# Patient Record
Sex: Female | Born: 1980 | Race: Black or African American | Hispanic: No
Health system: Southern US, Community
[De-identification: ages and names within clinical notes are randomized; demographics above are authoritative.]

## PROBLEM LIST (undated history)

## (undated) ENCOUNTER — Emergency Department (HOSPITAL_COMMUNITY): Admission: EM | Payer: Self-pay | Source: Home / Self Care

## (undated) ENCOUNTER — Ambulatory Visit (HOSPITAL_COMMUNITY): Admission: EM | Payer: Medicare Other

## (undated) ENCOUNTER — Emergency Department (HOSPITAL_COMMUNITY): Admission: EM | Discharge status: Absent without leave | Payer: Self-pay

## (undated) DIAGNOSIS — E119 Type 2 diabetes mellitus without complications: Secondary | ICD-10-CM

## (undated) DIAGNOSIS — F3111 Bipolar disorder, current episode manic without psychotic features, mild: Secondary | ICD-10-CM

## (undated) DIAGNOSIS — F209 Schizophrenia, unspecified: Secondary | ICD-10-CM

## (undated) HISTORY — PX: WISDOM TOOTH EXTRACTION: SHX21

---

## 2014-06-30 ENCOUNTER — Emergency Department (HOSPITAL_COMMUNITY)
Admission: EM | Admit: 2014-06-30 | Discharge: 2014-07-06 | Disposition: A | Discharge status: Other Acute Inpatient Hospital | Payer: Medicare Other | Attending: Emergency Medicine | Admitting: Emergency Medicine

## 2014-06-30 ENCOUNTER — Encounter (HOSPITAL_COMMUNITY): Payer: Self-pay | Admitting: Nurse Practitioner

## 2014-06-30 DIAGNOSIS — F209 Schizophrenia, unspecified: Secondary | ICD-10-CM | POA: Diagnosis not present

## 2014-06-30 DIAGNOSIS — E119 Type 2 diabetes mellitus without complications: Secondary | ICD-10-CM | POA: Diagnosis not present

## 2014-06-30 DIAGNOSIS — F259 Schizoaffective disorder, unspecified: Secondary | ICD-10-CM | POA: Diagnosis present

## 2014-06-30 DIAGNOSIS — Z3202 Encounter for pregnancy test, result negative: Secondary | ICD-10-CM | POA: Diagnosis not present

## 2014-06-30 DIAGNOSIS — R443 Hallucinations, unspecified: Secondary | ICD-10-CM

## 2014-06-30 DIAGNOSIS — Z9119 Patient's noncompliance with other medical treatment and regimen: Secondary | ICD-10-CM | POA: Insufficient documentation

## 2014-06-30 DIAGNOSIS — Z79899 Other long term (current) drug therapy: Secondary | ICD-10-CM | POA: Insufficient documentation

## 2014-06-30 DIAGNOSIS — F29 Unspecified psychosis not due to a substance or known physiological condition: Secondary | ICD-10-CM | POA: Diagnosis present

## 2014-06-30 DIAGNOSIS — F3111 Bipolar disorder, current episode manic without psychotic features, mild: Secondary | ICD-10-CM | POA: Insufficient documentation

## 2014-06-30 DIAGNOSIS — Z9114 Patient's other noncompliance with medication regimen: Secondary | ICD-10-CM

## 2014-06-30 HISTORY — DX: Schizophrenia, unspecified: F20.9

## 2014-06-30 HISTORY — DX: Type 2 diabetes mellitus without complications: E11.9

## 2014-06-30 HISTORY — DX: Bipolar disorder, current episode manic without psychotic features, mild: F31.11

## 2014-06-30 LAB — COMPREHENSIVE METABOLIC PANEL
ALBUMIN: 4.3 g/dL (ref 3.5–5.0)
ALT: 13 U/L — AB (ref 14–54)
ANION GAP: 10 (ref 5–15)
AST: 17 U/L (ref 15–41)
Alkaline Phosphatase: 48 U/L (ref 38–126)
BILIRUBIN TOTAL: 0.5 mg/dL (ref 0.3–1.2)
BUN: 6 mg/dL (ref 6–20)
CHLORIDE: 103 mmol/L (ref 101–111)
CO2: 27 mmol/L (ref 22–32)
Calcium: 9.4 mg/dL (ref 8.9–10.3)
Creatinine, Ser: 0.97 mg/dL (ref 0.44–1.00)
GFR calc Af Amer: >60 mL/min (ref >60)
GFR calc non Af Amer: >60 mL/min (ref >60)
Glucose, Bld: 121 mg/dL — ABNORMAL HIGH (ref 65–99)
Potassium: 3.2 mmol/L — ABNORMAL LOW (ref 3.5–5.1)
SODIUM: 140 mmol/L (ref 135–145)
Total Protein: 8 g/dL (ref 6.5–8.1)

## 2014-06-30 LAB — SALICYLATE LEVEL

## 2014-06-30 LAB — CBC
HEMATOCRIT: 37.3 % (ref 36.0–46.0)
HEMOGLOBIN: 12.6 g/dL (ref 12.0–15.0)
MCH: 31.3 pg (ref 26.0–34.0)
MCHC: 33.8 g/dL (ref 30.0–36.0)
MCV: 92.8 fL (ref 78.0–100.0)
Platelets: 253 10*3/uL (ref 150–400)
RBC: 4.02 MIL/uL (ref 3.87–5.11)
RDW: 13.4 % (ref 11.5–15.5)
WBC: 6.3 10*3/uL (ref 4.0–10.5)

## 2014-06-30 LAB — ETHANOL: Alcohol, Ethyl (B): <5 mg/dL (ref <5)

## 2014-06-30 LAB — RAPID URINE DRUG SCREEN, HOSP PERFORMED
Amphetamines: NOT DETECTED
BENZODIAZEPINES: NOT DETECTED
Barbiturates: NOT DETECTED
Cocaine: NOT DETECTED
Opiates: NOT DETECTED
Tetrahydrocannabinol: NOT DETECTED

## 2014-06-30 LAB — CBG MONITORING, ED: Glucose-Capillary: 105 mg/dL — ABNORMAL HIGH (ref 65–99)

## 2014-06-30 LAB — PREGNANCY, URINE: PREG TEST UR: NEGATIVE

## 2014-06-30 LAB — ACETAMINOPHEN LEVEL

## 2014-06-30 MED ORDER — DIPHENHYDRAMINE HCL 50 MG/ML IJ SOLN
25.0000 mg | Freq: Once | INTRAMUSCULAR | Status: AC
  Administered 2014-06-30: 25 mg via INTRAMUSCULAR
  Filled 2014-06-30: qty 1

## 2014-06-30 MED ORDER — ONDANSETRON HCL 4 MG PO TABS
4.0000 mg | ORAL_TABLET | Freq: Three times a day (TID) | ORAL | Status: DC | PRN
  Filled 2014-06-30: qty 1

## 2014-06-30 MED ORDER — DIPHENHYDRAMINE HCL 50 MG/ML IJ SOLN
INTRAMUSCULAR | Status: AC
  Filled 2014-06-30: qty 1

## 2014-06-30 MED ORDER — LORAZEPAM 1 MG PO TABS: 1.0000 mg | ORAL_TABLET | Freq: Once | ORAL | Status: AC

## 2014-06-30 MED ORDER — LORAZEPAM 2 MG/ML IJ SOLN
INTRAMUSCULAR | Status: AC
  Filled 2014-06-30: qty 1

## 2014-06-30 MED ORDER — HALOPERIDOL LACTATE 5 MG/ML IJ SOLN
INTRAMUSCULAR | Status: AC
  Filled 2014-06-30: qty 1

## 2014-06-30 MED ORDER — ZIPRASIDONE MESYLATE 20 MG IM SOLR
INTRAMUSCULAR | Status: AC
  Filled 2014-06-30: qty 20

## 2014-06-30 MED ORDER — ZIPRASIDONE MESYLATE 20 MG IM SOLR
10.0000 mg | Freq: Once | INTRAMUSCULAR | Status: AC
  Administered 2014-06-30: 10 mg via INTRAMUSCULAR

## 2014-06-30 MED ORDER — BENZTROPINE MESYLATE 1 MG PO TABS
0.5000 mg | ORAL_TABLET | Freq: Two times a day (BID) | ORAL | Status: DC
  Administered 2014-06-30 – 2014-07-04 (×8): 0.5 mg via ORAL
  Administered 2014-07-04: 1 mg via ORAL
  Administered 2014-07-05: 0.5 mg via ORAL
  Administered 2014-07-05: 1 mg via ORAL
  Filled 2014-06-30 (×11): qty 1

## 2014-06-30 MED ORDER — LORAZEPAM 1 MG PO TABS
1.0000 mg | ORAL_TABLET | Freq: Three times a day (TID) | ORAL | Status: DC | PRN
  Filled 2014-06-30: qty 1

## 2014-06-30 MED ORDER — ALUM & MAG HYDROXIDE-SIMETH 200-200-20 MG/5ML PO SUSP: 30.0000 mL | ORAL | Status: DC | PRN

## 2014-06-30 MED ORDER — LORAZEPAM 2 MG/ML IJ SOLN
1.0000 mg | Freq: Once | INTRAMUSCULAR | Status: AC
  Administered 2014-06-30: 1 mg via INTRAMUSCULAR

## 2014-06-30 NOTE — BH Assessment (Signed)
[place] Assessment Progress Note  At the request of Corena Pilgrim, MD, this writer attempted to call Darolyn Rua [phone]), pt's friend and pastor, to obtain collateral information.  Call was placed at 10:53 and rolled to voice mail.  A message was left.  Dr. Darleene Cleaver was notified.  Jalene Mullet, [place] Triage Specialist [phone]

## 2014-06-30 NOTE — BH Assessment (Addendum)
Tele Assessment Note   [name] [name] is an 34 y.o. female.  -Clinician discussed patient with Piepenbrink, PA regarding need for TTS.  Patient was brought in by [facility] and a friend of hers.  Patient is from [place] and is accompanied by her former Theme park manager.  Patient has been off medication for an undetermined period of time.  Was brought in to [facility] by [employer].  Patient is actively hallucinating.  Patient thought that hospital staff were part of [employer].  Clinician attempted several times to engage patient but she says only a few words that are barely above a whisper.  At one point I addressed her as "[name] [name]" and she said "I am [name] [name]."  Patient speaks so softly as to be incoherent at times.  Most of her responses are "I don't know or I don't remember."  Patient was not oriented at all.    Clinician attempted to contact Mr. Darolyn Rua [phone] whom she identified as her former Theme park manager.  Clinician only got the voice-mail for Mr. [name].  Patient did say yes when asked if she were ever in a psychiatric hospital before, did not provide any more information.  Patient is not able to make decisions for herself at this time.  Pt's UDS and BAL were negative.  When asked if she heard voices she did not respond.  When asked if she saw things she says, "I see things on the floor."  When asked for more information, she says "I can't tell you."  -Clinician discussed patient care with Arlester Marker, NP who recommends that patient be seen by psychiatry in AM.  Clinician called Dr. Dina Rich (EDP) and let her know of disposition.   Axis I: Schizoaffective Disorder Axis II: Deferred Axis III:  Past Medical History  Diagnosis Date  . Diabetes mellitus without complication   . Schizophrenia   . Bipolar affective disorder, currently manic, mild    Axis IV: economic problems, housing problems, other psychosocial or environmental problems, problems with access to health care services and problems with  primary support group Axis V: 21-30 behavior considerably influenced by delusions or hallucinations OR serious impairment in judgment, communication OR inability to function in almost all areas  Past Medical History:  Past Medical History  Diagnosis Date  . Diabetes mellitus without complication   . Schizophrenia   . Bipolar affective disorder, currently manic, mild     History reviewed. No pertinent past surgical history.  Family History: History reviewed. No pertinent family history.  Social History:  reports that she has never smoked. She does not have any smokeless tobacco history on file. She reports that she does not drink alcohol. Her drug history is not on file.  Additional Social History:  Alcohol / Drug Use Pain Medications: Pt does not remember medications. Prescriptions: Pt does not remember. Over the Counter: Unknown History of alcohol / drug use?: No history of alcohol / drug abuse (UDS and BAL are clear)  CIWA: CIWA-Ar BP: 134/78 mmHg Pulse Rate: 92 COWS:    PATIENT STRENGTHS: (choose at least two) Average or above average intelligence Physical Health  Allergies: No Known Allergies  Home Medications:  (Not in a hospital admission)  OB/GYN Status:  No LMP recorded (lmp unknown).  General Assessment Data [facility] of Assessment: [name] Assessment: In system Is this a Tele or Face-to-Face Assessment?: Face-to-Face Is this an Initial Assessment or a Re-assessment for this encounter?: Initial Assessment Marital status: Other (comment) (Pt answers, "I don't know.")  Is patient pregnant?: No Pregnancy Status: No Living Arrangements: Other (Comment) (Homeless in [place]) Can pt return to current living arrangement?: Yes Admission Status: Voluntary Is patient capable of signing voluntary admission?: No (Pt acutely psychotic) Referral Source: Other (Brought in by [employer] and a friend) Insurance type: self pay     Crisis Care Plan Living Arrangements:  Other (Comment) (Homeless in [place]) Name of Psychiatrist: None Name of Therapist: NOne  Education Status Is patient currently in school?: No  Risk to self with the past 6 months Suicidal Ideation: No Has patient been a risk to self within the past 6 months prior to admission? : No Suicidal Intent: No Has patient had any suicidal intent within the past 6 months prior to admission? : No Is patient at risk for suicide?: No Suicidal Plan?: No Has patient had any suicidal plan within the past 6 months prior to admission? : No Access to Means: No What has been your use of drugs/alcohol within the last 12 months?: Pt denies Previous Attempts/Gestures: No How many times?: 0 Other Self Harm Risks: None Triggers for Past Attempts: None known Intentional Self Injurious Behavior: None Family Suicide History: Unknown Recent stressful life event(s):  (Pt says, "I have no idea.") Persecutory voices/beliefs?: Yes Depression: Yes Depression Symptoms: Despondent, Isolating, Loss of interest in usual pleasures, Feeling worthless/self pity Substance abuse history and/or treatment for substance abuse?: No Suicide prevention information given to non-admitted patients: Not applicable  Risk to Others within the past 6 months Homicidal Ideation: No Does patient have any lifetime risk of violence toward others beyond the six months prior to admission? : No Thoughts of Harm to Others: No Current Homicidal Intent: No Current Homicidal Plan: No Access to Homicidal Means: No Identified Victim: No one History of harm to others?: No Assessment of Violence: None Noted Violent Behavior Description: Hx unknown Does patient have access to weapons?: No Criminal Charges Pending?: No Does patient have a court date: No Is patient on probation?: No  Psychosis Hallucinations: Auditory, Visual (Pt hears voices and "sees things on the floor.") Delusions: None noted  Mental Status  Report Appearance/Hygiene: Disheveled, Poor hygiene, In scrubs Eye Contact: Poor Motor Activity: Freedom of movement, Unremarkable Speech: Soft, Slow, Incoherent Level of Consciousness: Quiet/awake Mood: Depressed, Despair, Helpless, Sad Affect: Blunted, Sad Anxiety Level: None Thought Processes: Irrelevant, Flight of Ideas Judgement: Impaired Orientation: Not oriented Obsessive Compulsive Thoughts/Behaviors: Unable to Assess  Cognitive Functioning Concentration: Unable to Assess Memory: Unable to Assess IQ: Average Insight: Poor Impulse Control: Unable to Assess Appetite: Poor Weight Loss: 0 Weight Gain: 0 Sleep: Unable to Assess Total Hours of Sleep: 0 Vegetative Symptoms: Staying in bed  ADLScreening [facility] Assessment Services) Patient's cognitive ability adequate to safely complete daily activities?: Yes Patient able to express need for assistance with ADLs?: Yes Independently performs ADLs?: Yes (appropriate for developmental age)  Prior Inpatient Therapy Prior Inpatient Therapy: Yes Prior Therapy Dates: Unknown Prior Therapy Facilty/Provider(s): Unknown Reason for Treatment: Unknown  Prior Outpatient Therapy Prior Outpatient Therapy: Yes Prior Therapy Dates: Unknown Prior Therapy Facilty/Provider(s): Unknown Reason for Treatment: Unknown Does patient have an ACCT team?: No Does patient have Intensive In-House Services?  : No Does patient have [facility] services? : No Does patient have P4CC services?: No  ADL Screening (condition at time of admission) Patient's cognitive ability adequate to safely complete daily activities?: Yes Is the patient deaf or have difficulty hearing?: No Does the patient have difficulty seeing, even when wearing glasses/contacts?: No Does the patient have  difficulty concentrating, remembering, or making decisions?: Yes Patient able to express need for assistance with ADLs?: Yes Does the patient have difficulty dressing or bathing?:  No Independently performs ADLs?: Yes (appropriate for developmental age) Does the patient have difficulty walking or climbing stairs?: No Weakness of Legs: None Weakness of Arms/Hands: None       Abuse/Neglect Assessment (Assessment to be complete while patient is alone) Physical Abuse: Denies (Pt does not answer) Verbal Abuse: Denies (Pt does not answer) Sexual Abuse: Denies (Pt does not answer) Exploitation of patient/patient's resources: Denies Self-Neglect: Denies     Regulatory affairs officer (For Healthcare) Does patient have an advance directive?: No Would patient like information on creating an advanced directive?: No - patient declined information    Additional Information 1:1 In Past 12 Months?: No CIRT Risk: No Elopement Risk: No Does patient have medical clearance?: Yes     Disposition:  Disposition Initial Assessment Completed for this Encounter: Yes Disposition of Patient: Inpatient treatment program, Referred to Type of inpatient treatment program: Adult Patient referred to:  (To consult with NP)  Curlene Dolphin Ray 06/30/2014 6:13 AM

## 2014-06-30 NOTE — Care Management Note (Signed)
Case Management Note  Patient Details  Name: Dechelle Attaway MRN: [id number] Date of Birth: [birth year]  Subjective/Objective:      34 yr old self pay patient with address of [place] in [place] with [name] to start new church Pt sitting on her bed with legs crossed Ple[place] and cooperative with CM during interaction but noted to question her answers to CM after CM asked her to spell the stated pcp name States pcp is "Lenda Kelp"  Pt states pcp is "muslim" Pt appreciative of resources offered    Action/Plan: CM noted pt from [facility] in [place] with no pcp for f/u services after ED visit CM spoke with pt Cm provided resources for self pay [place] patient in case she remains in [place] discussed and provided written information for self pay pcps, discussed the importance of pcp vs EDP services for f/u care, www.needymeds.org, www.goodrx.com, discounted pharmacies and other [employer] such as [employer] [facility] [employer], affordable care act,  [place] med assist, financial assistance, self pay dental services, [place] med assist, DSS and  health department  Reviewed resources for [employer] self pay pcps like Jinny Blossom, family medicine at [employer], [facility] [place] [facility], [facility], local urgent care centers, [facility], [facility] family practice, general medical clinics, family services of the [place], [facility] urgent care plus others, medication resources, [facility] out patient pharmacies and housing Pt voiced understanding and appreciation of resources provided   Provided P4CC contact information Pt not a candidate for [facility]   Expected Discharge Date:   Pending                Expected Discharge Plan:    Pending  In-House Referral:   [facility] team  Discharge planning Services   community resources   Status of Service:   completed    Additional Comments:  Robbie Lis, RN 06/30/2014, 11:12 AM

## 2014-06-30 NOTE — BH Assessment (Signed)
[place] Assessment Progress Note  IVC paperwork has been faxed to [facility]  At 16:35 Lelon Frohlich confirmed receipt.  Jalene Mullet, [place] Triage Specialist [phone]

## 2014-06-30 NOTE — ED Notes (Signed)
Pt is presented by [employer] and caregiver who introduces himself as pastor to the pt's church and states he [employer] to scene at a [employer] after pt started "acting out." At this triage moment, pt is reporting her feet burning, people around being hurt and also having conversation with people who she states she can't identify. Caregiver reports pt has "long hx" of bipolar maniac disorder and Schizophrenia and adds that pt has not been taking her Rx medication. Of note pt and caregiver are from [facility] and state they are here to "open a church." Pt is denies voices telling her to hurt herself or anyone else.

## 2014-06-30 NOTE — BH Assessment (Signed)
[facility] Assessment Progress Note  The following facilities have been contacted to seek placement for this pt, with results as noted:  Beds available, information sent, decision pending:  [name]   At capacity:  [facility]   Catlin Doria, MA Triage Specialist [phone]     

## 2014-06-30 NOTE — BH Assessment (Addendum)
[place] Assessment Progress Note 06/30/2014-referred to [facility] with priority wt list request. Authorization # is [id number]. Gershon Mussel will fax IVC when complete.  Robinette confirmed receipt of referral, and completed clinicals. RN will CB if we are missing anything.

## 2014-06-30 NOTE — ED Provider Notes (Signed)
CSN: [id number]     Arrival date & time 06/30/14  0003 History   First MD Initiated Contact with Patient 06/30/14 0029     Chief Complaint  Patient presents with  . Hallucinations    Tactile, Auditory and Sensory     (Consider location/radiation/quality/duration/timing/severity/associated sxs/prior Treatment) HPI Comments: Pt is presented by [employer] and caregiver who introduces himself as pastor to the pt's church and states he [employer] to scene at a [employer] after pt started "acting out." Patient reporting people around being hurt and also having conversation with people who she states she can't identify. Caregiver reports pt has "long hx" of bipolar maniac disorder and Schizophrenia and adds that pt has not been taking her Rx medication. Of note pt and caregiver are from [facility] and state they are here to "open a church." Pt is denies voices telling her to hurt herself or anyone else. Patient is a level V caveat d/t psychiatric disorder.    Past Medical History  Diagnosis Date  . Diabetes mellitus without complication   . Schizophrenia   . Bipolar affective disorder, currently manic, mild    History reviewed. No pertinent past surgical history. History reviewed. No pertinent family history. History  Substance Use Topics  . Smoking status: Never Smoker   . Smokeless tobacco: Not on file  . Alcohol Use: No   OB History    No data available     Review of Systems  Unable to perform ROS: Psychiatric disorder      Allergies  Review of patient's allergies indicates no known allergies.  Home Medications   Prior to Admission medications   Medication Sig Start Date End Date Taking? Authorizing Provider  traZODone (DESYREL) 100 MG tablet Take 100 mg by mouth at bedtime.   Yes Historical Provider, MD  ziprasidone (GEODON) 20 MG capsule Take 20 mg by mouth 2 (two) times daily with a meal.   Yes Historical Provider, MD   BP 117/102 mmHg  Pulse 111  Temp(Src) 98.1 F (36.7 C)  (Oral)  Resp 18  SpO2 100%  LMP  (LMP Unknown) Physical Exam  Constitutional: She appears well-developed and well-nourished. No distress.  HENT:  Head: Normocephalic and atraumatic.  Right Ear: External ear normal.  Left Ear: External ear normal.  Nose: Nose normal.  Mouth/Throat: Oropharynx is clear and moist.  Eyes: Conjunctivae are normal.  Neck: Normal range of motion. Neck supple.  No nuchal rigidity.   Cardiovascular: Normal rate, regular rhythm and normal heart sounds.   Pulmonary/Chest: Effort normal and breath sounds normal.  Abdominal: Soft. There is no tenderness.  Musculoskeletal: Normal range of motion.  Neurological: She is alert.  Skin: Skin is warm and dry. She is not diaphoretic.  Psychiatric: She has a normal mood and affect. Her speech is tangential. Thought content is paranoid. She is inattentive.  Nursing note and vitals reviewed.   ED Course  Procedures (including critical care time) Labs Review Labs Reviewed  ACETAMINOPHEN LEVEL - Abnormal; Notable for the following:    Acetaminophen (Tylenol), Serum <10 (*)    All other components within normal limits  COMPREHENSIVE METABOLIC PANEL - Abnormal; Notable for the following:    Potassium 3.2 (*)    Glucose, Bld 121 (*)    ALT 13 (*)    All other components within normal limits  CBC  ETHANOL  SALICYLATE LEVEL  URINE RAPID DRUG SCREEN, HOSP PERFORMED  PREGNANCY, URINE    Imaging Review No results found.   EKG  Interpretation None      MDM   Final diagnoses:  Hallucinations  Non compliance w medication regimen    Filed Vitals:   06/30/14 0010  BP: 117/102  Pulse: 111  Temp: 98.1 F (36.7 C)  Resp: 18   I have reviewed nursing notes, vital signs, and all appropriate lab and imaging results if ordered as above.   Patient presenting to the emergency department with caregiver for evaluation of hallucinations with manic behavior and medication noncompliance. On examination patient is  acutely hallucinating, paranoid and anxious. She is tachycardic but examination is otherwise unremarkable. Labs reviewed without acute abnormality. CBG is noted at 121, no elevated anion gap noted. TTS consulted and patient awaits evaluation.  Baron Sane, PA-C 06/30/14 [id number]  Merryl Hacker, MD 06/30/14 [phone]

## 2014-06-30 NOTE — ED Notes (Signed)
Got report from [place]. Pt is asleep at this time. Safe environment provided. 15 mins checks continues

## 2014-06-30 NOTE — ED Notes (Signed)
D:Pt brought to [facility] confused, suspicious and guarded. Pt is responding to internal stimuli looking around and talking about her destiny, running with her brother toward a tree and then talking about going to [place]. Pt is not oriented to place and reported that she was at [facility]. A:Offered support and redirection. R:Safety maintained in the [facility].
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
## 2014-07-01 DIAGNOSIS — F209 Schizophrenia, unspecified: Secondary | ICD-10-CM | POA: Diagnosis not present

## 2014-07-01 DIAGNOSIS — F29 Unspecified psychosis not due to a substance or known physiological condition: Secondary | ICD-10-CM

## 2014-07-01 MED ORDER — POTASSIUM CHLORIDE CRYS ER 20 MEQ PO TBCR
40.0000 meq | EXTENDED_RELEASE_TABLET | Freq: Two times a day (BID) | ORAL | Status: AC
  Administered 2014-07-02: 40 meq via ORAL
  Filled 2014-07-01 (×2): qty 2

## 2014-07-01 MED ORDER — ZIPRASIDONE HCL 20 MG PO CAPS
40.0000 mg | ORAL_CAPSULE | Freq: Two times a day (BID) | ORAL | Status: DC
  Administered 2014-07-01 – 2014-07-03 (×4): 40 mg via ORAL
  Filled 2014-07-01 (×6): qty 2

## 2014-07-01 NOTE — ED Notes (Signed)
Pt. Noted sleeping in room. No complaints or concerns voiced. No distress or abnormal behavior noted. Will continue to monitor with security cameras. Q 15 minute rounds continue. 

## 2014-07-01 NOTE — ED Notes (Signed)
Pt. noted in room. No complaints or concerns voiced. No distress or abnormal behavior noted. Will continue to monitor with security cameras. Q 15 minute rounds continue.

## 2014-07-01 NOTE — ED Notes (Signed)
Pt is awake and alert, urinating in the trash can in room. Pt is not redirectable. Will continue  To monitor for safety.

## 2014-07-01 NOTE — BHH Counselor (Signed)
TC from [place] at [facility]. Pt is now on their priority wait list.   Arnold Long, [place] Therapeutic Triage Specialist
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
## 2014-07-01 NOTE — ED Notes (Signed)
Report received from [facility]. Pt. Alert in no distress denies SI, HI, AVH and pain.  Pt. Instructed to come to me with problems or concerns.Will continue to monitor for safety via security cameras and Q 15 minute checks.

## 2014-07-02 DIAGNOSIS — F209 Schizophrenia, unspecified: Secondary | ICD-10-CM | POA: Diagnosis not present

## 2014-07-02 LAB — CBG MONITORING, ED
GLUCOSE-CAPILLARY: 125 mg/dL — AB (ref 65–99)
Glucose-Capillary: 99 mg/dL (ref 65–99)

## 2014-07-02 NOTE — ED Notes (Signed)
Pt. Noted sleeping in room. No complaints or concerns voiced. No distress or abnormal behavior noted. Will continue to monitor with security cameras. Q 15 minute rounds continue. 

## 2014-07-02 NOTE — ED Notes (Signed)
Report received from [facility]. Pt. Sleeping, respirations regular and unlabored. Will continue to monitor for safety via security cameras and Q 15 minute checks.

## 2014-07-02 NOTE — ED Notes (Signed)
Pt. Noted sleeping in room. No complaints or concerns voiced. No distress or abnormal behavior noted. Will continue to monitor with security cameras. Q 15 minute rounds continue.l
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
## 2014-07-03 DIAGNOSIS — F209 Schizophrenia, unspecified: Secondary | ICD-10-CM | POA: Diagnosis not present

## 2014-07-03 DIAGNOSIS — R45851 Suicidal ideations: Secondary | ICD-10-CM

## 2014-07-03 DIAGNOSIS — F259 Schizoaffective disorder, unspecified: Secondary | ICD-10-CM | POA: Diagnosis present

## 2014-07-03 LAB — CBG MONITORING, ED
GLUCOSE-CAPILLARY: 108 mg/dL — AB (ref 65–99)
GLUCOSE-CAPILLARY: 73 mg/dL (ref 65–99)

## 2014-07-03 MED ORDER — IBUPROFEN 200 MG PO TABS
600.0000 mg | ORAL_TABLET | Freq: Four times a day (QID) | ORAL | Status: DC | PRN
  Administered 2014-07-03 – 2014-07-04 (×2): 600 mg via ORAL
  Filled 2014-07-03 (×2): qty 3

## 2014-07-03 MED ORDER — ZIPRASIDONE HCL 20 MG PO CAPS
60.0000 mg | ORAL_CAPSULE | Freq: Two times a day (BID) | ORAL | Status: DC
  Administered 2014-07-03 – 2014-07-04 (×2): 60 mg via ORAL
  Filled 2014-07-03 (×2): qty 3

## 2014-07-03 MED ORDER — OLANZAPINE 10 MG PO TBDP: 10.0000 mg | ORAL_TABLET | Freq: Three times a day (TID) | ORAL | Status: DC | PRN
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
## 2014-07-03 NOTE — ED Notes (Signed)
Pt. Noted sleeping in room. No complaints or concerns voiced. No distress or abnormal behavior noted. Will continue to monitor with security cameras. Q 15 minute rounds continue. 
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
## 2014-07-03 NOTE — BH Assessment (Signed)
[facility] Assessment Progress Note  At 09:44 Robinette at [facility] reports that pt is on their wait list as a priority referral due to aggression.  Jalene Mullet, [place] Triage Specialist [phone]

## 2014-07-03 NOTE — ED Notes (Signed)
Patient appears flat. States that she does not want to be here. Reports back pain but is unable to describe the pain. Denies SI, HI, AVH at present.   Encouragement offered. Given Motrin.   Q 15 safety checks in place.

## 2014-07-04 DIAGNOSIS — F29 Unspecified psychosis not due to a substance or known physiological condition: Secondary | ICD-10-CM | POA: Insufficient documentation

## 2014-07-04 DIAGNOSIS — F259 Schizoaffective disorder, unspecified: Secondary | ICD-10-CM

## 2014-07-04 DIAGNOSIS — Z9114 Patient's other noncompliance with medication regimen: Secondary | ICD-10-CM | POA: Insufficient documentation

## 2014-07-04 DIAGNOSIS — F209 Schizophrenia, unspecified: Secondary | ICD-10-CM | POA: Diagnosis not present

## 2014-07-04 LAB — URINALYSIS, ROUTINE W REFLEX MICROSCOPIC
Bilirubin Urine: NEGATIVE
GLUCOSE, UA: NEGATIVE mg/dL
HGB URINE DIPSTICK: NEGATIVE
Ketones, ur: NEGATIVE mg/dL
Leukocytes, UA: NEGATIVE
Nitrite: NEGATIVE
Protein, ur: NEGATIVE mg/dL
SPECIFIC GRAVITY, URINE: 1.01 (ref 1.005–1.030)
UROBILINOGEN UA: 1 mg/dL (ref 0.0–1.0)
pH: 6 (ref 5.0–8.0)

## 2014-07-04 MED ORDER — TRAZODONE HCL 50 MG PO TABS
50.0000 mg | ORAL_TABLET | Freq: Every day | ORAL | Status: DC
  Administered 2014-07-04 – 2014-07-05 (×2): 50 mg via ORAL
  Filled 2014-07-04 (×2): qty 1

## 2014-07-04 MED ORDER — ARIPIPRAZOLE 5 MG PO TABS
5.0000 mg | ORAL_TABLET | Freq: Two times a day (BID) | ORAL | Status: DC
  Administered 2014-07-04 – 2014-07-06 (×4): 5 mg via ORAL
  Filled 2014-07-04 (×6): qty 1

## 2014-07-04 NOTE — ED Notes (Signed)
Patient transferred by [place]. [employer].  Patient will be admitted to [facility].  Patient left without incident.  Discharge discussed with patient and she indicated understanding.  She denies SI/HI/AVH.

## 2014-07-04 NOTE — ED Notes (Addendum)
Patient seen by treatment team.  She stated she was from [place] and will not going back.  Patient seems to have some confusion as to why she is here. Patient is thought blocker.  She denies SI/HI/AVH.  She is compliant with her mediations.  Patient remains isolative to room.  Urine cup in room.
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
## 2014-07-04 NOTE — Progress Notes (Signed)
CSW confirmed with [name] remains on [facility] waitlist.   [name] [name], [place] [facility]  [name] [name] Emergency Department [phone]

## 2014-07-05 DIAGNOSIS — F209 Schizophrenia, unspecified: Secondary | ICD-10-CM | POA: Diagnosis not present

## 2014-07-05 NOTE — ED Notes (Signed)
Pt broke from her usual reserve when presented with Abilify. She refused, saying it keeps her "up" and would not discuss the matter further. Attempted to educate/support, but pt was emphatic.
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
## 2014-07-05 NOTE — BH Assessment (Signed)
[facility] Assessment Progress Note  Corena Pilgrim, MD, asks this writer to contact [employer] [phone]) to see if he is willing to pick this pt up once she is stabilized.  Pt has signed Consent to Release Information to Mr. [name].  He reports that he will come for the pt when the time comes.  Jalene Mullet, [facility] Triage Specialist [phone]
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
## 2014-07-05 NOTE — Progress Notes (Addendum)
Per accepting RN Opal Sidles at [facility], patient can come any time now, preferably before 19:30. RN Opal Sidles inquiring if patient can come before 19:30. Patient's nurse was informed.   [name] [name], [facility] Disposition staff 07/05/2014 6:31 PM

## 2014-07-06 DIAGNOSIS — F209 Schizophrenia, unspecified: Secondary | ICD-10-CM | POA: Diagnosis not present

## 2014-07-06 NOTE — ED Notes (Addendum)
D: Pt presents with flat affect and depressed mood. Pt d/c to [facility] as ordered. Vitals done and WNL, pt ambulatory without physical distress thus far. Pt refused to have her temperature done.  Pt denied SI, HI, VH and pain. Pt reported AH, stated "the voices just talking to me".  A: Scheduled Abilify 5 mg administered as per MD's order prior to pt's departure. D/C instructions reviewed with pt. All belongings in locker 26 and home medications (Geodon and trazodone) was given to sheriff at time of pickup. Support and encouragement offered to this pt. Report called to Marlowe Kays, RN at [facility] admitting office. Q 15 minutes checks remains effective for safety till time of departure from [facility].  R: Pt receptive to care. Cooperative with d/c order and procedure. Verbalized understanding of D/C instructions. Pt signed belonging sheet. Picked up and transported by [facility].

## 2014-12-18 ENCOUNTER — Encounter (HOSPITAL_COMMUNITY): Payer: Self-pay | Admitting: Emergency Medicine

## 2014-12-18 DIAGNOSIS — G4700 Insomnia, unspecified: Secondary | ICD-10-CM | POA: Diagnosis not present

## 2014-12-18 DIAGNOSIS — F22 Delusional disorders: Secondary | ICD-10-CM | POA: Insufficient documentation

## 2014-12-18 DIAGNOSIS — E119 Type 2 diabetes mellitus without complications: Secondary | ICD-10-CM | POA: Insufficient documentation

## 2014-12-18 DIAGNOSIS — F919 Conduct disorder, unspecified: Secondary | ICD-10-CM | POA: Diagnosis not present

## 2014-12-18 NOTE — ED Notes (Signed)
Pt. reports visual hallucinations " I think I saw an angel ..." , insomnia " can't sleep for days " and paranoia for several days , pt. Stated she has not taken her Zyprexa for several days . Denies suicidal ideation .

## 2014-12-19 ENCOUNTER — Emergency Department (HOSPITAL_COMMUNITY)
Admission: EM | Admit: 2014-12-19 | Discharge: 2014-12-19 | Discharge status: Against Medical Advice | Payer: Medicare Other | Attending: Emergency Medicine | Admitting: Emergency Medicine

## 2014-12-19 DIAGNOSIS — F919 Conduct disorder, unspecified: Secondary | ICD-10-CM | POA: Diagnosis not present

## 2014-12-19 LAB — RAPID URINE DRUG SCREEN, HOSP PERFORMED
Amphetamines: NOT DETECTED
BENZODIAZEPINES: NOT DETECTED
Barbiturates: NOT DETECTED
Cocaine: NOT DETECTED
OPIATES: NOT DETECTED
Tetrahydrocannabinol: NOT DETECTED

## 2014-12-19 LAB — CBC WITH DIFFERENTIAL/PLATELET
Basophils Absolute: 0 10*3/uL (ref 0.0–0.1)
Basophils Relative: 0 %
EOS PCT: 0 %
Eosinophils Absolute: 0 10*3/uL (ref 0.0–0.7)
HEMATOCRIT: 38.2 % (ref 36.0–46.0)
Hemoglobin: 12.8 g/dL (ref 12.0–15.0)
LYMPHS ABS: 3.2 10*3/uL (ref 0.7–4.0)
LYMPHS PCT: 55 %
MCH: 31.3 pg (ref 26.0–34.0)
MCHC: 33.5 g/dL (ref 30.0–36.0)
MCV: 93.4 fL (ref 78.0–100.0)
MONO ABS: 0.3 10*3/uL (ref 0.1–1.0)
MONOS PCT: 6 %
Neutro Abs: 2.3 10*3/uL (ref 1.7–7.7)
Neutrophils Relative %: 39 %
Platelets: 225 10*3/uL (ref 150–400)
RBC: 4.09 MIL/uL (ref 3.87–5.11)
RDW: 12.9 % (ref 11.5–15.5)
WBC: 5.8 10*3/uL (ref 4.0–10.5)

## 2014-12-19 LAB — BASIC METABOLIC PANEL
Anion gap: 8 (ref 5–15)
BUN: 7 mg/dL (ref 6–20)
CO2: 27 mmol/L (ref 22–32)
CREATININE: 0.84 mg/dL (ref 0.44–1.00)
Calcium: 9.7 mg/dL (ref 8.9–10.3)
Chloride: 104 mmol/L (ref 101–111)
GFR calc non Af Amer: >60 mL/min (ref >60)
Glucose, Bld: 121 mg/dL — ABNORMAL HIGH (ref 65–99)
POTASSIUM: 4.1 mmol/L (ref 3.5–5.1)
Sodium: 139 mmol/L (ref 135–145)

## 2014-12-19 LAB — ETHANOL

## 2014-12-19 NOTE — ED Notes (Signed)
No answer when called 2 times.

## 2015-01-03 ENCOUNTER — Emergency Department (HOSPITAL_COMMUNITY)
Admission: EM | Admit: 2015-01-03 | Discharge: 2015-01-03 | Disposition: A | Discharge status: Home / Self Care | Payer: Medicare Other | Attending: Emergency Medicine | Admitting: Emergency Medicine

## 2015-01-03 ENCOUNTER — Encounter (HOSPITAL_COMMUNITY): Payer: Self-pay | Admitting: *Deleted

## 2015-01-03 DIAGNOSIS — Z3202 Encounter for pregnancy test, result negative: Secondary | ICD-10-CM | POA: Diagnosis not present

## 2015-01-03 DIAGNOSIS — R000 Tachycardia, unspecified: Secondary | ICD-10-CM | POA: Diagnosis not present

## 2015-01-03 DIAGNOSIS — F209 Schizophrenia, unspecified: Secondary | ICD-10-CM | POA: Diagnosis not present

## 2015-01-03 DIAGNOSIS — E119 Type 2 diabetes mellitus without complications: Secondary | ICD-10-CM | POA: Diagnosis not present

## 2015-01-03 DIAGNOSIS — R350 Frequency of micturition: Secondary | ICD-10-CM | POA: Diagnosis not present

## 2015-01-03 DIAGNOSIS — T7421XA Adult sexual abuse, confirmed, initial encounter: Secondary | ICD-10-CM | POA: Diagnosis not present

## 2015-01-03 DIAGNOSIS — F3111 Bipolar disorder, current episode manic without psychotic features, mild: Secondary | ICD-10-CM | POA: Diagnosis not present

## 2015-01-03 DIAGNOSIS — Z79899 Other long term (current) drug therapy: Secondary | ICD-10-CM | POA: Diagnosis not present

## 2015-01-03 LAB — URINALYSIS, ROUTINE W REFLEX MICROSCOPIC
Bilirubin Urine: NEGATIVE
GLUCOSE, UA: NEGATIVE mg/dL
Hgb urine dipstick: NEGATIVE
Ketones, ur: 15 mg/dL — AB
LEUKOCYTES UA: NEGATIVE
Nitrite: NEGATIVE
Protein, ur: NEGATIVE mg/dL
SPECIFIC GRAVITY, URINE: 1.03 (ref 1.005–1.030)
pH: 6 (ref 5.0–8.0)

## 2015-01-03 LAB — RAPID HIV SCREEN (HIV 1/2 AB+AG)
HIV 1/2 Antibodies: NONREACTIVE
HIV-1 P24 Antigen - HIV24: NONREACTIVE

## 2015-01-03 LAB — POC URINE PREG, ED: PREG TEST UR: NEGATIVE
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
## 2015-01-03 NOTE — ED Notes (Signed)
[employer] speaking with patient.
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
## 2015-01-03 NOTE — Discharge Instructions (Signed)
Stay well hydrated. Follow up with [facility] for future STD concerns or screenings. You have been tested for gonorrhea, chlamydia, HIV, and Syphilis, and the hospital will call you if the lab is positive. Follow up with your regular doctor in one week for any ongoing concerns and recheck after today's visit. Return to the ER for changes or worsening symptoms.   Domestic Violence Information WHAT IS DOMESTIC VIOLENCE?  Domestic violence, also called intimate partner violence, can involve physical, emotional, psychological, sexual, and economic abuse by a current or former intimate partner. Stalking is also considered a type of domestic violence. Domestic violence can happen between people who are or were:   Married.  Dating.  Living together. Abusers repeatedly act to maintain control and power over their partner. Physical abuse can include:  Slapping.   Hitting.   Kicking.   Punching.  Choking.  Pulling the victim's hair.  Damaging the victim's property.  Threatening or hurting the victim with weapons.  Trapping the victim in his or her home.  Forcing the victim to use drugs or alcohol. Emotional and psychological abuse can include:  Threats.  Insults.   Isolation.   Humiliation.  Jealousy and possessiveness.  Blame.  Withholding affection.   Intimidation.   Manipulation.   Limiting contact with friends and family.  Sexual abuse can include:  Forcing sex.   Forcing sexual touching.   Hurting the victim during sex.  Forcing the victim to have sex with other people.  Giving the victim a sexually transmitted disease (STD) on purpose. Economic abuse can include:  Controlling resources, such as money, food, transportation, a phone, or computer.  Stealing money from the victim or his or her family or friends.  Forbidding the victim to work.  Refusing to work or to contribute to the  household. Stalking can include:  Making repeated, unwanted phone calls, emails, or text messages.  Leaving cards, letters, flowers or other items the victim does not want.  Watching or following the victim from a distance.  Going to places where the victim does not want the abuser.  Entering the victim's home or car.  Damaging the victim's personal property. WHAT ARE SOME WARNING SIGNS OF DOMESTIC VIOLENCE?  Physical signs  Bruises.   Broken bones.   Burns or cuts.   Physical pain.   Head injury. Emotional and psychological signs  Crying.   Depression.   Hopelessness.   Desperation.   Trouble sleeping.   Fear of partner.   Anxiety.  Suicidal behavior.  Antisocial behavior.  Low self-esteem.  Fear of intimacy.  Flashbacks. Sexual signs  Bruising, swelling, or bleeding of the genital or rectal area.   Signs of a sexually transmitted infection, such as genital sores, warts, or discharge coming from the genital area.   Pain in the genital area.   Unintended pregnancy.  Problems with pregnancy, including delayed prenatal care and prematurity. Economic signs  Having little money or food.   Homelessness.  Asking for or borrowing money. WHAT ARE COMMON BEHAVIORS OF THOSE AFFECTED BY DOMESTIC VIOLENCE? Those affected by domestic violence may:   Be late to work or other events.   Not show up to places as promised.   Have to let their partner know where they are and who they are with.   Be isolated or kept from seeing friends or family.   Make comments about their partner's temper or behavior.   Make excuses for their partner.   Engage  in high-risk sexual behaviors.  Use drugs or alcohol.  Have unhealthy diet-related behaviors. WHAT ARE COMMON FEELINGS OF THOSE AFFECTED BY DOMESTIC VIOLENCE?  Those affected by domestic violence may feel that:   They have to be careful not to say or do things that trigger their  partner's anger.   They cannot do anything right.   They deserve to be treated badly.   They are overreacting to their partner's behavior or temper.   They cannot trust their own feelings.   They cannot trust other people.   They are trapped.   Their partner would take away their children.   They are emotionally drained or numb.   Their life is in danger.   They might have to kill their partner to survive.  WHERE CAN YOU GET HELP?  Domestic violence hotlines and websites If you do not feel safe searching for help online at home, use a computer at [facility] to access [employer]. Call 911 if you are in immediate danger or need medical help.   The [employer].  The 24-hour phone hotline is 641-124-5581 or [phone]) 739-9762 (TTY).   The videophone is available Monday through Friday, 9 a.m. to 5 p.m. Call [phone].  The website is http://thehotline.org  The National Sexual Assault Hotline.  The 24-hour phone hotline is 561-175-3438.  You can access the online hotline at http://www.dixon.info/ Shelters for victims of domestic violence  If you are a victim of domestic violence, there are resources to help you find a temporary place for you and your children to live (shelter). The specific address of these shelters is often not known to the public.  Police Report assaults, threats, and stalking to the police.  Counselors and counseling centers People who have been victims of domestic violence can benefit from counseling. Counseling can help you cope with difficult emotions and empower you to plan for your future safety. The topics you discuss with a counselor are private and confidential. Children of domestic violence victims also might need counseling to manage stress and anxiety.  The court system You can work with a Chief Executive Officer or an advocate to get legal protection against an abuser. Protection includes restraining  orders and private addresses. Crimes against you, such as assault, can also be prosecuted through the courts. Laws vary by state.   This information is not intended to replace advice given to you by your health care provider. Make sure you discuss any questions you have with your health care provider.   Document Released: 03/15/2003 Document Revised: 01/13/2014 Document Reviewed: 09/09/2013 Elsevier Interactive Patient Education [employer].  Sexual Assault or Rape Sexual assault is any sexual activity that a person is forced, threatened, or coerced into participating in. It may or may not involve physical contact. You are being sexually abused if you are forced to have sexual contact of any kind. Sexual assault is called rape if penetration has occurred (vaginal, oral, or anal). Many times, sexual assaults are committed by a friend, relative, or associate. Sexual assault and rape are never the victim's fault.  Sexual assault can result in various health problems for the person who was assaulted. Some of these problems include:  Physical injuries in the genital area or other areas of the body.  Risk of unwanted pregnancy.  Risk of sexually transmitted infections (STIs).  Psychological problems such as anxiety, depression, or posttraumatic stress disorder. WHAT STEPS SHOULD BE TAKEN AFTER A SEXUAL ASSAULT? If you have been sexually assaulted,  you should take the following steps as soon as possible:  Go to a safe area as quickly as possible and call your local emergency services (911 in [place] Get away from the area where you have been attacked.   Do not wash, shower, comb your hair, or clean any part of your body.   Do not change your clothes.   Do not remove or touch anything in the area where you were assaulted.   Go to an emergency room for a complete physical exam. Get the necessary tests to protect yourself from STIs or pregnancy. You may be treated for an STI even if no  signs of one are present. Emergency contraceptive medicines are also available to help prevent pregnancy, if this is desired. You may need to be examined by a specially trained health care provider.  Have the health care provider collect evidence during the exam, even if you are not sure if you will file a report with the police.  Find out how to file the correct papers with the authorities. This is important for all assaults, even if they were committed by a family member or friend.  Find out where you can get additional help and support, such as a local rape crisis center.  Follow up with your health care provider as directed.  HOW CAN YOU REDUCE THE CHANCES OF SEXUAL ASSAULT? Take the following steps to help reduce your chances of being sexually assaulted:  Consider carrying mace or pepper spray for protection against an attacker.   Consider taking a self-defense course.  Do not try to fight off an attacker if he or she has a gun or knife.   Be aware of your surroundings, what is happening around you, and who might be there.   Be assertive, trust your instincts, and walk with confidence and direction.  Be careful not to drink too much alcohol or use other intoxicants. These can reduce your ability to fight off an assault.  Always lock your doors and windows. Be sure to have high-quality locks for your home.   Do not let people enter your house if you do not know them.   Get a home security system that has a siren if you are able.   Protect the keys to your house and car. Do not lend them out. Do not put your name and address on them. If you lose them, get your locks changed.   Always lock your car and have your key ready to open the door before approaching the car.   Park in a well-lit and busy area.  Plan your driving routes so that you travel on well-lit and frequently used streets.  Keep your car serviced. Always have at least half a tank of gas in it.   Do  not go into isolated areas alone. This includes open garages, empty buildings or offices, or [employer].   Do not walk or jog alone, especially when it is dark.   Never hitchhike.   If your car breaks down, call the police for help on your cell phone and stay inside the car with your doors locked and windows up.   If you are being followed, go to a busy area and call for help.   If you are stopped by a police officer, especially one in an unmarked police car, keep your door locked. Do not put your window down all the way. Ask the officer to show you identification first.   Be  aware of "date rape drugs" that can be placed in a drink when you are not looking. These drugs can make you unable to fight off an assault. FOR MORE INFORMATION  Office on [employer], [place] Department of Health and Human Services: JuniorPods.pl  National Sexual Assault Hotline: 1-800-656-HOPE 936-623-5006)  [place]: 1-800-799-SAFE 770-054-0376) or www.thehotline.org   This information is not intended to replace advice given to you by your health care provider. Make sure you discuss any questions you have with your health care provider.   Document Released: 12/21/1999 Document Revised: 08/25/2012 Document Reviewed: 07/28/2014 Elsevier Interactive Patient Education 2016 [employer].  Urinary Frequency The number of times a normal person urinates depends upon how much liquid they take in and how much liquid they are losing. If the temperature is hot and there is high humidity, then the person will sweat more and usually breathe a little more frequently. These factors decrease the amount of frequency of urination that would be considered normal. The amount you drink is easily determined, but the amount of fluid lost is sometimes more difficult to calculate.  Fluid is lost in two ways:  Sensible fluid loss is  usually measured by the amount of urine that you get rid of. Losses of fluid can also occur with diarrhea.  Insensible fluid loss is more difficult to measure. It is caused by evaporation. Insensible loss of fluid occurs through breathing and sweating. It usually ranges from a little less than a quart to a little more than a quart of fluid a day. In normal temperatures and activity levels, the average person may urinate 4 to 7 times in a 24-hour period. Needing to urinate more often than that could indicate a problem. If one urinates 4 to 7 times in 24 hours and has large volumes each time, that could indicate a different problem from one who urinates 4 to 7 times a day and has small volumes. The time of urinating is also important. Most urinating should be done during the waking hours. Getting up at night to urinate frequently can indicate some problems. CAUSES  The bladder is the organ in your lower abdomen that holds urine. Like a balloon, it swells some as it fills up. Your nerves sense this and tell you it is time to head for the bathroom. There are a number of reasons that you might feel the need to urinate more often than usual. They include:  Urinary tract infection. This is usually associated with other signs such as burning when you urinate.  In men, problems with the prostate (a walnut-size gland that is located near the tube that carries urine out of your body). There are two reasons why the prostate can cause an increased frequency of urination:  An enlarged prostate that does not let the bladder empty well. If the bladder only half empties when you urinate, then it only has half the capacity to fill before you have to urinate again.  The nerves in the bladder become more hypersensitive with an increased size of the prostate even if the bladder empties completely.  Pregnancy.  Obesity. Excess weight is more likely to cause a problem for women than for men.  Bladder stones or other  bladder problems.  Caffeine.  Alcohol.  Medications. For example, drugs that help the body get rid of extra fluid (diuretics) increase urine production. Some other medicines must be taken with lots of fluids.  Muscle or nerve weakness. This might be the result  of a spinal cord injury, a stroke, multiple sclerosis, or Parkinson disease.  Long-standing diabetes can decrease the sensation of the bladder. This loss of sensation makes it harder to sense the bladder needs to be emptied. Over a period of years, the bladder is stretched out by constant overfilling. This weakens the bladder muscles so that the bladder does not empty well and has less capacity to fill with new urine.  Interstitial cystitis (also called painful bladder syndrome). This condition develops because the tissues that line the inside of the bladder are inflamed (inflammation is the body's way of reacting to injury or infection). It causes pain and frequent urination. It occurs in women more often than in men. DIAGNOSIS   To decide what might be causing your urinary frequency, your health care provider will probably:  Ask about symptoms you have noticed.  Ask about your overall health. This will include questions about any medications you are taking.  Do a physical examination.  Order some tests. These might include:  A blood test to check for diabetes or other health issues that could be contributing to the problem.  Urine testing. This could measure the flow of urine and the pressure on the bladder.  A test of your neurological system (the brain, spinal cord, and nerves). This is the system that senses the need to urinate.  A bladder test to check whether it is emptying completely when you urinate.  Cystoscopy. This test uses a thin tube with a tiny camera on it. It offers a look inside your urethra and bladder to see if there are problems.  Imaging tests. You might be given a contrast dye and then asked to  urinate. X-rays are taken to see how your bladder is working. TREATMENT  It is important for you to be evaluated to determine if the amount or frequency that you have is unusual or abnormal. If it is found to be abnormal, the cause should be determined and this can usually be found out easily. Depending upon the cause, treatment could include medication, stimulation of the nerves, or surgery. There are not too many things that you can do as an individual to change your urinary frequency. It is important that you balance the amount of fluid intake needed to compensate for your activity and the temperature. Medical problems will be diagnosed and taken care of by your physician. There is no particular bladder training such as Kegel exercises that you can do to help urinary frequency. This is an exercise that is usually recommended for people who have leaking of urine when they laugh, cough, or sneeze. HOME CARE INSTRUCTIONS   Take any medications your health care provider prescribed or suggested. Follow the directions carefully.  Practice any lifestyle changes that are recommended. These might include:  Drinking less fluid or drinking at different times of the day. If you need to urinate often during the night, for example, you may need to stop drinking fluids early in the evening.  Cutting down on caffeine or alcohol. They both can make you need to urinate more often than normal. Caffeine is found in coffee, tea, and sodas.  Losing weight, if that is recommended.  Keep a journal or a log. You might be asked to record how much you drink and when and where you feel the need to urinate. This will also help evaluate how well the treatment provided by your physician is working. SEEK MEDICAL CARE IF:   Your need to urinate often gets  worse.  You feel increased pain or irritation when you urinate.  You notice blood in your urine.  You have questions about any medications that your health care  provider recommended.  You notice blood, pus, or swelling at the site of any test or treatment procedure.  You develop a fever of more than 100.45F (38.1C). SEEK IMMEDIATE MEDICAL CARE IF:  You develop a fever of more than 102.30F (38.9C).   This information is not intended to replace advice given to you by your health care provider. Make sure you discuss any questions you have with your health care provider.   Document Released: 10/19/2008 Document Revised: 01/13/2014 Document Reviewed: 10/19/2008 Elsevier Interactive Patient Education [employer].

## 2015-01-04 LAB — RPR: RPR Ser Ql: NONREACTIVE

## 2015-01-14 ENCOUNTER — Emergency Department (HOSPITAL_COMMUNITY)
Admission: EM | Admit: 2015-01-14 | Discharge: 2015-01-14 | Discharge status: Against Medical Advice | Payer: Medicare Other | Attending: Emergency Medicine | Admitting: Emergency Medicine

## 2015-01-14 ENCOUNTER — Encounter (HOSPITAL_COMMUNITY): Payer: Self-pay | Admitting: Nurse Practitioner

## 2015-01-14 DIAGNOSIS — F3111 Bipolar disorder, current episode manic without psychotic features, mild: Secondary | ICD-10-CM | POA: Diagnosis not present

## 2015-01-14 DIAGNOSIS — Z760 Encounter for issue of repeat prescription: Secondary | ICD-10-CM | POA: Insufficient documentation

## 2015-01-14 DIAGNOSIS — G4700 Insomnia, unspecified: Secondary | ICD-10-CM | POA: Insufficient documentation

## 2015-01-14 DIAGNOSIS — F250 Schizoaffective disorder, bipolar type: Secondary | ICD-10-CM | POA: Diagnosis not present

## 2015-01-14 DIAGNOSIS — F600 Paranoid personality disorder: Secondary | ICD-10-CM | POA: Diagnosis present

## 2015-01-14 DIAGNOSIS — E119 Type 2 diabetes mellitus without complications: Secondary | ICD-10-CM | POA: Diagnosis not present

## 2015-01-14 DIAGNOSIS — R451 Restlessness and agitation: Secondary | ICD-10-CM | POA: Diagnosis not present
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
## 2015-01-14 NOTE — ED Notes (Addendum)
Pt states that her medication changes involving trazodone, Zyprexa and Geodon has made her have paranoid, she adds that her depression id getting out of control and is worried she might get maniac. She denies SI or HI, she wants to be put back on Geodon, and her trazodone to be decreased to 50 mg to be able to sleep.

## 2015-01-14 NOTE — BH Assessment (Signed)
Assessment completed. Consulted Arlester Marker, NP who recommended inpatient treatment for medication adjustment. Informed Antonietta Breach, PA-C of recommendation.

## 2015-01-14 NOTE — ED Notes (Signed)
Pt is leaving AMA, states she needed an outside referral for a doctor to change her medications and has decided to go to [facility]. I have notified assigned EDP.

## 2015-01-14 NOTE — ED Provider Notes (Signed)
CSN: [id number]     Arrival date & time 01/14/15  0001 History   First MD Initiated Contact with Patient 01/14/15 0011     Chief Complaint  Patient presents with  . Paranoid  . Medication Dose Change     (Consider location/radiation/quality/duration/timing/severity/associated sxs/prior Treatment) HPI Comments:   Patient is a 35 year old female with a history of diabetes mellitus, bipolar disorder, schizophrenia who presents to the emergency department for further psychiatric evaluation. Patient is requesting that she have have her medications changed. Patient reports that she is taking trazodone and Zyprexa which have been making her paranoid. She is complaining of insomnia over the past 2 days. She reports stopping her dose of melatonin one week ago. At this time she started drinking more hot chocolate during the evening. She noticed that this caused her to feel more awake. She took one dose of melatonin 2 days ago he notice that it did not help her symptoms at all. She reports in triage that her depression is getting out of control and she is worried that she may get manic. She denies SI/HI at this time. She reports moving to the area from [place]. She has not made contact with any psychiatrist since her move. She is wanting to be put back on Geodon and have alterations to her current trazodone dose.  The history is provided by the patient. No language interpreter was used.    Past Medical History  Diagnosis Date  . Diabetes mellitus without complication ([place])   . Schizophrenia ([place])   . Bipolar affective disorder, currently manic, mild ([place])    History reviewed. No pertinent past surgical history. History reviewed. No pertinent family history. Social History  Substance Use Topics  . Smoking status: Never Smoker   . Smokeless tobacco: None  . Alcohol Use: No   OB History    No data available      Review of Systems  Psychiatric/Behavioral: Positive for behavioral problems,  sleep disturbance and agitation. Negative for suicidal ideas.  All other systems reviewed and are negative.   Allergies  Geodon capsules  Home Medications   Prior to Admission medications   Medication Sig Start Date End Date Taking? Authorizing Provider  divalproex (DEPAKOTE ER) 500 MG 24 hr tablet Take 1,000 mg by mouth at bedtime. 12/14/14  Yes Historical Provider, MD  OLANZapine (ZYPREXA) 10 MG tablet Take 10 mg by mouth at bedtime. 12/14/14  Yes Historical Provider, MD   BP 119/89 mmHg  Pulse 108  Temp(Src) 98.7 F (37.1 C) (Oral)  Resp 15  SpO2 98%   Physical Exam  Constitutional: She is oriented to person, place, and time. She appears well-developed and well-nourished. No distress.  HENT:  Head: Normocephalic and atraumatic.  Eyes: Conjunctivae and EOM are normal. No scleral icterus.  Neck: Normal range of motion.  Pulmonary/Chest: Effort normal. No respiratory distress.  Musculoskeletal: Normal range of motion.  Neurological: She is alert and oriented to person, place, and time. She exhibits normal muscle tone. Coordination normal.  Skin: Skin is warm and dry. No rash noted. She is not diaphoretic. No erythema. No pallor.  Psychiatric: She has a normal mood and affect. Her behavior is normal. Her speech is rapid and/or pressured. She expresses no homicidal and no suicidal ideation.  Nursing note and vitals reviewed.   ED Course  Procedures (including critical care time) Labs Review Labs Reviewed  CBC WITH DIFFERENTIAL/PLATELET  COMPREHENSIVE METABOLIC PANEL  ETHANOL  URINE RAPID DRUG SCREEN, HOSP PERFORMED  PREGNANCY,  URINE    Imaging Review No results found.   I have personally reviewed and evaluated these images and lab results as part of my medical decision-making.   EKG Interpretation None      MDM   Final diagnoses:  Schizoaffective disorder, bipolar type ([place])    35 year old female presents to the Emergency Department requesting changes to her  current psychiatric medications. She was evaluated by TTS and recommended for inpatient treatment. Patient denied suicidal and homicidal ideations. She does not appear to be a threat to herself or others. She stated that she was tired of waiting in the emergency department and she "only wanted a referral". Patient left the ED AGAINST MEDICAL ADVICE.   Filed Vitals:   01/14/15 0014  BP: 119/89  Pulse: 108  Temp: 98.7 F (37.1 C)  TempSrc: Oral  Resp: 15  SpO2: 98%     Antonietta Breach, PA-C 01/14/15 0236  Orpah Greek, MD 01/14/15 [phone]

## 2015-01-27 ENCOUNTER — Telehealth: Payer: Self-pay

## 2015-01-27 NOTE — Telephone Encounter (Signed)
TC from client after referral from [facility], a member at [employer].  [name] [name] called to ask for help. She came to [place] from [place].  She needed medication.  She reported history of Bipolar disorder.  She has been to [facility], in [place], but feels like the medication she is on is "making me worse"  She was taking Zyprexa 10 mg hs, depakote 1000mg  hs and melatonin 3 mg. Hs.  She has no refills on her medications.  She reports that she has been out of meds for several days.  She reports she has not been able to sleep and has had hallucinations and been praying all night.  She seemed to have pressured speech, and jumped from one subject to another.  She has no transportation or money for transportation or meds.  She can get meds for $3.00.  Client reports she has been to [facility] in the past.  Since she has not refills, we made plans for her to go to [facility] on Monday.  I will make a home visit on Sunday to provide monies from church fund to get transportation and meds on Monday.  She has agreed to call me or go to the emergency room if she feels unable to wait til Monday.

## 2015-01-28 ENCOUNTER — Telehealth: Payer: Self-pay

## 2015-01-28 ENCOUNTER — Encounter (HOSPITAL_COMMUNITY): Payer: Self-pay | Admitting: Oncology

## 2015-01-28 ENCOUNTER — Emergency Department (HOSPITAL_COMMUNITY)
Admission: EM | Admit: 2015-01-28 | Discharge: 2015-01-29 | Disposition: A | Discharge status: Other Acute Inpatient Hospital | Payer: Medicare Other | Attending: Emergency Medicine | Admitting: Emergency Medicine

## 2015-01-28 DIAGNOSIS — Z79899 Other long term (current) drug therapy: Secondary | ICD-10-CM | POA: Insufficient documentation

## 2015-01-28 DIAGNOSIS — F250 Schizoaffective disorder, bipolar type: Secondary | ICD-10-CM

## 2015-01-28 DIAGNOSIS — F3111 Bipolar disorder, current episode manic without psychotic features, mild: Secondary | ICD-10-CM | POA: Insufficient documentation

## 2015-01-28 DIAGNOSIS — F419 Anxiety disorder, unspecified: Secondary | ICD-10-CM | POA: Diagnosis not present

## 2015-01-28 DIAGNOSIS — E119 Type 2 diabetes mellitus without complications: Secondary | ICD-10-CM | POA: Diagnosis not present

## 2015-01-28 DIAGNOSIS — F22 Delusional disorders: Secondary | ICD-10-CM | POA: Diagnosis present

## 2015-01-28 LAB — CBC
HCT: 36.2 % (ref 36.0–46.0)
Hemoglobin: 12.3 g/dL (ref 12.0–15.0)
MCH: 30.8 pg (ref 26.0–34.0)
MCHC: 34 g/dL (ref 30.0–36.0)
MCV: 90.5 fL (ref 78.0–100.0)
PLATELETS: 212 10*3/uL (ref 150–400)
RBC: 4 MIL/uL (ref 3.87–5.11)
RDW: 13 % (ref 11.5–15.5)
WBC: 6.3 10*3/uL (ref 4.0–10.5)

## 2015-01-28 LAB — I-STAT TROPONIN, ED
Troponin i, poc: 0.01 ng/mL (ref 0.00–0.08)
Troponin i, poc: 0 ng/mL (ref 0.00–0.08)
Troponin i, poc: 0 ng/mL (ref 0.00–0.08)

## 2015-01-28 LAB — URINALYSIS, ROUTINE W REFLEX MICROSCOPIC
BILIRUBIN URINE: NEGATIVE
GLUCOSE, UA: NEGATIVE mg/dL
Ketones, ur: NEGATIVE mg/dL
Leukocytes, UA: NEGATIVE
Nitrite: NEGATIVE
PROTEIN: NEGATIVE mg/dL
Specific Gravity, Urine: 1.003 — ABNORMAL LOW (ref 1.005–1.030)
pH: 7 (ref 5.0–8.0)

## 2015-01-28 LAB — URINE MICROSCOPIC-ADD ON
BACTERIA UA: NONE SEEN
RBC / HPF: NONE SEEN RBC/hpf (ref 0–5)

## 2015-01-28 LAB — COMPREHENSIVE METABOLIC PANEL
ALBUMIN: 4.9 g/dL (ref 3.5–5.0)
ALK PHOS: 50 U/L (ref 38–126)
ALT: 15 U/L (ref 14–54)
ANION GAP: 10 (ref 5–15)
AST: 26 U/L (ref 15–41)
BUN: 14 mg/dL (ref 6–20)
CALCIUM: 9.8 mg/dL (ref 8.9–10.3)
CO2: 26 mmol/L (ref 22–32)
CREATININE: 0.84 mg/dL (ref 0.44–1.00)
Chloride: 103 mmol/L (ref 101–111)
Glucose, Bld: 135 mg/dL — ABNORMAL HIGH (ref 65–99)
Potassium: 3.5 mmol/L (ref 3.5–5.1)
Sodium: 139 mmol/L (ref 135–145)
TOTAL PROTEIN: 8.9 g/dL — AB (ref 6.5–8.1)
Total Bilirubin: 0.5 mg/dL (ref 0.3–1.2)

## 2015-01-28 LAB — ETHANOL: Alcohol, Ethyl (B): <5 mg/dL

## 2015-01-28 LAB — RAPID URINE DRUG SCREEN, HOSP PERFORMED
Amphetamines: NOT DETECTED
BENZODIAZEPINES: NOT DETECTED
Barbiturates: NOT DETECTED
COCAINE: NOT DETECTED
OPIATES: NOT DETECTED
Tetrahydrocannabinol: NOT DETECTED

## 2015-01-28 LAB — CBG MONITORING, ED: Glucose-Capillary: 85 mg/dL (ref 65–99)

## 2015-01-28 MED ORDER — ZIPRASIDONE HCL 20 MG PO CAPS
60.0000 mg | ORAL_CAPSULE | Freq: Two times a day (BID) | ORAL | Status: DC
  Administered 2015-01-28: 40 mg via ORAL
  Administered 2015-01-29: 60 mg via ORAL
  Filled 2015-01-28 (×2): qty 3

## 2015-01-28 MED ORDER — AMANTADINE HCL 100 MG PO CAPS
100.0000 mg | ORAL_CAPSULE | Freq: Two times a day (BID) | ORAL | Status: DC
  Administered 2015-01-28 – 2015-01-29 (×3): 100 mg via ORAL
  Filled 2015-01-28 (×4): qty 1

## 2015-01-28 MED ORDER — LORAZEPAM 1 MG PO TABS
1.0000 mg | ORAL_TABLET | Freq: Two times a day (BID) | ORAL | Status: DC
  Administered 2015-01-28 – 2015-01-29 (×3): 1 mg via ORAL
  Filled 2015-01-28: qty 1

## 2015-01-28 MED ORDER — IBUPROFEN 200 MG PO TABS: 600.0000 mg | ORAL_TABLET | Freq: Three times a day (TID) | ORAL | Status: DC | PRN

## 2015-01-28 MED ORDER — ACETAMINOPHEN 325 MG PO TABS: 650.0000 mg | ORAL_TABLET | ORAL | Status: DC | PRN

## 2015-01-28 MED ORDER — OLANZAPINE 10 MG PO TABS: 10.0000 mg | ORAL_TABLET | Freq: Every day | ORAL | Status: DC

## 2015-01-28 MED ORDER — ONDANSETRON HCL 4 MG PO TABS: 4.0000 mg | ORAL_TABLET | Freq: Three times a day (TID) | ORAL | Status: DC | PRN

## 2015-01-28 MED ORDER — DIVALPROEX SODIUM ER 500 MG PO TB24
1000.0000 mg | ORAL_TABLET | Freq: Every day | ORAL | Status: DC
  Administered 2015-01-28: 1000 mg via ORAL
  Filled 2015-01-28: qty 2

## 2015-01-28 MED ORDER — MIRTAZAPINE 30 MG PO TABS
15.0000 mg | ORAL_TABLET | Freq: Once | ORAL | Status: AC
  Administered 2015-01-28: 15 mg via ORAL
  Filled 2015-01-28: qty 1

## 2015-01-28 MED ORDER — ALUM & MAG HYDROXIDE-SIMETH 200-200-20 MG/5ML PO SUSP: 30.0000 mL | ORAL | Status: DC | PRN

## 2015-01-28 MED ORDER — NICOTINE 21 MG/24HR TD PT24
21.0000 mg | MEDICATED_PATCH | Freq: Every day | TRANSDERMAL | Status: DC
  Filled 2015-01-28 (×2): qty 1

## 2015-01-28 MED ORDER — LORAZEPAM 1 MG PO TABS
1.0000 mg | ORAL_TABLET | Freq: Three times a day (TID) | ORAL | Status: DC | PRN
  Administered 2015-01-28: 1 mg via ORAL
  Filled 2015-01-28 (×3): qty 1

## 2015-01-28 NOTE — Progress Notes (Signed)
Disposition CSW completed patient referrals to the following inpatient psych facilities:  [facility]  CSW will continue to follow patient for placement needs.  [facility] Disposition CSW [phone]

## 2015-01-28 NOTE — BH Assessment (Signed)
Assessment completed. Consulted Darlyne Russian, PA-C who recommended inpatient treatment. TTS to seek placement. Informed Dr. Zenia Resides of the recommendation.

## 2015-01-28 NOTE — ED Provider Notes (Signed)
CSN: [id number]     Arrival date & time 01/28/15  0207 History   By signing my name below, I, Forrestine Him, attest that this documentation has been prepared under the direction and in the presence of Lacretia Leigh, MD.  Electronically Signed: Forrestine Him, ED Scribe. 01/28/2015. 2:50 AM.   Chief Complaint  Patient presents with  . Insomnia   The history is provided by the patient. No language interpreter was used.   HPI Comments: [name] [name] is a 35 y.o. female with a PMHx of Schizophrenia, Bipolar affective disorder who presents to the Emergency Department complaining of trouble sleeping onset last night. Pt states it has been difficult for her to get a good nights rest in the last few days. Pt reports associated auditory hallucinations as she believes the home she is currently staying in is possessed. She also reports anxiety, paranoia, and states her "bipolar" has progressively worsened in the last few days. She states she ran out of her Zyprexa medication two days ago, which she has been taking for the past year. Pt is requesting admission today as she feels she needs help. Pt was previously admitted to a hospital program in [place], [place] but is unsure of date. She asks for Trazodone in place of Zyprexa as she has had success on this medication previously. Pt denies and SI or HI.  Past Medical History  Diagnosis Date  . Diabetes mellitus without complication ([place])   . Schizophrenia ([place])   . Bipolar affective disorder, currently manic, mild ([place])    History reviewed. No pertinent past surgical history. No family history on file. Social History  Substance Use Topics  . Smoking status: Never Smoker   . Smokeless tobacco: None  . Alcohol Use: No   OB History    No data available     Review of Systems  Constitutional: Negative for fever and chills.  Gastrointestinal: Negative for nausea and vomiting.  Psychiatric/Behavioral: Positive for hallucinations (auditory) and sleep  disturbance. Negative for suicidal ideas. The patient is nervous/anxious.    Allergies  Geodon capsules  Home Medications   Prior to Admission medications   Medication Sig Start Date End Date Taking? Authorizing Provider  divalproex (DEPAKOTE ER) 500 MG 24 hr tablet Take 1,000 mg by mouth at bedtime. 12/14/14   Historical Provider, MD  OLANZapine (ZYPREXA) 10 MG tablet Take 10 mg by mouth at bedtime. 12/14/14   Historical Provider, MD   BP 155/89 mmHg  Pulse 111  Temp(Src) 98.3 F (36.8 C) (Oral)  Resp 18  SpO2 99%  LMP 01/25/2015 (Approximate) Physical Exam  Constitutional: She is oriented to person, place, and time. She appears well-developed and well-nourished.  Non-toxic appearance. No distress.  HENT:  Head: Normocephalic and atraumatic.  Eyes: Conjunctivae, EOM and lids are normal. Pupils are equal, round, and reactive to light.  Neck: Normal range of motion. Neck supple. No tracheal deviation present. No thyroid mass present.  Cardiovascular: Normal rate, regular rhythm and normal heart sounds.  Exam reveals no gallop.   No murmur heard. Pulmonary/Chest: Effort normal and breath sounds normal. No stridor. No respiratory distress. She has no decreased breath sounds. She has no wheezes. She has no rhonchi. She has no rales.  Abdominal: Soft. Normal appearance and bowel sounds are normal. She exhibits no distension. There is no tenderness. There is no rebound and no CVA tenderness.  Musculoskeletal: Normal range of motion. She exhibits no edema or tenderness.  Neurological: She is alert and oriented to person,  place, and time. She has normal strength. No cranial nerve deficit or sensory deficit. GCS eye subscore is 4. GCS verbal subscore is 5. GCS motor subscore is 6.  Skin: Skin is warm and dry. No abrasion and no rash noted.  Psychiatric: Her mood appears anxious. Her speech is rapid and/or pressured. She is hyperactive. Thought content is paranoid and delusional. She expresses no  suicidal ideation. She expresses no suicidal plans.  Nursing note and vitals reviewed.  ED Course  Procedures  DIAGNOSTIC STUDIES: Oxygen Saturation is 99% on RA, normal by my interpretation.    COORDINATION OF CARE:  2:40 AM- Will order lab work and urine rapid drug screen. Will arrange for a consult with TTS. Will administer medications. Discussed treatment plan with pt at bedside and pt agreed to plan.   Labs Review Labs Reviewed  COMPREHENSIVE METABOLIC PANEL  ETHANOL  CBC  URINE RAPID DRUG SCREEN, HOSP PERFORMED  I-STAT BETA HCG BLOOD, ED ([facility], [facility], [facility] ONLY)    Imaging Review No results found. I have personally reviewed and evaluated these images and lab results as part of my medical decision-making.   EKG Interpretation None      MDM   Final diagnoses:  None    I personally performed the services described in this documentation, which was scribed in my presence. The recorded information has been reviewed and is accurate.   Patient medically cleared and has been seen by behavior health who has recommended inpatient hospitalization  Lacretia Leigh, MD 01/28/15 [phone]
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
## 2015-01-28 NOTE — Consult Note (Signed)
[name] [name] Psychiatry Consult   Reason for Consult:  Insomnia, disorganized speech, Referring Physician:  EDP Patient Identification: [name] [name] MRN:  [id number] Principal Diagnosis: Schizoaffective disorder, bipolar type [name]) Diagnosis:   Patient Active Problem List   Diagnosis Date Noted  . Schizoaffective disorder, bipolar type ([place]) [F25.0] 01/28/2015    Priority: High  . Psychoses [F29]   . Non compliance w medication regimen [Z91.14]   . Schizoaffective disorder ([place]) [F25.9] 07/03/2014  . Hallucinations [R44.3]     Total Time spent with patient: 45 minutes  Subjective:   [name] [name] is a 35 y.o. female patient admitted with Insomnia, disorganized speech,.  HPI:  AA female, 35 years old was evaluated for manic symptoms.  Patient's speech is pressured, her thought and speech is disorganized as she states she is a prophet.  Patient rep[orts that she is reading the Bible because the end of time is closer.   Patient reports that she was taking Zyprexa but does not believe that his Zyprexa is effective.  Patient has not slept in 2 days as documented.  Patient reports today that she does not get enough sleep and states that her appetite is poor.  Patient denies SI/HI/AVH.  She has been accepted for admission and we will be seeking placement at any facility with available bed.  Past Psychiatric History:  Schizoaffective disorder, Psychosis,   Risk to Self: Suicidal Ideation: No Suicidal Intent: No Is patient at risk for suicide?: No Suicidal Plan?: No Access to Means: No What has been your use of drugs/alcohol within the last 12 months?: Pt denies  How many times?: 0 Other Self Harm Risks: Pt denies  Triggers for Past Attempts: None known Intentional Self Injurious Behavior: None Risk to Others: Homicidal Ideation: No Thoughts of Harm to Others: No Current Homicidal Intent: No Current Homicidal Plan: No Access to Homicidal Means: No Identified Victim:  N/A History of harm to others?: No Assessment of Violence: None Noted Violent Behavior Description: No violent behaviors observed. Pt is calm and cooperative.  Does patient have access to weapons?: No Criminal Charges Pending?: No Does patient have a court date: No Prior Inpatient Therapy: Prior Inpatient Therapy: Yes Prior Therapy Dates: 06/2014 Prior Therapy Facilty/Provider(s): [facility] Reason for Treatment: Schizoaffective Disorder  Prior Outpatient Therapy: Prior Outpatient Therapy: No Does patient have an ACCT team?: No Does patient have Intensive In-House Services?  : No Does patient have [facility] services? : No Does patient have P4CC services?: No  Past Medical History:  Past Medical History  Diagnosis Date  . Diabetes mellitus without complication ([place])   . Schizophrenia ([place])   . Bipolar affective disorder, currently manic, mild ([place])    History reviewed. No pertinent past surgical history. Family History: No family history on file.   Family Psychiatric  History:  Unknown Social History:  History  Alcohol Use No     History  Drug Use Not on file    Social History   Social History  . Marital Status: Legally Separated    Spouse Name: N/A  . Number of Children: N/A  . Years of Education: N/A   Social History Main Topics  . Smoking status: Never Smoker   . Smokeless tobacco: None  . Alcohol Use: No  . Drug Use: None  . Sexual Activity: Not Asked   Other Topics Concern  . None   Social History Narrative   Additional Social History:    History of alcohol / drug use?: No history of alcohol /  drug abuse                     Allergies:  No Active Allergies  Labs:  Results for orders placed or performed during the hospital encounter of 01/28/15 (from the past 48 hour(s))  Urine rapid drug screen (hosp performed) (Not at [facility])     Status: None   Collection Time: 01/28/15  2:30 AM  Result Value Ref Range   Opiates NONE DETECTED NONE DETECTED    Cocaine NONE DETECTED NONE DETECTED   Benzodiazepines NONE DETECTED NONE DETECTED   Amphetamines NONE DETECTED NONE DETECTED   Tetrahydrocannabinol NONE DETECTED NONE DETECTED   Barbiturates NONE DETECTED NONE DETECTED    Comment:        DRUG SCREEN FOR MEDICAL PURPOSES ONLY.  IF CONFIRMATION IS NEEDED FOR ANY PURPOSE, NOTIFY LAB WITHIN 5 DAYS.        LOWEST DETECTABLE LIMITS FOR URINE DRUG SCREEN Drug Class       Cutoff (ng/mL) Amphetamine      1000 Barbiturate      200 Benzodiazepine   161 Tricyclics       096 Opiates          300 Cocaine          300 THC              50   Comprehensive metabolic panel     Status: Abnormal   Collection Time: 01/28/15  2:52 AM  Result Value Ref Range   Sodium 139 135 - 145 mmol/L   Potassium 3.5 3.5 - 5.1 mmol/L   Chloride 103 101 - 111 mmol/L   CO2 26 22 - 32 mmol/L   Glucose, Bld 135 (H) 65 - 99 mg/dL   BUN 14 6 - 20 mg/dL   Creatinine, Ser 0.84 0.44 - 1.00 mg/dL   Calcium 9.8 8.9 - 10.3 mg/dL   Total Protein 8.9 (H) 6.5 - 8.1 g/dL   Albumin 4.9 3.5 - 5.0 g/dL   AST 26 15 - 41 U/L   ALT 15 14 - 54 U/L   Alkaline Phosphatase 50 38 - 126 U/L   Total Bilirubin 0.5 0.3 - 1.2 mg/dL   GFR calc non Af Amer >60 >60 mL/min   GFR calc Af Amer >60 >60 mL/min    Comment: (NOTE) The eGFR has been calculated using the CKD EPI equation. This calculation has not been validated in all clinical situations. eGFR's persistently <60 mL/min signify possible Chronic Kidney Disease.    Anion gap 10 5 - 15  Ethanol (ETOH)     Status: None   Collection Time: 01/28/15  2:52 AM  Result Value Ref Range   Alcohol, Ethyl (B) <5 <5 mg/dL    Comment:        LOWEST DETECTABLE LIMIT FOR SERUM ALCOHOL IS 5 mg/dL FOR MEDICAL PURPOSES ONLY   CBC     Status: None   Collection Time: 01/28/15  2:52 AM  Result Value Ref Range   WBC 6.3 4.0 - 10.5 K/uL   RBC 4.00 3.87 - 5.11 MIL/uL   Hemoglobin 12.3 12.0 - 15.0 g/dL   HCT 36.2 36.0 - 46.0 %   MCV 90.5 78.0 -  100.0 fL   MCH 30.8 26.0 - 34.0 pg   MCHC 34.0 30.0 - 36.0 g/dL   RDW 13.0 11.5 - 15.5 %   Platelets 212 150 - 400 K/uL  Urinalysis, Routine w reflex microscopic (not at [facility])  Status: Abnormal   Collection Time: 01/28/15 12:01 PM  Result Value Ref Range   Color, Urine YELLOW YELLOW   APPearance CLEAR CLEAR   Specific Gravity, Urine 1.003 (L) 1.005 - 1.030   pH 7.0 5.0 - 8.0   Glucose, UA NEGATIVE NEGATIVE mg/dL   Hgb urine dipstick SMALL (A) NEGATIVE   Bilirubin Urine NEGATIVE NEGATIVE   Ketones, ur NEGATIVE NEGATIVE mg/dL   Protein, ur NEGATIVE NEGATIVE mg/dL   Nitrite NEGATIVE NEGATIVE   Leukocytes, UA NEGATIVE NEGATIVE  Urine microscopic-add on     Status: Abnormal   Collection Time: 01/28/15 12:01 PM  Result Value Ref Range   Squamous Epithelial / LPF 0-5 (A) NONE SEEN   WBC, UA 0-5 0 - 5 WBC/hpf   RBC / HPF NONE SEEN 0 - 5 RBC/hpf   Bacteria, UA NONE SEEN NONE SEEN  POC CBG, ED     Status: None   Collection Time: 01/28/15 12:28 PM  Result Value Ref Range   Glucose-Capillary 85 65 - 99 mg/dL  I-Stat Troponin, ED - 0, 3, 6 hours (not at [facility])     Status: None   Collection Time: 01/28/15 12:35 PM  Result Value Ref Range   Troponin i, poc 0.01 0.00 - 0.08 ng/mL   Comment 3            Comment: Due to the release kinetics of cTnI, a negative result within the first hours of the onset of symptoms does not rule out myocardial infarction with certainty. If myocardial infarction is still suspected, repeat the test at appropriate intervals.     Current Facility-Administered Medications  Medication Dose Route Frequency Provider Last Rate Last Dose  . acetaminophen (TYLENOL) tablet 650 mg  650 mg Oral Q4H PRN Lacretia Leigh, MD      . alum & mag hydroxide-simeth (MAALOX/MYLANTA) 200-200-20 MG/5ML suspension 30 mL  30 mL Oral PRN Lacretia Leigh, MD      . amantadine (SYMMETREL) capsule 100 mg  100 mg Oral BID     100 mg at 01/28/15 1308  . divalproex (DEPAKOTE  ER) 24 hr tablet 1,000 mg  1,000 mg Oral QHS Lacretia Leigh, MD      . ibuprofen (ADVIL,MOTRIN) tablet 600 mg  600 mg Oral Q8H PRN Lacretia Leigh, MD      . LORazepam (ATIVAN) tablet 1 mg  1 mg Oral Q8H PRN Lacretia Leigh, MD   1 mg at 01/28/15 1107  . LORazepam (ATIVAN) tablet 1 mg  1 mg Oral BID     1 mg at 01/28/15 1308  . nicotine (NICODERM CQ - dosed in mg/24 hours) patch 21 mg  21 mg Transdermal Daily Lacretia Leigh, MD   21 mg at 01/28/15 0935  . ondansetron (ZOFRAN) tablet 4 mg  4 mg Oral Q8H PRN Lacretia Leigh, MD      . ziprasidone (GEODON) capsule 60 mg  60 mg Oral BID WC         Current Outpatient Prescriptions  Medication Sig Dispense Refill  . diphenhydramine-acetaminophen (TYLENOL PM) 25-500 MG TABS tablet Take 2 tablets by mouth at bedtime as needed (for sleep).    . divalproex (DEPAKOTE ER) 500 MG 24 hr tablet Take 1,000 mg by mouth at bedtime.  0  . OLANZapine (ZYPREXA) 10 MG tablet Take 10 mg by mouth at bedtime.  0    Musculoskeletal: Strength & Muscle Tone: within normal limits Gait & Station: normal Patient leans: N/A  Psychiatric Specialty Exam: Review of  Systems  Constitutional: Negative.   HENT: Negative.   Eyes: Negative.   Respiratory: Negative.   Cardiovascular: Negative.   Gastrointestinal: Negative.   Genitourinary: Negative.   Musculoskeletal: Negative.   Skin: Negative.   Neurological: Negative.   Endo/Heme/Allergies: Negative.     Blood pressure 132/100, pulse 92, temperature 98.5 F (36.9 C), temperature source Oral, resp. rate 18, last menstrual period 01/25/2015, SpO2 100 %.There is no weight on file to calculate BMI.  General Appearance: Casual  Eye Contact::  Good  Speech:  Clear and Coherent and Pressured  Volume:  Normal  Mood:  Anxious  Affect:  Congruent  Thought Process:  Circumstantial, Disorganized, Loose and Tangential  Orientation:  Full (Time, Place, and Person)  Thought Content:  Delusions  Suicidal  Thoughts:  No  Homicidal Thoughts:  No  Memory:  Immediate;   Fair Recent;   Fair Remote;   Fair  Judgement:  Poor  Insight:  Shallow  Psychomotor Activity:  Normal  Concentration:  Fair  Recall:  Poor  Fund of Knowledge:Fair  Language: Poor  Akathisia:  No  Handed:  Right  AIMS (if indicated):     Assets:  Desire for Improvement  ADL's:  Intact  Cognition: WNL  Sleep:      Treatment Plan Summary: Daily contact with patient to assess and evaluate symptoms and progress in treatment and Medication management  Disposition: Accepted for admission and we will be seeking placement at any facility with available bed.  We have resumed her home medications.  Delfin Gant   PMHNP-BC 01/28/2015 2:30 PM Patient seen face-to-face for psychiatric evaluation, chart reviewed and case discussed with the physician extender and developed treatment plan. Reviewed the information documented and agree with the treatment plan. Corena Pilgrim, MD

## 2015-01-28 NOTE — ED Notes (Signed)
Pt AAO x 3, no distress noted, resting at present, spontaneous respirations.  Monitoring for safety, Q 15 min checks in effect.

## 2015-01-28 NOTE — ED Notes (Signed)
Presents with paranoia, religiously preoccupied.  Pt reports she has not slept in 2 days.  AAO x 3, no distress noted, cooperative and manic.  Monitoring for safety, Q 15 min checks in effect.
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
## 2015-01-28 NOTE — ED Notes (Addendum)
Pt reports not being able to sleep x 2 days.  Pt's speech is pressured and she is talking about being a prophet for God.  Pt is requesting her Zyprexa be increased.

## 2015-01-28 NOTE — Telephone Encounter (Signed)
TC to  Home, info from roommate, friend that client had called 911 last nite and had gone to ER to be seen due to emotional distress.  TC to client's phone but no answer.

## 2015-01-29 ENCOUNTER — Inpatient Hospital Stay (HOSPITAL_COMMUNITY)
Admission: AD | Admit: 2015-01-29 | Discharge: 2015-02-01 | DRG: 885 | Disposition: A | Discharge status: Home / Self Care | Payer: Medicare Other | Source: Intra-hospital | Attending: Psychiatry | Admitting: Psychiatry

## 2015-01-29 ENCOUNTER — Encounter (HOSPITAL_COMMUNITY): Payer: Self-pay

## 2015-01-29 DIAGNOSIS — G4700 Insomnia, unspecified: Secondary | ICD-10-CM | POA: Diagnosis present

## 2015-01-29 DIAGNOSIS — Z818 Family history of other mental and behavioral disorders: Secondary | ICD-10-CM | POA: Diagnosis not present

## 2015-01-29 DIAGNOSIS — Z9114 Patient's other noncompliance with medication regimen: Secondary | ICD-10-CM

## 2015-01-29 DIAGNOSIS — E119 Type 2 diabetes mellitus without complications: Secondary | ICD-10-CM | POA: Diagnosis present

## 2015-01-29 DIAGNOSIS — F250 Schizoaffective disorder, bipolar type: Secondary | ICD-10-CM | POA: Diagnosis present

## 2015-01-29 DIAGNOSIS — F419 Anxiety disorder, unspecified: Secondary | ICD-10-CM | POA: Diagnosis present

## 2015-01-29 LAB — CBG MONITORING, ED: Glucose-Capillary: 116 mg/dL — ABNORMAL HIGH (ref 65–99)

## 2015-01-29 MED ORDER — LORAZEPAM 0.5 MG PO TABS
0.5000 mg | ORAL_TABLET | Freq: Two times a day (BID) | ORAL | Status: DC
Start: 2015-01-29 — End: 2015-01-30
  Administered 2015-01-29 – 2015-01-30 (×2): 0.5 mg via ORAL
  Filled 2015-01-29 (×2): qty 1

## 2015-01-29 MED ORDER — DIVALPROEX SODIUM ER 500 MG PO TB24
1000.0000 mg | ORAL_TABLET | Freq: Every day | ORAL | Status: DC
  Administered 2015-01-29 – 2015-01-31 (×3): 1000 mg via ORAL
  Filled 2015-01-29 (×6): qty 2

## 2015-01-29 MED ORDER — MIRTAZAPINE 15 MG PO TABS
15.0000 mg | ORAL_TABLET | Freq: Every day | ORAL | Status: DC
  Administered 2015-01-29 – 2015-01-31 (×3): 15 mg via ORAL
  Filled 2015-01-29 (×6): qty 1

## 2015-01-29 MED ORDER — LORAZEPAM 0.5 MG PO TABS: 0.5000 mg | ORAL_TABLET | Freq: Two times a day (BID) | ORAL | Status: DC

## 2015-01-29 MED ORDER — ALUM & MAG HYDROXIDE-SIMETH 200-200-20 MG/5ML PO SUSP: 30.0000 mL | ORAL | Status: DC | PRN

## 2015-01-29 MED ORDER — AMANTADINE HCL 100 MG PO CAPS
100.0000 mg | ORAL_CAPSULE | Freq: Two times a day (BID) | ORAL | Status: DC
  Administered 2015-01-29 – 2015-02-01 (×6): 100 mg via ORAL
  Filled 2015-01-29 (×12): qty 1

## 2015-01-29 MED ORDER — ZIPRASIDONE HCL 60 MG PO CAPS
60.0000 mg | ORAL_CAPSULE | Freq: Two times a day (BID) | ORAL | Status: DC
Start: 2015-01-29 — End: 2015-02-01
  Administered 2015-01-29 – 2015-02-01 (×6): 60 mg via ORAL
  Filled 2015-01-29: qty 1
  Filled 2015-01-29: qty 3
  Filled 2015-01-29 (×6): qty 1
  Filled 2015-01-29: qty 3
  Filled 2015-01-29 (×3): qty 1

## 2015-01-29 NOTE — Consult Note (Signed)
[facility] Consult   Reason for Consult:  Insomnia, disorganized speech, Referring Physician:  EDP Patient Identification: [name] [name] MRN:  [id number] Principal Diagnosis: Schizoaffective disorder, bipolar type [facility]) Diagnosis:   Patient Active Problem List   Diagnosis Date Noted  . Schizoaffective disorder ([place]) [F25.9] 07/03/2014    Priority: High  . Schizoaffective disorder, bipolar type ([place]) [F25.0] 01/28/2015  . Psychoses [F29]   . Non compliance w medication regimen [Z91.14]   . Hallucinations [R44.3]     Total Time spent with patient: 25 minutes  Subjective:   [name] [name] is a 35 y.o. female patient admitted with Insomnia, disorganized speech,.  HPI:   On Admission: AA female, 35 years old was evaluated for manic symptoms.  Patient's speech is pressured, her thought and speech is disorganized as she states she is a prophet.  Patient rep[orts that she is reading the Bible because the end of time is closer.   Patient reports that she was taking Zyprexa but does not believe that his Zyprexa is effective.  Patient has not slept in 2 days as documented.  Patient reports today that she does not get enough sleep and states that her appetite is poor.  Patient denies SI/HI/AVH.  She has been accepted for admission and we will be seeking placement at any facility with available bed.  Today: Patient reports sleeping better and states that her appetite has improved. Patient has limited insight and poor judgement and remains delusional, believing that she is "all better" and "ready to go." The patient's speech remains slightly pressured. The patient is compliant with medications and cooperative with staff at this time. The patient denies suicidal ideation, homicidal ideation and auditory or visual hallucinations.  Past Psychiatric History:  Schizoaffective disorder, Psychosis,   Risk to Self: Suicidal Ideation: No Suicidal Intent: No Is patient at risk  for suicide?: No Suicidal Plan?: No Access to Means: No What has been your use of drugs/alcohol within the last 12 months?: Pt denies  How many times?: 0 Other Self Harm Risks: Pt denies  Triggers for Past Attempts: None known Intentional Self Injurious Behavior: None Risk to Others: Homicidal Ideation: No Thoughts of Harm to Others: No Current Homicidal Intent: No Current Homicidal Plan: No Access to Homicidal Means: No Identified Victim: N/A History of harm to others?: No Assessment of Violence: None Noted Violent Behavior Description: No violent behaviors observed. Pt is calm and cooperative.  Does patient have access to weapons?: No Criminal Charges Pending?: No Does patient have a court date: No Prior Inpatient Therapy: Prior Inpatient Therapy: Yes Prior Therapy Dates: 06/2014 Prior Therapy Facilty/Provider(s): [facility] Reason for Treatment: Schizoaffective Disorder  Prior Outpatient Therapy: Prior Outpatient Therapy: No Does patient have an ACCT team?: No Does patient have Intensive In-House Services?  : No Does patient have [facility] services? : No Does patient have P4CC services?: No  Past Medical History:  Past Medical History  Diagnosis Date  . Diabetes mellitus without complication ([place])   . Schizophrenia ([place])   . Bipolar affective disorder, currently manic, mild ([place])    History reviewed. No pertinent past surgical history. Family History: No family history on file.   Family Psychiatric  History:  Unknown Social History:  History  Alcohol Use No     History  Drug Use Not on file    Social History   Social History  . Marital Status: Legally Separated    Spouse Name: N/A  . Number of Children: N/A  . Years of Education:  N/A   Social History Main Topics  . Smoking status: Never Smoker   . Smokeless tobacco: None  . Alcohol Use: No  . Drug Use: None  . Sexual Activity: Not Asked   Other Topics Concern  . None   Social History Narrative    Additional Social History:    History of alcohol / drug use?: No history of alcohol / drug abuse                     Allergies:  No Active Allergies  Labs:  Results for orders placed or performed during the hospital encounter of 01/28/15 (from the past 48 hour(s))  Urine rapid drug screen (hosp performed) (Not at [facility])     Status: None   Collection Time: 01/28/15  2:30 AM  Result Value Ref Range   Opiates NONE DETECTED NONE DETECTED   Cocaine NONE DETECTED NONE DETECTED   Benzodiazepines NONE DETECTED NONE DETECTED   Amphetamines NONE DETECTED NONE DETECTED   Tetrahydrocannabinol NONE DETECTED NONE DETECTED   Barbiturates NONE DETECTED NONE DETECTED    Comment:        DRUG SCREEN FOR MEDICAL PURPOSES ONLY.  IF CONFIRMATION IS NEEDED FOR ANY PURPOSE, NOTIFY LAB WITHIN 5 DAYS.        LOWEST DETECTABLE LIMITS FOR URINE DRUG SCREEN Drug Class       Cutoff (ng/mL) Amphetamine      1000 Barbiturate      200 Benzodiazepine   283 Tricyclics       151 Opiates          300 Cocaine          300 THC              50   Comprehensive metabolic panel     Status: Abnormal   Collection Time: 01/28/15  2:52 AM  Result Value Ref Range   Sodium 139 135 - 145 mmol/L   Potassium 3.5 3.5 - 5.1 mmol/L   Chloride 103 101 - 111 mmol/L   CO2 26 22 - 32 mmol/L   Glucose, Bld 135 (H) 65 - 99 mg/dL   BUN 14 6 - 20 mg/dL   Creatinine, Ser 0.84 0.44 - 1.00 mg/dL   Calcium 9.8 8.9 - 10.3 mg/dL   Total Protein 8.9 (H) 6.5 - 8.1 g/dL   Albumin 4.9 3.5 - 5.0 g/dL   AST 26 15 - 41 U/L   ALT 15 14 - 54 U/L   Alkaline Phosphatase 50 38 - 126 U/L   Total Bilirubin 0.5 0.3 - 1.2 mg/dL   GFR calc non Af Amer >60 >60 mL/min   GFR calc Af Amer >60 >60 mL/min    Comment: (NOTE) The eGFR has been calculated using the CKD EPI equation. This calculation has not been validated in all clinical situations. eGFR's persistently <60 mL/min signify possible Chronic Kidney Disease.    Anion gap 10 5 -  15  Ethanol (ETOH)     Status: None   Collection Time: 01/28/15  2:52 AM  Result Value Ref Range   Alcohol, Ethyl (B) <5 <5 mg/dL    Comment:        LOWEST DETECTABLE LIMIT FOR SERUM ALCOHOL IS 5 mg/dL FOR MEDICAL PURPOSES ONLY   CBC     Status: None   Collection Time: 01/28/15  2:52 AM  Result Value Ref Range   WBC 6.3 4.0 - 10.5 K/uL   RBC 4.00 3.87 - 5.11  MIL/uL   Hemoglobin 12.3 12.0 - 15.0 g/dL   HCT 36.2 36.0 - 46.0 %   MCV 90.5 78.0 - 100.0 fL   MCH 30.8 26.0 - 34.0 pg   MCHC 34.0 30.0 - 36.0 g/dL   RDW 13.0 11.5 - 15.5 %   Platelets 212 150 - 400 K/uL  Urinalysis, Routine w reflex microscopic (not at [facility])     Status: Abnormal   Collection Time: 01/28/15 12:01 PM  Result Value Ref Range   Color, Urine YELLOW YELLOW   APPearance CLEAR CLEAR   Specific Gravity, Urine 1.003 (L) 1.005 - 1.030   pH 7.0 5.0 - 8.0   Glucose, UA NEGATIVE NEGATIVE mg/dL   Hgb urine dipstick SMALL (A) NEGATIVE   Bilirubin Urine NEGATIVE NEGATIVE   Ketones, ur NEGATIVE NEGATIVE mg/dL   Protein, ur NEGATIVE NEGATIVE mg/dL   Nitrite NEGATIVE NEGATIVE   Leukocytes, UA NEGATIVE NEGATIVE  Urine microscopic-add on     Status: Abnormal   Collection Time: 01/28/15 12:01 PM  Result Value Ref Range   Squamous Epithelial / LPF 0-5 (A) NONE SEEN   WBC, UA 0-5 0 - 5 WBC/hpf   RBC / HPF NONE SEEN 0 - 5 RBC/hpf   Bacteria, UA NONE SEEN NONE SEEN  POC CBG, ED     Status: None   Collection Time: 01/28/15 12:28 PM  Result Value Ref Range   Glucose-Capillary 85 65 - 99 mg/dL  I-Stat Troponin, ED - 0, 3, 6 hours (not at [facility])     Status: None   Collection Time: 01/28/15 12:35 PM  Result Value Ref Range   Troponin i, poc 0.01 0.00 - 0.08 ng/mL   Comment 3            Comment: Due to the release kinetics of cTnI, a negative result within the first hours of the onset of symptoms does not rule out myocardial infarction with certainty. If myocardial infarction is still suspected, repeat the test at  appropriate intervals.   I-Stat Troponin, ED - 0, 3, 6 hours (not at [facility])     Status: None   Collection Time: 01/28/15  3:43 PM  Result Value Ref Range   Troponin i, poc 0.00 0.00 - 0.08 ng/mL   Comment 3            Comment: Due to the release kinetics of cTnI, a negative result within the first hours of the onset of symptoms does not rule out myocardial infarction with certainty. If myocardial infarction is still suspected, repeat the test at appropriate intervals.   I-Stat Troponin, ED - 0, 3, 6 hours (not at [facility])     Status: None   Collection Time: 01/28/15  6:08 PM  Result Value Ref Range   Troponin i, poc 0.00 0.00 - 0.08 ng/mL   Comment 3            Comment: Due to the release kinetics of cTnI, a negative result within the first hours of the onset of symptoms does not rule out myocardial infarction with certainty. If myocardial infarction is still suspected, repeat the test at appropriate intervals.   POC CBG, ED     Status: Abnormal   Collection Time: 01/29/15  8:15 AM  Result Value Ref Range   Glucose-Capillary 116 (H) 65 - 99 mg/dL    Current Facility-Administered Medications  Medication Dose Route Frequency Provider Last Rate Last Dose  . acetaminophen (TYLENOL) tablet 650 mg  650 mg Oral Q4H PRN  Lacretia Leigh, MD      . alum & mag hydroxide-simeth (MAALOX/MYLANTA) 200-200-20 MG/5ML suspension 30 mL  30 mL Oral PRN Lacretia Leigh, MD      . amantadine (SYMMETREL) capsule 100 mg  100 mg Oral BID Makana Rostad   100 mg at 01/29/15 0957  . divalproex (DEPAKOTE ER) 24 hr tablet 1,000 mg  1,000 mg Oral QHS Lacretia Leigh, MD   1,000 mg at 01/28/15 2127  . ibuprofen (ADVIL,MOTRIN) tablet 600 mg  600 mg Oral Q8H PRN Lacretia Leigh, MD      . LORazepam (ATIVAN) tablet 0.5 mg  0.5 mg Oral BID Teesha Ohm      . LORazepam (ATIVAN) tablet 1 mg  1 mg Oral Q8H PRN Lacretia Leigh, MD   1 mg at 01/28/15 1107  . nicotine (NICODERM CQ - dosed in mg/24 hours) patch 21 mg  21 mg  Transdermal Daily Lacretia Leigh, MD   21 mg at 01/28/15 0935  . ondansetron (ZOFRAN) tablet 4 mg  4 mg Oral Q8H PRN Lacretia Leigh, MD      . ziprasidone (GEODON) capsule 60 mg  60 mg Oral BID WC Kynzleigh Bandel   60 mg at 01/29/15 2878   Current Outpatient Prescriptions  Medication Sig Dispense Refill  . diphenhydramine-acetaminophen (TYLENOL PM) 25-500 MG TABS tablet Take 2 tablets by mouth at bedtime as needed (for sleep).    . divalproex (DEPAKOTE ER) 500 MG 24 hr tablet Take 1,000 mg by mouth at bedtime.  0  . OLANZapine (ZYPREXA) 10 MG tablet Take 10 mg by mouth at bedtime.  0    Musculoskeletal: Strength & Muscle Tone: within normal limits Gait & Station: normal Patient leans: N/A  Psychiatric Specialty Exam: Review of Systems  Constitutional: Negative.   HENT: Negative.   Eyes: Negative.   Respiratory: Negative.   Cardiovascular: Negative.   Gastrointestinal: Negative.   Genitourinary: Negative.   Musculoskeletal: Negative.   Skin: Negative.   Neurological: Negative.   Endo/Heme/Allergies: Negative.     Blood pressure 106/69, pulse 112, temperature 98.3 F (36.8 C), temperature source Oral, resp. rate 18, last menstrual period 01/25/2015, SpO2 97 %.There is no weight on file to calculate BMI.  General Appearance: Casual  Eye Contact::  Good  Speech:  Clear and Coherent and Pressured  Volume:  Normal  Mood:  Anxious  Affect:  Congruent  Thought Process:  Circumstantial  Orientation:  Full (Time, Place, and Person)  Thought Content:  Delusions  Suicidal Thoughts:  No  Homicidal Thoughts:  No  Memory:  Immediate;   Fair Recent;   Fair Remote;   Fair  Judgement:  Poor  Insight:  Shallow  Psychomotor Activity:  Normal  Concentration:  Fair  Recall:  Poor  Fund of Knowledge:Fair  Language: Fair  Akathisia:  No  Handed:  Right  AIMS (if indicated):     Assets:  Desire for Improvement  ADL's:  Intact  Cognition: WNL  Sleep:      Treatment Plan  Summary: Daily contact with patient to assess and evaluate symptoms and progress in treatment and Medication management Diagnosis: Schizoaffective Disorder, Bipolar Type -Crisis Stabilization -Individual Counseling -Medication: Continue to take Geodon 40m BID for psychosis; Depakote 10069mQHS for mood stabilization; Amantadine 10051mID for EPS; Ativan 0.5mg65mD for anxiety; Ativan 1mg 72mrs PRN for anxiety  Disposition: Accepted for admission and we will be seeking placement at any facility with available bed.  We have resumed her home medications.  LORD,  JAMISON   PMHNP-BC 01/29/2015 1:15 PM   Patient seen face-to-face for psychiatric evaluation, chart reviewed and case discussed with the physician extender and developed treatment plan. Reviewed the information documented and agree with the treatment plan. Corena Pilgrim, MD

## 2015-01-29 NOTE — Progress Notes (Addendum)
35 yr old female medicare/medicaid of [place] and [employer] med[employer] pt living in [place] from [place] to [place] with her pastor Secretary/administrator) Pt with dx schizoaffective disorder, bipolar type  Pt for inpatient admission Pt reported she ran out of medication Cm went to visit another pt in [facility] when pt approached CM to discuss that she preferred to go home vs going to inpatient unit.  CM asked if pt had spoken with [facility] staff and she confirmed she had but "they did not give me the right answer" Cm informed pt her d/c plan is determined by staff in [place].  CM informed pt she would share her concerns with her [facility] RN. Pt confirmed with CM that since CM saw her last 06/30/14 she has not obtained a pcp for f/u care CM spoke with other [facility] pt and then spoke with [facility] RN about pt concerns.  Pt has signed her consent form to be transferred to an inpatient unit.  Cm spoke with pt again to discuss her inpatient stay and pt medication concerns about getting medications after d/c Cm discussed again this is why her pcp and [place] providers in [employer] are important for her Pt voiced understanding and "I have no other choice"  Pt received a list of [facility] accepting providers to assist with pcp choice

## 2015-01-29 NOTE — Progress Notes (Signed)
Adult Psychoeducational Group Note  Date:  01/29/2015 Time:  8:47 PM  Group Topic/Focus:  Wrap-Up Group:   The focus of this group is to help patients review their daily goal of treatment and discuss progress on daily workbooks.  Participation Level:  Active  Participation Quality:  Appropriate  Affect:  Appropriate  Cognitive:  Appropriate  Insight: Appropriate  Engagement in Group:  Engaged  Modes of Intervention:  Discussion  Additional Comments:  Pt showed up to group late. Pt mentioned she had a good day. Pt goal for tomorrow is to talk with doctor about discharging and getting outside help such as a primary doctor.   Jerline Pain 01/29/2015, 8:47 PM

## 2015-01-29 NOTE — Progress Notes (Signed)
[name] [name] was admitted to room 503-2 from [facility] ED.  She reports that she came in to "get my medications straight, I don't want to be here."  She stated in the ED that she was paranoid, trouble sleeping and hearing voices but she was unable to hear what they were telling her.  She was irritable during the admission and didn't want to answer questions.  Skin assessment completed and no issues noted.  Belongings searched/secured and placed in lockers (3 and 6).  She had multiple clothing, jewelry, cell phone, boots, pocket book.  Reviewed admission paperwork.  Oriented her to the unit.  Encouraged participation in group and unit activities. Q 15 minute checks initiated for safety.  We will monitor the progress towards her goals.

## 2015-01-29 NOTE — Tx Team (Signed)
Initial Interdisciplinary Treatment Plan   PATIENT STRESSORS: Financial difficulties Medication change or noncompliance Occupational concerns   PATIENT STRENGTHS: Average or above average intelligence Communication skills Physical Health   PROBLEM LIST: Problem List/Patient Goals Date to be addressed Date deferred Reason deferred Estimated date of resolution  Psychosis 01/29/2015     Medication noncompliance 01/29/2015     "to get my medications straight and be discharged" 01/29/2015                                          DISCHARGE CRITERIA:  Improved stabilization in mood, thinking, and/or behavior Need for constant or close observation no longer present Verbal commitment to aftercare and medication compliance  PRELIMINARY DISCHARGE PLAN: Outpatient therapy Return to previous living arrangement  PATIENT/FAMIILY INVOLVEMENT: This treatment plan has been presented to and reviewed with the patient, [name] [name].  The patient and family have been given the opportunity to ask questions and make suggestions.  Zipporah Plants 01/29/2015, 6:43 PM

## 2015-01-29 NOTE — ED Notes (Signed)
Patient transferred to [facility].  Left the unit ambulatory with [employer].  All belongings given to the driver.  Patient states she feels better on the new medications.

## 2015-01-29 NOTE — Progress Notes (Signed)
Patient ID: [name] [name], female   DOB: [birth year], 35 y.o.   MRN: [id number] Per State regulations 482.30 this chart was reviewed for medical necessity with respect to the patient's admission/duration of stay.    Next review date: 02/02/15  Debarah Crape, BSN, RN-BC  Case Manager

## 2015-01-29 NOTE — Progress Notes (Signed)
Entered in d/c instructions  Please use the resources provided to you in emergency room by case manager to assist with doctor for follow up Schedule an appointment as soon as possible for a visit As needed This list of medicare in network doctors within [place] will assist with you making your choice

## 2015-01-29 NOTE — BH Assessment (Signed)
[place] Assessment Progress Note  Per Corena Pilgrim, MD, this pt requires psychiatric hospitalization at this time.  Letitia Libra, RN, [facility] has assigned pt to [facility] Rm 503-2.  Pt has signed Voluntary Admission and Consent for Treatment, as well as Consent to Release Information to no one, and signed forms have been faxed to [facility].  Pt's nurse, Gerrit Friends, has been notified, and agrees to send original paperwork along with pt via [facility] [place], and to call report to [phone].  Jalene Mullet, [place] Triage Specialist [phone]

## 2015-01-30 ENCOUNTER — Encounter (HOSPITAL_COMMUNITY): Payer: Self-pay | Admitting: Psychiatry

## 2015-01-30 DIAGNOSIS — F250 Schizoaffective disorder, bipolar type: Principal | ICD-10-CM

## 2015-01-30 LAB — PREGNANCY, URINE: Preg Test, Ur: NEGATIVE

## 2015-01-30 MED ORDER — MAGNESIUM CITRATE PO SOLN
1.0000 | Freq: Once | ORAL | Status: AC
  Administered 2015-01-30: 1 via ORAL

## 2015-01-30 MED ORDER — OLANZAPINE 5 MG PO TABS: 5.0000 mg | ORAL_TABLET | Freq: Four times a day (QID) | ORAL | Status: DC | PRN

## 2015-01-30 MED ORDER — LORAZEPAM 0.5 MG PO TABS: 0.5000 mg | ORAL_TABLET | Freq: Two times a day (BID) | ORAL | Status: DC | PRN

## 2015-01-30 NOTE — Progress Notes (Signed)
Adult Psychoeducational Group Note  Date:  01/30/2015 Time:  8:15 PM  Group Topic/Focus:  Wrap-Up Group:   The focus of this group is to help patients review their daily goal of treatment and discuss progress on daily workbooks.  Participation Level:  Active  Participation Quality:  Appropriate  Affect:  Appropriate  Cognitive:  Appropriate  Insight: Appropriate  Engagement in Group:  Engaged  Modes of Intervention:  Discussion  Additional Comments:  Pt was pleasant during wrap-up group. Pt rated overall day an 11 out of 10 because "I'm alive and I have faith in God and He got me through another day." Pt reported that she had a goal for the day, but she chose not to share it with the group. Pt noted that the highlight of her day was singing and read the bible with her roommate.   Lincoln Brigham 01/30/2015, 8:27 PM

## 2015-01-30 NOTE — BHH Suicide Risk Assessment (Signed)
[facility] Admission Suicide Risk Assessment   Nursing information obtained from:    Demographic factors:    Current Mental Status:    Loss Factors:    Historical Factors:    Risk Reduction Factors:     Total Time spent with patient: 30 minutes Principal Problem: Schizoaffective disorder, bipolar type ([place]) Diagnosis:   Patient Active Problem List   Diagnosis Date Noted  . Schizoaffective disorder, bipolar type ([place]) [F25.0] 01/28/2015  . Non compliance w medication regimen [Z91.14]    Subjective Data: Patient states that she came to get help since she ran out of her medications. She went to [facility] , however was asked to go back the next day. Pt reports a hx of Bipolar do as well as hx of several hospitalizations. Pt relocated to [place] with her preacher 8 months ago from [place] since he had to move down here. Pt does not have any other social support at this time. Pt reports that the current combination of Geodon and depakote is helpful, but she does feel a bit drowsy in the AM.   Continued Clinical Symptoms:  Alcohol Use Disorder Identification Test Final Score (AUDIT): 0 The "Alcohol Use Disorders Identification Test", Guidelines for Use in Primary Care, Second Edition.  World Pharmacologist [facility]). Score between 0-7:  no or low risk or alcohol related problems. Score between 8-15:  moderate risk of alcohol related problems. Score between 16-19:  high risk of alcohol related problems. Score 20 or above:  warrants further diagnostic evaluation for alcohol dependence and treatment.   CLINICAL FACTORS:   Bipolar Disorder:   Mixed State Unstable or Poor Therapeutic Relationship Previous Psychiatric Diagnoses and Treatments   Musculoskeletal: Strength & Muscle Tone: within normal limits Gait & Station: normal Patient leans: N/A  Psychiatric Specialty Exam: Review of Systems  Psychiatric/Behavioral: Positive for depression. The patient is nervous/anxious and has insomnia.    All other systems reviewed and are negative.   Blood pressure 121/71, pulse 102, temperature 99.7 F (37.6 C), temperature source Oral, resp. rate 16, height 5\' 4"  (1.626 m), weight 89.812 kg (198 lb), last menstrual period 01/25/2015, SpO2 100 %.Body mass index is 33.97 kg/(m^2).  General Appearance: Casual  Eye Contact::  Fair  Speech:  Clear and Coherent  Volume:  Normal  Mood:  Anxious  Affect:  Congruent  Thought Process:  Goal Directed  Orientation:  Full (Time, Place, and Person)  Thought Content:  Rumination  Suicidal Thoughts:  No  Homicidal Thoughts:  No  Memory:  Immediate;   Fair Recent;   Fair Remote;   Fair  Judgement:  Impaired  Insight:  Fair  Psychomotor Activity:  Normal  Concentration:  Fair  Recall:  [employer] of Knowledge:Fair  Language: Fair  Akathisia:  No  Handed:  Right  AIMS (if indicated):     Assets:  Desire for Improvement  Sleep:  Number of Hours: 5.25  Cognition: WNL  ADL's:  Intact    COGNITIVE FEATURES THAT CONTRIBUTE TO RISK:  Closed-mindedness, Polarized thinking and Thought constriction (tunnel vision)    SUICIDE RISK:   Mild:  Suicidal ideation of limited frequency, intensity, duration, and specificity.  There are no identifiable plans, no associated intent, mild dysphoria and related symptoms, good self-control (both objective and subjective assessment), few other risk factors, and identifiable protective factors, including available and accessible social support.  PLAN OF CARE: Patient will benefit from inpatient treatment and stabilization.  Estimated length of stay is 5-7 days.  Reviewed past medical records,treatment plan.  Will continue Depakote ER 1000 mg po qhs for mood sx. Depakote level tomorrow AM. Will continue Geodon 60 mg po bid with meals . Pt reports she feels drowsy - discussed that we could reassess tomorrow and readjust medications if needed. Will continue Remeron 15 mg po qhs for sleep. Will continue Amantadine  100 mg po bid for side effects of Geodon. Will make PRN medications as per agitation protocol. Will continue to monitor vitals ,medication compliance and treatment side effects while patient is here.  Will monitor for medical issues as well as call consult as needed.  Reviewed labs ,cbc - wnl, cmp - wnl, uds- negative, BAL <5, will get urine pregnancy test, lipid panel, hba1c, PL. EKG for qtc wnl.  CSW will start working on disposition.  Patient to participate in therapeutic milieu .       I certify that inpatient services furnished can reasonably be expected to improve the patient's condition.   Christne Platts, MD 01/30/2015, 1:24 PM
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
## 2015-01-30 NOTE — Progress Notes (Signed)
D- Patient is depressed. Brightens on approach. Denies SI, HI, AVH, and pain.  Patient has c/o constipation. NP notified.  On patient self inventory, patient denies depression, feelings of hopelessness, and anxiety. Patient has complaints of low energy and feels that the medication is making her "sleepy" throughout the day.  No complaints.  A- Scheduled medications administered to patient, per MD orders. Support and encouragement provided.  Routine safety checks conducted every 15 minutes.  Patient informed to notify staff with problems or concerns. R- No adverse drug reactions noted. Patient contracts for safety at this time. Patient compliant with medications and treatment plan. Patient receptive, calm, and cooperative. Patient interacts well with others on the unit.  Patient remains safe at this time.

## 2015-01-30 NOTE — BHH Group Notes (Signed)
[facility] LCSW Group Therapy  01/30/2015 2:47 PM   Type of Therapy:  Group Therapy  Participation Level:  Active  Participation Quality:  Attentive  Affect:  Appropriate  Cognitive:  Appropriate  Insight:  Improving  Engagement in Therapy:  Engaged  Modes of Intervention:  Clarification, Education, Exploration and Socialization  Summary of Progress/Problems: Today's group focused on relapse prevention.  We defined the term, and then brainstormed on ways to prevent relapse. Came to group and was engaged.  Called out and did not return.  Roque Lias B 01/30/2015 , 2:47 PM
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
## 2015-01-30 NOTE — Progress Notes (Signed)
D:Patient in her room on approach reading her bible.  Patient states she had a good day but could not elaborate why.  Patient states she did not have a goal today. Patient did not engage in conversation. Patient denies SI/HI and denies AVH  A: Staff to monitor Q 15 mins for safety.  Encouragement and support offered.  Scheduled medications administered per orders. R: Patient remains safe on the unit.  Patient attended group tonight.  Patient visible on the unit for mediations tonight.  Patient taking administered medicaitons

## 2015-01-30 NOTE — Progress Notes (Signed)
Patient ID: [name] [name], female   DOB: May 27, 1980, 35 y.o.   MRN: [id number] D: Patient reports she is not happy and did not ask to be here. Pt reports she was unable to make it to [facility] on time and ran out of medication. Pt reports the medication given at the psych ED has been effective and want to discharge with medication samples.  Pt mood and affect appeared depressed and flat.  Pt denies SI/HI/AVH and pain. Pt requested and signed 72 hour discharge form.  No acute physical distressed noted.  A: Met with pt 1:1. Medications administered as prescribed. Support and encouragement provided. Pt encouraged to discuss feelings and come to staff with any question or concerns. 72 hr d/c forms explained to pt. R: Patient remains safe and complaint with medications.

## 2015-01-30 NOTE — BHH Group Notes (Signed)
[place] Group Notes:  (Nursing/MHT/Case Management/Adjunct)  Date:  01/30/2015  Time:  6:56 PM  Type of Therapy:  Nurse Education  Participation Level:  Active  Participation Quality:  Appropriate  Affect:  Appropriate  Cognitive:  Alert and Appropriate  Insight:  Improving  Engagement in Group:  Supportive  Modes of Intervention:  Discussion and Education  Summary of Progress/Problems:  Group topic was Recovery. Discussed coping skills, goal setting and importance of sleep. She was very attentive but listened and was supportive.  Windell Moment 01/30/2015, 6:56 PM

## 2015-01-31 NOTE — Progress Notes (Signed)
D:Patient states she had a good day today.  Patient smiled and appeared to be in an upbeat and good mood.  Patient states she had a great day because her female friend came to visit her today.  Patient states she is excited about being discharged tomorrow.  Patient denies SI/HI and denies AVH. A: Staff to monitor Q 15 mins for safety.  Encouragement and support offered.  Scheduled medications administered per orders. R: Patient remains safe on the unit.  Patient attended group tonight.  Patient visible on the unit and interacting with peers.  Patient taking administered medications

## 2015-01-31 NOTE — Progress Notes (Signed)
Patient was told twice this AM that she had labs.  Patient was informed the lab was here and ready for her and patient stated ok.  When staff came down for a third time patient was in the shower and continued to shower after being told she was the last patient and the lab was waiting for her.  Lab waited but then left after patient never showing up.  Labs rescheduled for tomorrow morning.

## 2015-01-31 NOTE — Progress Notes (Signed)
[facility] MD Progress Note  01/31/2015 3:25 PM [name] [name]  MRN:  [id number] Subjective:  Pt states " I am a bit drowsy this AM , but I am ok, I think I can manage ."  Objective:[name] [name] is a 35 y.o.AA female, who has a hx of schizoaffective do, who recently relocated to [facility] , hence was noncompliant on medications, presented to [facility] due to paranoia. Patient seen and chart reviewed.Discussed patient with treatment team.  Pt today seen as less depressed, reports she is less paranoid , tolerating her medications well. Pt per staff  Has had no disruptive issues noted on the unit.      Principal Problem: Schizoaffective disorder, bipolar type ([place]) Diagnosis:   Patient Active Problem List   Diagnosis Date Noted  . Schizoaffective disorder, bipolar type ([place]) [F25.0] 01/28/2015  . Non compliance w medication regimen [Z91.14]    Total Time spent with patient: 30 minutes  Past Psychiatric History:Hx of Bipolar do , has had several hospitalizations in the past , has two hospitaliZations after relocating to [place].Pt wants to reestablish care here.  Past Medical History:  Past Medical History  Diagnosis Date  . Diabetes mellitus without complication ([place])   . Schizophrenia ([place])   . Bipolar affective disorder, currently manic, mild ([place])    History reviewed. No pertinent past surgical history. Family History:  Family History  Problem Relation Age of Onset  . Drug abuse Maternal Uncle    Family Psychiatric  History: Hx of drug abuse in uncle. Social History:  History  Alcohol Use No     History  Drug Use Not on file    Social History   Social History  . Marital Status: Legally Separated    Spouse Name: N/A  . Number of Children: N/A  . Years of Education: N/A   Social History Main Topics  . Smoking status: Never Smoker   . Smokeless tobacco: None  . Alcohol Use: No  . Drug Use: None  . Sexual Activity: Not Asked   Other Topics Concern  . None   Social  History Narrative   Additional Social History:                         Sleep: Fair  Appetite:  Fair  Current Medications: Current Facility-Administered Medications  Medication Dose Route Frequency Provider Last Rate Last Dose  . alum & mag hydroxide-simeth (MAALOX/MYLANTA) 200-200-20 MG/5ML suspension 30 mL  30 mL Oral Q4H PRN Patrecia Pour, NP      . amantadine (SYMMETREL) capsule 100 mg  100 mg Oral BID Patrecia Pour, NP   100 mg at 01/31/15 0815  . divalproex (DEPAKOTE ER) 24 hr tablet 1,000 mg  1,000 mg Oral QHS Patrecia Pour, NP   1,000 mg at 01/30/15 2128  . LORazepam (ATIVAN) tablet 0.5 mg  0.5 mg Oral BID PRN Ursula Alert, MD      . mirtazapine (REMERON) tablet 15 mg  15 mg Oral QHS Laverle Hobby, PA-C   15 mg at 01/30/15 2128  . OLANZapine (ZYPREXA) tablet 5 mg  5 mg Oral Q6H PRN Kamir Selover, MD      . ziprasidone (GEODON) capsule 60 mg  60 mg Oral BID WC Patrecia Pour, NP   60 mg at 01/31/15 [id number]    Lab Results:  Results for orders placed or performed during the hospital encounter of 01/29/15 (from the past 48 hour(s))  Pregnancy, urine  Status: None   Collection Time: 01/30/15  3:12 PM  Result Value Ref Range   Preg Test, Ur NEGATIVE NEGATIVE    Comment:        THE SENSITIVITY OF THIS METHODOLOGY IS >20 mIU/mL. Performed at [facility]     Physical Findings: AIMS: Facial and Oral Movements Muscles of Facial Expression: None, normal Lips and Perioral Area: None, normal Jaw: None, normal Tongue: None, normal,Extremity Movements Upper (arms, wrists, hands, fingers): None, normal Lower (legs, knees, ankles, toes): None, normal, Trunk Movements Neck, shoulders, hips: None, normal Incapacitation due to abnormal movements: None, normal Patient's awareness of abnormal movements (rate only patient's report): No Awareness, Dental  Status Current problems with teeth and/or dentures?: No Does patient usually wear dentures?: No  CIWA:    COWS:     Musculoskeletal: Strength & Muscle Tone: within normal limits Gait & Station: normal Patient leans: N/A  Psychiatric Specialty Exam: Review of Systems  Psychiatric/Behavioral: The patient is nervous/anxious.   All other systems reviewed and are negative.   Blood pressure 132/76, pulse 96, temperature 99.2 F (37.3 C), temperature source Oral, resp. rate 16, height 5\' 4" (1.626 m), weight 89.812 kg (198 lb), last menstrual period 01/25/2015, SpO2 100 %.Body mass index is 33.97 kg/(m^2).  General Appearance: Casual  Eye Contact::  Fair  Speech:  Clear and Coherent  Volume:  Normal  Mood: Anxious  Affect:  Appropriate  Thought Process:  Coherent  Orientation:  Full (Time, Place, and Person)  Thought Content:  Paranoid Ideation and Rumination  Suicidal Thoughts:  No  Homicidal Thoughts:  No  Memory:  Immediate;   Fair Recent;   Fair Remote;   Fair  Judgement:  Impaired  Insight:  Fair  Psychomotor Activity:  Normal  Concentration:  Fair  Recall:  [employer] of Knowledge:Fair  Language: Fair  Akathisia:  No  Handed:  Right  AIMS (if indicated):     Assets:  Physical Health Social Support  ADL's:  Intact  Cognition: WNL  Sleep:  Number of Hours: 6   Treatment Plan Summary:[name] [name] is a 35 y.o.AA female, who has a hx of schizoaffective do, currently started on medications. Tolerating it well. Will continue the same.  Daily contact with patient to assess and evaluate symptoms and progress in treatment and Medication management Will continue Depakote ER 1000 mg po qhs for mood sx. Depakote level was ordered for today - but pt refused. She could get it from her out pt provider. Will continue Geodon 60 mg po bid with meals .  Will continue Remeron 15 mg po qhs for sleep. Will continue Amantadine 100 mg po bid for side effects of Geodon. Will make  PRN medications as per agitation protocol. Will continue to monitor vitals ,medication compliance and treatment side effects while patient is here.  Will monitor for medical issues as well as call conAA female, who has a hx of schizoaffective do, who recently relocated to [facility] , hence was noncompliant on medications, presented to [facility] due to paranoia. Patient seen and chart reviewed.Discussed patient with treatment team.  Pt today seen as less depressed, reports she is less paranoid , tolerating her medications well. Pt per staff  Has had no disruptive issues noted on the unit.      Principal Problem: Schizoaffective disorder, bipolar type ([place]) Diagnosis:   Patient Active Problem List   Diagnosis Date Noted  . Schizoaffective disorder, bipolar type ([place]) [F25.0] 01/28/2015  . Non compliance w medication regimen [Z91.14]    Total Time spent with patient: 30 minutes  Past Psychiatric History:Hx of Bipolar do , has had several hospitalizations in the past , has two hospitaliZations after relocating to [place].Pt wants to reestablish care here.  Past Medical History:  Past Medical History  Diagnosis Date  . Diabetes mellitus without complication ([place])   . Schizophrenia ([place])   . Bipolar affective disorder, currently manic, mild ([place])    History reviewed. No pertinent past surgical history. Family History:  Family History  Problem Relation Age of Onset  . Drug abuse Maternal Uncle    Family Psychiatric  History: Hx of drug abuse in uncle. Social History:  History  Alcohol Use No     History  Drug Use Not on file    Social History   Social History  . Marital Status: Legally Separated    Spouse Name: N/A  . Number of Children: N/A  . Years of Education: N/A   Social History Main Topics  . Smoking status: Never Smoker   . Smokeless tobacco: None  . Alcohol Use: No  . Drug Use: None  . Sexual Activity: Not Asked   Other Topics Concern  . None   Social  History Narrative   Additional Social History:                         Sleep: Fair  Appetite:  Fair  Current Medications: Current Facility-Administered Medications  Medication Dose Route Frequency Provider Last Rate Last Dose  . alum & mag hydroxide-simeth (MAALOX/MYLANTA) 200-200-20 MG/5ML suspension 30 mL  30 mL Oral Q4H PRN Patrecia Pour, NP      . amantadine (SYMMETREL) capsule 100 mg  100 mg Oral BID Patrecia Pour, NP   100 mg at 01/31/15 0815  . divalproex (DEPAKOTE ER) 24 hr tablet 1,000 mg  1,000 mg Oral QHS Patrecia Pour, NP   1,000 mg at 01/30/15 2128  . LORazepam (ATIVAN) tablet 0.5 mg  0.5 mg Oral BID PRN Ursula Alert, MD      . mirtazapine (REMERON) tablet 15 mg  15 mg Oral QHS Laverle Hobby, PA-C   15 mg at 01/30/15 2128  . OLANZapine (ZYPREXA) tablet 5 mg  5 mg Oral Q6H PRN [place]ir Selover, MD      . ziprasidone (GEODON) capsule 60 mg  60 mg Oral BID WC Patrecia Pour, NP   60 mg at 01/31/15 [id number]    Lab Results:  Results for orders placed or performed during the hospital encounter of 01/29/15 (from the past 48 hour(s))  Pregnancy, urine  Status: None   Collection Time: 01/30/15  3:12 PM  Result Value Ref Range   Preg Test, Ur NEGATIVE NEGATIVE    Comment:        THE SENSITIVITY OF THIS METHODOLOGY IS >20 mIU/mL. Performed at [facility]     Physical Findings: AIMS: Facial and Oral Movements Muscles of Facial Expression: None, normal Lips and Perioral Area: None, normal Jaw: None, normal Tongue: None, normal,Extremity Movements Upper (arms, wrists, hands, fingers): None, normal Lower (legs, knees, ankles, toes): None, normal, Trunk Movements Neck, shoulders, hips: None, normal, Overall Severity Severity of abnormal movements (highest score from questions above): None, normal Incapacitation due to abnormal movements: None, normal Patient's awareness of abnormal movements (rate only patient's report): No Awareness, Dental  Status Current problems with teeth and/or dentures?: No Does patient usually wear dentures?: No  CIWA:    COWS:     Musculoskeletal: Strength & Muscle Tone: within normal limits Gait & Station: normal Patient leans: N/A  Psychiatric Specialty Exam: Review of Systems  Psychiatric/Behavioral: The patient is nervous/anxious.   All other systems reviewed and are negative.   Blood pressure 132/76, pulse 96, temperature 99.2 F (37.3 C), temperature source Oral, resp. rate 16, height 5\' 4"  (1.626 m), weight 89.812 kg (198 lb), last menstrual period 01/25/2015, SpO2 100 %.Body mass index is 33.97 kg/(m^2).  General Appearance: Casual  Eye Contact::  Fair  Speech:  Clear and Coherent  Volume:  Normal  Mood:  Anxious  Affect:  Appropriate  Thought Process:  Coherent  Orientation:  Full (Time, Place, and Person)  Thought Content:  Paranoid Ideation and Rumination  Suicidal Thoughts:  No  Homicidal Thoughts:  No  Memory:  Immediate;   Fair Recent;   Fair Remote;   Fair  Judgement:  Impaired  Insight:  Fair  Psychomotor Activity:  Normal  Concentration:  Fair  Recall:  [employer] of Knowledge:Fair  Language: Fair  Akathisia:  No  Handed:  Right  AIMS (if indicated):     Assets:  Physical Health Social Support  ADL's:  Intact  Cognition: WNL  Sleep:  Number of Hours: 6   Treatment Plan Summary:[name] [name] is a 35 y.o.AA female, who has a hx of schizoaffective do, currently started on medications. Tolerating it well. Will continue the same.  Daily contact with patient to assess and evaluate symptoms and progress in treatment and Medication management Will continue Depakote ER 1000 mg po qhs for mood sx. Depakote level was ordered for today - but pt refused. She could get it from her out pt provider. Will continue Geodon 60 mg po bid with meals .  Will continue Remeron 15 mg po qhs for sleep. Will continue Amantadine 100 mg po bid for side effects of Geodon. Will make  PRN medications as per agitation protocol. Will continue to monitor vitals ,medication compliance and treatment side effects while patient is here.  Will monitor for medical issues as well as call consult as needed.  Reviewed labs ,cbc - wnl, cmp - wnl, uds- negative, BAL <5,  urine pregnancy test- negative , lipid panel, hba1c, PL- refused labs ordered today. CSW will start working on disposition.  Patient to participate in therapeutic milieu .   [name] Kutzer MD 01/31/2015, 3:25 PM

## 2015-01-31 NOTE — Progress Notes (Signed)
Adult Psychoeducational Group Note  Date:  01/31/2015 Time:  8:15 PM  Group Topic/Focus:  Wrap-Up Group:   The focus of this group is to help patients review their daily goal of treatment and discuss progress on daily workbooks.  Participation Level:  Active  Participation Quality:  Appropriate  Affect:  Appropriate  Cognitive:  Appropriate  Insight: Appropriate  Engagement in Group:  Engaged  Modes of Intervention:  Discussion  Additional Comments:  Pt was pleasant during wrap-up group. Pt stated that her day was excellent and rated her overall day a 9 out of 10 because she found that that she is getting discharged tomorrow. Pt reported that she did not have a goal for the day and noted that the highlight of her day was seeing a good friend of hers that came to visit.   Lincoln Brigham 01/31/2015, 8:41 PM

## 2015-01-31 NOTE — BHH Group Notes (Signed)
[facility] LCSW Aftercare Discharge Planning Group Note   01/31/2015 1:45 PM  Participation Quality:  Active  Mood/Affect:  Appropriate  Depression Rating:  Denies   Anxiety Rating:  Denies   Thoughts of Suicide:  No Will you contract for safety?   NA  Current AVH:  No  Plan for Discharge/Comments: Pt denies all symptoms and states that she is feeling much better. Pt reports that she is looking forward to d/c tomorrow and wants to start looking for a job once she leaves the hospital. Pt likes the meds she is on currently but dis express that she has been feeling drowsy. Will follow-up outpt with [facility].  Transportation Means:   Supports:  Georga Kaufmann

## 2015-01-31 NOTE — Progress Notes (Signed)
DAR Note: [name] [name] has been pleasant and cooperative.  She denies SI/HI or A/V hallucinations.  She reports that her main complaint today is that she is overly tired.  "All I want to do is sleep."  She attended AM group but has been in bed much of the day.  She did get up to take medications and go to the cafeteria to eat.  She denies any pain or discomfort and appears to be in no physical distress.  She did complete her self inventory and reports that her depression, hopelessness and anxiety are 0/10.   She states that her goal for today is "discharge" and she hopes to accomplish this by "taking prescribed medications."  Encouraged continued participation in group and unit activities.  Q 15 minute checks maintained for safety.  We will continue to monitor the progress towards her goals.

## 2015-01-31 NOTE — Plan of Care (Signed)
Problem: Alteration in thought process Goal: LTG-Patient has not harmed self or others in at least 2 days Outcome: Completed/Met Date Met:  01/31/15 [name] [name] has voiced no suicidal thoughts for the past two days.  She states that she is feeling better and is hoping for discharge soon.  We will continue to monitor the progress towards her goals.

## 2015-01-31 NOTE — Tx Team (Signed)
Interdisciplinary Treatment Plan Update (Adult)  Date:  01/31/2015   Time Reviewed:  2:12 PM   Progress in Treatment: Attending groups: Yes. Participating in groups:  Yes. Taking medication as prescribed:  Yes. Tolerating medication:  Yes. Family/Significant other contact made:  No Patient understands diagnosis:  Yes  As evidenced by seeking help with "getting on my meds again" Discussing patient identified problems/goals with staff:  Yes, see initial care plan. Medical problems stabilized or resolved:  Yes. Denies suicidal/homicidal ideation: Yes. Issues/concerns per patient self-inventory:  No. Other:  New problem(s) identified:  Discharge Plan or Barriers: return home, follow up outpt  Reason for Continuation of Hospitalization: Mania Medication stabilization  Comments:  TC from client after referral from [facility], a member at [employer]. [name] [name] called to ask for help. She came to [place] from [place]. She needed medication. She reported history of Bipolar disorder. She has been to [place], in [place], but feels like the medication she is on is "making me worse" She was taking Zyprexa 10 mg hs, depakote 1040m hs and melatonin 3 mg. Hs. She has no refills on her medications. She reports that she has been out of meds for several days. She reports she has not been able to sleep and has had hallucinations and been praying all night. She seemed to have pressured speech, and jumped from one subject to another. She has no transportation or money for transportation or meds. She can get meds for $3.00. [facility] Since she has not refills, [place] I will make a home visit on Sunday to provide monies from church fund to get transportation and meds on Monday. She has agreed to call me or go to the emergency room if she feels unable to wait til Monday.-[facility]  Nurse  01/31/15:  Pt ended up hospitalized the day after above note due to deteriorating symptoms. Medication trial of Geodon 60 BID, Remeron 15 QHS and Depakote 1000 QHS Pt signed 72 hr request day of admission-01/29/15.  Estimated length of stay: Likely d/c tomorrow  New goal(s):  Review of initial/current patient goals per problem list:   Review of initial/current patient goals per problem list:  1. Goal(s): Patient will participate in aftercare plan   Met: Yes   Target date: 3-5 days post admission date   As evidenced by: Patient will participate within aftercare plan AEB aftercare provider and housing plan at discharge being identified. 01/31/15:  Return home, follow up outpt       5. Goal(s): Patient will demonstrate decreased signs of psychosis  * Met: Yes  * Target date: 3-5 days post admission date  * As evidenced by: Patient will demonstrate decreased frequency of AVH or return to baseline function 01/31/15:  No signs nor symptoms of psychosis today    6. Goal (s): Patient will demonstrate decreased signs of mania  * Met: Yes  * Target date: 3-5 days post admission date  * As evidenced by: Patient demonstrate decreased signs of mania AEB decreased mood instability and return to baseline functioning 01/31/15:  No signs nor symptoms of mood instability today.  Sleep is good     Attendees: Patient:  01/31/2015 2:12 PM   Family:   01/31/2015 2:12 PM   Physician:  SUrsula Alert MD 01/31/2015 2:12 PM   Nursing:   HManuella Ghazi RN 01/31/2015 2:12 PM   CSW:    RBarbaraann Rondo  [place], [place]   01/31/2015 2:12 PM   Other:  01/31/2015 2:12 PM   Other:   01/31/2015 2:12 PM   Other:  Lars Pinks, Nurse CM 01/31/2015 2:12 PM   Other:   01/31/2015 2:12 PM   Other:  Norberto Sorenson, [place]  01/31/2015 2:12 PM   Other:  01/31/2015 2:12 PM   Other:  01/31/2015 2:12 PM   Other:  01/31/2015 2:12 PM   Other:  01/31/2015 2:12 PM   Other:  01/31/2015 2:12 PM   Other:   01/31/2015 2:12 PM     Scribe for Treatment Team:   [name], 01/31/2015 2:12 PM

## 2015-01-31 NOTE — BHH Group Notes (Signed)
[facility] LCSW Group Therapy  01/31/2015 1:47 PM  Type of Therapy: Group Therapy  Participation Level:Invited. Chose not to attend.  Summary of Progress/Problems: Shanon Brow from the [facility] was here to tell his story of recovery and play his guitar.  Kara Mead. Marshell Levan 01/31/2015 1:47 PM

## 2015-02-01 LAB — T4, FREE: FREE T4: 0.95 ng/dL (ref 0.61–1.12)

## 2015-02-01 LAB — VALPROIC ACID LEVEL: Valproic Acid Lvl: 91 ug/mL (ref 50.0–100.0)

## 2015-02-01 LAB — LIPID PANEL
Cholesterol: 150 mg/dL (ref 0–200)
HDL: 57 mg/dL (ref >40)
LDL Cholesterol: 78 mg/dL (ref 0–99)
TRIGLYCERIDES: 75 mg/dL (ref <150)
Total CHOL/HDL Ratio: 2.6 RATIO
VLDL: 15 mg/dL (ref 0–40)

## 2015-02-01 LAB — TSH: TSH: 0.976 u[IU]/mL (ref 0.350–4.500)

## 2015-02-01 MED ORDER — DIVALPROEX SODIUM ER 500 MG PO TB24: 1000.0000 mg | ORAL_TABLET | Freq: Every day | ORAL | Status: DC

## 2015-02-01 MED ORDER — ZIPRASIDONE HCL 60 MG PO CAPS: 60.0000 mg | ORAL_CAPSULE | Freq: Two times a day (BID) | ORAL | Status: DC

## 2015-02-01 MED ORDER — AMANTADINE HCL 100 MG PO CAPS: 100.0000 mg | ORAL_CAPSULE | Freq: Two times a day (BID) | ORAL | Status: DC

## 2015-02-01 MED ORDER — MIRTAZAPINE 15 MG PO TABS: 15.0000 mg | ORAL_TABLET | Freq: Every day | ORAL | Status: DC

## 2015-02-01 NOTE — Progress Notes (Signed)
Discharge note : Pt discharged per MD order. Discharge summary reviewed with pt. Pt verbalizes and signs understanding of discharge summary. RX given. Pt denies SI/HI. Denies AVH. All personal belongings returned to pt from locker # 6 and 3. Pt verbalizes and signs that she received all personal items. Ambulatory out of facility.

## 2015-02-01 NOTE — BHH Suicide Risk Assessment (Signed)
[facility] Discharge Suicide Risk Assessment   Principal Problem: Schizoaffective disorder, bipolar type [facility]) Discharge Diagnoses:  Patient Active Problem List   Diagnosis Date Noted  . Schizoaffective disorder, bipolar type ([place]) [F25.0] 01/28/2015  . Non compliance w medication regimen [Z91.14]     Total Time spent with patient: 30 minutes  Musculoskeletal: Strength & Muscle Tone: within normal limits Gait & Station: normal Patient leans: N/A  Psychiatric Specialty Exam: Review of Systems  All other systems reviewed and are negative.   Blood pressure 105/77, pulse 108, temperature 98.3 F (36.8 C), temperature source Oral, resp. rate 20, height 5\' 4"  (1.626 m), weight 89.812 kg (198 lb), last menstrual period 01/25/2015, SpO2 100 %.Body mass index is 33.97 kg/(m^2).  General Appearance: Casual  Eye Contact::  Fair  Speech:  Clear and [id number]  Volume:  Normal  Mood:  Euthymic  Affect:  Appropriate  Thought Process:  Coherent  Orientation:  Full (Time, Place, and Person)  Thought Content:  WDL  Suicidal Thoughts:  No  Homicidal Thoughts:  No  Memory:  Immediate;   Fair Recent;   Fair Remote;   Fair  Judgement:  Fair  Insight:  Fair  Psychomotor Activity:  Normal  Concentration:  Fair  Recall:  [employer] of Knowledge:Fair  Language: Fair  Akathisia:  No  Handed:  Right  AIMS (if indicated):     Assets:  Communication Skills Desire for Improvement Social Support  Sleep:  Number of Hours: 4.5  Cognition: WNL  ADL's:  Intact   Mental Status Per Nursing Assessment::   On Admission:     Demographic Factors:  Unemployed  Loss Factors: NA  Historical Factors: Impulsivity  Risk Reduction Factors:   Living with another person, especially a relative  Continued Clinical Symptoms:  Previous Psychiatric Diagnoses and Treatments  Cognitive Features That Contribute To Risk:  Polarized thinking    Suicide Risk:  Minimal: No identifiable suicidal ideation.   Patients presenting with no risk factors but with morbid ruminations; may be classified as minimal risk based on the severity of the depressive symptoms    Plan Of Care/Follow-up recommendations:  Activity:  no restrictions Diet:  regular Tests:  Depakote level on anout patient basis Other:  none  Taliana Mersereau, MD 02/01/2015, 9:52 AM

## 2015-02-01 NOTE — Progress Notes (Signed)
D:Per patient self inventory form pt reports she slept good last night. Pt reports a good appetite, normal energy level, good concentration. Pt rates depression 0/10, hopelessness 0/10, anxiety 0/10- all on 0-10 scale, 10 being the worse. Pt denies SI/HI. Denies AVH. Pt reports her goal is "discharge" and that she will "participate in group" to meet her goal. Behavior calm and cooperative. Pleasant on approach.  A:Special checks q 15 mins in place for safety. Medication administered per MD order(see eMAR) Encouragement and support provided. Discharge planning in place.  R:safety mantained. Compliant with medication regimen. Will continue to monitor.

## 2015-02-01 NOTE — Progress Notes (Signed)
  [name] :  Will you be returning to the same living situation after discharge:  No. At discharge, do you have transportation home?: Yes,  friend Do you have the ability to pay for your medications: Yes,  MCD  Release of information consent forms completed and in the chart;  Patient's signature needed at discharge.  Patient to Follow up at: Follow-up Information    Follow up with [facility] .   Why:  Call them when you get home to find out about an appointment time and date.  I faxed them the forms they need today.   Contact information:   [address]  [address]  [place] [phone]      Next level of care provider has access to [place] and Suicide Prevention discussed: Yes,  yes  Have you used any form of tobacco in the last 30 days? (Cigarettes, Smokeless Tobacco, Cigars, and/or Pipes): No  Has patient been referred to the Quitline?: N/A patient is not a smoker  Patient has been referred for addiction treatment: N/A  [name] [name] 02/01/2015, 10:24 AM

## 2015-02-01 NOTE — Discharge Summary (Signed)
Physician Discharge Summary Note  Patient:  [name] [name] is an 35 y.o., female MRN:  [id number] DOB:  01/14/1980 Patient phone:  [phone] (home)  Patient address:   [place] [address],  Total Time spent with patient: 30 minutes  Date of Admission:  01/29/2015 Date of Discharge: 02/01/2015  Reason for Admission:  psychosis  Principal Problem: Schizoaffective disorder, bipolar type [facility]) Discharge Diagnoses: Patient Active Problem List   Diagnosis Date Noted  . Schizoaffective disorder, bipolar type ([place]) [F25.0] 01/28/2015  . Non compliance w medication regimen [Z91.14]     Past Psychiatric History:  See above noted  Past Medical History:  Past Medical History  Diagnosis Date  . Diabetes mellitus without complication ([place])   . Schizophrenia ([place])   . Bipolar affective disorder, currently manic, mild ([place])    History reviewed. No pertinent past surgical history. Family History:  Family History  Problem Relation Age of Onset  . Drug abuse Maternal Uncle    Family Psychiatric  History:  Non contibutory Social History:  History  Alcohol Use No     History  Drug Use Not on file    Social History   Social History  . Marital Status: Legally Separated    Spouse Name: N/A  . Number of Children: N/A  . Years of Education: N/A   Social History Main Topics  . Smoking status: Never Smoker   . Smokeless tobacco: None  . Alcohol Use: No  . Drug Use: None  . Sexual Activity: Not Asked   Other Topics Concern  . None   Social History Narrative    Hospital Course:  [name] [name] is an 35 y.o. female initially presented to [facility] due to paranoia. Pt stated "I a m paranoid, can't sleep and I am hearing things". "I am afraid of my sin catching up with me". "I am here to get right mentally". "I am really paranoid that God is coming back". [name] [name] was admitted for Schizoaffective disorder, bipolar type ([place]) and crisis  management.  She was treated with the following medications, Depakote ER 1000 mg for mood sx.  She tolerated Geodon 60 mg, Remeron 15 mg for sleep, Amantadine 100 mg po bid for side effects of Geodon.   [name] [name] was discharged with current medication and was instructed on how to take medications as prescribed; (details listed below under Medication List).  Medical problems were identified and treated as needed.  Home medications were restarted as appropriate.  Improvement was monitored by observation and [name] [name] daily report of symptom reduction.  Emotional and mental status was monitored by daily self-inventory reports completed by [name] [name] and clinical staff.  Improvement noted and patient observed to be less paranoid in behavior.  Per nursing, patient interacted well with other patients and no disruptive behavior noted.        [name] [name] was evaluated by the treatment team for stability and plans for continued recovery upon discharge.  [name] [name] motivation was an integral factor for scheduling further treatment.  Employment, transportation, bed availability, health status, family support, and any pending legal issues were also considered during her hospital stay.  She was offered further treatment options upon discharge including but not limited to Residential, Intensive Outpatient, and Outpatient treatment.  Starlette Corsetti will follow up with the services as listed below under Follow Up Information.     Upon completion of this admission the [facility] was both mentally and medically stable for discharge denying suicidal/homicidal ideation,  auditory/visual/tactile hallucinations, delusional thoughts and paranoia.     Physical Findings: AIMS: Facial and Oral Movements Muscles of Facial Expression: None, normal Lips and Perioral Area: None, normal Jaw: None, normal Tongue: None, normal,Extremity Movements Upper (arms, wrists,  hands, fingers): None, normal Lower (legs, knees, ankles, toes): None, normal, Trunk Movements Neck, shoulders, hips: None, normal, Overall Severity Severity of abnormal movements (highest score from questions above): None, normal Incapacitation due to abnormal movements: None, normal Patient's awareness of abnormal movements (rate only patient's report): No Awareness, Dental Status Current problems with teeth and/or dentures?: No Does patient usually wear dentures?: No  CIWA:    COWS:     Musculoskeletal: Strength & Muscle Tone: within normal limits Gait & Station: normal Patient leans: N/A  Psychiatric Specialty Exam:  See MD SRA Review of Systems  All other systems reviewed and are negative.   Blood pressure 105/77, pulse 108, temperature 98.3 F (36.8 C), temperature source Oral, resp. rate 20, height 5\' 4"  (1.626 m), weight 89.812 kg (198 lb), last menstrual period 01/25/2015, SpO2 100 %.Body mass index is 33.97 kg/(m^2).  Have you used any form of tobacco in the last 30 days? (Cigarettes, Smokeless Tobacco, Cigars, and/or Pipes): No  Has this patient used any form of tobacco in the last 30 days? (Cigarettes, Smokeless Tobacco, Cigars, and/or Pipes) Yes, deferred  Metabolic Disorder Labs:  No results found for: HGBA1C, MPG No results found for: PROLACTIN Lab Results  Component Value Date   CHOL 150 02/01/2015   TRIG 75 02/01/2015   HDL 57 02/01/2015   CHOLHDL 2.6 02/01/2015   VLDL 15 02/01/2015   LDLCALC 78 02/01/2015    See Psychiatric Specialty Exam and Suicide Risk Assessment completed by Attending Physician prior to discharge.  Discharge destination:  Home  Is patient on multiple antipsychotic therapies at discharge:  No   Has Patient had three or more failed trials of antipsychotic monotherapy by history:  No  Recommended Plan for Multiple Antipsychotic Therapies: NA     Medication List    STOP taking these medications         diphenhydramine-acetaminophen 25-500 MG Tabs tablet  Commonly known as:  TYLENOL PM     OLANZapine 10 MG tablet  Commonly known as:  ZYPREXA      TAKE these medications      Indication   amantadine 100 MG capsule  Commonly known as:  SYMMETREL  Take 1 capsule (100 mg total) by mouth 2 (two) times daily.   Indication:  Extrapyramidal Reaction caused by Medications     divalproex 500 MG 24 hr tablet  Commonly known as:  DEPAKOTE ER  Take 2 tablets (1,000 mg total) by mouth at bedtime.   Indication:  mood stabilization     mirtazapine 15 MG tablet  Commonly known as:  REMERON  Take 1 tablet (15 mg total) by mouth at bedtime.   Indication:  Trouble Sleeping     ziprasidone 60 MG capsule  Commonly known as:  GEODON  Take 1 capsule (60 mg total) by mouth 2 (two) times daily with a meal.   Indication:  Manic-Depression           Follow-up Information    Follow up with [facility] .   Why:  Call them when you get home to find out about an appointment time and date.  I faxed them the forms they need today.   Contact information:   [address]  [address]  [place]  [  [phone]      Follow-up recommendations:  Activity:  as tol Diet:  as tol  Comments:  1.  Take all your medications as prescribed.              2.  Report any adverse side effects to outpatient provider.                       3.  Patient instructed to not use alcohol or illegal drugs while on prescription medicines.            4.  In the event of worsening symptoms, instructed patient to call 911, the crisis hotline or go to nearest emergency room for evaluation of symptoms.  Signed: Freda Munro May Emily Forse AGNP-BC 02/01/2015, 4:46 PM

## 2015-02-01 NOTE — BHH Suicide Risk Assessment (Signed)
[place] INPATIENT:  Family/Significant Other Suicide Prevention Education  Suicide Prevention Education:  Education Completed; No one has been identified by the patient as the family member/significant other with whom the patient will be residing, and identified as the person(s) who will aid the patient in the event of a mental health crisis (suicidal ideations/suicide attempt).  With written consent from the patient, the family member/significant other has been provided the following suicide prevention education, prior to the and/or following the discharge of the patient.  The suicide prevention education provided includes the following:  Suicide risk factors  Suicide prevention and interventions  National Suicide Hotline telephone number  [facility] assessment telephone number  [facility] Emergency Assistance [place] and/or Residential Mobile Crisis Unit telephone number  Request made of family/significant other to:  Remove weapons (e.g., guns, rifles, knives), all items previously/currently identified as safety concern.    Remove drugs/medications (over-the-counter, prescriptions, illicit drugs), all items previously/currently identified as a safety concern.  The family member/significant other verbalizes understanding of the suicide prevention education information provided.  The family member/significant other agrees to remove the items of safety concern listed above. The patient did not endorse SI at the time of admission, nor did the patient c/o SI during the stay here.  SPE not required.   Roque Lias B 02/01/2015, 10:23 AM

## 2015-02-02 LAB — HEMOGLOBIN A1C
HEMOGLOBIN A1C: 6.4 % — AB (ref 4.8–5.6)
Mean Plasma Glucose: 137 mg/dL

## 2015-02-02 LAB — PROLACTIN: PROLACTIN: 21.6 ng/mL (ref 4.8–23.3)

## 2015-02-07 ENCOUNTER — Encounter (HOSPITAL_COMMUNITY): Payer: Self-pay | Admitting: Emergency Medicine

## 2015-02-07 ENCOUNTER — Emergency Department (HOSPITAL_COMMUNITY): Payer: Medicare Other

## 2015-02-07 ENCOUNTER — Encounter (HOSPITAL_COMMUNITY): Payer: Self-pay

## 2015-02-07 ENCOUNTER — Emergency Department (HOSPITAL_COMMUNITY)
Admission: EM | Admit: 2015-02-07 | Discharge: 2015-02-07 | Disposition: A | Discharge status: Other Acute Inpatient Hospital | Payer: Medicare Other | Attending: Physician Assistant | Admitting: Physician Assistant

## 2015-02-07 ENCOUNTER — Inpatient Hospital Stay (HOSPITAL_COMMUNITY)
Admission: AD | Admit: 2015-02-07 | Discharge: 2015-02-14 | DRG: 885 | Disposition: A | Discharge status: Home / Self Care | Payer: Medicare Other | Source: Intra-hospital | Attending: Psychiatry | Admitting: Psychiatry

## 2015-02-07 DIAGNOSIS — F209 Schizophrenia, unspecified: Secondary | ICD-10-CM | POA: Diagnosis not present

## 2015-02-07 DIAGNOSIS — F250 Schizoaffective disorder, bipolar type: Secondary | ICD-10-CM | POA: Diagnosis present

## 2015-02-07 DIAGNOSIS — R05 Cough: Secondary | ICD-10-CM | POA: Diagnosis not present

## 2015-02-07 DIAGNOSIS — F22 Delusional disorders: Secondary | ICD-10-CM | POA: Diagnosis not present

## 2015-02-07 DIAGNOSIS — Z79899 Other long term (current) drug therapy: Secondary | ICD-10-CM | POA: Insufficient documentation

## 2015-02-07 DIAGNOSIS — F419 Anxiety disorder, unspecified: Secondary | ICD-10-CM | POA: Diagnosis present

## 2015-02-07 DIAGNOSIS — Z3202 Encounter for pregnancy test, result negative: Secondary | ICD-10-CM | POA: Insufficient documentation

## 2015-02-07 DIAGNOSIS — E119 Type 2 diabetes mellitus without complications: Secondary | ICD-10-CM | POA: Diagnosis present

## 2015-02-07 DIAGNOSIS — Z91148 Patient's other noncompliance with medication regimen for other reason: Secondary | ICD-10-CM

## 2015-02-07 DIAGNOSIS — G4700 Insomnia, unspecified: Secondary | ICD-10-CM | POA: Diagnosis present

## 2015-02-07 DIAGNOSIS — R000 Tachycardia, unspecified: Secondary | ICD-10-CM | POA: Diagnosis not present

## 2015-02-07 DIAGNOSIS — Z9114 Patient's other noncompliance with medication regimen: Secondary | ICD-10-CM

## 2015-02-07 DIAGNOSIS — F3111 Bipolar disorder, current episode manic without psychotic features, mild: Secondary | ICD-10-CM | POA: Insufficient documentation

## 2015-02-07 DIAGNOSIS — R410 Disorientation, unspecified: Secondary | ICD-10-CM | POA: Diagnosis present

## 2015-02-07 DIAGNOSIS — F99 Mental disorder, not otherwise specified: Secondary | ICD-10-CM

## 2015-02-07 DIAGNOSIS — N3281 Overactive bladder: Secondary | ICD-10-CM | POA: Diagnosis present

## 2015-02-07 LAB — CBC WITH DIFFERENTIAL/PLATELET
BASOS ABS: 0 10*3/uL (ref 0.0–0.1)
BASOS PCT: 0 %
EOS ABS: 0 10*3/uL (ref 0.0–0.7)
EOS PCT: 0 %
HCT: 35.7 % — ABNORMAL LOW (ref 36.0–46.0)
Hemoglobin: 12.1 g/dL (ref 12.0–15.0)
LYMPHS PCT: 14 %
Lymphs Abs: 0.7 10*3/uL (ref 0.7–4.0)
MCH: 31 pg (ref 26.0–34.0)
MCHC: 33.9 g/dL (ref 30.0–36.0)
MCV: 91.5 fL (ref 78.0–100.0)
MONO ABS: 0.6 10*3/uL (ref 0.1–1.0)
Monocytes Relative: 14 %
Neutro Abs: 3.2 10*3/uL (ref 1.7–7.7)
Neutrophils Relative %: 72 %
PLATELETS: 194 10*3/uL (ref 150–400)
RBC: 3.9 MIL/uL (ref 3.87–5.11)
RDW: 13.5 % (ref 11.5–15.5)
WBC: 4.5 10*3/uL (ref 4.0–10.5)

## 2015-02-07 LAB — COMPREHENSIVE METABOLIC PANEL
ALBUMIN: 4.3 g/dL (ref 3.5–5.0)
ALT: 12 U/L — AB (ref 14–54)
AST: 18 U/L (ref 15–41)
Alkaline Phosphatase: 42 U/L (ref 38–126)
Anion gap: 12 (ref 5–15)
BUN: 11 mg/dL (ref 6–20)
CHLORIDE: 103 mmol/L (ref 101–111)
CO2: 24 mmol/L (ref 22–32)
CREATININE: 1.12 mg/dL — AB (ref 0.44–1.00)
Calcium: 9.8 mg/dL (ref 8.9–10.3)
GFR calc Af Amer: >60 mL/min (ref >60)
Glucose, Bld: 144 mg/dL — ABNORMAL HIGH (ref 65–99)
POTASSIUM: 4.2 mmol/L (ref 3.5–5.1)
SODIUM: 139 mmol/L (ref 135–145)
Total Bilirubin: 0.3 mg/dL (ref 0.3–1.2)
Total Protein: 8.2 g/dL — ABNORMAL HIGH (ref 6.5–8.1)

## 2015-02-07 LAB — RAPID URINE DRUG SCREEN, HOSP PERFORMED
AMPHETAMINES: NOT DETECTED
Barbiturates: NOT DETECTED
Benzodiazepines: NOT DETECTED
COCAINE: NOT DETECTED
OPIATES: NOT DETECTED
TETRAHYDROCANNABINOL: NOT DETECTED

## 2015-02-07 LAB — URINALYSIS, ROUTINE W REFLEX MICROSCOPIC
Bilirubin Urine: NEGATIVE
GLUCOSE, UA: NEGATIVE mg/dL
HGB URINE DIPSTICK: NEGATIVE
KETONES UR: NEGATIVE mg/dL
Leukocytes, UA: NEGATIVE
Nitrite: NEGATIVE
PROTEIN: NEGATIVE mg/dL
Specific Gravity, Urine: 1.014 (ref 1.005–1.030)
pH: 7 (ref 5.0–8.0)

## 2015-02-07 LAB — I-STAT BETA HCG BLOOD, ED (MC, WL, AP ONLY)

## 2015-02-07 LAB — SALICYLATE LEVEL: Salicylate Lvl: <4 mg/dL (ref 2.8–30.0)

## 2015-02-07 LAB — GLUCOSE, CAPILLARY: Glucose-Capillary: 92 mg/dL (ref 65–99)

## 2015-02-07 LAB — ETHANOL

## 2015-02-07 LAB — ACETAMINOPHEN LEVEL: Acetaminophen (Tylenol), Serum: <10 ug/mL — ABNORMAL LOW (ref 10–30)

## 2015-02-07 MED ORDER — DOCUSATE SODIUM 100 MG PO CAPS
100.0000 mg | ORAL_CAPSULE | Freq: Two times a day (BID) | ORAL | Status: DC
  Administered 2015-02-07 – 2015-02-14 (×14): 100 mg via ORAL
  Filled 2015-02-07 (×17): qty 1

## 2015-02-07 MED ORDER — LORAZEPAM 1 MG PO TABS
1.0000 mg | ORAL_TABLET | Freq: Three times a day (TID) | ORAL | Status: DC | PRN
  Administered 2015-02-07 – 2015-02-13 (×2): 1 mg via ORAL
  Filled 2015-02-07 (×2): qty 1

## 2015-02-07 MED ORDER — ONDANSETRON HCL 4 MG PO TABS: 4.0000 mg | ORAL_TABLET | Freq: Three times a day (TID) | ORAL | Status: DC | PRN

## 2015-02-07 MED ORDER — TRAZODONE HCL 100 MG PO TABS
100.0000 mg | ORAL_TABLET | Freq: Every evening | ORAL | Status: DC | PRN
  Administered 2015-02-07: 100 mg via ORAL
  Filled 2015-02-07: qty 1

## 2015-02-07 MED ORDER — MAGNESIUM HYDROXIDE 400 MG/5ML PO SUSP: 30.0000 mL | Freq: Every day | ORAL | Status: DC | PRN

## 2015-02-07 MED ORDER — IBUPROFEN 200 MG PO TABS
600.0000 mg | ORAL_TABLET | Freq: Three times a day (TID) | ORAL | Status: DC | PRN
Start: 2015-02-07 — End: 2015-02-07

## 2015-02-07 MED ORDER — ACETAMINOPHEN 325 MG PO TABS: 650.0000 mg | ORAL_TABLET | ORAL | Status: DC | PRN

## 2015-02-07 MED ORDER — IBUPROFEN 800 MG PO TABS
800.0000 mg | ORAL_TABLET | Freq: Once | ORAL | Status: AC
  Administered 2015-02-07: 800 mg via ORAL
  Filled 2015-02-07: qty 1

## 2015-02-07 MED ORDER — ACETAMINOPHEN 325 MG PO TABS
650.0000 mg | ORAL_TABLET | Freq: Four times a day (QID) | ORAL | Status: DC | PRN
  Administered 2015-02-08: 650 mg via ORAL
  Filled 2015-02-07 (×2): qty 2

## 2015-02-07 MED ORDER — LORAZEPAM 1 MG PO TABS
1.0000 mg | ORAL_TABLET | Freq: Three times a day (TID) | ORAL | Status: DC | PRN
Start: 2015-02-07 — End: 2015-02-07
  Administered 2015-02-07: 1 mg via ORAL
  Filled 2015-02-07: qty 1

## 2015-02-07 MED ORDER — IBUPROFEN 600 MG PO TABS: 600.0000 mg | ORAL_TABLET | Freq: Three times a day (TID) | ORAL | Status: DC | PRN

## 2015-02-07 MED ORDER — CHLORPROMAZINE HCL 25 MG PO TABS: 50.0000 mg | ORAL_TABLET | Freq: Two times a day (BID) | ORAL | Status: DC

## 2015-02-07 MED ORDER — OXCARBAZEPINE 300 MG PO TABS
300.0000 mg | ORAL_TABLET | Freq: Two times a day (BID) | ORAL | Status: DC
  Administered 2015-02-07: 300 mg via ORAL
  Filled 2015-02-07: qty 1

## 2015-02-07 MED ORDER — SODIUM CHLORIDE 0.9 % IV BOLUS (SEPSIS)
1000.0000 mL | Freq: Once | INTRAVENOUS | Status: AC
  Administered 2015-02-07: 1000 mL via INTRAVENOUS

## 2015-02-07 MED ORDER — ALUM & MAG HYDROXIDE-SIMETH 200-200-20 MG/5ML PO SUSP: 30.0000 mL | ORAL | Status: DC | PRN

## 2015-02-07 MED ORDER — ALUM & MAG HYDROXIDE-SIMETH 200-200-20 MG/5ML PO SUSP
30.0000 mL | ORAL | Status: DC | PRN
  Administered 2015-02-11: 30 mL via ORAL
  Filled 2015-02-07: qty 30

## 2015-02-07 MED ORDER — OXCARBAZEPINE 300 MG PO TABS
300.0000 mg | ORAL_TABLET | Freq: Two times a day (BID) | ORAL | Status: DC
  Filled 2015-02-07 (×4): qty 1

## 2015-02-07 MED ORDER — TRAZODONE HCL 100 MG PO TABS
100.0000 mg | ORAL_TABLET | Freq: Every evening | ORAL | Status: DC | PRN
Start: 2015-02-07 — End: 2015-02-07

## 2015-02-07 MED ORDER — LORAZEPAM 1 MG PO TABS: 1.0000 mg | ORAL_TABLET | Freq: Three times a day (TID) | ORAL | Status: DC | PRN

## 2015-02-07 MED ORDER — ZIPRASIDONE HCL 20 MG PO CAPS
60.0000 mg | ORAL_CAPSULE | Freq: Every day | ORAL | Status: DC
  Administered 2015-02-07: 60 mg via ORAL
  Filled 2015-02-07: qty 3

## 2015-02-07 MED ORDER — CHLORPROMAZINE HCL 50 MG PO TABS
50.0000 mg | ORAL_TABLET | Freq: Two times a day (BID) | ORAL | Status: DC
  Filled 2015-02-07: qty 1
  Filled 2015-02-07: qty 2
  Filled 2015-02-07 (×3): qty 1

## 2015-02-07 MED ORDER — NICOTINE 21 MG/24HR TD PT24
21.0000 mg | MEDICATED_PATCH | Freq: Every day | TRANSDERMAL | Status: DC
  Administered 2015-02-11: 21 mg via TRANSDERMAL
  Filled 2015-02-07 (×10): qty 1

## 2015-02-07 MED ORDER — LORAZEPAM 1 MG PO TABS: 1.0000 mg | ORAL_TABLET | Freq: Once | ORAL | Status: DC

## 2015-02-07 NOTE — ED Notes (Signed)
I defer giving her Trileptal and Ativan at this time d/t pt. Drowsiness.

## 2015-02-07 NOTE — ED Notes (Signed)
[name] [name] is here and pt. Is taken to [facility] without incident.

## 2015-02-07 NOTE — Progress Notes (Signed)
Patient ID: [name] [name], female   DOB: 1980/05/08, 35 y.o.   MRN: [id number] D: Patient reports feeling anxious and needed ativan to calm down. Pt reports having side effect from Geodon which brought her back to the hospital to get medication adjusted. Pt refused trileptal after indication and side effects was read to her. Pt reports she want to take as little medication as possible. Pt mood and affect appeared anxious but appears to be interacting well with peers. Pt denies SI/HI/AVH and pain. Pt attended and participated in evening wrap up group. Cooperative with assessment.  A: Met with pt 1:1. Medications administered as prescribed. Support and encouragement provided to attend groups and engage in milieu. Pt encouraged to discuss feelings and come to staff with any question or concerns.  R: Patient remains safe and complaint with medications.

## 2015-02-07 NOTE — Plan of Care (Signed)
Problem: Alteration in mood & ability to function due to Goal: STG-Patient will attend groups Outcome: Progressing Pt attended and evening wrap up group.

## 2015-02-07 NOTE — Progress Notes (Signed)
Patient ID: [name] [name], female   DOB: [birth year], 35 y.o.   MRN: [id number] PER STATE REGULATIONS 482.30  THIS CHART WAS REVIEWED FOR MEDICAL NECESSITY WITH RESPECT TO THE PATIENT'S ADMISSION/DURATION OF STAY.  NEXT REVIEW DATE:02/11/15  Roma Schanz, RN, BSN CASE MANAGER

## 2015-02-07 NOTE — ED Notes (Signed)
She capably ambulates to b.r. And back.  She is fixated on her v.s., blood sugar, etc.
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
## 2015-02-07 NOTE — ED Notes (Signed)
Geodon was given per her request after TTS consult.  She has continual flight-of-ideas ideations.
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
## 2015-02-07 NOTE — Tx Team (Signed)
Initial Interdisciplinary Treatment Plan   PATIENT STRESSORS: Financial difficulties Health problems Marital or family conflict   PATIENT STRENGTHS: Agricultural engineer for treatment/growth Religious Affiliation   PROBLEM LIST: Problem List/Patient Goals Date to be addressed Date deferred Reason deferred Estimated date of resolution  depression  02/07/15     Anxiety 02/07/15     "Medication to help mesleep," 02/07/15     "less stressed and have more fun." 02/07/15                                    DISCHARGE CRITERIA:  Ability to meet basic life and health needs Adequate post-discharge living arrangements Improved stabilization in mood, thinking, and/or behavior  PRELIMINARY DISCHARGE PLAN: Attend aftercare/continuing care group Attend PHP/IOP Attend 12-step recovery group  PATIENT/FAMIILY INVOLVEMENT: This treatment plan has been presented to and reviewed with the patient, [name] [name], and/or family member.  The patient and family have been given the opportunity to ask questions and make suggestions.  [name] 02/07/2015, 5:03 PM

## 2015-02-07 NOTE — ED Notes (Signed)
TTS consult has just been completed.  Meds given.

## 2015-02-07 NOTE — BH Assessment (Signed)
[facility] Assessment Progress Note  Per Corena Pilgrim, MD, this pt requires psychiatric hospitalization at this time.  Letitia Libra, RN, [facility] has assigned pt to [facility] Rm [id number].  Pt has signed Voluntary Admission and Consent for Treatment, as well as Consent to Release Information to no one, and signed forms have been faxed to [facility].  Pt's nurse has been notified, and agrees to send original paperwork along with pt via [facility], and to call report to [phone].  Jalene Mullet, [place] Triage Specialist [phone]

## 2015-02-07 NOTE — Progress Notes (Signed)
Medicaid [place] access states pt pcp is [facility] [place], [place] [id number]

## 2015-02-07 NOTE — ED Notes (Signed)
TTS consult is being performed as I write this.  Meds will be given after consult completed.

## 2015-02-07 NOTE — ED Notes (Signed)
I have just called report to Rubin Payor at [facility] adult unit.  We also have notified [facility] for tx to [facility] adult unit rm. [id number].
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
## 2015-02-07 NOTE — Consult Note (Signed)
[facility]iatry Consult   Reason for Consult: grandiose delusions, psychosis, paranoia Referring Physician:  EDP Patient Identification: [name] [name] MRN:  [id number] Principal Diagnosis: Schizoaffective disorder, bipolar type [facility]) Diagnosis:   Patient Active Problem List   Diagnosis Date Noted  . Schizoaffective disorder, bipolar type ([place]) [F25.0] 01/28/2015    Priority: High  . Non compliance w medication regimen [Z91.14]     Total Time spent with patient: 45 minutes  Subjective:   [name] [name] is a 35 y.o. female patient admitted with paranoia, psychosis.  HPI: Patient is a 35 year old female with history of Schizoaffective disorder, bipolar disorder who was recently discharged from [facility]. Patient reports that she has not been sleeping for days and as a result has been having paranoid thoughts. Patient also endorses hearing voices of God, giggling voices and dogs barking. Patient is grandiose, she belives she is a  Prophet, having racing thoughts and feels irritable due to lack of sleep.   Past Psychiatric History: Bipolar disorder  Risk to Self: Suicidal Ideation: No Suicidal Intent: No Is patient at risk for suicide?: No Suicidal Plan?: No Access to Means: No What has been your use of drugs/alcohol within the last 12 months?: pt denies How many times?: 0 Other Self Harm Risks: 0 Triggers for Past Attempts: None known Intentional Self Injurious Behavior: None Risk to Others: Homicidal Ideation: No Thoughts of Harm to Others: No Current Homicidal Intent: No Current Homicidal Plan: No Access to Homicidal Means: No History of harm to others?: No Assessment of Violence: None Noted Violent Behavior Description: Pt appears very calm  Does patient have access to weapons?: No Criminal Charges Pending?: No Does patient have a court date: No Prior Inpatient Therapy: Prior Inpatient Therapy: Yes Prior Therapy Dates: 05/2014; 06/2014; 01/2015 Prior  Therapy Facilty/Provider(s): Biagio Quint; [facility] Reason for Treatment: Schizoaffective Disorder  Prior Outpatient Therapy: Prior Outpatient Therapy: No Does patient have an ACCT team?: No Does patient have Intensive In-House Services?  : No Does patient have [facility] services? : Unknown Does patient have P4CC services?: No  Past Medical History:  Past Medical History  Diagnosis Date  . Diabetes mellitus without complication ([place])   . Schizophrenia ([place])   . Bipolar affective disorder, currently manic, mild ([place])    History reviewed. No pertinent past surgical history. Family History:  Family History  Problem Relation Age of Onset  . Drug abuse Maternal Uncle    Family Psychiatric  History:  Social History:  History  Alcohol Use No     History  Drug Use Not on file    Social History   Social History  . Marital Status: Legally Separated    Spouse Name: N/A  . Number of Children: N/A  . Years of Education: N/A   Social History Main Topics  . Smoking status: Never Smoker   . Smokeless tobacco: None  . Alcohol Use: No  . Drug Use: None  . Sexual Activity: Not Asked   Other Topics Concern  . None   Social History Narrative   Additional Social History:    Allergies:  No Known Allergies  Labs:  Results for orders placed or performed during the hospital encounter of 02/07/15 (from the past 48 hour(s))  CBC with Differential/Platelet     Status: Abnormal   Collection Time: 02/07/15  7:26 AM  Result Value Ref Range   WBC 4.5 4.0 - 10.5 K/uL   RBC 3.90 3.87 - 5.11 MIL/uL   Hemoglobin 12.1 12.0 - 15.0 g/dL  HCT 35.7 (L) 36.0 - 46.0 %   MCV 91.5 78.0 - 100.0 fL   MCH 31.0 26.0 - 34.0 pg   MCHC 33.9 30.0 - 36.0 g/dL   RDW 13.5 11.5 - 15.5 %   Platelets 194 150 - 400 K/uL   Neutrophils Relative % 72 %   Neutro Abs 3.2 1.7 - 7.7 K/uL   Lymphocytes Relative 14 %   Lymphs Abs 0.7 0.7 - 4.0 K/uL   Monocytes Relative 14 %   Monocytes Absolute 0.6 0.1 - 1.0  K/uL   Eosinophils Relative 0 %   Eosinophils Absolute 0.0 0.0 - 0.7 K/uL   Basophils Relative 0 %   Basophils Absolute 0.0 0.0 - 0.1 K/uL  Comprehensive metabolic panel     Status: Abnormal   Collection Time: 02/07/15  7:26 AM  Result Value Ref Range   Sodium 139 135 - 145 mmol/L   Potassium 4.2 3.5 - 5.1 mmol/L   Chloride 103 101 - 111 mmol/L   CO2 24 22 - 32 mmol/L   Glucose, Bld 144 (H) 65 - 99 mg/dL   BUN 11 6 - 20 mg/dL   Creatinine, Ser 1.12 (H) 0.44 - 1.00 mg/dL   Calcium 9.8 8.9 - 10.3 mg/dL   Total Protein 8.2 (H) 6.5 - 8.1 g/dL   Albumin 4.3 3.5 - 5.0 g/dL   AST 18 15 - 41 U/L   ALT 12 (L) 14 - 54 U/L   Alkaline Phosphatase 42 38 - 126 U/L   Total Bilirubin 0.3 0.3 - 1.2 mg/dL   GFR calc non Af Amer >60 >60 mL/min   GFR calc Af Amer >60 >60 mL/min    Comment: (NOTE) The eGFR has been calculated using the CKD EPI equation. This calculation has not been validated in all clinical situations. eGFR's persistently <60 mL/min signify possible Chronic Kidney Disease.    Anion gap 12 5 - 15  Ethanol     Status: None   Collection Time: 02/07/15  7:26 AM  Result Value Ref Range   Alcohol, Ethyl (B) <5 <5 mg/dL    Comment:        LOWEST DETECTABLE LIMIT FOR SERUM ALCOHOL IS 5 mg/dL FOR MEDICAL PURPOSES ONLY   Salicylate level     Status: None   Collection Time: 02/07/15  7:26 AM  Result Value Ref Range   Salicylate Lvl <7.5 2.8 - 30.0 mg/dL  Acetaminophen level     Status: Abnormal   Collection Time: 02/07/15  7:26 AM  Result Value Ref Range   Acetaminophen (Tylenol), Serum <10 (L) 10 - 30 ug/mL    Comment:        THERAPEUTIC CONCENTRATIONS VARY SIGNIFICANTLY. A RANGE OF 10-30 ug/mL MAY BE AN EFFECTIVE CONCENTRATION FOR MANY PATIENTS. HOWEVER, SOME ARE BEST TREATED AT CONCENTRATIONS OUTSIDE THIS RANGE. ACETAMINOPHEN CONCENTRATIONS >150 ug/mL AT 4 HOURS AFTER INGESTION AND >50 ug/mL AT 12 HOURS AFTER INGESTION ARE OFTEN ASSOCIATED WITH TOXIC REACTIONS.    I-Stat Beta hCG blood, ED ([facility], [facility], [facility] only)     Status: None   Collection Time: 02/07/15  8:20 AM  Result Value Ref Range   I-stat hCG, quantitative <5.0 <5 mIU/mL   Comment 3            Comment:   GEST. AGE      CONC.  (mIU/mL)   <=1 WEEK        5 - 50     2 WEEKS  50 - 500     3 WEEKS       100 - 10,000     4 WEEKS     1,000 - 30,000        FEMALE AND NON-PREGNANT FEMALE:     LESS THAN 5 mIU/mL   Urine rapid drug screen (hosp performed)     Status: None   Collection Time: 02/07/15  8:36 AM  Result Value Ref Range   Opiates NONE DETECTED NONE DETECTED   Cocaine NONE DETECTED NONE DETECTED   Benzodiazepines NONE DETECTED NONE DETECTED   Amphetamines NONE DETECTED NONE DETECTED   Tetrahydrocannabinol NONE DETECTED NONE DETECTED   Barbiturates NONE DETECTED NONE DETECTED    Comment:        DRUG SCREEN FOR MEDICAL PURPOSES ONLY.  IF CONFIRMATION IS NEEDED FOR ANY PURPOSE, NOTIFY LAB WITHIN 5 DAYS.        LOWEST DETECTABLE LIMITS FOR URINE DRUG SCREEN Drug Class       Cutoff (ng/mL) Amphetamine      1000 Barbiturate      200 Benzodiazepine   697 Tricyclics       948 Opiates          300 Cocaine          300 THC              50     Current Facility-Administered Medications  Medication Dose Route Frequency Provider Last Rate Last Dose  . acetaminophen (TYLENOL) tablet 650 mg  650 mg Oral Q4H PRN Courteney Lyn Mackuen, MD      . chlorproMAZINE (THORAZINE) tablet 50 mg  50 mg Oral BID PC Vannesa Abair, MD      . ibuprofen (ADVIL,MOTRIN) tablet 600 mg  600 mg Oral Q8H PRN Courteney Lyn Mackuen, MD      . LORazepam (ATIVAN) tablet 1 mg  1 mg Oral Q8H PRN Courteney Lyn Mackuen, MD   1 mg at 02/07/15 0948  . Oxcarbazepine (TRILEPTAL) tablet 300 mg  300 mg Oral BID Corena Pilgrim, MD      . traZODone (DESYREL) tablet 100 mg  100 mg Oral QHS PRN Corena Pilgrim, MD       Current Outpatient Prescriptions  Medication Sig Dispense Refill  . amantadine (SYMMETREL) 100 MG  capsule Take 1 capsule (100 mg total) by mouth 2 (two) times daily. 60 capsule 0  . divalproex (DEPAKOTE ER) 500 MG 24 hr tablet Take 2 tablets (1,000 mg total) by mouth at bedtime. 60 tablet 0  . mirtazapine (REMERON) 15 MG tablet Take 1 tablet (15 mg total) by mouth at bedtime. 30 tablet 0  . OLANZapine (ZYPREXA) 10 MG tablet Take 10 mg by mouth at bedtime.    . ziprasidone (GEODON) 60 MG capsule Take 1 capsule (60 mg total) by mouth 2 (two) times daily with a meal. 60 capsule 0    Musculoskeletal: Strength & Muscle Tone: within normal limits Gait & Station: normal Patient leans: N/A  Psychiatric Specialty Exam: Review of Systems  Constitutional: Positive for malaise/fatigue.  HENT: Negative.   Eyes: Negative.   Respiratory: Negative.   Cardiovascular: Negative.   Gastrointestinal: Negative.   Genitourinary: Negative.   Musculoskeletal: Positive for myalgias.  Skin: Negative.   Neurological: Positive for weakness.  Endo/Heme/Allergies: Negative.   Psychiatric/Behavioral: Positive for hallucinations. The patient is nervous/anxious and has insomnia.     Blood pressure 132/105, pulse 118, temperature 99.1 F (37.3 C), temperature source Oral, resp. rate 20, last  menstrual period 01/25/2015, SpO2 100 %.There is no weight on file to calculate BMI.  General Appearance: Casual  Eye Contact::  Minimal  Speech:  Pressured  Volume:  Normal  Mood:  Irritable  Affect:  Labile  Thought Process:  Circumstantial and Disorganized  Orientation:  Full (Time, Place, and Person)  Thought Content:  Delusions and grandiose  Suicidal Thoughts:  No  Homicidal Thoughts:  No  Memory:  Immediate;   Fair Recent;   Good Remote;   Good  Judgement:  Impaired  Insight:  Lacking  Psychomotor Activity:  Increased  Concentration:  Fair  Recall:  [employer] of Knowledge:Fair  Language: Good  Akathisia:  No  Handed:  Right  AIMS (if indicated):     Assets:  Communication Skills Desire for  Improvement  ADL's:  Intact  Cognition: WNL  Sleep:   poor   Treatment Plan Summary: Daily contact with patient to assess and evaluate symptoms and progress in treatment and Medication management  -Start Trileptal 316m bid for mood stabilization -Thorazine 549mbid for psychosis/delusions -Trazodone 10057mhs prn for insomnia  Disposition: Recommend psychiatric Inpatient admission when medically cleared.  AkiCorena PilgrimD 02/07/2015 11:09 AM

## 2015-02-07 NOTE — Progress Notes (Signed)
Adult Psychoeducational Group Note  Date:  02/07/2015 Time:  9:25 PM  Group Topic/Focus:  Wrap-Up Group:   The focus of this group is to help patients review their daily goal of treatment and discuss progress on daily workbooks.  Participation Level:  Active  Participation Quality:  Appropriate  Affect:  Appropriate  Cognitive:  Alert  Insight: Appropriate  Engagement in Group:  Engaged  Modes of Intervention:  Discussion  Additional Comments:  Patient goal for today was to be honest with herself and to trust God. On a scale between 1-10, (1=worse, 10=best) patient rated her day as a 10 because "I came here".  [name] [name] [name] 02/07/2015, 9:25 PM
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
## 2015-02-08 ENCOUNTER — Encounter (HOSPITAL_COMMUNITY): Payer: Self-pay | Admitting: Psychiatry

## 2015-02-08 DIAGNOSIS — E119 Type 2 diabetes mellitus without complications: Secondary | ICD-10-CM

## 2015-02-08 DIAGNOSIS — Z9114 Patient's other noncompliance with medication regimen: Secondary | ICD-10-CM

## 2015-02-08 LAB — URINE CULTURE

## 2015-02-08 LAB — URINALYSIS, ROUTINE W REFLEX MICROSCOPIC
Bilirubin Urine: NEGATIVE
GLUCOSE, UA: NEGATIVE mg/dL
Hgb urine dipstick: NEGATIVE
Ketones, ur: NEGATIVE mg/dL
LEUKOCYTES UA: NEGATIVE
Nitrite: NEGATIVE
PH: 6 (ref 5.0–8.0)
Protein, ur: NEGATIVE mg/dL
SPECIFIC GRAVITY, URINE: 1.022 (ref 1.005–1.030)

## 2015-02-08 LAB — GLUCOSE, CAPILLARY
GLUCOSE-CAPILLARY: 141 mg/dL — AB (ref 65–99)
GLUCOSE-CAPILLARY: 131 mg/dL — AB (ref 65–99)
Glucose-Capillary: 172 mg/dL — ABNORMAL HIGH (ref 65–99)

## 2015-02-08 MED ORDER — INSULIN ASPART 100 UNIT/ML ~~LOC~~ SOLN
0.0000 [IU] | Freq: Three times a day (TID) | SUBCUTANEOUS | Status: DC
  Administered 2015-02-08: 3 [IU] via SUBCUTANEOUS
  Administered 2015-02-13: 2 [IU] via SUBCUTANEOUS

## 2015-02-08 MED ORDER — IBUPROFEN 600 MG PO TABS
600.0000 mg | ORAL_TABLET | Freq: Four times a day (QID) | ORAL | Status: DC
  Administered 2015-02-08 – 2015-02-12 (×14): 600 mg via ORAL
  Filled 2015-02-08 (×21): qty 1

## 2015-02-08 MED ORDER — OLANZAPINE 5 MG PO TBDP: 5.0000 mg | ORAL_TABLET | Freq: Four times a day (QID) | ORAL | Status: DC | PRN

## 2015-02-08 MED ORDER — ARIPIPRAZOLE 5 MG PO TABS
5.0000 mg | ORAL_TABLET | Freq: Every day | ORAL | Status: DC
  Administered 2015-02-08 – 2015-02-13 (×6): 5 mg via ORAL
  Filled 2015-02-08 (×8): qty 1

## 2015-02-08 MED ORDER — ZOLPIDEM TARTRATE 5 MG PO TABS
5.0000 mg | ORAL_TABLET | Freq: Every day | ORAL | Status: DC
  Administered 2015-02-09 – 2015-02-13 (×5): 5 mg via ORAL
  Filled 2015-02-08 (×6): qty 1

## 2015-02-08 MED ORDER — BENZTROPINE MESYLATE 0.5 MG PO TABS
0.5000 mg | ORAL_TABLET | Freq: Every day | ORAL | Status: DC
  Administered 2015-02-08 – 2015-02-13 (×6): 0.5 mg via ORAL
  Filled 2015-02-08 (×8): qty 1

## 2015-02-08 MED ORDER — HYDROXYZINE HCL 25 MG PO TABS: 25.0000 mg | ORAL_TABLET | Freq: Four times a day (QID) | ORAL | Status: DC | PRN

## 2015-02-08 MED ORDER — DIVALPROEX SODIUM ER 250 MG PO TB24
750.0000 mg | ORAL_TABLET | Freq: Every day | ORAL | Status: DC
  Administered 2015-02-08 – 2015-02-13 (×6): 750 mg via ORAL
  Filled 2015-02-08 (×8): qty 3

## 2015-02-08 MED ORDER — METFORMIN HCL 500 MG PO TABS
500.0000 mg | ORAL_TABLET | Freq: Every day | ORAL | Status: DC
  Administered 2015-02-08 – 2015-02-14 (×7): 500 mg via ORAL
  Filled 2015-02-08 (×9): qty 1

## 2015-02-08 NOTE — Tx Team (Signed)
Interdisciplinary Treatment Plan Update (Adult)  Date:  02/08/2015   Time Reviewed:  9:02 AM   Progress in Treatment: Attending groups: Pt is new to milieu, continuing to assess . Participating in groups:  Pt is new to milieu, continuing to assess . Taking medication as prescribed:  Yes. Tolerating medication:  Yes. Family/Significant other contact made:  No Patient understands diagnosis:  Yes  As evidenced by seeking help with "getting on my meds again" Discussing patient identified problems/goals with staff:  Yes, see initial care plan. Medical problems stabilized or resolved:  Yes. Denies suicidal/homicidal ideation: Yes. Issues/concerns per patient self-inventory:  No. Other:  New problem(s) identified: None identified at this time.   Discharge Plan or Barriers: return home, follow up outpt  Reason for Continuation of Hospitalization: Mania Medication stabilization  Comments:  Pt recently discharged from [facility] and has returned due to rapid decompensation with tangential and pressured speech and delusional thought content.  Medication trial of Thorazine 41m bid and Trileptal 3075mbid  Estimated length of stay: 5-7 days  New goal(s):  Review of initial/current patient goals per problem list:   Review of initial/current patient goals per problem list:  1. Goal(s): Patient will participate in aftercare plan   Met: Yes   Target date: 3-5 days post admission date   As evidenced by: Patient will participate within aftercare plan AEB aftercare provider and housing plan at discharge being identified. 02/08/15:  Return home, follow up outpt       5. Goal(s): Patient will demonstrate decreased signs of psychosis  * Met: No  * Target date: 3-5 days post admission date  * As evidenced by: Patient will demonstrate decreased frequency of AVH or return to baseline function 02/08/15: Pt recently admitted with delusional thought content.    6. Goal (s): Patient will  demonstrate decreased signs of mania  * Met: No  * Target date: 3-5 days post admission date  * As evidenced by: Patient demonstrate decreased signs of mania AEB decreased mood instability and return to baseline functioning 02/08/15: Pt recently admitted with tangential and pressured speech and delusional thought content     Attendees: Patient:  02/08/2015 9:02 AM   Family:   02/08/2015 9:02 AM   Physician:  SaUrsula AlertMD 02/08/2015 9:02 AM   Nursing:  ElHedy Jacob RN; LaIdell PicklesRN 02/08/2015 9:02 AM   CSW:    RoRoque LiasLCSW   02/08/2015 9:02 AM   Other:  02/08/2015 9:02 AM   Other:   02/08/2015 9:02 AM   Other:  JeLars PinksNurse CM 02/08/2015 9:02 AM   Other:   02/08/2015 9:02 AM   Other:  DeNorberto SorensonP4[place]2/02/2015 9:02 AM   Other:  02/08/2015 9:02 AM   Other:  02/08/2015 9:02 AM   Other:  02/08/2015 9:02 AM   Other:  02/08/2015 9:02 AM   Other:  02/08/2015 9:02 AM   Other:   02/08/2015 9:02 AM    Scribe for Treatment Team:   LaPeri MarisLC[place]elle[place] [id number]/02/2015 9:02 AM

## 2015-02-08 NOTE — Progress Notes (Signed)
DAR NOTE: Patient presents with anxious affect and depressed mood.  Denies pain, auditory and visual hallucinations.  Rates depression at 0, hopelessness at 0, and anxiety at 0.  Describes energy level as low and concentration as good.  Maintained on routine safety checks.  Medications given as prescribed.  Support and encouragement offered as needed.  Attended group and participated.  States goal for today is "arr[place] ride to hometown."  Patient observed socializing with peers in the dayroom.  Motrin 600 mg given for elevated temperature of 102.1.  A repeat temperature of 98. 6 obtained.  Patient reports feeling better and no more discomfort noted or reported.

## 2015-02-08 NOTE — [facility] Group Notes (Signed)
Pt did not attend [place] group.  Victorino Sparrow, MHT

## 2015-02-08 NOTE — Progress Notes (Signed)
D: Pt denies SI/HI/AVH. Pt is pleasant and cooperative. Pt stayed in bed duration . Pt fowarded little information.   A: Pt was offered support and encouragement. Pt was given scheduled medications. Pt was encourage to attend groups. Q 15 minute checks were done for safety.   R:Pt attends groups and interacts well with peers and staff. Pt is taking medication. Pt has no complaints at this time .Pt receptive to treatment and safety maintained on unit.

## 2015-02-08 NOTE — BHH Suicide Risk Assessment (Signed)
[facility] INPATIENT:  Family/Significant Other Suicide Prevention Education  Suicide Prevention Education:  Pt did not present with SI at time of admission and has remained free of SI throughout hospitalization. However, consent was given to speak with [name], Pt's friend, at [phone]. CSW spoke with Mrs. Booker who expressed that Pt has been "changing her plans" multiple times a day and is constantly changing her mind about what she would like to do about discharge planning. Per Mrs. Booker, Pt is not the best historian and Mrs. Gertie Exon was not able to confirm Pt's plan to go back to [facility] to live with her husband. Mrs. Booker expressed that she would not be able to take Pt to [facility] at this time.   Bo Mcclintock 02/08/2015, 11:43 AM
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
## 2015-02-08 NOTE — BHH Group Notes (Signed)
[facility] Group Notes:  (Nursing/MHT/Case Management/Adjunct)  Date:  02/08/2015  Time:  2:50 PM  Type of Therapy:  Nurse Education  Participation Level:  Did Not Attend    Mart Piggs 02/08/2015, 2:50 PM

## 2015-02-08 NOTE — BHH Suicide Risk Assessment (Signed)
[facility] Admission Suicide Risk Assessment   Nursing information obtained from:    Demographic factors:    Current Mental Status:    Loss Factors:    Historical Factors:    Risk Reduction Factors:     Total Time spent with patient: 30 minutes Principal Problem: Schizoaffective disorder, bipolar type ([place]) Diagnosis:   Patient Active Problem List   Diagnosis Date Noted  . Diabetes mellitus ([place]) [E11.9] 02/08/2015  . Schizoaffective disorder, bipolar type ([place]) [F25.0] 01/28/2015  . Non compliance w medication regimen [Z91.14]    Subjective Data: Please see H&P.   Continued Clinical Symptoms:  Alcohol Use Disorder Identification Test Final Score (AUDIT): 0 The "Alcohol Use Disorders Identification Test", Guidelines for Use in Primary Care, Second Edition.  World Pharmacologist [facility]). Score between 0-7:  no or low risk or alcohol related problems. Score between 8-15:  moderate risk of alcohol related problems. Score between 16-19:  high risk of alcohol related problems. Score 20 or above:  warrants further diagnostic evaluation for alcohol dependence and treatment.   CLINICAL FACTORS:   Unstable or Poor Therapeutic Relationship Previous Psychiatric Diagnoses and Treatments   Psychiatric Specialty Exam: Review of Systems  Musculoskeletal: Positive for back pain.  Psychiatric/Behavioral: Positive for depression. The patient is nervous/anxious and has insomnia.   All other systems reviewed and are negative.   Blood pressure 123/78, pulse 110, temperature 97.6 F (36.4 C), temperature source Oral, resp. rate 16, height 5' 3.5" (1.613 m), weight 90.719 kg (200 lb), last menstrual period 01/25/2015.Body mass index is 34.87 kg/(m^2).  Please see H&P.                                                       COGNITIVE FEATURES THAT CONTRIBUTE TO RISK:  Closed-mindedness, Polarized thinking and Thought constriction (tunnel vision)    SUICIDE RISK:   Mild:   Suicidal ideation of limited frequency, intensity, duration, and specificity.  There are no identifiable plans, no associated intent, mild dysphoria and related symptoms, good self-control (both objective and subjective assessment), few other risk factors, and identifiable protective factors, including available and accessible social support.  PLAN OF CARE: Please see H&P.   I certify that inpatient services furnished can reasonably be expected to improve the patient's condition.   Verlia Kaney, MD 02/08/2015, 12:34 PM
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
## 2015-02-08 NOTE — BHH Group Notes (Signed)
[facility] LCSW Group Therapy  02/08/2015 1:15 pm  Type of Therapy: Process Group Therapy  Participation Level:  Active  Participation Quality:  Appropriate  Affect:  Flat  Cognitive:  Oriented  Insight:  Improving  Engagement in Group:  Limited  Engagement in Therapy:  Limited  Modes of Intervention:  Activity, Clarification, Education, Problem-solving and Support  Summary of Progress/Problems: Today's group addressed the issue of overcoming obstacles.  Patients were asked to identify their biggest obstacle post d/c that stands in the way of their on-going success, and then problem solve as to how to manage this. "My biggest obstacle outside of here is my husband and our relationship.  We have been separated for about a year-got married back in 2011.  He just re-established communication with me, and it sounds like it is time to reunite.  But he is living in a homeless shelter, and there would be some challenges.  And then there are the kids.  I'm not sure if they see me as mommy. I have waited over a year for my section 8 housing, and this Wed it is finally coming through. I can't wait to reunite our family and be in our own place." Was given feedback by others that perhaps she needs to evaluate further before committing to reunification-spending more time focusing on self.  Had a difficult time hearing this.  Roque Lias B 02/08/2015   1:42 PM

## 2015-02-09 LAB — GLUCOSE, CAPILLARY
GLUCOSE-CAPILLARY: 123 mg/dL — AB (ref 65–99)
Glucose-Capillary: 101 mg/dL — ABNORMAL HIGH (ref 65–99)
Glucose-Capillary: 174 mg/dL — ABNORMAL HIGH (ref 65–99)
Glucose-Capillary: 102 mg/dL — ABNORMAL HIGH (ref 65–99)

## 2015-02-09 NOTE — Progress Notes (Signed)
Inpatient Diabetes Program Recommendations  AACE/ADA: New Consensus Statement on Inpatient Glycemic Control (2015)  Target Ranges:  Prepandial:   less than 140 mg/dL      Peak postprandial:   less than 180 mg/dL (1-2 hours)      Critically ill patients:  140 - 180 mg/dL   Results for [name] [name], [name] [name] (MRN [id number]) as of 02/09/2015 21:37  Ref. Range 02/09/2015 06:05 02/09/2015 11:58 02/09/2015 17:12  Glucose-Capillary Latest Ref Range: 65-99 mg/dL 123 (H) 101 (H) 174 (H)   Results for [name] [name], [name] [name] (MRN [id number]) as of 02/09/2015 21:37  Ref. Range 02/01/2015 06:20  Hemoglobin A1C Latest Ref Range: 4.8-5.6 % 6.4 (H)    Admit with: Schizoaffective Disorder  History: DM  Home DM Meds: None  Current Insulin Orders: Novolog Moderate SSI (0-15 units) TID AC        Metformin 500 mg daily     MD- Note Novolog Moderate SSI and Metformin initiated yesterday (02/02).  Glucose levels well controlled on current in-hospital regimen.  No further recommendations at this time.     --Will follow patient during hospitalization--  Wyn Quaker RN, MSN, CDE Diabetes Coordinator Inpatient Glycemic Control Team Team Pager: [phone] (8a-5p)

## 2015-02-09 NOTE — BHH Group Notes (Signed)
[facility] LCSW Group Therapy   02/09/2015 1:48 PM  Type of Therapy: Group Therapy  Participation Level:  Active  Participation Quality:  Attentive  Affect:  Flat  Cognitive:  Oriented  Insight:  Limited  Engagement in Therapy:  Engaged  Modes of Intervention:  Discussion and Socialization  Summary of Progress/Problems: Chaplain was here to lead a group on themes of hope and/or courage.  "I encourage myself by writing poems and writing songs. If you don't do anything to busy yourself then you're going to lose sight of your personal hope." Pt also spoke about a scripture from the Bible that was encouraging to her and mentioned that she reminds herself of that scripture when she needs hope. Pt was tangential and exhibited pressured speech.  Georga Kaufmann 02/09/2015 1:48 PM

## 2015-02-09 NOTE — Progress Notes (Signed)
CSW and MD met with pt in her room this morning. Pt depressed and tearful regarding her inability to reach her husband. "He's really mad at me." Pt given message from a friend that left voicemail with CSW and CSW encouraged pt to call her friend who has update about her husband and would like to be a support for her. Pt agreeable to continuing medication management and counseling at the [facility]. Pt reports feeling hopeless about current situation/joblessness and possible homelessness. She is worried about having possible DUI charge from accident the other night. CSW assessing.  Maxie Better, MSW, LCSW Clinical Social Worker 02/09/2015 11:52 AM

## 2015-02-09 NOTE — Plan of Care (Signed)
Problem: Alteration in mood Goal: LTG-Patient reports reduction in suicidal thoughts (Patient reports reduction in suicidal thoughts and is able to verbalize a safety plan for whenever patient is feeling suicidal)  Outcome: Progressing Pt denies SI at this time     

## 2015-02-09 NOTE — BHH Group Notes (Signed)
[facility] LCSW Aftercare Discharge Planning Group Note   02/09/2015 10:56 AM  Participation Quality:  Engaged  Mood/Affect:  Flat  Depression Rating:  denies  Anxiety Rating:  denies  Thoughts of Suicide:  No Will you contract for safety?   NA  Current AVH:  No  Plan for Discharge/Comments:  "I have taken the advice in group yesterday to heart.  I will not reunite with my husband until I am sure it is the right thing to do.  In the meantime, I will be going directly to [facility] the same day I am discharged.  I have money for the bus.  When I get there I will stay with my brother or the [place] homeless shelter until I can get into my place.  And I want to follow up with my old providers.   Transportation Means: bus passes  Supports: family in [place], [place]

## 2015-02-09 NOTE — Progress Notes (Signed)
[facility] MD Progress Note  02/09/2015 1:55 PM [name] [name]  MRN:  [id number] Subjective:  Patient states " I am better today ."  Objective:[name] [name] is a 35 y.o.AA female , separated , on SSD , currently lives with a pastor , with whom she moved to [place] from [place] recently, has a hx of schizoaffective do, presented very disorganzied. Patient seen and chart reviewed.Discussed patient with treatment team.  Pt today seen as less anxious than yesterday , reports her paranoia is improving. Pt reports she is not too focussed on her delusional thoughts about her pastor boyfriend any more. Pt is tolerating medications well per staff. Has had no disruptive issues noted on the unit.    Principal Problem: Schizoaffective disorder, bipolar type ([place]) Diagnosis:   Patient Active Problem List   Diagnosis Date Noted  . Diabetes mellitus ([place]) [E11.9] 02/08/2015  . Schizoaffective disorder, bipolar type ([place]) [F25.0] 01/28/2015  . Non compliance w medication regimen [Z91.14]    Total Time spent with patient: 30 minutes  Past Psychiatric History: :Pt has a hx of schizoaffective do, bipolar type, had several admissions in [place] as well as [facility] [place]. Pt denies past hx of suicide attempts  Past Medical History:  Past Medical History  Diagnosis Date  . Diabetes mellitus without complication ([place])   . Schizophrenia ([place])   . Bipolar affective disorder, currently manic, mild ([place])     Family History: Denies hx of DM, HTN,heart disease, thyroid disease in family Family History  Problem Relation Age of Onset  . Drug abuse Maternal Uncle    Family Psychiatric  History: as described above.  Social History: Pt is separated , lives with a pastor with whom she were having a relationship with , is on SSD History  Alcohol Use No     History  Drug Use Not on file    Social History   Social History  . Marital Status: Legally Separated    Spouse Name: N/A  . Number of  Children: N/A  . Years of Education: N/A   Social History Main Topics  . Smoking status: Never Smoker   . Smokeless tobacco: None  . Alcohol Use: No  . Drug Use: None  . Sexual Activity: Not Asked   Other Topics Concern  . None   Social History Narrative   Additional Social History:    Pain Medications: see PTA  Prescriptions: see PTA  Over the Counter: see PTA  History of alcohol / drug use?: No history of alcohol / drug abuse                    Sleep: Fair  Appetite:  Fair  Current Medications: Current Facility-Administered Medications  Medication Dose Route Frequency Provider Last Rate Last Dose  . alum & mag hydroxide-simeth (MAALOX/MYLANTA) 200-200-20 MG/5ML suspension 30 mL  30 mL Oral Q4H PRN Delfin Gant, NP      . ARIPiprazole (ABILIFY) tablet 5 mg  5 mg Oral QHS Ursula Alert, MD   5 mg at 02/08/15 2208  . benztropine (COGENTIN) tablet 0.5 mg  0.5 mg Oral QHS Ursula Alert, MD   0.5 mg at 02/08/15 2208  . divalproex (DEPAKOTE ER) 24 hr tablet 750 mg  750 mg Oral QHS Ursula Alert, MD   750 mg at 02/08/15 2208  . docusate sodium (COLACE) capsule 100 mg  100 mg Oral BID Kerrie Buffalo, NP   100 mg at 02/09/15 [id number]  . hydrOXYzine (ATARAX/VISTARIL) tablet  25 mg  25 mg Oral Q6H PRN Ursula Alert, MD      . ibuprofen (ADVIL,MOTRIN) tablet 600 mg  600 mg Oral QID Ursula Alert, MD   600 mg at 02/09/15 1318  . insulin aspart (novoLOG) injection 0-15 Units  0-15 Units Subcutaneous TID WC Ursula Alert, MD   3 Units at 02/08/15 1235  . LORazepam (ATIVAN) tablet 1 mg  1 mg Oral Q8H PRN Delfin Gant, NP   1 mg at 02/07/15 1952  . magnesium hydroxide (MILK OF MAGNESIA) suspension 30 mL  30 mL Oral Daily PRN Delfin Gant, NP      . metFORMIN (GLUCOPHAGE) tablet 500 mg  500 mg Oral Q breakfast Ursula Alert, MD   500 mg at 02/09/15 0918  . nicotine (NICODERM CQ - dosed in mg/24 hours) patch 21 mg  21 mg Transdermal Daily Patrecia Pour, NP   21 mg  at 02/07/15 1716  . OLANZapine zydis (ZYPREXA) disintegrating tablet 5 mg  5 mg Oral Q6H PRN Ursula Alert, MD      . ondansetron (ZOFRAN) tablet 4 mg  4 mg Oral Q8H PRN Patrecia Pour, NP      . zolpidem (AMBIEN) tablet 5 mg  5 mg Oral QHS Ursula Alert, MD   5 mg at 02/08/15 2200    Lab Results:  Results for orders placed or performed during the hospital encounter of 02/07/15 (from the past 48 hour(s))  Glucose, capillary     Status: None   Collection Time: 02/07/15  8:04 PM  Result Value Ref Range   Glucose-Capillary 92 65 - 99 mg/dL  Urinalysis, Routine w reflex microscopic (not at [facility])     Status: Abnormal   Collection Time: 02/07/15  8:17 PM  Result Value Ref Range   Color, Urine YELLOW YELLOW   APPearance CLOUDY (A) CLEAR   Specific Gravity, Urine 1.022 1.005 - 1.030   pH 6.0 5.0 - 8.0   Glucose, UA NEGATIVE NEGATIVE mg/dL   Hgb urine dipstick NEGATIVE NEGATIVE   Bilirubin Urine NEGATIVE NEGATIVE   Ketones, ur NEGATIVE NEGATIVE mg/dL   Protein, ur NEGATIVE NEGATIVE mg/dL   Nitrite NEGATIVE NEGATIVE   Leukocytes, UA NEGATIVE NEGATIVE    Comment: MICROSCOPIC NOT DONE ON URINES WITH NEGATIVE PROTEIN, BLOOD, LEUKOCYTES, NITRITE, OR GLUCOSE <1000 mg/dL. Performed at [facility]   Glucose, capillary     Status: Abnormal   Collection Time: 02/08/15 11:27 AM  Result Value Ref Range   Glucose-Capillary 172 (H) 65 - 99 mg/dL   Comment 1 Notify RN    Comment 2 Document in Chart   Glucose, capillary     Status: Abnormal   Collection Time: 02/08/15  4:33 PM  Result Value Ref Range   Glucose-Capillary 141 (H) 65 - 99 mg/dL   Comment 1 Notify RN    Comment 2 Document in Chart   Glucose, capillary     Status: Abnormal   Collection Time: 02/08/15  9:57 PM  Result Value Ref Range   Glucose-Capillary 131 (H) 65 - 99 mg/dL  Glucose, capillary     Status: Abnormal   Collection Time: 02/09/15  6:05 AM  Result Value Ref Range   Glucose-Capillary 123 (H) 65 - 99  mg/dL  Glucose, capillary     Status: Abnormal   Collection Time: 02/09/15 11:58 AM  Result Value Ref Range   Glucose-Capillary 101 (H) 65 - 99 mg/dL   Comment 1 Notify RN  Comment 2 Document in Chart     Physical Findings: AIMS: Facial and Oral Movements Muscles of Facial Expression: None, normal Lips and Perioral Area: None, normal Jaw: None, normal Tongue: None, normal,Extremity Movements Upper (arms, wrists, hands, fingers): None, normal Lower (legs, knees, ankles, toes): None, normal, Trunk Movements Neck, shoulders, hips: None, normal, Overall Severity Severity of abnormal movements (highest score from questions above): None, normal Incapacitation due to abnormal movements: None, normal Patient's awareness of abnormal movements (rate only patient's report): No Awareness, Dental Status Current problems with teeth and/or dentures?: No Does patient usually wear dentures?: No  CIWA:  CIWA-Ar Total: 2 COWS:  COWS Total Score: 4  Musculoskeletal: Strength & Muscle Tone: within normal limits Gait & Station: normal Patient leans: N/A  Psychiatric Specialty Exam: Review of Systems  Psychiatric/Behavioral: Positive for depression. The patient is nervous/anxious.   All other systems reviewed and are negative.   Blood pressure 126/82, pulse 115, temperature 98.3 F (36.8 C), temperature source Oral, resp. rate 16, height 5' 3.5" (1.613 m), weight 90.719 kg (200 lb), last menstrual period 01/25/2015.Body mass index is 34.87 kg/(m^2).  General Appearance: Casual  Eye Contact::  Fair  Speech:  Clear and Coherent  Volume:  Normal  Mood:  Anxious and Depressed  Affect:  Congruent  Thought Process:  Disorganized -more organized today  Orientation:  Full (Time, Place, and Person)  Thought Content:  Delusions, Paranoid Ideation and Rumination improving  Suicidal Thoughts:  No  Homicidal Thoughts:  No  Memory:  Immediate;   Fair Recent;   Fair Remote;   Fair  Judgement:  Fair   Insight:  Shallow  Psychomotor Activity:  Decreased  Concentration:  Poor  Recall:  [employer] of Knowledge:Fair  Language: Fair  Akathisia:  No  Handed:  Right  AIMS (if indicated):     Assets:  Desire for Improvement  ADL's:  Intact  Cognition: WNL  Sleep:  Number of Hours: 6.5   Treatment Plan Summary:[name] [name] is a 35 y.o.AA female , who has a hx of schizoaffective do, who presented after she were noncompliant on her medications. Pt today with improvement of her sx. Will continue treatment.  Daily contact with patient to assess and evaluate symptoms and progress in treatment and Medication management  Will restart Depakote ER 750 mg po qhs for mood sx. Depakote level - 02/12/15. Will continue Abilify 5 mg po qhs for psychosis.Plan to discharge pt on Abilify Maintena IM if she agrees. Will continue  Cogentin 0.5 mg po qhs for EPS. Will continue  PRN medications as per agitation protocol. Will continue to monitor vitals ,medication compliance and treatment side effects while patient is here.  Pt with hx of Diabetes , not on treatment. Hba1c- 6.4 ( 02/01/15) - diabetic consult placed. Started Metformin 500 mg po daily. Started SSI. Will monitor for medical issues as well as call consult as needed.  Reviewed labs tsh( 02/01/15) - wnl, lipid panel ( 02/01/15) - wnl, PL ( 02/01/15) - wnl , cbc - Hb- slightly low , cmp - wnl ,ekg reviewed, UA - wnl, UDS- negative, BAL <5. CSW will start working on disposition.  Patient to participate in therapeutic milieu .   Roopa Graver, MD 02/09/2015, 1:55 PM

## 2015-02-09 NOTE — Progress Notes (Signed)
D: Pt is alert and oriented x 4; Pt denies depression, anxiety, pain, SI, HI and AVH; she states, "I feel much better." Pt is very flat, minimal interaction and withdrawn to room. Pt remained nonviolent through the shift. A: Medications offered as prescribed.  Support, encouragement, and safe environment provided.  15-minute safety checks continue. R: Pt was med compliant.  Pt did not attend group. Safety checks continue.

## 2015-02-09 NOTE — Progress Notes (Signed)
D Hazel has spent most of the dya sitting in her room. Report from previous shift is that this is how she spenads her time on the unit. She did complete her daily assessment and on it she wrote  She denied having SI today and she rate her depression, hopelessness and anxiety " 0/0/0 ", respectively.    A She has voiced no complaints today and is quiet at the time. She makes good eye contact and smiles shyly when she approaches this writer ( to hand in her diaily assessment).   R Safety is in place.

## 2015-02-09 NOTE — Progress Notes (Signed)
Did not attend group 

## 2015-02-10 DIAGNOSIS — F250 Schizoaffective disorder, bipolar type: Principal | ICD-10-CM

## 2015-02-10 LAB — GLUCOSE, CAPILLARY
GLUCOSE-CAPILLARY: 124 mg/dL — AB (ref 65–99)
GLUCOSE-CAPILLARY: 124 mg/dL — AB (ref 65–99)
GLUCOSE-CAPILLARY: 136 mg/dL — AB (ref 65–99)
Glucose-Capillary: 115 mg/dL — ABNORMAL HIGH (ref 65–99)

## 2015-02-10 NOTE — Progress Notes (Signed)
[name] MD Progress Note  02/10/2015 3:03 PM [name] [name]  MRN:  [id number]   Subjective:  Patient states " I am sleeping better.  I like Abilify.  "  Objective:[name] [name] is a 35 y.o.AA female , separated , on SSD , currently lives with a pastor , with whom she moved to [place] from [place] recently, has a hx of schizoaffective do, presented very disorganzied. Patient seen and chart reviewed.  She is taking Abilify and denies any side effects.  She has seen a huge improvement since taking Abilify.  She is less depressed and less anxious.  She reported her paranoia irritability and mood swings are getting better.  She is going to the groups and she is able to participate without any distraction.  Her thought processes is getting better.  She agreed to continue medication upon discharge.  She is not aggressive and her paranoia and delusions are less intense and less frequent.    Principal Problem: Schizoaffective disorder, bipolar type ([place]) Diagnosis:   Patient Active Problem List   Diagnosis Date Noted  . Diabetes mellitus ([place]) [E11.9] 02/08/2015  . Schizoaffective disorder, bipolar type ([place]) [F25.0] 01/28/2015  . Non compliance w medication regimen [Z91.14]    Total Time spent with patient: 30 minutes  Past Psychiatric History: :Pt has a hx of schizoaffective do, bipolar type, had several admissions in [place] as well as [place] [place]. Pt denies past hx of suicide attempts  Past Medical History:  Past Medical History  Diagnosis Date  . Diabetes mellitus without complication ([place])   . Schizophrenia ([place])   . Bipolar affective disorder, currently manic, mild ([place])     Family History: Denies hx of DM, HTN,heart disease, thyroid disease in family Family History  Problem Relation Age of Onset  . Drug abuse Maternal Uncle    Family Psychiatric  History: as described above.  Social History: Pt is separated , lives with a pastor with whom she were having a  relationship with , is on SSD History  Alcohol Use No     History  Drug Use Not on file    Social History   Social History  . Marital Status: Legally Separated    Spouse Name: N/A  . Number of Children: N/A  . Years of Education: N/A   Social History Main Topics  . Smoking status: Never Smoker   . Smokeless tobacco: None  . Alcohol Use: No  . Drug Use: None  . Sexual Activity: Not Asked   Other Topics Concern  . None   Social History Narrative   Additional Social History:    Pain Medications: see PTA  Prescriptions: see PTA  Over the Counter: see PTA  History of alcohol / drug use?: No history of alcohol / drug abuse     Sleep: Fair  Appetite:  Fair  Current Medications: Current Facility-Administered Medications  Medication Dose Route Frequency Provider Last Rate Last Dose  . alum & mag hydroxide-simeth (MAALOX/MYLANTA) 200-200-20 MG/5ML suspension 30 mL  30 mL Oral Q4H PRN Delfin Gant, NP      . ARIPiprazole (ABILIFY) tablet 5 mg  5 mg Oral QHS Ursula Alert, MD   5 mg at 02/09/15 2107  . benztropine (COGENTIN) tablet 0.5 mg  0.5 mg Oral QHS Ursula Alert, MD   0.5 mg at 02/09/15 2107  . divalproex (DEPAKOTE ER) 24 hr tablet 750 mg  750 mg Oral QHS Ursula Alert, MD   750 mg at 02/09/15 2107  .  docusate sodium (COLACE) capsule 100 mg  100 mg Oral BID Kerrie Buffalo, NP   100 mg at 02/10/15 [id number]  . hydrOXYzine (ATARAX/VISTARIL) tablet 25 mg  25 mg Oral Q6H PRN Ursula Alert, MD      . ibuprofen (ADVIL,MOTRIN) tablet 600 mg  600 mg Oral QID Ursula Alert, MD   600 mg at 02/10/15 0735  . insulin aspart (novoLOG) injection 0-15 Units  0-15 Units Subcutaneous TID WC Ursula Alert, MD   3 Units at 02/08/15 1235  . LORazepam (ATIVAN) tablet 1 mg  1 mg Oral Q8H PRN Delfin Gant, NP   1 mg at 02/07/15 1952  . magnesium hydroxide (MILK OF MAGNESIA) suspension 30 mL  30 mL Oral Daily PRN Delfin Gant, NP      . metFORMIN (GLUCOPHAGE) tablet 500 mg   500 mg Oral Q breakfast Saramma Eappen, MD   500 mg at 02/10/15 0735  . nicotine (NICODERM CQ - dosed in mg/24 hours) patch 21 mg  21 mg Transdermal Daily Patrecia Pour, NP   21 mg at 02/07/15 1716  . OLANZapine zydis (ZYPREXA) disintegrating tablet 5 mg  5 mg Oral Q6H PRN Ursula Alert, MD      . ondansetron (ZOFRAN) tablet 4 mg  4 mg Oral Q8H PRN Patrecia Pour, NP      . zolpidem (AMBIEN) tablet 5 mg  5 mg Oral QHS Ursula Alert, MD   5 mg at 02/09/15 2107    Lab Results:  Results for orders placed or performed during the hospital encounter of 02/07/15 (from the past 48 hour(s))  Glucose, capillary     Status: Abnormal   Collection Time: 02/08/15  4:33 PM  Result Value Ref Range   Glucose-Capillary 141 (H) 65 - 99 mg/dL   Comment 1 Notify RN    Comment 2 Document in Chart   Glucose, capillary     Status: Abnormal   Collection Time: 02/08/15  9:57 PM  Result Value Ref Range   Glucose-Capillary 131 (H) 65 - 99 mg/dL  Glucose, capillary     Status: Abnormal   Collection Time: 02/09/15  6:05 AM  Result Value Ref Range   Glucose-Capillary 123 (H) 65 - 99 mg/dL  Glucose, capillary     Status: Abnormal   Collection Time: 02/09/15 11:58 AM  Result Value Ref Range   Glucose-Capillary 101 (H) 65 - 99 mg/dL   Comment 1 Notify RN    Comment 2 Document in Chart   Glucose, capillary     Status: Abnormal   Collection Time: 02/09/15  5:12 PM  Result Value Ref Range   Glucose-Capillary 174 (H) 65 - 99 mg/dL   Comment 1 Notify RN   Glucose, capillary     Status: Abnormal   Collection Time: 02/09/15  9:30 PM  Result Value Ref Range   Glucose-Capillary 102 (H) 65 - 99 mg/dL  Glucose, capillary     Status: Abnormal   Collection Time: 02/10/15  5:49 AM  Result Value Ref Range   Glucose-Capillary 124 (H) 65 - 99 mg/dL    Physical Findings: AIMS: Facial and Oral Movements Muscles of Facial Expression: None, normal Lips and Perioral Area: None, normal Jaw: None, normal Tongue: None,  normal,Extremity Movements Upper (arms, wrists, hands, fingers): None, normal Lower (legs, knees, ankles, toes): None, normal, Trunk Movements Neck, shoulders, hips: None, normal, Overall Severity Severity of abnormal movements (highest score from questions above): None, normal Incapacitation due to abnormal movements: None, normal  Patient's awareness of abnormal movements (rate only patient's report): No Awareness, Dental Status Current problems with teeth and/or dentures?: No Does patient usually wear dentures?: No  CIWA:  CIWA-Ar Total: 2 COWS:  COWS Total Score: 4  Musculoskeletal: Strength & Muscle Tone: within normal limits Gait & Station: normal Patient leans: N/A  Psychiatric Specialty Exam: Review of Systems  Psychiatric/Behavioral: Positive for depression. The patient is nervous/anxious.   All other systems reviewed and are negative.   Blood pressure 110/65, pulse 62, temperature 98.2 F (36.8 C), temperature source Oral, resp. rate 16, height 5' 3.5" (1.613 m), weight 90.719 kg (200 lb), last menstrual period 01/25/2015.Body mass index is 34.87 kg/(m^2).  General Appearance: Casual  Eye Contact::  Fair  Speech:  Clear and Coherent  Volume:  Normal  Mood:  Anxious  Affect:  Congruent  Thought Process:  Circumstantial  Orientation:  Full (Time, Place, and Person)  Thought Content:  Delusions and Paranoid Ideation improving  Suicidal Thoughts:  No  Homicidal Thoughts:  No  Memory:  Immediate;   Fair Recent;   Fair Remote;   Fair  Judgement:  Fair  Insight:  Shallow  Psychomotor Activity:  Decreased  Concentration:  Poor  Recall:  [employer] of Knowledge:Fair  Language: Fair  Akathisia:  No  Handed:  Right  AIMS (if indicated):     Assets:  Desire for Improvement  ADL's:  Intact  Cognition: WNL  Sleep:  Number of Hours: 5.75   Treatment Plan Summary:[name] [name] is a 35 y.o.AA female , who has a hx of schizoaffective do, who presented after she were  noncompliant on her medications. Pt today with improvement of her sx. Will continue treatment.  Daily contact with patient to assess and evaluate symptoms and progress in treatment and Medication management  Continue Depakote ER 750 mg po qhs for mood sx. Depakote level - 02/12/15. Will continue Abilify 5 mg po qhs for psychosis.Plan to discharge pt on Abilify Maintena IM if she agrees. Will continue  Cogentin 0.5 mg po qhs for EPS. Will continue  PRN medications as per agitation protocol. Will continue to monitor vitals ,medication compliance and treatment side effects while patient is here.  Pt with hx of Diabetes , not on treatment. Hba1c- 6.4 ( 02/01/15) - diabetic consult placed. Started Metformin 500 mg po daily. Started SSI. Will monitor for medical issues as well as call consult as needed.  Reviewed labs tsh( 02/01/15) - wnl, lipid panel ( 02/01/15) - wnl, PL ( 02/01/15) - wnl , cbc - Hb- slightly low , cmp - wnl ,ekg reviewed, UA - wnl, UDS- negative, BAL <5. CSW will start working on disposition.  Patient to participate in therapeutic milieu .   Ezme Duch T., MD 02/10/2015, 3:03 PM

## 2015-02-10 NOTE — BHH Group Notes (Signed)
[place] Group Notes:  (Clinical Social Work)  02/10/2015  11:15-12:00PM  Summary of Progress/Problems:   Today's process group involved patients discussing their feelings related to being hospitalized, as well as how they can use their present feelings to make a plan for how to stay out of the hospital.  There was an emphasis on setting boundaries, making "I" statements, going to AA/support groups/therapy/doctor's appointments. The patient was late to group and never talked, stared without blinking at CSW most of the time she was in group, did not respond to any comments, invitations to take part, smiles.  Type of Therapy:  Group Therapy - Process  Participation Level:  Minimal  Participation Quality:  Resistant  Affect:  Defensive, Depressed and Flat  Cognitive:  Lacking  Insight:  Poor  Engagement in Therapy:  Resistant  Modes of Intervention:  Exploration, Discussion  Selmer Dominion, LCSW 02/10/2015, 12:50 PM

## 2015-02-11 LAB — URINE CULTURE

## 2015-02-11 LAB — GLUCOSE, CAPILLARY
GLUCOSE-CAPILLARY: 104 mg/dL — AB (ref 65–99)
GLUCOSE-CAPILLARY: 133 mg/dL — AB (ref 65–99)
Glucose-Capillary: 116 mg/dL — ABNORMAL HIGH (ref 65–99)
Glucose-Capillary: 113 mg/dL — ABNORMAL HIGH (ref 65–99)

## 2015-02-11 MED ORDER — GUAIFENESIN ER 600 MG PO TB12
600.0000 mg | ORAL_TABLET | Freq: Two times a day (BID) | ORAL | Status: DC
  Administered 2015-02-11 – 2015-02-14 (×6): 600 mg via ORAL
  Filled 2015-02-11 (×9): qty 1

## 2015-02-11 NOTE — Plan of Care (Signed)
Problem: Alteration in mood & ability to function due to Goal: STG-Patient will comply with prescribed medication regimen (Patient will comply with prescribed medication regimen)  Outcome: Progressing Client is compliant with medication regime AEB taking medications as scheduled, without incidence.
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
## 2015-02-11 NOTE — BHH Group Notes (Signed)
[place] Group Notes:  (Clinical Social Work)  02/11/2015  [place] Group Notes:  (Clinical Social Work)  02/11/2015  11:00AM-12:00PM  Summary of Progress/Problems:  The main focus of today's process group was to listen to a variety of genres of music and to identify that different types of music provoke different responses.  The patient then was able to identify personally what was soothing for them, as well as energizing.   The patient expressed understanding of concepts, as well as knowledge of how each type of music affected her and how this can be used at home as a wellness/recovery tool.  She stated she felt better at the end of group, but was tearful at the last song.  Type of Therapy:  Music Therapy   Participation Level:  Active  Participation Quality:  Attentive and Sharing  Affect:  Blunted  Cognitive:  Oriented  Insight:  Engaged  Engagement in Therapy:  Engaged  Modes of Intervention:   Activity, Exploration  Selmer Dominion, LCSW 02/11/2015

## 2015-02-11 NOTE — Progress Notes (Signed)
DAR NOTE: Patient is calm and pleasant.  Denies auditory and visual hallucinations.  Rates depression at 0, hopelessness at 0, and anxiety at 0.  Describes energy level as normal and concentration as good.  Maintained on routine safety checks.  Medications given as prescribed.  Support and encouragement offered as needed.  Attended group and participated.  States goal for today is "fellowship with roommate."  Patient observed socializing with peers in the dayroom.

## 2015-02-11 NOTE — Progress Notes (Signed)
[facility] MD Progress Note  02/11/2015 12:56 PM [name] [name]  MRN:  [id number]   Subjective:  Patient states " I talk to my husband.  Things are not going well.  I'm very anxious and nervous.  "  Objective:[name] [name] is a 35 y.o.AA female , separated , on SSD , currently lives with a pastor , with whom she moved to [place] from [place] recently, has a hx of schizoaffective do, presented very disorganzied. Patient seen and chart reviewed.  Patient appeared anxious and nervous.  She is talking to her husband who lives in [place] but she was disappointed that things are not going well .  However she is taking Abilify and reported no side effects.  Her sleep is improved.  She still have paranoia and hallucination but it is less intense from the past.  She is going to the groups.  Sometimes he does not participates in groups.  She is not disruptive.  She has no tremors or shakes.  She like her medication and believes her depression is getting better.  Principal Problem: Schizoaffective disorder, bipolar type ([place]) Diagnosis:   Patient Active Problem List   Diagnosis Date Noted  . Diabetes mellitus ([place]) [E11.9] 02/08/2015  . Schizoaffective disorder, bipolar type ([place]) [F25.0] 01/28/2015  . Non compliance w medication regimen [Z91.14]    Total Time spent with patient: 20 minutes  Past Psychiatric History: :Pt has a hx of schizoaffective do, bipolar type, had several admissions in [place] as well as [place] [place]. Pt denies past hx of suicide attempts  Past Medical History:  Past Medical History  Diagnosis Date  . Diabetes mellitus without complication ([place])   . Schizophrenia ([place])   . Bipolar affective disorder, currently manic, mild ([place])     Family History: Denies hx of DM, HTN,heart disease, thyroid disease in family Family History  Problem Relation Age of Onset  . Drug abuse Maternal Uncle    Family Psychiatric  History: as described above.  Social History: Pt  is separated , lives with a pastor with whom she were having a relationship with , is on SSD History  Alcohol Use No     History  Drug Use Not on file    Social History   Social History  . Marital Status: Legally Separated    Spouse Name: N/A  . Number of Children: N/A  . Years of Education: N/A   Social History Main Topics  . Smoking status: Never Smoker   . Smokeless tobacco: None  . Alcohol Use: No  . Drug Use: None  . Sexual Activity: Not Asked   Other Topics Concern  . None   Social History Narrative   Additional Social History:    Pain Medications: see PTA  Prescriptions: see PTA  Over the Counter: see PTA  History of alcohol / drug use?: No history of alcohol / drug abuse     Sleep: Fair  Appetite:  Fair  Current Medications: Current Facility-Administered Medications  Medication Dose Route Frequency Provider Last Rate Last Dose  . alum & mag hydroxide-simeth (MAALOX/MYLANTA) 200-200-20 MG/5ML suspension 30 mL  30 mL Oral Q4H PRN Delfin Gant, NP      . ARIPiprazole (ABILIFY) tablet 5 mg  5 mg Oral QHS Ursula Alert, MD   5 mg at 02/10/15 2124  . benztropine (COGENTIN) tablet 0.5 mg  0.5 mg Oral QHS Ursula Alert, MD   0.5 mg at 02/10/15 2124  . divalproex (DEPAKOTE ER) 24 hr  tablet 750 mg  750 mg Oral QHS Ursula Alert, MD   750 mg at 02/10/15 2124  . docusate sodium (COLACE) capsule 100 mg  100 mg Oral BID Kerrie Buffalo, NP   100 mg at 02/11/15 0808  . hydrOXYzine (ATARAX/VISTARIL) tablet 25 mg  25 mg Oral Q6H PRN Ursula Alert, MD      . ibuprofen (ADVIL,MOTRIN) tablet 600 mg  600 mg Oral QID Ursula Alert, MD   600 mg at 02/11/15 0808  . insulin aspart (novoLOG) injection 0-15 Units  0-15 Units Subcutaneous TID WC Ursula Alert, MD   3 Units at 02/08/15 1235  . LORazepam (ATIVAN) tablet 1 mg  1 mg Oral Q8H PRN Delfin Gant, NP   1 mg at 02/07/15 1952  . magnesium hydroxide (MILK OF MAGNESIA) suspension 30 mL  30 mL Oral Daily PRN  Delfin Gant, NP      . metFORMIN (GLUCOPHAGE) tablet 500 mg  500 mg Oral Q breakfast Saramma Eappen, MD   500 mg at 02/11/15 1010  . nicotine (NICODERM CQ - dosed in mg/24 hours) patch 21 mg  21 mg Transdermal Daily Patrecia Pour, NP   21 mg at 02/11/15 0800  . OLANZapine zydis (ZYPREXA) disintegrating tablet 5 mg  5 mg Oral Q6H PRN Ursula Alert, MD      . ondansetron (ZOFRAN) tablet 4 mg  4 mg Oral Q8H PRN Patrecia Pour, NP      . zolpidem (AMBIEN) tablet 5 mg  5 mg Oral QHS Ursula Alert, MD   5 mg at 02/10/15 2124    Lab Results:  Results for orders placed or performed during the hospital encounter of 02/07/15 (from the past 48 hour(s))  Glucose, capillary     Status: Abnormal   Collection Time: 02/09/15  5:12 PM  Result Value Ref Range   Glucose-Capillary 174 (H) 65 - 99 mg/dL   Comment 1 Notify RN   Glucose, capillary     Status: Abnormal   Collection Time: 02/09/15  9:30 PM  Result Value Ref Range   Glucose-Capillary 102 (H) 65 - 99 mg/dL  Glucose, capillary     Status: Abnormal   Collection Time: 02/10/15  5:49 AM  Result Value Ref Range   Glucose-Capillary 124 (H) 65 - 99 mg/dL  Glucose, capillary     Status: Abnormal   Collection Time: 02/10/15 12:23 PM  Result Value Ref Range   Glucose-Capillary 124 (H) 65 - 99 mg/dL  Glucose, capillary     Status: Abnormal   Collection Time: 02/10/15  5:20 PM  Result Value Ref Range   Glucose-Capillary 136 (H) 65 - 99 mg/dL  Glucose, capillary     Status: Abnormal   Collection Time: 02/10/15  8:36 PM  Result Value Ref Range   Glucose-Capillary 115 (H) 65 - 99 mg/dL  Glucose, capillary     Status: Abnormal   Collection Time: 02/11/15  5:47 AM  Result Value Ref Range   Glucose-Capillary 116 (H) 65 - 99 mg/dL    Physical Findings: AIMS: Facial and Oral Movements Muscles of Facial Expression: None, normal Lips and Perioral Area: None, normal Jaw: None, normal Tongue: None, normal,Extremity Movements Upper (arms,  wrists, hands, fingers): None, normal Lower (legs, knees, ankles, toes): None, normal, Trunk Movements Neck, shoulders, hips: None, normal, Overall Severity Severity of abnormal movements (highest score from questions above): None, normal Incapacitation due to abnormal movements: None, normal Patient's awareness of abnormal movements (rate only patient's report): No  Awareness, Dental Status Current problems with teeth and/or dentures?: No Does patient usually wear dentures?: No  CIWA:  CIWA-Ar Total: 2 COWS:  COWS Total Score: 4  Musculoskeletal: Strength & Muscle Tone: within normal limits Gait & Station: normal Patient leans: N/A  Psychiatric Specialty Exam: Review of Systems  Psychiatric/Behavioral: Positive for depression. The patient is nervous/anxious.   All other systems reviewed and are negative.   Blood pressure 118/81, pulse 96, temperature 98 F (36.7 C), temperature source Oral, resp. rate 16, height 5' 3.5" (1.613 m), weight 90.719 kg (200 lb), last menstrual period 01/25/2015.Body mass index is 34.87 kg/(m^2).  General Appearance: Casual  Eye Contact::  Fair  Speech:  Clear and Coherent  Volume:  Normal  Mood:  Anxious  Affect:  Congruent  Thought Process:  Circumstantial  Orientation:  Full (Time, Place, and Person)  Thought Content:  Paranoid Ideation and Rumination improving  Suicidal Thoughts:  No  Homicidal Thoughts:  No  Memory:  Immediate;   Fair Recent;   Fair Remote;   Fair  Judgement:  Fair  Insight:  Shallow  Psychomotor Activity:  Decreased  Concentration:  Fair  Recall:  [employer] of Knowledge:Fair  Language: Fair  Akathisia:  No  Handed:  Right  AIMS (if indicated):     Assets:  Desire for Improvement  ADL's:  Intact  Cognition: WNL  Sleep:  Number of Hours: 4.75   Treatment Plan Summary:Malone Topping is a 35 y.o.AA female , who has a hx of schizoaffective do, who presented after she were noncompliant on her medications. Pt today  with improvement of her sx. Will continue treatment.  Daily contact with patient to assess and evaluate symptoms and progress in treatment and Medication management  Continue Depakote ER 750 mg po qhs for mood sx. Depakote level - 02/12/15. Will continue Abilify 5 mg po qhs for psychosis.Plan to discharge pt on Abilify Maintena IM if she agrees. Will continue  Cogentin 0.5 mg po qhs for EPS. Will continue  PRN medications as per agitation protocol. Will continue to monitor vitals ,medication compliance and treatment side effects while patient is here.  Pt with hx of Diabetes , Continue Metformin 500 mg po daily. Will monitor for medical issues as well as call consult as needed.  Reviewed labs tsh( 02/01/15) - wnl, lipid panel ( 02/01/15) - wnl, PL ( 02/01/15) - wnl , cbc - Hb- slightly low , cmp - wnl ,ekg reviewed, UA - wnl, UDS- negative, BAL <5. CSW will start working on disposition.  Patient to participate in therapeutic milieu .   Cote Mayabb [name]., MD 02/11/2015, 12:56 PM

## 2015-02-11 NOTE — Progress Notes (Signed)
Psychoeducational Group Note  Date:  02/11/2015 Time:  0156  Group Topic/Focus:  Wrap-Up Group:   The focus of this group is to help patients review their daily goal of treatment and discuss progress on daily workbooks.  Participation Level: Did Not Attend  Participation Quality:  Not Applicable  Affect:  Not Applicable  Cognitive:  Not Applicable  Insight:  Not Applicable  Engagement in Group: Not Applicable  Additional Comments:  The patient did not attend group.   Emberlin Verner S 02/11/2015, 1:56 AM

## 2015-02-12 ENCOUNTER — Telehealth: Payer: Self-pay

## 2015-02-12 LAB — GLUCOSE, CAPILLARY
GLUCOSE-CAPILLARY: 108 mg/dL — AB (ref 65–99)
GLUCOSE-CAPILLARY: 106 mg/dL — AB (ref 65–99)
Glucose-Capillary: 129 mg/dL — ABNORMAL HIGH (ref 65–99)
Glucose-Capillary: 149 mg/dL — ABNORMAL HIGH (ref 65–99)

## 2015-02-12 LAB — VALPROIC ACID LEVEL: Valproic Acid Lvl: 86 ug/mL (ref 50.0–100.0)

## 2015-02-12 MED ORDER — IBUPROFEN 600 MG PO TABS
600.0000 mg | ORAL_TABLET | Freq: Four times a day (QID) | ORAL | Status: DC | PRN
  Administered 2015-02-12 – 2015-02-13 (×2): 600 mg via ORAL
  Filled 2015-02-12: qty 1

## 2015-02-12 MED ORDER — BACITRACIN-NEOMYCIN-POLYMYXIN OINTMENT TUBE
TOPICAL_OINTMENT | Freq: Two times a day (BID) | CUTANEOUS | Status: DC
  Administered 2015-02-12 – 2015-02-14 (×4): via TOPICAL
  Filled 2015-02-12: qty 15

## 2015-02-12 MED ORDER — ADULT MULTIVITAMIN W/MINERALS CH
1.0000 | ORAL_TABLET | Freq: Every day | ORAL | Status: DC
  Administered 2015-02-12 – 2015-02-14 (×3): 1 via ORAL
  Filled 2015-02-12 (×4): qty 1

## 2015-02-12 MED ORDER — FESOTERODINE FUMARATE ER 4 MG PO TB24
4.0000 mg | ORAL_TABLET | Freq: Every day | ORAL | Status: DC
  Administered 2015-02-12 – 2015-02-14 (×3): 4 mg via ORAL
  Filled 2015-02-12 (×4): qty 1

## 2015-02-12 NOTE — Progress Notes (Signed)
[facility] MD Progress Note  02/12/2015 3:39 PM [name] [name]  MRN:  [id number]   Subjective:  Patient states " I am better. I think my medications are working.'   Objective:[name] [name] is a 35 y.o.AA female , separated , on SSD , currently lives with a pastor , with whom she moved to [place] from [place] recently, has a hx of schizoaffective do, presented very disorganzied. Patient seen and chart reviewed.Case discussed with treatment team. Pt today seen as linear , less anxious ,denies any paranoia/SI/AH. Pt is compliant on medications. Denies ADRs. Pt continues to be anxious about her overactive bladder. She was on detrol LA in the past. She is not sure about her dosage.Pt with diabetes - likely causing her bladder problems.   Principal Problem: Schizoaffective disorder, bipolar type ([place]) Diagnosis:   Patient Active Problem List   Diagnosis Date Noted  . Diabetes mellitus ([place]) [E11.9] 02/08/2015  . Schizoaffective disorder, bipolar type ([place]) [F25.0] 01/28/2015  . Non compliance w medication regimen [Z91.14]    Total Time spent with patient: 25 minutes  Past Psychiatric History: :Pt has a hx of schizoaffective do, bipolar type, had several admissions in [place] as well as [place]. Pt denies past hx of suicide attempts  Past Medical History:  Past Medical History  Diagnosis Date  . Diabetes mellitus without complication ([place])   . Schizophrenia ([place])   . Bipolar affective disorder, currently manic, mild ([place])     Family History: Denies hx of DM, HTN,heart disease, thyroid disease in family Family History  Problem Relation Age of Onset  . Drug abuse Maternal Uncle    Family Psychiatric  History: as described above.  Social History: Pt is separated , lives with a pastor with whom she were having a relationship with , is on SSD History  Alcohol Use No     History  Drug Use Not on file    Social History   Social History  . Marital Status: Legally  Separated    Spouse Name: N/A  . Number of Children: N/A  . Years of Education: N/A   Social History Main Topics  . Smoking status: Never Smoker   . Smokeless tobacco: None  . Alcohol Use: No  . Drug Use: None  . Sexual Activity: Not Asked   Other Topics Concern  . None   Social History Narrative   Additional Social History:    Pain Medications: see PTA  Prescriptions: see PTA  Over the Counter: see PTA  History of alcohol / drug use?: No history of alcohol / drug abuse     Sleep: Fair  Appetite:  Fair  Current Medications: Current Facility-Administered Medications  Medication Dose Route Frequency Provider Last Rate Last Dose  . alum & mag hydroxide-simeth (MAALOX/MYLANTA) 200-200-20 MG/5ML suspension 30 mL  30 mL Oral Q4H PRN Delfin Gant, NP   30 mL at 02/11/15 2111  . ARIPiprazole (ABILIFY) tablet 5 mg  5 mg Oral QHS Ursula Alert, MD   5 mg at 02/11/15 2113  . benztropine (COGENTIN) tablet 0.5 mg  0.5 mg Oral QHS Ursula Alert, MD   0.5 mg at 02/11/15 2109  . divalproex (DEPAKOTE ER) 24 hr tablet 750 mg  750 mg Oral QHS Ursula Alert, MD   750 mg at 02/11/15 2109  . docusate sodium (COLACE) capsule 100 mg  100 mg Oral BID Kerrie Buffalo, NP   100 mg at 02/12/15 0827  . fesoterodine (TOVIAZ) tablet 4 mg  4  mg Oral Daily Ursula Alert, MD   4 mg at 02/12/15 1200  . guaiFENesin (MUCINEX) 12 hr tablet 600 mg  600 mg Oral BID Kathlee Nations, MD   600 mg at 02/12/15 [id number]  . hydrOXYzine (ATARAX/VISTARIL) tablet 25 mg  25 mg Oral Q6H PRN Ursula Alert, MD      . ibuprofen (ADVIL,MOTRIN) tablet 600 mg  600 mg Oral Q6H PRN Janari Yamada, MD      . insulin aspart (novoLOG) injection 0-15 Units  0-15 Units Subcutaneous TID WC Ursula Alert, MD   3 Units at 02/08/15 1235  . LORazepam (ATIVAN) tablet 1 mg  1 mg Oral Q8H PRN Delfin Gant, NP   1 mg at 02/07/15 1952  . magnesium hydroxide (MILK OF MAGNESIA) suspension 30 mL  30 mL Oral Daily PRN Delfin Gant, NP       . metFORMIN (GLUCOPHAGE) tablet 500 mg  500 mg Oral Q breakfast Ursula Alert, MD   500 mg at 02/12/15 0827  . multivitamin with minerals tablet 1 tablet  1 tablet Oral Daily Ursula Alert, MD   1 tablet at 02/12/15 1040  . neomycin-bacitracin-polymyxin (NEOSPORIN) ointment   Topical BID Zamari Vea, MD      . nicotine (NICODERM CQ - dosed in mg/24 hours) patch 21 mg  21 mg Transdermal Daily Patrecia Pour, NP   21 mg at 02/11/15 0800  . OLANZapine zydis (ZYPREXA) disintegrating tablet 5 mg  5 mg Oral Q6H PRN Ursula Alert, MD      . ondansetron (ZOFRAN) tablet 4 mg  4 mg Oral Q8H PRN Patrecia Pour, NP      . zolpidem (AMBIEN) tablet 5 mg  5 mg Oral QHS Ursula Alert, MD   5 mg at 02/11/15 2109    Lab Results:  Results for orders placed or performed during the hospital encounter of 02/07/15 (from the past 48 hour(s))  Glucose, capillary     Status: Abnormal   Collection Time: 02/10/15  5:20 PM  Result Value Ref Range   Glucose-Capillary 136 (H) 65 - 99 mg/dL  Glucose, capillary     Status: Abnormal   Collection Time: 02/10/15  8:36 PM  Result Value Ref Range   Glucose-Capillary 115 (H) 65 - 99 mg/dL  Glucose, capillary     Status: Abnormal   Collection Time: 02/11/15  5:47 AM  Result Value Ref Range   Glucose-Capillary 116 (H) 65 - 99 mg/dL  Glucose, capillary     Status: Abnormal   Collection Time: 02/11/15 12:01 PM  Result Value Ref Range   Glucose-Capillary 113 (H) 65 - 99 mg/dL  Glucose, capillary     Status: Abnormal   Collection Time: 02/11/15  4:31 PM  Result Value Ref Range   Glucose-Capillary 104 (H) 65 - 99 mg/dL  Glucose, capillary     Status: Abnormal   Collection Time: 02/11/15  8:40 PM  Result Value Ref Range   Glucose-Capillary 133 (H) 65 - 99 mg/dL  Glucose, capillary     Status: Abnormal   Collection Time: 02/12/15  5:41 AM  Result Value Ref Range   Glucose-Capillary 129 (H) 65 - 99 mg/dL  Valproic acid level     Status: None   Collection Time:  02/12/15  6:54 AM  Result Value Ref Range   Valproic Acid Lvl 86 50.0 - 100.0 ug/mL    Comment: Performed at [facility]  Glucose, capillary     Status: Abnormal  Collection Time: 02/12/15 11:37 AM  Result Value Ref Range   Glucose-Capillary 108 (H) 65 - 99 mg/dL    Physical Findings: AIMS: Facial and Oral Movements Muscles of Facial Expression: None, normal Lips and Perioral Area: None, normal Jaw: None, normal Tongue: None, normal,Extremity Movements Upper (arms, wrists, hands, fingers): None, normal Lower (legs, knees, ankles, toes): None, normal, Trunk Movements Neck, shoulders, hips: None, normal, Overall Severity Severity of abnormal movements (highest score from questions above): None, normal Incapacitation due to abnormal movements: None, normal Patient's awareness of abnormal movements (rate only patient's report): No Awareness, Dental Status Current problems with teeth and/or dentures?: No Does patient usually wear dentures?: No  CIWA:  CIWA-Ar Total: 2 COWS:  COWS Total Score: 4  Musculoskeletal: Strength & Muscle Tone: within normal limits Gait & Station: normal Patient leans: N/A  Psychiatric Specialty Exam: Review of Systems  Psychiatric/Behavioral: Positive for depression. The patient is nervous/anxious.   All other systems reviewed and are negative.   Blood pressure 116/84, pulse 96, temperature 98.4 F (36.9 C), temperature source Oral, resp. rate 16, height 5' 3.5" (1.613 m), weight 90.719 kg (200 lb), last menstrual period 01/25/2015.Body mass index is 34.87 kg/(m^2).  General Appearance: Casual  Eye Contact::  Fair  Speech:  Clear and Coherent  Volume:  Normal  Mood:  Anxious  Affect:  Congruent  Thought Process:  Circumstantial  Orientation:  Full (Time, Place, and Person)  Thought Content:  Paranoid Ideation and Rumination improving  Suicidal Thoughts:  No  Homicidal Thoughts:  No  Memory:  Immediate;   Fair Recent;    Fair Remote;   Fair  Judgement:  Fair  Insight:  Shallow  Psychomotor Activity:  Decreased  Concentration:  Fair  Recall:  [employer] of Knowledge:Fair  Language: Fair  Akathisia:  No  Handed:  Right  AIMS (if indicated):     Assets:  Desire for Improvement  ADL's:  Intact  Cognition: WNL  Sleep:  Number of Hours: 2.5   Treatment Plan Summary:[name] [name] is a 36 y.o.AA female , who has a hx of schizoaffective do, who presented after she were noncompliant on her medications. Pt today with improvement of her sx. Will continue treatment.  Daily contact with patient to assess and evaluate symptoms and progress in treatment and Medication management  Continue Depakote ER 750 mg po qhs for mood sx. Depakote level - 02/12/15- 86 ug/ml Will continue Abilify 5 mg po qhs for psychosis.Discussed Abilify Maintena IM -pt declines. Will continue  Cogentin 0.5 mg po qhs for EPS. Will continue  PRN medications as per agitation protocol. Will continue to monitor vitals ,medication compliance and treatment side effects while patient is here.  Pt with hx of Diabetes , Continue Metformin 500 mg po daily. Add Toviaz 4 mg po daily for overactive bladder. Will monitor for medical issues as well as call consult as needed.  Reviewed labs tsh( 02/01/15) - wnl, lipid panel ( 02/01/15) - wnl, PL ( 02/01/15) - wnl , cbc - Hb- slightly low , cmp - wnl ,ekg reviewed, UA - wnl, UDS- negative, BAL <5. CSW will start working on disposition.  Patient to participate in therapeutic milieu .   Tc Kapusta, MD 02/12/2015, 3:39 PM

## 2015-02-12 NOTE — BHH Group Notes (Deleted)
[facility] LCSW Aftercare Discharge Planning Group Note   02/12/2015 12:54 PM  Participation Quality:  Invited, did not attend.  Beverely Pace

## 2015-02-12 NOTE — Telephone Encounter (Signed)
TC from client, she is inpatient on behavioral health unit.  Reports she has been doing better and plans are to be discharged on Tuesday.  She is on medication that is helping.  She plans to go to her friend's home to live.  She is going to be helping him with his care as he is just getting out of rehab on Tuesday as well.  Requested someone to help her get home.  Made phone call to church member per her permission.  They will pick her up or get someone to pick her up at the hospital.  Will f/u prn

## 2015-02-12 NOTE — Progress Notes (Signed)
Patient ID: [name] [name], female   DOB: October 13, 1980, 35 y.o.   MRN: [id number] PER STATE REGULATIONS 482.30  THIS CHART WAS REVIEWED FOR MEDICAL NECESSITY WITH RESPECT TO THE PATIENT'S ADMISSION/ DURATION OF STAY.  NEXT REVIEW DATE:   02/15/2015  Chauncy Lean, RN, BSN CASE MANAGER

## 2015-02-12 NOTE — Progress Notes (Signed)
Adult Psychoeducational Group Note  Date:  02/12/2015 Time:  9:25 PM  Group Topic/Focus:  Wrap-Up Group:   The focus of this group is to help patients review their daily goal of treatment and discuss progress on daily workbooks.  Participation Level:  Active  Participation Quality:  Appropriate  Affect:  Appropriate  Cognitive:  Appropriate  Insight: Appropriate  Engagement in Group:  Engaged  Modes of Intervention:  Discussion  Additional Comments: The patient said that she attended group.The patient also said that she had a good day.  Nash Shearer 02/12/2015, 9:25 PM

## 2015-02-12 NOTE — BHH Group Notes (Signed)
[facility] LCSW Group Therapy  02/12/2015 5:43 PM  Type of Therapy:  Group Therapy  Participation Level:  Active  Participation Quality:  Attentive  Affect:  Excited  Cognitive:  Appropriate  Insight:  Improving  Engagement in Therapy:  Engaged  Modes of Intervention:  Discussion, Exploration and Problem-solving  Summary of Progress/Problems: Today's Topic: Overcoming Obstacles. Patients identified one short term goal and potential obstacles in reaching this goal. Patients processed barriers involved in overcoming these obstacles. Patients identified steps necessary for overcoming these obstacles and explored motivation (internal and external) for facing these difficulties head on. Today's Topic: Overcoming Obstacles. Patients identified one short term goal and potential obstacles in reaching this goal. Patients processed barriers involved in overcoming these obstacles. Patients identified steps necessary for overcoming these obstacles and explored motivation (internal and external) for facing these difficulties head on. Patient discussed side effects of medications as being an obstacle to compliance, described various negative sx she experienced from variety of medications.  Identified trusting collaborative relationship w provider as help to ocercoming this obstacle.     [name] [name] 02/12/2015, 5:43 PM
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
## 2015-02-12 NOTE — Plan of Care (Signed)
Problem: Diagnosis: Increased Risk For Suicide Attempt Goal: STG-Patient Will Attend All Groups On The Unit Outcome: Progressing Pt attended scheduled groups this shift and was engaged.  Goal: STG-Patient Will Comply With Medication Regime Outcome: Progressing Pt compliant with medications as ordered when offered. Denies adverse drug reactions when assessed.

## 2015-02-12 NOTE — Progress Notes (Addendum)
D: Pt A & O to self and place. Visible in milieu at intervals during shift. Attended scheduled groups and was engaged. Denied SI, HI, AVH and pain when assessed this AM. However, pt reported this evening that she "hears the voice of the Reita Cliche" which has guided her for years. Pt is tangential in speech at times during conversation but is calm and cooperative with unit routines. Pt observed crying at med window stated that "The Lord told her not to go to back to [place]" which means that she will not see some people she needs needs to see. A: Medication education done prior to administration to promote compliance. Verbal encouragement, support and availability offered to pt thus far this shift. Q 15 minutes checks done on and off unit for safety as ordered without outburst self harm gestures to report at this time.  R: Pt receptive to care. Denies adverse drug reactions when assessed. Plan of care continues.

## 2015-02-12 NOTE — BHH Group Notes (Signed)
[facility] LCSW Aftercare Discharge Planning Group Note   02/12/2015 1:15 PM  Participation Quality:  In/out of group, attentive when in group  Mood/Affect:  Appropriate  Depression Rating:  0  Anxiety Rating:  0  Thoughts of Suicide:  No Will you contract for safety?   NA  Current AVH:  No  Plan for Discharge/Comments:  Plans to return to [facility] and reconnect w prior providers, has friend who will let her sleep on couch.  Transportation Means: Has money for bus pass  Supports:  Friend in [place], Lynnae January, Nelda Bucks

## 2015-02-13 LAB — GLUCOSE, CAPILLARY
GLUCOSE-CAPILLARY: 139 mg/dL — AB (ref 65–99)
GLUCOSE-CAPILLARY: 103 mg/dL — AB (ref 65–99)
GLUCOSE-CAPILLARY: 135 mg/dL — AB (ref 65–99)
Glucose-Capillary: 84 mg/dL (ref 65–99)

## 2015-02-13 NOTE — Progress Notes (Signed)
D:Patient in the hallway on approach.  Patient states she has had some increased anxiety today.  Patient states here anxiety worsened after she heard a voice say, "hey" and no one was around.  Patient states she is fearful that auditory hallucinations are coming back.  Patient states she was also upset about not being discharged today.  Patient denies SI/HI.  Patient verbally contracts for safety.  Patient states, "as long as I take my medications on time I do not hear voices." A: Staff to monitor Q 15 mins for safety.  Encouragement and support offered.  Scheduled medications administered per orders. R: Patient remains safe on the unit.  Patient attended group tonight. Patient visible on the unit and interacting with peers.  Patient taking administered medications.

## 2015-02-13 NOTE — BHH Group Notes (Addendum)
[place] [facility] Group Notes:  (Nursing/MHT/Case Management/Adjunct)  Date:  02/13/2015  Time:  10:00  Type of Therapy:  Nurse Education  Participation Level:  Minimal  Participation Quality:  Attentive  Affect:  Appropriate  Cognitive:  Alert  Insight:  Limited  Engagement in Group:  Engaged  Modes of Intervention:  Discussion and Education  Summary of Progress/Problems:  Group topic was Recovery.  Discussed setting appropriate goals, importance of sleeping and making sure to use coping skills.  [name] [name] was able to talk about having friends around that are very supportive in her recovery.

## 2015-02-13 NOTE — Progress Notes (Signed)
DAR Note: [name] [name] and has visible on the unit.  Interacting well with peers.  She denies any SI/HI or A/V hallucinations.  She deneis any pain or discomfort.  She appears to be in no physical distress.  She reported to staff that she isn't going to move to [place] because the [place] told her that she shouldn't travel today.  She completed her self inventory and reports that her anxiety, depression and hopelessness are 0/10.  Her goal for today is "transportation" and she will accomplish this goal by "calling for help."  Blood sugar at lunch was 103 requiring no insulin coverage.  Encouraged continued participation in group and unit activities.  Q 15 minute checks maintained for safety.  We will continue to monitor the progress towards her goals.

## 2015-02-13 NOTE — Progress Notes (Signed)
Sh[place]era signed the 72 hour request for discharge at 1650.  Copy placed on the front of the chart.

## 2015-02-13 NOTE — Progress Notes (Signed)
D: Pt is alert and oriented x 4; Pt denies depression, anxiety, pain, SI, HI and AVH; she states, "The Lord told me not to go to [place] and I feel much better." Pt was observed having appropriate conversation with peers. Pt remained calm and cooperative through the shift assessment. A: Medications offered as prescribed.  Support, encouragement, and safe environment provided.  15-minute safety checks continue. R: Pt was med compliant.  Pt did not attend group. Safety checks continue.

## 2015-02-13 NOTE — BHH Group Notes (Signed)
[place] LCSW Group Therapy  02/13/2015 , 3:24 PM   Type of Therapy:  Group Therapy  Participation Level:  Active  Participation Quality:  Attentive  Affect:  Appropriate  Cognitive:  Alert  Insight:  Improving  Engagement in Therapy:  Engaged  Modes of Intervention:  Discussion, Exploration and Socialization  Summary of Progress/Problems: Today's group focused on the term Diagnosis.  Participants were asked to define the term, and then pronounce whether it is a negative, positive or neutral term. Stayed the entire time, engaged throughout.  Active throughout.  Each time she contributed, she was initially on topic and appropriate, but she would soon up contributing irrelevant but personal information.  Required much redirection.  "I'm glad I'm staying another day because it's really depression I am dealing with, even though I may not seem that way."  [name] 02/13/2015 , 3:24 PM

## 2015-02-13 NOTE — Progress Notes (Signed)
Adult Psychoeducational Group Note  Date:  02/13/2015 Time:  8:52 PM  Group Topic/Focus:  Wrap-Up Group:   The focus of this group is to help patients review their daily goal of treatment and discuss progress on daily workbooks.  Participation Level:  Active  Participation Quality:  Appropriate  Affect:  Appropriate  Cognitive:  Appropriate  Insight: Appropriate  Engagement in Group:  Engaged  Modes of Intervention:  Discussion  Additional Comments: The patient expressed that she attended group.The patient also said that she rates her day 7.  [name] [name] 02/13/2015, 8:52 PM

## 2015-02-13 NOTE — Tx Team (Signed)
Interdisciplinary Treatment Plan Update (Adult)  Date:  02/13/2015   Time Reviewed:  3:28 PM   Progress in Treatment: Attending groups: Pt is new to milieu, continuing to assess . Participating in groups:  Pt is new to milieu, continuing to assess . Taking medication as prescribed:  Yes. Tolerating medication:  Yes. Family/Significant other contact made:  No Patient understands diagnosis:  Yes  As evidenced by seeking help with "getting on my meds again" Discussing patient identified problems/goals with staff:  Yes, see initial care plan. Medical problems stabilized or resolved:  Yes. Denies suicidal/homicidal ideation: Yes. Issues/concerns per patient self-inventory:  No. Other:  New problem(s) identified: None identified at this time.   Discharge Plan or Barriers: return home, follow up outpt  Reason for Continuation of Hospitalization:   Comments:  Pt recently discharged from [facility] and has returned due to rapid decompensation with tangential and pressured speech and delusional thought content.  Medication trial of Thorazine 64m bid and Trileptal 3020mbid  Estimated length of stay: [facility] d/c tomorrow  New goal(s):  Review of initial/current patient goals per problem list:   Review of initial/current patient goals per problem list:  1. Goal(s): Patient will participate in aftercare plan   Met: Yes   Target date: 3-5 days post admission date   As evidenced by: Patient will participate within aftercare plan AEB aftercare provider and housing plan at discharge being identified. 02/08/15:  Return home, follow up outpt       5. Goal(s): Patient will demonstrate decreased signs of psychosis  * Met: Yes  * Target date: 3-5 days post admission date  * As evidenced by: Patient will demonstrate decreased frequency of AVH or return to baseline function 02/08/15: Pt recently admitted with delusional thought content. 02/13/15:  No signs nor symptoms of psychosis  today.    6. Goal (s): Patient will demonstrate decreased signs of mania  * Met: Yes  * Target date: 3-5 days post admission date  * As evidenced by: Patient demonstrate decreased signs of mania AEB decreased mood instability and return to baseline functioning 02/08/15: Pt recently admitted with tangential and pressured speech and delusional thought content 02/13/15:  Mood is stable     Attendees: Patient:  02/13/2015 3:28 PM   Family:   02/13/2015 3:28 PM   Physician:  SaUrsula AlertMD 02/13/2015 3:28 PM   Nursing:  LaIdell PicklesRN 02/13/2015 3:28 PM   CSW:    RoRoque LiasLCSW   02/13/2015 3:28 PM   Other:  02/13/2015 3:28 PM   Other:   02/13/2015 3:28 PM   Other:  JeLars PinksNurse CM 02/13/2015 3:28 PM   Other:   02/13/2015 3:28 PM   Other:  DeNorberto SorensonP4Padroni2/07/2015 3:28 PM   Other:  02/13/2015 3:28 PM   Other:  02/13/2015 3:28 PM   Other:  02/13/2015 3:28 PM   Other:  02/13/2015 3:28 PM   Other:  02/13/2015 3:28 PM   Other:   02/13/2015 3:28 PM    Scribe for Treatment Team:   RoRipley Fraise2/07/2015 3:28 PM

## 2015-02-13 NOTE — Progress Notes (Signed)
[facility] MD Progress Note  02/13/2015 3:06 PM [name] [name]  MRN:  [id number]   Subjective:  Patient states " I feel better, but I have changed my mind about going to [place]. I got an answer from God today that I should not go there.'   Objective:[name] [name] is a 35 y.o.AA female , separated , on SSD , currently lives with a pastor , with whom she moved to [place] from [place] recently, has a hx of schizoaffective do, presented very disorganzied. Patient seen and chart reviewed.Case discussed with treatment team. Pt today seen as anxious , talks at length about why she should not go to [place]. Pt otherwise denies paranoia , mood lability or sleep issues. Pt however appears vaguely irritable when she talks about her room mate and how she cannot get along with her. Pt is compliant on medications. Denies ADRs. Per staff - pt with multiple somatic complaints - continues to need support and encouragement.   Principal Problem: Schizoaffective disorder, bipolar type ([place]) Diagnosis:   Patient Active Problem List   Diagnosis Date Noted  . Diabetes mellitus ([place]) [E11.9] 02/08/2015  . Schizoaffective disorder, bipolar type ([place]) [F25.0] 01/28/2015  . Non compliance w medication regimen [Z91.14]    Total Time spent with patient: 25 minutes  Past Psychiatric History: :Pt has a hx of schizoaffective do, bipolar type, had several admissions in [place] as well as [place]. Pt denies past hx of suicide attempts  Past Medical History:  Past Medical History  Diagnosis Date  . Diabetes mellitus without complication ([place])   . Schizophrenia ([place])   . Bipolar affective disorder, currently manic, mild ([place])     Family History: Denies hx of DM, HTN,heart disease, thyroid disease in family Family History  Problem Relation Age of Onset  . Drug abuse Maternal Uncle    Family Psychiatric  History: as described above.  Social History: Pt is separated , lives with a pastor  with whom she were having a relationship with , is on SSD History  Alcohol Use No     History  Drug Use Not on file    Social History   Social History  . Marital Status: Legally Separated    Spouse Name: N/A  . Number of Children: N/A  . Years of Education: N/A   Social History Main Topics  . Smoking status: Never Smoker   . Smokeless tobacco: None  . Alcohol Use: No  . Drug Use: None  . Sexual Activity: Not Asked   Other Topics Concern  . None   Social History Narrative   Additional Social History:    Pain Medications: see PTA  Prescriptions: see PTA  Over the Counter: see PTA  History of alcohol / drug use?: No history of alcohol / drug abuse     Sleep: Fair  Appetite:  Fair  Current Medications: Current Facility-Administered Medications  Medication Dose Route Frequency Provider Last Rate Last Dose  . alum & mag hydroxide-simeth (MAALOX/MYLANTA) 200-200-20 MG/5ML suspension 30 mL  30 mL Oral Q4H PRN Delfin Gant, NP   30 mL at 02/11/15 2111  . ARIPiprazole (ABILIFY) tablet 5 mg  5 mg Oral QHS Ursula Alert, MD   5 mg at 02/12/15 2113  . benztropine (COGENTIN) tablet 0.5 mg  0.5 mg Oral QHS Ursula Alert, MD   0.5 mg at 02/12/15 2110  . divalproex (DEPAKOTE ER) 24 hr tablet 750 mg  750 mg Oral QHS Ursula Alert, MD  750 mg at 02/12/15 2110  . docusate sodium (COLACE) capsule 100 mg  100 mg Oral BID Kerrie Buffalo, NP   100 mg at 02/13/15 0831  . fesoterodine (TOVIAZ) tablet 4 mg  4 mg Oral Daily Ursula Alert, MD   4 mg at 02/13/15 0831  . guaiFENesin (MUCINEX) 12 hr tablet 600 mg  600 mg Oral BID Kathlee Nations, MD   600 mg at 02/13/15 0831  . hydrOXYzine (ATARAX/VISTARIL) tablet 25 mg  25 mg Oral Q6H PRN Ursula Alert, MD      . ibuprofen (ADVIL,MOTRIN) tablet 600 mg  600 mg Oral Q6H PRN Ursula Alert, MD   600 mg at 02/12/15 1659  . insulin aspart (novoLOG) injection 0-15 Units  0-15 Units Subcutaneous TID WC Ursula Alert, MD   3 Units at 02/08/15  1235  . LORazepam (ATIVAN) tablet 1 mg  1 mg Oral Q8H PRN Delfin Gant, NP   1 mg at 02/07/15 1952  . magnesium hydroxide (MILK OF MAGNESIA) suspension 30 mL  30 mL Oral Daily PRN Delfin Gant, NP      . metFORMIN (GLUCOPHAGE) tablet 500 mg  500 mg Oral Q breakfast Ursula Alert, MD   500 mg at 02/13/15 0831  . multivitamin with minerals tablet 1 tablet  1 tablet Oral Daily Ursula Alert, MD   1 tablet at 02/13/15 0831  . neomycin-bacitracin-polymyxin (NEOSPORIN) ointment   Topical BID Inioluwa Baris, MD      . nicotine (NICODERM CQ - dosed in mg/24 hours) patch 21 mg  21 mg Transdermal Daily Patrecia Pour, NP   21 mg at 02/11/15 0800  . OLANZapine zydis (ZYPREXA) disintegrating tablet 5 mg  5 mg Oral Q6H PRN Ursula Alert, MD      . ondansetron (ZOFRAN) tablet 4 mg  4 mg Oral Q8H PRN Patrecia Pour, NP      . zolpidem (AMBIEN) tablet 5 mg  5 mg Oral QHS Ursula Alert, MD   5 mg at 02/12/15 2110    Lab Results:  Results for orders placed or performed during the hospital encounter of 02/07/15 (from the past 48 hour(s))  Glucose, capillary     Status: Abnormal   Collection Time: 02/11/15  4:31 PM  Result Value Ref Range   Glucose-Capillary 104 (H) 65 - 99 mg/dL  Glucose, capillary     Status: Abnormal   Collection Time: 02/11/15  8:40 PM  Result Value Ref Range   Glucose-Capillary 133 (H) 65 - 99 mg/dL  Glucose, capillary     Status: Abnormal   Collection Time: 02/12/15  5:41 AM  Result Value Ref Range   Glucose-Capillary 129 (H) 65 - 99 mg/dL  Valproic acid level     Status: None   Collection Time: 02/12/15  6:54 AM  Result Value Ref Range   Valproic Acid Lvl 86 50.0 - 100.0 ug/mL    Comment: Performed at [facility]  Glucose, capillary     Status: Abnormal   Collection Time: 02/12/15 11:37 AM  Result Value Ref Range   Glucose-Capillary 108 (H) 65 - 99 mg/dL  Glucose, capillary     Status: Abnormal   Collection Time: 02/12/15  4:53 PM  Result  Value Ref Range   Glucose-Capillary 106 (H) 65 - 99 mg/dL  Glucose, capillary     Status: Abnormal   Collection Time: 02/12/15  8:55 PM  Result Value Ref Range   Glucose-Capillary 149 (H) 65 - 99 mg/dL  Glucose, capillary     Status: Abnormal   Collection Time: 02/13/15  6:35 AM  Result Value Ref Range   Glucose-Capillary 139 (H) 65 - 99 mg/dL    Physical Findings: AIMS: Facial and Oral Movements Muscles of Facial Expression: None, normal Lips and Perioral Area: None, normal Jaw: None, normal Tongue: None, normal,Extremity Movements Upper (arms, wrists, hands, fingers): None, normal Lower (legs, knees, ankles, toes): None, normal, Trunk Movements Neck, shoulders, hips: None, normal, Overall Severity Severity of abnormal movements (highest score from questions above): None, normal Incapacitation due to abnormal movements: None, normal Patient's awareness of abnormal movements (rate only patient's report): No Awareness, Dental Status Current problems with teeth and/or dentures?: No Does patient usually wear dentures?: No  CIWA:  CIWA-Ar Total: 2 COWS:  COWS Total Score: 4  Musculoskeletal: Strength & Muscle Tone: within normal limits Gait & Station: normal Patient leans: N/A  Psychiatric Specialty Exam: Review of Systems  Psychiatric/Behavioral: The patient is nervous/anxious.   All other systems reviewed and are negative.   Blood pressure 118/75, pulse 97, temperature 98.2 F (36.8 C), temperature source Oral, resp. rate 20, height 5' 3.5" (1.613 m), weight 90.719 kg (200 lb), last menstrual period 01/25/2015.Body mass index is 34.87 kg/(m^2).  General Appearance: Casual  Eye Contact::  Fair  Speech:  Clear and Coherent  Volume:  Normal  Mood:  Anxious  Affect:  Congruent  Thought Process:  Circumstantial  Orientation:  Full (Time, Place, and Person)  Thought Content:  Paranoid Ideation and Rumination improving  Suicidal Thoughts:  No  Homicidal Thoughts:  No   Memory:  Immediate;   Fair Recent;   Fair Remote;   Fair  Judgement:  Fair  Insight:  Shallow  Psychomotor Activity:  Restlessness  Concentration:  Fair  Recall:  [employer] of Knowledge:Fair  Language: Fair  Akathisia:  No  Handed:  Right  AIMS (if indicated):     Assets:  Desire for Improvement  ADL's:  Intact  Cognition: WNL  Sleep:  Number of Hours: 5.75   Treatment Plan Summary:[name] [name] is a 35 y.o.AA female , who has a hx of schizoaffective do, who presented after she were noncompliant on her medications. Pt today appears anxious , otherwise tolerating her medications well. Will continue treatment.  Daily contact with patient to assess and evaluate symptoms and progress in treatment and Medication management  Continue Depakote ER 750 mg po qhs for mood sx. Depakote level - 02/12/15- 86 ug/ml Will continue Abilify 5 mg po qhs for psychosis.Discussed Abilify Maintena IM -pt declines. Will continue  Cogentin 0.5 mg po qhs for EPS. Will continue  PRN medications as per agitation protocol. Will continue to monitor vitals ,medication compliance and treatment side effects while patient is here.  Pt with hx of Diabetes , Continue Metformin 500 mg po daily. Added Toviaz 4 mg po daily for overactive bladder. Will monitor for medical issues as well as call consult as needed.  Reviewed labs tsh( 02/01/15) - wnl, lipid panel ( 02/01/15) - wnl, PL ( 02/01/15) - wnl , cbc - Hb- slightly low , cmp - wnl ,ekg reviewed, UA - wnl, UDS- negative, BAL <5. CSW will start working on disposition.  Patient to participate in therapeutic milieu .   [name] Saur, MD 02/13/2015, 3:06 PM

## 2015-02-14 LAB — GLUCOSE, CAPILLARY: Glucose-Capillary: 120 mg/dL — ABNORMAL HIGH (ref 65–99)

## 2015-02-14 MED ORDER — DIVALPROEX SODIUM ER 250 MG PO TB24: 750.0000 mg | ORAL_TABLET | Freq: Every day | ORAL | Status: DC

## 2015-02-14 MED ORDER — HYDROXYZINE HCL 25 MG PO TABS: 25.0000 mg | ORAL_TABLET | Freq: Four times a day (QID) | ORAL | Status: DC | PRN

## 2015-02-14 MED ORDER — DOCUSATE SODIUM 100 MG PO CAPS: 100.0000 mg | ORAL_CAPSULE | Freq: Two times a day (BID) | ORAL | Status: DC

## 2015-02-14 MED ORDER — ZOLPIDEM TARTRATE 5 MG PO TABS: 5.0000 mg | ORAL_TABLET | Freq: Every day | ORAL | Status: DC

## 2015-02-14 MED ORDER — ARIPIPRAZOLE 5 MG PO TABS: 5.0000 mg | ORAL_TABLET | Freq: Every day | ORAL | Status: DC

## 2015-02-14 MED ORDER — FESOTERODINE FUMARATE ER 4 MG PO TB24: 4.0000 mg | ORAL_TABLET | Freq: Every day | ORAL | Status: DC

## 2015-02-14 MED ORDER — NICOTINE 21 MG/24HR TD PT24: 21.0000 mg | MEDICATED_PATCH | Freq: Every day | TRANSDERMAL | Status: DC

## 2015-02-14 MED ORDER — METFORMIN HCL 500 MG PO TABS: 500.0000 mg | ORAL_TABLET | Freq: Every day | ORAL | Status: DC

## 2015-02-14 MED ORDER — BENZTROPINE MESYLATE 0.5 MG PO TABS: 0.5000 mg | ORAL_TABLET | Freq: Every day | ORAL | Status: DC

## 2015-02-14 NOTE — Progress Notes (Signed)
Orders received for patients discharge. Discharge instructions reviewed with patient at length, including follow up plan, as well as crisis services and medications. AVS reviewed as well as transition of care reviewed. Patient verbalized understanding, and opportunity for questions provided. Rx provided. Patient denies any SI/HI/A/V Hallucinations. NO signs of acute decompensation. Pt belongings returned and bus passes provided and escorted from unit with Probation officer to lobby.

## 2015-02-14 NOTE — Progress Notes (Signed)
  [facility] Adult Case Management Discharge Plan :  Will you be returning to the same living situation after discharge:  Yes,  back with pastor At discharge, do you have transportation home?: Yes,  bus pass Do you have the ability to pay for your medications: Yes,  MCD  Release of information consent forms completed and in the chart;  Patient's signature needed at discharge.  Patient to Follow up at: Follow-up Information    Follow up with [facility].   Why:  Call them when you get home to set up an appointment with the Dr.  I wasn't able to get this accomplished before you left.   Contact information:   [address]   [place] Phone:  [phone]      Next level of care provider has access to College and Suicide Prevention discussed: Yes,  yes  Have you used any form of tobacco in the last 30 days? (Cigarettes, Smokeless Tobacco, Cigars, and/or Pipes): No  Has patient been referred to the Quitline?: N/A patient is not a smoker  Patient has been referred for addiction treatment: N/A  [name] 02/14/2015, 10:32 AM

## 2015-02-14 NOTE — [facility] Suicide Risk Assessment (Signed)
[facility] Discharge Suicide Risk Assessment   Principal Problem: Schizoaffective disorder, bipolar type [facility]) Discharge Diagnoses:  Patient Active Problem List   Diagnosis Date Noted  . Diabetes mellitus ([place]) [E11.9] 02/08/2015  . Schizoaffective disorder, bipolar type ([place]) [F25.0] 01/28/2015  . Non compliance w medication regimen [Z91.14]     Total Time spent with patient: 30 minutes  Musculoskeletal: Strength & Muscle Tone: within normal limits Gait & Station: normal Patient leans: N/A  Psychiatric Specialty Exam: Review of Systems  Psychiatric/Behavioral: Negative for depression, suicidal ideas and hallucinations. The patient is not nervous/anxious.   All other systems reviewed and are negative.   Blood pressure 116/74, pulse 133, temperature 97.6 F (36.4 C), temperature source Oral, resp. rate 16, height 5' 3.5" (1.613 m), weight 90.719 kg (200 lb), last menstrual period 01/25/2015.Body mass index is 34.87 kg/(m^2).  General Appearance: Casual  Eye Contact::  Fair  Speech:  Clear and [id number]  Volume:  Normal  Mood:  Euthymic  Affect:  Appropriate  Thought Process:  Goal Directed  Orientation:  Full (Time, Place, and Person)  Thought Content:  WDL  Suicidal Thoughts:  No  Homicidal Thoughts:  No  Memory:  Immediate;   Fair Recent;   Fair Remote;   Fair  Judgement:  Fair  Insight:  Fair  Psychomotor Activity:  Normal  Concentration:  Fair  Recall:  [employer] of Knowledge:Fair  Language: Fair  Akathisia:  No  Handed:  Right  AIMS (if indicated):     Assets:  Desire for Improvement Physical Health Social Support  Sleep:  Number of Hours: 6  Cognition: WNL  ADL's:  Intact   Mental Status Per Nursing Assessment::   On Admission:     Demographic Factors:  separated  Loss Factors: NA  Historical Factors: Impulsivity  Risk Reduction Factors:   Positive social support  Continued Clinical Symptoms:  Previous Psychiatric Diagnoses and  Treatments  Cognitive Features That Contribute To Risk:  Polarized thinking    Suicide Risk:  Minimal: No identifiable suicidal ideation.  Patients presenting with no risk factors but with morbid ruminations; may be classified as minimal risk based on the severity of the depressive symptoms  Follow-up Information    Follow up with [facility].   Contact information:   [address]   [place] Phone:  [phone]      Plan Of Care/Follow-up recommendations:  Activity:  no restrictions Diet:  regular Tests:  as needed Other:  follow up with aftercare  Hooper Petteway, MD 02/14/2015, 9:34 AM
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
## 2015-05-16 ENCOUNTER — Encounter (HOSPITAL_COMMUNITY): Payer: Self-pay

## 2015-05-16 ENCOUNTER — Emergency Department (HOSPITAL_COMMUNITY)
Admission: EM | Admit: 2015-05-16 | Discharge: 2015-05-16 | Disposition: A | Discharge status: Home / Self Care | Payer: Medicare Other | Attending: Emergency Medicine | Admitting: Emergency Medicine

## 2015-05-16 DIAGNOSIS — Z760 Encounter for issue of repeat prescription: Secondary | ICD-10-CM | POA: Diagnosis not present

## 2015-05-16 DIAGNOSIS — Z8659 Personal history of other mental and behavioral disorders: Secondary | ICD-10-CM | POA: Insufficient documentation

## 2015-05-16 DIAGNOSIS — Z79899 Other long term (current) drug therapy: Secondary | ICD-10-CM | POA: Diagnosis not present

## 2015-05-16 DIAGNOSIS — G4700 Insomnia, unspecified: Secondary | ICD-10-CM | POA: Diagnosis not present

## 2015-05-16 DIAGNOSIS — R6883 Chills (without fever): Secondary | ICD-10-CM | POA: Diagnosis not present

## 2015-05-16 DIAGNOSIS — M791 Myalgia: Secondary | ICD-10-CM | POA: Diagnosis not present

## 2015-05-16 DIAGNOSIS — Z7984 Long term (current) use of oral hypoglycemic drugs: Secondary | ICD-10-CM | POA: Diagnosis not present

## 2015-05-16 DIAGNOSIS — E119 Type 2 diabetes mellitus without complications: Secondary | ICD-10-CM | POA: Insufficient documentation

## 2015-05-16 LAB — COMPREHENSIVE METABOLIC PANEL
ALBUMIN: 4.4 g/dL (ref 3.5–5.0)
ALK PHOS: 40 U/L (ref 38–126)
ALT: 16 U/L (ref 14–54)
ANION GAP: 10 (ref 5–15)
AST: 19 U/L (ref 15–41)
BILIRUBIN TOTAL: 0.5 mg/dL (ref 0.3–1.2)
BUN: 14 mg/dL (ref 6–20)
CALCIUM: 9.5 mg/dL (ref 8.9–10.3)
CO2: 27 mmol/L (ref 22–32)
CREATININE: 0.92 mg/dL (ref 0.44–1.00)
Chloride: 106 mmol/L (ref 101–111)
GFR calc Af Amer: >60 mL/min (ref >60)
GFR calc non Af Amer: >60 mL/min (ref >60)
GLUCOSE: 119 mg/dL — AB (ref 65–99)
Potassium: 4.1 mmol/L (ref 3.5–5.1)
SODIUM: 143 mmol/L (ref 135–145)
TOTAL PROTEIN: 7.9 g/dL (ref 6.5–8.1)

## 2015-05-16 LAB — CBC
HEMATOCRIT: 37.8 % (ref 36.0–46.0)
Hemoglobin: 12.8 g/dL (ref 12.0–15.0)
MCH: 31.1 pg (ref 26.0–34.0)
MCHC: 33.9 g/dL (ref 30.0–36.0)
MCV: 92 fL (ref 78.0–100.0)
PLATELETS: 188 10*3/uL (ref 150–400)
RBC: 4.11 MIL/uL (ref 3.87–5.11)
RDW: 13.9 % (ref 11.5–15.5)
WBC: 4.9 10*3/uL (ref 4.0–10.5)

## 2015-05-16 LAB — ETHANOL: Alcohol, Ethyl (B): <5 mg/dL (ref <5)

## 2015-05-16 LAB — VALPROIC ACID LEVEL: VALPROIC ACID LVL: 77 ug/mL (ref 50.0–100.0)

## 2015-05-16 NOTE — ED Notes (Signed)
Provider at bedside to evaluate pt.

## 2015-05-16 NOTE — Discharge Instructions (Signed)
Read the information below.  Use the prescribed medication as directed.  Please discuss all new medications with your pharmacist.  You may return to the Emergency Department at any time for worsening condition or any new symptoms that concern you.   Be sure to call and follow up with [facility] for medication management.  [place] [address]  [place] Phone: [phone]  Return to the ED if your symptoms worsen, if you have thoughts of hurting yourself or others, or you develop fever, shortness of breath, chest pain, or loss of consciousness.

## 2015-05-16 NOTE — ED Provider Notes (Signed)
CSN: [id number]     Arrival date & time 05/16/15  [id number] History   First MD Initiated Contact with Patient 05/16/15 0600     Chief Complaint  Patient presents with  . Medication Refill     (Consider location/radiation/quality/duration/timing/severity/associated sxs/prior Treatment) HPI Comments: [name] [name] is a 35 y.o. female with history of T2DM, schizoaffective, and bipolar disorder presents to ED for "rest." She has tried melatonin, but reports it is not helping. She feels scared at night, but won't elaborate why, and needs "ambien to sleep." She states she feels safe in her home. Endorses she sleeps every night. Slept 4 hours last night. She believes God speaks to her and she actively responds to what she hears. She does see things that others may not see. She denies depression, anxiety, suicidal, or homicidal thoughts.   On prior [facility] discharge, it was recommended she follow up with [facility]; however, due to "memory problems" patient did not have the phone number and could not recall the name of the place to follow up. She is requesting the contact information for [facility] as she would like to establish her care and get the "medications I need." She also reports Geodon makes her eyes water, gives the feeling of her "throat swelling," and affects her sleep - she has not been taking it as prescribed. She has been taking her Depakote "sparingly" because she was concerned she was going to "run out." She is requesting 48-hours observation admission to get her "medications right."    The history is provided by the patient and medical records. The history is limited by the condition of the patient.    Past Medical History  Diagnosis Date  . Diabetes mellitus without complication ([place])   . Schizophrenia ([place])   . Bipolar affective disorder, currently manic, mild ([place])    History reviewed. No pertinent past surgical history. Family History  Problem Relation Age of Onset  . Drug  abuse Maternal Uncle    Social History  Substance Use Topics  . Smoking status: Never Smoker   . Smokeless tobacco: None  . Alcohol Use: No   OB History    No data available     Review of Systems  Constitutional: Positive for chills. Negative for fever and diaphoresis.  Respiratory: Negative for shortness of breath.   Cardiovascular: Negative for chest pain and leg swelling.  Gastrointestinal: Negative for abdominal pain.  Genitourinary: Negative for dysuria and hematuria.  Musculoskeletal: Positive for myalgias (anterior shin b/l secondary to work).  Allergic/Immunologic: Positive for immunocompromised state ( T2DM).  Psychiatric/Behavioral: Positive for sleep disturbance.      Allergies  Review of patient's allergies indicates no known allergies.  Home Medications   Prior to Admission medications   Medication Sig Start Date End Date Taking? Authorizing Provider  ARIPiprazole (ABILIFY) 5 MG tablet Take 1 tablet (5 mg total) by mouth at bedtime. For mood control 02/14/15   Encarnacion Slates, NP  benztropine (COGENTIN) 0.5 MG tablet Take 1 tablet (0.5 mg total) by mouth at bedtime. For prevention of drug induced tremors 02/14/15   Encarnacion Slates, NP  divalproex (DEPAKOTE ER) 250 MG 24 hr tablet Take 3 tablets (750 mg total) by mouth at bedtime. For mood stabilization 02/14/15   Encarnacion Slates, NP  docusate sodium (COLACE) 100 MG capsule Take 1 capsule (100 mg total) by mouth 2 (two) times daily. (May purchase from over the counter): For constipation 02/14/15   Encarnacion Slates, NP  fesoterodine (TOVIAZ) 4 MG TB24 tablet Take 1 tablet (4 mg total) by mouth daily. For over active bladder 02/14/15   Encarnacion Slates, NP  hydrOXYzine (ATARAX/VISTARIL) 25 MG tablet Take 1 tablet (25 mg total) by mouth every 6 (six) hours as needed for anxiety. 02/14/15   Encarnacion Slates, NP  metFORMIN (GLUCOPHAGE) 500 MG tablet Take 1 tablet (500 mg total) by mouth daily with breakfast. For diabetes management 02/14/15   Encarnacion Slates, NP  nicotine (NICODERM CQ - DOSED IN MG/24 HOURS) 21 mg/24hr patch Place 1 patch (21 mg total) onto the skin daily. For smoking cessation 02/14/15   Encarnacion Slates, NP  zolpidem (AMBIEN) 5 MG tablet Take 1 tablet (5 mg total) by mouth at bedtime. For insomnia 02/14/15   Encarnacion Slates, NP   BP 142/91 mmHg  Pulse 99  Temp(Src) 98.3 F (36.8 C) (Oral)  Resp 18  Ht 5\' 4"  (1.626 m)  Wt 90.719 kg  BMI 34.31 kg/m2  SpO2 99% Physical Exam  Constitutional: She appears well-developed and well-nourished. No distress.  HENT:  Head: Normocephalic and atraumatic.  Mouth/Throat: Oropharynx is clear and moist. No oropharyngeal exudate.  Patient is managing her oral secretions.   Eyes: Conjunctivae are normal. Pupils are equal, round, and reactive to light. Right eye exhibits no discharge. Left eye exhibits no discharge. No scleral icterus.  Neck: Normal range of motion. Neck supple.  Cardiovascular: Normal rate, regular rhythm and normal heart sounds.   No murmur heard. Pulmonary/Chest: Effort normal and breath sounds normal. No respiratory distress. She has no wheezes.  Musculoskeletal: Normal range of motion. She exhibits no tenderness.  Lymphadenopathy:    She has no cervical adenopathy.  Neurological: She is alert. Coordination normal.  Skin: Skin is warm and dry. She is not diaphoretic.  Psychiatric: She has a normal mood and affect. She expresses no homicidal and no suicidal ideation.  Speech is continuous and tangential. Difficulty to keep on task regarding medical history and current complaint. She will be mid-sentence and forget the point she was trying to make. The "Lesia Hausen" is speaking with her on occasion during the exam.     ED Course  Procedures (including critical care time) Labs Review Labs Reviewed  COMPREHENSIVE METABOLIC PANEL - Abnormal; Notable for the following:    Glucose, Bld 119 (*)    All other components within normal limits  VALPROIC ACID LEVEL  CBC  ETHANOL     Imaging Review No results found. I have personally reviewed and evaluated these images and lab results as part of my medical decision-making.   EKG Interpretation None      MDM   Final diagnoses:  Medication management  Insomnia    Marg Koebel is a 35 y.o. female with history of schizoaffective, bipolar, and T2DM reports to ED with insomnia. She is here for medication management - she misplaced the contact information for [facility] and would like to receive the number so she can get her medications and help she needs. Patient is afebrile and non-toxic. Patient is managing her oral secretions. Lungs are clear to auscultation b/l. Physical exam is unremarkable. CBC, CMP unremarkable. Ethanol negative. Valproic acid within range. Consulted TTS.   7:30am: Per nursing, patient stated she found the prescription for her depakote and no longer needed to receive treatment or stay. I personally talked with patient and she further endorsed that she found her prescription and was ready to go. During our conversation, TTS came  and patient declined consult. Patient is afebrile and non-toxic. She denies suicidal or homicidal ideations.   Stressed the importance of patient getting her medications refilled. Provided contact information for both [employer] and [employer]. Encouraged patient to call [facility] today to schedule a follow up appointment. Patient voiced understanding and is agreeable.       Roxanna Mew, [place] 05/16/15 1532  Merryl Hacker, MD 05/16/15 2251

## 2015-05-16 NOTE — BH Assessment (Signed)
TTS consult ordered for this patient. Writer attempted to see patient face to face. Adalberto Ill, PA-C was in the room with patient. The patient was stating that she wanted to discharge. Per Adalberto Ill, patient is ok to discharge and also agreed to remove the TTS order.

## 2015-05-16 NOTE — ED Notes (Signed)
The writer walked in pt's room to introduce self, pt was packing her belongings and stated that she found her melatonin and other meds, states she already took them , also said found her prescription for Depakote. Pt states wanted to leave and no longer need treatment. Luther Bradley PA , made aware. Pt is not IVC.

## 2015-05-16 NOTE — ED Notes (Signed)
Pt states that she cant sleep and melatonin isn't working and would like to have Ambien, she had a prescription in the past in [place], she would like to have some to help her sleep. Pt is sent up with [facility] but doesn't know when her appointment is.

## 2015-05-20 ENCOUNTER — Ambulatory Visit (HOSPITAL_COMMUNITY): Admission: RE | Admit: 2015-05-20 | Payer: Medicare Other | Source: Home / Self Care | Admitting: Psychiatry

## 2015-05-20 ENCOUNTER — Encounter (HOSPITAL_COMMUNITY): Payer: Self-pay | Admitting: Emergency Medicine

## 2015-05-20 ENCOUNTER — Emergency Department (HOSPITAL_COMMUNITY)
Admission: EM | Admit: 2015-05-20 | Discharge: 2015-05-20 | Discharge status: Against Medical Advice | Payer: Medicare Other

## 2015-05-20 ENCOUNTER — Emergency Department (HOSPITAL_COMMUNITY)
Admission: EM | Admit: 2015-05-20 | Discharge: 2015-05-20 | Disposition: A | Discharge status: Home / Self Care | Payer: Medicare Other | Attending: Emergency Medicine | Admitting: Emergency Medicine

## 2015-05-20 DIAGNOSIS — F3111 Bipolar disorder, current episode manic without psychotic features, mild: Secondary | ICD-10-CM | POA: Insufficient documentation

## 2015-05-20 DIAGNOSIS — F209 Schizophrenia, unspecified: Secondary | ICD-10-CM | POA: Insufficient documentation

## 2015-05-20 DIAGNOSIS — G4700 Insomnia, unspecified: Secondary | ICD-10-CM | POA: Diagnosis not present

## 2015-05-20 DIAGNOSIS — E119 Type 2 diabetes mellitus without complications: Secondary | ICD-10-CM | POA: Diagnosis not present

## 2015-05-20 DIAGNOSIS — Z5321 Procedure and treatment not carried out due to patient leaving prior to being seen by health care provider: Secondary | ICD-10-CM | POA: Diagnosis not present

## 2015-05-20 NOTE — ED Notes (Signed)
Pt stated "She got confirmation from the lord that now she does not need to be seen." Pt has left the building.

## 2015-05-20 NOTE — ED Notes (Signed)
Pt from home with complaints of insomnia. Pt checked in one time today and the left stating that God told her to leave. Pt later rechecked in again for insomnia. Pt stated at time of assessment that she wanted to be admitted to a "week long psych unit". Pt stated that she would only allow vital signs one time and will be refusing all blood draws. Pt denies SI and HI, but refuses to interact or answer any other questions. The answer to every other question during assessment was "That will not be discussed right now."

## 2015-05-25 ENCOUNTER — Emergency Department (HOSPITAL_COMMUNITY)
Admission: EM | Admit: 2015-05-25 | Discharge: 2015-05-25 | Discharge status: Against Medical Advice | Payer: Medicare Other

## 2015-05-31 ENCOUNTER — Encounter (HOSPITAL_COMMUNITY): Payer: Self-pay | Admitting: Nurse Practitioner

## 2015-05-31 ENCOUNTER — Emergency Department (HOSPITAL_COMMUNITY)
Admission: EM | Admit: 2015-05-31 | Discharge: 2015-05-31 | Disposition: A | Discharge status: Home / Self Care | Payer: Medicare Other | Attending: Emergency Medicine | Admitting: Emergency Medicine

## 2015-05-31 DIAGNOSIS — Z5321 Procedure and treatment not carried out due to patient leaving prior to being seen by health care provider: Secondary | ICD-10-CM | POA: Insufficient documentation

## 2015-05-31 NOTE — ED Notes (Signed)
Pt sts she does not need any help anymore and will be heading home.

## 2015-05-31 NOTE — ED Notes (Signed)
Pt presents familiarly to the department initially having told registration that she wanted "medical clearance." She now states that she voluntary checked in so that we can see if case management can get involved to help her get housing. She denied SI/HI.

## 2015-06-13 ENCOUNTER — Emergency Department (HOSPITAL_COMMUNITY)
Admission: EM | Admit: 2015-06-13 | Discharge: 2015-06-14 | Disposition: A | Discharge status: Home / Self Care | Payer: Medicare Other | Attending: Emergency Medicine | Admitting: Emergency Medicine

## 2015-06-13 ENCOUNTER — Encounter (HOSPITAL_COMMUNITY): Payer: Self-pay | Admitting: Emergency Medicine

## 2015-06-13 DIAGNOSIS — Z79899 Other long term (current) drug therapy: Secondary | ICD-10-CM | POA: Diagnosis not present

## 2015-06-13 DIAGNOSIS — E119 Type 2 diabetes mellitus without complications: Secondary | ICD-10-CM | POA: Diagnosis not present

## 2015-06-13 DIAGNOSIS — R079 Chest pain, unspecified: Secondary | ICD-10-CM | POA: Diagnosis present

## 2015-06-13 DIAGNOSIS — F22 Delusional disorders: Secondary | ICD-10-CM

## 2015-06-13 DIAGNOSIS — F309 Manic episode, unspecified: Secondary | ICD-10-CM

## 2015-06-13 DIAGNOSIS — F312 Bipolar disorder, current episode manic severe with psychotic features: Secondary | ICD-10-CM | POA: Diagnosis not present

## 2015-06-13 DIAGNOSIS — F29 Unspecified psychosis not due to a substance or known physiological condition: Secondary | ICD-10-CM

## 2015-06-13 DIAGNOSIS — Z7984 Long term (current) use of oral hypoglycemic drugs: Secondary | ICD-10-CM | POA: Diagnosis not present

## 2015-06-13 LAB — POC URINE PREG, ED: Preg Test, Ur: NEGATIVE

## 2015-06-13 MED ORDER — BENZTROPINE MESYLATE 1 MG PO TABS
0.5000 mg | ORAL_TABLET | Freq: Every day | ORAL | Status: DC
  Administered 2015-06-14: 0.5 mg via ORAL
  Filled 2015-06-13: qty 1

## 2015-06-13 MED ORDER — HYDROXYZINE HCL 25 MG PO TABS: 25.0000 mg | ORAL_TABLET | Freq: Four times a day (QID) | ORAL | Status: DC | PRN

## 2015-06-13 MED ORDER — DIVALPROEX SODIUM ER 500 MG PO TB24
750.0000 mg | ORAL_TABLET | Freq: Every day | ORAL | Status: DC
  Administered 2015-06-14: 750 mg via ORAL
  Filled 2015-06-13: qty 1

## 2015-06-13 MED ORDER — DOCUSATE SODIUM 100 MG PO CAPS
100.0000 mg | ORAL_CAPSULE | Freq: Two times a day (BID) | ORAL | Status: DC
  Administered 2015-06-14: 100 mg via ORAL
  Filled 2015-06-13: qty 1

## 2015-06-13 MED ORDER — FESOTERODINE FUMARATE ER 4 MG PO TB24
4.0000 mg | ORAL_TABLET | Freq: Every day | ORAL | Status: DC
  Filled 2015-06-13: qty 1

## 2015-06-13 MED ORDER — METFORMIN HCL 500 MG PO TABS: 500.0000 mg | ORAL_TABLET | Freq: Every day | ORAL | Status: DC

## 2015-06-13 MED ORDER — ZOLPIDEM TARTRATE 5 MG PO TABS
5.0000 mg | ORAL_TABLET | Freq: Every day | ORAL | Status: DC
  Administered 2015-06-14: 5 mg via ORAL
  Filled 2015-06-13: qty 1

## 2015-06-13 MED ORDER — NICOTINE 21 MG/24HR TD PT24: 21.0000 mg | MEDICATED_PATCH | Freq: Every day | TRANSDERMAL | Status: DC

## 2015-06-13 MED ORDER — ARIPIPRAZOLE 5 MG PO TABS
5.0000 mg | ORAL_TABLET | Freq: Every day | ORAL | Status: DC
  Administered 2015-06-14: 5 mg via ORAL
  Filled 2015-06-13: qty 1
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
## 2015-06-14 ENCOUNTER — Emergency Department (HOSPITAL_COMMUNITY): Payer: Medicare Other

## 2015-06-14 DIAGNOSIS — F312 Bipolar disorder, current episode manic severe with psychotic features: Secondary | ICD-10-CM | POA: Diagnosis not present

## 2015-06-14 LAB — I-STAT TROPONIN, ED: Troponin i, poc: 0 ng/mL (ref 0.00–0.08)

## 2015-06-14 LAB — RAPID URINE DRUG SCREEN, HOSP PERFORMED
Amphetamines: NOT DETECTED
Barbiturates: NOT DETECTED
Benzodiazepines: NOT DETECTED
Cocaine: NOT DETECTED
OPIATES: NOT DETECTED
Tetrahydrocannabinol: NOT DETECTED

## 2015-06-14 LAB — URINALYSIS, ROUTINE W REFLEX MICROSCOPIC
Bilirubin Urine: NEGATIVE
Glucose, UA: NEGATIVE mg/dL
Hgb urine dipstick: NEGATIVE
Ketones, ur: NEGATIVE mg/dL
Leukocytes, UA: NEGATIVE
Nitrite: NEGATIVE
Protein, ur: NEGATIVE mg/dL
Specific Gravity, Urine: 1.015 (ref 1.005–1.030)
pH: 5.5 (ref 5.0–8.0)

## 2015-06-14 LAB — CBC WITH DIFFERENTIAL/PLATELET
Basophils Absolute: 0 10*3/uL (ref 0.0–0.1)
Basophils Relative: 0 %
Eosinophils Absolute: 0 10*3/uL (ref 0.0–0.7)
Eosinophils Relative: 0 %
HCT: 33.5 % — ABNORMAL LOW (ref 36.0–46.0)
Hemoglobin: 11.1 g/dL — ABNORMAL LOW (ref 12.0–15.0)
Lymphocytes Relative: 57 %
Lymphs Abs: 2.9 10*3/uL (ref 0.7–4.0)
MCH: 30.3 pg (ref 26.0–34.0)
MCHC: 33.1 g/dL (ref 30.0–36.0)
MCV: 91.5 fL (ref 78.0–100.0)
Monocytes Absolute: 0.5 10*3/uL (ref 0.1–1.0)
Monocytes Relative: 10 %
Neutro Abs: 1.7 10*3/uL (ref 1.7–7.7)
Neutrophils Relative %: 33 %
Platelets: 155 10*3/uL (ref 150–400)
RBC: 3.66 MIL/uL — ABNORMAL LOW (ref 3.87–5.11)
RDW: 13.7 % (ref 11.5–15.5)
WBC: 5.1 10*3/uL (ref 4.0–10.5)

## 2015-06-14 LAB — COMPREHENSIVE METABOLIC PANEL
ALT: 12 U/L — ABNORMAL LOW (ref 14–54)
AST: 15 U/L (ref 15–41)
Albumin: 3.5 g/dL (ref 3.5–5.0)
Alkaline Phosphatase: 38 U/L (ref 38–126)
Anion gap: 4 — ABNORMAL LOW (ref 5–15)
BUN: 14 mg/dL (ref 6–20)
CO2: 28 mmol/L (ref 22–32)
Calcium: 9.2 mg/dL (ref 8.9–10.3)
Chloride: 106 mmol/L (ref 101–111)
Creatinine, Ser: 0.82 mg/dL (ref 0.44–1.00)
GFR calc Af Amer: >60 mL/min (ref >60)
GFR calc non Af Amer: >60 mL/min (ref >60)
Glucose, Bld: 114 mg/dL — ABNORMAL HIGH (ref 65–99)
Potassium: 4.1 mmol/L (ref 3.5–5.1)
Sodium: 138 mmol/L (ref 135–145)
Total Bilirubin: 0.5 mg/dL (ref 0.3–1.2)
Total Protein: 6.6 g/dL (ref 6.5–8.1)

## 2015-06-14 LAB — VALPROIC ACID LEVEL: Valproic Acid Lvl: 57 ug/mL (ref 50.0–100.0)

## 2015-06-14 MED ORDER — HYDROXYZINE HCL 25 MG PO TABS: ORAL_TABLET | ORAL | Status: DC

## 2015-06-14 MED ORDER — TOLTERODINE TARTRATE 2 MG PO TABS: 2.0000 mg | ORAL_TABLET | Freq: Two times a day (BID) | ORAL | Status: DC

## 2015-06-14 MED ORDER — ZOLPIDEM TARTRATE 5 MG PO TABS: 5.0000 mg | ORAL_TABLET | Freq: Every evening | ORAL | Status: DC | PRN

## 2015-06-14 MED ORDER — DIVALPROEX SODIUM ER 500 MG PO TB24: ORAL_TABLET | ORAL | Status: DC

## 2015-06-14 MED ORDER — LORAZEPAM 2 MG/ML IJ SOLN
2.0000 mg | Freq: Once | INTRAMUSCULAR | Status: AC
  Administered 2015-06-14: 2 mg via INTRAMUSCULAR
  Filled 2015-06-14: qty 1

## 2015-06-14 MED ORDER — HALOPERIDOL LACTATE 5 MG/ML IJ SOLN
5.0000 mg | Freq: Once | INTRAMUSCULAR | Status: AC
  Administered 2015-06-14: 5 mg via INTRAMUSCULAR
  Filled 2015-06-14: qty 1

## 2015-06-14 MED ORDER — ARIPIPRAZOLE 5 MG PO TABS: ORAL_TABLET | ORAL | Status: DC

## 2015-06-14 MED ORDER — BENZTROPINE MESYLATE 0.5 MG PO TABS: ORAL_TABLET | ORAL | Status: DC

## 2015-06-14 NOTE — ED Notes (Signed)
Patient raising voice to staff and refusing to comply

## 2015-06-14 NOTE — ED Notes (Signed)
Pt changed into wine colored scrub top however pts skirt remains; pt reports "it is against my religion to wear pants"

## 2015-06-14 NOTE — ED Provider Notes (Signed)
Patient was evaluated by TTS. They do not feel as though she warrants inpatient psychiatric care. She tells me she is no longer suicidal or homicidal. Medication recommendations have been made by TTS and these will be prescribed for her. She is to follow-up as an outpatient with [facility] and return if she experiences any worsening in her condition.  Veryl Speak, MD 06/14/15 1104
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
## 2015-06-14 NOTE — ED Notes (Addendum)
Spoke with clerk of courts, report they have received the IVC change of commitment papers. Pt can be discharged, Dr. Stark Jock has assessed pt.

## 2015-06-14 NOTE — ED Notes (Signed)
RN entered room politely asking pt to change into wine colored scrubs; pt refusing to do so, stating she does not understand why we are keeping her here; RN explained that pt is IVC'd d/t comments made to ER provider which concerned her for safety; pt continuously stating she is not psychotic; RN explained to patient that I, the RN, was following hospital policy; Security presence requested at this time

## 2015-06-14 NOTE — ED Notes (Addendum)
Patient was given a snack, drink it was placed on bedside table,and a regular diet ordered for lunch.

## 2015-06-14 NOTE — ED Notes (Signed)
Pt wanded by security at this time  ?

## 2015-06-14 NOTE — ED Notes (Signed)
Pt woke up, ambulatory to restroom. Her breakfast was heated for her.

## 2015-06-14 NOTE — BH Assessment (Addendum)
Tele Assessment Note   [name] [name] is a 35 y.o. female who presented to [facility] last night due to chest pain. Pt's speech evidenced paranoia, mania, and delusions and she was subsequently IVC'd by EDP. Pt has maintained that she is not mentally unstable and does not need any mental health assistance. Pt presented as angry and irritable as she was held against her will and given IM geodon. Pt repeatedly indicated that she as not suicidal, homicidal and that she needed "to leave this place", b/c she was "not ill". Pt also indicated that if she needed help, she knows how to go to [facility] for help and pt also reiterated that she only came in for chest pain. Pt did not want to fully participate in the assessment due to her conviction that she did not need to be there. Pt did, however, deny SI, HI, AVH. Pt appeared lucid and present during the assessment.   Diagnosis: Bipolar I  Past Medical History:  Past Medical History  Diagnosis Date  . Diabetes mellitus without complication ([place])   . Schizophrenia ([place])   . Bipolar affective disorder, currently manic, mild ([place])     History reviewed. No pertinent past surgical history.  Family History:  Family History  Problem Relation Age of Onset  . Drug abuse Maternal Uncle     Social History:  reports that she has never smoked. She does not have any smokeless tobacco history on file. She reports that she does not drink alcohol. Her drug history is not on file.  Additional Social History:  Alcohol / Drug Use Pain Medications: see PTA meds Prescriptions: see PTA meds Over the Counter: see PTA meds History of alcohol / drug use?: No history of alcohol / drug abuse  CIWA: CIWA-Ar BP: 109/63 mmHg Pulse Rate: 87 COWS:    PATIENT STRENGTHS: (choose at least two) Average or above average intelligence Capable of independent living Communication skills Motivation for treatment/growth Physical Health  Allergies: No Known Allergies  Home  Medications:  (Not in a hospital admission)  OB/GYN Status:  No LMP recorded.  General Assessment Data Location of Assessment: [facility] ED TTS Assessment: In system Is this a Tele or Face-to-Face Assessment?: Tele Assessment Is this an Initial Assessment or a Re-assessment for this encounter?: Initial Assessment Marital status: Separated Is patient pregnant?: No Pregnancy Status: No Living Arrangements: Other (Comment) (pastor) Can pt return to current living arrangement?: Yes Admission Status: Involuntary Is patient capable of signing voluntary admission?: Yes Referral Source: Self/Family/Friend Insurance type: Medicare  Medical Screening Exam ([place]) Medical Exam completed: Yes  Crisis Care Plan Living Arrangements: Other (Comment) (pastor) Name of Psychiatrist: Warden/ranger Name of Therapist: none  Education Status Is patient currently in school?: No  Risk to self with the past 6 months Suicidal Ideation: No Has patient been a risk to self within the past 6 months prior to admission? : No Suicidal Intent: No Has patient had any suicidal intent within the past 6 months prior to admission? : No Is patient at risk for suicide?: No Suicidal Plan?: No Has patient had any suicidal plan within the past 6 months prior to admission? : No Access to Means: No What has been your use of drugs/alcohol within the last 12 months?: pt denies Previous Attempts/Gestures: No How many times?: 0 Other Self Harm Risks: none Triggers for Past Attempts: Other (Comment) (no past attempts) Intentional Self Injurious Behavior: None Family Suicide History: Unknown Recent stressful life event(s): Other (Comment) (chest pain) Persecutory  voices/beliefs?: No Depression: No Substance abuse history and/or treatment for substance abuse?: No Suicide prevention information given to non-admitted patients: Not applicable  Risk to Others within the past 6 months Homicidal Ideation: No Does patient  have any lifetime risk of violence toward others beyond the six months prior to admission? : No Thoughts of Harm to Others: No Current Homicidal Intent: No Current Homicidal Plan: No Access to Homicidal Means: No History of harm to others?: No Assessment of Violence: None Noted Does patient have access to weapons?: No Criminal Charges Pending?: No Does patient have a court date: No Is patient on probation?: No  Psychosis Hallucinations: None noted Delusions: None noted  Mental Status Report Appearance/Hygiene: Unremarkable Eye Contact: Good Motor Activity: Unremarkable Speech: Logical/coherent Level of Consciousness: Irritable Mood: Angry, Irritable Affect: Appropriate to circumstance Anxiety Level: None Thought Processes: Coherent, Relevant Judgement: Partial Orientation: Person, Place, Time, Situation Obsessive Compulsive Thoughts/Behaviors: None  Cognitive Functioning Concentration: Normal Memory: Recent Intact, Remote Intact IQ: Average Insight: see judgement above Impulse Control: Good Appetite: Good Sleep: No Change Vegetative Symptoms: None  ADLScreening [facility] Assessment Services) Patient's cognitive ability adequate to safely complete daily activities?: Yes Patient able to express need for assistance with ADLs?: Yes Independently performs ADLs?: Yes (appropriate for developmental age)  Prior Inpatient Therapy Prior Inpatient Therapy: Yes Prior Therapy Dates: 2017 Prior Therapy Facilty/Provider(s): [facility] Reason for Treatment: delusions  Prior Outpatient Therapy Prior Outpatient Therapy: Yes Prior Therapy Dates: ongoing Prior Therapy Facilty/Provider(s): [facility] Reason for Treatment: schizoaffective Does patient have an ACCT team?: No Does patient have Intensive In-House Services?  : No Does patient have [facility] services? : Yes Does patient have P4CC services?: No  ADL Screening (condition at time of admission) Patient's cognitive ability adequate to  safely complete daily activities?: Yes Is the patient deaf or have difficulty hearing?: No Does the patient have difficulty seeing, even when wearing glasses/contacts?: No Does the patient have difficulty concentrating, remembering, or making decisions?: No Patient able to express need for assistance with ADLs?: Yes Does the patient have difficulty dressing or bathing?: No Independently performs ADLs?: Yes (appropriate for developmental age) Does the patient have difficulty walking or climbing stairs?: No Weakness of Legs: None Weakness of Arms/Hands: None  Home Assistive Devices/Equipment Home Assistive Devices/Equipment: None  Therapy Consults (therapy consults require a physician order) PT Evaluation Needed: No OT Evalulation Needed: No SLP Evaluation Needed: No Abuse/Neglect Assessment (Assessment to be complete while patient is alone) Physical Abuse: Denies Verbal Abuse: Denies Sexual Abuse: Denies Exploitation of patient/patient's resources: Denies Self-Neglect: Denies Values / Beliefs Cultural Requests During Hospitalization: None Spiritual Requests During Hospitalization: None Consults Spiritual Care Consult Needed: No Social Work Consult Needed: No Regulatory affairs officer (For Healthcare) Does patient have an advance directive?: No Would patient like information on creating an advanced directive?: No - patient declined information    Additional Information 1:1 In Past 12 Months?: No CIRT Risk: No Elopement Risk: No Does patient have medical clearance?: Yes     Disposition:  Disposition Initial Assessment Completed for this Encounter: Yes Disposition of Patient: Other dispositions (consulted with Agustina Caroli, PMHNP) Other disposition(s): Other (Comment), To current provider (can be d/c after medication recommendations from psych)  Rexene Edison 06/14/2015 10:11 AM

## 2015-06-14 NOTE — Discharge Instructions (Signed)
Continue the above medications as prescribed.  Follow-up with [facility] in the next week.   Paranoia Paranoia is a general mistrust of others. People with paranoia may feel as though people are "out to get them" and the world is against them without reason. They may be afraid of others or behave in an angry or hostile way toward others. HOME CARE INSTRUCTIONS Monitor your thoughts and mood for any changes. Take these steps to help with your condition:  Take over-the-counter and prescription medicines only as told by your health care provider.  Check with your health care provider before taking any herbs or supplements.  Keep all follow-up visits as told by your health care provider.This is important.  Maintain a healthy lifestyle:  Eat a healthy diet.  Exercise regularly.  Get plenty of sleep.  Avoid alcohol and drugs.  Learn ways to reduce stress and cope with stress, such as with yoga and meditation.  Talk about your feelings with family members or health care providers.  Make time for yourself to do things that you enjoy. SEEK MEDICAL CARE IF:  Medicines do not seem to be helping.  You feel extremely fearful and suspicious that something will harm you.  You feel hopeless and overwhelmed.  You feel like you cannot leave your house.  You have trouble taking care of yourself. SEEK IMMEDIATE MEDICAL CARE IF:  You have serious thoughts about hurting yourself or others.   This information is not intended to replace advice given to you by your health care provider. Make sure you discuss any questions you have with your health care provider.   Document Released: 12/26/2002 Document Revised: 05/09/2014 Document Reviewed: 10/30/2013 Elsevier Interactive Patient Education [employer].

## 2015-06-14 NOTE — ED Notes (Signed)
Per Dr. Stark Jock, pt can be discharged. Social worker filling out paperwork to rescind IVC papers.

## 2015-06-14 NOTE — ED Notes (Signed)
Spoke with Aldona Bar, Assessment Team at [facility]. Will place telepsych cart in room. Pt now awake enough for assessment and agreeable to such.

## 2015-06-28 ENCOUNTER — Encounter (HOSPITAL_COMMUNITY): Payer: Self-pay | Admitting: Nurse Practitioner

## 2015-06-28 ENCOUNTER — Emergency Department (HOSPITAL_COMMUNITY)
Admission: EM | Admit: 2015-06-28 | Discharge: 2015-06-29 | Disposition: A | Discharge status: Home / Self Care | Payer: Medicare Other | Attending: Emergency Medicine | Admitting: Emergency Medicine

## 2015-06-28 DIAGNOSIS — Z79899 Other long term (current) drug therapy: Secondary | ICD-10-CM | POA: Diagnosis not present

## 2015-06-28 DIAGNOSIS — Z7984 Long term (current) use of oral hypoglycemic drugs: Secondary | ICD-10-CM | POA: Insufficient documentation

## 2015-06-28 DIAGNOSIS — F3111 Bipolar disorder, current episode manic without psychotic features, mild: Secondary | ICD-10-CM | POA: Insufficient documentation

## 2015-06-28 DIAGNOSIS — R350 Frequency of micturition: Secondary | ICD-10-CM | POA: Insufficient documentation

## 2015-06-28 DIAGNOSIS — E119 Type 2 diabetes mellitus without complications: Secondary | ICD-10-CM | POA: Insufficient documentation

## 2015-06-28 DIAGNOSIS — F209 Schizophrenia, unspecified: Secondary | ICD-10-CM | POA: Insufficient documentation

## 2015-06-28 NOTE — ED Notes (Signed)
Pt states she wants Ambien, has declined vitals signs and anything other type of care stating "there are not necessary." Pt has an extensive psychiatric hx from chart review.

## 2015-06-29 DIAGNOSIS — R350 Frequency of micturition: Secondary | ICD-10-CM | POA: Diagnosis not present

## 2015-06-29 LAB — URINALYSIS, ROUTINE W REFLEX MICROSCOPIC
BILIRUBIN URINE: NEGATIVE
GLUCOSE, UA: NEGATIVE mg/dL
HGB URINE DIPSTICK: NEGATIVE
Ketones, ur: NEGATIVE mg/dL
Leukocytes, UA: NEGATIVE
Nitrite: NEGATIVE
PH: 6 (ref 5.0–8.0)
Protein, ur: NEGATIVE mg/dL
SPECIFIC GRAVITY, URINE: 1.005 (ref 1.005–1.030)

## 2015-06-29 NOTE — ED Provider Notes (Signed)
CSN: [id number]     Arrival date & time 06/28/15  2320 History   First MD Initiated Contact with Patient 06/29/15 [phone]     Chief Complaint  Patient presents with  . Wants Ambien    HPI  Ms. Murray-Taylor is a 35 year old female with past medical history of diabetes, schizophrenia and bipolar presenting with difficulty sleeping and increased urinary frequency. Patient had initially told triage nurse that she was here because she would like a prescription for Ambien. Upon my assessment, patient states that she is having trouble sleeping but does not want any prescriptions because "medications don't work for me like that". She states she is only here because she is having increased urinary frequency and she feels that she has a UTI. Denies associated abdominal pain, dysuria, hematuria, malodorous urine or vaginal discharge. Patient repeatedly denies other complaints and states she is only here to check her urine. Denies SI, HI, hallucinations. She does not have a PCP.   Past Medical History  Diagnosis Date  . Diabetes mellitus without complication ([place])   . Schizophrenia ([place])   . Bipolar affective disorder, currently manic, mild ([place])    History reviewed. No pertinent past surgical history. Family History  Problem Relation Age of Onset  . Drug abuse Maternal Uncle    Social History  Substance Use Topics  . Smoking status: Never Smoker   . Smokeless tobacco: None  . Alcohol Use: No   OB History    No data available     Review of Systems  All other systems reviewed and are negative.     Allergies  Review of patient's allergies indicates no known allergies.  Home Medications   Prior to Admission medications   Medication Sig Start Date End Date Taking? Authorizing Provider  ARIPiprazole (ABILIFY) 5 MG tablet Take one 5 mg tablet once daily at night 06/14/15  Yes Veryl Speak, MD  benztropine (COGENTIN) 0.5 MG tablet Take one tablet once daily at night. 06/14/15  Yes Veryl Speak,  MD  divalproex (DEPAKOTE ER) 500 MG 24 hr tablet Take 1-1/2 tablets (750) once daily at night. 06/14/15  Yes Veryl Speak, MD  hydrOXYzine (ATARAX/VISTARIL) 25 MG tablet Take 1 tablet every 6 hours as needed for anxiety 06/14/15  Yes Veryl Speak, MD  tolterodine (DETROL) 2 MG tablet Take 1 tablet (2 mg total) by mouth 2 (two) times daily. 06/14/15  Yes Veryl Speak, MD  zolpidem (AMBIEN) 5 MG tablet Take 1 tablet (5 mg total) by mouth at bedtime as needed for sleep. 06/14/15  Yes Veryl Speak, MD  docusate sodium (COLACE) 100 MG capsule Take 1 capsule (100 mg total) by mouth 2 (two) times daily. (May purchase from over the counter): For constipation Patient not taking: Reported on 05/31/2015 02/14/15   Encarnacion Slates, NP  fesoterodine (TOVIAZ) 4 MG TB24 tablet Take 1 tablet (4 mg total) by mouth daily. For over active bladder Patient not taking: Reported on 05/31/2015 02/14/15   Encarnacion Slates, NP  metFORMIN (GLUCOPHAGE) 500 MG tablet Take 1 tablet (500 mg total) by mouth daily with breakfast. For diabetes management Patient not taking: Reported on 05/31/2015 02/14/15   Encarnacion Slates, NP  nicotine (NICODERM CQ - DOSED IN MG/24 HOURS) 21 mg/24hr patch Place 1 patch (21 mg total) onto the skin daily. For smoking cessation Patient not taking: Reported on 05/31/2015 02/14/15   Encarnacion Slates, NP   LMP  (LMP Unknown) Physical Exam  Constitutional: She appears well-developed and well-nourished.  No distress.  HENT:  Head: Normocephalic and atraumatic.  Right Ear: External ear normal.  Left Ear: External ear normal.  Eyes: Conjunctivae are normal. Right eye exhibits no discharge. Left eye exhibits no discharge. No scleral icterus.  Neck: Normal range of motion.  Cardiovascular: Normal rate, regular rhythm and normal heart sounds.   Pulmonary/Chest: Effort normal and breath sounds normal.  Abdominal: Soft. She exhibits no distension. There is no tenderness.  Musculoskeletal: Normal range of motion.  Moves all  extremities spontaneously  Neurological: She is alert. Coordination normal.  Skin: Skin is warm and dry.  Psychiatric: Her speech is normal. Her affect is blunt. She is slowed.  Unusual affect. Speech is normal  Nursing note and vitals reviewed.   ED Course  Procedures (including critical care time) Labs Review Labs Reviewed  URINALYSIS, ROUTINE W REFLEX MICROSCOPIC (NOT AT [facility])    Imaging Review No results found. I have personally reviewed and evaluated these images and lab results as part of my medical decision-making.   EKG Interpretation None      MDM   Final diagnoses:  Urinary frequency   35 year old female presenting with increased urinary frequency. Pt initially told triage nurse she wanted ambien for sleep difficulty but states that she no longer wants to discuss her sleep and just wants to be checked for a UTI. Denies all psych complaints at this time. Afebrile and nontoxic appearing. Pt declines vitals stating they are unnecessary. Benign physical exam. Pt has a blunt affect. Pt is not exhibiting manic symptoms or hallucinations. Responds to questions appropriately though she is upset that we do not offer free cab fare. UA negative for infection. Pt again states she has no complaints. Discussed reasons to return to ED. Pt stable for discharge.    Josephina Gip, PA-C 06/29/15 1251  April Palumbo, MD 06/30/15 0110

## 2015-06-29 NOTE — Discharge Instructions (Signed)
Urinary Frequency °The number of times a normal person urinates depends upon how much liquid they take in and how much liquid they are losing. If the temperature is hot and there is high humidity, then the person will sweat more and usually breathe a little more frequently. These factors decrease the amount of frequency of urination that would be considered normal. °The amount you drink is easily determined, but the amount of fluid lost is sometimes more difficult to calculate.  °Fluid is lost in two ways: °· Sensible fluid loss is usually measured by the amount of urine that you get rid of. Losses of fluid can also occur with diarrhea. °· Insensible fluid loss is more difficult to measure. It is caused by evaporation. Insensible loss of fluid occurs through breathing and sweating. It usually ranges from a little less than a quart to a little more than a quart of fluid a day. °In normal temperatures and activity levels, the average person may urinate 4 to 7 times in a 24-hour period. Needing to urinate more often than that could indicate a problem. If one urinates 4 to 7 times in 24 hours and has large volumes each time, that could indicate a different problem from one who urinates 4 to 7 times a day and has small volumes. The time of urinating is also important. Most urinating should be done during the waking hours. Getting up at night to urinate frequently can indicate some problems. °CAUSES  °The bladder is the organ in your lower abdomen that holds urine. Like a balloon, it swells some as it fills up. Your nerves sense this and tell you it is time to head for the bathroom. There are a number of reasons that you might feel the need to urinate more often than usual. They include: °· Urinary tract infection. This is usually associated with other signs such as burning when you urinate. °· In men, problems with the prostate (a walnut-size gland that is located near the tube that carries urine out of your body). There  are two reasons why the prostate can cause an increased frequency of urination: °¨ An enlarged prostate that does not let the bladder empty well. If the bladder only half empties when you urinate, then it only has half the capacity to fill before you have to urinate again. °¨ The nerves in the bladder become more hypersensitive with an increased size of the prostate even if the bladder empties completely. °· Pregnancy. °· Obesity. Excess weight is more likely to cause a problem for women than for men. °· Bladder stones or other bladder problems. °· Caffeine. °· Alcohol. °· Medications. For example, drugs that help the body get rid of extra fluid (diuretics) increase urine production. Some other medicines must be taken with lots of fluids. °· Muscle or nerve weakness. This might be the result of a spinal cord injury, a stroke, multiple sclerosis, or Parkinson disease. °· Long-standing diabetes can decrease the sensation of the bladder. This loss of sensation makes it harder to sense the bladder needs to be emptied. Over a period of years, the bladder is stretched out by constant overfilling. This weakens the bladder muscles so that the bladder does not empty well and has less capacity to fill with new urine. °· Interstitial cystitis (also called painful bladder syndrome). This condition develops because the tissues that line the inside of the bladder are inflamed (inflammation is the body's way of reacting to injury or infection). It causes pain and frequent   urination. It occurs in women more often than in men. °DIAGNOSIS  °· To decide what might be causing your urinary frequency, your health care provider will probably: °¨ Ask about symptoms you have noticed. °¨ Ask about your overall health. This will include questions about any medications you are taking. °¨ Do a physical examination. °· Order some tests. These might include: °¨ A blood test to check for diabetes or other health issues that could be contributing  to the problem. °¨ Urine testing. This could measure the flow of urine and the pressure on the bladder. °¨ A test of your neurological system (the brain, spinal cord, and nerves). This is the system that senses the need to urinate. °¨ A bladder test to check whether it is emptying completely when you urinate. °¨ Cystoscopy. This test uses a thin tube with a tiny camera on it. It offers a look inside your urethra and bladder to see if there are problems. °¨ Imaging tests. You might be given a contrast dye and then asked to urinate. X-rays are taken to see how your bladder is working. °TREATMENT  °It is important for you to be evaluated to determine if the amount or frequency that you have is unusual or abnormal. If it is found to be abnormal, the cause should be determined and this can usually be found out easily. Depending upon the cause, treatment could include medication, stimulation of the nerves, or surgery. °There are not too many things that you can do as an individual to change your urinary frequency. It is important that you balance the amount of fluid intake needed to compensate for your activity and the temperature. Medical problems will be diagnosed and taken care of by your physician. There is no particular bladder training such as Kegel exercises that you can do to help urinary frequency. This is an exercise that is usually recommended for people who have leaking of urine when they laugh, cough, or sneeze. °HOME CARE INSTRUCTIONS  °· Take any medications your health care provider prescribed or suggested. Follow the directions carefully. °· Practice any lifestyle changes that are recommended. These might include: °¨ Drinking less fluid or drinking at different times of the day. If you need to urinate often during the night, for example, you may need to stop drinking fluids early in the evening. °¨ Cutting down on caffeine or alcohol. They both can make you need to urinate more often than normal. Caffeine  is found in coffee, tea, and sodas. °¨ Losing weight, if that is recommended. °· Keep a journal or a log. You might be asked to record how much you drink and when and where you feel the need to urinate. This will also help evaluate how well the treatment provided by your physician is working. °SEEK MEDICAL CARE IF:  °· Your need to urinate often gets worse. °· You feel increased pain or irritation when you urinate. °· You notice blood in your urine. °· You have questions about any medications that your health care provider recommended. °· You notice blood, pus, or swelling at the site of any test or treatment procedure. °· You develop a fever of more than 100.5°F (38.1°C). °SEEK IMMEDIATE MEDICAL CARE IF:  °You develop a fever of more than 102.0°F (38.9°C). °  °This information is not intended to replace advice given to you by your health care provider. Make sure you discuss any questions you have with your health care provider. °  °Document Released: 10/19/2008 Document Revised:   01/13/2014 Document Reviewed: 10/19/2008 °Elsevier Interactive Patient Education ©2016 [employer] ° °

## 2015-06-29 NOTE — ED Notes (Signed)
Called  No response from lobby 

## 2015-06-29 NOTE — ED Notes (Signed)
Pt given discharge paper and RN proceeded to review them with her.  Pt sts "I don't need to hear about that all I need is [place]."  RN explained that PA did not write any prescriptions.  And then pt took the discharge papers and tore them up and threw them away.  Pt refused vitals and refused to sign for discharge.  Pt requested a cab voucher and RN reports we do not provided such,  Pt told me I was lying and asked for bus fare.  RN gave pt a bus voucher, and pt left.

## 2015-07-21 ENCOUNTER — Emergency Department (HOSPITAL_COMMUNITY)
Admission: EM | Admit: 2015-07-21 | Discharge: 2015-07-21 | Disposition: A | Discharge status: Other Acute Inpatient Hospital | Payer: Medicare Other | Attending: Emergency Medicine | Admitting: Emergency Medicine

## 2015-07-21 ENCOUNTER — Encounter (HOSPITAL_COMMUNITY): Payer: Self-pay | Admitting: *Deleted

## 2015-07-21 ENCOUNTER — Inpatient Hospital Stay (HOSPITAL_COMMUNITY)
Admission: AD | Admit: 2015-07-21 | Discharge: 2015-08-07 | DRG: 885 | Disposition: A | Discharge status: Home / Self Care | Payer: Medicare Other | Attending: Psychiatry | Admitting: Psychiatry

## 2015-07-21 ENCOUNTER — Encounter (HOSPITAL_COMMUNITY): Payer: Self-pay | Admitting: Oncology

## 2015-07-21 DIAGNOSIS — Z9114 Patient's other noncompliance with medication regimen: Secondary | ICD-10-CM | POA: Insufficient documentation

## 2015-07-21 DIAGNOSIS — Z046 Encounter for general psychiatric examination, requested by authority: Secondary | ICD-10-CM | POA: Diagnosis present

## 2015-07-21 DIAGNOSIS — F29 Unspecified psychosis not due to a substance or known physiological condition: Secondary | ICD-10-CM | POA: Insufficient documentation

## 2015-07-21 DIAGNOSIS — F411 Generalized anxiety disorder: Secondary | ICD-10-CM | POA: Insufficient documentation

## 2015-07-21 DIAGNOSIS — E119 Type 2 diabetes mellitus without complications: Secondary | ICD-10-CM | POA: Diagnosis present

## 2015-07-21 DIAGNOSIS — N3281 Overactive bladder: Secondary | ICD-10-CM | POA: Diagnosis present

## 2015-07-21 DIAGNOSIS — Z7984 Long term (current) use of oral hypoglycemic drugs: Secondary | ICD-10-CM

## 2015-07-21 DIAGNOSIS — G4700 Insomnia, unspecified: Secondary | ICD-10-CM | POA: Insufficient documentation

## 2015-07-21 DIAGNOSIS — Z79899 Other long term (current) drug therapy: Secondary | ICD-10-CM | POA: Diagnosis not present

## 2015-07-21 DIAGNOSIS — K219 Gastro-esophageal reflux disease without esophagitis: Secondary | ICD-10-CM | POA: Diagnosis present

## 2015-07-21 DIAGNOSIS — F3111 Bipolar disorder, current episode manic without psychotic features, mild: Secondary | ICD-10-CM | POA: Diagnosis not present

## 2015-07-21 DIAGNOSIS — F250 Schizoaffective disorder, bipolar type: Secondary | ICD-10-CM | POA: Insufficient documentation

## 2015-07-21 LAB — URINE MICROSCOPIC-ADD ON: RBC / HPF: NONE SEEN RBC/hpf (ref 0–5)

## 2015-07-21 LAB — RAPID URINE DRUG SCREEN, HOSP PERFORMED
Amphetamines: NOT DETECTED
Barbiturates: NOT DETECTED
Benzodiazepines: NOT DETECTED
Cocaine: NOT DETECTED
Opiates: NOT DETECTED
Tetrahydrocannabinol: NOT DETECTED

## 2015-07-21 LAB — CBC
HCT: 39 % (ref 36.0–46.0)
Hemoglobin: 13.2 g/dL (ref 12.0–15.0)
MCH: 31.5 pg (ref 26.0–34.0)
MCHC: 33.8 g/dL (ref 30.0–36.0)
MCV: 93.1 fL (ref 78.0–100.0)
Platelets: 197 10*3/uL (ref 150–400)
RBC: 4.19 MIL/uL (ref 3.87–5.11)
RDW: 13.5 % (ref 11.5–15.5)
WBC: 6.5 10*3/uL (ref 4.0–10.5)

## 2015-07-21 LAB — COMPREHENSIVE METABOLIC PANEL
ALT: 14 U/L (ref 14–54)
AST: 20 U/L (ref 15–41)
Albumin: 4.9 g/dL (ref 3.5–5.0)
Alkaline Phosphatase: 42 U/L (ref 38–126)
Anion gap: 8 (ref 5–15)
BUN: 13 mg/dL (ref 6–20)
CO2: 26 mmol/L (ref 22–32)
Calcium: 10.2 mg/dL (ref 8.9–10.3)
Chloride: 103 mmol/L (ref 101–111)
Creatinine, Ser: 0.79 mg/dL (ref 0.44–1.00)
GFR calc Af Amer: >60 mL/min (ref >60)
GFR calc non Af Amer: >60 mL/min (ref >60)
Glucose, Bld: 106 mg/dL — ABNORMAL HIGH (ref 65–99)
Potassium: 4.3 mmol/L (ref 3.5–5.1)
Sodium: 137 mmol/L (ref 135–145)
Total Bilirubin: 0.7 mg/dL (ref 0.3–1.2)
Total Protein: 8.6 g/dL — ABNORMAL HIGH (ref 6.5–8.1)

## 2015-07-21 LAB — URINALYSIS, ROUTINE W REFLEX MICROSCOPIC
Bilirubin Urine: NEGATIVE
Glucose, UA: NEGATIVE mg/dL
Hgb urine dipstick: NEGATIVE
Ketones, ur: NEGATIVE mg/dL
Nitrite: NEGATIVE
Protein, ur: NEGATIVE mg/dL
Specific Gravity, Urine: 1.019 (ref 1.005–1.030)
pH: 5.5 (ref 5.0–8.0)

## 2015-07-21 LAB — ACETAMINOPHEN LEVEL

## 2015-07-21 LAB — SALICYLATE LEVEL

## 2015-07-21 LAB — POC URINE PREG, ED: Preg Test, Ur: NEGATIVE

## 2015-07-21 LAB — ETHANOL: Alcohol, Ethyl (B): <5 mg/dL (ref <5)

## 2015-07-21 MED ORDER — ZIPRASIDONE HCL 80 MG PO CAPS
160.0000 mg | ORAL_CAPSULE | Freq: Every day | ORAL | Status: DC
  Administered 2015-07-22: 160 mg via ORAL
  Filled 2015-07-21 (×2): qty 2
  Filled 2015-07-21: qty 4
  Filled 2015-07-21: qty 2

## 2015-07-21 MED ORDER — DIVALPROEX SODIUM ER 500 MG PO TB24: 1000.0000 mg | ORAL_TABLET | Freq: Every day | ORAL | Status: DC

## 2015-07-21 MED ORDER — METFORMIN HCL 500 MG PO TABS
500.0000 mg | ORAL_TABLET | Freq: Every day | ORAL | Status: DC
  Filled 2015-07-21: qty 1

## 2015-07-21 MED ORDER — ALUM & MAG HYDROXIDE-SIMETH 200-200-20 MG/5ML PO SUSP
30.0000 mL | ORAL | Status: DC | PRN
  Administered 2015-07-28 – 2015-07-30 (×2): 30 mL via ORAL
  Filled 2015-07-21 (×2): qty 30

## 2015-07-21 MED ORDER — METFORMIN HCL 500 MG PO TABS
500.0000 mg | ORAL_TABLET | Freq: Every day | ORAL | Status: DC
  Administered 2015-07-22 – 2015-08-07 (×15): 500 mg via ORAL
  Filled 2015-07-21 (×5): qty 1
  Filled 2015-07-21: qty 7
  Filled 2015-07-21 (×14): qty 1

## 2015-07-21 MED ORDER — TRAZODONE HCL 100 MG PO TABS: 100.0000 mg | ORAL_TABLET | Freq: Every evening | ORAL | Status: DC | PRN

## 2015-07-21 MED ORDER — ZIPRASIDONE HCL 20 MG PO CAPS: 160.0000 mg | ORAL_CAPSULE | Freq: Every day | ORAL | Status: DC

## 2015-07-21 MED ORDER — TRAZODONE HCL 100 MG PO TABS: 100.0000 mg | ORAL_TABLET | Freq: Every day | ORAL | Status: DC

## 2015-07-21 MED ORDER — ZOLPIDEM TARTRATE 5 MG PO TABS: 5.0000 mg | ORAL_TABLET | Freq: Every evening | ORAL | Status: DC | PRN

## 2015-07-21 MED ORDER — MAGNESIUM HYDROXIDE 400 MG/5ML PO SUSP
30.0000 mL | Freq: Every day | ORAL | Status: DC | PRN
  Administered 2015-07-27: 30 mL via ORAL
  Filled 2015-07-21: qty 30

## 2015-07-21 MED ORDER — DIVALPROEX SODIUM ER 500 MG PO TB24
1000.0000 mg | ORAL_TABLET | Freq: Every day | ORAL | Status: DC
  Administered 2015-07-21 – 2015-08-01 (×12): 1000 mg via ORAL
  Administered 2015-08-02: 500 mg via ORAL
  Administered 2015-08-03 – 2015-08-06 (×4): 1000 mg via ORAL
  Filled 2015-07-21 (×12): qty 2
  Filled 2015-07-21: qty 10
  Filled 2015-07-21 (×5): qty 2
  Filled 2015-07-21: qty 14
  Filled 2015-07-21 (×3): qty 2

## 2015-07-21 MED ORDER — BENZTROPINE MESYLATE 0.5 MG PO TABS
0.5000 mg | ORAL_TABLET | Freq: Every day | ORAL | Status: DC
  Administered 2015-07-22 – 2015-07-24 (×3): 0.5 mg via ORAL
  Filled 2015-07-21 (×4): qty 1

## 2015-07-21 MED ORDER — ARIPIPRAZOLE 5 MG PO TABS
5.0000 mg | ORAL_TABLET | Freq: Every day | ORAL | Status: DC
  Administered 2015-07-21: 5 mg via ORAL
  Filled 2015-07-21: qty 1

## 2015-07-21 MED ORDER — PALIPERIDONE ER 6 MG PO TB24
6.0000 mg | ORAL_TABLET | Freq: Every day | ORAL | Status: DC
  Filled 2015-07-21 (×2): qty 1

## 2015-07-21 MED ORDER — BENZTROPINE MESYLATE 1 MG PO TABS
0.5000 mg | ORAL_TABLET | Freq: Every day | ORAL | Status: DC
  Administered 2015-07-21: 0.5 mg via ORAL
  Filled 2015-07-21: qty 1

## 2015-07-21 MED ORDER — PALIPERIDONE ER 6 MG PO TB24
6.0000 mg | ORAL_TABLET | Freq: Every day | ORAL | Status: DC
  Administered 2015-07-21: 6 mg via ORAL
  Filled 2015-07-21: qty 1

## 2015-07-21 MED ORDER — OXYBUTYNIN CHLORIDE ER 5 MG PO TB24
5.0000 mg | ORAL_TABLET | Freq: Every day | ORAL | Status: DC
  Administered 2015-07-21 – 2015-07-31 (×11): 5 mg via ORAL
  Filled 2015-07-21 (×16): qty 1

## 2015-07-21 MED ORDER — TRAZODONE HCL 100 MG PO TABS
100.0000 mg | ORAL_TABLET | Freq: Every day | ORAL | Status: DC
  Administered 2015-07-21 – 2015-07-26 (×6): 100 mg via ORAL
  Filled 2015-07-21 (×8): qty 1

## 2015-07-21 MED ORDER — HYDROXYZINE HCL 25 MG PO TABS
25.0000 mg | ORAL_TABLET | Freq: Three times a day (TID) | ORAL | Status: DC | PRN
  Administered 2015-07-21 – 2015-07-22 (×2): 25 mg via ORAL
  Filled 2015-07-21: qty 10
  Filled 2015-07-21: qty 1
  Filled 2015-07-21: qty 10
  Filled 2015-07-21: qty 1

## 2015-07-21 MED ORDER — ACETAMINOPHEN 325 MG PO TABS
650.0000 mg | ORAL_TABLET | Freq: Four times a day (QID) | ORAL | Status: DC | PRN
  Administered 2015-07-22 – 2015-08-03 (×3): 650 mg via ORAL
  Filled 2015-07-21 (×4): qty 2

## 2015-07-21 MED ORDER — OXYBUTYNIN CHLORIDE ER 5 MG PO TB24: 5.0000 mg | ORAL_TABLET | Freq: Every day | ORAL | Status: DC

## 2015-07-21 MED ORDER — HYDROXYZINE HCL 25 MG PO TABS: 25.0000 mg | ORAL_TABLET | Freq: Three times a day (TID) | ORAL | Status: DC | PRN

## 2015-07-21 NOTE — ED Provider Notes (Signed)
CSN: [id number]     Arrival date & time 07/21/15  0038 History  By signing my name below, I, [name] [name], attest that this documentation has been prepared under the direction and in the presence of Abigail Butts, [place]. Electronically Signed: [name], ED Scribe. 07/21/2015. 5:47 AM.   Chief Complaint  Patient presents with  . Medical Clearance    The history is provided by the patient, medical records and the police. No language interpreter was used.   LEVEL 5 CAVEAT DUE TO PSYCHOSIS  HPI Comments: [name] [name] is a 35 y.o. female who presents to the Emergency Department seeking help with managing her psychiatric medications. Pt states she is from [place] and came to [place] and has not been able to take her medications. Pt states she is pregnant, bipolar, paranoia schizophrenia and has DM. Pt has not received any help since she has gotten to [place]. Pt states she has been here at least 3 months. Pt is unaware when she became pregnant. Pt states there is "someone who sits out back behind her shed who is affiliated with the [employer] watching her". Pt states she is being "poisoned through the water." Pt states she is "being filmed bathing." Pt states she does not feel suicidal today. Pt denies hearing voices. Pt states that besides her medications, there is nothing she is requiring help for today. Pt states that if she needs to be healthy enough to see a psychiatrist, then she "doesn't need to be here." Pt repeatedly denies physical exam of heart and lungs, even after being informed that it is her only way to receive psychiatric help. Per Police she is trying to get her medicine regulated but won't take it.   Past Medical History  Diagnosis Date  . Diabetes mellitus without complication ([place])   . Schizophrenia ([place])   . Bipolar affective disorder, currently manic, mild ([place])    History reviewed. No pertinent past surgical history. Family History   Problem Relation Age of Onset  . Drug abuse Maternal Uncle    Social History  Substance Use Topics  . Smoking status: Never Smoker   . Smokeless tobacco: None  . Alcohol Use: No   OB History    No data available     Review of Systems  Unable to perform ROS: Psychiatric disorder      Allergies  Review of patient's allergies indicates no known allergies.  Home Medications   Prior to Admission medications   Medication Sig Start Date End Date Taking? Authorizing Provider  ARIPiprazole (ABILIFY) 5 MG tablet Take one 5 mg tablet once daily at night Patient not taking: Reported on 07/21/2015 06/14/15   Veryl Speak, MD  benztropine (COGENTIN) 0.5 MG tablet Take one tablet once daily at night. Patient not taking: Reported on 07/21/2015 06/14/15   Veryl Speak, MD  divalproex (DEPAKOTE ER) 500 MG 24 hr tablet Take 1-1/2 tablets (750) once daily at night. Patient taking differently: Take 1,000 mg by mouth at bedtime.  06/14/15   Veryl Speak, MD  docusate sodium (COLACE) 100 MG capsule Take 1 capsule (100 mg total) by mouth 2 (two) times daily. (May purchase from over the counter): For constipation Patient not taking: Reported on 05/31/2015 02/14/15   Encarnacion Slates, NP  fesoterodine (TOVIAZ) 4 MG TB24 tablet Take 1 tablet (4 mg total) by mouth daily. For over active bladder Patient not taking: Reported on 05/31/2015 02/14/15   Encarnacion Slates, NP  hydrOXYzine (ATARAX/VISTARIL) 25 MG  tablet Take 1 tablet every 6 hours as needed for anxiety Patient not taking: Reported on 07/21/2015 06/14/15   Veryl Speak, MD  metFORMIN (GLUCOPHAGE) 500 MG tablet Take 1 tablet (500 mg total) by mouth daily with breakfast. For diabetes management Patient not taking: Reported on 05/31/2015 02/14/15   Encarnacion Slates, NP  nicotine (NICODERM CQ - DOSED IN MG/24 HOURS) 21 mg/24hr patch Place 1 patch (21 mg total) onto the skin daily. For smoking cessation Patient not taking: Reported on 05/31/2015 02/14/15   Encarnacion Slates, NP   paliperidone (INVEGA) 6 MG 24 hr tablet Take 6 mg by mouth daily at 12 noon.    Historical Provider, MD  tolterodine (DETROL) 2 MG tablet Take 1 tablet (2 mg total) by mouth 2 (two) times daily. Patient not taking: Reported on 07/21/2015 06/14/15   Veryl Speak, MD  traZODone (DESYREL) 100 MG tablet Take 100 mg by mouth at bedtime.    Historical Provider, MD  ziprasidone (GEODON) 80 MG capsule Take 160 mg by mouth at bedtime.    Historical Provider, MD  zolpidem (AMBIEN) 5 MG tablet Take 1 tablet (5 mg total) by mouth at bedtime as needed for sleep. Patient not taking: Reported on 07/21/2015 06/14/15   Veryl Speak, MD   BP 157/119 mmHg  Pulse 98  Temp(Src) 97.9 F (36.6 C) (Oral)  Resp 20  SpO2 100%  LMP  (LMP Unknown) Physical Exam  Constitutional: She appears well-developed and well-nourished. No distress.  Awake, alert, nontoxic appearance She is agitated, yelling at me consistently and refusing exam  HENT:  Head: Normocephalic and atraumatic.  Eyes: Conjunctivae are normal. No scleral icterus.  Neck: Normal range of motion.  Pulmonary/Chest: Effort normal. No respiratory distress.  Equal chest expansion  Abdominal: Soft. She exhibits no distension.  Musculoskeletal: Normal range of motion. She exhibits no edema.  Neurological: She is alert.  Speech is clear  Moves extremities without ataxia  Skin: She is not diaphoretic.  Psychiatric: Her affect is labile. Her speech is not rapid and/or pressured. She is agitated, aggressive and actively hallucinating. Thought content is paranoid and delusional. She expresses no homicidal and no suicidal ideation. She expresses no suicidal plans and no homicidal plans.  Nursing note and vitals reviewed.   ED Course  Procedures (including critical care time)  DIAGNOSTIC STUDIES: Oxygen Saturation is 100% on RA, normal by my interpretation.    COORDINATION OF CARE: 1:31 AM Discussed treatment plan with pt at bedside which includes labs and  TTS evaluation.   Labs Review Labs Reviewed  COMPREHENSIVE METABOLIC PANEL - Abnormal; Notable for the following:    Glucose, Bld 106 (*)    Total Protein 8.6 (*)    All other components within normal limits  ACETAMINOPHEN LEVEL - Abnormal; Notable for the following:    Acetaminophen (Tylenol), Serum <10 (*)    All other components within normal limits  URINALYSIS, ROUTINE W REFLEX MICROSCOPIC (NOT AT [facility]) - Abnormal; Notable for the following:    Leukocytes, UA SMALL (*)    All other components within normal limits  URINE MICROSCOPIC-ADD ON - Abnormal; Notable for the following:    Squamous Epithelial / LPF 6-30 (*)    Bacteria, UA FEW (*)    All other components within normal limits  ETHANOL  SALICYLATE LEVEL  CBC  URINE RAPID DRUG SCREEN, HOSP PERFORMED  POC URINE PREG, ED      MDM   Final diagnoses:  Psychosis, unspecified psychosis type  Schizoaffective  disorder, bipolar type ([place])  Non compliance w medication regimen   Venisha Gillion presents requesting help for her mental illness. She is clearly psychotic and hallucinating. She is agitated, yelling and screaming in the room. She reports that she is pregnant however has a negative pregnancy test. Labs are reassuring. [employer] at bedside reports that patient has acted this way in the past.  She is completely uncooperative with evaluation and medical clearance.  Due to her psychosis she is a danger herself and unable to care for herself. IVC paperwork filed.  Patient is without elevated glucose or anion gap today. No evidence of DKA.   Pt is medically cleared.    The patient was discussed with and seen by Dr. Tamera Punt who agrees with the treatment plan.  BP 120/83 mmHg  Pulse 82  Temp(Src) 97.9 F (36.6 C) (Oral)  Resp 18  SpO2 99%  LMP  (LMP Unknown)  I personally performed the services described in this documentation, which was scribed in my presence. The recorded information has been reviewed and is  accurate.   Jarrett Soho Daviana Haymaker, PA-C 07/21/15 [place], MD 07/21/15 [phone]

## 2015-07-21 NOTE — Progress Notes (Signed)
Patient ID: [name] [name], female   DOB: Aug 04, 1980, 35 y.o.   MRN: [id number] Per State regulations 482.30 this chart was reviewed for medical necessity with respect to the patient's admission/duration of stay.    Next review date: 07/25/15  Debarah Crape, BSN, RN-BC  Case Manager

## 2015-07-21 NOTE — ED Notes (Signed)
Pt refused vital signs. RN notified.

## 2015-07-21 NOTE — ED Notes (Signed)
Per [employer] officer, pt phoned [employer] to obtain a ride to the hospital.  Pt is openly hostile in triage.  States, "The government is poisoning me, God [id number] me."  Pt also stated, "Just commit me."  Pt appears to be escalating at this time.  [employer] standing by the door.  Pt continues to say she needs help however will not elaborate.

## 2015-07-21 NOTE — BH Assessment (Addendum)
Tele Assessment Note   [name] [name] is an 35 y.o. female presented to [facility] under IVC with [employer]. Per IVC paperwork, pt requested help with her mental illness and voiced thoughts of people watching her and trying to poison her. Pt denied depression and anxiety but reported A/V hallucinations. Pt refused to answer any further questions. Dr. Darleene Cleaver and Waylan Boga will round on the patient.    Diagnosis: F25.9 Schizoaffective Disorder  Past Medical History:  Past Medical History  Diagnosis Date  . Diabetes mellitus without complication ([place])   . Schizophrenia ([place])   . Bipolar affective disorder, currently manic, mild ([place])     History reviewed. No pertinent past surgical history.  Family History:  Family History  Problem Relation Age of Onset  . Drug abuse Maternal Uncle     Social History:  reports that she has never smoked. She does not have any smokeless tobacco history on file. She reports that she does not drink alcohol. Her drug history is not on file.  Additional Social History:  Alcohol / Drug Use Pain Medications: pt refused  Prescriptions: pt refused Over the Counter: pt refused  CIWA: CIWA-Ar BP: 120/83 mmHg Pulse Rate: 82 COWS:    PATIENT STRENGTHS: (choose at least two) Communication skills Physical Health  Allergies: No Known Allergies  Home Medications:  (Not in a hospital admission)  OB/GYN Status:  No LMP recorded (lmp unknown).  General Assessment Data Location of Assessment: [name] Assessment: In system Is this a Tele or Face-to-Face Assessment?: Face-to-Face Is this an Initial Assessment or a Re-assessment for this encounter?: Initial Assessment Marital status: Other (comment) (pt refused to answer) Is patient pregnant?: Unknown Pregnancy Status: Unknown Living Arrangements: Other (Comment) (pt refused to answer) Can pt return to current living arrangement?:  (pt refused to answer) Admission Status: Involuntary Is patient capable  of signing voluntary admission?: No Referral Source: Other ([employer]) Insurance type: medicare  Medical Screening Exam ([place]) Medical Exam completed: Yes  Crisis Care Plan Living Arrangements: Other (Comment) (pt refused to answer) Name of Psychiatrist:  (pt refused to answer) Name of Therapist:  (pt refused to answer)  Education Status Is patient currently in school?:  (pt refused to answer)  Risk to self with the past 6 months Suicidal Ideation:  (pt refused to answer) Has patient been a risk to self within the past 6 months prior to admission? :  (pt refused to answer) Suicidal Intent:  (pt refused to anser) Has patient had any suicidal intent within the past 6 months prior to admission? :  (pt refused to answer) Is patient at risk for suicide?:  (pt refused to answer) Suicidal Plan?:  (pt refused to answer) Has patient had any suicidal plan within the past 6 months prior to admission? :  (pt refused to answer) Access to Means:  (pt refused to answer) What has been your use of drugs/alcohol within the last 12 months?:  (pt refused to answer) Previous Attempts/Gestures:  (pt refused to answer) How many times?:  (pt refused to answer) Triggers for Past Attempts: Unknown Intentional Self Injurious Behavior:  (pt refused to answer) Family Suicide History: Unknown Recent stressful life event(s):  (pt refused to answer) Persecutory voices/beliefs?: Yes Depression: No Substance abuse history and/or treatment for substance abuse?:  (pt refused to answer) Suicide prevention information given to non-admitted patients: Not applicable  Risk to Others within the past 6 months Homicidal Ideation:  (pt refused to answer) Does patient have any lifetime risk  of violence toward others beyond the six months prior to admission? :  (pt refused to answer) Thoughts of Harm to Others:  (pt refused to answer) Current Homicidal Intent:  (pt refused to answer) Current Homicidal Plan:  (pt  refused to answer) Access to Homicidal Means:  (pt refused to answer) Identified Victim:  (pt refused to answer) History of harm to others?:  (pt refused to answer) Assessment of Violence:  (pt refused to answer) Violent Behavior Description:  (pt refused to answer) Does patient have access to weapons?:  (pt refused to answer) Criminal Charges Pending?:  (pt refused to answer) Does patient have a court date:  (pt refused to answer) Is patient on probation?:  (pt refused to answer)  Psychosis Hallucinations: Auditory, Visual Delusions: Persecutory (per IVC)  Mental Status Report Appearance/Hygiene: Unremarkable Eye Contact: Fair Motor Activity: Freedom of movement Speech: Logical/coherent Level of Consciousness: Irritable Mood: Angry Affect: Appropriate to circumstance Anxiety Level: None Thought Processes: Coherent, Relevant Judgement: Impaired Orientation: Unable to assess Obsessive Compulsive Thoughts/Behaviors: Unable to Assess  Cognitive Functioning Concentration: Unable to Assess Memory: Unable to Assess IQ: Average Insight: Poor Impulse Control: Poor Appetite:  (pt refused to answer) Weight Loss:  (pt refused to answer) Weight Gain:  (pt refused to answer) Sleep: Unable to Assess Vegetative Symptoms: Unable to Assess  ADLScreening [facility] Assessment Services) Patient's cognitive ability adequate to safely complete daily activities?:  (pt refused) Patient able to express need for assistance with ADLs?:  (pt refused) Independently performs ADLs?:  (pt refused)  Prior Inpatient Therapy Prior Inpatient Therapy: Yes Prior Therapy Dates: 2017 Prior Therapy Facilty/Provider(s): [facility] Reason for Treatment: delusions  Prior Outpatient Therapy Prior Outpatient Therapy:  (pt refused to answer) Prior Therapy Dates:  (pt refused to answer) Prior Therapy Facilty/Provider(s):  (pt refused to answer) Reason for Treatment:  (pt refused to answer) Does patient have an ACCT  team?: Unknown Does patient have Intensive In-House Services?  : Unknown Does patient have [facility] services? : Unknown Does patient have P4CC services?: Unknown  ADL Screening (condition at time of admission) Patient's cognitive ability adequate to safely complete daily activities?:  (pt refused) Is the patient deaf or have difficulty hearing?:  (pt refused) Does the patient have difficulty seeing, even when wearing glasses/contacts?:  (pt refused) Does the patient have difficulty concentrating, remembering, or making decisions?:  (pt refused) Patient able to express need for assistance with ADLs?:  (pt refused) Does the patient have difficulty dressing or bathing?:  (pt refused) Independently performs ADLs?:  (pt refused)       Abuse/Neglect Assessment (Assessment to be complete while patient is alone) Physical Abuse:  (pt refused to answer) Verbal Abuse:  (pt refused to answer) Sexual Abuse:  (pt refused to answer) Exploitation of patient/patient's resources:  (pt refused to answer) Self-Neglect:  (pt refused to answer)     Advance Directives (For Healthcare) Does patient have an advance directive?:  (pt refused) Would patient like information on creating an advanced directive?:  (pt refused)    Additional Information 1:1 In Past 12 Months?: No CIRT Risk: No Elopement Risk: No Does patient have medical clearance?: Yes     Disposition:  Disposition Initial Assessment Completed for this Encounter: Yes  Ruben Reason, MA, Alesia Banda, [facility]   07/21/2015 10:04 AM

## 2015-07-21 NOTE — Progress Notes (Signed)
Nursing Shift Note- Received pt in room 503-2, Pt apologize for her difficult behavior earlier but state's she's suffering from depression and the ' Lord" told her to accept help. " I've been very lazy lately not taking care of my sugar or myself". Pt is very cautious and guarded when speaking. Pt showered and ate dinner.

## 2015-07-21 NOTE — Tx Team (Signed)
Initial Interdisciplinary Treatment Plan   PATIENT STRESSORS: Paranoia   PATIENT STRENGTHS: Average or above average intelligence Communication skills Physical Health   PROBLEM LIST: Problem List/Patient Goals Date to be addressed Date deferred Reason deferred Estimated date of resolution  Psychosis 07/21/2015  07/21/2015   D/C  Depression 07/21/2015  07/21/2015   D/C  "I don't need to be here" 07/21/2015  07/21/2015   D/C  "Getting out of here" 07/21/2015  07/21/2015   D/C                                 DISCHARGE CRITERIA:  Ability to meet basic life and health needs Adequate post-discharge living arrangements Improved stabilization in mood, thinking, and/or behavior Motivation to continue treatment in a less acute level of care Need for constant or close observation no longer present Verbal commitment to aftercare and medication compliance  PRELIMINARY DISCHARGE PLAN: Outpatient therapy Return to previous living arrangement  PATIENT/FAMIILY INVOLVEMENT: This treatment plan has been presented to and reviewed with the patient, [name] [name].  The patient and family have been given the opportunity to ask questions and make suggestions.  Donne Hazel P 07/21/2015, 8:47 PM

## 2015-07-21 NOTE — Progress Notes (Signed)
Admission Note:  35 year old female who presents IVC, in no acute distress, for the treatment of psychosis.  Patient states "People are out to get me", "[employer] poisoned the water", "[employer] have people following me around, they installed cameras when they installed the camera's, they watch me bathe". During admission, patient was refusing to have her skin searched and stated "You all have no respect for my religion".  Patient repeatedly stated "You are not looking at my body. [employer] violates me enough. I will leave here before I let you look at my body".  Patient eventually agreed to have her skin searched.  Patient appears angry and irritable with admission process. Patient midway through the admission, patient refused to respond to any admission question stating "This is taking too long. I'm not going to answer anymore questions".  While at [facility], patient would like to work on "Getting out of here" and keeps insisting "I don't need to be here".  Skin was assessed and found to be clear of any abnormal marks.  Patient searched and no contraband found, POC and unit policies explained and understanding verbalized. Consents obtained. Patient had no additional questions or concerns.

## 2015-07-21 NOTE — ED Notes (Signed)
Patient transferred to [facility].  Left the unit ambulatory with [employer].  All belongings given to the officers.

## 2015-07-21 NOTE — Progress Notes (Signed)
Did not attend. 

## 2015-07-21 NOTE — ED Notes (Signed)
Pt received and went directly to bed. Pt had no interaction with writer, will continue Q 15 min rounds and camera monitoring for safety. Pt has no complaints at this time, and is in no visible distress.

## 2015-07-22 ENCOUNTER — Other Ambulatory Visit: Payer: Self-pay

## 2015-07-22 ENCOUNTER — Encounter (HOSPITAL_COMMUNITY): Payer: Self-pay | Admitting: Registered Nurse

## 2015-07-22 DIAGNOSIS — N3281 Overactive bladder: Secondary | ICD-10-CM | POA: Insufficient documentation

## 2015-07-22 DIAGNOSIS — G4700 Insomnia, unspecified: Secondary | ICD-10-CM | POA: Insufficient documentation

## 2015-07-22 DIAGNOSIS — F411 Generalized anxiety disorder: Secondary | ICD-10-CM | POA: Insufficient documentation

## 2015-07-22 DIAGNOSIS — F250 Schizoaffective disorder, bipolar type: Principal | ICD-10-CM

## 2015-07-22 LAB — GLUCOSE, CAPILLARY: GLUCOSE-CAPILLARY: 135 mg/dL — AB (ref 65–99)

## 2015-07-22 MED ORDER — ZIPRASIDONE HCL 40 MG PO CAPS
40.0000 mg | ORAL_CAPSULE | Freq: Two times a day (BID) | ORAL | Status: DC
  Administered 2015-07-22 – 2015-07-24 (×4): 40 mg via ORAL
  Filled 2015-07-22 (×7): qty 1

## 2015-07-22 NOTE — H&P (Signed)
Psychiatric Admission Assessment Adult  Patient Identification: [name] [name] MRN:  [id number] Date of Evaluation:  07/22/2015 Chief Complaint:  Schizoaffective Principal Diagnosis: Schizoaffective disorder, bipolar type ([place]) Diagnosis:   Patient Active Problem List   Diagnosis Date Noted  . Diabetes mellitus ([place]) [E11.9] 02/08/2015  . Schizoaffective disorder, bipolar type ([place]) [F25.0] 01/28/2015  . Non compliance w medication regimen [Z91.14]    History of Present Illness: [name] [name] 35 y.o. female patient who was seen by this provider and chart reviewed 07/22/2015.  On evaluation:  [name] [name] reports "I called the cops to bring me to the hospital because something told me to call.  I've been going through a lot lately."  Patient state that she is living with a church family that she feels is against her.  Patient also reported that she had prophets that would give her warnings.  States that the prophets were in the flesh and in voices that only she could hear. "They protect me and direct me.  They told me that I'm being prosecuted by my church family."  Patient states that she is from [place]. And moved here with her church family and thing have been going down hill since.  States that she was told by some one in the church that she did not need to take her medication.  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  When asked if she was having suicidal thoughts patient responded "I want to go to heaven. I don't think of it as suicide.  I don't want to kill myself.  I know I would go to hell."  Homicidal ideation: "I don't want to do anything but my church family yells at me a lot and the kids disrespect me; but I wouldn't hurt them; I just don't want to be in their presence."  Paranoia:  I know that people are trying to  harm me.  The prophet had told me that people were after me.  Patient also denies auditory/visual hallucinations and paranoia.   Patient states that she is married but separated from her husband; and that she is not close to her family and has no other family that is supportive of her or that she talks to.  Patient appears to be somewhat confused and not a good historian.  Patient is unable to tell what medications she has taken in the past.  States that she has been an hospitalized several time in the past but unable to tell how many times or what a she had been diagnosed with.    Patient states that she does not feel safe going back to live with the church family and would like to be placed in a residential facility where she can get help with her medication and back and forth to her appointments.    Associated Signs/Symptoms: Depression Symptoms:  depressed mood, feelings of worthlessness/guilt, hopelessness, (Hypo) Manic Symptoms:  Delusions, Hallucinations, Impulsivity, Irritable Mood, Anxiety Symptoms:  Excessive Worry, Specific Phobias, Psychotic Symptoms:  Hallucinations: Auditory PTSD Symptoms: Denies Total Time spent with patient: 45 minutes  Past Psychiatric History: Patient has had multiple psychiatric hospitalizations, and psychotropic medications.  [facility] 2/17.  States that she has been diagnosed with Bipolar disorder  Is the patient at risk to self? No.  Has the patient been a risk to self in the past 6 months? No.  Has the patient been a risk to self within the distant past? No.  Is the patient a risk to others? No.  Has the patient been a risk to others in the past 6 months? No.  Has the patient been a risk to others within the distant past? No.   Prior Inpatient Therapy:   Prior Outpatient Therapy:    Alcohol Screening: Patient refused Alcohol Screening Tool: Yes 1. How often do you have a drink containing alcohol?: Never 9. Have you or someone else been injured as  a result of your drinking?: No 10. Has a relative or friend or a doctor or another health worker been concerned about your drinking or suggested you cut down?: No Alcohol Use Disorder Identification Test Final Score (AUDIT): 0 Brief Intervention: Patient declined brief intervention Substance Abuse History in the last 12 months:  No. Consequences of Substance Abuse: NA Previous Psychotropic Medications: Yes  Psychological Evaluations: No  Past Medical History:  Past Medical History  Diagnosis Date  . Diabetes mellitus without complication ([place])   . Schizophrenia ([place])   . Bipolar affective disorder, currently manic, mild ([place])    History reviewed. No pertinent past surgical history. Family History:  Family History  Problem Relation Age of Onset  . Drug abuse Maternal Uncle    Family Psychiatric  History: Unaware Tobacco Screening: Never smoked Social History:  History  Alcohol Use No     History  Drug Use Not on file    Additional Social History:                           Allergies:  No Known Allergies Lab Results:  Results for orders placed or performed during the hospital encounter of 07/21/15 (from the past 48 hour(s))  Glucose, capillary     Status: Abnormal   Collection Time: 07/22/15  9:55 AM  Result Value Ref Range   Glucose-Capillary 135 (H) 65 - 99 mg/dL    Blood Alcohol level:  Lab Results  Component Value Date   ETH <5 07/21/2015   ETH <5 16/10/9602    Metabolic Disorder Labs:  Lab Results  Component Value Date   HGBA1C 6.4* 02/01/2015   MPG 137 02/01/2015   Lab Results  Component Value Date   PROLACTIN 21.6 02/01/2015   Lab Results  Component Value Date   CHOL 150 02/01/2015   TRIG 75 02/01/2015   HDL 57 02/01/2015   CHOLHDL 2.6 02/01/2015   VLDL 15 02/01/2015   LDLCALC 78 02/01/2015    Current Medications: Current Facility-Administered Medications  Medication Dose Route Frequency Provider Last Rate Last Dose  .  acetaminophen (TYLENOL) tablet 650 mg  650 mg Oral Q6H PRN Patrecia Pour, NP   650 mg at 07/22/15 0956  . alum & mag hydroxide-simeth (MAALOX/MYLANTA) 200-200-20 MG/5ML suspension 30 mL  30 mL Oral Q4H PRN Patrecia Pour, NP      . benztropine (COGENTIN) tablet 0.5 mg  0.5 mg Oral Daily Patrecia Pour, NP   0.5 mg at 07/22/15 0955  . divalproex (DEPAKOTE ER) 24 hr tablet 1,000 mg  1,000 mg Oral QHS Patrecia Pour, NP   1,000 mg at 07/21/15 2123  . hydrOXYzine (ATARAX/VISTARIL) tablet 25 mg  25 mg Oral TID PRN Patrecia Pour, NP   25 mg at 07/21/15 2345  . magnesium hydroxide (MILK OF MAGNESIA) suspension 30 mL  30 mL Oral Daily PRN Patrecia Pour, NP      .  metFORMIN (GLUCOPHAGE) tablet 500 mg  500 mg Oral Q breakfast Patrecia Pour, NP   500 mg at 07/22/15 0955  . oxybutynin (DITROPAN-XL) 24 hr tablet 5 mg  5 mg Oral QHS Patrecia Pour, NP   5 mg at 07/21/15 2316  . traZODone (DESYREL) tablet 100 mg  100 mg Oral QHS Patrecia Pour, NP   100 mg at 07/21/15 2303  . ziprasidone (GEODON) capsule 40 mg  40 mg Oral BID WC Shuvon B Rankin, NP       PTA Medications: Prescriptions prior to admission  Medication Sig Dispense Refill Last Dose  . ARIPiprazole (ABILIFY) 5 MG tablet Take one 5 mg tablet once daily at night 30 tablet 0 unknown  . benztropine (COGENTIN) 0.5 MG tablet Take one tablet once daily at night. 30 tablet 0 unknown  . divalproex (DEPAKOTE ER) 500 MG 24 hr tablet Take 1-1/2 tablets (750) once daily at night. (Patient taking differently: Take 1,000 mg by mouth at bedtime. ) 45 tablet 0 unknown  . docusate sodium (COLACE) 100 MG capsule Take 1 capsule (100 mg total) by mouth 2 (two) times daily. (May purchase from over the counter): For constipation 60 capsule 0 unknown  . fesoterodine (TOVIAZ) 4 MG TB24 tablet Take 1 tablet (4 mg total) by mouth daily. For over active bladder 30 tablet 0 unknown  . hydrOXYzine (ATARAX/VISTARIL) 25 MG tablet Take 1 tablet every 6 hours as needed for  anxiety 30 tablet 0 unknown  . metFORMIN (GLUCOPHAGE) 500 MG tablet Take 1 tablet (500 mg total) by mouth daily with breakfast. For diabetes management 30 tablet 0 unknown  . paliperidone (INVEGA) 6 MG 24 hr tablet Take 6 mg by mouth daily at 12 noon.   unknown  . tolterodine (DETROL) 2 MG tablet Take 1 tablet (2 mg total) by mouth 2 (two) times daily. 60 tablet 0 unknown  . traZODone (DESYREL) 100 MG tablet Take 100 mg by mouth at bedtime.   unknown  . ziprasidone (GEODON) 80 MG capsule Take 160 mg by mouth at bedtime.   unknown  . zolpidem (AMBIEN) 5 MG tablet Take 1 tablet (5 mg total) by mouth at bedtime as needed for sleep. 30 tablet 0 unknown    Musculoskeletal: Strength & Muscle Tone: within normal limits Gait & Station: normal Patient leans: N/A  Psychiatric Specialty Exam: Physical Exam  Constitutional: She is oriented to person, place, and time.  Neck: Normal range of motion.  Respiratory: Effort normal.  Musculoskeletal: Normal range of motion.  Neurological: She is alert and oriented to person, place, and time.  Skin: Skin is warm and dry.  Psychiatric: Her speech is normal. Her mood appears anxious. She is actively hallucinating. Thought content is paranoid. She expresses impulsivity. She exhibits a depressed mood.    Review of Systems  Psychiatric/Behavioral: Positive for depression and hallucinations. The patient is nervous/anxious and has insomnia.   All other systems reviewed and are negative.   Blood pressure 104/83, pulse 100, temperature 98.6 F (37 C), temperature source Oral, resp. rate 16, height '5\' 4"'  (1.626 m).There is no weight on file to calculate BMI.  General Appearance: Casual  Eye Contact:  Fair  Speech:  Clear and Coherent and Normal Rate  Volume:  Normal  Mood:  Anxious, Depressed, Hopeless and Irritable  Affect:  Depressed and Flat  Thought Process:  Disorganized and Linear  Orientation:  Full (Time, Place, and Person)  Thought Content:   Hallucinations: Auditory and Paranoid Ideation  Suicidal Thoughts:  No  Homicidal Thoughts:  No  Memory:  Immediate;   Poor Recent;   Poor Remote;   Poor  Judgement:  Impaired  Insight:  Lacking  Psychomotor Activity:  Normal  Concentration:  Concentration: Fair and Attention Span: Fair  Recall:  [employer] of Knowledge:  Fair  Language:  Good  Akathisia:  No  Handed:  Right  AIMS (if indicated):     Assets:  Communication Skills Desire for Improvement  ADL's:  Intact  Cognition:  WNL  Sleep:  Number of Hours: 3       Treatment Plan Summary: Daily contact with patient to assess and evaluate symptoms and progress in treatment and Medication management  Observation Level/Precautions:  15 minute checks  Laboratory:  CBC Chemistry Profile UDS UA ETOH and pregnancy test  Psychotherapy:  Individual and group sessions  Medications:   Mood Stabilization:  Depakote ER 1000 mg Q hs; Geodon 40 mg Bid;  Insomnia: Trazodone 100 mg Q hs prn Drug Induced Extrapyramidal Symptoms: Cogentin 0.5 mg daily Anxiety: Vistaril 25 mg Tid prn Type 2 DM: Glucophage 500 mg daily Overactive Bladder:  Ditropan XL 5 mg Q hs  Consultations:  As appropriate  Discharge Concerns:  Safety, stabilization, and access to medication  Estimated LOS:  5-7 days  Other:     I certify that inpatient services furnished can reasonably be expected to improve the patient's condition.    Rankin, Delphia Grates, NP 7/16/20172:43 PM I have discussed case with NP and have met with patient  Agree with NP note and assessment  I have met with patient and discussed case with NP. Patient is a fair historian and at this time presents guarded, irritable .  Patient is a 35 year old single female . Moved to [place].  Patient reports she " wants to be in the hospital to get better", but at this time declines to elaborate on what symptoms she is having , she states " it is way too complicated  ". As per chart notes, reported fears of being poisoned by the government and getting this information from God . As discussed with NP, presented delusional ,hyper-religious , reporting prophets are communicating with her . Reported being pregnant, but pregnancy test negative. Non compliant with medications Patient denies any drug or alcohol abuse . Admission UDS and BAL negative . History of chronic psychiatric illness and has been diagnosed with Schizoaffective Disorder , Bipolar type, and was admitted to our unit in February /17 for psychosis . At the time was stabilized on Depakote ER and Abilify .  Of note, several antipsychotics listed in her prior to admission medications, including Geodon, Abilify, Invega.  Dx- Schizoaffective Disorder  Plan- Inpatient admission . At this time will continue management with Geodon. EKG done- No QT c prolongation at this time ( 433) and with Depakote ER .

## 2015-07-22 NOTE — Progress Notes (Signed)
DAR NOTE: Pt present with flat affect and depressed mood in the unit. Pt has been in bed all day, although she was able to come out for medications. As per self inventory, pt had a good night sleep, good appetite, low energy, and good concentration. Pt rate depression at 0, hopeless ness at 0, and anxiety at 0. Although pt stated she believed " people are out to get, I went on social media and talk a bout Trump and I believed the government is watching over me.  Pt's safety ensured with 15 minute and environmental checks. Pt currently denies SI/HI and A/V hallucinations. Pt verbally agrees to seek staff if SI/HI or A/VH occurs and to consult with staff before acting on these thoughts. Will continue POC.

## 2015-07-22 NOTE — BHH Counselor (Signed)
Attempt made to complete PSA with patient was unsuccessful earlier today aroun 3 PM. Upon approach patient was hesitant to state any willingness to participate in conversation. Once CSW shared purpose and reasons for conversation pt showed some willingness. After developing some rapport and obtaining pt's date of birth CSW asked how long patient has been in [place] as initial assessment noted recent move to [name]. Patient stated "It's been a long time, a very long time. For anybody it would be a long time but for me it has been an excruciatingly long tome." As patient was finishing the statement she excused herself to the bathroom and did not exit quickly. At one point patient stated "you may want to go on now."  CSW excused herself and checked in with staff to update them. Returned to pt's room after about 10 minutes and patient was still in bathroom. CSW recieved call late in the day near dinnertime stating patient was ready to talk. Unable to met at that time.  Sheilah Pigeon, LCSW

## 2015-07-22 NOTE — Progress Notes (Signed)
D: Patient labile, irritable and guarded. Pt paranoid of medications, refusing them all initially during HS med pass, but then eventually taking them later in the night. Pt suddenly got out of bed later in the shift, appearing angry and demanded to leave unit immediately. Pt stated "I am better, I had my pills and now I need to go! If ya'll don't discharge me in the morning, ya'll gonna have a problem on ya hands!". Pt needing redirection and reassurance from staff members. Pt laughing inappropriately at times and acting bizarrely. Pt waking up several times throughout the night and is refusing to turn off the bedroom lights due to paranoia. Pt with shirt wrapped around her head during the shift.  A: Q 15 minute safety checks, encourage staff/peer interaction, administer medications as ordered, redirect behaviors as needed. R: Pt denies SI/HI but continues to be paranoid of staff/peers.
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
## 2015-07-22 NOTE — BHH Group Notes (Addendum)
[facility] LCSW Group Therapy  07/22/2015  11:15 AM to Noon  Type of Therapy:  Group Therapy  Participation Level:  Did Not Attend despite encouragement from both CSW and RN   Sheilah Pigeon, LCSW

## 2015-07-22 NOTE — Progress Notes (Signed)
Psychoeducational Group Note  Date:  07/22/2015 Time:  2049  Group Topic/Focus:  Wrap-Up Group:   The focus of this group is to help patients review their daily goal of treatment and discuss progress on daily workbooks.  Participation Level: Did Not Attend  Participation Quality:  Not Applicable  Affect:  Not Applicable  Cognitive:  Not Applicable  Insight:  Not Applicable  Engagement in Group: Not Applicable  Additional Comments:  The patient did not attend group this evening.   Archie Balboa S 07/22/2015, 8:49 PM

## 2015-07-23 LAB — GLUCOSE, CAPILLARY: GLUCOSE-CAPILLARY: 93 mg/dL (ref 65–99)

## 2015-07-23 NOTE — Progress Notes (Signed)
D: Pt A & O X2. Presented with blunt affect, irritable mood and poor eye contact on initial approach this AM but brightened up a little as the shift progressed. Denied SI, HI, AVH and pain when assessed. Pt is guarded and isolative from peers. Stays in her room majority of this shift, out for medications when prompted and did not attend groups. Pt remains paranoid, still believes "people are looking for me, I just don't want to talk about it, I've been here for about 2-3 days now, when can I go home". Refused lunch and dinner when offered but tolerated Ensure well when offered.  A: Emotional support and availability provided to pt. Medications administered as prescribed without issues. Verbal encouragement offered towards group attendance.  Safety checks done at Q 15 minutes intervals without outburst or self harm gestures to note at this time. R: Pt took her medications as ordered when offered. Denies adverse drug reactions. Remains safe on and off unit. POC continues.

## 2015-07-23 NOTE — BHH Group Notes (Signed)
[facility] LCSW Aftercare Discharge Planning Group Note  07/23/2015 8:45 AM  Pt did not attend, declined invitation.   Peri Maris, [place] 07/23/2015 9:21 AM
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
## 2015-07-23 NOTE — BHH Group Notes (Signed)
[facility] LCSW Group Therapy  07/23/2015 1:15pm  Type of Therapy:  Group Therapy vercoming Obstacles  Pt did not attend, declined invitation.   Peri Maris, [place] 07/23/2015 7:04 PM

## 2015-07-23 NOTE — Progress Notes (Signed)
LCSW attempted to complete PSA with patient, however patient was in shower and unable to complete. Will follow up this afternoon if time allows.  Lane Hacker, MSW Clinical Social Work: Printmaker

## 2015-07-23 NOTE — Tx Team (Signed)
Interdisciplinary Treatment Plan Update (Adult) Date: 07/23/2015   Date: 07/23/2015 7:04 PM  Progress in Treatment:  Attending groups: No Participating in groups: No Taking medication as prescribed: Yes  Tolerating medication: Yes  Family/Significant othe contact made: No, CSW attempting to make contact with family Patient understands diagnosis: Limited insight Discussing patient identified problems/goals with staff: Yes  Medical problems stabilized or resolved: Yes  Denies suicidal/homicidal ideation: Yes Patient has not harmed self or Others: Yes   New problem(s) identified: None identified at this time.   Discharge Plan or Barriers: Pt will return home and follow-up with outpatient services.   Additional comments:  Patient and CSW reviewed pt's identified goals and treatment plan. Patient verbalized understanding and agreed to treatment plan.   Reason for Continuation of Hospitalization:  Medication stabilization Psychosis Paranoia  Estimated length of stay: 3-5 days  Review of initial/current patient goals per problem list:   1.  Goal(s): Patient will participate in aftercare plan  Met:  Yes  Target date: 3-5 days from date of admission   As evidenced by: Patient will participate within aftercare plan AEB aftercare provider and housing plan at discharge being identified.   07/23/15: Pt will return home and follow-up with outpatient services.  5.  Goal(s): Patient will demonstrate decreased signs of psychosis  . Met:  No . Target date: 3-5 days from date of admission  . As evidenced by: Patient will demonstrate decreased frequency of AVH or return to baseline function   07/23/15: Pt continues to exhibit signs of AVH and is guarded with staff, reports feeling that people are    "out to get her."  Attendees:  Patient:    Family:    Physician: Dr. Dwyane Dee, MD  07/23/2015 7:04 PM  Nursing: Phillis Haggis., RN; Desma Paganini, RN 07/23/2015 7:04 PM  Clinical Social Worker Peri Maris, [place] 07/23/2015 7:04 PM  Other:  07/23/2015 7:04 PM  Clinical: Lars Pinks, RN Case manager  07/23/2015 7:04 PM  Other:  07/23/2015 7:04 PM  Other:     Peri Maris, [place] Work [phone]

## 2015-07-23 NOTE — Progress Notes (Signed)
[facility] MD Progress Note  07/23/2015 2:41 PM [name] [name]  MRN:  [id number]   Subjective:  Patient reports " I can't tell you who is trying to kill me."  Objective:[name] [name] is awake, alert and oriented to person, place and time. Seen sitting on her bed reading the bible. When asked what she was reading pt reports "proverbs." and continue to reading without making eye contact. Patient was not interested in participation during this assessment. Pt appeared guarded, flat and paranoid. Re- assessed (attempted) Aracely and she became irritable when asked question. Patient didn't responded to questions. Patient asked "when can I go home." Support, encouragement and reassurance was provided.     Principal Problem: Schizoaffective disorder, bipolar type ([place]) Diagnosis:   Patient Active Problem List   Diagnosis Date Noted  . Insomnia [G47.00]   . Anxiety state [F41.1]   . Overactive bladder [N32.81]   . Diabetes mellitus ([place]) [E11.9] 02/08/2015  . Schizoaffective disorder, bipolar type ([place]) [F25.0] 01/28/2015  . Non compliance w medication regimen [Z91.14]    Total Time spent with patient: 30 minutes  Past Psychiatric History: See Above  Past Medical History:  Past Medical History  Diagnosis Date  . Diabetes mellitus without complication ([place])   . Schizophrenia ([place])   . Bipolar affective disorder, currently manic, mild ([place])    History reviewed. No pertinent past surgical history. Family History:  Family History  Problem Relation Age of Onset  . Drug abuse Maternal Uncle    Family Psychiatric  History: See H&P Social History:  History  Alcohol Use No     History  Drug Use Not on file    Social History   Social History  . Marital Status: Legally Separated    Spouse Name: N/A  . Number of Children: N/A  . Years of Education: N/A   Social History Main Topics  . Smoking status: Never Smoker   . Smokeless tobacco: None  . Alcohol Use: No  . Drug Use:  None  . Sexual Activity: Not Asked   Other Topics Concern  . None   Social History Narrative   Additional Social History:                         Sleep: Fair  Appetite:  Fair  Current Medications: Current Facility-Administered Medications  Medication Dose Route Frequency Provider Last Rate Last Dose  . acetaminophen (TYLENOL) tablet 650 mg  650 mg Oral Q6H PRN Patrecia Pour, NP   650 mg at 07/22/15 0956  . alum & mag hydroxide-simeth (MAALOX/MYLANTA) 200-200-20 MG/5ML suspension 30 mL  30 mL Oral Q4H PRN Patrecia Pour, NP      . benztropine (COGENTIN) tablet 0.5 mg  0.5 mg Oral Daily Patrecia Pour, NP   0.5 mg at 07/23/15 0831  . divalproex (DEPAKOTE ER) 24 hr tablet 1,000 mg  1,000 mg Oral QHS Patrecia Pour, NP   1,000 mg at 07/22/15 2144  . hydrOXYzine (ATARAX/VISTARIL) tablet 25 mg  25 mg Oral TID PRN Patrecia Pour, NP   25 mg at 07/22/15 2144  . magnesium hydroxide (MILK OF MAGNESIA) suspension 30 mL  30 mL Oral Daily PRN Patrecia Pour, NP      . metFORMIN (GLUCOPHAGE) tablet 500 mg  500 mg Oral Q breakfast Patrecia Pour, NP   500 mg at 07/23/15 0830  . oxybutynin (DITROPAN-XL) 24 hr tablet 5 mg  5 mg Oral QHS Asa Saunas  Lord, NP   5 mg at 07/22/15 2144  . traZODone (DESYREL) tablet 100 mg  100 mg Oral QHS Patrecia Pour, NP   100 mg at 07/22/15 2144  . ziprasidone (GEODON) capsule 40 mg  40 mg Oral BID WC Shuvon B Rankin, NP   40 mg at 07/23/15 0831    Lab Results:  Results for orders placed or performed during the hospital encounter of 07/21/15 (from the past 48 hour(s))  Glucose, capillary     Status: Abnormal   Collection Time: 07/22/15  9:55 AM  Result Value Ref Range   Glucose-Capillary 135 (H) 65 - 99 mg/dL  Glucose, capillary     Status: None   Collection Time: 07/23/15  5:58 AM  Result Value Ref Range   Glucose-Capillary 93 65 - 99 mg/dL    Blood Alcohol level:  Lab Results  Component Value Date   ETH <5 07/21/2015   ETH <5 0000000     Metabolic Disorder Labs: Lab Results  Component Value Date   HGBA1C 6.4* 02/01/2015   MPG 137 02/01/2015   Lab Results  Component Value Date   PROLACTIN 21.6 02/01/2015   Lab Results  Component Value Date   CHOL 150 02/01/2015   TRIG 75 02/01/2015   HDL 57 02/01/2015   CHOLHDL 2.6 02/01/2015   VLDL 15 02/01/2015   LDLCALC 78 02/01/2015    Physical Findings: AIMS: Facial and Oral Movements Muscles of Facial Expression: None, normal Lips and Perioral Area: None, normal Jaw: None, normal Tongue: None, normal,Extremity Movements Upper (arms, wrists, hands, fingers): None, normal Lower (legs, knees, ankles, toes): None, normal, Trunk Movements Neck, shoulders, hips: None, normal, Overall Severity Severity of abnormal movements (highest score from questions above): None, normal Incapacitation due to abnormal movements: None, normal Patient's awareness of abnormal movements (rate only patient's report): No Awareness, Dental Status Current problems with teeth and/or dentures?: No Does patient usually wear dentures?: No  CIWA:    COWS:     Musculoskeletal: Strength & Muscle Tone: within normal limits Gait & Station: normal Patient leans: N/A  Psychiatric Specialty Exam: Physical Exam  ROS  Blood pressure 104/83, pulse 100, temperature 98.6 F (37 C), temperature source Oral, resp. rate 16, height 5\' 4"  (1.626 m).There is no weight on file to calculate BMI.  General Appearance: Casual and Guarded  Eye Contact:  Minimal  Speech:  Clear and Coherent and Slow  Volume:  Normal  Mood:  Depressed and Irritable  Affect:  Flat and Restricted  Thought Process:  Coherent  Orientation:  Full (Time, Place, and Person)  Thought Content:  Delusions, Paranoid Ideation and Rumination  Suicidal Thoughts:  No  Homicidal Thoughts:  No  Memory:  Immediate;   Poor Remote;   Fair  Judgement:  Impaired  Insight:  Lacking  Psychomotor Activity:  Restlessness  Concentration:   Concentration: Poor  Recall:  Poor  Fund of Knowledge:  Good  Language:  Good  Akathisia:  No  Handed:  Right  AIMS (if indicated):     Assets:  Communication Skills Desire for Improvement Resilience  ADL's:  Intact  Cognition:  WNL  Sleep:  Number of Hours: 2     I agree with current treatment plan on 07/17//2017, Patient seen face-to-face for psychiatric evaluation follow-up, chart reviewed and case discussed with the MD Dwyane Dee. Reviewed the information documented and agree with the treatment plan.  Treatment Plan Summary: Daily contact with patient to assess and evaluate symptoms and progress in  treatment and Medication management   Mood Stabilization: Depakote ER 1000 mg Q hs; Geodon 40 mg Bid;  Insomnia: Trazodone 100 mg Q hs prn Drug Induced Extrapyramidal Symptoms: Cogentin 0.5 mg daily Anxiety: Vistaril 25 mg TID prn Type 2 DM: Glucophage 500 mg daily Overactive Bladder: Ditropan XL 5 mg Q  Will continue to monitor vitals ,medication compliance and treatment side effects while patient is here.  Reviewed labs: Orders placed for: TSH, prolactin, Lipid Panel, A1c and EKG- pending results CSW will start working on disposition.  Patient to participate in therapeutic milieu  Derrill [place], NP 07/23/2015, 2:41 PM

## 2015-07-23 NOTE — Progress Notes (Addendum)
D: Patient irritable, withdrawn and isolative to her room. Pt paranoid of others and needed much encouragement to take her HS medications. Pt initially declined medications, but as this RN walked out of her room pt yelled "Wait! He just told me to take them now or I would have bad things happen". Pt them took meds, but continued to respond to internal stimuli. Pt dismissive of staff.  A: Q 15 minute safety checks, encourage staff/peer interaction and group participation, administer medications as ordered by provider.  R: No s/s of distress noted during this shift.

## 2015-07-24 MED ORDER — ZIPRASIDONE HCL 80 MG PO CAPS
80.0000 mg | ORAL_CAPSULE | Freq: Two times a day (BID) | ORAL | Status: DC
  Administered 2015-07-24 – 2015-07-30 (×11): 80 mg via ORAL
  Filled 2015-07-24 (×6): qty 1
  Filled 2015-07-24: qty 2
  Filled 2015-07-24 (×10): qty 1

## 2015-07-24 MED ORDER — BENZTROPINE MESYLATE 0.5 MG PO TABS
0.5000 mg | ORAL_TABLET | Freq: Two times a day (BID) | ORAL | Status: DC
  Administered 2015-07-24 – 2015-07-27 (×6): 0.5 mg via ORAL
  Filled 2015-07-24 (×10): qty 1

## 2015-07-24 NOTE — Progress Notes (Signed)
[facility] MD Progress Note  07/24/2015 10:17 AM [name] [name]  MRN:  [id number]   Subjective:  Patient reports " Im doing fine. How much longer am I going to be here? I have a question for the SW, it is to hard to ask you?"  Objective:Tonnya Murray-Taylor is awake, alert and oriented to person, place and time. She is observed ambulating down the hall. She responds appropriately when her name is called. She is carrying a book with her and flipping through her SW packet and continues to read without making eye contact. Patient was actively participating with this assessment, although her speech was pressured. Pt appeared guarded, flat and paranoid. Patient responded appropriately to all questions. She continues to ruminate around her discharge date. Patient asked "when can I go home." Support, encouragement and reassurance was provided. She reports sleeping and eating well at this time. She is tolerating her medications well noting that " the side effects is nothing I cant handle. They used to make my heart vessels hurt not blood vessels. " She reports staying with her church family, and will be discharged home.     Principal Problem: Schizoaffective disorder, bipolar type ([place]) Diagnosis:   Patient Active Problem List   Diagnosis Date Noted  . Insomnia [G47.00]   . Anxiety state [F41.1]   . Overactive bladder [N32.81]   . Diabetes mellitus ([place]) [E11.9] 02/08/2015  . Schizoaffective disorder, bipolar type ([place]) [F25.0] 01/28/2015  . Non compliance w medication regimen [Z91.14]    Total Time spent with patient: 30 minutes  Past Psychiatric History: See Above  Past Medical History:  Past Medical History  Diagnosis Date  . Diabetes mellitus without complication ([place])   . Schizophrenia ([place])   . Bipolar affective disorder, currently manic, mild ([place])    History reviewed. No pertinent past surgical history. Family History:  Family History  Problem Relation Age of Onset  . Drug abuse  Maternal Uncle    Family Psychiatric  History: See H&P Social History:  History  Alcohol Use No     History  Drug Use Not on file    Social History   Social History  . Marital Status: Legally Separated    Spouse Name: N/A  . Number of Children: N/A  . Years of Education: N/A   Social History Main Topics  . Smoking status: Never Smoker   . Smokeless tobacco: None  . Alcohol Use: No  . Drug Use: None  . Sexual Activity: Not Asked   Other Topics Concern  . None   Social History Narrative   Additional Social History:    Sleep: Good  Appetite:  Good  Current Medications: Current Facility-Administered Medications  Medication Dose Route Frequency Provider Last Rate Last Dose  . acetaminophen (TYLENOL) tablet 650 mg  650 mg Oral Q6H PRN Patrecia Pour, NP   650 mg at 07/22/15 0956  . alum & mag hydroxide-simeth (MAALOX/MYLANTA) 200-200-20 MG/5ML suspension 30 mL  30 mL Oral Q4H PRN Patrecia Pour, NP      . benztropine (COGENTIN) tablet 0.5 mg  0.5 mg Oral Daily Patrecia Pour, NP   0.5 mg at 07/24/15 0907  . divalproex (DEPAKOTE ER) 24 hr tablet 1,000 mg  1,000 mg Oral QHS Patrecia Pour, NP   1,000 mg at 07/23/15 2145  . hydrOXYzine (ATARAX/VISTARIL) tablet 25 mg  25 mg Oral TID PRN Patrecia Pour, NP   25 mg at 07/22/15 2144  . magnesium hydroxide (MILK OF MAGNESIA)  suspension 30 mL  30 mL Oral Daily PRN Patrecia Pour, NP      . metFORMIN (GLUCOPHAGE) tablet 500 mg  500 mg Oral Q breakfast Patrecia Pour, NP   500 mg at 07/24/15 0908  . oxybutynin (DITROPAN-XL) 24 hr tablet 5 mg  5 mg Oral QHS Patrecia Pour, NP   5 mg at 07/23/15 2145  . traZODone (DESYREL) tablet 100 mg  100 mg Oral QHS Patrecia Pour, NP   100 mg at 07/23/15 2145  . ziprasidone (GEODON) capsule 80 mg  80 mg Oral BID WC Nanci Pina, FNP        Lab Results:  Results for orders placed or performed during the hospital encounter of 07/21/15 (from the past 48 hour(s))  Glucose, capillary     Status:  None   Collection Time: 07/23/15  5:58 AM  Result Value Ref Range   Glucose-Capillary 93 65 - 99 mg/dL    Blood Alcohol level:  Lab Results  Component Value Date   ETH <5 07/21/2015   ETH <5 0000000    Metabolic Disorder Labs: Lab Results  Component Value Date   HGBA1C 6.4* 02/01/2015   MPG 137 02/01/2015   Lab Results  Component Value Date   PROLACTIN 21.6 02/01/2015   Lab Results  Component Value Date   CHOL 150 02/01/2015   TRIG 75 02/01/2015   HDL 57 02/01/2015   CHOLHDL 2.6 02/01/2015   VLDL 15 02/01/2015   LDLCALC 78 02/01/2015    Physical Findings: AIMS: Facial and Oral Movements Muscles of Facial Expression: None, normal Lips and Perioral Area: None, normal Jaw: None, normal Tongue: None, normal,Extremity Movements Upper (arms, wrists, hands, fingers): None, normal Lower (legs, knees, ankles, toes): None, normal, Trunk Movements Neck, shoulders, hips: None, normal, Overall Severity Severity of abnormal movements (highest score from questions above): None, normal Incapacitation due to abnormal movements: None, normal Patient's awareness of abnormal movements (rate only patient's report): No Awareness, Dental Status Current problems with teeth and/or dentures?: No Does patient usually wear dentures?: No  CIWA:    COWS:     Musculoskeletal: Strength & Muscle Tone: within normal limits Gait & Station: normal Patient leans: N/A  Psychiatric Specialty Exam: Physical Exam   ROS   Blood pressure 104/83, pulse 100, temperature 98.6 F (37 C), temperature source Oral, resp. rate 16, height 5\' 4"  (1.626 m).There is no weight on file to calculate BMI.  General Appearance: Casual and Guarded  Eye Contact:  Minimal  Speech:  Clear and Coherent and Pressured  Volume:  Normal  Mood:  Depressed and Irritable  Affect:  Flat and Restricted  Thought Process:  Coherent  Orientation:  Full (Time, Place, and Person)  Thought Content:  Delusions, Paranoid  Ideation and Rumination  Suicidal Thoughts:  No  Homicidal Thoughts:  No  Memory:  Immediate;   Fair Remote;   Fair  Judgement:  Intact  Insight:  Lacking  Psychomotor Activity:  Normal  Concentration:  Concentration: Poor  Recall:  Poor  Fund of Knowledge:  Good  Language:  Good  Akathisia:  No  Handed:  Right  AIMS (if indicated):     Assets:  Communication Skills Desire for Improvement Resilience  ADL's:  Intact  Cognition:  WNL  Sleep:  Number of Hours: 5.5     I agree with current treatment plan on 07/18//2017, Patient seen face-to-face for psychiatric evaluation follow-up, chart reviewed and case discussed with the MD Dwyane Dee. Reviewed  the information documented and agree with the treatment plan.  Treatment Plan Summary: Daily contact with patient to assess and evaluate symptoms and progress in treatment and Medication management   Mood Stabilization: Depakote ER 1000 mg Q hs;  Depakote level order for 07/25/2015 AM; Will increase Geodon 80 mg Bid to home dose;  EKG ordered on 07/22/15; NSR; QT/QTC 354/433.  Insomnia: Trazodone 100 mg Q hs prn Drug Induced Extrapyramidal Symptoms: Cogentin 0.5 mg po BID to assist with increase in Geodon.  Anxiety: Vistaril 25 mg TID prn Type 2 DM: Glucophage 500 mg daily Overactive Bladder: Ditropan XL 5 mg Q  Will continue to monitor vitals ,medication compliance and treatment side effects while patient is here.  Reviewed labs: Orders placed for: TSH, prolactin, Lipid Panel, A1c - patient refused labs. Will encourage patient to have labs ordered to prevent extended length of stay.  CSW will start working on disposition.  Patient to participate in therapeutic milieu  Nanci Pina, FNP 07/24/2015, 10:17 AM

## 2015-07-24 NOTE — Progress Notes (Signed)
DAR NOTE: Patient presents with anxious mood and affect. Denies pain, auditory and visual hallucinations.  Refused self inventory assessment after several encouragement.  Patient preoccupied with getting discharge.  Maintained on routine safety checks.  Medications given as prescribed.  Support and encouragement offered as needed.  Attended group and participated.  Patient observed socializing with peers in the dayroom.  Offered no complaint.

## 2015-07-24 NOTE — Progress Notes (Addendum)
D  Pt. Denies SI and HI, no complaints of pain or discomfort noted at present time.   A Writer offered support and encouragement.  Attempted to discuss pt's day without success.  R  Pt.  Initially  Refused to answer writers questions stating (I don't think it's necessary for me to answer your questions) but eventually did rate her depression a 5.    Pt. Remains safe on the unit.

## 2015-07-24 NOTE — Progress Notes (Signed)
Adult Psychoeducational Group Note  Date:  07/24/2015 Time:  9:33 PM  Group Topic/Focus:  Wrap-Up Group:   The focus of this group is to help patients review their daily goal of treatment and discuss progress on daily workbooks.  Participation Level:  Did Not Attend  Participation Quality:  Did not attend  Affect:  Did not attend  Cognitive:  Did not attend  Insight: None  Engagement in Group:  Did not attend  Modes of Intervention:  Did not attend  Additional Comments:  Patient did not attend group  Henslee Lottman W Akbar Sacra [id number], 9:33 PM

## 2015-07-24 NOTE — Progress Notes (Signed)
Patient ID: [name] [name], female   DOB: 1980/10/12, 35 y.o.   MRN: [id number] D: Patient observed talking on the phone and pacing the the hallways. Pt still have scrubs wrapped on head to prevent the government from reading her mind. Pt endorses passive suicide ideation without command. Pt also reports hearing voices but has minimize from previous days.  Denies HI/VH and pain.No behavioral issues noted.  A: Support and encouragement offered as needed. Medications administered as prescribed.  R: Patient safe and compliant with medications.

## 2015-07-24 NOTE — Progress Notes (Signed)
Patient ID: [name] [name], female   DOB: 06-Aug-1980, 35 y.o.   MRN: [id number] Pt refused AM labs and CBG

## 2015-07-24 NOTE — [facility] Group Notes (Signed)
[facility] Group Notes:  (Nursing/MHT/Case Management/Adjunct)  Date:  07/24/2015  Time:  10:18 AM  Type of Therapy:  Nurse Education  Participation Level:  Active  Participation Quality:  Appropriate and Attentive  Affect:  Appropriate  Cognitive:  Alert and Appropriate  Insight:  Appropriate and Good  Engagement in Group:  Engaged  Modes of Intervention:  Discussion and Education  Summary of Progress/Problems: Topic was on recovery. Discussed recovery as a process. Group encouraged to stay focus and to complete the recovery process. Group discussed what recovery means to them and what changes they are here to maintained. Patient was attentive and receptive.  Mart Piggs 07/24/2015, 10:18 AM

## 2015-07-24 NOTE — [facility] Group Notes (Signed)
[place] LCSW Group Therapy Note  07/24/2015 2:45pm  Type of Therapy and Topic: Group Therapy: Holding onto Grudges   Pt attended group for a few moments but then left and did not return.   [name] [name], [place] 07/24/2015 2:22 PM

## 2015-07-25 LAB — LIPID PANEL
CHOLESTEROL: 135 mg/dL (ref 0–200)
HDL: 50 mg/dL (ref >40)
LDL CALC: 73 mg/dL (ref 0–99)
TRIGLYCERIDES: 59 mg/dL (ref <150)
Total CHOL/HDL Ratio: 2.7 RATIO
VLDL: 12 mg/dL (ref 0–40)

## 2015-07-25 LAB — GLUCOSE, CAPILLARY: Glucose-Capillary: 91 mg/dL (ref 65–99)

## 2015-07-25 LAB — TSH: TSH: 0.469 u[IU]/mL (ref 0.350–4.500)

## 2015-07-25 LAB — VALPROIC ACID LEVEL: Valproic Acid Lvl: 92 ug/mL (ref 50.0–100.0)

## 2015-07-25 NOTE — Progress Notes (Signed)
DAR NOTE: Patient presents with anxious affect and depressed mood. Pt stated when asked about going to the cafeteria to eat" I don't like being around many people, I cant not stand the noise they make, I just want peace and quite so  I can get back to track. Denies pain, auditory and visual hallucinations.  Rates depression at 0, hopelessness at 0, and anxiety at 0.  Maintained on routine safety checks.  Medications given as prescribed.  Support and encouragement offered as needed.  Attended group and participated.  States goal for today is " learn."  Patient observed socializing with peers in the dayroom.  Offered no complaint.

## 2015-07-25 NOTE — Progress Notes (Signed)
[facility] MD Progress Note  07/25/2015 10:31 AM [name] [name]  MRN:  [id number]   Subjective:  Patient reports "I am ready to be released. How long will I have to stay here to be studied?"  Objective:[name] [name] is awake, alert and oriented to person, place and time.See resting on her bed reading a bible. Patient appeared irritable, flat and guarded.  Patient is hyper religous thoughts with paranoia. Patient reports " I have written down my goals that you asked for on yesterday." Patient reports her goals for today." is to relax and  too work on here person daily medication." Patient reports she is relaxed now that she is no long with her husband. Patient didn't elaborate when asked about her husband or other stressors. Patient reports fair appetite and states she is resting well throughout the night. Support, encouragement and reassurance was provided.       Principal Problem: Schizoaffective disorder, bipolar type ([place]) Diagnosis:   Patient Active Problem List   Diagnosis Date Noted  . Insomnia [G47.00]   . Anxiety state [F41.1]   . Overactive bladder [N32.81]   . Diabetes mellitus ([place]) [E11.9] 02/08/2015  . Schizoaffective disorder, bipolar type ([place]) [F25.0] 01/28/2015  . Non compliance w medication regimen [Z91.14]    Total Time spent with patient: 30 minutes  Past Psychiatric History: See Above  Past Medical History:  Past Medical History  Diagnosis Date  . Diabetes mellitus without complication ([place])   . Schizophrenia ([place])   . Bipolar affective disorder, currently manic, mild ([place])    History reviewed. No pertinent past surgical history. Family History:  Family History  Problem Relation Age of Onset  . Drug abuse Maternal Uncle    Family Psychiatric  History: See H&P Social History:  History  Alcohol Use No     History  Drug Use Not on file    Social History   Social History  . Marital Status: Legally Separated    Spouse Name: N/A  . Number of  Children: N/A  . Years of Education: N/A   Social History Main Topics  . Smoking status: Never Smoker   . Smokeless tobacco: None  . Alcohol Use: No  . Drug Use: None  . Sexual Activity: Not Asked   Other Topics Concern  . None   Social History Narrative   Additional Social History:    Sleep: Good  Appetite:  Good  Current Medications: Current Facility-Administered Medications  Medication Dose Route Frequency Provider Last Rate Last Dose  . acetaminophen (TYLENOL) tablet 650 mg  650 mg Oral Q6H PRN Patrecia Pour, NP   650 mg at 07/22/15 0956  . alum & mag hydroxide-simeth (MAALOX/MYLANTA) 200-200-20 MG/5ML suspension 30 mL  30 mL Oral Q4H PRN Patrecia Pour, NP      . benztropine (COGENTIN) tablet 0.5 mg  0.5 mg Oral BID Nanci Pina, FNP   0.5 mg at 07/25/15 [id number]  . divalproex (DEPAKOTE ER) 24 hr tablet 1,000 mg  1,000 mg Oral QHS Patrecia Pour, NP   1,000 mg at 07/24/15 2134  . hydrOXYzine (ATARAX/VISTARIL) tablet 25 mg  25 mg Oral TID PRN Patrecia Pour, NP   25 mg at 07/22/15 2144  . magnesium hydroxide (MILK OF MAGNESIA) suspension 30 mL  30 mL Oral Daily PRN Patrecia Pour, NP      . metFORMIN (GLUCOPHAGE) tablet 500 mg  500 mg Oral Q breakfast Patrecia Pour, NP   500 mg at 07/25/15 0811  .  oxybutynin (DITROPAN-XL) 24 hr tablet 5 mg  5 mg Oral QHS Patrecia Pour, NP   5 mg at 07/24/15 2134  . traZODone (DESYREL) tablet 100 mg  100 mg Oral QHS Patrecia Pour, NP   100 mg at 07/24/15 2134  . ziprasidone (GEODON) capsule 80 mg  80 mg Oral BID WC Nanci Pina, FNP   80 mg at 07/25/15 [id number]    Lab Results:  Results for orders placed or performed during the hospital encounter of 07/21/15 (from the past 48 hour(s))  Glucose, capillary     Status: None   Collection Time: 07/25/15  5:42 AM  Result Value Ref Range   Glucose-Capillary 91 65 - 99 mg/dL   Comment 1 Notify RN   Valproic acid level     Status: None   Collection Time: 07/25/15  6:27 AM  Result Value Ref  Range   Valproic Acid Lvl 92 50.0 - 100.0 ug/mL    Comment: Performed at [facility]  TSH     Status: None   Collection Time: 07/25/15  6:27 AM  Result Value Ref Range   TSH 0.469 0.350 - 4.500 uIU/mL    Comment: Performed at [facility]  Lipid panel     Status: None   Collection Time: 07/25/15  6:27 AM  Result Value Ref Range   Cholesterol 135 0 - 200 mg/dL   Triglycerides 59 <150 mg/dL   HDL 50 >40 mg/dL   Total CHOL/HDL Ratio 2.7 RATIO   VLDL 12 0 - 40 mg/dL   LDL Cholesterol 73 0 - 99 mg/dL    Comment:        Total Cholesterol/HDL:CHD Risk Coronary Heart Disease Risk Table                     Men   Women  1/2 Average Risk   3.4   3.3  Average Risk       5.0   4.4  2 X Average Risk   9.6   7.1  3 X Average Risk  23.4   11.0        Use the calculated Patient Ratio above and the CHD Risk Table to determine the patient's CHD Risk.        ATP III CLASSIFICATION (LDL):  <100     mg/dL   Optimal  100-129  mg/dL   Near or Above                    Optimal  130-159  mg/dL   Borderline  160-189  mg/dL   High  >190     mg/dL   Very High Performed at [facility]     Blood Alcohol level:  Lab Results  Component Value Date   [facility] <5 07/21/2015   ETH <5 0000000    Metabolic Disorder Labs: Lab Results  Component Value Date   HGBA1C 6.4* 02/01/2015   MPG 137 02/01/2015   Lab Results  Component Value Date   PROLACTIN 21.6 02/01/2015   Lab Results  Component Value Date   CHOL 135 07/25/2015   TRIG 59 07/25/2015   HDL 50 07/25/2015   CHOLHDL 2.7 07/25/2015   VLDL 12 07/25/2015   LDLCALC 73 07/25/2015   LDLCALC 78 02/01/2015    Physical Findings: AIMS: Facial and Oral Movements Muscles of Facial Expression: None, normal Lips and Perioral Area: None, normal Jaw: None, normal Tongue: None, normal,Extremity  Movements Upper (arms, wrists, hands, fingers): None, normal Lower (legs, knees, ankles, toes): None, normal,  Trunk Movements Neck, shoulders, hips: None, normal, Overall Severity Severity of abnormal movements (highest score from questions above): None, normal Incapacitation due to abnormal movements: None, normal Patient's awareness of abnormal movements (rate only patient's report): No Awareness, Dental Status Current problems with teeth and/or dentures?: No Does patient usually wear dentures?: No  CIWA:    COWS:     Musculoskeletal: Strength & Muscle Tone: within normal limits Gait & Station: normal Patient leans: N/A  Psychiatric Specialty Exam: Physical Exam  Nursing note and vitals reviewed. Constitutional: She is oriented to person, place, and time.  Musculoskeletal: Normal range of motion.  Neurological: She is alert and oriented to person, place, and time.  Psychiatric: She has a normal mood and affect. Her behavior is normal.    Review of Systems  Psychiatric/Behavioral: Positive for depression. The patient is nervous/anxious.   All other systems reviewed and are negative.   Blood pressure 106/77, pulse 88, temperature 98 F (36.7 C), temperature source Oral, resp. rate 16, height 5\' 4"  (1.626 m).There is no weight on file to calculate BMI.  General Appearance: Casual and Guarded  Eye Contact:  Minimal  Speech:  Clear and Coherent and Pressured  Volume:  Normal  Mood:  Depressed and Irritable  Affect:  Flat and Restricted  Thought Process:  Coherent  Orientation:  Full (Time, Place, and Person)  Thought Content:  Delusions, Paranoid Ideation and Rumination  Suicidal Thoughts:  No  Homicidal Thoughts:  No  Memory:  Immediate;   Fair Remote;   Fair  Judgement:  Intact  Insight:  Lacking  Psychomotor Activity:  Normal  Concentration:  Concentration: Poor  Recall:  Poor  Fund of Knowledge:  Good  Language:  Good  Akathisia:  No  Handed:  Right  AIMS (if indicated):     Assets:  Communication Skills Desire for Improvement Resilience  ADL's:  Intact  Cognition:   WNL  Sleep:  Number of Hours: 3.75     I agree with current treatment plan on 07/18//2017, Patient seen face-to-face for psychiatric evaluation follow-up, chart reviewed and case discussed with the MD Dwyane Dee. Reviewed the information documented and agree with the treatment plan.  Treatment Plan Summary: Daily contact with patient to assess and evaluate symptoms and progress in treatment and Medication management   Mood Stabilization: Depakote ER 1000 mg Q hs;  Depakote level order for 07/25/2015 AM; Will increase Geodon 80 mg Bid to home dose;  EKG ordered on 07/22/15; NSR; QT/QTC 354/433.  Insomnia: Trazodone 100 mg Q hs prn Drug Induced Extrapyramidal Symptoms: Cogentin 0.5 mg po BID to assist with increase in Geodon.  Anxiety: Vistaril 25 mg TID prn Type 2 DM: Glucophage 500 mg daily Overactive Bladder: Ditropan XL 5 mg Q  Will continue to monitor vitals ,medication compliance and treatment side effects while patient is here.  Reviewed labs: Orders placed for: TSH, prolactin, Lipid Panel, A1c - patient refused labs. Will encourage patient to have labs ordered to prevent extended length of stay.  CSW will start working on disposition.  Patient to participate in therapeutic milieu  Derrill [place], NP 07/25/2015, 10:31 AM

## 2015-07-25 NOTE — Plan of Care (Signed)
Problem: Activity: Goal: Sleeping patterns will improve Outcome: Progressing Patient in bed with eyes closed and even non-labored chest rises.

## 2015-07-25 NOTE — Progress Notes (Addendum)
Nursing Note 07/25/2015 1900 - 07/26/15 0730  Data Patient being monitored inpatient for paranoia, delusions, and AVH per admission tele assessment note.  Irritable throughout shift, stayed in bed, did not attend group.  Denied SI, HI, and AVH.  Continues to wear cloth over head.  Complains "the medicine is too strong, it makes me sleepy."  but then stated shortly after "The best way to deal with this is to just take the medicine and sleep."  During med pass patient refused to leave room, nurse brought medicine into patients room and patient had eyes closed and wasn't responding to nurse's call.  After the third time patient opened her eyes and said "I can hear you just fine, just go away."  She eventually took all HS medication stating "just give me all the medication."  On the way out the door the nurse asked if patient wanted her door closed to which patient shouted with angry affect "are you trying to agitate me?"    Action Spoke with patient 1:1 beginning of shift until patient demanded nurse leave the room (about 1 minute), offered to be a support for duration of shift.  Remained on 15 minute checks for safety.  Response Remains safe on unit, though irritable.  In bed with eyes closed at this time with even non-labored chest rises.

## 2015-07-25 NOTE — Progress Notes (Signed)
Patient ID: [name] [name], female   DOB: 22-Nov-1980, 35 y.o.   MRN: [id number] PER STATE REGULATIONS 482.30  THIS CHART WAS REVIEWED FOR MEDICAL NECESSITY WITH RESPECT TO THE PATIENT'S ADMISSION/ DURATION OF STAY.  NEXT REVIEW DATE: 07/29/2015  Chauncy Lean, RN, BSN CASE MANAGER

## 2015-07-25 NOTE — BHH Group Notes (Signed)
[facility] Mental [employer] Association Group Therapy  07/25/2015 , 1:04 PM    Type of Therapy:  Mental Health Association Presentation  Participation Level:  Active  Participation Quality:  Attentive  Affect:  Blunted  Cognitive:  Oriented  Insight:  Limited  Engagement in Therapy:  Engaged  Modes of Intervention:  Discussion, Education and Socialization  Summary of Progress/Problems:  Shanon Brow from [place] came to present his recovery story and play the guitar.  Invited.  Chose to not attend.  Roque Lias B 07/25/2015 , 1:04 PM

## 2015-07-26 LAB — HEMOGLOBIN A1C
Hgb A1c MFr Bld: 5.6 % (ref 4.8–5.6)
MEAN PLASMA GLUCOSE: 114 mg/dL

## 2015-07-26 LAB — PROLACTIN: PROLACTIN: 46.5 ng/mL — AB (ref 4.8–23.3)

## 2015-07-26 NOTE — Progress Notes (Signed)
Patient did not attend karaoke group tonight. 

## 2015-07-26 NOTE — Plan of Care (Signed)
Problem: Coping: Goal: Ability to interact with others will improve Outcome: Progressing Patient states I will improve on my communication skills and learn to address issues appropriately.

## 2015-07-26 NOTE — Progress Notes (Signed)
DAR NOTE: Patient mood and affect remained labile.  Denies pain, auditory and visual hallucinations.  Rates depression at 0, hopelessness at 0, and anxiety at 2.  Maintained on routine safety checks.  Medications given as prescribed.  Support and encouragement offered as needed.  States goal for today is "my attitude."  Patient remained isolative to her room most of this shift.

## 2015-07-26 NOTE — Progress Notes (Signed)
[facility] MD Progress Note  07/26/2015 1:15 PM [name] [name]  MRN:  [id number]   Subjective:  Patient reports "I am ready to be released. How long will I have to stay here to be studied?"  Objective:[name] [name] is awake, alert and oriented to person, place and time.See resting on her bed reading a bible. Patient appeared irritable, flat and guarded.  Patient is hyper religous thoughts with paranoia. Patient reports " I have written down my goals that you asked for on yesterday." Patient reports her goals for today." is to relax and  too work on here person daily medication." Patient reports she is relaxed now that she is no long with her husband. Patient didn't elaborate when asked about her husband or other stressors. Patient reports fair appetite and states she is resting well throughout the night. Support, encouragement and reassurance was provided.       Principal Problem: Schizoaffective disorder, bipolar type ([place]) Diagnosis:   Patient Active Problem List   Diagnosis Date Noted  . Schizoaffective disorder, bipolar type ([place]) [F25.0] 01/28/2015    Priority: High  . Insomnia [G47.00]   . Anxiety state [F41.1]   . Overactive bladder [N32.81]   . Diabetes mellitus ([place]) [E11.9] 02/08/2015  . Non compliance w medication regimen [Z91.14]    Total Time spent with patient: 30 minutes  Past Psychiatric History: See Above  Past Medical History:  Past Medical History  Diagnosis Date  . Diabetes mellitus without complication ([place])   . Schizophrenia ([place])   . Bipolar affective disorder, currently manic, mild ([place])    History reviewed. No pertinent past surgical history. Family History:  Family History  Problem Relation Age of Onset  . Drug abuse Maternal Uncle    Family Psychiatric  History: See H&P Social History:  History  Alcohol Use No     History  Drug Use Not on file    Social History   Social History  . Marital Status: Legally Separated    Spouse Name:  N/A  . Number of Children: N/A  . Years of Education: N/A   Social History Main Topics  . Smoking status: Never Smoker   . Smokeless tobacco: None  . Alcohol Use: No  . Drug Use: None  . Sexual Activity: Not Asked   Other Topics Concern  . None   Social History Narrative   Additional Social History:    Sleep: Good  Appetite:  Good  Current Medications: Current Facility-Administered Medications  Medication Dose Route Frequency Provider Last Rate Last Dose  . acetaminophen (TYLENOL) tablet 650 mg  650 mg Oral Q6H PRN Patrecia Pour, NP   650 mg at 07/22/15 0956  . alum & mag hydroxide-simeth (MAALOX/MYLANTA) 200-200-20 MG/5ML suspension 30 mL  30 mL Oral Q4H PRN Patrecia Pour, NP      . benztropine (COGENTIN) tablet 0.5 mg  0.5 mg Oral BID Nanci Pina, FNP   0.5 mg at 07/26/15 0803  . divalproex (DEPAKOTE ER) 24 hr tablet 1,000 mg  1,000 mg Oral QHS Patrecia Pour, NP   1,000 mg at 07/25/15 2144  . hydrOXYzine (ATARAX/VISTARIL) tablet 25 mg  25 mg Oral TID PRN Patrecia Pour, NP   25 mg at 07/22/15 2144  . magnesium hydroxide (MILK OF MAGNESIA) suspension 30 mL  30 mL Oral Daily PRN Patrecia Pour, NP      . metFORMIN (GLUCOPHAGE) tablet 500 mg  500 mg Oral Q breakfast Patrecia Pour, NP  500 mg at 07/26/15 0803  . oxybutynin (DITROPAN-XL) 24 hr tablet 5 mg  5 mg Oral QHS Patrecia Pour, NP   5 mg at 07/25/15 2143  . traZODone (DESYREL) tablet 100 mg  100 mg Oral QHS Patrecia Pour, NP   100 mg at 07/25/15 2143  . ziprasidone (GEODON) capsule 80 mg  80 mg Oral BID WC Nanci Pina, FNP   80 mg at 07/26/15 [id number]    Lab Results:  Results for orders placed or performed during the hospital encounter of 07/21/15 (from the past 48 hour(s))  Glucose, capillary     Status: None   Collection Time: 07/25/15  5:42 AM  Result Value Ref Range   Glucose-Capillary 91 65 - 99 mg/dL   Comment 1 Notify RN   Valproic acid level     Status: None   Collection Time: 07/25/15  6:27 AM   Result Value Ref Range   Valproic Acid Lvl 92 50.0 - 100.0 ug/mL    Comment: Performed at [facility]  TSH     Status: None   Collection Time: 07/25/15  6:27 AM  Result Value Ref Range   TSH 0.469 0.350 - 4.500 uIU/mL    Comment: Performed at [facility]  Prolactin     Status: Abnormal   Collection Time: 07/25/15  6:27 AM  Result Value Ref Range   Prolactin 46.5 (H) 4.8 - 23.3 ng/mL    Comment: (NOTE) Performed At: [facility] [place], [place] [id number] Lindon Romp MD [id number] Performed at [facility]   Hemoglobin A1c     Status: None   Collection Time: 07/25/15  6:27 AM  Result Value Ref Range   Hgb A1c MFr Bld 5.6 4.8 - 5.6 %    Comment: (NOTE)         Pre-diabetes: 5.7 - 6.4         Diabetes: >6.4         Glycemic control for adults with diabetes: <7.0    Mean Plasma Glucose 114 mg/dL    Comment: (NOTE) Performed At: [facility] [place], [place] [id number] Lindon Romp MD [id number] Performed at [facility]   Lipid panel     Status: None   Collection Time: 07/25/15  6:27 AM  Result Value Ref Range   Cholesterol 135 0 - 200 mg/dL   Triglycerides 59 <150 mg/dL   HDL 50 >40 mg/dL   Total CHOL/HDL Ratio 2.7 RATIO   VLDL 12 0 - 40 mg/dL   LDL Cholesterol 73 0 - 99 mg/dL    Comment:        Total Cholesterol/HDL:CHD Risk Coronary Heart Disease Risk Table                     Men   Women  1/2 Average Risk   3.4   3.3  Average Risk       5.0   4.4  2 X Average Risk   9.6   7.1  3 X Average Risk  23.4   11.0        Use the calculated Patient Ratio above and the CHD Risk Table to determine the patient's CHD Risk.        ATP III CLASSIFICATION (LDL):  <100     mg/dL   Optimal  100-129  mg/dL   Near or Above  Optimal  130-159  mg/dL   Borderline  160-189  mg/dL   High  >190     mg/dL   Very High Performed  at [facility]     Blood Alcohol level:  Lab Results  Component Value Date   [facility] <5 07/21/2015   ETH <5 0000000    Metabolic Disorder Labs: Lab Results  Component Value Date   HGBA1C 5.6 07/25/2015   MPG 114 07/25/2015   MPG 137 02/01/2015   Lab Results  Component Value Date   PROLACTIN 46.5* 07/25/2015   PROLACTIN 21.6 02/01/2015   Lab Results  Component Value Date   CHOL 135 07/25/2015   TRIG 59 07/25/2015   HDL 50 07/25/2015   CHOLHDL 2.7 07/25/2015   VLDL 12 07/25/2015   LDLCALC 73 07/25/2015   LDLCALC 78 02/01/2015    Physical Findings: AIMS: Facial and Oral Movements Muscles of Facial Expression: None, normal Lips and Perioral Area: None, normal Jaw: None, normal Tongue: None, normal,Extremity Movements Upper (arms, wrists, hands, fingers): None, normal Lower (legs, knees, ankles, toes): None, normal, Trunk Movements Neck, shoulders, hips: None, normal, Overall Severity Severity of abnormal movements (highest score from questions above): None, normal Incapacitation due to abnormal movements: None, normal Patient's awareness of abnormal movements (rate only patient's report): No Awareness, Dental Status Current problems with teeth and/or dentures?: No Does patient usually wear dentures?: No  CIWA:    COWS:     Musculoskeletal: Strength & Muscle Tone: within normal limits Gait & Station: normal Patient leans: N/A  Psychiatric Specialty Exam: Physical Exam  Nursing note and vitals reviewed. Constitutional: She is oriented to person, place, and time.  Musculoskeletal: Normal range of motion.  Neurological: She is alert and oriented to person, place, and time.  Psychiatric: She has a normal mood and affect. Her behavior is normal.    Review of Systems  Constitutional: Negative.  Negative for fever and malaise/fatigue.  HENT: Negative.  Negative for congestion and sore throat.   Eyes: Negative.  Negative for blurred vision, discharge and  redness.  Respiratory: Negative.  Negative for cough, shortness of breath and wheezing.   Cardiovascular: Negative.  Negative for chest pain and palpitations.  Gastrointestinal: Negative.  Negative for heartburn, nausea, vomiting, abdominal pain, diarrhea and constipation.  Genitourinary: Negative.  Negative for dysuria and urgency.  Musculoskeletal: Negative.  Negative for myalgias and falls.  Skin: Negative.  Negative for rash.  Neurological: Negative.  Negative for dizziness, seizures, loss of consciousness, weakness and headaches.  Endo/Heme/Allergies: Negative.  Negative for environmental allergies.  Psychiatric/Behavioral: Positive for depression. Negative for suicidal ideas and substance abuse. The patient is nervous/anxious. The patient does not have insomnia.   All other systems reviewed and are negative.   Blood pressure 106/77, pulse 88, temperature 98 F (36.7 C), temperature source Oral, resp. rate 16, height 5\' 4"  (1.626 m).There is no weight on file to calculate BMI.  General Appearance: Casual  Eye Contact:  Minimal  Speech:  Clear and Coherent  Volume:  Normal  Mood:  Depressed  Affect:  Flat and Restricted  Thought Process:  Coherent  Orientation:  Full (Time, Place, and Person)  Thought Content:  Rumination  Suicidal Thoughts:  No  Homicidal Thoughts:  No  Memory:  Immediate;   Fair Remote;   Fair  Judgement:  Intact  Insight:  Lacking  Psychomotor Activity:  Normal  Concentration:  Concentration: Poor  Recall:  Poor  Fund of Knowledge:  Good  Language:  Good  Akathisia:  No  Handed:  Right  AIMS (if indicated):     Assets:  Communication Skills Desire for Improvement Resilience  ADL's:  Intact  Cognition:  WNL  Sleep:  Number of Hours: 6.75     Treatment Plan Summary: Daily contact with patient to assess and evaluate symptoms and progress in treatment and Medication management   Mood Stabilization: Depakote ER 1000 mg Q hsFor mood  stabilization Continue Geodon 80 mg Bid for mood stabilization and psychosis. EKGs reviewed, no change since dosage increase Continue Trazodone 100 mg Q hs prn for sleep Continue Cogentin 0.5 mg po BID to assist with increase in Geodon.  Continue Vistaril 25 mg TID prn for anxiety Continue Glucophage 500 mg daily for type 2 diabetes Continue Ditropan XL 5 mg daily for overactive bladder  Will continue to monitor vitals ,medication compliance and treatment side effects while patient is here.  CSW working on disposition.  Patient to participate in therapeutic milieu  Hampton Abbot, MD

## 2015-07-26 NOTE — Progress Notes (Signed)
Patient refused AM CBG. 

## 2015-07-26 NOTE — BHH Group Notes (Signed)
[place] LCSW Group Therapy  07/26/2015 , 3:40 PM   Type of Therapy:  Group Therapy  Participation Level:  Active  Participation Quality:  Attentive  Affect:  Appropriate  Cognitive:  Alert  Insight:  Improving  Engagement in Therapy:  Engaged  Modes of Intervention:  Discussion, Exploration and Socialization  Summary of Progress/Problems: Today's group focused on the term Diagnosis.  Participants were asked to define the term, and then pronounce whether it is a negative, positive or neutral term.  Invited.  Chose to not attend.  Roque Lias B 07/26/2015 , 3:40 PM

## 2015-07-26 NOTE — BHH Group Notes (Signed)
[place] Group Notes:  (Nursing/MHT/Case Management/Adjunct)  Date:  07/26/2015  Time:  1:46 PM  Type of Therapy:  Nurse Education  Participation Level:  Did Not Attend   Mart Piggs 07/26/2015, 1:46 PM

## 2015-07-27 LAB — GLUCOSE, CAPILLARY: Glucose-Capillary: 98 mg/dL (ref 65–99)

## 2015-07-27 MED ORDER — DIVALPROEX SODIUM ER 500 MG PO TB24: 1000.0000 mg | ORAL_TABLET | Freq: Every day | ORAL | Status: DC

## 2015-07-27 MED ORDER — BENZTROPINE MESYLATE 0.5 MG PO TABS
0.5000 mg | ORAL_TABLET | Freq: Two times a day (BID) | ORAL | Status: DC | PRN
  Filled 2015-07-27: qty 10

## 2015-07-27 MED ORDER — TRAZODONE HCL 100 MG PO TABS
100.0000 mg | ORAL_TABLET | Freq: Every day | ORAL | Status: DC
  Filled 2015-07-27: qty 7

## 2015-07-27 MED ORDER — ZIPRASIDONE HCL 80 MG PO CAPS: 80.0000 mg | ORAL_CAPSULE | Freq: Two times a day (BID) | ORAL | Status: DC

## 2015-07-27 MED ORDER — BENZTROPINE MESYLATE 0.5 MG PO TABS: 0.5000 mg | ORAL_TABLET | Freq: Two times a day (BID) | ORAL | Status: DC

## 2015-07-27 MED ORDER — HYDROXYZINE HCL 25 MG PO TABS: 25.0000 mg | ORAL_TABLET | Freq: Three times a day (TID) | ORAL | Status: DC | PRN

## 2015-07-27 MED ORDER — TRAZODONE HCL 50 MG PO TABS
50.0000 mg | ORAL_TABLET | Freq: Every evening | ORAL | Status: DC | PRN
  Administered 2015-07-27 – 2015-07-30 (×4): 50 mg via ORAL
  Filled 2015-07-27 (×4): qty 1

## 2015-07-27 MED ORDER — TRAZODONE HCL 100 MG PO TABS: 100.0000 mg | ORAL_TABLET | Freq: Every day | ORAL | Status: DC

## 2015-07-27 MED ORDER — DOCUSATE SODIUM 100 MG PO CAPS
100.0000 mg | ORAL_CAPSULE | Freq: Every day | ORAL | Status: DC
  Administered 2015-07-27 – 2015-08-07 (×10): 100 mg via ORAL
  Filled 2015-07-27 (×14): qty 1

## 2015-07-27 MED ORDER — OXYBUTYNIN CHLORIDE ER 5 MG PO TB24: 5.0000 mg | ORAL_TABLET | Freq: Every day | ORAL | Status: DC

## 2015-07-27 MED ORDER — METFORMIN HCL 500 MG PO TABS: 500.0000 mg | ORAL_TABLET | Freq: Every day | ORAL | Status: DC

## 2015-07-27 MED ORDER — POLYETHYLENE GLYCOL 3350 17 G PO PACK
17.0000 g | PACK | Freq: Two times a day (BID) | ORAL | Status: DC
  Administered 2015-07-27 – 2015-08-07 (×12): 17 g via ORAL
  Filled 2015-07-27 (×26): qty 1

## 2015-07-27 NOTE — Progress Notes (Signed)
Did not attend group 

## 2015-07-27 NOTE — Progress Notes (Signed)
DAR NOTE: Patient remained paranoid, guarded and suspicious.  Patient refused to attend group or participate in milieu.  Patient reports Trazodone causing too much sedation and would like the dosage reduced.  Patient states she would like to be more active during the day.  Patient seen by provider and medication adjusted.  Denies pain, auditory and visual hallucinations.   Maintained on routine safety checks.  Medications given as prescribed.  Support and encouragement offered as needed.  Patient encouraged to be visible in the milieu.  Patient only come out of her room for medication and to use the phone.  States goal for today is "to get out of here."

## 2015-07-27 NOTE — Progress Notes (Signed)
Patient ID: [name] [name], female   DOB: October 05, 1980, 35 y.o.   MRN: [id number] [facility] MD Progress Note  07/27/2015 11:36 AM [name] [name]  MRN:  [id number]   Subjective:  Patient is that she was tired this morning, reports that she is very sedated with the trazodone, has difficulty in getting her thoughts together. She also reports that she wants a different place to stay, wants resources  Objective:[name] [name] is awake, alert and oriented to person and person not time. Patient reports that she is really tired this morning due to the trazodone, has difficulty in getting her thoughts together, still does not want to participate in groups as she feels people will harm her. Patient states that she wants to live independently, once a list of resources as she is always fearful of people, does not like the place she lives in as this too much chaos.  Patient denies hearing any voices, but does report feeling fearful of other people around her. Patient contracts for safety on the unit and denies wanting to hurt anyone and states that's why she prefers to stay to herself. Patient adds that she will talk to the nurse if she has any safety issues    Principal Problem: Schizoaffective disorder, bipolar type ([place]) Diagnosis:   Patient Active Problem List   Diagnosis Date Noted  . Insomnia [G47.00]   . Anxiety state [F41.1]   . Overactive bladder [N32.81]   . Diabetes mellitus ([place]) [E11.9] 02/08/2015  . Schizoaffective disorder, bipolar type ([place]) [F25.0] 01/28/2015  . Non compliance w medication regimen [Z91.14]    Total Time spent with patient: 15 minutes  Past Psychiatric History: See Above  Past Medical History:  Past Medical History  Diagnosis Date  . Diabetes mellitus without complication ([place])   . Schizophrenia ([place])   . Bipolar affective disorder, currently manic, mild ([place])    History reviewed. No pertinent past surgical history. Family History:  Family History   Problem Relation Age of Onset  . Drug abuse Maternal Uncle     Social History:  History  Alcohol Use No     History  Drug Use Not on file    Social History   Social History  . Marital Status: Legally Separated    Spouse Name: N/A  . Number of Children: N/A  . Years of Education: N/A   Social History Main Topics  . Smoking status: Never Smoker   . Smokeless tobacco: None  . Alcohol Use: No  . Drug Use: None  . Sexual Activity: Not Asked   Other Topics Concern  . None   Social History Narrative   Additional Social History:    Sleep: Fair  Appetite:  Fair  Current Medications: Current Facility-Administered Medications  Medication Dose Route Frequency Provider Last Rate Last Dose  . acetaminophen (TYLENOL) tablet 650 mg  650 mg Oral Q6H PRN Patrecia Pour, NP   650 mg at 07/22/15 0956  . alum & mag hydroxide-simeth (MAALOX/MYLANTA) 200-200-20 MG/5ML suspension 30 mL  30 mL Oral Q4H PRN Patrecia Pour, NP      . benztropine (COGENTIN) tablet 0.5 mg  0.5 mg Oral BID Nanci Pina, FNP   0.5 mg at 07/27/15 0913  . divalproex (DEPAKOTE ER) 24 hr tablet 1,000 mg  1,000 mg Oral QHS Patrecia Pour, NP   1,000 mg at 07/26/15 2116  . hydrOXYzine (ATARAX/VISTARIL) tablet 25 mg  25 mg Oral TID PRN Patrecia Pour, NP   25 mg at  07/22/15 2144  . magnesium hydroxide (MILK OF MAGNESIA) suspension 30 mL  30 mL Oral Daily PRN Patrecia Pour, NP   30 mL at 07/27/15 0912  . metFORMIN (GLUCOPHAGE) tablet 500 mg  500 mg Oral Q breakfast Patrecia Pour, NP   500 mg at 07/27/15 0912  . oxybutynin (DITROPAN-XL) 24 hr tablet 5 mg  5 mg Oral QHS Patrecia Pour, NP   5 mg at 07/26/15 2116  . traZODone (DESYREL) tablet 50 mg  50 mg Oral QHS PRN Kerrie Buffalo, NP      . ziprasidone (GEODON) capsule 80 mg  80 mg Oral BID WC Nanci Pina, FNP   80 mg at 07/27/15 [id number]    Lab Results:  Results for orders placed or performed during the hospital encounter of 07/21/15 (from the past 48 hour(s))   Glucose, capillary     Status: None   Collection Time: 07/27/15  6:25 AM  Result Value Ref Range   Glucose-Capillary 98 65 - 99 mg/dL    Blood Alcohol level:  Lab Results  Component Value Date   ETH <5 07/21/2015   ETH <5 0000000    Metabolic Disorder Labs: Lab Results  Component Value Date   HGBA1C 5.6 07/25/2015   MPG 114 07/25/2015   MPG 137 02/01/2015   Lab Results  Component Value Date   PROLACTIN 46.5* 07/25/2015   PROLACTIN 21.6 02/01/2015   Lab Results  Component Value Date   CHOL 135 07/25/2015   TRIG 59 07/25/2015   HDL 50 07/25/2015   CHOLHDL 2.7 07/25/2015   VLDL 12 07/25/2015   LDLCALC 73 07/25/2015   LDLCALC 78 02/01/2015    Physical Findings: AIMS: Facial and Oral Movements Muscles of Facial Expression: None, normal Lips and Perioral Area: None, normal Jaw: None, normal Tongue: None, normal,Extremity Movements Upper (arms, wrists, hands, fingers): None, normal Lower (legs, knees, ankles, toes): None, normal, Trunk Movements Neck, shoulders, hips: None, normal, Overall Severity Severity of abnormal movements (highest score from questions above): None, normal Incapacitation due to abnormal movements: None, normal Patient's awareness of abnormal movements (rate only patient's report): No Awareness, Dental Status Current problems with teeth and/or dentures?: No Does patient usually wear dentures?: No  CIWA:    COWS:     Musculoskeletal: Strength & Muscle Tone: within normal limits Gait & Station: normal Patient leans: N/A  Psychiatric Specialty Exam: Physical Exam  Nursing note and vitals reviewed. Constitutional: She is oriented to person, place, and time.  Musculoskeletal: Normal range of motion.  Neurological: She is alert and oriented to person, place, and time.  Psychiatric: She has a normal mood and affect. Her behavior is normal.    Review of Systems  Constitutional: Negative.  Negative for fever and malaise/fatigue.  HENT:  Negative.  Negative for congestion and sore throat.   Eyes: Negative.  Negative for blurred vision, discharge and redness.  Respiratory: Negative.  Negative for cough, shortness of breath and wheezing.   Cardiovascular: Negative.  Negative for chest pain and palpitations.  Gastrointestinal: Negative.  Negative for heartburn, nausea, vomiting, abdominal pain, diarrhea and constipation.  Genitourinary: Negative.  Negative for dysuria and urgency.  Musculoskeletal: Negative.  Negative for myalgias and falls.  Skin: Negative.  Negative for rash.  Neurological: Negative.  Negative for dizziness, seizures, loss of consciousness, weakness and headaches.  Endo/Heme/Allergies: Negative.  Negative for environmental allergies.  Psychiatric/Behavioral: Positive for depression. Negative for suicidal ideas and substance abuse. The patient is nervous/anxious. The patient  does not have insomnia.   All other systems reviewed and are negative.   Blood pressure 106/77, pulse 88, temperature 98 F (36.7 C), temperature source Oral, resp. rate 16, height 5\' 4"  (1.626 m).There is no weight on file to calculate BMI.  General Appearance: Disheveled  Eye Contact:  Minimal  Speech:  Clear and Coherent  Volume:  Normal  Mood:  Depressed  Affect:  Flat and Restricted  Thought Process:  Coherent  Orientation:  Full (Time, Place, and Person)  Thought Content:  Paranoid Ideation and Rumination  Suicidal Thoughts:  No  Homicidal Thoughts:  No  Memory:  Immediate;   Fair Remote;   Fair  Judgement:  Intact  Insight:  Lacking  Psychomotor Activity:  Normal  Concentration:  Concentration: Poor  Recall:  Poor  Fund of Knowledge:  Good  Language:  Good  Akathisia:  No  Handed:  Right  AIMS (if indicated):     Assets:  Communication Skills Desire for Improvement Resilience  ADL's:  Intact  Cognition:  WNL  Sleep:  Number of Hours: 6.25     Treatment Plan Summary: Daily contact with patient to assess and  evaluate symptoms and progress in treatment and Medication management   Mood Stabilization: Depakote ER 1000 mg Q hsFor mood stabilization Continue Geodon 80 mg Bid for mood stabilization and psychosis. Discussed in length with patient the need for medication compliance in order for her to be psychiatrically stable outpatient. Also discussed her current stresses which include the place she lives at, feet 8 and overwhelmed but there are a lot of people around and the need for her to work on her coping skills, desensitization while here Decrease trazodone to 50 mg 1 at bedtime as needed for sleep Change Cogentin 0.5 mg po BID  PRN as patient has not had any EPS symptoms Continue Vistaril 25 mg TID prn for anxiety Continue Glucophage 500 mg daily for type 2 diabetes Continue Ditropan XL 5 mg daily for overactive bladder  Will continue to monitor vitals ,medication compliance and treatment side effects while patient is here.  CSW working on giving patient resources for places to live once patient gets her check  Patient to participate in therapeutic milieu  Hampton Abbot, MD

## 2015-07-27 NOTE — BHH Group Notes (Signed)
[place] LCSW Group Therapy  07/27/2015  1:05 PM  Type of Therapy:  Group therapy  Participation Level:  Active  Participation Quality:  Attentive  Affect:  Flat  Cognitive:  Oriented  Insight:  Limited  Engagement in Therapy:  Limited  Modes of Intervention:  Discussion, Socialization  Summary of Progress/Problems:  Chaplain was here to lead a group on themes of hope and courage.Invited.  Chose to not attend.  Roque Lias B 07/27/2015 1:08 PM

## 2015-07-27 NOTE — Tx Team (Signed)
Interdisciplinary Treatment Plan Update (Adult) Date: 07/27/2015   Date: 07/27/2015 8:35 AM  Progress in Treatment:  Attending groups: No Participating in groups: No Taking medication as prescribed: Yes  Tolerating medication: Yes  Family/Significant othe contact made: No, CSW attempting to make contact with family Patient understands diagnosis: Limited insight Discussing patient identified problems/goals with staff: Yes  Medical problems stabilized or resolved: Yes  Denies suicidal/homicidal ideation: Yes Patient has not harmed self or Others: Yes   New problem(s) identified: None identified at this time.   Discharge Plan or Barriers: Pt will return home and follow-up with outpatient services.   Additional comments: 07/27/15: Mood Stabilization: Depakote ER 1000 mg QHS Continue Geodon 80 mg Bid for mood stabilization and psychosis. Discussed in length with patient the need for medication compliance in order for her to be psychiatrically stable outpatient. Also discussed her current stresses which include the place she lives at, feeling overwhelmed because there are a lot of people around and the need for her to work on her coping skills, desensitization while here Decrease trazodone to 50 mg 1 at bedtime as needed for sleep Change Cogentin 0.5 mg po BID PRN as patient has not had any EPS symptoms  Reason for Continuation of Hospitalization:  Medication stabilization Psychosis Paranoia  Estimated length of stay: 3-5 days  Review of initial/current patient goals per problem list:   1.  Goal(s): Patient will participate in aftercare plan  Met:  Yes  Target date: 3-5 days from date of admission   As evidenced by: Patient will participate within aftercare plan AEB aftercare provider and housing plan at discharge being identified.   07/23/15: Pt will return home and follow-up with outpatient services.  5.  Goal(s): Patient will demonstrate decreased signs of psychosis  . Met:   No . Target date: 3-5 days from date of admission  . As evidenced by: Patient will demonstrate decreased frequency of AVH or return to baseline function   07/23/15: Pt continues to exhibit signs of AVH and is guarded with staff, reports feeling that people are    "out to get her."   07/27/15:  Continues to isolate in her room, endorses paranoia  Attendees:  Patient:    Family:    Physician: Dr. Dwyane Dee, MD  07/27/2015 8:35 AM  Nursing: Hedy Jacob, RN  07/27/2015 8:35 AM  Clinical Social Worker Rod [place]   07/27/2015 8:35 AM  Other:  07/27/2015 8:35 AM  Clinical: Lars Pinks, RN Case manager  07/27/2015 8:35 AM  Other:  07/27/2015 8:35 AM  Other:     Ripley Fraise

## 2015-07-27 NOTE — Progress Notes (Signed)
Pt has remained in her room all evening, but she told writer that she had been up some during the day.  Pt denies SI/HI/AVH.  She still presents irritable and unwilling to get up and participate in her treatment.  She has not made any delusional statements to writer this shift, but conversation with patient is very minimal as she wishes for staff to leave her alone.  She keeps her eyes closed when talking to Probation officer.  Pt was encouraged to make her needs known.  When writer took her medications to her this evening, she specifically asked for the Trazodone, even though she has complained that it makes her too sleepy during the day.  Pt was given the Trazodone 50mg  with her hs meds as requested.  Support and encouragement offered.  Discharge plans are in process.  Safety maintained with q15 minute checks.

## 2015-07-27 NOTE — Progress Notes (Signed)
Pt has remained in her room in bed all evening.  Pt states that she continues to be too sleepy all the time, and attributes her drowsiness to her medication.  Pt denies SI/HI/AVH.  Pt states she is too sleepy to participate in groups.  Writer assured pt a note would be put in for the MD to review her meds to see if something could be decreased.   Pt was appreciative.  Much of the time, pt is irritable and critical of staff.  Support and encouragement offered.  Discharge plans are in process.  Safety maintained with q15 minute checks.

## 2015-07-28 NOTE — Progress Notes (Signed)
Patient has been isolative to room this shift. Patient did not engage in any unit activities stating that she is not here for groups she got all she needs already.  Patient did not answer any of writers questions nor did she complete her patient inventory form this shift. Patient has not exhibited any unsafe behaviors but chooses to remain isolative and electively mute.    Assess patient for safety, offer patient medications as prescribed, engage patient in 1:1 therapeutic talks as tolerated.  Continue to monitor

## 2015-07-28 NOTE — Progress Notes (Signed)
Pt was up around 0115 c/o nausea.  Pt was given ginger ale, then writer suggested Maalox which pt took.  At this time, pt is in the bed asleep.  No distress observed.  Pt remains safe with q15 minute checks.

## 2015-07-28 NOTE — Progress Notes (Signed)
D.  Pt has remained in group for duration of shift thus far.  Minimal interaction.  Pt did not attend evening wrap up group. No complaints voiced, took medication as ordered  A.  Support and encouragement offered, medication given as ordered  R.  Pt remains safe on the unit, will continue to monitor.

## 2015-07-28 NOTE — Progress Notes (Signed)
Did not attend group 

## 2015-07-28 NOTE — Progress Notes (Signed)
[facility] MD Progress Note  07/28/2015 3:22 PM Sherlonda Gallman  MRN:  [id number]   Subjective:  Patient stayed in bed most of the day.  She remains paranoid.  She states that she is "a soldier of God.  I'm always going to be this way."  Objective  :Darika Stubenrauch remains paranoid today.  She was worried about attending groups for fear that someone is out to get her.  She states that she is not as sleepy.  Trazodone was decreased to 50 mg and no repeat dose.  She contracts for safety on unit.  She has good rapport with nurse staff.  Principal Problem: Schizoaffective disorder, bipolar type ([place]) Diagnosis:   Patient Active Problem List   Diagnosis Date Noted  . Insomnia [G47.00]   . Anxiety state [F41.1]   . Overactive bladder [N32.81]   . Diabetes mellitus ([place]) [E11.9] 02/08/2015  . Schizoaffective disorder, bipolar type ([place]) [F25.0] 01/28/2015  . Non compliance w medication regimen [Z91.14]    Total Time spent with patient: 15 minutes  Past Psychiatric History: See Above  Past Medical History:  Past Medical History  Diagnosis Date  . Diabetes mellitus without complication ([place])   . Schizophrenia ([place])   . Bipolar affective disorder, currently manic, mild ([place])    History reviewed. No pertinent past surgical history. Family History:  Family History  Problem Relation Age of Onset  . Drug abuse Maternal Uncle     Social History:  History  Alcohol Use No     History  Drug Use Not on file    Social History   Social History  . Marital Status: Legally Separated    Spouse Name: N/A  . Number of Children: N/A  . Years of Education: N/A   Social History Main Topics  . Smoking status: Never Smoker   . Smokeless tobacco: None  . Alcohol Use: No  . Drug Use: None  . Sexual Activity: Not Asked   Other Topics Concern  . None   Social History Narrative   Additional Social History:    Sleep: Fair  Appetite:  Fair  Current Medications: Current  Facility-Administered Medications  Medication Dose Route Frequency Provider Last Rate Last Dose  . acetaminophen (TYLENOL) tablet 650 mg  650 mg Oral Q6H PRN Patrecia Pour, NP   650 mg at 07/22/15 0956  . alum & mag hydroxide-simeth (MAALOX/MYLANTA) 200-200-20 MG/5ML suspension 30 mL  30 mL Oral Q4H PRN Patrecia Pour, NP   30 mL at 07/28/15 0121  . benztropine (COGENTIN) tablet 0.5 mg  0.5 mg Oral BID PRN Hampton Abbot, MD      . divalproex (DEPAKOTE ER) 24 hr tablet 1,000 mg  1,000 mg Oral QHS Patrecia Pour, NP   1,000 mg at 07/27/15 2111  . docusate sodium (COLACE) capsule 100 mg  100 mg Oral Daily Kerrie Buffalo, NP   100 mg at 07/27/15 1500  . hydrOXYzine (ATARAX/VISTARIL) tablet 25 mg  25 mg Oral TID PRN Patrecia Pour, NP   25 mg at 07/22/15 2144  . magnesium hydroxide (MILK OF MAGNESIA) suspension 30 mL  30 mL Oral Daily PRN Patrecia Pour, NP   30 mL at 07/27/15 0912  . metFORMIN (GLUCOPHAGE) tablet 500 mg  500 mg Oral Q breakfast Patrecia Pour, NP   500 mg at 07/27/15 0912  . oxybutynin (DITROPAN-XL) 24 hr tablet 5 mg  5 mg Oral QHS Patrecia Pour, NP   5 mg at 07/27/15 2111  .  polyethylene glycol (MIRALAX / GLYCOLAX) packet 17 g  17 g Oral BID Kerrie Buffalo, NP   17 g at 07/27/15 1727  . traZODone (DESYREL) tablet 50 mg  50 mg Oral QHS PRN Kerrie Buffalo, NP   50 mg at 07/27/15 2114  . ziprasidone (GEODON) capsule 80 mg  80 mg Oral BID WC Nanci Pina, FNP   80 mg at 07/27/15 1727    Lab Results:  Results for orders placed or performed during the hospital encounter of 07/21/15 (from the past 48 hour(s))  Glucose, capillary     Status: None   Collection Time: 07/27/15  6:25 AM  Result Value Ref Range   Glucose-Capillary 98 65 - 99 mg/dL    Blood Alcohol level:  Lab Results  Component Value Date   ETH <5 07/21/2015   ETH <5 0000000    Metabolic Disorder Labs: Lab Results  Component Value Date   HGBA1C 5.6 07/25/2015   MPG 114 07/25/2015   MPG 137 02/01/2015    Lab Results  Component Value Date   PROLACTIN 46.5* 07/25/2015   PROLACTIN 21.6 02/01/2015   Lab Results  Component Value Date   CHOL 135 07/25/2015   TRIG 59 07/25/2015   HDL 50 07/25/2015   CHOLHDL 2.7 07/25/2015   VLDL 12 07/25/2015   LDLCALC 73 07/25/2015   LDLCALC 78 02/01/2015    Physical Findings: AIMS: Facial and Oral Movements Muscles of Facial Expression: None, normal Lips and Perioral Area: None, normal Jaw: None, normal Tongue: None, normal,Extremity Movements Upper (arms, wrists, hands, fingers): None, normal Lower (legs, knees, ankles, toes): None, normal, Trunk Movements Neck, shoulders, hips: None, normal, Overall Severity Severity of abnormal movements (highest score from questions above): None, normal Incapacitation due to abnormal movements: None, normal Patient's awareness of abnormal movements (rate only patient's report): No Awareness, Dental Status Current problems with teeth and/or dentures?: No Does patient usually wear dentures?: No  CIWA:    COWS:     Musculoskeletal: Strength & Muscle Tone: within normal limits Gait & Station: normal Patient leans: N/A  Psychiatric Specialty Exam: Physical Exam  Nursing note and vitals reviewed. Constitutional: She is oriented to person, place, and time.  Musculoskeletal: Normal range of motion.  Neurological: She is alert and oriented to person, place, and time.  Psychiatric: She has a normal mood and affect. Her behavior is normal. Thought content is paranoid. She is noncommunicative.    Review of Systems  Constitutional: Negative.  Negative for fever and malaise/fatigue.  HENT: Negative.  Negative for congestion and sore throat.   Eyes: Negative.  Negative for blurred vision, discharge and redness.  Respiratory: Negative.  Negative for cough, shortness of breath and wheezing.   Cardiovascular: Negative.  Negative for chest pain and palpitations.  Gastrointestinal: Negative.  Negative for  heartburn, nausea, vomiting, abdominal pain, diarrhea and constipation.  Genitourinary: Negative.  Negative for dysuria and urgency.  Musculoskeletal: Negative.  Negative for myalgias and falls.  Skin: Negative.  Negative for rash.  Neurological: Negative.  Negative for dizziness, seizures, loss of consciousness, weakness and headaches.  Endo/Heme/Allergies: Negative.  Negative for environmental allergies.  Psychiatric/Behavioral: Positive for depression. Negative for suicidal ideas and substance abuse. The patient is nervous/anxious. The patient does not have insomnia.   All other systems reviewed and are negative.   Blood pressure 106/77, pulse 88, temperature 98 F (36.7 C), temperature source Oral, resp. rate 16, height 5\' 4"  (1.626 m).There is no weight on file to calculate BMI.  General Appearance: Disheveled  Eye Contact:  Minimal  Speech:  Clear and Coherent  Volume:  Normal  Mood:  Depressed  Affect:  Flat and Restricted  Thought Process:  Coherent  Orientation:  Full (Time, Place, and Person)  Thought Content:  Paranoid Ideation and Rumination  Suicidal Thoughts:  No  Homicidal Thoughts:  No  Memory:  Immediate;   Fair Remote;   Fair  Judgement:  Intact  Insight:  Lacking  Psychomotor Activity:  Normal  Concentration:  Concentration: Poor  Recall:  Poor  Fund of Knowledge:  Good  Language:  Good  Akathisia:  No  Handed:  Right  AIMS (if indicated):     Assets:  Communication Skills Desire for Improvement Resilience  ADL's:  Intact  Cognition:  WNL  Sleep:  Number of Hours: 4     Treatment Plan Summary: Daily contact with patient to assess and evaluate symptoms and progress in treatment and Medication management   Mood Stabilization:  Depakote ER 1000 mg Q hs For mood stabilization Continue Geodon 80 mg Bid for mood stabilization and psychosis. Discussed in length with patient the need for medication compliance in order for her to be psychiatrically stable  outpatient. Also discussed her current stresses which include the place she lives at, feet 8 and overwhelmed but there are a lot of people around and the need for her to work on her coping skills, desensitization while here Cont  Trazodone to 50 mg 1 at bedtime as needed for sleep Change Cogentin 0.5 mg po BID  PRN as patient has not had any EPS symptoms Continue Vistaril 25 mg TID prn for anxiety Continue Glucophage 500 mg daily for type 2 diabetes Continue Ditropan XL 5 mg daily for overactive bladder  Will continue to monitor vitals ,medication compliance and treatment side effects while patient is here.  CSW working on giving patient resources for places to live once patient gets her check  Patient to participate in therapeutic milieu  Sheila May Agustin, NP [facility]   Reviewed the information documented and agree with the treatment plan.  Ayona Yniguez 07/29/2015 11:50 AM

## 2015-07-28 NOTE — BHH Group Notes (Signed)
[facility] Group Notes: (Clinical Social Work)   07/28/2015      Type of Therapy:  Group Therapy   Participation Level:  Did Not Attend despite MHT prompting   Selmer Dominion, LCSW 07/28/2015, 1:08 PM

## 2015-07-29 MED ORDER — NAPROXEN 500 MG PO TABS
500.0000 mg | ORAL_TABLET | Freq: Two times a day (BID) | ORAL | Status: DC | PRN
  Administered 2015-07-29 (×2): 500 mg via ORAL
  Filled 2015-07-29 (×2): qty 1

## 2015-07-29 MED ORDER — ADULT MULTIVITAMIN W/MINERALS CH
1.0000 | ORAL_TABLET | Freq: Every day | ORAL | Status: DC
  Administered 2015-07-29 – 2015-08-07 (×9): 1 via ORAL
  Filled 2015-07-29 (×13): qty 1

## 2015-07-29 NOTE — BHH Group Notes (Signed)
[facility] Group Notes: (Clinical Social Work)   07/29/2015      Type of Therapy:  Group Therapy   Participation Level:  Did Not Attend despite MHT prompting   Selmer Dominion, LCSW 07/29/2015, 12:16 PM
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
## 2015-07-29 NOTE — [facility] Group Notes (Signed)
[facility] Group Notes:  (Nursing/MHT/Case Management/Adjunct)  Date:  07/29/2015  Time:  3:48 PM  Type of Therapy:  Psychoeducational Skills  Participation Level:  Did Not Attend  Participation Quality:  Did Not Attend  Affect:  Did Not Attend  Cognitive:  Did Not Attend  Insight:  None  Engagement in Group:  Did Not Attend  Modes of Intervention:  Did Not Attend  Summary of Progress/Problems: Pt did not attend patient self inventory group.   Benancio Deeds Shanta 07/29/2015, 3:48 PM

## 2015-07-29 NOTE — Progress Notes (Signed)
D.  Pt pleasant on approach, does make statements that appear paranoid.  Pt asked repeatedly if we did experiments on people here.  Pt also asked if medication could be used to experiment on her and stated "I don't want any experiments done on me".  Pt did attend evening wrap up group, see group notes.  Pt had minimal interaction with peers on unit.  Pt denies SI/HI/hallucinations but did appear to mention auditory hallucinations at time stating that God was speaking to her.  A.  Support and encouragement offered, assured Pt of her safety.  Medications given as ordered.  R.  Pt remains safe on the unit, will continue to monitor.

## 2015-07-29 NOTE — Plan of Care (Signed)
Problem: Activity: Goal: Interest or engagement in activities will improve Outcome: Progressing Pt did attend evening wrap up group tonight

## 2015-07-29 NOTE — Progress Notes (Signed)
Nursing Note 07/29/2015 [id number]  Data Reports sleeping well wwithout PRN sleep med.  C/O of feeling tired all the time "I don't like this Geodon."  Patient took medicine this AM with no issues or argument.  Pleasant and cooperative with nurse during med pass. C/O back pain and foot/hand pain, requested "something other than tylenol."  Requested a daily multivitamin.  Requested Education officer, museum.  During conversation mentioned "one time I slept with my bible next to my head and it was screaming 'do not complain' at me.  I don't sleep with my bible next to my head." Rates depression 2/10, hopelessness 1/10, and anxiety 2/10. Affect blunted but appropriate.  Denies HI, SI, AVH.   On goals sheet mentions wanting to "work on attitude" and "increase Depakote."  Spent most of day in room but no verbal outbursts observed.  Action Spoke with patient 1:1, nurse offered support to patient throughout shift.  Spoke with covering provider who ordered PRN naproxen and daily multivitamin.  Notified provider of patient's complaints.  Notified social worker patient wanted to speak with Education officer, museum. Continues to be monitored on 15 minute checks for safety.  Response Naproxen effective.  SW spoke with patient.  Patient spoke 1:1 with covering weekend provider regarding issues noted above.

## 2015-07-29 NOTE — Progress Notes (Signed)
Adult Psychoeducational Group Note  Date:  07/29/2015 Time:  8:15 PM  Group Topic/Focus:  Wrap-Up Group:   The focus of this group is to help patients review their daily goal of treatment and discuss progress on daily workbooks.   Participation Level:  Active  Participation Quality:  Appropriate and Inattentive  Affect:  Flat  Cognitive:  Appropriate  Insight: Appropriate  Engagement in Group:  Poor  Modes of Intervention:  Discussion  Additional Comments:  Pt rated her overall day a 10 out of 10 and stated that her day was "awesome". Pt reported that she achieved her goals for the day, but did not go into detail as to what her goals were. Pt did not make eye contact with anyone during group.    Lincoln Brigham 07/29/2015, 8:22 PM

## 2015-07-29 NOTE — Progress Notes (Signed)
Pt refused CBG this AM 

## 2015-07-29 NOTE — Progress Notes (Signed)
[facility] MD Progress Note  07/29/2015 1:05 PM [name] [name]  MRN:  [id number]   Subjective:  Patient spoke with this NP in a calm manner.  She states that she was not ready to go home this past Friday and that she needed more help.    Objective:   [name] [name] is alert, oriented today.  She states that her medications are finally working well.  She still notes that the Geodon makes her sleepy so she would like to see if taking Trazodone 50 mg and deferring on a second 50 mg would help her somnolence.  She would like to find out when she will be going home.  She contracts for safety on unit.  She has good rapport with nurse staff.  Principal Problem: Schizoaffective disorder, bipolar type ([place]) Diagnosis:   Patient Active Problem List   Diagnosis Date Noted  . Insomnia [G47.00]   . Anxiety state [F41.1]   . Overactive bladder [N32.81]   . Diabetes mellitus ([place]) [E11.9] 02/08/2015  . Schizoaffective disorder, bipolar type ([place]) [F25.0] 01/28/2015  . Non compliance w medication regimen [Z91.14]    Total Time spent with patient: 15 minutes  Past Psychiatric History: See Above  Past Medical History:  Past Medical History:  Diagnosis Date  . Bipolar affective disorder, currently manic, mild ([place])   . Diabetes mellitus without complication ([place])   . Schizophrenia ([place])    History reviewed. No pertinent surgical history. Family History:  Family History  Problem Relation Age of Onset  . Drug abuse Maternal Uncle     Social History:  History  Alcohol Use No     History  Drug use: Unknown    Social History   Social History  . Marital status: Legally Separated    Spouse name: N/A  . Number of children: N/A  . Years of education: N/A   Social History Main Topics  . Smoking status: Never Smoker  . Smokeless tobacco: None  . Alcohol use No  . Drug use: Unknown  . Sexual activity: Not Asked   Other Topics Concern  . None   Social History Narrative  . None    Additional Social History:    Sleep: Fair  Appetite:  Fair  Current Medications: Current Facility-Administered Medications  Medication Dose Route Frequency Provider Last Rate Last Dose  . acetaminophen (TYLENOL) tablet 650 mg  650 mg Oral Q6H PRN Patrecia Pour, NP   650 mg at 07/22/15 0956  . alum & mag hydroxide-simeth (MAALOX/MYLANTA) 200-200-20 MG/5ML suspension 30 mL  30 mL Oral Q4H PRN Patrecia Pour, NP   30 mL at 07/28/15 0121  . benztropine (COGENTIN) tablet 0.5 mg  0.5 mg Oral BID PRN Hampton Abbot, MD      . divalproex (DEPAKOTE ER) 24 hr tablet 1,000 mg  1,000 mg Oral QHS Patrecia Pour, NP   1,000 mg at 07/28/15 2102  . docusate sodium (COLACE) capsule 100 mg  100 mg Oral Daily Kerrie Buffalo, NP   100 mg at 07/29/15 0744  . hydrOXYzine (ATARAX/VISTARIL) tablet 25 mg  25 mg Oral TID PRN Patrecia Pour, NP   25 mg at 07/22/15 2144  . magnesium hydroxide (MILK OF MAGNESIA) suspension 30 mL  30 mL Oral Daily PRN Patrecia Pour, NP   30 mL at 07/27/15 0912  . metFORMIN (GLUCOPHAGE) tablet 500 mg  500 mg Oral Q breakfast Patrecia Pour, NP   500 mg at 07/29/15 0743  . multivitamin with  minerals tablet 1 tablet  1 tablet Oral Daily Kerrie Buffalo, NP   1 tablet at 07/29/15 0950  . naproxen (NAPROSYN) tablet 500 mg  500 mg Oral Q12H PRN Kerrie Buffalo, NP   500 mg at 07/29/15 0949  . oxybutynin (DITROPAN-XL) 24 hr tablet 5 mg  5 mg Oral QHS Patrecia Pour, NP   5 mg at 07/28/15 2102  . polyethylene glycol (MIRALAX / GLYCOLAX) packet 17 g  17 g Oral BID Kerrie Buffalo, NP   17 g at 07/29/15 0744  . traZODone (DESYREL) tablet 50 mg  50 mg Oral QHS PRN Kerrie Buffalo, NP   50 mg at 07/28/15 2102  . ziprasidone (GEODON) capsule 80 mg  80 mg Oral BID WC Nanci Pina, FNP   80 mg at 07/29/15 [id number]    Lab Results:  No results found for this or any previous visit (from the past 48 hour(s)).  Blood Alcohol level:  Lab Results  Component Value Date   ETH <5 07/21/2015   ETH <5  0000000    Metabolic Disorder Labs: Lab Results  Component Value Date   HGBA1C 5.6 07/25/2015   MPG 114 07/25/2015   MPG 137 02/01/2015   Lab Results  Component Value Date   PROLACTIN 46.5 (H) 07/25/2015   PROLACTIN 21.6 02/01/2015   Lab Results  Component Value Date   CHOL 135 07/25/2015   TRIG 59 07/25/2015   HDL 50 07/25/2015   CHOLHDL 2.7 07/25/2015   VLDL 12 07/25/2015   LDLCALC 73 07/25/2015   LDLCALC 78 02/01/2015    Physical Findings: AIMS: Facial and Oral Movements Muscles of Facial Expression: None, normal Lips and Perioral Area: None, normal Jaw: None, normal Tongue: None, normal,Extremity Movements Upper (arms, wrists, hands, fingers): None, normal Lower (legs, knees, ankles, toes): None, normal, Trunk Movements Neck, shoulders, hips: None, normal, Overall Severity Severity of abnormal movements (highest score from questions above): None, normal Incapacitation due to abnormal movements: None, normal Patient's awareness of abnormal movements (rate only patient's report): No Awareness, Dental Status Current problems with teeth and/or dentures?: No Does patient usually wear dentures?: No  CIWA:    COWS:     Musculoskeletal: Strength & Muscle Tone: within normal limits Gait & Station: normal Patient leans: N/A  Psychiatric Specialty Exam: Physical Exam  Nursing note and vitals reviewed. Constitutional: She is oriented to person, place, and time.  Musculoskeletal: Normal range of motion.  Neurological: She is alert and oriented to person, place, and time.  Psychiatric: She has a normal mood and affect. Her behavior is normal. Thought content is paranoid. She is noncommunicative.    Review of Systems  Constitutional: Negative.  Negative for fever and malaise/fatigue.  HENT: Negative.  Negative for congestion and sore throat.   Eyes: Negative.  Negative for blurred vision, discharge and redness.  Respiratory: Negative.  Negative for cough, shortness  of breath and wheezing.   Cardiovascular: Negative.  Negative for chest pain and palpitations.  Gastrointestinal: Negative.  Negative for abdominal pain, constipation, diarrhea, heartburn, nausea and vomiting.  Genitourinary: Negative.  Negative for dysuria and urgency.  Musculoskeletal: Negative.  Negative for falls and myalgias.  Skin: Negative.  Negative for rash.  Neurological: Negative.  Negative for dizziness, seizures, loss of consciousness, weakness and headaches.  Endo/Heme/Allergies: Negative.  Negative for environmental allergies.  Psychiatric/Behavioral: Positive for depression. Negative for substance abuse and suicidal ideas. The patient is nervous/anxious. The patient does not have insomnia.   All other systems reviewed  and are negative.   Blood pressure 103/61, pulse (!) 103, temperature 98.7 F (37.1 C), temperature source Oral, resp. rate 18, height 5\' 4"  (1.626 m).There is no height or weight on file to calculate BMI.  General Appearance: Disheveled  Eye Contact:  Minimal  Speech:  Clear and Coherent  Volume:  Normal  Mood:  Depressed  Affect:  Flat and Restricted  Thought Process:  Coherent  Orientation:  Full (Time, Place, and Person)  Thought Content:  Paranoid Ideation and Rumination  Suicidal Thoughts:  No  Homicidal Thoughts:  No  Memory:  Immediate;   Fair Remote;   Fair  Judgement:  Intact  Insight:  Lacking  Psychomotor Activity:  Normal  Concentration:  Concentration: Poor  Recall:  Poor  Fund of Knowledge:  Good  Language:  Good  Akathisia:  No  Handed:  Right  AIMS (if indicated):     Assets:  Communication Skills Desire for Improvement Resilience  ADL's:  Intact  Cognition:  WNL  Sleep:  Number of Hours: 6.75     Treatment Plan Summary: Daily contact with patient to assess and evaluate symptoms and progress in treatment and Medication management   Mood Stabilization:  Depakote ER 1000 mg Q hs For mood stabilization Continue Geodon 80  mg Bid for mood stabilization and psychosis.  Patient states that Geodon was making her sleepy. This is not new.  Encouraged med compliance to help with mood stabilization.  Encouraged better sleep hygiene.   Cont  Trazodone to 50 mg 1 at bedtime as needed for sleep Change Cogentin 0.5 mg po BID  PRN as patient has not had any EPS symptoms Continue Vistaril 25 mg TID prn for anxiety Continue Glucophage 500 mg daily for type 2 diabetes Continue Ditropan XL 5 mg daily for overactive bladder  Will continue to monitor vitals ,medication compliance and treatment side effects while patient is here.  CSW working on giving patient resources for places to live once patient gets her check - today She discussed that she would like to go [place] for outpatient.  She also would like help to pay her utility bill.  Informed patient of resources such as [employer] or [employer] that may offer assistance.   Patient to participate in therapeutic milieu  Sheila May Agustin, NP [facility]  Reviewed the information documented and agree with the treatment plan.  Remmington Teters 07/29/2015 3:15 PM

## 2015-07-29 NOTE — Plan of Care (Signed)
Problem: Education: Goal: Mental status will improve Outcome: Progressing No outbursts or verbal aggression today.  Problem: Coping: Goal: Ability to verbalize frustrations and anger appropriately will improve Outcome: Progressing No outbursts or verbal aggression today.
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
## 2015-07-29 NOTE — Progress Notes (Signed)
Patient initially refused Geodon, stated "the doctor's trying to kill me." Nurse reassured patient, but patient continued to refuse.  Patient approached nurse shortly after stating "the Lord told me if I don't take my medicine it will be detrimental to me."  She then took her Geodon but declined the Miralax.

## 2015-07-30 LAB — GLUCOSE, CAPILLARY: GLUCOSE-CAPILLARY: 86 mg/dL (ref 65–99)

## 2015-07-30 MED ORDER — ZIPRASIDONE HCL 60 MG PO CAPS
60.0000 mg | ORAL_CAPSULE | Freq: Two times a day (BID) | ORAL | Status: DC
  Administered 2015-07-30 – 2015-07-31 (×2): 60 mg via ORAL
  Filled 2015-07-30 (×6): qty 1

## 2015-07-30 MED ORDER — ARIPIPRAZOLE 5 MG PO TABS
5.0000 mg | ORAL_TABLET | Freq: Every day | ORAL | Status: DC
  Filled 2015-07-30 (×3): qty 1

## 2015-07-30 NOTE — Progress Notes (Signed)
[facility] MD Progress Note  07/30/2015 2:54 PM Healy Furtak  MRN:  [id number]   Subjective:  Patient states " I am not sure why I am here. I still feel paranoid and still feel my water is poisoned . But I do not have any other problems. '  Objective: Patient seen and chart reviewed.Discussed patient with treatment team.    [name] is alert, oriented today. [place] continues to be pressured , tangential , has poor eye contact and appears to be delusional and paranoid.  She reports that geodon at higher doses has ADRs like sedation and EPS . She is willing to try Abilify and may be start an LAI if she is able to have an ACTT. She continues to feel depressed and wonders what will help. Pt however has limited insight in to her illness and wants to get discharged as soon and possible. Pt this AM was seen walking around with her IVC petition copy - asking whether her status on that can be changed to out patient. Per staff - pt continues to be psychotic and disorganized and is preoccupied with feeling too sedated on her geodon.  Principal Problem: Schizoaffective disorder, bipolar type ([place]) Diagnosis:   Patient Active Problem List   Diagnosis Date Noted  . Insomnia [G47.00]   . Anxiety state [F41.1]   . Overactive bladder [N32.81]   . Diabetes mellitus ([place]) [E11.9] 02/08/2015  . Schizoaffective disorder, bipolar type ([place]) [F25.0] 01/28/2015  . Non compliance w medication regimen [Z91.14]    Total Time spent with patient: 25 minutes  Past Psychiatric History: See Above  Past Medical History:  Past Medical History:  Diagnosis Date  . Bipolar affective disorder, currently manic, mild ([place])   . Diabetes mellitus without complication ([place])   . Schizophrenia ([place])    History reviewed. No pertinent surgical history. Family History:  Family History  Problem Relation Age of Onset  . Drug abuse Maternal Uncle     Social History:  History  Alcohol Use No     History   Drug use: Unknown    Social History   Social History  . Marital status: Legally Separated    Spouse name: N/A  . Number of children: N/A  . Years of education: N/A   Social History Main Topics  . Smoking status: Never Smoker  . Smokeless tobacco: None  . Alcohol use No  . Drug use: Unknown  . Sexual activity: Not Asked   Other Topics Concern  . None   Social History Narrative  . None   Additional Social History:    Sleep: Fair  Appetite:  Fair  Current Medications: Current Facility-Administered Medications  Medication Dose Route Frequency Provider Last Rate Last Dose  . acetaminophen (TYLENOL) tablet 650 mg  650 mg Oral Q6H PRN Patrecia Pour, NP   650 mg at 07/22/15 0956  . alum & mag hydroxide-simeth (MAALOX/MYLANTA) 200-200-20 MG/5ML suspension 30 mL  30 mL Oral Q4H PRN Patrecia Pour, NP   30 mL at 07/28/15 0121  . ARIPiprazole (ABILIFY) tablet 5 mg  5 mg Oral QHS Izaih Kataoka, MD      . benztropine (COGENTIN) tablet 0.5 mg  0.5 mg Oral BID PRN Hampton Abbot, MD      . divalproex (DEPAKOTE ER) 24 hr tablet 1,000 mg  1,000 mg Oral QHS Patrecia Pour, NP   1,000 mg at 07/29/15 2054  . docusate sodium (COLACE) capsule 100 mg  100 mg Oral Daily Kerrie Buffalo,  NP   100 mg at 07/30/15 0827  . hydrOXYzine (ATARAX/VISTARIL) tablet 25 mg  25 mg Oral TID PRN Patrecia Pour, NP   25 mg at 07/22/15 2144  . magnesium hydroxide (MILK OF MAGNESIA) suspension 30 mL  30 mL Oral Daily PRN Patrecia Pour, NP   30 mL at 07/27/15 0912  . metFORMIN (GLUCOPHAGE) tablet 500 mg  500 mg Oral Q breakfast Patrecia Pour, NP   500 mg at 07/30/15 0827  . multivitamin with minerals tablet 1 tablet  1 tablet Oral Daily Kerrie Buffalo, NP   1 tablet at 07/30/15 0827  . naproxen (NAPROSYN) tablet 500 mg  500 mg Oral Q12H PRN Kerrie Buffalo, NP   500 mg at 07/29/15 2054  . oxybutynin (DITROPAN-XL) 24 hr tablet 5 mg  5 mg Oral QHS Patrecia Pour, NP   5 mg at 07/29/15 2054  . polyethylene glycol  (MIRALAX / GLYCOLAX) packet 17 g  17 g Oral BID Kerrie Buffalo, NP   17 g at 07/30/15 0826  . traZODone (DESYREL) tablet 50 mg  50 mg Oral QHS PRN Kerrie Buffalo, NP   50 mg at 07/29/15 2054  . ziprasidone (GEODON) capsule 60 mg  60 mg Oral BID WC Ursula Alert, MD        Lab Results:  Results for orders placed or performed during the hospital encounter of 07/21/15 (from the past 48 hour(s))  Glucose, capillary     Status: None   Collection Time: 07/30/15  6:08 AM  Result Value Ref Range   Glucose-Capillary 86 65 - 99 mg/dL    Blood Alcohol level:  Lab Results  Component Value Date   ETH <5 07/21/2015   ETH <5 0000000    Metabolic Disorder Labs: Lab Results  Component Value Date   HGBA1C 5.6 07/25/2015   MPG 114 07/25/2015   MPG 137 02/01/2015   Lab Results  Component Value Date   PROLACTIN 46.5 (H) 07/25/2015   PROLACTIN 21.6 02/01/2015   Lab Results  Component Value Date   CHOL 135 07/25/2015   TRIG 59 07/25/2015   HDL 50 07/25/2015   CHOLHDL 2.7 07/25/2015   VLDL 12 07/25/2015   LDLCALC 73 07/25/2015   LDLCALC 78 02/01/2015    Physical Findings: AIMS: Facial and Oral Movements Muscles of Facial Expression: None, normal Lips and Perioral Area: None, normal Jaw: None, normal Tongue: None, normal,Extremity Movements Upper (arms, wrists, hands, fingers): None, normal Lower (legs, knees, ankles, toes): None, normal, Trunk Movements Neck, shoulders, hips: None, normal, Overall Severity Severity of abnormal movements (highest score from questions above): None, normal Incapacitation due to abnormal movements: None, normal Patient's awareness of abnormal movements (rate only patient's report): No Awareness, Dental Status Current problems with teeth and/or dentures?: No Does patient usually wear dentures?: No  CIWA:    COWS:     Musculoskeletal: Strength & Muscle Tone: within normal limits Gait & Station: normal Patient leans: N/A  Psychiatric Specialty  Exam: Physical Exam  Nursing note and vitals reviewed. Constitutional: She is oriented to person, place, and time.  Musculoskeletal: Normal range of motion.  Neurological: She is alert and oriented to person, place, and time.  Psychiatric: She has a normal mood and affect. Her behavior is normal. Thought content is paranoid. She is noncommunicative.    Review of Systems  Constitutional: Negative for fever and malaise/fatigue.  HENT: Negative for congestion and sore throat.   Eyes: Negative for blurred vision, discharge and redness.  Respiratory: Negative for cough, shortness of breath and wheezing.   Cardiovascular: Negative for chest pain and palpitations.  Gastrointestinal: Negative for abdominal pain, constipation, diarrhea, heartburn, nausea and vomiting.  Genitourinary: Negative for dysuria and urgency.  Musculoskeletal: Negative for falls and myalgias.  Skin: Negative for rash.  Neurological: Negative for dizziness, seizures, loss of consciousness, weakness and headaches.  Endo/Heme/Allergies: Negative for environmental allergies.  Psychiatric/Behavioral: Positive for depression. Negative for substance abuse and suicidal ideas. The patient is nervous/anxious. The patient does not have insomnia.   All other systems reviewed and are negative.   Blood pressure 112/75, pulse (!) 108, temperature 98.6 F (37 C), temperature source Oral, resp. rate 16, height 5\' 4"  (1.626 m).There is no height or weight on file to calculate BMI.  General Appearance: Disheveled  Eye Contact:  Minimal  Speech:  Pressured  Volume:  Normal  Mood:  Depressed and Dysphoric  Affect:  Restricted  Thought Process:  Coherent  Orientation:  Full (Time, Place, and Person)  Thought Content:  Delusions, Paranoid Ideation and Rumination  Suicidal Thoughts:  No  Homicidal Thoughts:  No  Memory:  Immediate;   Fair Recent;   Fair Remote;   Fair  Judgement:  Impaired  Insight:  Lacking  Psychomotor Activity:   Normal  Concentration:  Concentration: Poor  Recall:  Poor  Fund of Knowledge:  Good  Language:  Good  Akathisia:  No  Handed:  Right  AIMS (if indicated):     Assets:  Communication Skills Desire for Improvement Resilience  ADL's:  Intact  Cognition:  WNL  Sleep:  Number of Hours: 2.5     Treatment Plan Summary: Daily contact with patient to assess and evaluate symptoms and progress in treatment and Medication management   Mood Stabilization:  Depakote ER 1000 mg po Q hs For mood stabilization.Depakote level reviewed - therapeutic . Will cross titrate Geodon with Abilify - since patient has ADRs on higher dose of Geodon and she currently continues to be psychotic Plan   to DC Patient on an LAI . Cont  Trazodone  50 mg PO at bedtime as needed for sleep Continue Cogentin 0.5 mg po BID  PRN for EPS . Continue Vistaril 25 mg POI TID prn for anxiety Continue Glucophage 500 mg PO daily for type 2 diabetes Continue Ditropan XL 5 mgPO  daily for overactive bladder  Will continue to monitor vitals ,medication compliance and treatment side effects while patient is here.  CSW will work on disposition. Pt to be referred to ACTT . Patient to participate in therapeutic milieu   Tayia Stonesifer 07/30/2015 2:54 PM

## 2015-07-30 NOTE — Progress Notes (Signed)
D: Pt denies SI/HI/AV. Pt is guarded and suspicious of Abilify refused to take medication as ordered.  A: Pt was offered support and encouragement. Pt was given scheduled medications. Pt was encourage to attend groups. Q 15 minute checks were done for safety.  R:Pt did not attend group . Pt is taking  Some medication. Pt has no complaints.Pt receptive to treatment and safety maintained on unit.

## 2015-07-30 NOTE — Progress Notes (Signed)
Patient ID: [name] [name], female   DOB: Jun 12, 1980, 35 y.o.   MRN: [id number] PER STATE REGULATIONS 482.30  THIS CHART WAS REVIEWED FOR MEDICAL NECESSITY WITH RESPECT TO THE PATIENT'S ADMISSION/ DURATION OF STAY.  NEXT REVIEW DATE: 08/03/2015  Chauncy Lean, RN, BSN CASE MANAGER

## 2015-07-30 NOTE — Progress Notes (Signed)
Did not attend group 

## 2015-07-30 NOTE — Progress Notes (Signed)
Patient ID: [name] [name], female   DOB: Oct 07, 1980, 35 y.o.   MRN: [id number]  DAR: Pt. Denies SI/HI and A/V Hallucinations. She reports sleep was good last night. However, per note and report from night shift RN patient had previously stated she was not sleeping well last night. She reports appetite is good, energy level is low, and concentration is good. She rates depression, hopelessness and anxiety 0/10. Patient does not report any pain but does report continued frequent urination. Support and encouragement provided to the patient. Scheduled medications administered to patient with education and reeducation. Patient remains suspicious when receiving medications. She asks, "why am I still here? You all will have a lawsuit on your hands." Patient's eye contact is poor avoiding throughout the conversation with Probation officer. MD was seen speaking with patient is patient's room and Probation officer joined. After this meeting and MD Eappen left patient stated no other medication would help and she wanted to go home today. Although, patient had just agreed to trying Abilify. Patient is continues to be disorganized in thought process and mood is labile. She is depressed affect in mood this morning, laughing on the phone and religiously preoccupied, and then angry and irritable towards writer when asking why she cannot leave. Q15 minute checks are maintained for safety.

## 2015-07-30 NOTE — [facility] Group Notes (Signed)
[facility] LCSW Group Therapy  07/30/2015 1:15 pm  Type of Therapy: Process Group Therapy  Participation Level:  Active  Participation Quality:  Appropriate  Affect:  Flat  Cognitive:  Oriented  Insight:  Improving  Engagement in Group:  Limited  Engagement in Therapy:  Limited  Modes of Intervention:  Activity, Clarification, Education, Problem-solving and Support  Summary of Progress/Problems: Today's group addressed the issue of overcoming obstacles.  Patients were asked to identify their biggest obstacle post d/c that stands in the way of their on-going success, and then problem solve as to how to manage this. Invited.  Chose to not attend.  [name] 07/30/2015   4:34 PM

## 2015-07-30 NOTE — Progress Notes (Signed)
Pt came up to desk and asked if there was anything else she could have for sleep.  Per doctors' notes Pt has been complaining of too much sedation during day, and this is why no second dose of Trazodone was ordered.  Will continue to monitor to see if Pt can return to sleep.

## 2015-07-31 MED ORDER — ZIPRASIDONE HCL 40 MG PO CAPS
40.0000 mg | ORAL_CAPSULE | Freq: Two times a day (BID) | ORAL | Status: DC
  Administered 2015-07-31 – 2015-08-01 (×2): 40 mg via ORAL
  Filled 2015-07-31 (×4): qty 1

## 2015-07-31 MED ORDER — ARIPIPRAZOLE 10 MG PO TABS
10.0000 mg | ORAL_TABLET | Freq: Every day | ORAL | Status: DC
  Filled 2015-07-31 (×2): qty 1

## 2015-07-31 MED ORDER — TRAZODONE HCL 100 MG PO TABS
100.0000 mg | ORAL_TABLET | Freq: Every day | ORAL | Status: DC
  Administered 2015-07-31: 100 mg via ORAL
  Filled 2015-07-31 (×3): qty 1

## 2015-07-31 NOTE — [facility] Group Notes (Signed)
[facility] LCSW Group Therapy  07/31/2015 , 2:41 PM   Type of Therapy:  Group Therapy  Participation Level:  Active  Participation Quality:  Attentive  Affect:  Appropriate  Cognitive:  Alert  Insight:  Improving  Engagement in Therapy:  Engaged  Modes of Intervention:  Discussion, Exploration and Socialization  Summary of Progress/Problems: Today's group focused on the term Diagnosis.  Participants were asked to define the term, and then pronounce whether it is a negative, positive or neutral term.  Invited.  Chose to not attend.  Roque Lias B 07/31/2015 , 2:41 PM

## 2015-07-31 NOTE — Progress Notes (Signed)
[facility] MD Progress Note  07/31/2015 2:27 PM [name] [name]  MRN:  [id number]   Subjective:  Patient states " I am not going to trust any one , every body is against me, the Reita Cliche tells me so."   Objective: Patient seen and chart reviewed.Discussed patient with treatment team.    [name] [name] is alert, oriented today.[name] [name] is observed as avoiding eye contact, appears to be paranoid , delusional.  [name] [name] continues to be pressured , tangential . Pt also reports AH and VH - reports hearing voices of satan and angels and seeing angels and reports it is a "satanic attack.' [name] [name] also reports sleep issues. Per staff - pt continues to be psychotic and disorganized .  Principal Problem: Schizoaffective disorder, bipolar type ([place]) Diagnosis:   Patient Active Problem List   Diagnosis Date Noted  . Insomnia [G47.00]   . Anxiety state [F41.1]   . Overactive bladder [N32.81]   . Diabetes mellitus ([place]) [E11.9] 02/08/2015  . Schizoaffective disorder, bipolar type ([place]) [F25.0] 01/28/2015  . Non compliance w medication regimen [Z91.14]    Total Time spent with patient: 25 minutes  Past Psychiatric History: See Above  Past Medical History:  Past Medical History:  Diagnosis Date  . Bipolar affective disorder, currently manic, mild ([place])   . Diabetes mellitus without complication ([place])   . Schizophrenia ([place])    History reviewed. No pertinent surgical history. Family History:  Family History  Problem Relation Age of Onset  . Drug abuse Maternal Uncle     Social History:  History  Alcohol Use No     History  Drug use: Unknown    Social History   Social History  . Marital status: Legally Separated    Spouse name: N/A  . Number of children: N/A  . Years of education: N/A   Social History Main Topics  . Smoking status: Never Smoker  . Smokeless tobacco: None  . Alcohol use No  . Drug use: Unknown  . Sexual activity: Not Asked   Other Topics Concern  . None    Social History Narrative  . None   Additional Social History:    Sleep: Poor  Appetite:  Fair  Current Medications: Current Facility-Administered Medications  Medication Dose Route Frequency Provider Last Rate Last Dose  . acetaminophen (TYLENOL) tablet 650 mg  650 mg Oral Q6H PRN Patrecia Pour, NP   650 mg at 07/22/15 0956  . alum & mag hydroxide-simeth (MAALOX/MYLANTA) 200-200-20 MG/5ML suspension 30 mL  30 mL Oral Q4H PRN Patrecia Pour, NP   30 mL at 07/30/15 2114  . ARIPiprazole (ABILIFY) tablet 10 mg  10 mg Oral QHS Constance Hackenberg, MD      . benztropine (COGENTIN) tablet 0.5 mg  0.5 mg Oral BID PRN Hampton Abbot, MD      . divalproex (DEPAKOTE ER) 24 hr tablet 1,000 mg  1,000 mg Oral QHS Patrecia Pour, NP   1,000 mg at 07/30/15 2112  . docusate sodium (COLACE) capsule 100 mg  100 mg Oral Daily Kerrie Buffalo, NP   100 mg at 07/31/15 [id number]  . hydrOXYzine (ATARAX/VISTARIL) tablet 25 mg  25 mg Oral TID PRN Patrecia Pour, NP   25 mg at 07/22/15 2144  . magnesium hydroxide (MILK OF MAGNESIA) suspension 30 mL  30 mL Oral Daily PRN Patrecia Pour, NP   30 mL at 07/27/15 0912  . metFORMIN (GLUCOPHAGE) tablet 500 mg  500 mg Oral Q breakfast Patrecia Pour,  NP   500 mg at 07/31/15 0832  . multivitamin with minerals tablet 1 tablet  1 tablet Oral Daily Kerrie Buffalo, NP   1 tablet at 07/31/15 [id number]  . naproxen (NAPROSYN) tablet 500 mg  500 mg Oral Q12H PRN Kerrie Buffalo, NP   500 mg at 07/29/15 2054  . oxybutynin (DITROPAN-XL) 24 hr tablet 5 mg  5 mg Oral QHS Patrecia Pour, NP   5 mg at 07/30/15 2112  . polyethylene glycol (MIRALAX / GLYCOLAX) packet 17 g  17 g Oral BID Kerrie Buffalo, NP   17 g at 07/30/15 0826  . traZODone (DESYREL) tablet 50 mg  50 mg Oral QHS PRN Kerrie Buffalo, NP   50 mg at 07/30/15 2114  . ziprasidone (GEODON) capsule 40 mg  40 mg Oral BID WC Ursula Alert, MD        Lab Results:  Results for orders placed or performed during the hospital encounter of  07/21/15 (from the past 48 hour(s))  Glucose, capillary     Status: None   Collection Time: 07/30/15  6:08 AM  Result Value Ref Range   Glucose-Capillary 86 65 - 99 mg/dL    Blood Alcohol level:  Lab Results  Component Value Date   ETH <5 07/21/2015   ETH <5 0000000    Metabolic Disorder Labs: Lab Results  Component Value Date   HGBA1C 5.6 07/25/2015   MPG 114 07/25/2015   MPG 137 02/01/2015   Lab Results  Component Value Date   PROLACTIN 46.5 (H) 07/25/2015   PROLACTIN 21.6 02/01/2015   Lab Results  Component Value Date   CHOL 135 07/25/2015   TRIG 59 07/25/2015   HDL 50 07/25/2015   CHOLHDL 2.7 07/25/2015   VLDL 12 07/25/2015   LDLCALC 73 07/25/2015   LDLCALC 78 02/01/2015    Physical Findings: AIMS: Facial and Oral Movements Muscles of Facial Expression: None, normal Lips and Perioral Area: None, normal Jaw: None, normal Tongue: None, normal,Extremity Movements Upper (arms, wrists, hands, fingers): None, normal Lower (legs, knees, ankles, toes): None, normal, Trunk Movements Neck, shoulders, hips: None, normal, Overall Severity Severity of abnormal movements (highest score from questions above): None, normal Incapacitation due to abnormal movements: None, normal Patient's awareness of abnormal movements (rate only patient's report): No Awareness, Dental Status Current problems with teeth and/or dentures?: No Does patient usually wear dentures?: No  CIWA:    COWS:     Musculoskeletal: Strength & Muscle Tone: within normal limits Gait & Station: normal Patient leans: N/A  Psychiatric Specialty Exam: Physical Exam  Nursing note and vitals reviewed. Constitutional: She is oriented to person, place, and time.  Musculoskeletal: Normal range of motion.  Neurological: She is alert and oriented to person, place, and time.  Psychiatric: She has a normal mood and affect. Her behavior is normal. Thought content is paranoid. She is noncommunicative.     Review of Systems  Constitutional: Negative for fever and malaise/fatigue.  HENT: Negative for congestion and sore throat.   Eyes: Negative for blurred vision, discharge and redness.  Respiratory: Negative for cough, shortness of breath and wheezing.   Cardiovascular: Negative for chest pain and palpitations.  Gastrointestinal: Negative for abdominal pain, constipation, diarrhea, heartburn, nausea and vomiting.  Genitourinary: Negative for dysuria and urgency.  Musculoskeletal: Negative for falls and myalgias.  Skin: Negative for rash.  Neurological: Negative for dizziness, seizures, loss of consciousness, weakness and headaches.  Endo/Heme/Allergies: Negative for environmental allergies.  Psychiatric/Behavioral: Positive for depression and hallucinations.  Negative for substance abuse and suicidal ideas. The patient is nervous/anxious. The patient does not have insomnia.   All other systems reviewed and are negative.   Blood pressure (!) 136/91, pulse 83, temperature 97.8 F (36.6 C), temperature source Oral, resp. rate 20, height 5\' 4"  (1.626 m).There is no height or weight on file to calculate BMI.  General Appearance: Disheveled  Eye Contact:  Minimal  Speech:  Pressured  Volume:  Normal  Mood:  Depressed and Dysphoric  Affect:  Restricted  Thought Process:  Coherent  Orientation:  Full (Time, Place, and Person)  Thought Content:  Delusions, Hallucinations: Auditory Visual, Paranoid Ideation and Rumination sees and hears satan and angels  Suicidal Thoughts:  No but is very paranoid- potential danger to self or others  Homicidal Thoughts:  No  Memory:  Immediate;   Fair Recent;   Fair Remote;   Fair  Judgement:  Impaired  Insight:  Lacking  Psychomotor Activity:  Normal  Concentration:  Concentration: Poor  Recall:  Poor  Fund of Knowledge:  Good  Language:  Good  Akathisia:  No  Handed:  Right  AIMS (if indicated):     Assets:  Communication Skills Desire for  Improvement Resilience  ADL's:  Intact  Cognition:  WNL  Sleep:  Number of Hours: 4     Treatment Plan Summary: Daily contact with patient to assess and evaluate symptoms and progress in treatment and Medication management   Mood Stabilization:  Depakote ER 1000 mg po Q hs For mood stabilization.Depakote level reviewed - therapeutic . Will continue to cross titrate Geodon with Abilify - since patient has ADRs on higher dose of Geodon and she currently continues to be psychotic Plan   to DC Patient on an LAI . Increase Trazodone to 100 mg PO at bedtime as needed for sleep Continue Cogentin 0.5 mg po BID  PRN for EPS . Continue Vistaril 25 mg POI TID prn for anxiety Continue Glucophage 500 mg PO daily for type 2 diabetes Continue Ditropan XL 5 mgPO  daily for overactive bladder . Will get UA - for concerns about UTI. Will continue to monitor vitals ,medication compliance and treatment side effects while patient is here.  CSW will continue to work on disposition. Pt to be referred to ACTT . Patient to participate in therapeutic milieu   Lucifer Soja 07/31/2015 2:27 PM

## 2015-07-31 NOTE — [facility] Group Notes (Signed)
[facility] Group Notes:  (Nursing/MHT/Case Management/Adjunct)  Date:  07/31/2015  Time:  10:32 AM  Type of Therapy:  Nurse Education  Participation Level:  Active  Participation Quality:  Appropriate, Attentive and Sharing  Affect:  Appropriate  Cognitive:  Alert and Appropriate  Insight:  Lacking.  Engagement in Group:  Engaged  Modes of Intervention:  Discussion and Education  Summary of Progress/Problems: Topic was on Recovery.  Recovery means different things to different people.  Recovery is a process.  Patient encouraged to start the process gradually but steadily maintain progress.  Patient was tangential and delusional during group.  Patient was religiously preoccupied.  Patient had difficulty understanding the difference between her medication and depression. Mart Piggs 07/31/2015, 10:32 AM

## 2015-07-31 NOTE — Progress Notes (Signed)
D: Pt denied SI, HI and pain when assessed this AM. Nurse, learning disability it was a Scientist, product/process development" when asked about AVH. Avertive eye contact with pt looking down on floor during assessment and medication pass.  Attended scheduled groups with limited insight but was verbally engaged. Pt refused CBG monitoring when approached X 3 this AM.  A: Scheduled medications administered as prescribed. Support offered. Verbal education and encouragement offered towards treatment compliance.  Q 15 minutes checks maintained for safety.  R: Pt receptive to care. Denies adverse drug reactions. Safety maintained on and off unit.

## 2015-07-31 NOTE — Progress Notes (Signed)
D: Pt denies SI/HI. Pt is pleasant and cooperative. Pt did not state a goal she just said it is a Water quality scientist when asked if she was hearing voices or seeing things. A: Pt was offered support and encouragement. Pt was given scheduled medications. Pt was encourage to attend groups. Q 15 minute checks were done for safety.  R:Pt attends groups . Pt is taking medication. Pt has no complaints.Pt receptive to treatment and safety maintained on unit.

## 2015-08-01 LAB — GLUCOSE, CAPILLARY: Glucose-Capillary: 68 mg/dL (ref 65–99)

## 2015-08-01 LAB — URINALYSIS W MICROSCOPIC (NOT AT ARMC)
Bilirubin Urine: NEGATIVE
GLUCOSE, UA: NEGATIVE mg/dL
HGB URINE DIPSTICK: NEGATIVE
Ketones, ur: NEGATIVE mg/dL
Leukocytes, UA: NEGATIVE
Nitrite: NEGATIVE
PROTEIN: NEGATIVE mg/dL
Specific Gravity, Urine: 1.015 (ref 1.005–1.030)
pH: 6.5 (ref 5.0–8.0)

## 2015-08-01 MED ORDER — OXYBUTYNIN CHLORIDE ER 10 MG PO TB24
10.0000 mg | ORAL_TABLET | Freq: Every day | ORAL | Status: DC
  Administered 2015-08-02 – 2015-08-06 (×5): 10 mg via ORAL
  Filled 2015-08-01 (×7): qty 1
  Filled 2015-08-01: qty 5
  Filled 2015-08-01 (×2): qty 1

## 2015-08-01 MED ORDER — TRAZODONE HCL 100 MG PO TABS: 100.0000 mg | ORAL_TABLET | Freq: Every evening | ORAL | Status: DC | PRN

## 2015-08-01 MED ORDER — PALIPERIDONE ER 3 MG PO TB24
3.0000 mg | ORAL_TABLET | Freq: Every day | ORAL | Status: DC
  Administered 2015-08-01: 3 mg via ORAL
  Filled 2015-08-01 (×2): qty 1

## 2015-08-01 MED ORDER — DOXEPIN HCL 10 MG PO CAPS
10.0000 mg | ORAL_CAPSULE | Freq: Every evening | ORAL | Status: DC | PRN
  Administered 2015-08-04 – 2015-08-06 (×2): 10 mg via ORAL
  Filled 2015-08-01 (×2): qty 1

## 2015-08-01 MED ORDER — ZIPRASIDONE HCL 20 MG PO CAPS
20.0000 mg | ORAL_CAPSULE | Freq: Two times a day (BID) | ORAL | Status: DC
  Administered 2015-08-01 – 2015-08-02 (×2): 20 mg via ORAL
  Filled 2015-08-01 (×4): qty 1

## 2015-08-01 NOTE — [facility] Group Notes (Signed)
[facility] [employer] Group Therapy  08/01/2015 , 1:50 PM    Type of Therapy:  Mental Health Association Presentation  Participation Level:  Active  Participation Quality:  Attentive  Affect:  Blunted  Cognitive:  Oriented  Insight:  Limited  Engagement in Therapy:  Engaged  Modes of Intervention:  Discussion, Education and Socialization  Summary of Progress/Problems:  Shanon Brow from [place] came to present his recovery story and play the guitar.   Invited.  Chose to not attend. Roque Lias B 08/01/2015 , 1:50 PM

## 2015-08-01 NOTE — Progress Notes (Signed)
[facility] MD Progress Note  08/01/2015 3:40 PM [name] [name]  MRN:  [id number]   Subjective:  Patient states " I am not sleeping ,you should not be giving me something that keeps me awake at night. I am not going to take abilify any more."    Objective: Patient seen and chart reviewed.Discussed patient with treatment team.    [name] [name] is [name], oriented today.[name] [name]  appears to be paranoid , delusional.  [name] [name] continues to be pressured , tangential and irritable - was seen as being loud and agitated at writer this AM - shouting out in a loud tone.  Pt continues to be hyperreligious and delusional - talks about angels and satan. She continues to have sleep issues and reports abilify could be causing her sleep issues. She reports she does not want to be on it anymore,. Pt reports she wants to be on another medication- discussed [name] [name] - she is willing to try it.    Principal Problem: Schizoaffective disorder, bipolar type ([place]) Diagnosis:   Patient Active Problem List   Diagnosis Date Noted  . Insomnia [G47.00]   . Anxiety state [F41.1]   . Overactive bladder [N32.81]   . Diabetes mellitus ([place]) [E11.9] 02/08/2015  . Schizoaffective disorder, bipolar type ([place]) [F25.0] 01/28/2015  . Non compliance w medication regimen [Z91.14]    Total Time spent with patient: 25 minutes  Past Psychiatric History: See Above  Past Medical History:  Past Medical History:  Diagnosis Date  . Bipolar affective disorder, currently manic, mild ([place])   . Diabetes mellitus without complication ([place])   . Schizophrenia ([place])    History reviewed. No pertinent surgical history. Family History:  Family History  Problem Relation Age of Onset  . Drug abuse Maternal Uncle     Social History:  History  Alcohol Use No     History  Drug use: Unknown    Social History   Social History  . Marital status: Legally Separated    Spouse name: N/A  . Number of children: N/A  . Years of  education: N/A   Social History Main Topics  . Smoking status: Never Smoker  . Smokeless tobacco: None  . Alcohol use No  . Drug use: Unknown  . Sexual activity: Not Asked   Other Topics Concern  . None   Social History Narrative  . None   Additional Social History:    Sleep: Poor  Appetite:  Fair  Current Medications: Current Facility-Administered Medications  Medication Dose Route Frequency Provider Last Rate Last Dose  . acetaminophen (TYLENOL) tablet 650 mg  650 mg Oral Q6H PRN [name] Pour, NP   650 mg at 07/22/15 0956  . alum & mag hydroxide-simeth (MAALOX/MYLANTA) 200-200-20 MG/5ML suspension 30 mL  30 mL Oral Q4H PRN [name] Pour, NP   30 mL at 07/30/15 2114  . benztropine (COGENTIN) tablet 0.5 mg  0.5 mg Oral BID PRN [name] Abbot, MD      . divalproex (DEPAKOTE ER) 24 hr tablet 1,000 mg  1,000 mg Oral QHS [name] Pour, NP   1,000 mg at 07/31/15 2139  . docusate sodium (COLACE) capsule 100 mg  100 mg Oral Daily [name] Buffalo, NP   100 mg at 08/01/15 0818  . doxepin (SINEQUAN) capsule 10 mg  10 mg Oral QHS PRN [name] Alert, MD      . hydrOXYzine (ATARAX/VISTARIL) tablet 25 mg  25 mg Oral TID PRN [name] Pour, NP   25 mg at 07/22/15  2144  . magnesium hydroxide (MILK OF MAGNESIA) suspension 30 mL  30 mL Oral Daily PRN [name] Pour, NP   30 mL at 07/27/15 0912  . metFORMIN (GLUCOPHAGE) tablet 500 mg  500 mg Oral Q breakfast [name] Pour, NP   500 mg at 08/01/15 0818  . multivitamin with minerals tablet 1 tablet  1 tablet Oral Daily [name] Buffalo, NP   1 tablet at 08/01/15 0818  . naproxen (NAPROSYN) tablet 500 mg  500 mg Oral Q12H PRN [name] Buffalo, NP   500 mg at 07/29/15 2054  . oxybutynin (DITROPAN-XL) 24 hr tablet 10 mg  10 mg Oral QHS [name] Weide, MD      . paliperidone (INVEGA) 24 hr tablet 3 mg  3 mg Oral QHS [name] Furniss, MD      . polyethylene glycol (MIRALAX / GLYCOLAX) packet 17 g  17 g Oral BID [name] Buffalo, NP   17 g at 08/01/15 [id number]  .  ziprasidone (GEODON) capsule 20 mg  20 mg Oral BID WC [name] Alert, MD        Lab Results:  Results for orders placed or performed during the hospital encounter of 07/21/15 (from the past 48 hour(s))  Urinalysis with microscopic (not at [facility])     Status: Abnormal   Collection Time: 07/31/15  2:22 PM  Result Value Ref Range   Color, Urine YELLOW YELLOW   APPearance CLEAR CLEAR   Specific Gravity, Urine 1.015 1.005 - 1.030   pH 6.5 5.0 - 8.0   Glucose, UA NEGATIVE NEGATIVE mg/dL   Hgb urine dipstick NEGATIVE NEGATIVE   Bilirubin Urine NEGATIVE NEGATIVE   Ketones, ur NEGATIVE NEGATIVE mg/dL   Protein, ur NEGATIVE NEGATIVE mg/dL   Nitrite NEGATIVE NEGATIVE   Leukocytes, UA NEGATIVE NEGATIVE   WBC, UA 0-5 0 - 5 WBC/hpf   RBC / HPF 0-5 0 - 5 RBC/hpf   Bacteria, UA RARE (A) NONE SEEN   Squamous Epithelial / LPF 0-5 (A) NONE SEEN    Comment: Performed at [facility]    Blood Alcohol level:  Lab Results  Component Value Date   [facility] <5 07/21/2015   ETH <5 0000000    Metabolic Disorder Labs: Lab Results  Component Value Date   HGBA1C 5.6 07/25/2015   MPG 114 07/25/2015   MPG 137 02/01/2015   Lab Results  Component Value Date   PROLACTIN 46.5 (H) 07/25/2015   PROLACTIN 21.6 02/01/2015   Lab Results  Component Value Date   CHOL 135 07/25/2015   TRIG 59 07/25/2015   HDL 50 07/25/2015   CHOLHDL 2.7 07/25/2015   VLDL 12 07/25/2015   LDLCALC 73 07/25/2015   LDLCALC 78 02/01/2015    Physical Findings: AIMS: Facial and Oral Movements Muscles of Facial Expression: None, normal Lips and Perioral Area: None, normal Jaw: None, normal Tongue: None, normal,Extremity Movements Upper (arms, wrists, hands, fingers): None, normal Lower (legs, knees, ankles, toes): None, normal, Trunk Movements Neck, shoulders, hips: None, normal, Overall Severity Severity of abnormal movements (highest score from questions above): None, normal Incapacitation due to abnormal  movements: None, normal Patient's awareness of abnormal movements (rate only patient's report): No Awareness, Dental Status Current problems with teeth and/or dentures?: No Does patient usually wear dentures?: No  CIWA:    COWS:     Musculoskeletal: Strength & Muscle Tone: within normal limits Gait & Station: normal Patient leans: N/A  Psychiatric Specialty Exam: Physical Exam  Nursing note and vitals reviewed.  Constitutional: She is oriented to person, place, and time.  Musculoskeletal: Normal range of motion.  Neurological: She is [name] and oriented to person, place, and time.  Psychiatric: She has a normal mood and affect. Her behavior is normal. Thought content is paranoid. She is noncommunicative.    Review of Systems  Constitutional: Negative for fever and malaise/fatigue.  HENT: Negative for congestion and sore throat.   Eyes: Negative for blurred vision, discharge and redness.  Respiratory: Negative for cough, shortness of breath and wheezing.   Cardiovascular: Negative for chest pain and palpitations.  Gastrointestinal: Negative for abdominal pain, constipation, diarrhea, heartburn, nausea and vomiting.  Genitourinary: Negative for dysuria and urgency.  Musculoskeletal: Negative for falls and myalgias.  Skin: Negative for rash.  Neurological: Negative for dizziness, seizures, loss of consciousness, weakness and headaches.  Endo/Heme/Allergies: Negative for environmental allergies.  Psychiatric/Behavioral: Positive for depression and hallucinations. Negative for substance abuse and suicidal ideas. The patient is nervous/anxious and has insomnia.   All other systems reviewed and are negative.   Blood pressure 111/71, pulse 86, temperature 98.2 F (36.8 C), temperature source Oral, resp. rate 18, height 5\' 4"  (1.626 m).There is no height or weight on file to calculate BMI.  General Appearance: Disheveled  Eye Contact:  Minimal  Speech:  Pressured  Volume:  Normal  Mood:   Depressed and Dysphoric  Affect:  Restricted  Thought Process:  Coherent  Orientation:  Full (Time, Place, and Person)  Thought Content:  Delusions, Hallucinations: Auditory Visual, Paranoid Ideation and Rumination sees and hears satan and angels  Suicidal Thoughts:  No but is very paranoid- potential danger to self or others  Homicidal Thoughts:  No  Memory:  Immediate;   Fair Recent;   Fair Remote;   Fair  Judgement:  Impaired  Insight:  Lacking  Psychomotor Activity:  Normal  Concentration:  Concentration: Poor  Recall:  Poor  Fund of Knowledge:  Good  Language:  Good  Akathisia:  No  Handed:  Right  AIMS (if indicated):     Assets:  Communication Skills Desire for Improvement Resilience  ADL's:  Intact  Cognition:  WNL  Sleep:  Number of Hours: 1.25     Treatment Plan Summary: Daily contact with patient to assess and evaluate symptoms and progress in treatment and Medication management   Mood Stabilization:  Depakote ER 1000 mg po Q hs For mood stabilization.Depakote level reviewed - therapeutic . Will discontinue Abilify for lack of tolerability. Will start Invega 3 mg po qhs - with plan to titrate higher. Will continue to taper down geodon. Plan to DC Patient on an LAI . Will discontinue Trazodone for SE - will start Doxepin 10 mg po qhs prn for sleep. Continue Cogentin 0.5 mg po BID  PRN for EPS . Continue Vistaril 25 mg POI TID prn for anxiety Continue Glucophage 500 mg PO daily for type 2 diabetes Increase Ditropan XL to 10 mgPO  daily for overactive bladder . UA - wnl. Will continue to monitor vitals ,medication compliance and treatment side effects while patient is here.  CSW will continue to work on disposition. Pt to be referred to ACTT . Patient to participate in therapeutic milieu   Anastassia Noack 08/01/2015 3:40 PM

## 2015-08-01 NOTE — [facility] Group Notes (Signed)
Patient did not attend group.

## 2015-08-01 NOTE — Progress Notes (Signed)
Recreation Therapy Notes  08/01/15 1300:  Pt was bright, smiling and upbeat.  Pt stated that she likes to write poetry, play spades and read the bible.  Pt also stated that she needs to make friends.  LRT encouraged pt to venture out of her room if she wanted to make some friends and she was receptive to that.  Pt started reading bible scriptures and stated she had been racist this morning. Pt was very religious and focused on the bible but could be redirected.  LRT will follow up with pt tomorrow.  Victorino Sparrow, LRT/CTRS     Ria Comment, Joann Jorge A 08/01/2015 2:12 PM

## 2015-08-01 NOTE — Tx Team (Signed)
Interdisciplinary Treatment Plan Update (Adult) Date: 08/01/2015   Date: 08/01/2015 3:13 PM  Progress in Treatment:  Attending groups: No Participating in groups: No Taking medication as prescribed: Yes  Tolerating medication: Yes  Family/Significant othe contact made: Yes Patient understands diagnosis: Limited insight Discussing patient identified problems/goals with staff: Yes  Medical problems stabilized or resolved: Yes  Denies suicidal/homicidal ideation: Yes Patient has not harmed self or Others: Yes   New problem(s) identified: None identified at this time.   Discharge Plan or Barriers: Pt will return home and follow-up with outpatient services.   Additional comments: 07/27/15: Mood Stabilization: Depakote ER 1000 mg QHS Continue Geodon 80 mg Bid for mood stabilization and psychosis. Discussed in length with patient the need for medication compliance in order for her to be psychiatrically stable outpatient. Also discussed her current stresses which include the place she lives at, feeling overwhelmed because there are a lot of people around and the need for her to work on her coping skills, desensitization while here Decrease trazodone to 50 mg 1 at bedtime as needed for sleep Change Cogentin 0.5 mg po BID PRN as patient has not had any EPS symptoms  08/01/15:  " I am not going to trust any one , every body is against me, the Reita Cliche tells me so." Depakote ER 1000 mg po Q hs For mood stabilization.Depakote level reviewed - therapeutic . Will continue to cross titrate Geodon with Abilify - since patient has ADRs on higher dose of Geodon and she currently continues to be psychotic Plan   to DC Patient on an LAI . Increase Trazodone to 100 mg PO at bedtime as needed for sleep Continue Cogentin 0.5 mg po BID  PRN for EPS . Continue Vistaril 25 mg POI TID prn for anxiety Continue Glucophage 500 mg PO daily for type 2 diabetes Continue Ditropan XL 5 mgPO  daily for overactive bladder  . Will get UA - for concerns about UTI.   Reason for Continuation of Hospitalization:  Medication stabilization Psychosis Paranoia  Estimated length of stay: 3-5 days  Review of initial/current patient goals per problem list:   1.  Goal(s): Patient will participate in aftercare plan  Met:  Yes  Target date: 3-5 days from date of admission   As evidenced by: Patient will participate within aftercare plan AEB aftercare provider and housing plan at discharge being identified.   07/23/15: Pt will return home and follow-up with outpatient services.  5.  Goal(s): Patient will demonstrate decreased signs of psychosis  . Met:  No . Target date: 3-5 days from date of admission  . As evidenced by: Patient will demonstrate decreased frequency of AVH or return to baseline function   07/23/15: Pt continues to exhibit signs of AVH and is guarded with staff, reports feeling that people are    "out to get her."   07/27/15:  Continues to isolate in her room, endorses paranoia 08/01/15:  Continues to avoid groups, is religiously preoccupied  Attendees:  Patient:    Family:    Physician: Dr. Shea Evans, MD  08/01/2015 3:13 PM  Nursing: Hedy Jacob, RN  08/01/2015 3:13 PM  Clinical Social Worker Rod Conseco   08/01/2015 3:13 PM  Other:  08/01/2015 3:13 PM  Clinical: Lars Pinks, RN Case manager  08/01/2015 3:13 PM  Other:  08/01/2015 3:13 PM  Other:     Ripley Fraise

## 2015-08-01 NOTE — Progress Notes (Signed)
D: Pt is A & O X3. Denies SI, HI, AVH and pain this shift. However, pt is religiously preoccupied, suspicious and guarded. Refused CBG monitoring again today despite multiple verbal education; stated "Jesus told me not to do it". Reported poor sleep last night related to frequent urination. Datropan-XL 10 mg tab ordered to start tonight.  A: Support and availability provided to pt. Writer encouraged pt to voice concerns or needs. Scheduled medications administered as prescribed. Pt was made aware of changes to her current regimen. Q 15 minutes checks maintained for safety.  R: Pt refused to attend scheduled groups despite multiple prompts. Compliant with medications as ordered. Pt is in agreement with current treatment regimen. Remains safe on unit. POC care continues.

## 2015-08-01 NOTE — Clinical Social Work Note (Signed)
Patient requests information on [employer] housing voucher in [place].  Info on [place] given.  Edwyna Shell, LCSW Lead Clinical Social Worker Phone:  [phone]

## 2015-08-02 LAB — URINE CULTURE: CULTURE: NO GROWTH

## 2015-08-02 LAB — GLUCOSE, CAPILLARY: Glucose-Capillary: 101 mg/dL — ABNORMAL HIGH (ref 65–99)

## 2015-08-02 MED ORDER — DIPHENHYDRAMINE HCL 50 MG/ML IJ SOLN
50.0000 mg | Freq: Once | INTRAMUSCULAR | Status: AC
Start: 2015-08-02 — End: 2015-08-02
  Administered 2015-08-02: 50 mg via INTRAMUSCULAR
  Filled 2015-08-02: qty 1

## 2015-08-02 MED ORDER — DIPHENHYDRAMINE HCL 50 MG/ML IJ SOLN
INTRAMUSCULAR | Status: AC
  Administered 2015-08-02: 50 mg
  Filled 2015-08-02: qty 1

## 2015-08-02 MED ORDER — PALIPERIDONE PALMITATE 156 MG/ML IM SUSP: 156.0000 mg | Freq: Once | INTRAMUSCULAR | Status: DC

## 2015-08-02 MED ORDER — LORAZEPAM 2 MG/ML IJ SOLN
2.0000 mg | Freq: Once | INTRAMUSCULAR | Status: AC
  Administered 2015-08-02: 2 mg via INTRAMUSCULAR
  Filled 2015-08-02: qty 1

## 2015-08-02 MED ORDER — PALIPERIDONE PALMITATE 156 MG/ML IM SUSP: 117.0000 mg | INTRAMUSCULAR | Status: DC

## 2015-08-02 MED ORDER — HALOPERIDOL LACTATE 5 MG/ML IJ SOLN
10.0000 mg | Freq: Once | INTRAMUSCULAR | Status: AC
Start: 2015-08-02 — End: 2015-08-02
  Administered 2015-08-02: 10 mg via INTRAMUSCULAR
  Filled 2015-08-02: qty 2

## 2015-08-02 MED ORDER — HALOPERIDOL LACTATE 5 MG/ML IJ SOLN
INTRAMUSCULAR | Status: AC
  Administered 2015-08-02: 19:00:00
  Filled 2015-08-02: qty 2

## 2015-08-02 MED ORDER — LORAZEPAM 2 MG/ML IJ SOLN
INTRAMUSCULAR | Status: AC
  Administered 2015-08-02: 2 mg via INTRAMUSCULAR
  Filled 2015-08-02: qty 1

## 2015-08-02 MED ORDER — PALIPERIDONE ER 6 MG PO TB24
6.0000 mg | ORAL_TABLET | Freq: Every day | ORAL | Status: DC
  Administered 2015-08-02 – 2015-08-05 (×4): 6 mg via ORAL
  Filled 2015-08-02 (×5): qty 1

## 2015-08-02 MED ORDER — PALIPERIDONE PALMITATE 234 MG/1.5ML IM SUSP
234.0000 mg | Freq: Once | INTRAMUSCULAR | Status: AC
  Administered 2015-08-05: 234 mg via INTRAMUSCULAR
  Filled 2015-08-02: qty 1.5

## 2015-08-02 NOTE — Progress Notes (Signed)
Patient did not attend group this evening.

## 2015-08-02 NOTE — [facility] Group Notes (Signed)
[facility] LCSW Group Therapy  08/02/2015 6:12 PM  Type of Therapy:  Group Therapy  Participation Level:  Invited, did not attend.  Beverely Pace 08/02/2015, 6:12 PM

## 2015-08-02 NOTE — Progress Notes (Signed)
D:Pt is hyper religious and laughs inappropriately at times when no one else is interacting with her. Pt appears hypomanic with bright lipstick and clothing made into a hat. She denies depression and anxiety.  A:Offered support, encouragement, redirection and 15 minute checks. R:Pt remains safe on the unit.

## 2015-08-02 NOTE — Progress Notes (Signed)
Pt became loud yelling in hall slamming the door disrupting the unit. Pt made multiple religious comments saying that she was a profit and the doctor was a devil. She said that she was leaving. Staff de escalated and medications were given. Pt requested to take Depakote. Medication was given early as pt refused earlier. Pt took one pill 500mg  and spit out the other. Safety maintained.

## 2015-08-02 NOTE — Progress Notes (Addendum)
   D: Pt appeared to be hypomanic today. Pt was visible in the milieu more today than previous day. When asked about her day pt stated loudly, "I had to fight for my medicine." Writer found it difficult to follow the conversation. Stated that she was in the shower and spoke to God. Stated God was talking to her, but she can't remember what he said. Pt has no questions or concerns.    A:  Support and encouragement was offered. 15 min checks continued for safety.  R: Pt remains safe.    Addendum: Pt refused her scheduled ditropan

## 2015-08-02 NOTE — Progress Notes (Addendum)
[facility] MD Progress Note  08/02/2015 12:48 PM [name] [name]  MRN:  [id number]   Subjective:  Patient states " I am OK today . I still have problems with urinating a lot. I do not think they are giving me that medication for that."     Objective: Patient seen and chart reviewed.Discussed patient with treatment team.    [name] [name] is alert, oriented today. Pt continues to be pressured often , however is less irritable today. Pt per staff continues to be manic , hyperactive on the unit ,and appears disorganized , but with some improvement. Pt continues to have somatic complaints on and off - currently preoccupied with her increased frequency of urination. Pt offered medications for symptomatic relief of overactive bladder , UTI - negative ( UA)      Principal Problem: Schizoaffective disorder, bipolar type ([place]) Diagnosis:   Patient Active Problem List   Diagnosis Date Noted  . Insomnia [G47.00]   . Anxiety state [F41.1]   . Overactive bladder [N32.81]   . Diabetes mellitus ([place]) [E11.9] 02/08/2015  . Schizoaffective disorder, bipolar type ([place]) [F25.0] 01/28/2015  . Non compliance w medication regimen [Z91.14]    Total Time spent with patient: 25 minutes  Past Psychiatric History: See Above  Past Medical History:  Past Medical History:  Diagnosis Date  . Bipolar affective disorder, currently manic, mild ([place])   . Diabetes mellitus without complication ([place])   . Schizophrenia ([place])    History reviewed. No pertinent surgical history. Family History:  Family History  Problem Relation Age of Onset  . Drug abuse Maternal Uncle     Social History:  History  Alcohol Use No     History  Drug use: Unknown    Social History   Social History  . Marital status: Legally Separated    Spouse name: N/A  . Number of children: N/A  . Years of education: N/A   Social History Main Topics  . Smoking status: Never Smoker  . Smokeless tobacco: None  . Alcohol use  No  . Drug use: Unknown  . Sexual activity: Not Asked   Other Topics Concern  . None   Social History Narrative  . None   Additional Social History:    Sleep: Fair  Appetite:  Fair  Current Medications: Current Facility-Administered Medications  Medication Dose Route Frequency Provider Last Rate Last Dose  . acetaminophen (TYLENOL) tablet 650 mg  650 mg Oral Q6H PRN Patrecia Pour, NP   650 mg at 07/22/15 0956  . alum & mag hydroxide-simeth (MAALOX/MYLANTA) 200-200-20 MG/5ML suspension 30 mL  30 mL Oral Q4H PRN Patrecia Pour, NP   30 mL at 07/30/15 2114  . benztropine (COGENTIN) tablet 0.5 mg  0.5 mg Oral BID PRN Hampton Abbot, MD      . divalproex (DEPAKOTE ER) 24 hr tablet 1,000 mg  1,000 mg Oral QHS Patrecia Pour, NP   1,000 mg at 08/01/15 2103  . docusate sodium (COLACE) capsule 100 mg  100 mg Oral Daily Kerrie Buffalo, NP   100 mg at 08/02/15 0803  . doxepin (SINEQUAN) capsule 10 mg  10 mg Oral QHS PRN Ursula Alert, MD      . hydrOXYzine (ATARAX/VISTARIL) tablet 25 mg  25 mg Oral TID PRN Patrecia Pour, NP   25 mg at 07/22/15 2144  . magnesium hydroxide (MILK OF MAGNESIA) suspension 30 mL  30 mL Oral Daily PRN Patrecia Pour, NP   30 mL at 07/27/15  [id number]  . metFORMIN (GLUCOPHAGE) tablet 500 mg  500 mg Oral Q breakfast Patrecia Pour, NP   500 mg at 08/02/15 [id number]  . multivitamin with minerals tablet 1 tablet  1 tablet Oral Daily Kerrie Buffalo, NP   1 tablet at 08/02/15 0803  . naproxen (NAPROSYN) tablet 500 mg  500 mg Oral Q12H PRN Kerrie Buffalo, NP   500 mg at 07/29/15 2054  . oxybutynin (DITROPAN-XL) 24 hr tablet 10 mg  10 mg Oral QHS Ursula Alert, MD      . Derrill Memo ON 09/07/2015] paliperidone (INVEGA SUSTENNA) injection 117 mg  117 mg Intramuscular Q28 days Ursula Alert, MD      . Derrill Memo ON 08/10/2015] paliperidone (INVEGA SUSTENNA) injection 156 mg  156 mg Intramuscular Once Ursula Alert, MD      . Derrill Memo ON 08/05/2015] paliperidone (INVEGA SUSTENNA) injection 234 mg   234 mg Intramuscular Once Tenoch Mcclure, MD      . paliperidone (INVEGA) 24 hr tablet 6 mg  6 mg Oral QHS Kaylan Yates, MD      . polyethylene glycol (MIRALAX / GLYCOLAX) packet 17 g  17 g Oral BID Kerrie Buffalo, NP   17 g at 08/02/15 [id number]    Lab Results:  Results for orders placed or performed during the hospital encounter of 07/21/15 (from the past 48 hour(s))  Urinalysis with microscopic (not at [facility])     Status: Abnormal   Collection Time: 07/31/15  2:22 PM  Result Value Ref Range   Color, Urine YELLOW YELLOW   APPearance CLEAR CLEAR   Specific Gravity, Urine 1.015 1.005 - 1.030   pH 6.5 5.0 - 8.0   Glucose, UA NEGATIVE NEGATIVE mg/dL   Hgb urine dipstick NEGATIVE NEGATIVE   Bilirubin Urine NEGATIVE NEGATIVE   Ketones, ur NEGATIVE NEGATIVE mg/dL   Protein, ur NEGATIVE NEGATIVE mg/dL   Nitrite NEGATIVE NEGATIVE   Leukocytes, UA NEGATIVE NEGATIVE   WBC, UA 0-5 0 - 5 WBC/hpf   RBC / HPF 0-5 0 - 5 RBC/hpf   Bacteria, UA RARE (A) NONE SEEN   Squamous Epithelial / LPF 0-5 (A) NONE SEEN    Comment: Performed at [facility]  Urine culture     Status: None   Collection Time: 07/31/15  2:23 PM  Result Value Ref Range   Specimen Description      URINE, CLEAN CATCH Performed at [facility]    Special Requests      NONE Performed at [place] Performed at [facility]     Report Status 08/02/2015 FINAL   Glucose, capillary     Status: None   Collection Time: 08/01/15  5:21 PM  Result Value Ref Range   Glucose-Capillary 68 65 - 99 mg/dL   Comment 1 Notify RN    Comment 2 Document in Chart   Glucose, capillary     Status: Abnormal   Collection Time: 08/02/15  6:30 AM  Result Value Ref Range   Glucose-Capillary 101 (H) 65 - 99 mg/dL    Blood Alcohol level:  Lab Results  Component Value Date   ETH <5 07/21/2015   ETH <5 0000000    Metabolic Disorder Labs: Lab Results   Component Value Date   HGBA1C 5.6 07/25/2015   MPG 114 07/25/2015   MPG 137 02/01/2015   Lab Results  Component Value Date   PROLACTIN 46.5 (H) 07/25/2015   PROLACTIN 21.6 02/01/2015  Lab Results  Component Value Date   CHOL 135 07/25/2015   TRIG 59 07/25/2015   HDL 50 07/25/2015   CHOLHDL 2.7 07/25/2015   VLDL 12 07/25/2015   LDLCALC 73 07/25/2015   LDLCALC 78 02/01/2015    Physical Findings: AIMS: Facial and Oral Movements Muscles of Facial Expression: None, normal Lips and Perioral Area: None, normal Jaw: None, normal Tongue: None, normal,Extremity Movements Upper (arms, wrists, hands, fingers): None, normal Lower (legs, knees, ankles, toes): None, normal, Trunk Movements Neck, shoulders, hips: None, normal, Overall Severity Severity of abnormal movements (highest score from questions above): None, normal Incapacitation due to abnormal movements: None, normal Patient's awareness of abnormal movements (rate only patient's report): No Awareness, Dental Status Current problems with teeth and/or dentures?: No Does patient usually wear dentures?: No  CIWA:    COWS:     Musculoskeletal: Strength & Muscle Tone: within normal limits Gait & Station: normal Patient leans: N/A  Psychiatric Specialty Exam: Physical Exam  Nursing note and vitals reviewed. Constitutional: She is oriented to person, place, and time.  Musculoskeletal: Normal range of motion.  Neurological: She is alert and oriented to person, place, and time.  Psychiatric: She has a normal mood and affect. Her behavior is normal. Thought content is paranoid. She is noncommunicative.    Review of Systems  Constitutional: Negative for fever and malaise/fatigue.  HENT: Negative for congestion and sore throat.   Eyes: Negative for blurred vision, discharge and redness.  Respiratory: Negative for cough, shortness of breath and wheezing.   Cardiovascular: Negative for chest pain and palpitations.   Gastrointestinal: Negative for abdominal pain, constipation, diarrhea, heartburn, nausea and vomiting.  Genitourinary: Negative for dysuria and urgency.  Musculoskeletal: Negative for falls and myalgias.  Skin: Negative for rash.  Neurological: Negative for dizziness, seizures, loss of consciousness, weakness and headaches.  Endo/Heme/Allergies: Negative for environmental allergies.  Psychiatric/Behavioral: Positive for depression and hallucinations. Negative for substance abuse and suicidal ideas. The patient is nervous/anxious and has insomnia.   All other systems reviewed and are negative.   Blood pressure (!) 144/85, pulse (!) 113, temperature 98.3 F (36.8 C), temperature source Oral, resp. rate 16, height 5\' 4"  (1.626 m).There is no height or weight on file to calculate BMI.  General Appearance: Fairly Groomed  Eye Contact:  Fair  Speech:  Pressured  Volume:  Normal  Mood:  Euphoric  Affect:  Restricted  Thought Process:  Coherent  Orientation:  Full (Time, Place, and Person)  Thought Content:  Delusions, Hallucinations: Auditory Visual, Paranoid Ideation and Rumination sees and hears satan and angels- IMPROVING  Suicidal Thoughts:  No but is very paranoid- potential danger to self or others  Homicidal Thoughts:  No  Memory:  Immediate;   Fair Recent;   Fair Remote;   Fair  Judgement:  Impaired  Insight:  Lacking  Psychomotor Activity:  Normal  Concentration:  Concentration: Poor  Recall:  Poor  Fund of Knowledge:  Good  Language:  Good  Akathisia:  No  Handed:  Right  AIMS (if indicated):     Assets:  Communication Skills Desire for Improvement Resilience  ADL's:  Intact  Cognition:  WNL  Sleep:  Number of Hours: 4     Treatment Plan Summary: Daily contact with patient to assess and evaluate symptoms and progress in treatment and Medication management   Mood Stabilization:  Depakote ER 1000 mg po Q hs For mood stabilization.Depakote level reviewed -  therapeutic . Discontinued Abilify for lack of tolerability.  Will increase Invega to 6 mg po qhs for psychosis. Will continue to taper down geodon. Plan to DC Patient on an LAI . Will continue Doxepin 10 mg po qhs prn for sleep. Trazodone was discontinued for SE. Continue Cogentin 0.5 mg po BID  PRN for EPS . Continue Vistaril 25 mg POI TID prn for anxiety Continue Glucophage 500 mg PO daily for type 2 diabetes IncreaseD Ditropan XL to 10 mgPO  daily for overactive bladder . UA - wnl. Will continue to monitor vitals ,medication compliance and treatment side effects while patient is here.  CSW will continue to work on disposition. Pt to be referred to ACTT . Patient to participate in therapeutic milieu  I certify that the services received since the previous certification/recertification were and continue to be medically necessary as the treatment provided can be reasonably expected to improve the patient's condition; the medical record documents that the services furnished were intensive treatment services or their equivalent services, and this patient continues to need, on a daily basis, active treatment furnished directly by or requiring the supervision of inpatient psychiatric personnel.   Daielle Melcher 08/02/2015 12:48 PM

## 2015-08-02 NOTE — Progress Notes (Signed)
Patient ID: [name] [name], female   DOB: [birth year], 35 y.o.   MRN: [id number] D: client visible on unit, seen in dayroom watching TV, but not interaction noted, initially refused to speak with Probation officer, then apologized. Client expresses concern about taking medications. "I believe I'm taking to much" Client reports "I haven't been this sick in a long time" "I was going to [employer], when asked if she had been off her medications. Client reluctant to take medications tonight, A: Writer provided emotional support, reviewed medications, with much encouragement able to administer medications as ordered. Staff will monitor q22min for safety. R: Client is safe on the unit, did not attend karaoke.

## 2015-08-02 NOTE — Progress Notes (Signed)
Recreation Therapy Notes  08/02/15 1430:  LRT met with pt to play cards.  Pt taught LRT how to play rummy.  Pt was bright and upbeat.  Pt was less focused on religious things and more focused on the card game.  Pt was clear in her instructions and would also show examples of how to make your books in the game.  LRT will follow up with pt.  Victorino Sparrow, LRT/CTRS     Victorino Sparrow A 08/02/2015 3:25 PM

## 2015-08-02 NOTE — Progress Notes (Signed)
Recreation Therapy Notes  08/02/15 1027 am:  Pt was a little flat at first.  Pt stated she was happy to be alive.  Pt also mentioned she has to forgive people who hurt others intentionally.  LRT gave pt some biblical word searches which she was happy to receive.  Pt even perked up.  Pt was hyper religious.  LRT will follow up with pt this afternoon.  Victorino Sparrow, LRT/CTRS     Victorino Sparrow A 08/02/2015 12:17 PM

## 2015-08-03 MED ORDER — ZIPRASIDONE MESYLATE 20 MG IM SOLR: 10.0000 mg | Freq: Two times a day (BID) | INTRAMUSCULAR | Status: DC | PRN

## 2015-08-03 MED ORDER — ZIPRASIDONE HCL 20 MG PO CAPS
20.0000 mg | ORAL_CAPSULE | Freq: Two times a day (BID) | ORAL | Status: DC | PRN
Start: 2015-08-03 — End: 2015-08-08

## 2015-08-03 MED ORDER — LORAZEPAM 2 MG/ML IJ SOLN
1.0000 mg | Freq: Four times a day (QID) | INTRAMUSCULAR | Status: DC | PRN
Start: 2015-08-03 — End: 2015-08-08

## 2015-08-03 MED ORDER — HYDROCERIN EX CREA
TOPICAL_CREAM | Freq: Three times a day (TID) | CUTANEOUS | Status: DC
  Administered 2015-08-03 – 2015-08-06 (×8): via TOPICAL
  Filled 2015-08-03: qty 113

## 2015-08-03 MED ORDER — LORAZEPAM 1 MG PO TABS
1.0000 mg | ORAL_TABLET | Freq: Four times a day (QID) | ORAL | Status: DC | PRN
  Filled 2015-08-03: qty 1

## 2015-08-03 NOTE — Progress Notes (Signed)
Adult Psychoeducational Group Note  Date:  08/03/2015 Time:  8:48 PM  Group Topic/Focus:  Wrap-Up Group:   The focus of this group is to help patients review their daily goal of treatment and discuss progress on daily workbooks.   Participation Level:  Active  Participation Quality:  Appropriate  Affect:  Appropriate  Cognitive:  Appropriate  Insight: Appropriate  Engagement in Group:  Engaged  Modes of Intervention:  Discussion  Additional Comments:  The patient expressed that she attended groups. Nash Shearer 08/03/2015, 8:48 PM

## 2015-08-03 NOTE — Progress Notes (Signed)
D: Pt denies SI/HI/AVH. Pt is pleasant and cooperative. Pt very concerned that the Dr has not told her when she was leaving. Pt is intrusive, irritable and has no insight, but is redirectable. Pt observed interacting on the milieu and talking on the phone.   A: Pt was offered support and encouragement. Pt was given scheduled medications. Pt was encourage to attend groups. Q 15 minute checks were done for safety.   R:Pt attends groups and interacts well with peers and staff. Pt is taking medication. Pt has no complaints.Pt receptive to treatment and safety maintained on unit.

## 2015-08-03 NOTE — Progress Notes (Signed)
[facility] MD Progress Note  08/03/2015 10:34 AM [name] [name]  MRN:  [id number]   Subjective:  Patient states " I want to be discharged. You are building me up and breaking me down just by one word " you asked me to do better." .     Objective: Patient seen and chart reviewed.Discussed patient with treatment team.    [name] [name] is irritable , angry and labile. Patient is loud - asking for a discharge date. Disposition plan was discussed with patient. Pt per staff appeared to be very agitated last night - paranoid , calling writer "evil" . Pt required PRN medications to calm her down ( Haldol and benadryl IM) . Pt had several medication changes since she were admitted- was started on geodon , tapered off, started on Abilify ( reported she did not wanted to be on it since it kept her awake at night) - agreed to [place] ( according to her worked well in the past) . Pt agreed to an LAI - (has been ordered). Pt continues to need encouragement and support.     Principal Problem: Schizoaffective disorder, bipolar type ([name]) Diagnosis:   Patient Active Problem List   Diagnosis Date Noted  . Insomnia [G47.00]   . Anxiety state [F41.1]   . Overactive bladder [N32.81]   . Diabetes mellitus ([place]) [E11.9] 02/08/2015  . Schizoaffective disorder, bipolar type ([place]) [F25.0] 01/28/2015  . Non compliance w medication regimen [Z91.14]    Total Time spent with patient: 25 minutes  Past Psychiatric History: See Above  Past Medical History:  Past Medical History:  Diagnosis Date  . Bipolar affective disorder, currently manic, mild ([place])   . Diabetes mellitus without complication ([place])   . Schizophrenia ([place])    History reviewed. No pertinent surgical history. Family History:  Family History  Problem Relation Age of Onset  . Drug abuse Maternal Uncle     Social History:  History  Alcohol Use No     History  Drug use: Unknown    Social History   Social History  . Marital  status: Legally Separated    Spouse name: N/A  . Number of children: N/A  . Years of education: N/A   Social History Main Topics  . Smoking status: Never Smoker  . Smokeless tobacco: None  . Alcohol use No  . Drug use: Unknown  . Sexual activity: Not Asked   Other Topics Concern  . None   Social History Narrative  . None   Additional Social History:    Sleep: Fair  Appetite:  Fair  Current Medications: Current Facility-Administered Medications  Medication Dose Route Frequency Provider Last Rate Last Dose  . acetaminophen (TYLENOL) tablet 650 mg  650 mg Oral Q6H PRN Patrecia Pour, NP   650 mg at 07/22/15 0956  . alum & mag hydroxide-simeth (MAALOX/MYLANTA) 200-200-20 MG/5ML suspension 30 mL  30 mL Oral Q4H PRN Patrecia Pour, NP   30 mL at 07/30/15 2114  . benztropine (COGENTIN) tablet 0.5 mg  0.5 mg Oral BID PRN Hampton Abbot, MD      . divalproex (DEPAKOTE ER) 24 hr tablet 1,000 mg  1,000 mg Oral QHS Patrecia Pour, NP   500 mg at 08/02/15 1859  . docusate sodium (COLACE) capsule 100 mg  100 mg Oral Daily Kerrie Buffalo, NP   100 mg at 08/02/15 0803  . doxepin (SINEQUAN) capsule 10 mg  10 mg Oral QHS PRN Ursula Alert, MD      .  hydrocerin (EUCERIN) cream   Topical TID Ursula Alert, MD      . hydrOXYzine (ATARAX/VISTARIL) tablet 25 mg  25 mg Oral TID PRN Patrecia Pour, NP   25 mg at 07/22/15 2144  . magnesium hydroxide (MILK OF MAGNESIA) suspension 30 mL  30 mL Oral Daily PRN Patrecia Pour, NP   30 mL at 07/27/15 0912  . metFORMIN (GLUCOPHAGE) tablet 500 mg  500 mg Oral Q breakfast Patrecia Pour, NP   500 mg at 08/02/15 [id number]  . multivitamin with minerals tablet 1 tablet  1 tablet Oral Daily Kerrie Buffalo, NP   1 tablet at 08/02/15 0803  . naproxen (NAPROSYN) tablet 500 mg  500 mg Oral Q12H PRN Kerrie Buffalo, NP   500 mg at 07/29/15 2054  . oxybutynin (DITROPAN-XL) 24 hr tablet 10 mg  10 mg Oral QHS Ursula Alert, MD   10 mg at 08/02/15 2115  . [START ON 09/07/2015]  paliperidone (INVEGA SUSTENNA) injection 117 mg  117 mg Intramuscular Q28 days Ursula Alert, MD      . Derrill Memo ON 08/10/2015] paliperidone (INVEGA SUSTENNA) injection 156 mg  156 mg Intramuscular Once Ursula Alert, MD      . Derrill Memo ON 08/05/2015] paliperidone (INVEGA SUSTENNA) injection 234 mg  234 mg Intramuscular Once Rayana Geurin, MD      . paliperidone (INVEGA) 24 hr tablet 6 mg  6 mg Oral QHS Ursula Alert, MD   6 mg at 08/02/15 2115  . polyethylene glycol (MIRALAX / GLYCOLAX) packet 17 g  17 g Oral BID Kerrie Buffalo, NP   17 g at 08/02/15 1646    Lab Results:  Results for orders placed or performed during the hospital encounter of 07/21/15 (from the past 48 hour(s))  Glucose, capillary     Status: None   Collection Time: 08/01/15  5:21 PM  Result Value Ref Range   Glucose-Capillary 68 65 - 99 mg/dL   Comment 1 Notify RN    Comment 2 Document in Chart   Glucose, capillary     Status: Abnormal   Collection Time: 08/02/15  6:30 AM  Result Value Ref Range   Glucose-Capillary 101 (H) 65 - 99 mg/dL    Blood Alcohol level:  Lab Results  Component Value Date   ETH <5 07/21/2015   ETH <5 0000000    Metabolic Disorder Labs: Lab Results  Component Value Date   HGBA1C 5.6 07/25/2015   MPG 114 07/25/2015   MPG 137 02/01/2015   Lab Results  Component Value Date   PROLACTIN 46.5 (H) 07/25/2015   PROLACTIN 21.6 02/01/2015   Lab Results  Component Value Date   CHOL 135 07/25/2015   TRIG 59 07/25/2015   HDL 50 07/25/2015   CHOLHDL 2.7 07/25/2015   VLDL 12 07/25/2015   LDLCALC 73 07/25/2015   LDLCALC 78 02/01/2015    Physical Findings: AIMS: Facial and Oral Movements Muscles of Facial Expression: None, normal Lips and Perioral Area: None, normal Jaw: None, normal Tongue: None, normal,Extremity Movements Upper (arms, wrists, hands, fingers): None, normal Lower (legs, knees, ankles, toes): None, normal, Trunk Movements Neck, shoulders, hips: None, normal, Overall  Severity Severity of abnormal movements (highest score from questions above): None, normal Incapacitation due to abnormal movements: None, normal Patient's awareness of abnormal movements (rate only patient's report): No Awareness, Dental Status Current problems with teeth and/or dentures?: No Does patient usually wear dentures?: No  CIWA:    COWS:     Musculoskeletal: Strength &  Muscle Tone: within normal limits Gait & Station: normal Patient leans: N/A  Psychiatric Specialty Exam: Physical Exam  Nursing note and vitals reviewed. Constitutional: She is oriented to person, place, and time.  Musculoskeletal: Normal range of motion.  Neurological: She is alert and oriented to person, place, and time.  Psychiatric: She has a normal mood and affect. Her behavior is normal. Thought content is paranoid. She is noncommunicative.    Review of Systems  Constitutional: Negative for fever and malaise/fatigue.  HENT: Negative for congestion and sore throat.   Eyes: Negative for blurred vision, discharge and redness.  Respiratory: Negative for cough, shortness of breath and wheezing.   Cardiovascular: Negative for chest pain and palpitations.  Gastrointestinal: Negative for abdominal pain, constipation, diarrhea, heartburn, nausea and vomiting.  Genitourinary: Negative for dysuria and urgency.  Musculoskeletal: Negative for falls and myalgias.  Skin: Negative for rash.  Neurological: Negative for dizziness, seizures, loss of consciousness, weakness and headaches.  Endo/Heme/Allergies: Negative for environmental allergies.  Psychiatric/Behavioral: Positive for depression and hallucinations. Negative for substance abuse and suicidal ideas. The patient is nervous/anxious and has insomnia.   All other systems reviewed and are negative.   Blood pressure (!) 144/85, pulse (!) 113, temperature 98.3 F (36.8 C), temperature source Oral, resp. rate 16, height 5\' 4"  (1.626 m).There is no height or  weight on file to calculate BMI.  General Appearance: Fairly Groomed  Eye Contact:  Fair  Speech:  Pressured  Volume:  Increased  Mood:  Angry, Anxious, Euphoric and Irritable  Affect:  Labile  Thought Process:  Coherent  Orientation:  Full (Time, Place, and Person)  Thought Content:  Delusions, Hallucinations: Auditory Visual, Paranoid Ideation and Rumination sees and hears satan and angels  Suicidal Thoughts:  No but is very paranoid- potential danger to self or others  Homicidal Thoughts:  No  Memory:  Immediate;   Fair Recent;   Fair Remote;   Fair  Judgement:  Impaired  Insight:  Lacking  Psychomotor Activity:  Normal  Concentration:  Concentration: Poor  Recall:  Poor  Fund of Knowledge:  Good  Language:  Good  Akathisia:  No  Handed:  Right  AIMS (if indicated):     Assets:  Communication Skills Desire for Improvement Resilience  ADL's:  Intact  Cognition:  WNL  Sleep:  Number of Hours: 6.75     Treatment Plan Summary: Daily contact with patient to assess and evaluate symptoms and progress in treatment and Medication management   Mood Stabilization:  Depakote ER 1000 mg po Q hs For mood stabilization.Depakote level reviewed - therapeutic . Discontinued Abilify for lack of tolerability. Will increase Invega to 6 mg po qhs for psychosis.Plan to DC Patient on an LAI .First dose of [place] IM 234 mg on 08/05/15. Will continue Doxepin 10 mg po qhs prn for sleep. Trazodone was discontinued for SE. Continue Cogentin 0.5 mg po BID  PRN for EPS . Continue Vistaril 25 mg POI TID prn for anxiety Continue Glucophage 500 mg PO daily for type 2 diabetes Increased Ditropan XL to 10 mgPO  daily for overactive bladder . UA - wnl. Will continue to monitor vitals ,medication compliance and treatment side effects while patient is here.  CSW will continue to work on disposition. Pt to be referred to ACTT . Patient to participate in therapeutic milieu  I certify that the  services received since the previous certification/recertification were and continue to be medically necessary as the treatment provided can be reasonably  expected to improve the patient's condition; the medical record documents that the services furnished were intensive treatment services or their equivalent services, and this patient continues to need, on a daily basis, active treatment furnished directly by or requiring the supervision of inpatient psychiatric personnel.   Abdias Hickam 08/03/2015 10:34 AM

## 2015-08-03 NOTE — Progress Notes (Addendum)
Patient has spent all day in her room. She has refused to go to lunch, dinner, groups, or come get her medications. All medications have been refused. Meals have been brought to her from the kitchen, but she has eaten little. Patient continues to C/O urinary frequency/urgency.. MD notified.  Patient had a visit early in the shift from the public defenders office. Patient stated, "I'm not sick, I don't need to be here and I don't need to take any medication".  Patient did request pain medication for a "headache", when nurse can to check on her. Reported pain of 8/10 at 1300 hours. Patient reports 0 pain at 1400 hours.    Patient displaying paranoia. When nurse sat in her room to talk with her, patient stated, "You are not going to use that to record me, are you?". Patient pointed to nurses badge. Patient was given reassurance. We then discussed the different objects on the walls and ceiling, as patient believed these to also be recording devises.

## 2015-08-03 NOTE — Progress Notes (Signed)
Patient ID: [name] [name], female   DOB: 1980-11-20, 35 y.o.   MRN: [id number] PER STATE REGULATIONS 482.30  THIS CHART WAS REVIEWED FOR MEDICAL NECESSITY WITH RESPECT TO THE PATIENT'S ADMISSION/ DURATION OF STAY.  NEXT REVIEW DATE: 08/07/2015  Chauncy Lean, RN, BSN CASE MANAGER'

## 2015-08-03 NOTE — Tx Team (Signed)
Interdisciplinary Treatment Plan Update (Adult)  Date:  08/03/2015 Time Reviewed:  9:48 AM  Progress in Treatment: Attending groups: Yes. Participating in groups:  Yes. Taking medication as prescribed:  Yes. Tolerating medication:  Yes. Family/Significant othe contact made:  No, will contact:  not yet Patient understands diagnosis:  Yes. Discussing patient identified problems/goals with staff:  Yes. Medical problems stabilized or resolved:  Yes. Denies suicidal/homicidal ideation: Yes. Issues/concerns per patient self-inventory:  Yes. Other:  New problem(s) identified: No, Describe:  NA  Discharge Plan or Barriers: Pt plans to return home and follow up with outpatient.    Reason for Continuation of Hospitalization: Medication stabilization Other; describe Psychosis  Comments:  Estimated length of stay: 2-4 days   New goal(s): NA  Review of initial/current patient goals per problem list:   1.  Goal(s): Patient will participate in aftercare plan * Met:  * Target date: at discharge * As evidenced by: Patient will participate within aftercare plan AEB aftercare provider and housing plan at discharge being identified.  2.  Goal (s): Patient will demonstrate decreased symptoms of psychosis. * Met: No  *  Target date: at discharge * As evidenced by: Patient will not endorse signs of psychosis or be deemed stable for discharge by MD.    Attendees: Patient:  [name] [name] 7/28/20179:48 AM  Family:   7/28/20179:48 AM  Physician:  Ursula Alert, MD  7/28/20179:48 AM  Nursing:   Julienne Kass, RN  7/28/20179:48 AM  Case Manager:   7/28/20179:48 AM  Counselor:   7/28/20179:48 AM  Other:  [place] 7/28/20179:48 AM  Other:   7/28/20179:48 AM  Other:   7/28/20179:48 AM  Other:  7/28/20179:48 AM  Other:  7/28/20179:48 AM  Other:  7/28/20179:48 AM  Other:  7/28/20179:48 AM  Other:  7/28/20179:48 AM  Other:  7/28/20179:48 AM  Other:   7/28/20179:48 AM    Scribe for Treatment Team:   Campbell Stall Teja Judice,MSW, [place]  08/03/2015, 9:48 AM

## 2015-08-03 NOTE — Plan of Care (Signed)
Problem: Food- and Nutrition-Related Knowledge Deficit (NB-1.1) Goal: Nutrition education Formal process to instruct or train a patient/client in a skill or to impart knowledge to help patients/clients voluntarily manage or modify food choices and eating behavior to maintain or improve health. Outcome: Completed/Met Date Met: 08/03/15  RD consulted for nutrition education regarding diabetes.   Lab Results  Component Value Date   HGBA1C 5.6 07/25/2015    RD provided "Carbohydrate Counting for People with Diabetes" handout from the Academy of Nutrition and Dietetics. Discussed different food groups and their effects on blood sugar, emphasizing carbohydrate-containing foods. Provided list of carbohydrates and recommended serving sizes of common foods.  Discussed importance of controlled and consistent carbohydrate intake throughout the day. Provided examples of ways to balance meals/snacks and encouraged intake of high-fiber, whole grain complex carbohydrates. Teach back method used.  Expect fair compliance.  Current diet order is regular, patient is consuming an unknown percentage of meals at this time. Labs and medications reviewed. No further nutrition interventions warranted at this time. RD contact information provided. If additional nutrition issues arise, please re-consult RD.  Satira Anis. Tajha Sammarco, MS, RD LDN Inpatient Clinical Dietitian Pager [phone]

## 2015-08-03 NOTE — Plan of Care (Signed)
Problem: Safety: Goal: Periods of time without injury will increase Outcome: Progressing Client has remained free from injury AEB q9min safety checks, agreeing to take medications this shift and denying harmful thoughts.

## 2015-08-03 NOTE — B[facility] Group Notes (Signed)
[place] LCSW Group Therapy  08/03/2015 4:05 PM   Type of Therapy:  Group Therapy  Participation Level:  Invited, chose not to attend  Summary of Progress/Problems:  Chaplain led group explored concept of hope and its relevance to mental health recovery.  Patients explored themes including what matters to them personally, how others responses are similar/different, and what they are hopeful for.  Group members discussed relevance of social supports, innter strength and using their own stories to craft a recovery path.    [name] [name]

## 2015-08-04 LAB — GLUCOSE, CAPILLARY: GLUCOSE-CAPILLARY: 92 mg/dL (ref 65–99)

## 2015-08-04 MED ORDER — PANTOPRAZOLE SODIUM 40 MG PO TBEC
DELAYED_RELEASE_TABLET | ORAL | Status: AC
  Administered 2015-08-04: 09:00:00
  Filled 2015-08-04: qty 1

## 2015-08-04 MED ORDER — PANTOPRAZOLE SODIUM 40 MG PO TBEC
40.0000 mg | DELAYED_RELEASE_TABLET | Freq: Every day | ORAL | Status: DC
  Administered 2015-08-05 – 2015-08-07 (×3): 40 mg via ORAL
  Filled 2015-08-04 (×3): qty 1
  Filled 2015-08-04: qty 5
  Filled 2015-08-04 (×2): qty 1

## 2015-08-04 NOTE — Progress Notes (Signed)
Patient complained of chest pain this AM during medication pass. Patient stated the pain was in her left chest and was dull and not relieved by anything. Patients VS taken. P. 117. BP. 139/69. R. 20. T. 97.8 O2.sat. 98% Patient not diaphoretic. NP notified. Based on NP assessment order received for Protonix. Patient received protoxix PO and patient reports decrease in pain 30 minutes later. Patient continues to deny pain through out the shift. Patient has been somewhat intrusive throughout shift. She and another patient had a very loud conversation, in the hallway,  about Jesus and other religious topics. She has been noted with a very large smile throughout the day, and been observed dancing in the halls. All responses to questions/interactions are enthusiastic, with lots of laughter and embellishments. She often interupts other conversations to interject her thoughts, or to ask off topic questions.

## 2015-08-04 NOTE — Progress Notes (Signed)
D.  Pt pleasant on approach, continues to be guarded. No complaints voiced.  Pt did not attend evening wrap up group.  Pt did take medications as ordered.  Pt denies SI/HI but has experienced some auditory hallucinations per day shift.  A.  Support and encouragement offered, medication given as ordered  R.  Pt remains safe on the unit, will continue to monitor.

## 2015-08-04 NOTE — Progress Notes (Signed)
Pt did not attend group this evening.  

## 2015-08-04 NOTE — Progress Notes (Signed)
Patient ID: [name] [name], female   DOB: February 17, 1980, 35 y.o.   MRN: [id number] [facility] MD Progress Note  08/04/2015 12:18 PM Floriana Corbridge  MRN:  [id number]   Subjective:  I feel I'm ready to be discharged, I should've been at church helping the pastor, I do a lot of things at church  Objective: Patient seen and chart reviewed.    Keirstyn Murray-Taylor is laughing, wearing lipstick, stating that she feels she is doing better, feels she needs to be at church, adds that they cannot function without her, states that she has multiple roles there and does not need to be here. Patient was noted to have mild pressured speech, required redirection, still feels at times people are watching her. Patient denies any side effects of the medications Pt continues to need encouragement and support.   Principal Problem: Schizoaffective disorder, bipolar type ([place]) Diagnosis:   Patient Active Problem List   Diagnosis Date Noted  . Insomnia [G47.00]   . Anxiety state [F41.1]   . Overactive bladder [N32.81]   . Diabetes mellitus ([place]) [E11.9] 02/08/2015  . Schizoaffective disorder, bipolar type ([place]) [F25.0] 01/28/2015  . Non compliance w medication regimen [Z91.14]    Total Time spent with patient: 25 minutes  Past Psychiatric History: See Above  Past Medical History:  Past Medical History:  Diagnosis Date  . Bipolar affective disorder, currently manic, mild ([place])   . Diabetes mellitus without complication ([place])   . Schizophrenia ([place])    History reviewed. No pertinent surgical history. Family History:  Family History  Problem Relation Age of Onset  . Drug abuse Maternal Uncle     Social History:  History  Alcohol Use No     History  Drug use: Unknown    Social History   Social History  . Marital status: Legally Separated    Spouse name: N/A  . Number of children: N/A  . Years of education: N/A   Social History Main Topics  . Smoking status: Never Smoker  . Smokeless  tobacco: None  . Alcohol use No  . Drug use: Unknown  . Sexual activity: Not Asked   Other Topics Concern  . None   Social History Narrative  . None   Additional Social History:    Sleep: Fair  Appetite:  Fair  Current Medications: Current Facility-Administered Medications  Medication Dose Route Frequency Provider Last Rate Last Dose  . acetaminophen (TYLENOL) tablet 650 mg  650 mg Oral Q6H PRN Patrecia Pour, NP   650 mg at 08/03/15 2124  . alum & mag hydroxide-simeth (MAALOX/MYLANTA) 200-200-20 MG/5ML suspension 30 mL  30 mL Oral Q4H PRN Patrecia Pour, NP   30 mL at 07/30/15 2114  . benztropine (COGENTIN) tablet 0.5 mg  0.5 mg Oral BID PRN Hampton Abbot, MD      . divalproex (DEPAKOTE ER) 24 hr tablet 1,000 mg  1,000 mg Oral QHS Patrecia Pour, NP   1,000 mg at 08/03/15 2124  . docusate sodium (COLACE) capsule 100 mg  100 mg Oral Daily Kerrie Buffalo, NP   100 mg at 08/04/15 0805  . doxepin (SINEQUAN) capsule 10 mg  10 mg Oral QHS PRN Ursula Alert, MD      . hydrocerin (EUCERIN) cream   Topical TID Ursula Alert, MD      . hydrOXYzine (ATARAX/VISTARIL) tablet 25 mg  25 mg Oral TID PRN Patrecia Pour, NP   25 mg at 07/22/15 2144  . LORazepam (ATIVAN) tablet 1  mg  1 mg Oral Q6H PRN Ursula Alert, MD       Or  . LORazepam (ATIVAN) injection 1 mg  1 mg Intramuscular Q6H PRN Saramma Eappen, MD      . magnesium hydroxide (MILK OF MAGNESIA) suspension 30 mL  30 mL Oral Daily PRN Patrecia Pour, NP   30 mL at 07/27/15 0912  . metFORMIN (GLUCOPHAGE) tablet 500 mg  500 mg Oral Q breakfast Patrecia Pour, NP   500 mg at 08/04/15 0805  . multivitamin with minerals tablet 1 tablet  1 tablet Oral Daily Kerrie Buffalo, NP   1 tablet at 08/04/15 0807  . naproxen (NAPROSYN) tablet 500 mg  500 mg Oral Q12H PRN Kerrie Buffalo, NP   500 mg at 07/29/15 2054  . oxybutynin (DITROPAN-XL) 24 hr tablet 10 mg  10 mg Oral QHS Ursula Alert, MD   10 mg at 08/03/15 2124  . [START ON 09/07/2015]  paliperidone (INVEGA SUSTENNA) injection 117 mg  117 mg Intramuscular Q28 days Ursula Alert, MD      . Derrill Memo ON 08/10/2015] paliperidone (INVEGA SUSTENNA) injection 156 mg  156 mg Intramuscular Once Ursula Alert, MD      . Derrill Memo ON 08/05/2015] paliperidone (INVEGA SUSTENNA) injection 234 mg  234 mg Intramuscular Once Saramma Eappen, MD      . paliperidone (INVEGA) 24 hr tablet 6 mg  6 mg Oral QHS Ursula Alert, MD   6 mg at 08/03/15 2124  . pantoprazole (PROTONIX) EC tablet 40 mg  40 mg Oral Daily Nanci Pina, FNP      . polyethylene glycol (MIRALAX / GLYCOLAX) packet 17 g  17 g Oral BID Kerrie Buffalo, NP   17 g at 08/02/15 1646  . ziprasidone (GEODON) capsule 20 mg  20 mg Oral BID PRN Ursula Alert, MD       Or  . ziprasidone (GEODON) injection 10 mg  10 mg Intramuscular BID PRN Ursula Alert, MD        Lab Results:  Results for orders placed or performed during the hospital encounter of 07/21/15 (from the past 48 hour(s))  Glucose, capillary     Status: None   Collection Time: 08/04/15 12:15 PM  Result Value Ref Range   Glucose-Capillary 92 65 - 99 mg/dL    Blood Alcohol level:  Lab Results  Component Value Date   ETH <5 07/21/2015   ETH <5 0000000    Metabolic Disorder Labs: Lab Results  Component Value Date   HGBA1C 5.6 07/25/2015   MPG 114 07/25/2015   MPG 137 02/01/2015   Lab Results  Component Value Date   PROLACTIN 46.5 (H) 07/25/2015   PROLACTIN 21.6 02/01/2015   Lab Results  Component Value Date   CHOL 135 07/25/2015   TRIG 59 07/25/2015   HDL 50 07/25/2015   CHOLHDL 2.7 07/25/2015   VLDL 12 07/25/2015   LDLCALC 73 07/25/2015   LDLCALC 78 02/01/2015    Physical Findings: AIMS: Facial and Oral Movements Muscles of Facial Expression: None, normal Lips and Perioral Area: None, normal Jaw: None, normal Tongue: None, normal,Extremity Movements Upper (arms, wrists, hands, fingers): None, normal Lower (legs, knees, ankles, toes): None, normal,  Trunk Movements Neck, shoulders, hips: None, normal, Overall Severity Severity of abnormal movements (highest score from questions above): None, normal Incapacitation due to abnormal movements: None, normal Patient's awareness of abnormal movements (rate only patient's report): No Awareness, Dental Status Current problems with teeth and/or dentures?: No Does patient usually  wear dentures?: No  CIWA:    COWS:     Musculoskeletal: Strength & Muscle Tone: within normal limits Gait & Station: normal Patient leans: N/A  Psychiatric Specialty Exam: Physical Exam  Nursing note and vitals reviewed. Constitutional: She is oriented to person, place, and time.  Neurological: She is alert and oriented to person, place, and time.  Psychiatric: She has a normal mood and affect. Her behavior is normal. Thought content is paranoid and delusional.    Review of Systems  Constitutional: Negative for fever and malaise/fatigue.  HENT: Negative for congestion and sore throat.   Eyes: Negative for blurred vision, discharge and redness.  Respiratory: Negative for cough, shortness of breath and wheezing.   Cardiovascular: Negative for chest pain and palpitations.  Gastrointestinal: Negative for abdominal pain, constipation, diarrhea, heartburn, nausea and vomiting.  Genitourinary: Negative for dysuria and urgency.  Musculoskeletal: Negative for falls and myalgias.  Skin: Negative for rash.  Neurological: Negative for dizziness, seizures, loss of consciousness, weakness and headaches.  Endo/Heme/Allergies: Negative for environmental allergies.  Psychiatric/Behavioral: Positive for depression. Negative for hallucinations, substance abuse and suicidal ideas. The patient is nervous/anxious and has insomnia.   All other systems reviewed and are negative.   Blood pressure (!) 144/85, pulse (!) 113, temperature 98.3 F (36.8 C), temperature source Oral, resp. rate 16, height 5\' 4"  (1.626 m).There is no height  or weight on file to calculate BMI.  General Appearance: Fairly Groomed  Eye Contact:  Fair  Speech:  Pressured  Volume:  Increased  Mood:  Angry, Anxious, Euphoric and Irritable  Affect:  Labile  Thought Process:  Coherent  Orientation:  Full (Time, Place, and Person)  Thought Content:  Delusions, Hallucinations: Auditory Visual, Paranoid Ideation and Rumination sees and hears satan and angels,Hyperreligious this morning   Suicidal Thoughts:  No but is very paranoid- potential danger to self or others  Homicidal Thoughts:  No  Memory:  Immediate;   Fair Recent;   Fair Remote;   Fair  Judgement:  Impaired  Insight:  Lacking  Psychomotor Activity:  Normal  Concentration:  Concentration: Poor  Recall:  Poor  Fund of Knowledge:  Good  Language:  Good  Akathisia:  No  Handed:  Right  AIMS (if indicated):     Assets:  Communication Skills Desire for Improvement Resilience  ADL's:  Intact  Cognition:  WNL  Sleep:  Number of Hours: 6.25     Treatment Plan Summary: Daily contact with patient to assess and evaluate symptoms and progress in treatment and Medication management  Will continue to monitor vitals ,medication compliance and treatment side effects. CSW will continue to work on disposition. Pt to be referred to ACTT . Patient to work on her coping skills, medication compliance issues, to participate in therapeutic milieu Continue Depakote ER 1000 mg po Q hs For mood stabilization.Depakote level reviewed - therapeutic . Continue Invega to 6 mg po qhs for psychosis.Plan to DC Patient on an LAI .First dose of [place] IM 234 mg on 08/05/15. Will continue Doxepin 10 mg po qhs prn for sleep.  Continue Cogentin 0.5 mg po BID  PRN for EPS . Continue Vistaril 25 mg POI TID prn for anxiety Continue Glucophage 500 mg PO daily for type 2 diabetes Continue Ditropan XL 10 mgPO  daily for overactive bladder .     [place] 08/04/2015 12:18 PM

## 2015-08-04 NOTE — BHH Group Notes (Signed)
[facility] Group Notes:  (Nursing/MHT/Case Management/Adjunct)  Date:  08/04/2015  Time:  6:54 PM  Type of Therapy:  Nurse Education  Participation Level:  Active  Participation Quality:  Intrusive and Redirectable  Affect:  Excited  Cognitive:  Alert and Disorganized  Insight:  Improving  Engagement in Group:  Engaged and Monopolizing  Modes of Intervention:  Problem-solving  Summary of Progress/Problems: Patient attended group and participated fully. At times patient did monopolize, but was redirectable.  Cheri Kearns 08/04/2015, 6:54 PM

## 2015-08-04 NOTE — BHH Group Notes (Signed)
[facility] Group Notes:  (Clinical Social Work)   11/04/2014     10:00-11:00AM  Summary of Progress/Problems:   In today's process group, patients listed one healthy and one unhealthy coping technique they utilize and there was a full discussion about more healthy ways to cope with problems and symptoms, and how to stay well out of the hospital.  Later, a list of 99 coping skills was used to discuss more ideas that they generally had not previously considered.   The patient expressed that the healthy and unhealthy coping she often uses are journaling and listening to others' perspectives instead of looking at her own.  She was initially resistant to group, insisted on being called "[name] [name]."  Later, she became very involved and excited in the conversation.  Type of Therapy:  Group Therapy - Process   Participation Level:  Active  Participation Quality:  Attentive and Sharing  Affect:  Blunted  Cognitive:  Disorganized  Insight:  Developing/Improving  Engagement in Therapy:  Engaged  Modes of Intervention:  Education, Motivational Interviewing  Selmer Dominion, LCSW 08/04/2015, 4:03 PM

## 2015-08-05 NOTE — Progress Notes (Signed)
Patient ID: [name] [name], female   DOB: 05/15/1980, 35 y.o.   MRN: [id number] [facility] MD Progress Note  08/05/2015 1:41 PM [name] [name]  MRN:  [id number]   Subjective: "I don't [facility] to [facility] the church, I feel I need help to get my own place, the condition at the church is not good for me.''  Objective: Patient seen, chart reviewed and case discussed with treatment team. Patient remains labile, euphoric and dresses flamboyantly with excess makeup. Patient has poor boundaries, requires frequent re-directions and remains delusional, grandiose. Yesterday, she was requesting to be [facility] the church but she changed her mind today and requesting to find an independent living for her. Patient was noted to be tangential with pressured speech and still feel people are watching her. Patient denies any side effects of the medications Pt continues to need encouragement and support.   Principal Problem: Schizoaffective disorder, bipolar type ([place]) Diagnosis:   Patient Active Problem List   Diagnosis Date Noted  . Schizoaffective disorder, bipolar type ([place]) [F25.0] 01/28/2015    Priority: High  . Insomnia [G47.00]   . Anxiety state [F41.1]   . Overactive bladder [N32.81]   . Diabetes mellitus ([place]) [E11.9] 02/08/2015  . Non compliance w medication regimen [Z91.14]    Total Time spent with patient: 25 minutes  Past Psychiatric History: See Above  Past Medical History:  Past Medical History:  Diagnosis Date  . Bipolar affective disorder, currently manic, mild ([place])   . Diabetes mellitus without complication ([place])   . Schizophrenia ([place])    History reviewed. No pertinent surgical history. Family History:  Family History  Problem Relation Age of Onset  . Drug abuse Maternal Uncle     Social History:  History  Alcohol Use No     History  Drug use: Unknown    Social History   Social History  . Marital status: Legally Separated    Spouse name: N/A   . Number of children: N/A  . Years of education: N/A   Social History Main Topics  . Smoking status: Never Smoker  . Smokeless tobacco: None  . Alcohol use No  . Drug use: Unknown  . Sexual activity: Not Asked   Other Topics Concern  . None   Social History Narrative  . None   Additional Social History:    Sleep: Fair  Appetite:  Fair  Current Medications: Current Facility-Administered Medications  Medication Dose Route Frequency Provider Last Rate Last Dose  . acetaminophen (TYLENOL) tablet 650 mg  650 mg Oral Q6H PRN Patrecia Pour, NP   650 mg at 08/03/15 2124  . alum & mag hydroxide-simeth (MAALOX/MYLANTA) 200-200-20 MG/5ML suspension 30 mL  30 mL Oral Q4H PRN Patrecia Pour, NP   30 mL at 07/30/15 2114  . benztropine (COGENTIN) tablet 0.5 mg  0.5 mg Oral BID PRN Hampton Abbot, MD      . divalproex (DEPAKOTE ER) 24 hr tablet 1,000 mg  1,000 mg Oral QHS Patrecia Pour, NP   1,000 mg at 08/04/15 2017  . docusate sodium (COLACE) capsule 100 mg  100 mg Oral Daily Kerrie Buffalo, NP   100 mg at 08/05/15 0936  . doxepin (SINEQUAN) capsule 10 mg  10 mg Oral QHS PRN Ursula Alert, MD   10 mg at 08/04/15 2022  . hydrocerin (EUCERIN) cream   Topical TID Ursula Alert, MD      . hydrOXYzine (ATARAX/VISTARIL) tablet 25 mg  25 mg  Oral TID PRN Patrecia Pour, NP   25 mg at 07/22/15 2144  . LORazepam (ATIVAN) tablet 1 mg  1 mg Oral Q6H PRN Ursula Alert, MD       Or  . LORazepam (ATIVAN) injection 1 mg  1 mg Intramuscular Q6H PRN Saramma Eappen, MD      . magnesium hydroxide (MILK OF MAGNESIA) suspension 30 mL  30 mL Oral Daily PRN Patrecia Pour, NP   30 mL at 07/27/15 0912  . metFORMIN (GLUCOPHAGE) tablet 500 mg  500 mg Oral Q breakfast Patrecia Pour, NP   500 mg at 08/05/15 0935  . multivitamin with minerals tablet 1 tablet  1 tablet Oral Daily Kerrie Buffalo, NP   1 tablet at 08/05/15 0935  . naproxen (NAPROSYN) tablet 500 mg  500 mg Oral Q12H PRN Kerrie Buffalo, NP   500 mg at  07/29/15 2054  . oxybutynin (DITROPAN-XL) 24 hr tablet 10 mg  10 mg Oral QHS Ursula Alert, MD   10 mg at 08/04/15 2052  . [START ON 09/07/2015] paliperidone (INVEGA SUSTENNA) injection 117 mg  117 mg Intramuscular Q28 days Ursula Alert, MD      . Derrill Memo ON 08/10/2015] paliperidone (INVEGA SUSTENNA) injection 156 mg  156 mg Intramuscular Once Saramma Eappen, MD      . paliperidone (INVEGA) 24 hr tablet 6 mg  6 mg Oral QHS Ursula Alert, MD   6 mg at 08/04/15 2018  . pantoprazole (PROTONIX) EC tablet 40 mg  40 mg Oral Daily Nanci Pina, FNP   40 mg at 08/05/15 0936  . polyethylene glycol (MIRALAX / GLYCOLAX) packet 17 g  17 g Oral BID Kerrie Buffalo, NP   17 g at 08/05/15 [id number]  . ziprasidone (GEODON) capsule 20 mg  20 mg Oral BID PRN Ursula Alert, MD       Or  . ziprasidone (GEODON) injection 10 mg  10 mg Intramuscular BID PRN Ursula Alert, MD        Lab Results:  Results for orders placed or performed during the hospital encounter of 07/21/15 (from the past 48 hour(s))  Glucose, capillary     Status: None   Collection Time: 08/04/15 12:15 PM  Result Value Ref Range   Glucose-Capillary 92 65 - 99 mg/dL    Blood Alcohol level:  Lab Results  Component Value Date   ETH <5 07/21/2015   ETH <5 0000000    Metabolic Disorder Labs: Lab Results  Component Value Date   HGBA1C 5.6 07/25/2015   MPG 114 07/25/2015   MPG 137 02/01/2015   Lab Results  Component Value Date   PROLACTIN 46.5 (H) 07/25/2015   PROLACTIN 21.6 02/01/2015   Lab Results  Component Value Date   CHOL 135 07/25/2015   TRIG 59 07/25/2015   HDL 50 07/25/2015   CHOLHDL 2.7 07/25/2015   VLDL 12 07/25/2015   LDLCALC 73 07/25/2015   LDLCALC 78 02/01/2015    Physical Findings: AIMS: Facial and Oral Movements Muscles of Facial Expression: None, normal Lips and Perioral Area: None, normal Jaw: None, normal Tongue: None, normal,Extremity Movements Upper (arms, wrists, hands, fingers): None, normal Lower  (legs, knees, ankles, toes): None, normal, Trunk Movements Neck, shoulders, hips: None, normal, Overall Severity Severity of abnormal movements (highest score from questions above): None, normal Incapacitation due to abnormal movements: None, normal Patient's awareness of abnormal movements (rate only patient's report): No Awareness, Dental Status Current problems with teeth and/or dentures?: No Does patient usually  wear dentures?: No  CIWA:    COWS:     Musculoskeletal: Strength & Muscle Tone: within normal limits Gait & Station: normal Patient leans: N/A  Psychiatric Specialty Exam: Physical Exam  Nursing note and vitals reviewed. Constitutional: She is oriented to person, place, and time.  Neurological: She is alert and oriented to person, place, and time.  Psychiatric: She has a normal mood and affect. Her behavior is normal. Thought content is paranoid and delusional.    Review of Systems  Constitutional: Negative for fever and malaise/fatigue.  HENT: Negative for congestion and sore throat.   Eyes: Negative for blurred vision, discharge and redness.  Respiratory: Negative for cough, shortness of breath and wheezing.   Cardiovascular: Negative for chest pain and palpitations.  Gastrointestinal: Negative for abdominal pain, constipation, diarrhea, heartburn, nausea and vomiting.  Genitourinary: Negative for dysuria and urgency.  Musculoskeletal: Negative for falls and myalgias.  Skin: Negative for rash.  Neurological: Negative for dizziness, seizures, loss of consciousness, weakness and headaches.  Endo/Heme/Allergies: Negative for environmental allergies.  Psychiatric/Behavioral: Positive for depression. Negative for hallucinations, substance abuse and suicidal ideas. The patient is nervous/anxious and has insomnia.   All other systems reviewed and are negative.   Blood pressure (!) 144/85, pulse (!) 113, temperature 98.3 F (36.8 C), temperature source Oral, resp. rate 16,  height 5\' 4"  (1.626 m).There is no height or weight on file to calculate BMI.  General Appearance: Fairly Groomed  Eye Contact:  Fair  Speech:  Pressured  Volume:  Increased  Mood:  Angry, Anxious, Euphoric and Irritable  Affect:  Labile  Thought Process:  Coherent  Orientation:  Full (Time, Place, and Person)  Thought Content:  Delusions, Hallucinations: Auditory Visual, Paranoid Ideation and Rumination sees and hears satan and angels,Hyperreligious this morning   Suicidal Thoughts:  No but is very paranoid- potential danger to self or others  Homicidal Thoughts:  No  Memory:  Immediate;   Fair Recent;   Fair Remote;   Fair  Judgement:  Impaired  Insight:  Lacking  Psychomotor Activity:  Normal  Concentration:  Concentration: Poor  Recall:  Poor  Fund of Knowledge:  Good  Language:  Good  Akathisia:  No  Handed:  Right  AIMS (if indicated):     Assets:  Communication Skills Desire for Improvement Resilience  ADL's:  Intact  Cognition:  WNL  Sleep:  Number of Hours: 6.75     Treatment Plan Summary: Daily contact with patient to assess and evaluate symptoms and progress in treatment and Medication management  Will continue to monitor vitals ,medication compliance and treatment side effects. CSW will continue to work on disposition. Pt to be referred to ACTT . Patient to work on her coping skills, medication compliance issues, to participate in therapeutic milieu Continue Depakote ER 1000 mg po Q hs For mood stabilization.Depakote level reviewed - therapeutic . Continue Invega to 6 mg po qhs for psychosis.Plan to DC Patient on an LAI .First dose of [place] IM 234 mg on 08/05/15. Will continue Doxepin 10 mg po qhs prn for sleep.  Continue Cogentin 0.5 mg po BID  PRN for EPS . Continue Vistaril 25 mg POI TID prn for anxiety Continue Glucophage 500 mg PO daily for type 2 diabetes Continue Ditropan XL 10 mgPO  daily for overactive bladder .     Darleene Cleaver,  Aranda Bihm 08/05/2015 1:41 PM

## 2015-08-05 NOTE — [facility] Group Notes (Signed)
[facility] Group Notes: (Clinical Social Work)   08/05/2015      Type of Therapy:  Group Therapy   Participation Level:  Did Not Attend despite MHT prompting   Selmer Dominion, LCSW 08/05/2015, 2:14 PM

## 2015-08-05 NOTE — Progress Notes (Signed)
Adult Psychoeducational Group Note  Date:  08/05/2015 Time:  8:58 PM  Group Topic/Focus:  Wrap-Up Group:   The focus of this group is to help patients review their daily goal of treatment and discuss progress on daily workbooks.   Participation Level:  Did Not Attend  Additional Comments:  Pt was encouraged to come to group, however pt stayed in her room.  Clint Bolder 08/05/2015, 8:58 PM

## 2015-08-05 NOTE — Progress Notes (Signed)
DAR NOTE: Patient presents with anxious affect and depressed mood.  Denies pain, auditory and visual hallucinations.  Rates depression at 6, hopelessness at 6 and anxiety at 6. Pt has been isolating in the room, not interacting much. Maintained on routine safety checks.  Medications given as prescribed. Support and encouragement offered as needed.   Offered no complaint.

## 2015-08-06 LAB — GLUCOSE, CAPILLARY: GLUCOSE-CAPILLARY: 92 mg/dL (ref 65–99)

## 2015-08-06 MED ORDER — PALIPERIDONE ER 6 MG PO TB24
6.0000 mg | ORAL_TABLET | Freq: Every day | ORAL | Status: DC
  Administered 2015-08-06: 6 mg via ORAL
  Filled 2015-08-06: qty 5
  Filled 2015-08-06 (×2): qty 1

## 2015-08-06 MED ORDER — ZOLPIDEM TARTRATE 5 MG PO TABS
5.0000 mg | ORAL_TABLET | Freq: Every day | ORAL | Status: DC
Start: 2015-08-06 — End: 2015-08-08
  Administered 2015-08-06: 5 mg via ORAL
  Filled 2015-08-06: qty 1

## 2015-08-06 NOTE — BHH Group Notes (Signed)
[facility] LCSW Aftercare Discharge Planning Group Note   08/06/2015 1:07 PM  Participation Quality:  Invited, chose not to attend.  States she did not sleep last night and is too drowsy.   [name] [name]

## 2015-08-06 NOTE — Progress Notes (Signed)
Patient ID: [name] [name], female   DOB: 06/20/1980, 35 y.o.   MRN: [id number] D: Patient mostly in her room witting. Pt did not attend evening wrap up group. Pt reports her day was "excellent". Pt reports discharging tomorrow, appears upbeat about that and asking about what medications she will be going home with. Denies  SI/HI/AVH and pain.No behavioral issues noted.  A: Support and encouragement offered as needed. Medications administered as prescribed.  R: Patient is safe. Will continue to monitor patient for safety and stability.

## 2015-08-06 NOTE — B[facility] Group Notes (Signed)
[facility] LCSW Group Therapy  08/06/2015 3:42 PM  Type of Therapy:  Group Therapy  Participation Level:  Active  Participation Quality:  Monopolizing  Affect:  Irritable  Cognitive:  Appropriate  Insight:  Developing/Improving  Engagement in Therapy:  Developing/Improving  Modes of Intervention:  Discussion, Exploration and Problem-solving  Summary of Progress/Problems:  Today's Topic: Overcoming Obstacles. Patients identified one short term goal and potential obstacles in reaching this goal. Patients processed barriers involved in overcoming these obstacles. Patients identified steps necessary for overcoming these obstacles and explored motivation (internal and external) for facing these difficulties head on. Pt appeared to process her desire for independent living, is now stating she does not want to live w pastor.  States she will "give up" her quest for [employer] housing, believes she can pay for her own place which would be more conducive to recovery/wellness.  Requests list of boarding houses but states "I will need help calling places."  Beverely Pace 08/06/2015, 3:42 PM

## 2015-08-06 NOTE — Progress Notes (Signed)
Did not attend group 

## 2015-08-06 NOTE — Progress Notes (Signed)
Recreation Therapy Notes  08/06/15 1026:  Pt was lying down about to take a nap.  Pt stated she didn't get much sleep last night.  Pt stated her weekend was okay and she was grateful to be alive.  Pt was less religious and brighter.  LRT will follow up with pt.  Victorino Sparrow, LRT/CTRS     Victorino Sparrow A 08/06/2015 12:23 PM

## 2015-08-06 NOTE — Progress Notes (Signed)
DAR NOTE: Patient presents with anxious affect and depressed mood.  Denies pain, auditory and visual hallucinations.  Rates depression at 4, hopelessness at 4, and anxiety at 4.  Maintained on routine safety checks.  Medications given as prescribed.  Support and encouragement offered as needed.  Offered no complaint.

## 2015-08-06 NOTE — Clinical Social Work Note (Addendum)
CSW spoke w  Filomena Jungling RN, injection nurse at [facility], to investigate possibliity of scheduling pt for injection on 8/4.  Pt must go through [facility] to establish as a patient at [facility].  Loletha Grayer asks that we inform her when patient discharges so she can facilitate patient access to injectable meds at [facility].  RN is familiar w patient from prior contact w patient in past.  Loletha Grayer will reach out to patient by phone after discharge and encourage pt to present for injection.  Requests [facility] MD fax med order [phone]) so Beverly Sessions MD will know what has been started at [facility].  Pt will also need to be seen by Beverly Sessions MD.    Edwyna Shell, [place] Worker Phone:  [phone]

## 2015-08-06 NOTE — Plan of Care (Signed)
Problem: Activity: Goal: Interest or engagement in activities will improve Outcome: Not Progressing Pt isolate mostly to her room. Pt encouraged to engage in group and milieu activities

## 2015-08-06 NOTE — Progress Notes (Signed)
Recreation Therapy Notes  08/06/15  1306:  LRT followed up with pt and played two card games; speed and Rummy.  Pt was excited and focused.  Pt had to be redirected to bring the noise down because she was getting to loud but she was appropriate.  Pt was not hyper-religious.  LRT will follow up with pt tomorrow.   Victorino Sparrow, LRT/CTRS      [name] [name], [name] [name] 08/06/2015 2:42 PM

## 2015-08-06 NOTE — Clinical Social Work Note (Signed)
Call to [name] [name], injection RN at [facility] [phone]) to discuss scheduling patient's 8/4 Invega injection.  Per patient "I cannot get anywhere, I would need someone to come to my house to give me this."  Informed pt that until an ACT team is in place, she will need to get to appointments, patient insists this is not possible.  CSW will work w patient to problem solve issue  [name] [name], [place] Worker Phone:  [phone]

## 2015-08-06 NOTE — Progress Notes (Addendum)
Patient ID: [name] [name], female   DOB: 12/22/1980, 35 y.o.   MRN: [id number] [facility] MD Progress Note  08/06/2015 12:05 PM [name] [name]  MRN:  [id number]   Subjective: "I am not doing well on the Doxepin , I need something to help me sleep. Ambien has worked well in the past.'   Objective: Patient seen, chart reviewed and case discussed with treatment team. Patient today is seen as calm, less labile than on admission. She continues to report sleep issues , her doxepin is not working well.Discussed Ambien - which has worked well in the past. She remains anxious often ,dresses flamboyantly with excess makeup.  Pt continues to need encouragement and support.   Principal Problem: Schizoaffective disorder, bipolar type ([place]) Diagnosis:   Patient Active Problem List   Diagnosis Date Noted  . Insomnia [G47.00]   . Anxiety state [F41.1]   . Overactive bladder [N32.81]   . Diabetes mellitus ([place]) [E11.9] 02/08/2015  . Schizoaffective disorder, bipolar type ([place]) [F25.0] 01/28/2015  . Non compliance w medication regimen [Z91.14]    Total Time spent with patient: 25 minutes  Past Psychiatric History: See Above  Past Medical History:  Past Medical History:  Diagnosis Date  . Bipolar affective disorder, currently manic, mild ([place])   . Diabetes mellitus without complication ([place])   . Schizophrenia ([place])    History reviewed. No pertinent surgical history. Family History:  Family History  Problem Relation Age of Onset  . Drug abuse Maternal Uncle     Social History:  History  Alcohol Use No     History  Drug use: Unknown    Social History   Social History  . Marital status: Legally Separated    Spouse name: N/A  . Number of children: N/A  . Years of education: N/A   Social History Main Topics  . Smoking status: Never Smoker  . Smokeless tobacco: None  . Alcohol use No  . Drug use: Unknown  . Sexual activity: Not Asked   Other Topics Concern  . None    Social History Narrative  . None   Additional Social History:    Sleep: Fair  Appetite:  Fair  Current Medications: Current Facility-Administered Medications  Medication Dose Route Frequency Provider Last Rate Last Dose  . acetaminophen (TYLENOL) tablet 650 mg  650 mg Oral Q6H PRN Patrecia Pour, NP   650 mg at 08/03/15 2124  . alum & mag hydroxide-simeth (MAALOX/MYLANTA) 200-200-20 MG/5ML suspension 30 mL  30 mL Oral Q4H PRN Patrecia Pour, NP   30 mL at 07/30/15 2114  . benztropine (COGENTIN) tablet 0.5 mg  0.5 mg Oral BID PRN Hampton Abbot, MD      . divalproex (DEPAKOTE ER) 24 hr tablet 1,000 mg  1,000 mg Oral QHS Patrecia Pour, NP   1,000 mg at 08/05/15 2133  . docusate sodium (COLACE) capsule 100 mg  100 mg Oral Daily Kerrie Buffalo, NP   100 mg at 08/06/15 0824  . hydrocerin (EUCERIN) cream   Topical TID Ursula Alert, MD      . hydrOXYzine (ATARAX/VISTARIL) tablet 25 mg  25 mg Oral TID PRN Patrecia Pour, NP   25 mg at 07/22/15 2144  . LORazepam (ATIVAN) tablet 1 mg  1 mg Oral Q6H PRN Ursula Alert, MD       Or  . LORazepam (ATIVAN) injection 1 mg  1 mg Intramuscular Q6H PRN Ursula Alert, MD      . magnesium hydroxide (MILK OF  MAGNESIA) suspension 30 mL  30 mL Oral Daily PRN Patrecia Pour, NP   30 mL at 07/27/15 0912  . metFORMIN (GLUCOPHAGE) tablet 500 mg  500 mg Oral Q breakfast Patrecia Pour, NP   500 mg at 08/06/15 [id number]  . multivitamin with minerals tablet 1 tablet  1 tablet Oral Daily Kerrie Buffalo, NP   1 tablet at 08/06/15 0824  . naproxen (NAPROSYN) tablet 500 mg  500 mg Oral Q12H PRN Kerrie Buffalo, NP   500 mg at 07/29/15 2054  . oxybutynin (DITROPAN-XL) 24 hr tablet 10 mg  10 mg Oral QHS Ursula Alert, MD   10 mg at 08/05/15 2133  . [START ON 09/07/2015] paliperidone (INVEGA SUSTENNA) injection 117 mg  117 mg Intramuscular Q28 days Ursula Alert, MD      . Derrill Memo ON 08/10/2015] paliperidone (INVEGA SUSTENNA) injection 156 mg  156 mg Intramuscular Once Jenna Routzahn, MD      . paliperidone (INVEGA) 24 hr tablet 6 mg  6 mg Oral QHS Cyrilla Durkin, MD      . pantoprazole (PROTONIX) EC tablet 40 mg  40 mg Oral Daily Nanci Pina, FNP   40 mg at 08/06/15 0824  . polyethylene glycol (MIRALAX / GLYCOLAX) packet 17 g  17 g Oral BID Kerrie Buffalo, NP   17 g at 08/06/15 0825  . ziprasidone (GEODON) capsule 20 mg  20 mg Oral BID PRN Ursula Alert, MD       Or  . ziprasidone (GEODON) injection 10 mg  10 mg Intramuscular BID PRN Ursula Alert, MD      . zolpidem (AMBIEN) tablet 5 mg  5 mg Oral QHS Ursula Alert, MD        Lab Results:  Results for orders placed or performed during the hospital encounter of 07/21/15 (from the past 48 hour(s))  Glucose, capillary     Status: None   Collection Time: 08/04/15 12:15 PM  Result Value Ref Range   Glucose-Capillary 92 65 - 99 mg/dL  Glucose, capillary     Status: None   Collection Time: 08/06/15  6:23 AM  Result Value Ref Range   Glucose-Capillary 92 65 - 99 mg/dL    Blood Alcohol level:  Lab Results  Component Value Date   ETH <5 07/21/2015   ETH <5 0000000    Metabolic Disorder Labs: Lab Results  Component Value Date   HGBA1C 5.6 07/25/2015   MPG 114 07/25/2015   MPG 137 02/01/2015   Lab Results  Component Value Date   PROLACTIN 46.5 (H) 07/25/2015   PROLACTIN 21.6 02/01/2015   Lab Results  Component Value Date   CHOL 135 07/25/2015   TRIG 59 07/25/2015   HDL 50 07/25/2015   CHOLHDL 2.7 07/25/2015   VLDL 12 07/25/2015   LDLCALC 73 07/25/2015   LDLCALC 78 02/01/2015    Physical Findings: AIMS: Facial and Oral Movements Muscles of Facial Expression: None, normal Lips and Perioral Area: None, normal Jaw: None, normal Tongue: None, normal,Extremity Movements Upper (arms, wrists, hands, fingers): None, normal Lower (legs, knees, ankles, toes): None, normal, Trunk Movements Neck, shoulders, hips: None, normal, Overall Severity Severity of abnormal movements (highest score  from questions above): None, normal Incapacitation due to abnormal movements: None, normal Patient's awareness of abnormal movements (rate only patient's report): No Awareness, Dental Status Current problems with teeth and/or dentures?: No Does patient usually wear dentures?: No  CIWA:    COWS:     Musculoskeletal: Strength & Muscle  Tone: within normal limits Gait & Station: normal Patient leans: N/A  Psychiatric Specialty Exam: Physical Exam  Nursing note and vitals reviewed. Constitutional: She is oriented to person, place, and time.  Neurological: She is alert and oriented to person, place, and time.  Psychiatric: She has a normal mood and affect. Her behavior is normal. Thought content is paranoid and delusional.    Review of Systems  Constitutional: Negative for fever and malaise/fatigue.  HENT: Negative for congestion and sore throat.   Eyes: Negative for blurred vision, discharge and redness.  Respiratory: Negative for cough, shortness of breath and wheezing.   Cardiovascular: Negative for chest pain and palpitations.  Gastrointestinal: Negative for abdominal pain, constipation, diarrhea, heartburn, nausea and vomiting.  Genitourinary: Negative for dysuria and urgency.  Musculoskeletal: Negative for falls and myalgias.  Skin: Negative for rash.  Neurological: Negative for dizziness, seizures, loss of consciousness, weakness and headaches.  Endo/Heme/Allergies: Negative for environmental allergies.  Psychiatric/Behavioral: Positive for depression. Negative for hallucinations, substance abuse and suicidal ideas. The patient is nervous/anxious and has insomnia.   All other systems reviewed and are negative.   Blood pressure 107/83, pulse 98, temperature 98.2 F (36.8 C), temperature source Oral, resp. rate 20, height 5\' 4"  (1.626 m).There is no height or weight on file to calculate BMI.  General Appearance: Fairly Groomed  Eye Contact:  Fair  Speech:  Pressured  Volume:   Increased  Mood:  Angry, Anxious, Euphoric and Irritable improving  Affect:  Labile  Thought Process:  Coherent  Orientation:  Full (Time, Place, and Person)  Thought Content:  Delusions, Hallucinations: Auditory Visual, Paranoid Ideation and Rumination sees and hears satan and angels,Hyperreligious this morning   Suicidal Thoughts:  No but is very paranoid- potential danger to self or others  Homicidal Thoughts:  No  Memory:  Immediate;   Fair Recent;   Fair Remote;   Fair  Judgement:  Impaired  Insight:  Lacking  Psychomotor Activity:  Normal  Concentration:  Concentration: Poor  Recall:  Poor  Fund of Knowledge:  Good  Language:  Good  Akathisia:  No  Handed:  Right  AIMS (if indicated):     Assets:  Communication Skills Desire for Improvement Resilience  ADL's:  Intact  Cognition:  WNL  Sleep:  Number of Hours: 2     Treatment Plan Summary:Patient continues to have sleep issues - will readjust medications. Daily contact with patient to assess and evaluate symptoms and progress in treatment and Medication management  Will continue to monitor vitals ,medication compliance and treatment side effects. CSW will continue to work on disposition. Pt to be referred to ACTT . Patient to work on her coping skills, medication compliance issues, to participate in therapeutic milieu Continue Depakote ER 1000 mg po Q hs For mood stabilization.Depakote level reviewed - therapeutic . Continue Invega to 6 mg po qhs for psychosis.Plan to DC Patient on an LAI .First dose of [place] IM 234 mg on 08/05/15.Next dose of Invega Sustenna 156 mg IM on 08/10/15. Then repeat Invega sustenna 117 mg IM q28 days. Will discontinue Doxepin for SE. Will start Ambien 5 mg po qhs for sleep. Continue Cogentin 0.5 mg po BID  PRN for EPS . Continue Vistaril 25 mg POI TID prn for anxiety Continue Glucophage 500 mg PO daily for type 2 diabetes Continue Ditropan XL 10 mgPO  daily for overactive bladder  . CSW will continue to work on disposition planning.    Alvy Alsop 08/06/2015 12:05 PM

## 2015-08-06 NOTE — Progress Notes (Signed)
Pt had no problems this evening, pt stated she was ready to go.

## 2015-08-06 NOTE — Clinical Social Work Note (Addendum)
Patient declined at [facility] for ACT Team as she is not [facility].  Will need to be referred to provider who can accept [employer].  Patients Medicaid is from [facility].  Transitional care RN w Catarina Hartshorn will be asked to assist.  Pt can be referred to [facility].    Edwyna Shell, LCSW Lead Clinical Social Worker Phone:  [phone]

## 2015-08-07 MED ORDER — OXYBUTYNIN CHLORIDE ER 10 MG PO TB24: 10.0000 mg | ORAL_TABLET | Freq: Every day | ORAL | 0 refills | Status: DC

## 2015-08-07 MED ORDER — PALIPERIDONE PALMITATE 156 MG/ML IM SUSP: 156.0000 mg | Freq: Once | INTRAMUSCULAR | 0 refills | Status: DC

## 2015-08-07 MED ORDER — PALIPERIDONE ER 6 MG PO TB24: 6.0000 mg | ORAL_TABLET | Freq: Every day | ORAL | 0 refills | Status: DC

## 2015-08-07 MED ORDER — PALIPERIDONE PALMITATE 156 MG/ML IM SUSP: 117.0000 mg | INTRAMUSCULAR | 0 refills | Status: DC

## 2015-08-07 MED ORDER — PANTOPRAZOLE SODIUM 40 MG PO TBEC: 40.0000 mg | DELAYED_RELEASE_TABLET | Freq: Every day | ORAL | 0 refills | Status: DC

## 2015-08-07 NOTE — Clinical Social Work Note (Signed)
[name] [name], injection nurse at [facility], received fax re need for [employer] injection on 08/10/15.  Loletha Grayer states that [employer] will "do all we can to fast track patient and reach out to her" to ensure that patient gets injection as scheduled.  Edwyna Shell, LCSW Lead Clinical Social Worker Phone:  [phone]

## 2015-08-07 NOTE — Progress Notes (Signed)
Recreation Therapy Notes  08/07/15 1025:  Pt stated she was discharging today.  Pt expressed she was ready to go and can't wait to get her hair and nails done on tomorrow.  Pt expressed that she would read the bible or go outside and meditated if she started to feel overwhelmed. Pt also expressed an interest in yoga and "water therapy".  LRT encouraged pt to continue to use the skills/techniques she learned once discharged.  Victorino Sparrow, LRT/CTRS    Ria Comment, Maansi Wike A 08/07/2015 10:58 AM

## 2015-08-07 NOTE — Tx Team (Signed)
Interdisciplinary Treatment Plan Update (Adult) Date: 08/07/2015   Date: 08/07/2015 9:57 AM  Progress in Treatment:  Attending groups: No Participating in groups: No Taking medication as prescribed: Yes  Tolerating medication: Yes  Family/Significant other contact made: Yes Patient understands diagnosis: Limited insight Discussing patient identified problems/goals with staff: Yes  Medical problems stabilized or resolved: Yes  Denies suicidal/homicidal ideation: Yes Patient has not harmed self or Others: Yes   New problem(s) identified: None identified at this time.   Discharge Plan or Barriers: Pt will return home and follow-up with outpatient services.   Additional comments: 07/27/15: Mood Stabilization: Depakote ER 1000 mg QHS Continue Geodon 80 mg Bid for mood stabilization and psychosis. Discussed in length with patient the need for medication compliance in order for her to be psychiatrically stable outpatient. Also discussed her current stresses which include the place she lives at, feeling overwhelmed because there are a lot of people around and the need for her to work on her coping skills, desensitization while here Decrease trazodone to 50 mg 1 at bedtime as needed for sleep Change Cogentin 0.5 mg po BID PRN as patient has not had any EPS symptoms  08/01/15:  " I am not going to trust any one , every body is against me, the Reita Cliche tells me so." Depakote ER 1000 mg po Q hs For mood stabilization.Depakote level reviewed - therapeutic . Will continue to cross titrate Geodon with Abilify - since patient has ADRs on higher dose of Geodon and she currently continues to be psychotic Plan   to DC Patient on an LAI . Increase Trazodone to 100 mg PO at bedtime as needed for sleep Continue Cogentin 0.5 mg po BID  PRN for EPS . Continue Vistaril 25 mg POI TID prn for anxiety Continue Glucophage 500 mg PO daily for type 2 diabetes Continue Ditropan XL 5 mgPO  daily for overactive bladder  . Will get UA - for concerns about UTI.   Reason for Continuation of Hospitalization:  Medication stabilization Psychosis Paranoia  Estimated length of stay: D/C today  Review of initial/current patient goals per problem list:   1.  Goal(s): Patient will participate in aftercare plan  Met:  Yes  Target date: 3-5 days from date of admission   As evidenced by: Patient will participate within aftercare plan AEB aftercare provider and housing plan at discharge being identified.   07/23/15: Pt will return home and follow-up with outpatient services.  5.  Goal(s): Patient will demonstrate decreased signs of psychosis  . Met:  No . Target date: 3-5 days from date of admission  . As evidenced by: Patient will demonstrate decreased frequency of AVH or return to baseline function   07/23/15: Pt continues to exhibit signs of AVH and is guarded with staff, reports feeling that people are    "out to get her."   07/27/15:  Continues to isolate in her room, endorses paranoia 08/01/15:  Continues to avoid groups, is religiously preoccupied 08/07/15:  No signs nor symptoms of psychosis today.  Willing to get Invega injection  Attendees:  Patient:    Family:    Physician: Dr. Shea Evans, MD  08/07/2015 9:57 AM  Nursing: Hedy Jacob, RN  08/07/2015 9:57 AM  Clinical Social Worker Rod Arroyo   08/07/2015 9:57 AM  Other:  08/07/2015 9:57 AM  Clinical: Lars Pinks, RN Case manager  08/07/2015 9:57 AM  Other:  08/07/2015 9:57 AM  Other:     Ripley Fraise

## 2015-08-07 NOTE — Discharge Summary (Signed)
Physician Discharge Summary Note  Patient:  [name] [name] is an 35 y.o., female MRN:  [id number] DOB:  23-Feb-1980 Patient phone:  [phone] (home)  Patient address:   [address] [place] [address],  Total Time spent with patient: 30 minutes  Date of Admission:  07/21/2015 Date of Discharge: 08/07/2015  Reason for Admission:  Paranoia  Principal Problem: Schizoaffective disorder, bipolar type [facility]) Discharge Diagnoses: Patient Active Problem List   Diagnosis Date Noted  . Insomnia [G47.00]   . Anxiety state [F41.1]   . Overactive bladder [N32.81]   . Diabetes mellitus ([place]) [E11.9] 02/08/2015  . Schizoaffective disorder, bipolar type ([place]) [F25.0] 01/28/2015  . Non compliance w medication regimen [Z91.14]     Past Psychiatric History: see HPI  Past Medical History:  Past Medical History:  Diagnosis Date  . Bipolar affective disorder, currently manic, mild ([place])   . Diabetes mellitus without complication ([place])   . Schizophrenia ([place])    History reviewed. No pertinent surgical history. Family History:  Family History  Problem Relation Age of Onset  . Drug abuse Maternal Uncle    Family Psychiatric  History: see HPI Social History:  History  Alcohol Use No     History  Drug use: Unknown    Social History   Social History  . Marital status: Legally Separated    Spouse name: N/A  . Number of children: N/A  . Years of education: N/A   Social History Main Topics  . Smoking status: Never Smoker  . Smokeless tobacco: None  . Alcohol use No  . Drug use: Unknown  . Sexual activity: Not Asked   Other Topics Concern  . None   Social History Narrative  . None    Hospital Course:  [name] [name], 35 yo came to [facility] after she was seen in the ED.  She reported that she called the cops to bring her to the hospital as voices told her so.  Patient also reported that there were people that were trying to harm her.    [name] [name] was  admitted for Schizoaffective disorder, bipolar type ([place]) and crisis management.  She was treated with Geodon initially but her paranoia, agitated behaviors were still evident.  She had intermittent outbursts of anger and agitated the milieu.  She was prescribed and will taper off Invega oral route.  She will be prescribed Invega IM on 08/10/15 and 09/07/15.  Medical problems were identified and treated as needed.  Home medications were restarted as appropriate.  Improvement was monitored by observation and [name] daily report of symptom reduction.  Emotional and mental status was monitored by daily self inventory reports completed by [name] and clinical staff.  Patient reported continued improvement, denied any new concerns.  Patient had been compliant on medications and denied side effects.  Support and encouragement was provided.    Patient encouraged to attend groups to help with recognizing triggers of emotional crises and de-stabilizations.  Patient encouraged to attend group to help identify the positive things in life that would help in dealing with feelings of loss, depression and unhealthy or abusive tendencies.         [name] [name] was evaluated by the treatment team for stability and plans for continued recovery upon discharge.  She was offered further treatment options upon discharge including Residential, Intensive Outpatient and Outpatient treatment.  She will follow up with agency listed below for medication management and counseling.  Encouraged patient to maintain satisfactory support network and  home environment.  Advised to adhere to medication compliance and outpatient treatment follow up.  Prescriptions provided.       [name] [name] motivation was an integral factor for scheduling further treatment.  Employment, transportation, bed availability, health status, family support, and any pending legal issues were also considered during her  hospital stay.  Upon completion of this admission the patient was both mentally and medically stable for discharge denying suicidal/homicidal ideation, auditory/visual/tactile hallucinations, delusional thoughts and paranoia.      Physical Findings: AIMS: Facial and Oral Movements Muscles of Facial Expression: None, normal Lips and Perioral Area: None, normal Jaw: None, normal Tongue: None, normal,Extremity Movements Upper (arms, wrists, hands, fingers): None, normal Lower (legs, knees, ankles, toes): None, normal, Trunk Movements Neck, shoulders, hips: None, normal, Overall Severity Severity of abnormal movements (highest score from questions above): None, normal Incapacitation due to abnormal movements: None, normal Patient's awareness of abnormal movements (rate only patient's report): No Awareness, Dental Status Current problems with teeth and/or dentures?: No Does patient usually wear dentures?: No  CIWA:    COWS:     Musculoskeletal: Strength & Muscle Tone: within normal limits Gait & Station: normal Patient leans: N/A  Psychiatric Specialty Exam:  SEE MD SRA Physical Exam  ROS  Blood pressure 107/83, pulse 98, temperature 98.2 F (36.8 C), temperature source Oral, resp. rate 20, height 5\' 4"  (1.626 m).There is no height or weight on file to calculate BMI.    Have you used any form of tobacco in the last 30 days? (Cigarettes, Smokeless Tobacco, Cigars, and/or Pipes): Patient Refused Screening  Has this patient used any form of tobacco in the last 30 days? (Cigarettes, Smokeless Tobacco, Cigars, and/or Pipes) Yes, NA  Blood Alcohol level:  Lab Results  Component Value Date   [facility] <5 07/21/2015   ETH <5 0000000    Metabolic Disorder Labs:  Lab Results  Component Value Date   HGBA1C 5.6 07/25/2015   MPG 114 07/25/2015   MPG 137 02/01/2015   Lab Results  Component Value Date   PROLACTIN 46.5 (H) 07/25/2015   PROLACTIN 21.6 02/01/2015   Lab Results  Component  Value Date   CHOL 135 07/25/2015   TRIG 59 07/25/2015   HDL 50 07/25/2015   CHOLHDL 2.7 07/25/2015   VLDL 12 07/25/2015   LDLCALC 73 07/25/2015   LDLCALC 78 02/01/2015    See Psychiatric Specialty Exam and Suicide Risk Assessment completed by Attending Physician prior to discharge.  Discharge destination:  Home  Is patient on multiple antipsychotic therapies at discharge:  No   Has Patient had three or more failed trials of antipsychotic monotherapy by history:  Yes.  Patient had been on Abilify and Geodon  Recommended Plan for Multiple Antipsychotic Therapies: Taper to monotherapy as described:  per outpatient provider     Medication List    STOP taking these medications   ARIPiprazole 5 MG tablet Commonly known as:  ABILIFY   benztropine 0.5 MG tablet Commonly known as:  COGENTIN   docusate sodium 100 MG capsule Commonly known as:  COLACE   fesoterodine 4 MG Tb24 tablet Commonly known as:  TOVIAZ   tolterodine 2 MG tablet Commonly known as:  DETROL   traZODone 100 MG tablet Commonly known as:  DESYREL   ziprasidone 80 MG capsule Commonly known as:  GEODON   zolpidem 5 MG tablet Commonly known as:  AMBIEN     TAKE these medications     Indication  divalproex 500 MG  24 hr tablet Commonly known as:  DEPAKOTE ER Take 2 tablets (1,000 mg total) by mouth at bedtime.  Indication:  mood stabilization   hydrOXYzine 25 MG tablet Commonly known as:  ATARAX/VISTARIL Take 1 tablet (25 mg total) by mouth 3 (three) times daily as needed for anxiety. What changed:  how much to take  how to take this  when to take this  reasons to take this  additional instructions  Indication:  Anxiety Neurosis   metFORMIN 500 MG tablet Commonly known as:  GLUCOPHAGE Take 1 tablet (500 mg total) by mouth daily with breakfast. What changed:  additional instructions  Indication:  Type 2 Diabetes   oxybutynin 10 MG 24 hr tablet Commonly known as:  DITROPAN-XL Take 1  tablet (10 mg total) by mouth at bedtime.  Indication:  Difficult or Painful Urination, Frequent Urination   paliperidone 156 MG/ML Susp injection Commonly known as:  INVEGA SUSTENNA Inject 1 mL (156 mg total) into the muscle once. Start taking on:  08/10/2015  Indication:  mood stabilization   paliperidone 156 MG/ML Susp injection Commonly known as:  INVEGA SUSTENNA Inject 0.75 mLs (117 mg total) into the muscle every 28 (twenty-eight) days. Next dose is due 09/07/2015 Start taking on:  09/07/2015  Indication:  mood stabilization   paliperidone 6 MG 24 hr tablet Commonly known as:  INVEGA Take 1 tablet (6 mg total) by mouth at bedtime. What changed:  when to take this  Indication:  mood stabilization   pantoprazole 40 MG tablet Commonly known as:  PROTONIX Take 1 tablet (40 mg total) by mouth daily.  Indication:  Gastroesophageal Reflux Disease      Follow-up Information    [facility]. Go on 08/10/2015.   Specialty:  Behavioral Health Why:  Go to the walk-in clinic before Friday so you can get your injection on Friday.  Hours are M - F from 8 - 11. Arrive early to insure you will be seen.  Bring hospital discharge paperwork. Contact information: [place] [address] [phone]        [facility] .   Why:  Call them this week to find out when they will be able to pick you up for services. Contact information: [place]  [address] Phone:  [phone] Fax: [phone]          Follow-up recommendations:  Activity:  as tol Diet:  as tol  Comments:  1.  Take all your medications as prescribed.   2.  Report any adverse side effects to outpatient provider. 3.  Patient instructed to not use alcohol or illegal drugs while on prescription medicines. 4.  In the event of worsening symptoms, instructed patient to call 911, the crisis hotline or go to nearest emergency room for evaluation of symptoms.  Signed: Janett Labella, NP [facility] 08/07/2015, 2:41 PM

## 2015-08-07 NOTE — Progress Notes (Signed)
  [facility] Adult Case Management Discharge Plan :  Will you be returning to the same living situation after discharge:  Yes,  home At discharge, do you have transportation home?: Yes,  friend Do you have the ability to pay for your medications: Yes,  MCR/MCD  Release of information consent forms completed and in the chart;  Patient's signature needed at discharge.  Patient to Follow up at: Follow-up Information    [facility]. Go on 08/10/2015.   Specialty:  Behavioral Health Why:  Go to the walk-in clinic before Friday so you can get your injection on Friday.  Hours are M - F from 8 - 11. Arrive early to insure you will be seen.  Bring hospital discharge paperwork. Contact information: [place] [address] [phone]        [facility] .   Why:  Call them this week to find out when they will be able to pick you up for services. Contact information: [place]  [address] Phone:  [phone] Fax: [phone]          Next level of care provider has access to [place] and Suicide Prevention discussed: Yes  Have you used any form of tobacco in the last 30 days? (Cigarettes, Smokeless Tobacco, Cigars, and/or Pipes): Patient Refused Screening  Has patient been referred to the Quitline?: N/A patient is not a smoker  Patient has been referred for addiction treatment: N/A  Roque Lias B 08/07/2015, 10:00 AM

## 2015-08-07 NOTE — BHH Suicide Risk Assessment (Signed)
[name] [name] INPATIENT:  Family/Significant Other Suicide Prevention Education  Suicide Prevention Education:  Education Completed; No one has been identified by the patient as the family member/significant other with whom the patient will be residing, and identified as the person(s) who will aid the patient in the event of a mental health crisis (suicidal ideations/suicide attempt).  With written consent from the patient, the family member/significant other has been provided the following suicide prevention education, prior to the and/or following the discharge of the patient.  The suicide prevention education provided includes the following:  Suicide risk factors  Suicide prevention and interventions  National Suicide Hotline telephone number  [facility] assessment telephone number  [facility] Emergency Assistance [place] and/or Residential Mobile Crisis Unit telephone number  Request made of family/significant other to:  Remove weapons (e.g., guns, rifles, knives), all items previously/currently identified as safety concern.    Remove drugs/medications (over-the-counter, prescriptions, illicit drugs), all items previously/currently identified as a safety concern.  The family member/significant other verbalizes understanding of the suicide prevention education information provided.  The family member/significant other agrees to remove the items of safety concern listed above. The patient did not endorse SI at the time of admission, nor did the patient c/o SI during the stay here.  SPE not required. However, I did talk to friend [name] [name] [phone] about treatment team recommendations and crises plan.  Roque Lias B 08/07/2015, 9:52 AM

## 2015-08-07 NOTE — BHH Suicide Risk Assessment (Signed)
[facility] Discharge Suicide Risk Assessment   Principal Problem: Schizoaffective disorder, bipolar type [facility]) Discharge Diagnoses:  Patient Active Problem List   Diagnosis Date Noted  . Insomnia [G47.00]   . Anxiety state [F41.1]   . Overactive bladder [N32.81]   . Diabetes mellitus ([place]) [E11.9] 02/08/2015  . Schizoaffective disorder, bipolar type ([place]) [F25.0] 01/28/2015  . Non compliance w medication regimen [Z91.14]     Total Time spent with patient: 30 minutes  Musculoskeletal: Strength & Muscle Tone: within normal limits Gait & Station: normal Patient leans: N/A  Psychiatric Specialty Exam: Review of Systems  Psychiatric/Behavioral: Negative for depression and suicidal ideas. The patient is not nervous/anxious.   All other systems reviewed and are negative.   Blood pressure 107/83, pulse 98, temperature 98.2 F (36.8 C), temperature source Oral, resp. rate 20, height 5\' 4"  (1.626 m).There is no height or weight on file to calculate BMI.  General Appearance: Casual  Eye Contact::  Fair  Speech:  Clear and Coherent409  Volume:  Normal  Mood:  Euthymic  Affect:  Appropriate  Thought Process:  Goal Directed and Descriptions of Associations: Intact  Orientation:  Full (Time, Place, and Person)  Thought Content:  Logical  Suicidal Thoughts:  No  Homicidal Thoughts:  No  Memory:  Immediate;   Fair Recent;   Fair Remote;   Fair  Judgement:  Fair  Insight:  Fair  Psychomotor Activity:  Normal  Concentration:  Fair  Recall:  [employer] of Knowledge:Fair  Language: Fair  Akathisia:  No  Handed:  Right  AIMS (if indicated):   0  Assets:  Desire for Improvement  Sleep:  Number of Hours: 2  Cognition: WNL  ADL's:  Intact   Mental Status Per Nursing Assessment::   On Admission:  NA  Demographic Factors:  NA  Loss Factors: NA  Historical Factors: Impulsivity  Risk Reduction Factors:   Positive social support  Continued Clinical Symptoms:  Previous Psychiatric  Diagnoses and Treatments  Cognitive Features That Contribute To Risk:  None    Suicide Risk:  Minimal: No identifiable suicidal ideation.  Patients presenting with no risk factors but with morbid ruminations; may be classified as minimal risk based on the severity of the depressive symptoms  Follow-up Information    [facility]. Go on 08/10/2015.   Specialty:  Behavioral Health Why:  Please use [facility] to establish for services.  Hours are Monday - Friday from 8 - 3. Arrive early for prompt service. Bring hospital discharge paperwork. Go the day after discharge, you will need Invega Sustenna injection on 08/10/15. Contact information: [place] [address] [phone]        [facility] .   Why:  Pt referred to this provider for ACT services Contact information: [place]  [address] Phone:  [phone] Fax: [phone]          Plan Of Care/Follow-up recommendations:  Activity:  no restrictions Diet:  regular Tests:  Invega sustenna IM 234 mg given on 08/05/15- next dose of 156 mg on 08/10/15 and then repeat 117 mg IM  q28 days Other:  NONE  Ndea Kilroy, MD 08/07/2015, 9:32 AM

## 2015-08-07 NOTE — Progress Notes (Signed)
Patient discharged to lobby. Patient was stable and appreciative at that time. All papers, samples and prescriptions were given and valuables returned. Verbal understanding expressed. Denies SI/HI and A/VH. Patient given opportunity to express concerns and ask questions.  

## 2015-11-30 ENCOUNTER — Encounter (HOSPITAL_COMMUNITY): Payer: Self-pay | Admitting: Emergency Medicine

## 2015-11-30 ENCOUNTER — Emergency Department (HOSPITAL_COMMUNITY)
Admission: EM | Admit: 2015-11-30 | Discharge: 2015-11-30 | Disposition: A | Discharge status: Home / Self Care | Payer: Medicare Other | Attending: Emergency Medicine | Admitting: Emergency Medicine

## 2015-11-30 DIAGNOSIS — R443 Hallucinations, unspecified: Secondary | ICD-10-CM | POA: Diagnosis present

## 2015-11-30 DIAGNOSIS — E119 Type 2 diabetes mellitus without complications: Secondary | ICD-10-CM | POA: Insufficient documentation

## 2015-11-30 DIAGNOSIS — F250 Schizoaffective disorder, bipolar type: Secondary | ICD-10-CM | POA: Insufficient documentation

## 2015-11-30 DIAGNOSIS — Z7984 Long term (current) use of oral hypoglycemic drugs: Secondary | ICD-10-CM | POA: Insufficient documentation

## 2015-11-30 LAB — URINALYSIS, ROUTINE W REFLEX MICROSCOPIC
Glucose, UA: NEGATIVE mg/dL
HGB URINE DIPSTICK: NEGATIVE
KETONES UR: NEGATIVE mg/dL
Leukocytes, UA: NEGATIVE
NITRITE: NEGATIVE
PROTEIN: NEGATIVE mg/dL
Specific Gravity, Urine: 1.031 — ABNORMAL HIGH (ref 1.005–1.030)
pH: 5.5 (ref 5.0–8.0)

## 2015-11-30 LAB — COMPREHENSIVE METABOLIC PANEL
ALBUMIN: 4.8 g/dL (ref 3.5–5.0)
ALK PHOS: 36 U/L — AB (ref 38–126)
ALT: 14 U/L (ref 14–54)
ANION GAP: 10 (ref 5–15)
AST: 19 U/L (ref 15–41)
BILIRUBIN TOTAL: 0.9 mg/dL (ref 0.3–1.2)
BUN: 17 mg/dL (ref 6–20)
CALCIUM: 9.9 mg/dL (ref 8.9–10.3)
CO2: 25 mmol/L (ref 22–32)
CREATININE: 0.92 mg/dL (ref 0.44–1.00)
Chloride: 105 mmol/L (ref 101–111)
GFR calc non Af Amer: >60 mL/min (ref >60)
GLUCOSE: 102 mg/dL — AB (ref 65–99)
Potassium: 3.6 mmol/L (ref 3.5–5.1)
Sodium: 140 mmol/L (ref 135–145)
TOTAL PROTEIN: 8.7 g/dL — AB (ref 6.5–8.1)

## 2015-11-30 LAB — ETHANOL: Alcohol, Ethyl (B): <5 mg/dL (ref <5)

## 2015-11-30 LAB — RAPID URINE DRUG SCREEN, HOSP PERFORMED
Amphetamines: NOT DETECTED
BARBITURATES: NOT DETECTED
Benzodiazepines: NOT DETECTED
COCAINE: NOT DETECTED
Opiates: NOT DETECTED
TETRAHYDROCANNABINOL: NOT DETECTED

## 2015-11-30 LAB — ACETAMINOPHEN LEVEL

## 2015-11-30 LAB — CBC
HEMATOCRIT: 38.6 % (ref 36.0–46.0)
HEMOGLOBIN: 13.1 g/dL (ref 12.0–15.0)
MCH: 31.7 pg (ref 26.0–34.0)
MCHC: 33.9 g/dL (ref 30.0–36.0)
MCV: 93.5 fL (ref 78.0–100.0)
Platelets: 222 10*3/uL (ref 150–400)
RBC: 4.13 MIL/uL (ref 3.87–5.11)
RDW: 13 % (ref 11.5–15.5)
WBC: 4.2 10*3/uL (ref 4.0–10.5)

## 2015-11-30 LAB — SALICYLATE LEVEL: Salicylate Lvl: <7 mg/dL (ref 2.8–30.0)

## 2015-11-30 LAB — I-STAT BETA HCG BLOOD, ED (MC, WL, AP ONLY)

## 2015-11-30 LAB — VALPROIC ACID LEVEL

## 2015-11-30 NOTE — Discharge Instructions (Signed)
An HIV test has been drawn. You will be called if the result was abnormal. You can go to your local health department to get a primary care physician. Go to any of the resources provided if you want or need psychiatric help. If you feel that you may harm herself or someone else call 911 immediately. Return if your condition worsens for any reason

## 2015-11-30 NOTE — ED Notes (Signed)
DELAY IN BLOOD DRAW DUE TO TTS IN WITH PT

## 2015-11-30 NOTE — ED Notes (Signed)
Pt has medical and psychiatric complaint. Per Vincente Liberty pt to be evaluated in [place]. Jacubowitz MD aware of pt status and complaint.

## 2015-11-30 NOTE — ED Provider Notes (Signed)
[place] DEPT Provider Note   CSN: [id number] Arrival date & time: 11/30/15  1045     History   Chief Complaint Chief Complaint  Patient presents with  . Hallucinations  . Multiple Complaints   Level V caveat, patient uncooperative with questioning HPI [name] [name] is a 35 y.o. female.Patient is requesting HIV test. She denies any specific complaint. She states "I want tests" she denies seeing or hearing things. Denies wanting to harm herself or anyone else. No treatment prior to coming here. Stating she does not have a physician.  HPI  Past Medical History:  Diagnosis Date  . Bipolar affective disorder, currently manic, mild ([place])   . Diabetes mellitus without complication ([place])   . Schizophrenia [facility])     Patient Active Problem List   Diagnosis Date Noted  . Insomnia   . Anxiety state   . Overactive bladder   . Diabetes mellitus ([place]) 02/08/2015  . Schizoaffective disorder, bipolar type ([place]) 01/28/2015  . Non compliance w medication regimen     History reviewed. No pertinent surgical history.  OB History    No data available       Home Medications    Prior to Admission medications   Medication Sig Start Date End Date Taking? Authorizing Provider  divalproex (DEPAKOTE ER) 500 MG 24 hr tablet Take 2 tablets (1,000 mg total) by mouth at bedtime. 07/27/15   Kerrie Buffalo, NP  hydrOXYzine (ATARAX/VISTARIL) 25 MG tablet Take 1 tablet (25 mg total) by mouth 3 (three) times daily as needed for anxiety. 07/27/15   Kerrie Buffalo, NP  metFORMIN (GLUCOPHAGE) 500 MG tablet Take 1 tablet (500 mg total) by mouth daily with breakfast. 07/27/15   Kerrie Buffalo, NP  oxybutynin (DITROPAN-XL) 10 MG 24 hr tablet Take 1 tablet (10 mg total) by mouth at bedtime. 08/07/15   Kerrie Buffalo, NP  paliperidone (INVEGA SUSTENNA) 156 MG/ML SUSP injection Inject 1 mL (156 mg total) into the muscle once. 08/10/15 08/10/15  Kerrie Buffalo, NP  paliperidone (INVEGA SUSTENNA) 156  MG/ML SUSP injection Inject 0.75 mLs (117 mg total) into the muscle every 28 (twenty-eight) days. Next dose is due 09/07/2015 09/07/15   Kerrie Buffalo, NP  paliperidone (INVEGA) 6 MG 24 hr tablet Take 1 tablet (6 mg total) by mouth at bedtime. 08/07/15 08/10/15  Kerrie Buffalo, NP  pantoprazole (PROTONIX) 40 MG tablet Take 1 tablet (40 mg total) by mouth daily. 08/07/15   Kerrie Buffalo, NP    Family History Family History  Problem Relation Age of Onset  . Drug abuse Maternal Uncle     Social History Social History  Substance Use Topics  . Smoking status: Never Smoker  . Smokeless tobacco: Not on file  . Alcohol use No   Denies illicit drug use  Allergies   Patient has no known allergies.   Review of Systems Review of Systems  Unable to perform ROS: Other   Uncooperative with questioning  Physical Exam Updated Vital Signs BP 128/90 (BP Location: Right Arm)   Pulse 96   Temp 98.2 F (36.8 C) (Oral)   Resp 16   LMP  (LMP Unknown)   SpO2 100%   Physical Exam  Constitutional: She is oriented to person, place, and time. She appears well-developed and well-nourished. No distress.  HENT:  Head: Normocephalic and atraumatic.  Right Ear: External ear normal.  Left Ear: External ear normal.  Eyes: EOM are normal.  Neck: Neck supple.  Cardiovascular: Normal rate.   Pulmonary/Chest: Effort normal.  Musculoskeletal: Normal range of motion.  Neurological: She is oriented to person, place, and time.  Psychiatric:  Angry  Nursing note and vitals reviewed.    ED Treatments / Results  Labs (all labs ordered are listed, but only abnormal results are displayed) Labs Reviewed  COMPREHENSIVE METABOLIC PANEL - Abnormal; Notable for the following:       Result Value   Glucose, Bld 102 (*)    Total Protein 8.7 (*)    Alkaline Phosphatase 36 (*)    All other components within normal limits  ACETAMINOPHEN LEVEL - Abnormal; Notable for the following:    Acetaminophen (Tylenol), Serum  <10 (*)    All other components within normal limits  URINALYSIS, ROUTINE W REFLEX MICROSCOPIC (NOT AT [facility]) - Abnormal; Notable for the following:    Color, Urine AMBER (*)    APPearance CLOUDY (*)    Specific Gravity, Urine 1.031 (*)    Bilirubin Urine SMALL (*)    All other components within normal limits  VALPROIC ACID LEVEL - Abnormal; Notable for the following:    Valproic Acid Lvl <10 (*)    All other components within normal limits  ETHANOL  SALICYLATE LEVEL  CBC  RAPID URINE DRUG SCREEN, HOSP PERFORMED  HIV ANTIBODY (ROUTINE TESTING)  I-STAT BETA HCG BLOOD, ED ([facility], [facility], [facility] ONLY)    EKG  EKG Interpretation None       Radiology No results found.  Procedures Procedures (including critical care time)  Medications Ordered in ED Medications - No data to display  Results for orders placed or performed during the hospital encounter of 11/30/15  Comprehensive metabolic panel  Result Value Ref Range   Sodium 140 135 - 145 mmol/L   Potassium 3.6 3.5 - 5.1 mmol/L   Chloride 105 101 - 111 mmol/L   CO2 25 22 - 32 mmol/L   Glucose, Bld 102 (H) 65 - 99 mg/dL   BUN 17 6 - 20 mg/dL   Creatinine, Ser 0.92 0.44 - 1.00 mg/dL   Calcium 9.9 8.9 - 10.3 mg/dL   Total Protein 8.7 (H) 6.5 - 8.1 g/dL   Albumin 4.8 3.5 - 5.0 g/dL   AST 19 15 - 41 U/L   ALT 14 14 - 54 U/L   Alkaline Phosphatase 36 (L) 38 - 126 U/L   Total Bilirubin 0.9 0.3 - 1.2 mg/dL   GFR calc non Af Amer >60 >60 mL/min   GFR calc Af Amer >60 >60 mL/min   Anion gap 10 5 - 15  Ethanol  Result Value Ref Range   Alcohol, Ethyl (B) <5 <5 mg/dL  Salicylate level  Result Value Ref Range   Salicylate Lvl [id number] 2.8 - 30.0 mg/dL  Acetaminophen level  Result Value Ref Range   Acetaminophen (Tylenol), Serum <10 (L) 10 - 30 ug/mL  cbc  Result Value Ref Range   WBC 4.2 4.0 - 10.5 K/uL   RBC 4.13 3.87 - 5.11 MIL/uL   Hemoglobin 13.1 12.0 - 15.0 g/dL   HCT 38.6 36.0 - 46.0 %   MCV 93.5 78.0 - 100.0 fL   MCH 31.7  26.0 - 34.0 pg   MCHC 33.9 30.0 - 36.0 g/dL   RDW 13.0 11.5 - 15.5 %   Platelets 222 150 - 400 K/uL  Rapid urine drug screen (hospital performed)  Result Value Ref Range   Opiates NONE DETECTED NONE DETECTED   Cocaine NONE DETECTED NONE DETECTED   Benzodiazepines NONE DETECTED NONE DETECTED  Amphetamines NONE DETECTED NONE DETECTED   Tetrahydrocannabinol NONE DETECTED NONE DETECTED   Barbiturates NONE DETECTED NONE DETECTED  Urinalysis, Routine w reflex microscopic (not at [facility])  Result Value Ref Range   Color, Urine AMBER (A) YELLOW   APPearance CLOUDY (A) CLEAR   Specific Gravity, Urine 1.031 (H) 1.005 - 1.030   pH 5.5 5.0 - 8.0   Glucose, UA NEGATIVE NEGATIVE mg/dL   Hgb urine dipstick NEGATIVE NEGATIVE   Bilirubin Urine SMALL (A) NEGATIVE   Ketones, ur NEGATIVE NEGATIVE mg/dL   Protein, ur NEGATIVE NEGATIVE mg/dL   Nitrite NEGATIVE NEGATIVE   Leukocytes, UA NEGATIVE NEGATIVE  Valproic acid level  Result Value Ref Range   Valproic Acid Lvl <10 (L) 50.0 - 100.0 ug/mL  I-Stat beta hCG blood, ED  Result Value Ref Range   I-stat hCG, quantitative <5.0 <5 mIU/mL   Comment 3           No results found. Initial Impression / Assessment and Plan / ED Course  I have reviewed the triage vital signs and the nursing notes.  Pertinent labs & imaging results that were available during my care of the patient were reviewed by me and considered in my medical decision making (see chart for details).  Clinical Course     She was furnished with resources to get psychiatric help. HIV test pending. She refuses psychiatric evaluation and says that she is not point to harm herself or others. She is not actively hallucinating She'll be referred to local health department Final Clinical Impressions(s) / ED Diagnoses  Dx Schizoaffective disorder bipolar type Final diagnoses:  None    New Prescriptions New Prescriptions   No medications on file     Orlie Dakin, MD 11/30/15 1330

## 2015-11-30 NOTE — ED Triage Notes (Signed)
Pt here with pastor requesting evaluation for worsening chronic back pain, urinary frequency, possible dehydration, and check to see if carrier for AIDS. Pastor verbalizes pt off psychiatric medications and has hallucinations. Pt denies SI/HI or A/VH. Pt bizarre behavior with triage, flat affect, refuses to rate pain.

## 2015-11-30 NOTE — ED Notes (Signed)
Unable to collect labs patient is not in the room 

## 2015-11-30 NOTE — ED Notes (Signed)
PT NOT IN ROOM WHEN I WENT TO OBTAIN BLOOD

## 2015-11-30 NOTE — ED Notes (Signed)
Bed: WA27 Expected date:  Expected time:  Means of arrival:  Comments: 

## 2015-12-01 LAB — HIV ANTIBODY (ROUTINE TESTING W REFLEX): HIV Screen 4th Generation wRfx: NONREACTIVE

## 2015-12-20 ENCOUNTER — Emergency Department (HOSPITAL_COMMUNITY)
Admission: EM | Admit: 2015-12-20 | Discharge: 2015-12-21 | Disposition: A | Discharge status: Other Acute Inpatient Hospital | Payer: Medicare Other | Attending: Emergency Medicine | Admitting: Emergency Medicine

## 2015-12-20 ENCOUNTER — Encounter (HOSPITAL_COMMUNITY): Payer: Self-pay | Admitting: Emergency Medicine

## 2015-12-20 DIAGNOSIS — F250 Schizoaffective disorder, bipolar type: Secondary | ICD-10-CM | POA: Diagnosis present

## 2015-12-20 DIAGNOSIS — Z7984 Long term (current) use of oral hypoglycemic drugs: Secondary | ICD-10-CM | POA: Diagnosis not present

## 2015-12-20 DIAGNOSIS — E119 Type 2 diabetes mellitus without complications: Secondary | ICD-10-CM | POA: Diagnosis not present

## 2015-12-20 DIAGNOSIS — F29 Unspecified psychosis not due to a substance or known physiological condition: Secondary | ICD-10-CM | POA: Diagnosis not present

## 2015-12-20 DIAGNOSIS — Z813 Family history of other psychoactive substance abuse and dependence: Secondary | ICD-10-CM | POA: Diagnosis not present

## 2015-12-20 DIAGNOSIS — F411 Generalized anxiety disorder: Secondary | ICD-10-CM | POA: Diagnosis not present

## 2015-12-20 DIAGNOSIS — Z046 Encounter for general psychiatric examination, requested by authority: Secondary | ICD-10-CM | POA: Diagnosis present

## 2015-12-20 DIAGNOSIS — G4700 Insomnia, unspecified: Secondary | ICD-10-CM | POA: Diagnosis not present

## 2015-12-20 LAB — CBC WITH DIFFERENTIAL/PLATELET
Basophils Absolute: 0 10*3/uL (ref 0.0–0.1)
Basophils Relative: 0 %
EOS ABS: 0 10*3/uL (ref 0.0–0.7)
EOS PCT: 0 %
HCT: 35.2 % — ABNORMAL LOW (ref 36.0–46.0)
HEMOGLOBIN: 12.5 g/dL (ref 12.0–15.0)
LYMPHS ABS: 2.4 10*3/uL (ref 0.7–4.0)
Lymphocytes Relative: 38 %
MCH: 31.7 pg (ref 26.0–34.0)
MCHC: 35.5 g/dL (ref 30.0–36.0)
MCV: 89.3 fL (ref 78.0–100.0)
MONOS PCT: 9 %
Monocytes Absolute: 0.6 10*3/uL (ref 0.1–1.0)
NEUTROS PCT: 53 %
Neutro Abs: 3.4 10*3/uL (ref 1.7–7.7)
Platelets: 217 10*3/uL (ref 150–400)
RBC: 3.94 MIL/uL (ref 3.87–5.11)
RDW: 12.5 % (ref 11.5–15.5)
WBC: 6.4 10*3/uL (ref 4.0–10.5)

## 2015-12-20 LAB — BASIC METABOLIC PANEL
Anion gap: 11 (ref 5–15)
BUN: 11 mg/dL (ref 6–20)
CHLORIDE: 105 mmol/L (ref 101–111)
CO2: 23 mmol/L (ref 22–32)
Calcium: 9.9 mg/dL (ref 8.9–10.3)
Creatinine, Ser: 0.94 mg/dL (ref 0.44–1.00)
GFR calc Af Amer: >60 mL/min (ref >60)
GFR calc non Af Amer: >60 mL/min (ref >60)
Glucose, Bld: 118 mg/dL — ABNORMAL HIGH (ref 65–99)
Potassium: 3 mmol/L — ABNORMAL LOW (ref 3.5–5.1)
Sodium: 139 mmol/L (ref 135–145)

## 2015-12-20 LAB — URINALYSIS, ROUTINE W REFLEX MICROSCOPIC
BACTERIA UA: NONE SEEN
Bilirubin Urine: NEGATIVE
GLUCOSE, UA: NEGATIVE mg/dL
Hgb urine dipstick: NEGATIVE
Ketones, ur: 5 mg/dL — AB
Leukocytes, UA: NEGATIVE
Nitrite: NEGATIVE
PROTEIN: 30 mg/dL — AB
Specific Gravity, Urine: 1.031 — ABNORMAL HIGH (ref 1.005–1.030)
pH: 5 (ref 5.0–8.0)

## 2015-12-20 LAB — I-STAT BETA HCG BLOOD, ED (MC, WL, AP ONLY)

## 2015-12-20 LAB — ETHANOL: Alcohol, Ethyl (B): <5 mg/dL (ref <5)

## 2015-12-20 MED ORDER — DIVALPROEX SODIUM ER 500 MG PO TB24
1000.0000 mg | ORAL_TABLET | Freq: Every day | ORAL | Status: DC
  Administered 2015-12-20: 1000 mg via ORAL
  Filled 2015-12-20: qty 2

## 2015-12-20 MED ORDER — OXYBUTYNIN CHLORIDE ER 10 MG PO TB24
10.0000 mg | ORAL_TABLET | Freq: Every day | ORAL | Status: DC
  Administered 2015-12-20: 10 mg via ORAL
  Filled 2015-12-20 (×2): qty 1

## 2015-12-20 MED ORDER — HYDROXYZINE HCL 25 MG PO TABS
25.0000 mg | ORAL_TABLET | Freq: Three times a day (TID) | ORAL | Status: DC | PRN
  Administered 2015-12-20: 25 mg via ORAL
  Filled 2015-12-20: qty 1

## 2015-12-20 MED ORDER — METFORMIN HCL 500 MG PO TABS
500.0000 mg | ORAL_TABLET | Freq: Every day | ORAL | Status: DC
  Administered 2015-12-21: 500 mg via ORAL
  Filled 2015-12-20: qty 1

## 2015-12-20 NOTE — ED Triage Notes (Signed)
Pt arrives to TCU, from [facility], pt stopped taking her medications and is absent of thought and verbalizes  "She died in 05-03-05, and is not of this earth". States she hears voices" .
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
## 2015-12-20 NOTE — ED Provider Notes (Signed)
[place] DEPT Provider Note   CSN: [id number] Arrival date & time: 12/20/15  2005     History   Chief Complaint Chief Complaint  Patient presents with  . Paranoid  . Medical Clearance    HPI [name] [name] is a 35 y.o. female.  HPI Patient presents with involuntary commitment paperwork. Papers were taken out by the patient's pastor. The patient herself gives a rambling account of what is happening, cannot specify why she is here, who she is, what she is doing, she tells a tangential, delusional story about random events. Level V caveat  Past Medical History:  Diagnosis Date  . Bipolar affective disorder, currently manic, mild ([place])   . Diabetes mellitus without complication ([place])   . Schizophrenia [facility])     Patient Active Problem List   Diagnosis Date Noted  . Insomnia   . Anxiety state   . Overactive bladder   . Diabetes mellitus ([place]) 02/08/2015  . Schizoaffective disorder, bipolar type ([place]) 01/28/2015  . Non compliance w medication regimen     No past surgical history on file.  OB History    No data available       Home Medications    Prior to Admission medications   Medication Sig Start Date End Date Taking? Authorizing Provider  divalproex (DEPAKOTE ER) 500 MG 24 hr tablet Take 2 tablets (1,000 mg total) by mouth at bedtime. 07/27/15   Kerrie Buffalo, NP  hydrOXYzine (ATARAX/VISTARIL) 25 MG tablet Take 1 tablet (25 mg total) by mouth 3 (three) times daily as needed for anxiety. 07/27/15   Kerrie Buffalo, NP  metFORMIN (GLUCOPHAGE) 500 MG tablet Take 1 tablet (500 mg total) by mouth daily with breakfast. 07/27/15   Kerrie Buffalo, NP  oxybutynin (DITROPAN-XL) 10 MG 24 hr tablet Take 1 tablet (10 mg total) by mouth at bedtime. 08/07/15   Kerrie Buffalo, NP  paliperidone (INVEGA SUSTENNA) 156 MG/ML SUSP injection Inject 1 mL (156 mg total) into the muscle once. 08/10/15 08/10/15  Kerrie Buffalo, NP  paliperidone (INVEGA SUSTENNA) 156 MG/ML SUSP  injection Inject 0.75 mLs (117 mg total) into the muscle every 28 (twenty-eight) days. Next dose is due 09/07/2015 09/07/15   Kerrie Buffalo, NP  paliperidone (INVEGA) 6 MG 24 hr tablet Take 1 tablet (6 mg total) by mouth at bedtime. 08/07/15 08/10/15  Kerrie Buffalo, NP  pantoprazole (PROTONIX) 40 MG tablet Take 1 tablet (40 mg total) by mouth daily. 08/07/15   Kerrie Buffalo, NP    Family History Family History  Problem Relation Age of Onset  . Drug abuse Maternal Uncle     Social History Social History  Substance Use Topics  . Smoking status: Never Smoker  . Smokeless tobacco: Not on file  . Alcohol use No     Allergies   Patient has no known allergies.   Review of Systems Review of Systems  Unable to perform ROS: Psychiatric disorder     Physical Exam Updated Vital Signs BP 134/94 (BP Location: Right Arm)   Pulse 94   Temp 98 F (36.7 C) (Oral)   Resp 16   LMP  (LMP Unknown)   SpO2 100%   Physical Exam  Constitutional: She appears well-developed and well-nourished. No distress.  HENT:  Head: Normocephalic and atraumatic.  Eyes: Conjunctivae and EOM are normal.  Cardiovascular: Normal rate and regular rhythm.   Pulmonary/Chest: Effort normal and breath sounds normal. No stridor. No respiratory distress.  Abdominal: She exhibits no distension.  Musculoskeletal: She exhibits no  edema.  Neurological: She is alert. No cranial nerve deficit.  Patient does not follow commands reliably, but does move all extremity spontaneously, has clear, tangential speech.  Skin: Skin is warm and dry.  Psychiatric: Her affect is blunt. Her speech is delayed and tangential. Thought content is paranoid and delusional. Cognition and memory are impaired. She is inattentive.  Nursing note and vitals reviewed.    ED Treatments / Results  Labs (all labs ordered are listed, but only abnormal results are displayed) Labs Reviewed  CBC WITH DIFFERENTIAL/PLATELET - Abnormal; Notable for the  following:       Result Value   HCT 35.2 (*)    All other components within normal limits  URINALYSIS, ROUTINE W REFLEX MICROSCOPIC - Abnormal; Notable for the following:    Specific Gravity, Urine 1.031 (*)    Ketones, ur 5 (*)    Protein, ur 30 (*)    Squamous Epithelial / LPF 0-5 (*)    All other components within normal limits  ETHANOL  BASIC METABOLIC PANEL  I-STAT BETA HCG BLOOD, ED ([facility], [facility], [facility] ONLY)    Procedures Procedures (including critical care time)  Medications Ordered in ED Medications  divalproex (DEPAKOTE ER) 24 hr tablet 1,000 mg (1,000 mg Oral Given 12/20/15 2141)  hydrOXYzine (ATARAX/VISTARIL) tablet 25 mg (25 mg Oral Given 12/20/15 2142)  metFORMIN (GLUCOPHAGE) tablet 500 mg (not administered)  oxybutynin (DITROPAN-XL) 24 hr tablet 10 mg (10 mg Oral Given 12/20/15 2141)     Initial Impression / Assessment and Plan / ED Course  I have reviewed the triage vital signs and the nursing notes.  Pertinent labs & imaging results that were available during my care of the patient were reviewed by me and considered in my medical decision making (see chart for details).  Clinical Course     Patient with a history of schizoaffective disorder presents with overtly psychotic speech, behavior. Patient has involuntary commitment papers already executed. Patient is in no physical distress, has no physical complaints, is hemodynamically stable. Patient medically clear.   Final Clinical Impressions(s) / ED Diagnoses  Psychosis   Carmin Muskrat, MD 12/20/15 2322

## 2015-12-21 ENCOUNTER — Encounter (HOSPITAL_COMMUNITY): Payer: Self-pay

## 2015-12-21 ENCOUNTER — Inpatient Hospital Stay (HOSPITAL_COMMUNITY)
Admission: AD | Admit: 2015-12-21 | Discharge: 2016-01-09 | DRG: 885 | Disposition: A | Discharge status: Home / Self Care | Payer: Medicare Other | Attending: Psychiatry | Admitting: Psychiatry

## 2015-12-21 DIAGNOSIS — G4700 Insomnia, unspecified: Secondary | ICD-10-CM

## 2015-12-21 DIAGNOSIS — Z794 Long term (current) use of insulin: Secondary | ICD-10-CM | POA: Diagnosis not present

## 2015-12-21 DIAGNOSIS — F411 Generalized anxiety disorder: Secondary | ICD-10-CM

## 2015-12-21 DIAGNOSIS — Z9119 Patient's noncompliance with other medical treatment and regimen: Secondary | ICD-10-CM

## 2015-12-21 DIAGNOSIS — Z813 Family history of other psychoactive substance abuse and dependence: Secondary | ICD-10-CM

## 2015-12-21 DIAGNOSIS — Z79899 Other long term (current) drug therapy: Secondary | ICD-10-CM | POA: Diagnosis not present

## 2015-12-21 DIAGNOSIS — F250 Schizoaffective disorder, bipolar type: Secondary | ICD-10-CM

## 2015-12-21 DIAGNOSIS — E119 Type 2 diabetes mellitus without complications: Secondary | ICD-10-CM | POA: Diagnosis present

## 2015-12-21 DIAGNOSIS — F29 Unspecified psychosis not due to a substance or known physiological condition: Secondary | ICD-10-CM | POA: Diagnosis not present

## 2015-12-21 DIAGNOSIS — N3281 Overactive bladder: Secondary | ICD-10-CM | POA: Diagnosis not present

## 2015-12-21 DIAGNOSIS — Z9114 Patient's other noncompliance with medication regimen: Secondary | ICD-10-CM

## 2015-12-21 DIAGNOSIS — F23 Brief psychotic disorder: Secondary | ICD-10-CM | POA: Diagnosis present

## 2015-12-21 DIAGNOSIS — Z91148 Patient's other noncompliance with medication regimen for other reason: Secondary | ICD-10-CM

## 2015-12-21 MED ORDER — DIVALPROEX SODIUM ER 500 MG PO TB24
ORAL_TABLET | ORAL | Status: AC
  Filled 2015-12-21: qty 2

## 2015-12-21 MED ORDER — METFORMIN HCL 500 MG PO TABS
500.0000 mg | ORAL_TABLET | Freq: Every day | ORAL | Status: DC
  Administered 2015-12-22 – 2016-01-09 (×14): 500 mg via ORAL
  Filled 2015-12-21 (×16): qty 1
  Filled 2015-12-21: qty 7
  Filled 2015-12-21 (×6): qty 1

## 2015-12-21 MED ORDER — LORAZEPAM 1 MG PO TABS
1.0000 mg | ORAL_TABLET | Freq: Once | ORAL | Status: AC
  Administered 2015-12-21: 1 mg via ORAL
  Filled 2015-12-21: qty 1

## 2015-12-21 MED ORDER — ALUM & MAG HYDROXIDE-SIMETH 200-200-20 MG/5ML PO SUSP
30.0000 mL | ORAL | Status: DC | PRN
  Administered 2015-12-27 – 2016-01-07 (×3): 30 mL via ORAL
  Filled 2015-12-21 (×3): qty 30

## 2015-12-21 MED ORDER — DIVALPROEX SODIUM ER 500 MG PO TB24
1000.0000 mg | ORAL_TABLET | Freq: Every day | ORAL | Status: DC
  Administered 2015-12-21 – 2015-12-22 (×2): 1000 mg via ORAL
  Filled 2015-12-21 (×4): qty 2

## 2015-12-21 MED ORDER — MAGNESIUM HYDROXIDE 400 MG/5ML PO SUSP: 30.0000 mL | Freq: Every day | ORAL | Status: DC | PRN

## 2015-12-21 MED ORDER — OXYBUTYNIN CHLORIDE ER 5 MG PO TB24
10.0000 mg | ORAL_TABLET | Freq: Every day | ORAL | Status: DC
  Administered 2015-12-21 – 2016-01-08 (×14): 10 mg via ORAL
  Filled 2015-12-21 (×6): qty 1
  Filled 2015-12-21: qty 14
  Filled 2015-12-21 (×19): qty 1

## 2015-12-21 MED ORDER — POTASSIUM CHLORIDE CRYS ER 20 MEQ PO TBCR
40.0000 meq | EXTENDED_RELEASE_TABLET | Freq: Once | ORAL | Status: AC
  Administered 2015-12-21: 40 meq via ORAL
  Filled 2015-12-21: qty 2

## 2015-12-21 MED ORDER — HYDROXYZINE HCL 25 MG PO TABS
25.0000 mg | ORAL_TABLET | Freq: Three times a day (TID) | ORAL | Status: DC | PRN
  Administered 2015-12-22 (×2): 25 mg via ORAL
  Filled 2015-12-21: qty 1
  Filled 2015-12-21: qty 10

## 2015-12-21 MED ORDER — ACETAMINOPHEN 325 MG PO TABS
650.0000 mg | ORAL_TABLET | Freq: Four times a day (QID) | ORAL | Status: DC | PRN
  Administered 2016-01-07: 650 mg via ORAL
  Filled 2015-12-21 (×2): qty 2

## 2015-12-21 NOTE — ED Notes (Addendum)
Patient staring into space not responding when nurse asked how she was feeling this morning.  Not eating her breakfast and taking no interest in her meal tray at all.  She said a few words earlier, but they were out of context and had no meaning to this Probation officer.
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
## 2015-12-21 NOTE — ED Notes (Signed)
Sheriff's department notified of transport.

## 2015-12-21 NOTE — BH Assessment (Signed)
[facility] Assessment Progress Note  Per Corena Pilgrim, MD, this pt requires psychiatric hospitalization.  Letitia Libra, RN, [facility] has assigned pt to [facility] Rm 508-1.  Pt presents under IVC initiated by Robin Searing, MD at [facility], and IVC documents have been faxed to [facility].  Pt's nurse has been notified, and agrees to call report to [phone].  Pt is to be transported via Event organiser.   Jalene Mullet, [place] Triage Specialist [phone]

## 2015-12-21 NOTE — Progress Notes (Signed)
Patient ID: [name] [name], female   DOB: December 08, 1980, 35 y.o.   MRN: [id number] Per State regulations 482.30 this chart was reviewed for medical necessity with respect to the patient's admission/duration of stay.    Next review date: 12/25/15  Debarah Crape, BSN, RN-BC  Case Manager

## 2015-12-21 NOTE — Progress Notes (Signed)
[name] [name] is a 35 year old female being admitted involuntarily to [facility] from [facility].  She came in under IVC from [place].  She was brought in to [facility] by her pastor due to confusion, A/V hallucinations and staring in to space.  She was a poor historian and unable to answer many questions appropriately.  During admission, she was very tangential and was unable to answer many questions appropriately.  She denied pain or discomfort.  She was very paranoid and kept thinking staff were talking about her.  She was unable to complete some of the admission paperwork because she couldn't understand the questions.  Oriented her to the unit.  Skin assessment completed and no skin issues noted.  Belongings searched and locked in to locker 13.  Encouraged participation in group and unit activities.  We will continue to monitor the progress towards her goals.

## 2015-12-21 NOTE — Tx Team (Signed)
Initial Treatment Plan 12/21/2015 10:08 PM Tariana Bonfiglio [id number]    PATIENT STRESSORS: Financial difficulties Medication change or noncompliance   PATIENT STRENGTHS: Physical Health Religious Affiliation   PATIENT IDENTIFIED PROBLEMS: Psychosis  Non compliance with medication  "I just don't know why I'm here, I woke up there"  "I just want to know where my pastor is"               DISCHARGE CRITERIA:  Improved stabilization in mood, thinking, and/or behavior Verbal commitment to aftercare and medication compliance  PRELIMINARY DISCHARGE PLAN: Outpatient therapy medication management  PATIENT/FAMILY INVOLVEMENT: This treatment plan has been presented to and reviewed with the patient, [facility].  The patient and family have been given the opportunity to ask questions and make suggestions.  Windell Moment, RN 12/21/2015, 10:08 PM

## 2015-12-21 NOTE — ED Notes (Signed)
Attempted to call report to behavioral health but no answer at this time.

## 2015-12-21 NOTE — ED Notes (Addendum)
PD x2 present, sitter/NT present, pt out in w/c to go to [facility]. Pt reluctantly cooperative, and paranoid. Reluctantly non-resistant. Pt belongings given to PD.

## 2015-12-21 NOTE — Progress Notes (Signed)
Asked Registration to check to see if pt continues to have medicaid Pt listed with medicaid in 02/2015

## 2015-12-21 NOTE — ED Notes (Addendum)
PD here for transport, pending arrival of 2nd PD officer. Pt increasingly anxious/agitated. Tangential scattered thoughts, not making sense. Wringing hands, pacing room, frequently in [place]. Pt updated. Security and sitters present. Attempted phone contact with [place] x2, no response.

## 2015-12-21 NOTE — ED Notes (Signed)
Report called to behavioral health. 

## 2015-12-22 ENCOUNTER — Encounter (HOSPITAL_COMMUNITY): Payer: Self-pay | Admitting: Psychiatry

## 2015-12-22 DIAGNOSIS — Z9114 Patient's other noncompliance with medication regimen: Secondary | ICD-10-CM

## 2015-12-22 LAB — GLUCOSE, CAPILLARY
GLUCOSE-CAPILLARY: 90 mg/dL (ref 65–99)
Glucose-Capillary: 70 mg/dL (ref 65–99)

## 2015-12-22 MED ORDER — LORAZEPAM 1 MG PO TABS: 1.0000 mg | ORAL_TABLET | Freq: Four times a day (QID) | ORAL | Status: DC | PRN

## 2015-12-22 MED ORDER — OLANZAPINE 10 MG IM SOLR: 10.0000 mg | Freq: Three times a day (TID) | INTRAMUSCULAR | Status: DC | PRN

## 2015-12-22 MED ORDER — INSULIN ASPART 100 UNIT/ML ~~LOC~~ SOLN
0.0000 [IU] | Freq: Three times a day (TID) | SUBCUTANEOUS | Status: DC
  Administered 2015-12-22 – 2015-12-26 (×2): 0 [IU] via SUBCUTANEOUS

## 2015-12-22 MED ORDER — ARIPIPRAZOLE 5 MG PO TABS
5.0000 mg | ORAL_TABLET | Freq: Every day | ORAL | Status: DC
  Administered 2015-12-22: 5 mg via ORAL
  Filled 2015-12-22 (×2): qty 1

## 2015-12-22 MED ORDER — ARIPIPRAZOLE 5 MG PO TABS
2.5000 mg | ORAL_TABLET | ORAL | Status: DC
  Administered 2015-12-22 – 2015-12-23 (×2): 2.5 mg via ORAL
  Filled 2015-12-22 (×5): qty 1

## 2015-12-22 MED ORDER — OLANZAPINE 10 MG PO TABS: 10.0000 mg | ORAL_TABLET | Freq: Three times a day (TID) | ORAL | Status: DC | PRN

## 2015-12-22 MED ORDER — INSULIN ASPART 100 UNIT/ML ~~LOC~~ SOLN: 0.0000 [IU] | Freq: Every day | SUBCUTANEOUS | Status: DC

## 2015-12-22 MED ORDER — LORAZEPAM 2 MG/ML IJ SOLN: 1.0000 mg | Freq: Four times a day (QID) | INTRAMUSCULAR | Status: DC | PRN

## 2015-12-22 MED ORDER — TRAZODONE HCL 50 MG PO TABS: 50.0000 mg | ORAL_TABLET | Freq: Every evening | ORAL | Status: DC | PRN

## 2015-12-22 MED ORDER — MIRTAZAPINE 7.5 MG PO TABS
7.5000 mg | ORAL_TABLET | Freq: Every day | ORAL | Status: DC
  Administered 2015-12-22 – 2016-01-01 (×6): 7.5 mg via ORAL
  Filled 2015-12-22 (×14): qty 1

## 2015-12-22 NOTE — Progress Notes (Signed)
Did not attend group 

## 2015-12-22 NOTE — Progress Notes (Signed)
D: Patient labile and irritable in her responses with staff on assessment. Pt dismissive with nurse. Pt compliant with HS medications, but refused to have her CBG checked this shift. Pt remains isolative to her room. A: Q 15 minute safety checks, encourage staff/peer interaction and group participation. Administer medications as ordered. R: No signs/symptoms of distress this shift.

## 2015-12-22 NOTE — BHH Counselor (Signed)
Clinical Social Work Note  Attempted to meet with patient to do Psychosocial Assessment.  Was informed by staff she was sleeping very heavily and should not be disturbed.  Selmer Dominion, LCSW 12/22/2015, 5:30 PM
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
## 2015-12-22 NOTE — Progress Notes (Signed)
Pt was agitated at beginning of shift, paranoid, delusional. Wandering unit and repeatedly asking staff why we were killing people, insisting that Corene Cornea is dead, that we are killing her, everyone is dead. Wandering into other's rooms. Pt calmer now, did not go to cafeteria for lunch, did not eat lunch that was brought to her. Suspicious. Guarded.

## 2015-12-22 NOTE — BHH Group Notes (Signed)
[place] Group Notes: (Clinical Social Work)   12/22/2015      Type of Therapy:  Group Therapy   Participation Level:  Did Not Attend despite MHT prompting   Selmer Dominion, LCSW 12/22/2015, 1:51 PM

## 2015-12-22 NOTE — BHH Group Notes (Addendum)
Patient refused to attend nurse education group despite MHT and RN prompting.

## 2015-12-22 NOTE — BHH Suicide Risk Assessment (Signed)
[facility] Admission Suicide Risk Assessment   Nursing information obtained from:  Patient Demographic factors:  NA (Pt is psychotic and unable to answer questions appropriately) Current Mental Status:  NA Loss Factors:  NA (Pt is psychotic and unable to answer questions appropriately) Historical Factors:  NA (Pt is psychotic and unable to answer questions appropriately) Risk Reduction Factors:  NA (Pt is psychotic and unable to answer questions appropriately)  Total Time spent with patient: 30 minutes Principal Problem: Schizoaffective disorder, bipolar type ([place]) Diagnosis:   Patient Active Problem List   Diagnosis Date Noted  . Insomnia [G47.00]   . Anxiety state [F41.1]   . Overactive bladder [N32.81]   . Diabetes mellitus ([place]) [E11.9] 02/08/2015  . Schizoaffective disorder, bipolar type ([place]) [F25.0] 01/28/2015  . Non compliance w medication regimen [Z91.14]    Subjective Data: Please see H&P.   Continued Clinical Symptoms:  Alcohol Use Disorder Identification Test Final Score (AUDIT): 0 The "Alcohol Use Disorders Identification Test", Guidelines for Use in Primary Care, Second Edition.  World Pharmacologist [facility]). Score between 0-7:  no or low risk or alcohol related problems. Score between 8-15:  moderate risk of alcohol related problems. Score between 16-19:  high risk of alcohol related problems. Score 20 or above:  warrants further diagnostic evaluation for alcohol dependence and treatment.   CLINICAL FACTORS:   Severe Anxiety and/or Agitation Currently Psychotic Unstable or Poor Therapeutic Relationship Previous Psychiatric Diagnoses and Treatments Medical Diagnoses and Treatments/Surgeries   Musculoskeletal: Strength & Muscle Tone: within normal limits Gait & Station: normal Patient leans: N/A  Psychiatric Specialty Exam: Physical Exam  ROS  Blood pressure 136/79, pulse (!) 131, temperature 99.1 F (37.3 C), temperature source Oral, resp. rate 18, height  5\' 4"  (1.626 m), weight 68 kg (150 lb), SpO2 100 %.Body mass index is 25.75 kg/m.                Please see H&P.                                           COGNITIVE FEATURES THAT CONTRIBUTE TO RISK:  Closed-mindedness, Polarized thinking and Thought constriction (tunnel vision)    SUICIDE RISK:   Moderate:  Frequent suicidal ideation with limited intensity, and duration, some specificity in terms of plans, no associated intent, good self-control, limited dysphoria/symptomatology, some risk factors present, and identifiable protective factors, including available and accessible social support.   PLAN OF CARE: Please see H&P.   I certify that inpatient services furnished can reasonably be expected to improve the patient's condition.  Anarie Kalish, MD 12/22/2015, 12:01 PM

## 2015-12-23 MED ORDER — GLUCERNA SHAKE PO LIQD
237.0000 mL | Freq: Three times a day (TID) | ORAL | Status: DC
  Administered 2015-12-23 – 2015-12-29 (×10): 237 mL via ORAL
  Administered 2015-12-30: 21:00:00 via ORAL
  Administered 2015-12-31 – 2016-01-09 (×20): 237 mL via ORAL

## 2015-12-23 MED ORDER — ARIPIPRAZOLE 15 MG PO TABS
15.0000 mg | ORAL_TABLET | Freq: Every day | ORAL | Status: DC
  Filled 2015-12-23 (×3): qty 1

## 2015-12-23 NOTE — [facility] Group Notes (Signed)
Patient did not attend group.

## 2015-12-23 NOTE — BHH Counselor (Signed)
Clinical Social Work Note  Attempted to do Psychosocial Assessment, but patient was unable to appropriately participate.  Will be attempted by CSW tomorrow 12/24/15.  Selmer Dominion, LCSW 12/23/2015, 4:00 PM

## 2015-12-23 NOTE — Progress Notes (Signed)
NUTRITION ASSESSMENT  Pt identified as at risk on the Malnutrition Screen Tool  INTERVENTION: 1. Supplements: Glucerna Shake po TID, each supplement provides 220 kcal and 10 grams of protein  NUTRITION DIAGNOSIS: Unintentional weight loss related to sub-optimal intake as evidenced by pt report.   Goal: Pt to meet >/= 90% of their estimated nutrition needs.  Monitor:  PO intake  Assessment:  Pt admitted with schizoaffective disorder and noncompliance with medications. Pt with AMS and is a poor historian. Per chart review, pt has lost 50 lb since May 2017 (25% wt loss x 7 months, significant for time frame). Pt with history of diabetes and received diet education from a dietitian in July. Per nursing notes, pt has been refusing meals. Pt would benefit from nutritional supplements. RD to order Glucerna shakes.   Height: Ht Readings from Last 1 Encounters:  12/21/15 5\' 4"  (1.626 m)    Weight: Wt Readings from Last 1 Encounters:  12/21/15 150 lb (68 kg)    Weight Hx: Wt Readings from Last 10 Encounters:  12/21/15 150 lb (68 kg)  05/31/15 200 lb (90.7 kg)  05/20/15 200 lb (90.7 kg)  05/16/15 200 lb (90.7 kg)  02/07/15 200 lb (90.7 kg)  01/29/15 198 lb (89.8 kg)  01/03/15 199 lb (90.3 kg)    BMI:  Body mass index is 25.75 kg/m. Pt meets criteria for overweight based on current BMI.  Estimated Nutritional Needs: Kcal: 25-30 kcal/kg Protein: > 1 gram protein/kg Fluid: 1 ml/kcal  Diet Order: Diet Carb Modified Fluid consistency: Thin; Room service appropriate? Yes Pt is also offered choice of unit snacks mid-morning and mid-afternoon.  Pt is eating as desired.   Lab results and medications reviewed.   Clayton Bibles, MS, RD, LDN Pager: [phone] After Hours Pager: [phone]

## 2015-12-23 NOTE — BHH Group Notes (Signed)
[place] Group Notes: (Clinical Social Work)   12/23/2015      Type of Therapy:  Group Therapy   Participation Level:  Did Not Attend despite MHT prompting   Selmer Dominion, LCSW 12/23/2015, 12:50 PM

## 2015-12-23 NOTE — Progress Notes (Signed)
Refused evening CBG, reports "it's not necessary"

## 2015-12-23 NOTE — Progress Notes (Signed)
[facility] MD Progress Note  12/23/2015 11:42 AM [name] [name]  MRN:  [id number] Subjective: Patient seen with eye closed , is partially mute.  Objective:[name] Murray-Tayloris a  35 y.o.female,who has a hx of schizoaffective disorder , well known to [facility] , has a hx of being noncompliant on medications, presented IVCed to [facility] for psychosis.  Patient seen and chart reviewed.Discussed patient with treatment team.  Patient today seen withdrawn , isolative , with eyes closed , uncooperative with assessment this AM. Several attempts were made to evaluate the patient. Pt after several attempts was able to answer some questions - states " I am ok." Pt did not respond to questions about her mood or perception. Pt however is alert to person, situation, place . Pt encouraged to get out of bed and participate in milieu. Per RN - pt has been withdrawn since AM - continues to need a lot of support.     Principal Problem: Schizoaffective disorder, bipolar type ([place]) Diagnosis:   Patient Active Problem List   Diagnosis Date Noted  . Insomnia [G47.00]   . Anxiety state [F41.1]   . Overactive bladder [N32.81]   . Diabetes mellitus ([place]) [E11.9] 02/08/2015  . Schizoaffective disorder, bipolar type ([place]) [F25.0] 01/28/2015  . Non compliance w medication regimen [Z91.14]    Total Time spent with patient: 25 minutes  Past Psychiatric History: Please see H&P.   Past Medical History:  Past Medical History:  Diagnosis Date  . Bipolar affective disorder, currently manic, mild ([place])   . Diabetes mellitus without complication ([place])   . Schizophrenia ([place])    History reviewed. No pertinent surgical history. Family History:  Family History  Problem Relation Age of Onset  . Drug abuse Maternal Uncle    Family Psychiatric  History: Please see H&P.  Social History: Please see H&P.  History  Alcohol Use No     History  Drug Use No    Social History   Social History  . Marital  status: Legally Separated    Spouse name: N/A  . Number of children: N/A  . Years of education: N/A   Social History Main Topics  . Smoking status: Never Smoker  . Smokeless tobacco: Never Used  . Alcohol use No  . Drug use: No  . Sexual activity: Not Currently   Other Topics Concern  . None   Social History Narrative  . None   Additional Social History:    Pain Medications: See MAR  Prescriptions: See MAR Over the Counter: See MAR History of alcohol / drug use?: No history of alcohol / drug abuse                    Sleep: observed as fair  Appetite:  Poor  Current Medications: Current Facility-Administered Medications  Medication Dose Route Frequency Provider Last Rate Last Dose  . acetaminophen (TYLENOL) tablet 650 mg  650 mg Oral Q6H PRN Benjamine Mola, FNP      . alum & mag hydroxide-simeth (MAALOX/MYLANTA) 200-200-20 MG/5ML suspension 30 mL  30 mL Oral Q4H PRN Benjamine Mola, FNP      . ARIPiprazole (ABILIFY) tablet 15 mg  15 mg Oral QHS Jurnei Latini, MD      . divalproex (DEPAKOTE ER) 24 hr tablet 1,000 mg  1,000 mg Oral QHS Benjamine Mola, FNP   1,000 mg at 12/22/15 2125  . feeding supplement (GLUCERNA SHAKE) (GLUCERNA SHAKE) liquid 237 mL  237 mL Oral TID BM Marnie Fazzino,  MD   237 mL at 12/23/15 1028  . hydrOXYzine (ATARAX/VISTARIL) tablet 25 mg  25 mg Oral TID PRN Benjamine Mola, FNP   25 mg at 12/22/15 2125  . insulin aspart (novoLOG) injection 0-15 Units  0-15 Units Subcutaneous TID WC Ursula Alert, MD   0 Units at 12/22/15 1704  . insulin aspart (novoLOG) injection 0-5 Units  0-5 Units Subcutaneous QHS Valentina Alcoser, MD      . LORazepam (ATIVAN) tablet 1 mg  1 mg Oral Q6H PRN Ursula Alert, MD       Or  . LORazepam (ATIVAN) injection 1 mg  1 mg Intramuscular Q6H PRN Arelys Glassco, MD      . magnesium hydroxide (MILK OF MAGNESIA) suspension 30 mL  30 mL Oral Daily PRN Benjamine Mola, FNP      . metFORMIN (GLUCOPHAGE) tablet 500 mg  500 mg Oral  Q breakfast Benjamine Mola, FNP   500 mg at 12/23/15 0846  . mirtazapine (REMERON) tablet 7.5 mg  7.5 mg Oral QHS Ursula Alert, MD   7.5 mg at 12/22/15 2125  . OLANZapine (ZYPREXA) tablet 10 mg  10 mg Oral TID PRN Ursula Alert, MD       Or  . OLANZapine (ZYPREXA) injection 10 mg  10 mg Intramuscular TID PRN Ursula Alert, MD      . oxybutynin (DITROPAN-XL) 24 hr tablet 10 mg  10 mg Oral QHS Benjamine Mola, FNP   10 mg at 12/22/15 2125    Lab Results:  Results for orders placed or performed during the hospital encounter of 12/21/15 (from the past 48 hour(s))  Glucose, capillary     Status: None   Collection Time: 12/22/15 12:21 PM  Result Value Ref Range   Glucose-Capillary 90 65 - 99 mg/dL  Glucose, capillary     Status: None   Collection Time: 12/22/15  4:56 PM  Result Value Ref Range   Glucose-Capillary 70 65 - 99 mg/dL    Blood Alcohol level:  Lab Results  Component Value Date   ETH <5 12/20/2015   ETH <5 123XX123    Metabolic Disorder Labs: Lab Results  Component Value Date   HGBA1C 5.6 07/25/2015   MPG 114 07/25/2015   MPG 137 02/01/2015   Lab Results  Component Value Date   PROLACTIN 46.5 (H) 07/25/2015   PROLACTIN 21.6 02/01/2015   Lab Results  Component Value Date   CHOL 135 07/25/2015   TRIG 59 07/25/2015   HDL 50 07/25/2015   CHOLHDL 2.7 07/25/2015   VLDL 12 07/25/2015   LDLCALC 73 07/25/2015   LDLCALC 78 02/01/2015    Physical Findings: AIMS: Facial and Oral Movements Muscles of Facial Expression: None, normal Lips and Perioral Area: None, normal Jaw: None, normal Tongue: None, normal,Extremity Movements Upper (arms, wrists, hands, fingers): None, normal Lower (legs, knees, ankles, toes): None, normal, Trunk Movements Neck, shoulders, hips: None, normal, Overall Severity Severity of abnormal movements (highest score from questions above): None, normal Incapacitation due to abnormal movements: None, normal Patient's awareness of abnormal  movements (rate only patient's report): No Awareness, Dental Status Current problems with teeth and/or dentures?: No Does patient usually wear dentures?: No  CIWA:    COWS:     Musculoskeletal: Strength & Muscle Tone: within normal limits Gait & Station: normal Patient leans: N/A  Psychiatric Specialty Exam: Physical Exam  Nursing note and vitals reviewed.   Review of Systems  Unable to perform ROS: Mental acuity  Blood pressure 136/79, pulse (!) 131, temperature 99.1 F (37.3 C), temperature source Oral, resp. rate 18, height 5\' 4"  (1.626 m), weight 68 kg (150 lb), SpO2 100 %.Body mass index is 25.75 kg/m.  General Appearance: Guarded  Eye Contact:  None  Speech:  Slow and limited  Volume:  Decreased  Mood:  does not respond  Affect:  Depressed  Thought Process:  Irrelevant and Descriptions of Associations: Tangential  Orientation:  Other:  situation, place, person  Thought Content:  Delusions and Paranoid Ideation  Suicidal Thoughts:  does not respond  Homicidal Thoughts:  does not respond  Memory:  Immediate;   Fair Recent;   Poor Remote;   Poor  Judgement:  Poor  Insight:  Shallow  Psychomotor Activity:  Decreased  Concentration:  Concentration: Poor and Attention Span: Poor  Recall:  Poor  Fund of Knowledge:  Poor  Language:  Fair  Akathisia:  No  Handed:  Right  AIMS (if indicated):     Assets:  Others:  access to healthcare  ADL's:  Impaired  Cognition:  WNL  Sleep:  Number of Hours: 6.75     Treatment Plan Summary:Patient seen as partially mute - response to questions are limited and in monosyllables , stays in bed with eyes closed - does open for a short time when asked. Pt when she speaks is clear and coherent, and is alert to place, person and situation. Continue treatment.  Schizoaffective disorder, bipolar type ([place]) unstable  Will continue today 12/23/15  plan as below except where it is noted.   Daily contact with patient to assess and  evaluate symptoms and progress in treatment and Medication management For Psychosis: Will change Abilify to 15 mg po qhs.   For Mood lability: Depakote ER 1000 mg po at bedtime. Depakote level on 12/25/15.   For anxiety/agitation: PRN medications as per agitation protocol.  For insomnia: Remeron 7.5 mg po qhs.  For Diabetes: Restart home medications. SSI /CBG as per unit protocol. Hba1c-ordered.  Reviewed past medical records,treatment plan.   Will continue to monitor vitals ,medication compliance and treatment side effects while patient is here.   Will monitor for medical issues as well as call consult as needed.   Reviewed labs- pt refused all labs ordered this AM .   CSW will continue working on disposition.   Patient to participate in therapeutic milieu  Alexandre Faries, MD 12/23/2015, 11:42 AM

## 2015-12-23 NOTE — Progress Notes (Signed)
[name] [name] was in room and in bed for much of the evening, she did not get out of bed for evening activities, refused all medications as well. Derrika asked about how long she was going to be kept here and was explained she needed to speak with her psychiatrist, pt was encouraged to take medications but still refused and then closed eyes and no longer would participate in any conversation with staff. A. Support and encouragement provided, medication education attempted. Artemio Aly refused education, safety maintained.

## 2015-12-24 LAB — GLUCOSE, CAPILLARY
GLUCOSE-CAPILLARY: 105 mg/dL — AB (ref 65–99)
Glucose-Capillary: 90 mg/dL (ref 65–99)
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
## 2015-12-24 NOTE — Progress Notes (Signed)
Recreation Therapy Notes  Date: 12/24/15 Time: 1000 Location: 500 Hall Dayroom  Group Topic: Coping Skills  Goal Area(s) Addresses:  Patients will be able to identify the triggers for coping skills. Patients will be able to identify coping skills for triggers. Patients will be able to identify the benefits of using coping skills post d/c.   Intervention: Worksheet, pencils  Activity: Coping Skills Mindmap.  LRT assisted patients in filling out the center rectangle and eight (8) corresponding rectangles with triggers that initiate the need for coping skills.  Once the triggers were filled in, patients were to identify three (3) coping skills for each trigger.        Education: Radiographer, therapeutic, Dentist.   Education Outcome: Acknowledges understanding/In group clarification offered/Needs additional education.   Clinical Observations/Feedback: Pt did not attend group.   Victorino Sparrow, LRT/CTRS       Victorino Sparrow A 12/24/2015 12:19 PM

## 2015-12-24 NOTE — Progress Notes (Signed)
D: Pt A & O to self and place. Pt is guarded, isolative and withdrawned, depressed and irritable at intervals. Pt remains paranoid and delusional; came up to the nursing station from her room to inform staff she believes "my step daughter wants my [id number] insurance policy, I don't want her to have it, I left her father long time ago".  A: Emotional support and availability provided to pt. Medications offered to pt on multiple occassions but to no  avail. CBG done with increased verbal encouragement. Writer encouraged pt to voice needs, attend groups and comply with treatment regimen. Q 15 minutes checks maintained on unit without issues.  R: Pt did not attended groups on unit. Pt refused scheduled medications this shift. Ate 50% of lunch and 100% of dinner POC maintained for safety and mood stability.

## 2015-12-24 NOTE — Tx Team (Signed)
Interdisciplinary Treatment and Diagnostic Plan Update  12/24/2015 Time of Session: 8:37 AM  [name] [name] MRN: [id number]  Principal Diagnosis: Schizoaffective disorder, bipolar type ([place])  Secondary Diagnoses: Principal Problem:   Schizoaffective disorder, bipolar type ([place]) Active Problems:   Non compliance w medication regimen   Diabetes mellitus ([place])   Current Medications:  Current Facility-Administered Medications  Medication Dose Route Frequency Provider Last Rate Last Dose  . acetaminophen (TYLENOL) tablet 650 mg  650 mg Oral Q6H PRN Benjamine Mola, FNP      . alum & mag hydroxide-simeth (MAALOX/MYLANTA) 200-200-20 MG/5ML suspension 30 mL  30 mL Oral Q4H PRN Benjamine Mola, FNP      . ARIPiprazole (ABILIFY) tablet 15 mg  15 mg Oral QHS Saramma Eappen, MD      . divalproex (DEPAKOTE ER) 24 hr tablet 1,000 mg  1,000 mg Oral QHS Benjamine Mola, FNP   1,000 mg at 12/22/15 2125  . feeding supplement (GLUCERNA SHAKE) (GLUCERNA SHAKE) liquid 237 mL  237 mL Oral TID BM Saramma Eappen, MD   237 mL at 12/23/15 1028  . hydrOXYzine (ATARAX/VISTARIL) tablet 25 mg  25 mg Oral TID PRN Benjamine Mola, FNP   25 mg at 12/22/15 2125  . insulin aspart (novoLOG) injection 0-15 Units  0-15 Units Subcutaneous TID WC Ursula Alert, MD   0 Units at 12/22/15 1704  . insulin aspart (novoLOG) injection 0-5 Units  0-5 Units Subcutaneous QHS Ursula Alert, MD   Stopped at 12/23/15 2203  . LORazepam (ATIVAN) tablet 1 mg  1 mg Oral Q6H PRN Ursula Alert, MD       Or  . LORazepam (ATIVAN) injection 1 mg  1 mg Intramuscular Q6H PRN Saramma Eappen, MD      . magnesium hydroxide (MILK OF MAGNESIA) suspension 30 mL  30 mL Oral Daily PRN Benjamine Mola, FNP      . metFORMIN (GLUCOPHAGE) tablet 500 mg  500 mg Oral Q breakfast Benjamine Mola, FNP   500 mg at 12/23/15 0846  . mirtazapine (REMERON) tablet 7.5 mg  7.5 mg Oral QHS Ursula Alert, MD   7.5 mg at 12/22/15 2125  . OLANZapine (ZYPREXA) tablet 10  mg  10 mg Oral TID PRN Ursula Alert, MD       Or  . OLANZapine (ZYPREXA) injection 10 mg  10 mg Intramuscular TID PRN Ursula Alert, MD      . oxybutynin (DITROPAN-XL) 24 hr tablet 10 mg  10 mg Oral QHS Benjamine Mola, FNP   10 mg at 12/22/15 2125    PTA Medications: Prescriptions Prior to Admission  Medication Sig Dispense Refill Last Dose  . divalproex (DEPAKOTE ER) 500 MG 24 hr tablet Take 2 tablets (1,000 mg total) by mouth at bedtime. (Patient not taking: Reported on 12/21/2015) 60 tablet 0 Not Taking at Unknown time  . hydrOXYzine (ATARAX/VISTARIL) 25 MG tablet Take 1 tablet (25 mg total) by mouth 3 (three) times daily as needed for anxiety. (Patient not taking: Reported on 12/21/2015) 30 tablet 0 Not Taking at Unknown time  . metFORMIN (GLUCOPHAGE) 500 MG tablet Take 1 tablet (500 mg total) by mouth daily with breakfast. (Patient not taking: Reported on 12/21/2015) 30 tablet 0 Not Taking at Unknown time  . oxybutynin (DITROPAN-XL) 10 MG 24 hr tablet Take 1 tablet (10 mg total) by mouth at bedtime. (Patient not taking: Reported on 12/21/2015) 30 tablet 0 Not Taking at Unknown time  . paliperidone (INVEGA SUSTENNA) 156 MG/ML  SUSP injection Inject 1 mL (156 mg total) into the muscle once. 0.9 mL 0   . paliperidone (INVEGA SUSTENNA) 156 MG/ML SUSP injection Inject 0.75 mLs (117 mg total) into the muscle every 28 (twenty-eight) days. Next dose is due 09/07/2015 (Patient not taking: Reported on 12/21/2015) 0.9 mL 0 Not Taking at Unknown time  . paliperidone (INVEGA) 6 MG 24 hr tablet Take 1 tablet (6 mg total) by mouth at bedtime. 3 tablet 0   . pantoprazole (PROTONIX) 40 MG tablet Take 1 tablet (40 mg total) by mouth daily. (Patient not taking: Reported on 12/21/2015) 30 tablet 0 Not Taking at Unknown time    Treatment Modalities: Medication Management, Group therapy, Case management,  1 to 1 session with clinician, Psychoeducation, Recreational therapy.   Physician Treatment Plan for  Primary Diagnosis: Schizoaffective disorder, bipolar type ([place]) Long Term Goal(s): Improvement in symptoms so as ready for discharge  Short Term Goals: Ability to verbalize feelings will improve   Medication Management: Evaluate patient's response, side effects, and tolerance of medication regimen.  Therapeutic Interventions: 1 to 1 sessions, Unit Group sessions and Medication administration.  Evaluation of Outcomes: Progressing  Physician Treatment Plan for Secondary Diagnosis: Principal Problem:   Schizoaffective disorder, bipolar type ([place]) Active Problems:   Non compliance w medication regimen   Diabetes mellitus ([place])   Long Term Goal(s): Improvement in symptoms so as ready for discharge  Short Term Goals:  Compliance with prescribed medications will improve  Medication Management: Evaluate patient's response, side effects, and tolerance of medication regimen.  Therapeutic Interventions: 1 to 1 sessions, Unit Group sessions and Medication administration.  Evaluation of Outcomes: Progressing   RN Treatment Plan for Primary Diagnosis: Schizoaffective disorder, bipolar type ([place]) Long Term Goal(s): Knowledge of disease and therapeutic regimen to maintain health will improve  Short Term Goals: Ability to participate in decision making will improve and Compliance with prescribed medications will improve  Medication Management: RN will administer medications as ordered by provider, will assess and evaluate patient's response and provide education to patient for prescribed medication. RN will report any adverse and/or side effects to prescribing provider.  Therapeutic Interventions: 1 on 1 counseling sessions, Psychoeducation, Medication administration, Evaluate responses to treatment, Monitor vital signs and CBGs as ordered, Perform/monitor CIWA, COWS, AIMS and Fall Risk screenings as ordered, Perform wound care treatments as ordered.  Evaluation of Outcomes: Progressing    Recreational Therapy Treatment Plan for Primary Diagnosis: Schizoaffective disorder, bipolar type ([place]) Long Term Goal(s):  LTG- Patient will participate in recreation therapy tx in at least 2 group sessions without prompting from LRT.  Short Term Goals:  Patient will demonstrate increased ability to follow instructions, as demonstrated by ability to follow LRT instructions on first prompt during recreation therapy group sessions.  Treatment Modalities: Group and Pet Therapy  Therapeutic Interventions: Psychoeducation  Evaluation of Outcomes: Progressing   LCSW Treatment Plan for Primary Diagnosis: Schizoaffective disorder, bipolar type ([name]) Long Term Goal(s): Safe transition to appropriate next level of care at discharge, Engage patient in therapeutic group addressing interpersonal concerns.  Short Term Goals: Engage patient in aftercare planning with referrals and resources  Therapeutic Interventions: Assess for all discharge needs, 1 to 1 time with Social worker, Explore available resources and support systems, Assess for adequacy in community support network, Educate family and significant other(s) on suicide prevention, Complete Psychosocial Assessment, Interpersonal group therapy.  Evaluation of Outcomes: Not Met  Unable to engage patient in any meaningful conversation   Progress in Treatment: Attending  groups: Yes Participating in groups: Yes Taking medication as prescribed: Yes Toleration medication: Yes, no side effects reported at this time Family/Significant other contact made: No Patient understands diagnosis: No  Limited insight Discussing patient identified problems/goals with staff: Yes Medical problems stabilized or resolved: Yes Denies suicidal/homicidal ideation: Yes Issues/concerns per patient self-inventory: None Other: N/A  New problem(s) identified: None identified at this time.   New Short Term/Long Term Goal(s): None identified at this time.    Discharge Plan or Barriers: Unknown  Reason for Continuation of Hospitalization: Paranoia Disorganization Medication stabilization    Estimated Length of Stay: 3-5 days  Attendees: Patient: 12/24/2015  8:37 AM  Physician: Ursula Alert, MD 12/24/2015  8:37 AM  Nursing: Phillis Haggis, RN 12/24/2015  8:37 AM  RN Care Manager: Lars Pinks, RN 12/24/2015  8:37 AM  Social Worker: Ripley Fraise 12/24/2015  8:37 AM  Recreational Therapist: Laretta Bolster  12/24/2015  8:37 AM  Other: Norberto Sorenson 12/24/2015  8:37 AM  Other:  12/24/2015  8:37 AM    Scribe for Treatment Team:  Roque Lias 12/24/2015 8:37 AM

## 2015-12-24 NOTE — Progress Notes (Signed)
D: Pt refusing to talk to Probation officer. Pt said she was alright. Pt isolated to her room this evening A: Pt was offered support and encouragement.  Pt was encourage to attend groups. Q 15 minute checks were done for safety.  R:. Pt has no complaints. safety maintained on unit.

## 2015-12-24 NOTE — Progress Notes (Signed)
[facility] MD Progress Note  12/24/2015 5:32 PM [name] [name]  MRN:  [id number] Subjective: Patient continue to be nonverbal during this assessment.   Objective:[name] [name] seen resting in bed with eyes closed. Patient responds with head nodding only, patient affect is flat, depressed and guarded. Per staffing notes patient is refusing medications today. Support, encouragement and reassurance was provided.    Principal Problem: Schizoaffective disorder, bipolar type ([place]) Diagnosis:   Patient Active Problem List   Diagnosis Date Noted  . Insomnia [G47.00]   . Anxiety state [F41.1]   . Overactive bladder [N32.81]   . Diabetes mellitus ([place]) [E11.9] 02/08/2015  . Schizoaffective disorder, bipolar type ([place]) [F25.0] 01/28/2015  . Non compliance w medication regimen [Z91.14]    Total Time spent with patient: 25 minutes  Past Psychiatric History: Please see H&P.   Past Medical History:  Past Medical History:  Diagnosis Date  . Bipolar affective disorder, currently manic, mild ([place])   . Diabetes mellitus without complication ([place])   . Schizophrenia ([place])    History reviewed. No pertinent surgical history. Family History:  Family History  Problem Relation Age of Onset  . Drug abuse Maternal Uncle    Family Psychiatric  History: Please see H&P.  Social History: Please see H&P.  History  Alcohol Use No     History  Drug Use No    Social History   Social History  . Marital status: Legally Separated    Spouse name: N/A  . Number of children: N/A  . Years of education: N/A   Social History Main Topics  . Smoking status: Never Smoker  . Smokeless tobacco: Never Used  . Alcohol use No  . Drug use: No  . Sexual activity: Not Currently   Other Topics Concern  . None   Social History Narrative  . None   Additional Social History:    Pain Medications: See MAR  Prescriptions: See MAR Over the Counter: See MAR History of alcohol / drug use?: No history  of alcohol / drug abuse                    Sleep: observed as fair  Appetite:  Poor  Current Medications: Current Facility-Administered Medications  Medication Dose Route Frequency Provider Last Rate Last Dose  . acetaminophen (TYLENOL) tablet 650 mg  650 mg Oral Q6H PRN Benjamine Mola, FNP      . alum & mag hydroxide-simeth (MAALOX/MYLANTA) 200-200-20 MG/5ML suspension 30 mL  30 mL Oral Q4H PRN Benjamine Mola, FNP      . ARIPiprazole (ABILIFY) tablet 15 mg  15 mg Oral QHS Saramma Eappen, MD      . divalproex (DEPAKOTE ER) 24 hr tablet 1,000 mg  1,000 mg Oral QHS Benjamine Mola, FNP   1,000 mg at 12/22/15 2125  . feeding supplement (GLUCERNA SHAKE) (GLUCERNA SHAKE) liquid 237 mL  237 mL Oral TID BM Saramma Eappen, MD   237 mL at 12/24/15 1400  . hydrOXYzine (ATARAX/VISTARIL) tablet 25 mg  25 mg Oral TID PRN Benjamine Mola, FNP   25 mg at 12/22/15 2125  . insulin aspart (novoLOG) injection 0-15 Units  0-15 Units Subcutaneous TID WC Ursula Alert, MD   0 Units at 12/22/15 1704  . insulin aspart (novoLOG) injection 0-5 Units  0-5 Units Subcutaneous QHS Ursula Alert, MD   Stopped at 12/23/15 2203  . LORazepam (ATIVAN) tablet 1 mg  1 mg Oral Q6H PRN Ursula Alert, MD  Or  . LORazepam (ATIVAN) injection 1 mg  1 mg Intramuscular Q6H PRN Saramma Eappen, MD      . magnesium hydroxide (MILK OF MAGNESIA) suspension 30 mL  30 mL Oral Daily PRN Benjamine Mola, FNP      . metFORMIN (GLUCOPHAGE) tablet 500 mg  500 mg Oral Q breakfast Benjamine Mola, FNP   500 mg at 12/23/15 0846  . mirtazapine (REMERON) tablet 7.5 mg  7.5 mg Oral QHS Ursula Alert, MD   7.5 mg at 12/22/15 2125  . OLANZapine (ZYPREXA) tablet 10 mg  10 mg Oral TID PRN Ursula Alert, MD       Or  . OLANZapine (ZYPREXA) injection 10 mg  10 mg Intramuscular TID PRN Ursula Alert, MD      . oxybutynin (DITROPAN-XL) 24 hr tablet 10 mg  10 mg Oral QHS Benjamine Mola, FNP   10 mg at 12/22/15 2125    Lab Results:  Results  for orders placed or performed during the hospital encounter of 12/21/15 (from the past 48 hour(s))  Glucose, capillary     Status: None   Collection Time: 12/24/15 11:57 AM  Result Value Ref Range   Glucose-Capillary 90 65 - 99 mg/dL   Comment 1 Notify RN    Comment 2 Document in Chart   Glucose, capillary     Status: Abnormal   Collection Time: 12/24/15  5:15 PM  Result Value Ref Range   Glucose-Capillary 105 (H) 65 - 99 mg/dL   Comment 1 Notify RN    Comment 2 Document in Chart     Blood Alcohol level:  Lab Results  Component Value Date   ETH <5 12/20/2015   ETH <5 123XX123    Metabolic Disorder Labs: Lab Results  Component Value Date   HGBA1C 5.6 07/25/2015   MPG 114 07/25/2015   MPG 137 02/01/2015   Lab Results  Component Value Date   PROLACTIN 46.5 (H) 07/25/2015   PROLACTIN 21.6 02/01/2015   Lab Results  Component Value Date   CHOL 135 07/25/2015   TRIG 59 07/25/2015   HDL 50 07/25/2015   CHOLHDL 2.7 07/25/2015   VLDL 12 07/25/2015   LDLCALC 73 07/25/2015   LDLCALC 78 02/01/2015    Physical Findings: AIMS: Facial and Oral Movements Muscles of Facial Expression: None, normal Lips and Perioral Area: None, normal Jaw: None, normal Tongue: None, normal,Extremity Movements Upper (arms, wrists, hands, fingers): None, normal Lower (legs, knees, ankles, toes): None, normal, Trunk Movements Neck, shoulders, hips: None, normal, Overall Severity Severity of abnormal movements (highest score from questions above): None, normal Incapacitation due to abnormal movements: None, normal Patient's awareness of abnormal movements (rate only patient's report): No Awareness, Dental Status Current problems with teeth and/or dentures?: No Does patient usually wear dentures?: No  CIWA:    COWS:     Musculoskeletal: Strength & Muscle Tone: within normal limits Gait & Station: normal Patient leans: N/A  Psychiatric Specialty Exam: Physical Exam  Nursing note and  vitals reviewed. Constitutional: She appears well-developed.  Cardiovascular: Normal rate.   Psychiatric: She has a normal mood and affect. Her behavior is normal.    Review of Systems  Unable to perform ROS: Mental acuity  Psychiatric/Behavioral: Positive for depression.    Blood pressure 136/79, pulse (!) 131, temperature 99.1 F (37.3 C), temperature source Oral, resp. rate 18, height 5\' 4"  (1.626 m), weight 68 kg (150 lb), SpO2 100 %.Body mass index is 25.75 kg/m.  General Appearance:  Guarded  Eye Contact:  None  Speech:  UTA  Volume:  patient didnt respond to questions  Mood:  does not respond  Affect:  Depressed  Thought Process:  Irrelevant and Descriptions of Associations: Tangential  Orientation:  Other:  situation, place, person  Thought Content:  Delusions and Paranoid Ideation  Suicidal Thoughts:  does not respond  Homicidal Thoughts:  does not respond  Memory:  Immediate;   Fair Recent;   Poor Remote;   Poor  Judgement:  Poor  Insight:  Shallow  Psychomotor Activity:  Decreased  Concentration:  Concentration: Poor and Attention Span: Poor  Recall:  Poor  Fund of Knowledge:  Poor  Language:  Fair  Akathisia:  No  Handed:  Right  AIMS (if indicated):     Assets:  Others:  access to healthcare  ADL's:  Impaired  Cognition:  WNL  Sleep:  Number of Hours: 6.75     I agree with current treatment plan on 12/24/2015, Patient seen face-to-face for psychiatric evaluation follow-up, chart reviewed. Reviewed the information documented and agree with the treatment plan.  Treatment Plan Summary: Schizoaffective disorder, bipolar type ([place]) unstable  Daily contact with patient to assess and evaluate symptoms and progress in treatment and Medication management For Psychosis: Will contiune Abilify to 15 mg po qhs.   For Mood lability: Depakote ER 1000 mg po at bedtime. Depakote level on 12/25/15.   For anxiety/agitation: PRN medications as per agitation  protocol.  For insomnia: Remeron 7.5 mg po qhs.  For Diabetes: Restart home medications. SSI /CBG as per unit protocol. Hba1c-ordered.  Reviewed past medical records,treatment plan.  Will continue to monitor vitals ,medication compliance and treatment side effects while patient is here.  Will monitor for medical issues as well as call consult as needed.  Reviewed labs- pt refused all labs ordered this AM . CSW will continue working on disposition.  Patient to participate in therapeutic milieu   Derrill Center, NP 12/24/2015, 5:32 PM   Agree with NP note as above  Neita Garnet, MD

## 2015-12-24 NOTE — Progress Notes (Signed)
Refused AM CBG 

## 2015-12-24 NOTE — Plan of Care (Signed)
Problem: Safety: Goal: Periods of time without injury will increase Outcome: Progressing Pt safe on the unit at this time   

## 2015-12-25 LAB — GLUCOSE, CAPILLARY
GLUCOSE-CAPILLARY: 73 mg/dL (ref 65–99)
GLUCOSE-CAPILLARY: 128 mg/dL — AB (ref 65–99)
Glucose-Capillary: 107 mg/dL — ABNORMAL HIGH (ref 65–99)

## 2015-12-25 LAB — VALPROIC ACID LEVEL: Valproic Acid Lvl: 56 ug/mL (ref 50.0–100.0)

## 2015-12-25 MED ORDER — DIVALPROEX SODIUM 500 MG PO DR TAB
500.0000 mg | DELAYED_RELEASE_TABLET | Freq: Two times a day (BID) | ORAL | Status: DC
  Administered 2015-12-25 – 2016-01-03 (×18): 500 mg via ORAL
  Filled 2015-12-25 (×22): qty 1

## 2015-12-25 MED ORDER — ARIPIPRAZOLE 15 MG PO TABS
15.0000 mg | ORAL_TABLET | Freq: Every day | ORAL | Status: DC
  Administered 2015-12-25 – 2015-12-31 (×7): 15 mg via ORAL
  Filled 2015-12-25 (×9): qty 1

## 2015-12-25 NOTE — Progress Notes (Signed)
Recreation Therapy Notes  12/25/15 1048:  LRT went to talk to patient to complete Recreation Therapy assessment.  LRT introduced self to pt.  Pt was lying in bed with her eyes open.  Pt wouldn't look at LRT or answer questions.  LRT will follow up with pt.   12/25/15 1240:  LRT again went to talk to patient to do assessment.  Pt was sitting on the side of her bed.  LRT introduced self and asked if we could do the assessment, pt stated "no".      Victorino Sparrow A 12/25/2015 12:36 PM

## 2015-12-25 NOTE — Progress Notes (Signed)
Patient ID: [name] [name], female   DOB: [birth year], 35 y.o.   MRN: [id number] PER STATE REGULATIONS 482.30  THIS CHART WAS REVIEWED FOR MEDICAL NECESSITY WITH RESPECT TO THE PATIENT'S ADMISSION/ DURATION OF STAY.  NEXT REVIEW DATE: 12/29/2015  Chauncy Lean, RN, BSN CASE MANAGER

## 2015-12-25 NOTE — Progress Notes (Signed)
PATIENT refusing ekg at this time

## 2015-12-25 NOTE — Progress Notes (Signed)
[facility] MD Progress Note  12/25/2015 12:58 PM Mahathi Wiechman  MRN:  [id number] Subjective:Pt seen as partially mute - answers to questions in monosyllables at times.    Objective:Dawnna Murray-Taylor is seen as withdrawn , isolative , was observed as sleeping initially . But after several attempts to talk to patient - pt was able to get up and cooperate with evaluation partially. Pt today seen as tearful , dysphoric - does not discuss why. Pt states she feels sad - but unable to elaborate further. Per staff - pt often noted in bed , does not participate in milieu , her appetite is reduced and she has been refusing to eat often. Pt also refused to take her abilify and depakote since Sunday .  This was addressed with patient - her dosing schedule was changed this AM , and patient was able to take her medication PO this AM. Provided medication education and continue to support and treat.    Principal Problem: Schizoaffective disorder, bipolar type ([place]) Diagnosis:   Patient Active Problem List   Diagnosis Date Noted  . Insomnia [G47.00]   . Anxiety state [F41.1]   . Overactive bladder [N32.81]   . Diabetes mellitus ([place]) [E11.9] 02/08/2015  . Schizoaffective disorder, bipolar type ([place]) [F25.0] 01/28/2015  . Non compliance w medication regimen [Z91.14]    Total Time spent with patient: 30 minutes  Past Psychiatric History: Please see H&P.   Past Medical History:  Past Medical History:  Diagnosis Date  . Bipolar affective disorder, currently manic, mild ([place])   . Diabetes mellitus without complication ([place])   . Schizophrenia ([place])    History reviewed. No pertinent surgical history. Family History:  Family History  Problem Relation Age of Onset  . Drug abuse Maternal Uncle    Family Psychiatric  History: Please see H&P.  Social History: Please see H&P.  History  Alcohol Use No     History  Drug Use No    Social History   Social History  . Marital status:  Legally Separated    Spouse name: N/A  . Number of children: N/A  . Years of education: N/A   Social History Main Topics  . Smoking status: Never Smoker  . Smokeless tobacco: Never Used  . Alcohol use No  . Drug use: No  . Sexual activity: Not Currently   Other Topics Concern  . None   Social History Narrative  . None   Additional Social History:    Pain Medications: See MAR  Prescriptions: See MAR Over the Counter: See MAR History of alcohol / drug use?: No history of alcohol / drug abuse                    Sleep: observed as fair  Appetite:  Poor  Current Medications: Current Facility-Administered Medications  Medication Dose Route Frequency Provider Last Rate Last Dose  . acetaminophen (TYLENOL) tablet 650 mg  650 mg Oral Q6H PRN Benjamine Mola, FNP      . alum & mag hydroxide-simeth (MAALOX/MYLANTA) 200-200-20 MG/5ML suspension 30 mL  30 mL Oral Q4H PRN Benjamine Mola, FNP      . ARIPiprazole (ABILIFY) tablet 15 mg  15 mg Oral Daily Ursula Alert, MD   15 mg at 12/25/15 0953  . divalproex (DEPAKOTE) DR tablet 500 mg  500 mg Oral Q12H Ursula Alert, MD   500 mg at 12/25/15 0953  . feeding supplement (GLUCERNA SHAKE) (GLUCERNA SHAKE) liquid 237 mL  237 mL  Oral TID BM Ursula Alert, MD   237 mL at 12/25/15 0954  . hydrOXYzine (ATARAX/VISTARIL) tablet 25 mg  25 mg Oral TID PRN Benjamine Mola, FNP   25 mg at 12/22/15 2125  . insulin aspart (novoLOG) injection 0-15 Units  0-15 Units Subcutaneous TID WC Ursula Alert, MD   0 Units at 12/22/15 1704  . insulin aspart (novoLOG) injection 0-5 Units  0-5 Units Subcutaneous QHS Ursula Alert, MD   Stopped at 12/23/15 2203  . LORazepam (ATIVAN) tablet 1 mg  1 mg Oral Q6H PRN Ursula Alert, MD       Or  . LORazepam (ATIVAN) injection 1 mg  1 mg Intramuscular Q6H PRN Giancarlo Askren, MD      . magnesium hydroxide (MILK OF MAGNESIA) suspension 30 mL  30 mL Oral Daily PRN Benjamine Mola, FNP      . metFORMIN (GLUCOPHAGE)  tablet 500 mg  500 mg Oral Q breakfast Benjamine Mola, FNP   500 mg at 12/23/15 0846  . mirtazapine (REMERON) tablet 7.5 mg  7.5 mg Oral QHS Ursula Alert, MD   7.5 mg at 12/22/15 2125  . OLANZapine (ZYPREXA) tablet 10 mg  10 mg Oral TID PRN Ursula Alert, MD       Or  . OLANZapine (ZYPREXA) injection 10 mg  10 mg Intramuscular TID PRN Ursula Alert, MD      . oxybutynin (DITROPAN-XL) 24 hr tablet 10 mg  10 mg Oral QHS Benjamine Mola, FNP   10 mg at 12/22/15 2125    Lab Results:  Results for orders placed or performed during the hospital encounter of 12/21/15 (from the past 48 hour(s))  Glucose, capillary     Status: None   Collection Time: 12/24/15 11:57 AM  Result Value Ref Range   Glucose-Capillary 90 65 - 99 mg/dL   Comment 1 Notify RN    Comment 2 Document in Chart   Glucose, capillary     Status: Abnormal   Collection Time: 12/24/15  5:15 PM  Result Value Ref Range   Glucose-Capillary 105 (H) 65 - 99 mg/dL   Comment 1 Notify RN    Comment 2 Document in Chart   Glucose, capillary     Status: None   Collection Time: 12/25/15  6:29 AM  Result Value Ref Range   Glucose-Capillary 73 65 - 99 mg/dL   Comment 1 Notify RN   Valproic acid level     Status: None   Collection Time: 12/25/15  6:38 AM  Result Value Ref Range   Valproic Acid Lvl 56 50.0 - 100.0 ug/mL    Comment: Performed at [facility]  Glucose, capillary     Status: Abnormal   Collection Time: 12/25/15 12:13 PM  Result Value Ref Range   Glucose-Capillary 107 (H) 65 - 99 mg/dL   Comment 1 Notify RN    Comment 2 Document in Chart     Blood Alcohol level:  Lab Results  Component Value Date   ETH <5 12/20/2015   ETH <5 123XX123    Metabolic Disorder Labs: Lab Results  Component Value Date   HGBA1C 5.6 07/25/2015   MPG 114 07/25/2015   MPG 137 02/01/2015   Lab Results  Component Value Date   PROLACTIN 46.5 (H) 07/25/2015   PROLACTIN 21.6 02/01/2015   Lab Results  Component Value  Date   CHOL 135 07/25/2015   TRIG 59 07/25/2015   HDL 50 07/25/2015   CHOLHDL 2.7  07/25/2015   VLDL 12 07/25/2015   LDLCALC 73 07/25/2015   LDLCALC 78 02/01/2015    Physical Findings: AIMS: Facial and Oral Movements Muscles of Facial Expression: None, normal Lips and Perioral Area: None, normal Jaw: None, normal Tongue: None, normal,Extremity Movements Upper (arms, wrists, hands, fingers): None, normal Lower (legs, knees, ankles, toes): None, normal, Trunk Movements Neck, shoulders, hips: None, normal, Overall Severity Severity of abnormal movements (highest score from questions above): None, normal Incapacitation due to abnormal movements: None, normal Patient's awareness of abnormal movements (rate only patient's report): No Awareness, Dental Status Current problems with teeth and/or dentures?: No Does patient usually wear dentures?: No  CIWA:    COWS:     Musculoskeletal: Strength & Muscle Tone: within normal limits Gait & Station: normal Patient leans: N/A  Psychiatric Specialty Exam: Physical Exam  Nursing note and vitals reviewed.   Review of Systems  Unable to perform ROS: Mental acuity    Blood pressure 136/79, pulse (!) 131, temperature 99.1 F (37.3 C), temperature source Oral, resp. rate 18, height 5\' 4"  (1.626 m), weight 68 kg (150 lb), SpO2 100 %.Body mass index is 25.75 kg/m.  General Appearance: Guarded  Eye Contact:  None  Speech:  Slow is often mute  Volume:  Decreased  Mood:  does not respond  Affect:  Depressed and Tearful  Thought Process:  Linear and Descriptions of Associations: Tangential  Orientation:  Other:  situation, place, person  Thought Content:  Delusions and Paranoid Ideation  Suicidal Thoughts:  does not respond  Homicidal Thoughts:  does not respond  Memory:  Immediate;   Fair Recent;   Poor Remote;   Poor  Judgement:  Poor  Insight:  Shallow  Psychomotor Activity:  Decreased  Concentration:  Concentration: Poor and  Attention Span: Poor  Recall:  Poor  Fund of Knowledge:  Poor  Language:  Fair  Akathisia:  No  Handed:  Right  AIMS (if indicated):     Assets:  Others:  access to healthcare  ADL's:  Impaired  Cognition:  WNL  Sleep:  Number of Hours: 6.25      Treatment Plan Summary: Schizoaffective disorder, bipolar type ([place]) unstable  Will continue today 12/25/15 plan as below except where it is noted.   Daily contact with patient to assess and evaluate symptoms and progress in treatment and Medication management For Psychosis: Will continue Abilify 15 mg po - change dosing time to AM. Pt refused past two doses - but took it this AM .   For Mood lability: Will change Depakote to 500 mg po bid. Pt refused it previous doses - but was able to take it this AM. Depakote level on 12/25/15- 56 ( therapeutic)   For anxiety/agitation: PRN medications as per agitation protocol.  For insomnia: Remeron 7.5 mg po qhs.  For Diabetes: Restarted  home medications. SSI /CBG as per unit protocol. Hba1c-ordered.  Reviewed past medical records,treatment plan.   Will continue to monitor vitals ,medication compliance and treatment side effects while patient is here.   Will monitor for medical issues as well as call consult as needed.   Reviewed labs- HBA1C, PL.  CSW will continue working on disposition.   Patient to participate in therapeutic milieu   Lamari Beckles, MD 12/25/2015, 12:58 PM

## 2015-12-25 NOTE — Progress Notes (Signed)
D: Patient denies SI/HI ; patient reports sleep to be " okay"; patient was asked if she was having auditory and visual hallucinations and the patient replied " that cant be treated"   A: Monitored q 15 minutes; patient encouraged to attend groups; patient educated about medications; patient given medications per physician orders; patient encouraged to express feelings and/or concerns  R: Patient is cooperative; patient is laying in the bed; and responds slowly but is speaking and has taken her medication and ate about 25% of her food after being encouraged; patient isolates to her room

## 2015-12-25 NOTE — BHH Group Notes (Signed)
[facility] LCSW Group Therapy  12/25/2015 , 12:00 PM   Type of Therapy:  Group Therapy  Participation Level:  Active  Participation Quality:  Attentive  Affect:  Appropriate  Cognitive:  Alert  Insight:  Improving  Engagement in Therapy:  Engaged  Modes of Intervention:  Discussion, Exploration and Socialization  Summary of Progress/Problems: Today's group focused on the term Diagnosis.  Participants were asked to define the term, and then pronounce whether it is a negative, positive or neutral term. Invited.  Chose to not attend.  Roque Lias B 12/25/2015 , 12:00 PM

## 2015-12-25 NOTE — Progress Notes (Signed)
Recreation Therapy Notes  Date: 12/25/15 Time: 1000 Location: 500 Hall Dayroom  Group Topic: Leisure Education  Goal Area(s) Addresses:  Patient will identify positive leisure activities.  Patient will identify one positive benefit of participation in leisure activities.   Intervention: Architect paper, markers, scissors, glue sticks, magazines  Activity: Leisure Programmer, systems.  Patients were to create a brochure that showed the various aspects of leisure.  The brochure could include activities from reading to traveling the world.  Patients were to highlight activities that interest them and activities the would like to learn to do or try.  Education: Leisure Education, Dentist  Education Outcome: Acknowledges education/In group clarification offered/Needs additional education  Clinical Observations/Feedback: Pt did not attend group.   Victorino Sparrow, LRT/CTRS        Victorino Sparrow A 12/25/2015 12:09 PM

## 2015-12-26 DIAGNOSIS — Z813 Family history of other psychoactive substance abuse and dependence: Secondary | ICD-10-CM

## 2015-12-26 DIAGNOSIS — F250 Schizoaffective disorder, bipolar type: Principal | ICD-10-CM

## 2015-12-26 DIAGNOSIS — N3281 Overactive bladder: Secondary | ICD-10-CM

## 2015-12-26 DIAGNOSIS — Z794 Long term (current) use of insulin: Secondary | ICD-10-CM

## 2015-12-26 DIAGNOSIS — F411 Generalized anxiety disorder: Secondary | ICD-10-CM

## 2015-12-26 DIAGNOSIS — G4700 Insomnia, unspecified: Secondary | ICD-10-CM

## 2015-12-26 DIAGNOSIS — Z79899 Other long term (current) drug therapy: Secondary | ICD-10-CM

## 2015-12-26 LAB — GLUCOSE, CAPILLARY: GLUCOSE-CAPILLARY: 112 mg/dL — AB (ref 65–99)

## 2015-12-26 NOTE — Progress Notes (Signed)
D: Pt denies SI/HI/AVH. Pt is pleasant, paranoid, guarded, and only answers yes/ no to  Questions. Pt stated she did not feel safe in here, but stated she thought she needed to start taking her medications.   A: Pt was offered support and encouragement.  Pt was encourage to attend groups. Q 15 minute checks were done for safety.   R: safety maintained on unit. Pt took her medications this evening.

## 2015-12-26 NOTE — Progress Notes (Signed)
DAR NOTE: Patient remained paranoid, guarded and suspicious.  Patient remained withdrawn and isolative to her room.  Affect is flat with depressed mood.  Patient reports that staff are out to get her.  Denies pain, auditory and visual hallucinations.  Maintained on routine safety checks.  Medications given as prescribed.  Support and encouragement offered as needed.    Minimal interaction with staff.  Made poor eye contact during assessment.

## 2015-12-26 NOTE — BHH Group Notes (Signed)
[facility] Mental Health Association Group Therapy  12/26/2015 , 1:43 PM    Type of Therapy:  Mental Health Association Presentation  Participation Level:  Active  Participation Quality:  Attentive  Affect:  Blunted  Cognitive:  Oriented  Insight:  Limited  Engagement in Therapy:  Engaged  Modes of Intervention:  Discussion, Education and Socialization  Summary of Progress/Problems:  Shanon Brow from [facility] came to present his recovery story and play the guitar.  In bed, awake, engageable.  Goal directed.  "Do you think my old pastor will let me stay with her again?  Did they take away my disability?  Someone told me I was bankrupt."  Declined to join [place] in group.  Roque Lias B 12/26/2015 , 1:43 PM

## 2015-12-26 NOTE — Progress Notes (Signed)
[facility] MD Progress Note  12/26/2015 4:02 PM [name] [name]  MRN:  [id number]  Subjective: [name] [name] is talkative today. She states, "I still need to know why I'm in this place. I feel like someone is going to kill me or hurt me. I feel like something bad is going to happen to me here. I need to leave, go home".   Objective: [name] [name] is seen, chart reviewed. She is alert, oriented to self. Presents as delusional, paranoid & suspicious. She says she feels the staff at the [facility] is out to get her, charge of something & take her to court. She states also that her step-daughter is plotting tp kill her to collect some insurance benefit. She is not making any eye contact. Per staff - pt often noted in bed, does not participate in milieu, her appetite is reduced and she has been refusing to eat often. Pt also refused to take her abilify and depakote since Sunday. Her medication dosing time has been changed to meet her need. She remains paranoid. She does however appear to be in no apparent distress.  Principal Problem: Schizoaffective disorder, bipolar type ([place]) Diagnosis:   Patient Active Problem List   Diagnosis Date Noted  . Insomnia [G47.00]   . Anxiety state [F41.1]   . Overactive bladder [N32.81]   . Diabetes mellitus ([place]) [E11.9] 02/08/2015  . Schizoaffective disorder, bipolar type ([place]) [F25.0] 01/28/2015  . Non compliance w medication regimen [Z91.14]    Total Time spent with patient: 15 minutes  Past Psychiatric History: Please see H&P.  Past Medical History:  Past Medical History:  Diagnosis Date  . Bipolar affective disorder, currently manic, mild ([place])   . Diabetes mellitus without complication ([place])   . Schizophrenia ([place])    History reviewed. No pertinent surgical history. Family History:  Family History  Problem Relation Age of Onset  . Drug abuse Maternal Uncle    Family Psychiatric  History: Please see H&P.  Social History: Please see H&P.  History  Alcohol Use No      History  Drug Use No    Social History   Social History  . Marital status: Legally Separated    Spouse name: N/A  . Number of children: N/A  . Years of education: N/A   Social History Main Topics  . Smoking status: Never Smoker  . Smokeless tobacco: Never Used  . Alcohol use No  . Drug use: No  . Sexual activity: Not Currently   Other Topics Concern  . None   Social History Narrative  . None   Additional Social History:    Pain Medications: See MAR  Prescriptions: See MAR Over the Counter: See MAR History of alcohol / drug use?: No history of alcohol / drug abuse  Sleep: observed as fair  Appetite:  Poor  Current Medications: Current Facility-Administered Medications  Medication Dose Route Frequency Provider Last Rate Last Dose  . acetaminophen (TYLENOL) tablet 650 mg  650 mg Oral Q6H PRN Benjamine Mola, FNP      . alum & mag hydroxide-simeth (MAALOX/MYLANTA) 200-200-20 MG/5ML suspension 30 mL  30 mL Oral Q4H PRN Benjamine Mola, FNP      . ARIPiprazole (ABILIFY) tablet 15 mg  15 mg Oral Daily Ursula Alert, MD   15 mg at 12/26/15 1222  . divalproex (DEPAKOTE) DR tablet 500 mg  500 mg Oral Q12H Saramma Eappen, MD   500 mg at 12/26/15 1222  . feeding supplement (GLUCERNA SHAKE) (GLUCERNA SHAKE) liquid 237  mL  237 mL Oral TID BM Ursula Alert, MD   237 mL at 12/25/15 0954  . hydrOXYzine (ATARAX/VISTARIL) tablet 25 mg  25 mg Oral TID PRN Benjamine Mola, FNP   25 mg at 12/22/15 2125  . insulin aspart (novoLOG) injection 0-15 Units  0-15 Units Subcutaneous TID WC Ursula Alert, MD   0 Units at 12/26/15 1225  . insulin aspart (novoLOG) injection 0-5 Units  0-5 Units Subcutaneous QHS Ursula Alert, MD   Stopped at 12/23/15 2203  . LORazepam (ATIVAN) tablet 1 mg  1 mg Oral Q6H PRN Ursula Alert, MD       Or  . LORazepam (ATIVAN) injection 1 mg  1 mg Intramuscular Q6H PRN Saramma Eappen, MD      . magnesium hydroxide (MILK OF MAGNESIA) suspension 30 mL  30 mL Oral  Daily PRN Benjamine Mola, FNP      . metFORMIN (GLUCOPHAGE) tablet 500 mg  500 mg Oral Q breakfast Benjamine Mola, FNP   500 mg at 12/26/15 1222  . mirtazapine (REMERON) tablet 7.5 mg  7.5 mg Oral QHS Ursula Alert, MD   7.5 mg at 12/22/15 2125  . OLANZapine (ZYPREXA) tablet 10 mg  10 mg Oral TID PRN Ursula Alert, MD       Or  . OLANZapine (ZYPREXA) injection 10 mg  10 mg Intramuscular TID PRN Ursula Alert, MD      . oxybutynin (DITROPAN-XL) 24 hr tablet 10 mg  10 mg Oral QHS Benjamine Mola, FNP   10 mg at 12/22/15 2125   Lab Results:  Results for orders placed or performed during the hospital encounter of 12/21/15 (from the past 48 hour(s))  Glucose, capillary     Status: Abnormal   Collection Time: 12/24/15  5:15 PM  Result Value Ref Range   Glucose-Capillary 105 (H) 65 - 99 mg/dL   Comment 1 Notify RN    Comment 2 Document in Chart   Glucose, capillary     Status: None   Collection Time: 12/25/15  6:29 AM  Result Value Ref Range   Glucose-Capillary 73 65 - 99 mg/dL   Comment 1 Notify RN   Valproic acid level     Status: None   Collection Time: 12/25/15  6:38 AM  Result Value Ref Range   Valproic Acid Lvl 56 50.0 - 100.0 ug/mL    Comment: Performed at [facility]  Glucose, capillary     Status: Abnormal   Collection Time: 12/25/15 12:13 PM  Result Value Ref Range   Glucose-Capillary 107 (H) 65 - 99 mg/dL   Comment 1 Notify RN    Comment 2 Document in Chart   Glucose, capillary     Status: Abnormal   Collection Time: 12/25/15  5:18 PM  Result Value Ref Range   Glucose-Capillary 128 (H) 65 - 99 mg/dL   Comment 1 Notify RN    Comment 2 Document in Chart   Glucose, capillary     Status: Abnormal   Collection Time: 12/26/15 12:24 PM  Result Value Ref Range   Glucose-Capillary 112 (H) 65 - 99 mg/dL   Blood Alcohol level:  Lab Results  Component Value Date   ETH <5 12/20/2015   ETH <5 123XX123    Metabolic Disorder Labs: Lab Results  Component  Value Date   HGBA1C 5.6 07/25/2015   MPG 114 07/25/2015   MPG 137 02/01/2015   Lab Results  Component Value Date   PROLACTIN 46.5 (  H) 07/25/2015   PROLACTIN 21.6 02/01/2015   Lab Results  Component Value Date   CHOL 135 07/25/2015   TRIG 59 07/25/2015   HDL 50 07/25/2015   CHOLHDL 2.7 07/25/2015   VLDL 12 07/25/2015   LDLCALC 73 07/25/2015   LDLCALC 78 02/01/2015   Physical Findings: AIMS: Facial and Oral Movements Muscles of Facial Expression: None, normal Lips and Perioral Area: None, normal Jaw: None, normal Tongue: None, normal,Extremity Movements Upper (arms, wrists, hands, fingers): None, normal Lower (legs, knees, ankles, toes): None, normal, Trunk Movements Neck, shoulders, hips: None, normal, Overall Severity Severity of abnormal movements (highest score from questions above): None, normal Incapacitation due to abnormal movements: None, normal Patient's awareness of abnormal movements (rate only patient's report): No Awareness, Dental Status Current problems with teeth and/or dentures?: No Does patient usually wear dentures?: No  CIWA:    COWS:     Musculoskeletal: Strength & Muscle Tone: within normal limits Gait & Station: normal Patient leans: N/A  Psychiatric Specialty Exam: Physical Exam  Nursing note and vitals reviewed.   Review of Systems  Unable to perform ROS: Mental acuity    Blood pressure 136/79, pulse (!) 131, temperature 99.1 F (37.3 C), temperature source Oral, resp. rate 18, height 5\' 4"  (1.626 m), weight 68 kg (150 lb), SpO2 100 %.Body mass index is 25.75 kg/m.  General Appearance: Guarded  Eye Contact:  None  Speech:  Slow is often mute  Volume:  Decreased  Mood:  does not respond  Affect:  Depressed and Tearful  Thought Process:  Linear and Descriptions of Associations: Tangential  Orientation:  Other:  situation, place, person  Thought Content:  Delusions and Paranoid Ideation  Suicidal Thoughts:  does not respond  Homicidal  Thoughts:  does not respond  Memory:  Immediate;   Fair Recent;   Poor Remote;   Poor  Judgement:  Poor  Insight:  Shallow  Psychomotor Activity:  Decreased  Concentration:  Concentration: Poor and Attention Span: Poor  Recall:  Poor  Fund of Knowledge:  Poor  Language:  Fair  Akathisia:  No  Handed:  Right  AIMS (if indicated):     Assets:  Others:  access to healthcare  ADL's:  Impaired  Cognition:  WNL  Sleep:  Number of Hours: 6   Treatment Plan Summary: Schizoaffective disorder, bipolar type ([place]) unstable  Will continue today 12/26/15 plan as below except where it is noted.  Daily contact with patient to assess and evaluate symptoms and progress in treatment and Medication management For Psychosis: Will continue Abilify 15 mg po - change dosing time to AM. Pt refused past two doses - but took it this AM .   For Mood lability: Will change Depakote to 500 mg po bid. Pt refused it previous doses - but was able to take it this AM. Depakote level on 12/25/15- 56 ( therapeutic)   For anxiety/agitation: PRN medications as per agitation protocol.  For insomnia: Remeron 7.5 mg po qhs.  For Diabetes: Restarted  home medications. SSI /CBG as per unit protocol. Hba1c-ordered.  Reviewed past medical records,treatment plan.   Will continue to monitor vitals ,medication compliance and treatment side effects while patient is here.   Will monitor for medical issues as well as call consult as needed.   Reviewed labs- HBA1C, PL.  CSW will continue working on disposition.   Patient to participate in therapeutic milieu. No changes made on this current plan of care. Continue treatments as recommended.  [place],  Loleta Dicker, NP, [place], FNP 12/26/2015, 4:02 PMPatient ID: [name] [name], female   DOB: 15-Oct-1980, 35 y.o.   MRN: [id number] Agree with NP note as above  Neita Garnet, MD

## 2015-12-26 NOTE — Progress Notes (Signed)
Pt did not attend wrap up group meeting this evening.  

## 2015-12-26 NOTE — Progress Notes (Signed)
Recreation Therapy Notes  Date: 12/26/15 Time: 1000 Location: 500 Hall Dayroom  Group Topic: Wellness  Goal Area(s) Addresses:  Patient will define components of whole wellness. Patient will verbalize benefit of whole wellness.  Intervention:  Tax adviser, beach ball  Activity: Seated Soccer.  LRT arranged the chairs facing the front of the room.  Patients were to try to get the ball past the LRT.  Patients were to work together to come up with a strategy on how to get the ball past the LRT.  Each time the ball got past the LRT, that was a point.  Education: Wellness, Dentist.   Education Outcome: Acknowledges education/In group clarification offered/Needs additional education.   Clinical Observations/Feedback: Pt did not attend group.   Victorino Sparrow, LRT/CTRS        Victorino Sparrow A 12/26/2015 12:29 PM

## 2015-12-26 NOTE — Progress Notes (Signed)
Pt refuses to talk to writer after numerous attempts to communicate and after many questions. Pt only said she did not want to talk to Probation officer. Pt safe on the unit at this time. Pt stayed in her room this evening.

## 2015-12-27 LAB — GLUCOSE, CAPILLARY
GLUCOSE-CAPILLARY: 102 mg/dL — AB (ref 65–99)
GLUCOSE-CAPILLARY: 100 mg/dL — AB (ref 65–99)
GLUCOSE-CAPILLARY: 144 mg/dL — AB (ref 65–99)

## 2015-12-27 NOTE — Progress Notes (Signed)
[facility] MD Progress Note  12/27/2015 1:30 PM [name] [name]  MRN:  [id number]  Subjective: [name] [name] is talkative today. She states, "I still need to know why I'm in this place. I feel like someone is going to kill me or hurt me. I feel like something bad is going to happen to me here. I need to leave, go home".   Objective: [name] [name] is seen, chart reviewed. She is alert, oriented to self. She remains delusional, paranoid & suspicious. She says today that there was letter under Dr. Charlcie Cradle office door directed to her. She also claimed that someone has removed the letter so that she will not read it. [name] is not making any eye contact. Per staff - pt often noted in bed, does not participate in milieu, her appetite is reduced and she has been refusing to eat often. She is making an effort to take her medications today. Her medication dosing time has been changed to meet her need. She remains paranoid. She does however appear to be in no apparent distress.  Principal Problem: Schizoaffective disorder, bipolar type ([place]) Diagnosis:   Patient Active Problem List   Diagnosis Date Noted  . Insomnia [G47.00]   . Anxiety state [F41.1]   . Overactive bladder [N32.81]   . Diabetes mellitus ([place]) [E11.9] 02/08/2015  . Schizoaffective disorder, bipolar type ([place]) [F25.0] 01/28/2015  . Non compliance w medication regimen [Z91.14]    Total Time spent with patient: 15 minutes  Past Psychiatric History: Please see H&P.  Past Medical History:  Past Medical History:  Diagnosis Date  . Bipolar affective disorder, currently manic, mild ([place])   . Diabetes mellitus without complication ([place])   . Schizophrenia ([place])    History reviewed. No pertinent surgical history. Family History:  Family History  Problem Relation Age of Onset  . Drug abuse Maternal Uncle    Family Psychiatric  History: Please see H&P.  Social History: Please see H&P.  History  Alcohol Use No     History  Drug Use No     Social History   Social History  . Marital status: Legally Separated    Spouse name: N/A  . Number of children: N/A  . Years of education: N/A   Social History Main Topics  . Smoking status: Never Smoker  . Smokeless tobacco: Never Used  . Alcohol use No  . Drug use: No  . Sexual activity: Not Currently   Other Topics Concern  . None   Social History Narrative  . None   Additional Social History:    Pain Medications: See MAR  Prescriptions: See MAR Over the Counter: See MAR History of alcohol / drug use?: No history of alcohol / drug abuse  Sleep: observed as fair  Appetite:  Poor  Current Medications: Current Facility-Administered Medications  Medication Dose Route Frequency Provider Last Rate Last Dose  . acetaminophen (TYLENOL) tablet 650 mg  650 mg Oral Q6H PRN Benjamine Mola, FNP      . alum & mag hydroxide-simeth (MAALOX/MYLANTA) 200-200-20 MG/5ML suspension 30 mL  30 mL Oral Q4H PRN Benjamine Mola, FNP      . ARIPiprazole (ABILIFY) tablet 15 mg  15 mg Oral Daily Ursula Alert, MD   15 mg at 12/27/15 0835  . divalproex (DEPAKOTE) DR tablet 500 mg  500 mg Oral Q12H Ursula Alert, MD   500 mg at 12/27/15 0835  . feeding supplement (GLUCERNA SHAKE) (GLUCERNA SHAKE) liquid 237 mL  237 mL Oral TID BM Saramma  Eappen, MD   237 mL at 12/27/15 1000  . hydrOXYzine (ATARAX/VISTARIL) tablet 25 mg  25 mg Oral TID PRN Benjamine Mola, FNP   25 mg at 12/22/15 2125  . insulin aspart (novoLOG) injection 0-15 Units  0-15 Units Subcutaneous TID WC Ursula Alert, MD   0 Units at 12/26/15 1225  . insulin aspart (novoLOG) injection 0-5 Units  0-5 Units Subcutaneous QHS Ursula Alert, MD   Stopped at 12/23/15 2203  . LORazepam (ATIVAN) tablet 1 mg  1 mg Oral Q6H PRN Ursula Alert, MD       Or  . LORazepam (ATIVAN) injection 1 mg  1 mg Intramuscular Q6H PRN Saramma Eappen, MD      . magnesium hydroxide (MILK OF MAGNESIA) suspension 30 mL  30 mL Oral Daily PRN Benjamine Mola, FNP       . metFORMIN (GLUCOPHAGE) tablet 500 mg  500 mg Oral Q breakfast Benjamine Mola, FNP   500 mg at 12/26/15 1222  . mirtazapine (REMERON) tablet 7.5 mg  7.5 mg Oral QHS Saramma Eappen, MD   7.5 mg at 12/26/15 2245  . OLANZapine (ZYPREXA) tablet 10 mg  10 mg Oral TID PRN Ursula Alert, MD       Or  . OLANZapine (ZYPREXA) injection 10 mg  10 mg Intramuscular TID PRN Ursula Alert, MD      . oxybutynin (DITROPAN-XL) 24 hr tablet 10 mg  10 mg Oral QHS Benjamine Mola, FNP   10 mg at 12/26/15 2243   Lab Results:  Results for orders placed or performed during the hospital encounter of 12/21/15 (from the past 48 hour(s))  Glucose, capillary     Status: Abnormal   Collection Time: 12/25/15  5:18 PM  Result Value Ref Range   Glucose-Capillary 128 (H) 65 - 99 mg/dL   Comment 1 Notify RN    Comment 2 Document in Chart   Glucose, capillary     Status: Abnormal   Collection Time: 12/26/15 12:24 PM  Result Value Ref Range   Glucose-Capillary 112 (H) 65 - 99 mg/dL  Glucose, capillary     Status: Abnormal   Collection Time: 12/27/15 10:24 AM  Result Value Ref Range   Glucose-Capillary 102 (H) 65 - 99 mg/dL  Glucose, capillary     Status: Abnormal   Collection Time: 12/27/15 12:14 PM  Result Value Ref Range   Glucose-Capillary 100 (H) 65 - 99 mg/dL   Blood Alcohol level:  Lab Results  Component Value Date   ETH <5 12/20/2015   ETH <5 [id number]    Metabolic Disorder Labs: Lab Results  Component Value Date   HGBA1C 5.6 07/25/2015   MPG 114 07/25/2015   MPG 137 02/01/2015   Lab Results  Component Value Date   PROLACTIN 46.5 (H) 07/25/2015   PROLACTIN 21.6 02/01/2015   Lab Results  Component Value Date   CHOL 135 07/25/2015   TRIG 59 07/25/2015   HDL 50 07/25/2015   CHOLHDL 2.7 07/25/2015   VLDL 12 07/25/2015   LDLCALC 73 07/25/2015   LDLCALC 78 02/01/2015   Physical Findings: AIMS: Facial and Oral Movements Muscles of Facial Expression: None, normal Lips and Perioral Area:  None, normal Jaw: None, normal Tongue: None, normal,Extremity Movements Upper (arms, wrists, hands, fingers): None, normal Lower (legs, knees, ankles, toes): None, normal, Trunk Movements Neck, shoulders, hips: None, normal, Overall Severity Severity of abnormal movements (highest score from questions above): None, normal Incapacitation due to abnormal movements: None, normal  Patient's awareness of abnormal movements (rate only patient's report): No Awareness, Dental Status Current problems with teeth and/or dentures?: No Does patient usually wear dentures?: No  CIWA:    COWS:     Musculoskeletal: Strength & Muscle Tone: within normal limits Gait & Station: normal Patient leans: N/A  Psychiatric Specialty Exam: Physical Exam  Nursing note and vitals reviewed.   Review of Systems  Unable to perform ROS: Mental acuity    Blood pressure 136/79, pulse (!) 131, temperature 99.1 F (37.3 C), temperature source Oral, resp. rate 18, height 5\' 4"  (1.626 m), weight 68 kg (150 lb), SpO2 100 %.Body mass index is 25.75 kg/m.  General Appearance: Guarded  Eye Contact:  None  Speech:  Slow is often mute  Volume:  Decreased  Mood:  does not respond  Affect:  Depressed and Tearful  Thought Process:  Linear and Descriptions of Associations: Tangential  Orientation:  Other:  situation, place, person  Thought Content:  Delusions and Paranoid Ideation  Suicidal Thoughts:  does not respond  Homicidal Thoughts:  does not respond  Memory:  Immediate;   Fair Recent;   Poor Remote;   Poor  Judgement:  Poor  Insight:  Shallow  Psychomotor Activity:  Decreased  Concentration:  Concentration: Poor and Attention Span: Poor  Recall:  Poor  Fund of Knowledge:  Poor  Language:  Fair  Akathisia:  No  Handed:  Right  AIMS (if indicated):     Assets:  Others:  access to healthcare  ADL's:  Impaired  Cognition:  WNL  Sleep:  Number of Hours: 2.25   Treatment Plan Summary: Schizoaffective  disorder, bipolar type ([place]) unstable  Will continue today 12/27/15 plan as below except where it is noted.  Daily contact with patient to assess and evaluate symptoms and progress in treatment and Medication management For Psychosis: Will continue Abilify 15 mg po - change dosing time to AM. Pt refused past two doses - but took it this AM .   For Mood lability: Will change Depakote to 500 mg po bid. Pt refused it previous doses - but was able to take it this AM. Depakote level on 12/25/15- 56 ( therapeutic)   For anxiety/agitation: PRN medications as per agitation protocol.  For insomnia: Remeron 7.5 mg po qhs.  For Diabetes: Restarted  home medications. SSI /CBG as per unit protocol. Hba1c-ordered.  Reviewed past medical records,treatment plan.   Will continue to monitor vitals ,medication compliance and treatment side effects while patient is here.   Will monitor for medical issues as well as call consult as needed.   Reviewed labs- HBA1C, PL.  CSW will continue working on disposition.   Patient to participate in therapeutic milieu. No changes made on this current plan of care. Continue treatments as recommended.  Encarnacion Slates, NP, [place], [place] 12/27/2015, 1:30 PMPatient ID: [name], female   DOB: [birth year], 35 y.o.   MRN: [id number] Patient ID: Hollynn Okafor, female   DOB: [birth year], 35 y.o.   MRN: [id number]  Agree with NP note as above  Neita Garnet, MD

## 2015-12-27 NOTE — Progress Notes (Signed)
D: Patient denies SI/HI and A/V hallucinations;patient reports that she slept fine; patient denies pain; patient reports that she did not eat her breakfast; patient originally refused her cbg check but allowed [place] to recheck it later  A: Monitored q 15 minutes; patient encouraged to attend groups; patient educated about medications; patient given medications per physician orders; patient encouraged to express feelings and/or concerns  R: Patient came to the nursing station and asked about her medications and wrote them all down; patient is logical and coherent during conversation; patient  Is taking all of her medications as prescribed; patient's interaction with staff and peers is still very minimal and she stays in her room

## 2015-12-27 NOTE — Tx Team (Signed)
Interdisciplinary Treatment and Diagnostic Plan Update  12/27/2015 Time of Session: 2:25 PM  [name] [name] MRN: [id number]  Principal Diagnosis: Schizoaffective disorder, bipolar type [facility])  Secondary Diagnoses: Principal Problem:   Schizoaffective disorder, bipolar type ([place]) Active Problems:   Non compliance w medication regimen   Diabetes mellitus ([place])   Current Medications:  Current Facility-Administered Medications  Medication Dose Route Frequency Provider Last Rate Last Dose  . acetaminophen (TYLENOL) tablet 650 mg  650 mg Oral Q6H PRN Benjamine Mola, FNP      . alum & mag hydroxide-simeth (MAALOX/MYLANTA) 200-200-20 MG/5ML suspension 30 mL  30 mL Oral Q4H PRN Benjamine Mola, FNP      . ARIPiprazole (ABILIFY) tablet 15 mg  15 mg Oral Daily Ursula Alert, MD   15 mg at 12/27/15 0835  . divalproex (DEPAKOTE) DR tablet 500 mg  500 mg Oral Q12H Ursula Alert, MD   500 mg at 12/27/15 0835  . feeding supplement (GLUCERNA SHAKE) (GLUCERNA SHAKE) liquid 237 mL  237 mL Oral TID BM Saramma Eappen, MD   237 mL at 12/27/15 1000  . hydrOXYzine (ATARAX/VISTARIL) tablet 25 mg  25 mg Oral TID PRN Benjamine Mola, FNP   25 mg at 12/22/15 2125  . insulin aspart (novoLOG) injection 0-15 Units  0-15 Units Subcutaneous TID WC Ursula Alert, MD   0 Units at 12/26/15 1225  . insulin aspart (novoLOG) injection 0-5 Units  0-5 Units Subcutaneous QHS Ursula Alert, MD   Stopped at 12/23/15 2203  . LORazepam (ATIVAN) tablet 1 mg  1 mg Oral Q6H PRN Ursula Alert, MD       Or  . LORazepam (ATIVAN) injection 1 mg  1 mg Intramuscular Q6H PRN Saramma Eappen, MD      . magnesium hydroxide (MILK OF MAGNESIA) suspension 30 mL  30 mL Oral Daily PRN Benjamine Mola, FNP      . metFORMIN (GLUCOPHAGE) tablet 500 mg  500 mg Oral Q breakfast Benjamine Mola, FNP   500 mg at 12/26/15 1222  . mirtazapine (REMERON) tablet 7.5 mg  7.5 mg Oral QHS Saramma Eappen, MD   7.5 mg at 12/26/15 2245  . OLANZapine  (ZYPREXA) tablet 10 mg  10 mg Oral TID PRN Ursula Alert, MD       Or  . OLANZapine (ZYPREXA) injection 10 mg  10 mg Intramuscular TID PRN Ursula Alert, MD      . oxybutynin (DITROPAN-XL) 24 hr tablet 10 mg  10 mg Oral QHS Benjamine Mola, FNP   10 mg at 12/26/15 2243    PTA Medications: Prescriptions Prior to Admission  Medication Sig Dispense Refill Last Dose  . divalproex (DEPAKOTE ER) 500 MG 24 hr tablet Take 2 tablets (1,000 mg total) by mouth at bedtime. (Patient not taking: Reported on 12/21/2015) 60 tablet 0 Not Taking at Unknown time  . hydrOXYzine (ATARAX/VISTARIL) 25 MG tablet Take 1 tablet (25 mg total) by mouth 3 (three) times daily as needed for anxiety. (Patient not taking: Reported on 12/21/2015) 30 tablet 0 Not Taking at Unknown time  . metFORMIN (GLUCOPHAGE) 500 MG tablet Take 1 tablet (500 mg total) by mouth daily with breakfast. (Patient not taking: Reported on 12/21/2015) 30 tablet 0 Not Taking at Unknown time  . oxybutynin (DITROPAN-XL) 10 MG 24 hr tablet Take 1 tablet (10 mg total) by mouth at bedtime. (Patient not taking: Reported on 12/21/2015) 30 tablet 0 Not Taking at Unknown time  . paliperidone (INVEGA SUSTENNA) 156 MG/ML  SUSP injection Inject 1 mL (156 mg total) into the muscle once. 0.9 mL 0   . paliperidone (INVEGA SUSTENNA) 156 MG/ML SUSP injection Inject 0.75 mLs (117 mg total) into the muscle every 28 (twenty-eight) days. Next dose is due 09/07/2015 (Patient not taking: Reported on 12/21/2015) 0.9 mL 0 Not Taking at Unknown time  . paliperidone (INVEGA) 6 MG 24 hr tablet Take 1 tablet (6 mg total) by mouth at bedtime. 3 tablet 0   . pantoprazole (PROTONIX) 40 MG tablet Take 1 tablet (40 mg total) by mouth daily. (Patient not taking: Reported on 12/21/2015) 30 tablet 0 Not Taking at Unknown time    Treatment Modalities: Medication Management, Group therapy, Case management,  1 to 1 session with clinician, Psychoeducation, Recreational therapy.   Physician  Treatment Plan for Primary Diagnosis: Schizoaffective disorder, bipolar type ([place]) Long Term Goal(s): Improvement in symptoms so as ready for discharge  Short Term Goals: Ability to verbalize feelings will improve   Medication Management: Evaluate patient's response, side effects, and tolerance of medication regimen.  Therapeutic Interventions: 1 to 1 sessions, Unit Group sessions and Medication administration.  Evaluation of Outcomes: Progressing  Physician Treatment Plan for Secondary Diagnosis: Principal Problem:   Schizoaffective disorder, bipolar type ([place]) Active Problems:   Non compliance w medication regimen   Diabetes mellitus ([place])   Long Term Goal(s): Improvement in symptoms so as ready for discharge  Short Term Goals:  Compliance with prescribed medications will improve  Medication Management: Evaluate patient's response, side effects, and tolerance of medication regimen.  Therapeutic Interventions: 1 to 1 sessions, Unit Group sessions and Medication administration.  Evaluation of Outcomes: Progressing   12/21:For Psychosis: Will continue Abilify 15 mg po - change dosing time to AM. Pt refused past two doses - but took it this AM .   For Mood lability: Will change Depakote to 500 mg po bid. Pt refused it previous doses - but was able to take it this AM. Depakote level on 12/25/15- 56 ( therapeutic)   RN Treatment Plan for Primary Diagnosis: Schizoaffective disorder, bipolar type ([place]) Long Term Goal(s): Knowledge of disease and therapeutic regimen to maintain health will improve  Short Term Goals: Ability to participate in decision making will improve and Compliance with prescribed medications will improve  Medication Management: RN will administer medications as ordered by provider, will assess and evaluate patient's response and provide education to patient for prescribed medication. RN will report any adverse and/or side effects to prescribing  provider.  Therapeutic Interventions: 1 on 1 counseling sessions, Psychoeducation, Medication administration, Evaluate responses to treatment, Monitor vital signs and CBGs as ordered, Perform/monitor CIWA, COWS, AIMS and Fall Risk screenings as ordered, Perform wound care treatments as ordered.  Evaluation of Outcomes: Progressing   Recreational Therapy Treatment Plan for Primary Diagnosis: Schizoaffective disorder, bipolar type ([place]) Long Term Goal(s):  LTG- Patient will participate in recreation therapy tx in at least 2 group sessions without prompting from LRT.  Short Term Goals:  Patient will demonstrate increased ability to follow instructions, as demonstrated by ability to follow LRT instructions on first prompt during recreation therapy group sessions.  Treatment Modalities: Group and Pet Therapy  Therapeutic Interventions: Psychoeducation  Evaluation of Outcomes: Progressing   LCSW Treatment Plan for Primary Diagnosis: Schizoaffective disorder, bipolar type ([place]) Long Term Goal(s): Safe transition to appropriate next level of care at discharge, Engage patient in therapeutic group addressing interpersonal concerns.  Short Term Goals: Engage patient in aftercare planning with referrals and resources  Therapeutic Interventions: Assess for all discharge needs, 1 to 1 time with Education officer, museum, Explore available resources and support systems, Assess for adequacy in community support network, Educate family and significant other(s) on suicide prevention, Complete Psychosocial Assessment, Interpersonal group therapy.  Evaluation of Outcomes: Not Met  Unable to engage patient in any meaningful conversation 12/21:  Remains the same   Progress in Treatment: Attending groups: No Participating in groups: No Taking medication as prescribed: Yes Toleration medication: Yes, no side effects reported at this time Family/Significant other contact made: No Patient understands diagnosis: No   Limited insight Discussing patient identified problems/goals with staff: Yes Medical problems stabilized or resolved: Yes Denies suicidal/homicidal ideation: Yes Issues/concerns per patient self-inventory: None Other: N/A  New problem(s) identified: None identified at this time.   New Short Term/Long Term Goal(s): None identified at this time.   Discharge Plan or Barriers: Unknown  Reason for Continuation of Hospitalization: Paranoia Disorganization Medication stabilization    Estimated Length of Stay: 3-5 days  Attendees: Patient: 12/27/2015  2:25 PM  Physician: Neita Garnet, MD 12/27/2015  2:25 PM  Nursing: Phillis Haggis, RN 12/27/2015  2:25 PM  RN Care Manager: Lars Pinks, RN 12/27/2015  2:25 PM  Social Worker: Ripley Fraise 12/27/2015  2:25 PM  Recreational Therapist: Laretta Bolster  12/27/2015  2:25 PM  Other: Norberto Sorenson 12/27/2015  2:25 PM  Other:  12/27/2015  2:25 PM    Scribe for Treatment Team:  Roque Lias 12/27/2015 2:25 PM

## 2015-12-27 NOTE — [facility] Group Notes (Signed)
[facility] Group Notes:  (Counselor/Nursing/MHT/Case Management/Adjunct)  12/27/2015 1:15PM  Type of Therapy:  Group Therapy  Participation Level:  Active  Participation Quality:  Appropriate  Affect:  Flat  Cognitive:  Oriented  Insight:  Improving  Engagement in Group:  Limited  Engagement in Therapy:  Limited  Modes of Intervention:  Discussion, Exploration and Socialization  Summary of Progress/Problems: The topic for group was balance in life.  Pt participated in the discussion about when their life was in balance and out of balance and how this feels.  Pt discussed ways to get back in balance and short term goals they can work on to get where they want to be. Invited.  Chose to not attend.   [name] [name] 12/27/2015 2:27 PM

## 2015-12-27 NOTE — Progress Notes (Signed)
Recreation Therapy Notes  Date: 12/27/15 Time: 1000 Location: 500 Hall Dayroom  Group Topic: Anger Management  Goal Area(s) Addresses:  Patient will identify triggers for anger.  Patient will identify physical reaction to anger.   Patient will identify benefit of using coping skills when angry.  Intervention: Anger worksheet, pencils  Activity: Anger Umbrella.  Patients were given a worksheet with an umbrella on it.  Inside to umbrella, patients were to write out the emotions that cause then to get angry.  If patients couldn't think of the emotions that cause them to get angry, they were to write out the things/situations that cause them to get angry.  Patients were then to identify coping skills they could use to deal with their anger and write them on the outside of the umbrella.  Education: Anger Management, Discharge Planning   Education Outcome: Acknowledges education/In group clarification offered/Needs additional education.   Clinical Observations/Feedback: Pt did not attend group.   Victorino Sparrow, LRT/CTRS          Victorino Sparrow A 12/27/2015 11:36 AM

## 2015-12-28 LAB — GLUCOSE, CAPILLARY
Glucose-Capillary: 76 mg/dL (ref 65–99)
Glucose-Capillary: 103 mg/dL — ABNORMAL HIGH (ref 65–99)
Glucose-Capillary: 116 mg/dL — ABNORMAL HIGH (ref 65–99)

## 2015-12-28 NOTE — Progress Notes (Signed)
A: Pt A & O to self and place. Presents with avertive to poor eye contact, depressed mood but forwards a little during conversations. Visible at nursing station at intervals during shift (shaving, drinks etc). Stated to Probation officer "I'm just having problems controlling my thoughts" when approached. Shaved without problems.  A: Emotional support and availability provided to pt. Medications administered as prescribed. Metformin held this AM related to low CBG result. Encouraged pt to voice concerns, comply with medications and attend groups. Q 15 minutes checks continues without self harm gestures or outburst.  R: Pt receptive to care. Took her medications when offered, compliant with CBG monitoring. Denies adverse drug reactions at this time. Tolerates all PO intake well. Remains safe on unit. POC maintained safety and mood stability.

## 2015-12-28 NOTE — Progress Notes (Signed)
Recreation Therapy Notes  Date: 12/28/15 Time: 1000 Location: 500 Hall Dayroom  Group Topic: Self-Expression  Goal Area(s) Addresses:  Patient will identify positive ways to express themselves. Patient will verbalize benefit of increased self-expression.  Intervention: Markers, Architect paper  Activity:  Holiday Cards.  Patients were given markers and construction paper to create a holiday card for themselves or someone special to them.  Education:  Self-Expression, Dentist.   Education Outcome: Acknowledges education/In group clarification offered/Needs additional education  Clinical Observations/Feedback: Pt did not attend group.   Victorino Sparrow, LRT/CTRS         Ria Comment, Alvilda Mckenna A 12/28/2015 11:59 AM

## 2015-12-28 NOTE — Progress Notes (Signed)
D: Pt is alert and oriented x 4; Pt denies depression, anxiety, pain, SI, HI or AVH; she states, "I feel much better at this moment." Pt is very flat, minimal interaction and withdrawn to room. Pt remained nonviolent through the shift. A: Medications offered as prescribed.  Support, encouragement, and safe environment provided.  15-minute safety checks continue. R: Pt was med compliant.  Pt did not attend group. Safety checks continue.

## 2015-12-28 NOTE — Progress Notes (Addendum)
[facility] MD Progress Note  12/28/2015 3:06 PM Emmilyn Reveron  MRN:  [id number]  Subjective: Patient reports she is feeling better than when she came in to hospital. At this time does not endorse medication side effects.   Objective:  Patient seen with CSW/staff and case discussed  Patient is a 35 year old female , history of Schizoaffective Disorder, admitted under IVC due to worsening psychosis, agitation , disorganized behaviors. At this time patient is improved but continues to present with flat affect, minimal eye contact, and isolative behavior.  Today she focuses on recent dream, and is hard to follow, stating that it involved doors and a premonition regarding a fire. Responds to reassurance , support. Denies any HI or any intention of starting any fire. She denies severe depression or any suicidal ideations, she does express anxiety, mainly about disposition options, and she states returning to live with the pastor she had been living with would not be good for her at this time. Denies medication side effects.    Principal Problem: Schizoaffective disorder, bipolar type ([place]) Diagnosis:   Patient Active Problem List   Diagnosis Date Noted  . Insomnia [G47.00]   . Anxiety state [F41.1]   . Overactive bladder [N32.81]   . Diabetes mellitus ([place]) [E11.9] 02/08/2015  . Schizoaffective disorder, bipolar type ([place]) [F25.0] 01/28/2015  . Non compliance w medication regimen [Z91.14]    Total Time spent with patient: 20 minutes   Past Psychiatric History: Please see H&P.  Past Medical History:  Past Medical History:  Diagnosis Date  . Bipolar affective disorder, currently manic, mild ([place])   . Diabetes mellitus without complication ([place])   . Schizophrenia ([place])    History reviewed. No pertinent surgical history. Family History:  Family History  Problem Relation Age of Onset  . Drug abuse Maternal Uncle    Family Psychiatric  History: Please see H&P.  Social History: Please  see H&P.  History  Alcohol Use No     History  Drug Use No    Social History   Social History  . Marital status: Legally Separated    Spouse name: N/A  . Number of children: N/A  . Years of education: N/A   Social History Main Topics  . Smoking status: Never Smoker  . Smokeless tobacco: Never Used  . Alcohol use No  . Drug use: No  . Sexual activity: Not Currently   Other Topics Concern  . None   Social History Narrative  . None   Additional Social History:    Pain Medications: See MAR  Prescriptions: See MAR Over the Counter: See MAR History of alcohol / drug use?: No history of alcohol / drug abuse  Sleep: observed as fair  Appetite:  Fair   Current Medications: Current Facility-Administered Medications  Medication Dose Route Frequency Provider Last Rate Last Dose  . acetaminophen (TYLENOL) tablet 650 mg  650 mg Oral Q6H PRN Benjamine Mola, FNP      . alum & mag hydroxide-simeth (MAALOX/MYLANTA) 200-200-20 MG/5ML suspension 30 mL  30 mL Oral Q4H PRN Benjamine Mola, FNP   30 mL at 12/27/15 1805  . ARIPiprazole (ABILIFY) tablet 15 mg  15 mg Oral Daily Ursula Alert, MD   15 mg at 12/28/15 0826  . divalproex (DEPAKOTE) DR tablet 500 mg  500 mg Oral Q12H Ursula Alert, MD   500 mg at 12/28/15 0826  . feeding supplement (GLUCERNA SHAKE) (GLUCERNA SHAKE) liquid 237 mL  237 mL Oral TID BM  Ursula Alert, MD   237 mL at 12/28/15 1000  . hydrOXYzine (ATARAX/VISTARIL) tablet 25 mg  25 mg Oral TID PRN Benjamine Mola, FNP   25 mg at 12/22/15 2125  . insulin aspart (novoLOG) injection 0-15 Units  0-15 Units Subcutaneous TID WC Ursula Alert, MD   0 Units at 12/26/15 1225  . insulin aspart (novoLOG) injection 0-5 Units  0-5 Units Subcutaneous QHS Ursula Alert, MD   Stopped at 12/23/15 2203  . LORazepam (ATIVAN) tablet 1 mg  1 mg Oral Q6H PRN Ursula Alert, MD       Or  . LORazepam (ATIVAN) injection 1 mg  1 mg Intramuscular Q6H PRN Saramma Eappen, MD      . magnesium  hydroxide (MILK OF MAGNESIA) suspension 30 mL  30 mL Oral Daily PRN Benjamine Mola, FNP      . metFORMIN (GLUCOPHAGE) tablet 500 mg  500 mg Oral Q breakfast Benjamine Mola, FNP   500 mg at 12/26/15 1222  . mirtazapine (REMERON) tablet 7.5 mg  7.5 mg Oral QHS Saramma Eappen, MD   7.5 mg at 12/26/15 2245  . OLANZapine (ZYPREXA) tablet 10 mg  10 mg Oral TID PRN Ursula Alert, MD       Or  . OLANZapine (ZYPREXA) injection 10 mg  10 mg Intramuscular TID PRN Ursula Alert, MD      . oxybutynin (DITROPAN-XL) 24 hr tablet 10 mg  10 mg Oral QHS Benjamine Mola, FNP   10 mg at 12/27/15 2150   Lab Results:  Results for orders placed or performed during the hospital encounter of 12/21/15 (from the past 48 hour(s))  Glucose, capillary     Status: Abnormal   Collection Time: 12/27/15 10:24 AM  Result Value Ref Range   Glucose-Capillary 102 (H) 65 - 99 mg/dL  Glucose, capillary     Status: Abnormal   Collection Time: 12/27/15 12:14 PM  Result Value Ref Range   Glucose-Capillary 100 (H) 65 - 99 mg/dL  Glucose, capillary     Status: Abnormal   Collection Time: 12/27/15  9:17 PM  Result Value Ref Range   Glucose-Capillary 144 (H) 65 - 99 mg/dL  Glucose, capillary     Status: None   Collection Time: 12/28/15  6:12 AM  Result Value Ref Range   Glucose-Capillary 76 65 - 99 mg/dL  Glucose, capillary     Status: Abnormal   Collection Time: 12/28/15 11:57 AM  Result Value Ref Range   Glucose-Capillary 103 (H) 65 - 99 mg/dL   Comment 1 Notify RN    Comment 2 Document in Chart    Blood Alcohol level:  Lab Results  Component Value Date   ETH <5 12/20/2015   ETH <5 123XX123    Metabolic Disorder Labs: Lab Results  Component Value Date   HGBA1C 5.6 07/25/2015   MPG 114 07/25/2015   MPG 137 02/01/2015   Lab Results  Component Value Date   PROLACTIN 46.5 (H) 07/25/2015   PROLACTIN 21.6 02/01/2015   Lab Results  Component Value Date   CHOL 135 07/25/2015   TRIG 59 07/25/2015   HDL 50  07/25/2015   CHOLHDL 2.7 07/25/2015   VLDL 12 07/25/2015   LDLCALC 73 07/25/2015   LDLCALC 78 02/01/2015   Physical Findings: AIMS: Facial and Oral Movements Muscles of Facial Expression: None, normal Lips and Perioral Area: None, normal Jaw: None, normal Tongue: None, normal,Extremity Movements Upper (arms, wrists, hands, fingers): None, normal Lower (legs, knees,  ankles, toes): None, normal, Trunk Movements Neck, shoulders, hips: None, normal, Overall Severity Severity of abnormal movements (highest score from questions above): None, normal Incapacitation due to abnormal movements: None, normal Patient's awareness of abnormal movements (rate only patient's report): No Awareness, Dental Status Current problems with teeth and/or dentures?: No Does patient usually wear dentures?: No  CIWA:    COWS:     Musculoskeletal: Strength & Muscle Tone: within normal limits Gait & Station: normal Patient leans: N/A  Psychiatric Specialty Exam: Physical Exam  Nursing note and vitals reviewed.   Review of Systems  Unable to perform ROS: Mental acuity    Blood pressure 136/79, pulse (!) 131, temperature 99.1 F (37.3 C), temperature source Oral, resp. rate 18, height 5\' 4"  (1.626 m), weight 68 kg (150 lb), SpO2 100 %.Body mass index is 25.75 kg/m.  General Appearance: fairly groomed   Eye Contact:  Minimal   Speech: improved   Volume:  Soft   Mood:  Denies depression, but presents with blunted, restricted affect  Affect:  Blunted  Thought Process:  Becomes disorganized at times   Orientation:  Other:  situation, place, person  Thought Content:  Denies hallucinations, does not currently present internally preoccupied   Suicidal Thoughts:  Denies suicidal ideations or self injurious ideations  Homicidal Thoughts:  Denies homicidal or violent ideations  Memory: recent and remote fair   Judgement:  Fair   Insight:  Fair   Psychomotor Activity:  Decreased  Concentration:   Concentration: Fair and Attention Span: Fair  Recall:  [employer] of Knowledge:  Fair  Language:  Fair  Akathisia:  No  Handed:  Right  AIMS (if indicated):     Assets:  Others:  access to healthcare  ADL's:  Impaired  Cognition:  WNL  Sleep:  Number of Hours: 4.25    Assessment - patient presents partially improved compared to admission, but remains blunted in affect, withdrawn, isolative. Today more linear in thought process and future oriented, but became disorganized and anxious when discussing a dream she had . Responds partially to empathy, support . Today denies medication side effects.  Treatment Plan Summary: Schizoaffective disorder, bipolar type ([place])   Daily contact with patient to assess and evaluate symptoms and progress in treatment and Medication management  Encourage group and milieu participation to work on coping skills and symptom reduction For Psychosis: Continue  Abilify 15 mg po daily   For Mood lability: Continue Depakote  500 mg po bid. Depakote level on 12/25/15- 56 ( therapeutic)   For anxiety/agitation: PRN medications as per agitation protocol.  For insomnia ( and depression  ) Continue Remeron 7.5 mg po qhs.  For Diabetes: Restarted  home medications. SSI /CBG as per unit protocol.  Recheck BMP in AM   Treatment team working on disposition planning options     Neita Garnet, MD 12/28/2015, 3:06 PM   Patient ID: [name], female   DOB: 1980-05-13, 35 y.o.   MRN: [id number]

## 2015-12-28 NOTE — [facility] Group Notes (Signed)
[facility] LCSW Group Therapy  12/28/2015  1:05 PM  Type of Therapy:  Group therapy  Participation Level:  Active  Participation Quality:  Attentive  Affect:  Flat  Cognitive:  Oriented  Insight:  Limited  Engagement in Therapy:  Limited  Modes of Intervention:  Discussion, Socialization  Summary of Progress/Problems:  Chaplain was here to lead a group on themes of hope and courage. Invited.  Chose to not attend.  [name] [name] 12/28/2015 1:52 PM

## 2015-12-29 LAB — GLUCOSE, CAPILLARY
Glucose-Capillary: 88 mg/dL (ref 65–99)
Glucose-Capillary: 99 mg/dL (ref 65–99)

## 2015-12-29 NOTE — Progress Notes (Signed)
DAR NOTE: Patient presents with flat affect and depressed mood.  Denies pain, auditory and visual hallucinations.  Maintained on routine safety checks.  Medications given as prescribed.  Support and encouragement offered as needed.  Patient remained withdrawn and isolative.  Minimal interaction with staff.  No signs and symptoms of hypoglycemia noted.

## 2015-12-29 NOTE — Progress Notes (Signed)
[facility] MD Progress Note  12/29/2015 12:58 PM Pegi Heiberger  MRN:  [id number]  Subjective: [place] reports, "I'm well, my mood is good. I slept well, dreamt & woke up this morning".   Objective:  Patient seen with & case discussed  Patient is a 35 year old female, history of Schizoaffective Disorder, admitted under IVC due to worsening psychosis, agitation , disorganized behaviors. At this time patient is improved but continues to present with  restricted affect, minimal eye contact, isolative behavior & remains delusional.  Today she focuses on recent dream, and is hard to follow, stating that it involved doors and a premonition regarding a fire. Responds to reassurance , support. Denies any HI or any intention of starting any fire. She denies severe depression or any suicidal ideations, she does express anxiety, mainly about disposition options, and she states returning to live with the pastor she had been living with would not be good for her at this time. Denies medication side effects.   Principal Problem: Schizoaffective disorder, bipolar type ([place]) Diagnosis:   Patient Active Problem List   Diagnosis Date Noted  . Insomnia [G47.00]   . Anxiety state [F41.1]   . Overactive bladder [N32.81]   . Diabetes mellitus ([place]) [E11.9] 02/08/2015  . Schizoaffective disorder, bipolar type ([place]) [F25.0] 01/28/2015  . Non compliance w medication regimen [Z91.14]    Total Time spent with patient: 15 minutes   Past Psychiatric History: Please see H&P.  Past Medical History:  Past Medical History:  Diagnosis Date  . Bipolar affective disorder, currently manic, mild ([place])   . Diabetes mellitus without complication ([place])   . Schizophrenia ([place])    History reviewed. No pertinent surgical history.  Family History:  Family History  Problem Relation Age of Onset  . Drug abuse Maternal Uncle    Family Psychiatric  History: Please see H&P.  Social History: Please see H&P.  History   Alcohol Use No     History  Drug Use No    Social History   Social History  . Marital status: Legally Separated    Spouse name: N/A  . Number of children: N/A  . Years of education: N/A   Social History Main Topics  . Smoking status: Never Smoker  . Smokeless tobacco: Never Used  . Alcohol use No  . Drug use: No  . Sexual activity: Not Currently   Other Topics Concern  . None   Social History Narrative  . None   Additional Social History:    Pain Medications: See MAR  Prescriptions: See MAR Over the Counter: See MAR History of alcohol / drug use?: No history of alcohol / drug abuse  Sleep: observed as fair  Appetite:  Fair   Current Medications: Current Facility-Administered Medications  Medication Dose Route Frequency Provider Last Rate Last Dose  . acetaminophen (TYLENOL) tablet 650 mg  650 mg Oral Q6H PRN Benjamine Mola, FNP      . alum & mag hydroxide-simeth (MAALOX/MYLANTA) 200-200-20 MG/5ML suspension 30 mL  30 mL Oral Q4H PRN Benjamine Mola, FNP   30 mL at 12/27/15 1805  . ARIPiprazole (ABILIFY) tablet 15 mg  15 mg Oral Daily Saramma Eappen, MD   15 mg at 12/29/15 0800  . divalproex (DEPAKOTE) DR tablet 500 mg  500 mg Oral Q12H Saramma Eappen, MD   500 mg at 12/29/15 0800  . feeding supplement (GLUCERNA SHAKE) (GLUCERNA SHAKE) liquid 237 mL  237 mL Oral TID BM Ursula Alert, MD  237 mL at 12/29/15 0956  . hydrOXYzine (ATARAX/VISTARIL) tablet 25 mg  25 mg Oral TID PRN Benjamine Mola, FNP   25 mg at 12/22/15 2125  . insulin aspart (novoLOG) injection 0-15 Units  0-15 Units Subcutaneous TID WC Ursula Alert, MD   0 Units at 12/26/15 1225  . insulin aspart (novoLOG) injection 0-5 Units  0-5 Units Subcutaneous QHS Ursula Alert, MD   Stopped at 12/23/15 2203  . LORazepam (ATIVAN) tablet 1 mg  1 mg Oral Q6H PRN Ursula Alert, MD       Or  . LORazepam (ATIVAN) injection 1 mg  1 mg Intramuscular Q6H PRN Saramma Eappen, MD      . magnesium hydroxide (MILK OF  MAGNESIA) suspension 30 mL  30 mL Oral Daily PRN Benjamine Mola, FNP      . metFORMIN (GLUCOPHAGE) tablet 500 mg  500 mg Oral Q breakfast Benjamine Mola, FNP   500 mg at 12/29/15 0800  . mirtazapine (REMERON) tablet 7.5 mg  7.5 mg Oral QHS Saramma Eappen, MD   7.5 mg at 12/29/15 0112  . OLANZapine (ZYPREXA) tablet 10 mg  10 mg Oral TID PRN Ursula Alert, MD       Or  . OLANZapine (ZYPREXA) injection 10 mg  10 mg Intramuscular TID PRN Ursula Alert, MD      . oxybutynin (DITROPAN-XL) 24 hr tablet 10 mg  10 mg Oral QHS Benjamine Mola, FNP   10 mg at 12/27/15 2150   Lab Results:  Results for orders placed or performed during the hospital encounter of 12/21/15 (from the past 48 hour(s))  Glucose, capillary     Status: Abnormal   Collection Time: 12/27/15  9:17 PM  Result Value Ref Range   Glucose-Capillary 144 (H) 65 - 99 mg/dL  Glucose, capillary     Status: None   Collection Time: 12/28/15  6:12 AM  Result Value Ref Range   Glucose-Capillary 76 65 - 99 mg/dL  Glucose, capillary     Status: Abnormal   Collection Time: 12/28/15 11:57 AM  Result Value Ref Range   Glucose-Capillary 103 (H) 65 - 99 mg/dL   Comment 1 Notify RN    Comment 2 Document in Chart   Glucose, capillary     Status: Abnormal   Collection Time: 12/28/15  5:52 PM  Result Value Ref Range   Glucose-Capillary 116 (H) 65 - 99 mg/dL  Glucose, capillary     Status: None   Collection Time: 12/29/15 11:54 AM  Result Value Ref Range   Glucose-Capillary 88 65 - 99 mg/dL   Blood Alcohol level:  Lab Results  Component Value Date   ETH <5 12/20/2015   ETH <5 123XX123   Metabolic Disorder Labs: Lab Results  Component Value Date   HGBA1C 5.6 07/25/2015   MPG 114 07/25/2015   MPG 137 02/01/2015   Lab Results  Component Value Date   PROLACTIN 46.5 (H) 07/25/2015   PROLACTIN 21.6 02/01/2015   Lab Results  Component Value Date   CHOL 135 07/25/2015   TRIG 59 07/25/2015   HDL 50 07/25/2015   CHOLHDL 2.7  07/25/2015   VLDL 12 07/25/2015   LDLCALC 73 07/25/2015   LDLCALC 78 02/01/2015   Physical Findings: AIMS: Facial and Oral Movements Muscles of Facial Expression: None, normal Lips and Perioral Area: None, normal Jaw: None, normal Tongue: None, normal,Extremity Movements Upper (arms, wrists, hands, fingers): None, normal Lower (legs, knees, ankles, toes): None, normal, Trunk Movements  Neck, shoulders, hips: None, normal, Overall Severity Severity of abnormal movements (highest score from questions above): None, normal Incapacitation due to abnormal movements: None, normal Patient's awareness of abnormal movements (rate only patient's report): No Awareness, Dental Status Current problems with teeth and/or dentures?: No Does patient usually wear dentures?: No  CIWA:    COWS:     Musculoskeletal: Strength & Muscle Tone: within normal limits Gait & Station: normal Patient leans: N/A  Psychiatric Specialty Exam: Physical Exam  Nursing note and vitals reviewed.   Review of Systems  Unable to perform ROS: Mental acuity    Blood pressure 106/68, pulse (!) 107, temperature 98.1 F (36.7 C), resp. rate 18, height 5\' 4"  (1.626 m), weight 68 kg (150 lb), SpO2 100 %.Body mass index is 25.75 kg/m.  General Appearance: fairly groomed   Eye Contact:  Minimal   Speech: improved   Volume:  Soft   Mood:  Denies depression, but presents with blunted, restricted affect  Affect:  Blunted  Thought Process:  Becomes disorganized at times   Orientation:  Other:  situation, place, person  Thought Content:  Denies hallucinations, does not currently present internally preoccupied   Suicidal Thoughts:  Denies suicidal ideations or self injurious ideations  Homicidal Thoughts:  Denies homicidal or violent ideations  Memory: recent and remote fair   Judgement:  Fair   Insight:  Fair   Psychomotor Activity:  Decreased  Concentration:  Concentration: Fair and Attention Span: Fair  Recall:  [employer] of Knowledge:  Fair  Language:  Fair  Akathisia:  No  Handed:  Right  AIMS (if indicated):     Assets:  Others:  access to healthcare  ADL's:  Impaired  Cognition:  WNL  Sleep:  Number of Hours: 4.25   Assessment - patient presents partially improved compared to admission, but remains blunted in affect, withdrawn, isolative. Today more linear in thought process and future oriented, but became disorganized and anxious when discussing a dream she had . Responds partially to empathy, support . Today denies medication side effects.   Treatment Plan Summary: Schizoaffective disorder, bipolar type ([place])  Daily contact with patient to assess and evaluate symptoms and progress in treatment and Medication management  Encourage group and milieu participation to work on coping skills and symptom reduction For Psychosis: Continue  Abilify 15 mg po daily   For Mood lability: Continue Depakote  500 mg po bid. Depakote level on 12/25/15- 56, result reviewed (therapeutic)   For anxiety/agitation: PRN medications as per agitation protocol.  For insomnia (and depression ) Continue Remeron 7.5 mg po qhs.  For Diabetes: Restarted  home medications. SSI /CBG as per unit protocol.  Recheck BMP in AM, result pending.  Treatment team working on disposition planning options   Encarnacion Slates, NP, PMHNP, FNP-BC 12/29/2015, 12:58 PM   Patient ID: [name], female   DOB: [birth year], 35 y.o.   MRN: [id number] Agree with NP progress note as above

## 2015-12-29 NOTE — Progress Notes (Signed)
D: Pt is flat and withdrawn to room; remained in bed all evening. Pt made minimal eye contact. Pt refused to participate during assessment; will not answer any assessment question (elective mutism). Pt does not look to be in any acute distress.  A: Medications offered as prescribed.  Support, encouragement, and safe environment provided.  15-minute safety checks continue. R: Pt refused all night meds.  Pt did not attend wrap up group. Safety checks continue.

## 2015-12-29 NOTE — [facility] Group Notes (Signed)
[place] Group Notes: (Clinical Social Work)   12/29/2015      Type of Therapy:  Group Therapy   Participation Level:  Did Not Attend despite MHT prompting   Selmer Dominion, LCSW 12/29/2015, 2:35 PM

## 2015-12-30 LAB — GLUCOSE, CAPILLARY
GLUCOSE-CAPILLARY: 91 mg/dL (ref 65–99)
Glucose-Capillary: 83 mg/dL (ref 65–99)
Glucose-Capillary: 79 mg/dL (ref 65–99)
Glucose-Capillary: 107 mg/dL — ABNORMAL HIGH (ref 65–99)

## 2015-12-30 LAB — VALPROIC ACID LEVEL: Valproic Acid Lvl: 124 ug/mL — ABNORMAL HIGH (ref 50.0–100.0)

## 2015-12-30 NOTE — Progress Notes (Signed)
The focus of this group is to help patients review their daily goal of treatment and discuss progress on daily workbooks. Pt did not attend the evening group. 

## 2015-12-30 NOTE — Progress Notes (Signed)
Nursing Note 12/30/2015 [id number]  Data Reports sleeping OK.  Affect wide ranged.  Speech somewhat tangential.  Reported fear of having HIV today, was talking about "two dots that were on my arm."  Encouraged to talk to provider about it.  Denies HI, SI.  When asked about AVH patient said "it's better if I don't talk about that."  Did not attend groups despite encouragement.  Spent most of day in room.   Action Spoke with patient 1:1, nurse offered support to patient throughout shift.  Continues to be monitored on 15 minute checks for safety.  Response Remains safe on unit, pleasant, though keeps to self and appears somewhat suspicious.

## 2015-12-30 NOTE — [facility] Group Notes (Signed)
[place] Group Notes: (Clinical Social Work)   12/30/2015      Type of Therapy:  Group Therapy   Participation Level:  Did Not Attend despite MHT prompting   Selmer Dominion, LCSW 12/30/2015, 2:11 PM

## 2015-12-30 NOTE — Progress Notes (Signed)
D: Pt is flat and withdrawn to room; remained in bed all evening. Pt made minimal eye contact. Pt refused to participate during assessment; will not answer any assessment question; states, "I don't want to go into all that with you today." Pt seem to have a lot on her mind.  A: Medications offered as prescribed.  Support, encouragement, and safe environment provided.  15-minute safety checks continue. R: Pt refused all night meds but her Depakote, "that is all I need."  Pt did not attend wrap up group. Safety checks continue.

## 2015-12-30 NOTE — Progress Notes (Signed)
[facility] MD Progress Note  12/30/2015 9:57 AM [name] [name]  MRN:  [id number]  Subjective: Patient reports " I am feeling okay today , I just need to stay to myself."  Patient report " my last stay her in November I was sexually active and my have Aids."   Objective:  [name] [name] is awake, alert and disorganized with her thoughts. Seen resting in bedroom reading her Bible. Denies suicidal or homicidal ideation. Patient reports auditory hallucination states she is learning to deal with the voices. Patient appear to be responding to internal stimuli and some thought blocking. Patient reports she is medication compliant without mediation side effects. Reports good appetite and reports she is resting well.  Support, encouragement and reassurance was provided.   Principal Problem: Schizoaffective disorder, bipolar type ([place]) Diagnosis:   Patient Active Problem List   Diagnosis Date Noted  . Insomnia [G47.00]   . Anxiety state [F41.1]   . Overactive bladder [N32.81]   . Diabetes mellitus ([place]) [E11.9] 02/08/2015  . Schizoaffective disorder, bipolar type ([place]) [F25.0] 01/28/2015  . Non compliance w medication regimen [Z91.14]    Total Time spent with patient: 15 minutes   Past Psychiatric History: Please see H&P.  Past Medical History:  Past Medical History:  Diagnosis Date  . Bipolar affective disorder, currently manic, mild ([place])   . Diabetes mellitus without complication ([place])   . Schizophrenia ([place])    History reviewed. No pertinent surgical history.  Family History:  Family History  Problem Relation Age of Onset  . Drug abuse Maternal Uncle    Family Psychiatric  History: Please see H&P.  Social History: Please see H&P.  History  Alcohol Use No     History  Drug Use No    Social History   Social History  . Marital status: Legally Separated    Spouse name: N/A  . Number of children: N/A  . Years of education: N/A   Social History Main Topics  .  Smoking status: Never Smoker  . Smokeless tobacco: Never Used  . Alcohol use No  . Drug use: No  . Sexual activity: Not Currently   Other Topics Concern  . None   Social History Narrative  . None   Additional Social History:    Pain Medications: See MAR  Prescriptions: See MAR Over the Counter: See MAR History of alcohol / drug use?: No history of alcohol / drug abuse  Sleep: Poor  Appetite:  Fair   Current Medications: Current Facility-Administered Medications  Medication Dose Route Frequency Provider Last Rate Last Dose  . acetaminophen (TYLENOL) tablet 650 mg  650 mg Oral Q6H PRN Benjamine Mola, FNP      . alum & mag hydroxide-simeth (MAALOX/MYLANTA) 200-200-20 MG/5ML suspension 30 mL  30 mL Oral Q4H PRN Benjamine Mola, FNP   30 mL at 12/27/15 1805  . ARIPiprazole (ABILIFY) tablet 15 mg  15 mg Oral Daily Ursula Alert, MD   15 mg at 12/30/15 0805  . divalproex (DEPAKOTE) DR tablet 500 mg  500 mg Oral Q12H Saramma Eappen, MD   500 mg at 12/30/15 0805  . feeding supplement (GLUCERNA SHAKE) (GLUCERNA SHAKE) liquid 237 mL  237 mL Oral TID BM Saramma Eappen, MD   237 mL at 12/29/15 1400  . hydrOXYzine (ATARAX/VISTARIL) tablet 25 mg  25 mg Oral TID PRN Benjamine Mola, FNP   25 mg at 12/22/15 2125  . insulin aspart (novoLOG) injection 0-15 Units  0-15 Units Subcutaneous TID WC  Ursula Alert, MD   0 Units at 12/26/15 1225  . insulin aspart (novoLOG) injection 0-5 Units  0-5 Units Subcutaneous QHS Ursula Alert, MD   Stopped at 12/23/15 2203  . LORazepam (ATIVAN) tablet 1 mg  1 mg Oral Q6H PRN Ursula Alert, MD       Or  . LORazepam (ATIVAN) injection 1 mg  1 mg Intramuscular Q6H PRN Saramma Eappen, MD      . magnesium hydroxide (MILK OF MAGNESIA) suspension 30 mL  30 mL Oral Daily PRN Benjamine Mola, FNP      . metFORMIN (GLUCOPHAGE) tablet 500 mg  500 mg Oral Q breakfast Benjamine Mola, FNP   500 mg at 12/30/15 0805  . mirtazapine (REMERON) tablet 7.5 mg  7.5 mg Oral QHS  Saramma Eappen, MD   7.5 mg at 12/29/15 0112  . OLANZapine (ZYPREXA) tablet 10 mg  10 mg Oral TID PRN Ursula Alert, MD       Or  . OLANZapine (ZYPREXA) injection 10 mg  10 mg Intramuscular TID PRN Ursula Alert, MD      . oxybutynin (DITROPAN-XL) 24 hr tablet 10 mg  10 mg Oral QHS Benjamine Mola, FNP   10 mg at 12/27/15 2150   Lab Results:  Results for orders placed or performed during the hospital encounter of 12/21/15 (from the past 48 hour(s))  Glucose, capillary     Status: Abnormal   Collection Time: 12/28/15 11:57 AM  Result Value Ref Range   Glucose-Capillary 103 (H) 65 - 99 mg/dL   Comment 1 Notify RN    Comment 2 Document in Chart   Glucose, capillary     Status: Abnormal   Collection Time: 12/28/15  5:52 PM  Result Value Ref Range   Glucose-Capillary 116 (H) 65 - 99 mg/dL  Glucose, capillary     Status: None   Collection Time: 12/29/15 11:54 AM  Result Value Ref Range   Glucose-Capillary 88 65 - 99 mg/dL  Glucose, capillary     Status: None   Collection Time: 12/29/15  4:57 PM  Result Value Ref Range   Glucose-Capillary 99 65 - 99 mg/dL  Glucose, capillary     Status: None   Collection Time: 12/30/15  6:19 AM  Result Value Ref Range   Glucose-Capillary 91 65 - 99 mg/dL  Valproic acid level     Status: Abnormal   Collection Time: 12/30/15  6:41 AM  Result Value Ref Range   Valproic Acid Lvl 124 (H) 50.0 - 100.0 ug/mL    Comment: Performed at [facility]   Blood Alcohol level:  Lab Results  Component Value Date   [facility] <5 12/20/2015   ETH <5 123XX123   Metabolic Disorder Labs: Lab Results  Component Value Date   HGBA1C 5.6 07/25/2015   MPG 114 07/25/2015   MPG 137 02/01/2015   Lab Results  Component Value Date   PROLACTIN 46.5 (H) 07/25/2015   PROLACTIN 21.6 02/01/2015   Lab Results  Component Value Date   CHOL 135 07/25/2015   TRIG 59 07/25/2015   HDL 50 07/25/2015   CHOLHDL 2.7 07/25/2015   VLDL 12 07/25/2015   LDLCALC 73  07/25/2015   LDLCALC 78 02/01/2015   Physical Findings: AIMS: Facial and Oral Movements Muscles of Facial Expression: None, normal Lips and Perioral Area: None, normal Jaw: None, normal Tongue: None, normal,Extremity Movements Upper (arms, wrists, hands, fingers): None, normal Lower (legs, knees, ankles, toes): None, normal, Trunk Movements  Neck, shoulders, hips: None, normal, Overall Severity Severity of abnormal movements (highest score from questions above): None, normal Incapacitation due to abnormal movements: None, normal Patient's awareness of abnormal movements (rate only patient's report): No Awareness, Dental Status Current problems with teeth and/or dentures?: No Does patient usually wear dentures?: No  CIWA:    COWS:     Musculoskeletal: Strength & Muscle Tone: within normal limits Gait & Station: normal Patient leans: N/A  Psychiatric Specialty Exam: Physical Exam  Nursing note and vitals reviewed. Constitutional: She is oriented to person, place, and time. She appears well-developed.  Cardiovascular: Normal rate.   Neurological: She is alert and oriented to person, place, and time.  Psychiatric: She has a normal mood and affect. Her behavior is normal.    Review of Systems  Unable to perform ROS: Mental acuity  Psychiatric/Behavioral: Positive for depression. Negative for suicidal ideas. The patient is nervous/anxious.     Blood pressure 106/68, pulse (!) 107, temperature 98.1 F (36.7 C), resp. rate 18, height 5\' 4"  (1.626 m), weight 68 kg (150 lb), SpO2 100 %.Body mass index is 25.75 kg/m.  General Appearance: fairly groomed   Eye Contact:  Minimal   Speech: improved   Volume:  Soft   Mood:  Denies depression, but presents with blunted, restricted affect  Affect:  Blunted, flat  Thought Process:  Becomes disorganized at times   Orientation:  Other:  situation, place, person  Thought Content:  Denies hallucinations, does not currently present internally  preoccupied   Suicidal Thoughts:  Denies suicidal ideations  Homicidal Thoughts:  Denies homicidal   Memory: recent and remote fair   Judgement:  Fair   Insight:  Fair   Psychomotor Activity:  Restlessness  Concentration:  Concentration: Fair and Attention Span: Fair  Recall:  [employer] of Knowledge:  Fair  Language:  Fair  Akathisia:  No  Handed:  Right  AIMS (if indicated):     Assets:  Others:  access to healthcare  ADL's:  Impaired  Cognition:  WNL  Sleep:  Number of Hours: 4.75    I agree with current treatment plan on 12/30/2015 Patient seen face-to-face for psychiatric evaluation follow-up, chart reviewed. Reviewed the information documented and agree with the treatment plan.  Treatment Plan Summary: Schizoaffective disorder, bipolar type ([place])  Daily contact with patient to assess and evaluate symptoms and progress in treatment and Medication management   Encourage group and milieu participation to work on coping skills and symptom reduction For Psychosis: Continue  Abilify 15 mg po daily  For Mood lability: Continue Depakote  500 mg po bid. Depakote level on 12/25/15- 56, result reviewed (therapeutic) For anxiety/agitation: PRN medications as per agitation protocol. For insomnia (and depression ) Continue Remeron 7.5 mg po qhs. For Diabetes: Restarted  home medications. SSI /CBG as per unit protocol. Recheck BMP in AM, result pending. Treatment team working on disposition planning options   Derrill Center, NP,  12/30/2015, 9:57 AM   Agree with NP progress note as above

## 2015-12-31 LAB — GLUCOSE, CAPILLARY
GLUCOSE-CAPILLARY: 116 mg/dL — AB (ref 65–99)
Glucose-Capillary: 122 mg/dL — ABNORMAL HIGH (ref 65–99)

## 2015-12-31 MED ORDER — ARIPIPRAZOLE 10 MG PO TABS
20.0000 mg | ORAL_TABLET | Freq: Every day | ORAL | Status: DC
  Administered 2016-01-01 – 2016-01-02 (×2): 20 mg via ORAL
  Filled 2015-12-31 (×4): qty 2

## 2015-12-31 NOTE — Progress Notes (Signed)
Adult Psychoeducational Group Note  Date:  12/31/2015 Time:  12:40 AM  Group Topic/Focus:  Wrap-Up Group:   The focus of this group is to help patients review their daily goal of treatment and discuss progress on daily workbooks.   Participation Level:  Did Not Attend  Participation Quality:  Did Not Attend.  Affect:  Did Not Attend.  Cognitive:  Did Not Attend.  Insight: None  Engagement in Group:  Did Not Attend.  Modes of Intervention:  Discussion, Education and Support  Additional Comments:  Pt did not attend group session. Rocky Crafts 12/31/2015, 12:40 AM
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
## 2015-12-31 NOTE — Plan of Care (Signed)
Problem: Nutritional: Goal: Ability to achieve adequate nutritional intake will improve Outcome: Not Progressing Pt needs continuous reinforcement with regards to nutrition and diabetes and compliance.
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
## 2015-12-31 NOTE — Progress Notes (Signed)
D: Pt presented alert and responsive in a depressed flat mood. Refused to get out of bed to get her medications stating that she doesn't feel like it. Pt refused CBG stating that she "doesn't need all that." Pt displaying no s/s hyperglycemia or hypoglycemia. Vital signs are stable. Stayed in bed for most of the day and ate her food in her room. Consumed approximately 50% of lunch. Also refusing feeding supplement. Denies SI/HI/AVH/Pain with a shake of her head 'no' when asked.  A: MD notified about refusal of CBG and Insulin held via sliding scale. Emotional support, counseling and diabetes education given to pt. Safety checks maintained.  R: Pt did take all of her po meds and verbalized understanding regarding diabetes education.

## 2016-01-01 LAB — BASIC METABOLIC PANEL
ANION GAP: 8 (ref 5–15)
BUN: 9 mg/dL (ref 6–20)
CO2: 28 mmol/L (ref 22–32)
Calcium: 9.3 mg/dL (ref 8.9–10.3)
Chloride: 104 mmol/L (ref 101–111)
Creatinine, Ser: 0.77 mg/dL (ref 0.44–1.00)
GLUCOSE: 99 mg/dL (ref 65–99)
POTASSIUM: 3.5 mmol/L (ref 3.5–5.1)
Sodium: 140 mmol/L (ref 135–145)

## 2016-01-01 LAB — GLUCOSE, CAPILLARY
GLUCOSE-CAPILLARY: 83 mg/dL (ref 65–99)
GLUCOSE-CAPILLARY: 99 mg/dL (ref 65–99)
Glucose-Capillary: 112 mg/dL — ABNORMAL HIGH (ref 65–99)
Glucose-Capillary: 87 mg/dL (ref 65–99)

## 2016-01-01 NOTE — [facility] Group Notes (Signed)
Lead LCSW Group Therapy  01/01/2016 3:04 PM  Type of Therapy:  Group Therapy  Participation Level:  Did Not Attend  Modes of Intervention:  Discussion, Education, Socialization and Support  Summary of Progress/Problems: Patients identify obstacles, self-sabotaging and enabling behaviors. Patients explore aspects of self sabotage and enabling and how to limit these self-destructive behaviors in everyday life.  [place] MSW, LCSWA  01/01/2016, 3:04 PM

## 2016-01-01 NOTE — Progress Notes (Signed)
[name] [name] was talking with RN this morning about how she is going to manage her diabetes at home.  Encouraged her to talk with the doctor about a plan for managing her diabetes at home.  Her sugar was 83 this morning and RN encouraged her to go to the cafeteria to eat breakfast.  We will continue to monitor the progress towards her goals.
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
## 2016-01-01 NOTE — Progress Notes (Signed)
DAR Note: [name] [name] has been in her room all evening.  She did get up to take her HS medications.  She c/o of chest pain earlier and stated that she has been having this problem for over a year.  "I get it after I eat and I think that it is from indigestion."  PRN Maalox given with good relief.  She denied any SI/HI and admitted that she hears voices much of the time.  "I just listen to them."  She denied that they tell her to harm herself or others.  She denied any visual hallucinations.  Her HS blood sugar was 116 and no coverage was needed.  She turned in her self inventory and reported that her depression, hopelessness and anxiety were 0/10.  She reported that her goal for today was "less paranoia at night" and she will accomplish this goal by "eating less sugar, breads and carbs."  Encouraged her to participate in group and unit activities.  Q 15 minute checks maintained for safety.  We will continue to monitor the progress towards her goals.

## 2016-01-01 NOTE — Progress Notes (Signed)
Patient ID: [name] [name], female   DOB: Oct 12, 1980, 35 y.o.   MRN: [id number] PER STATE REGULATIONS 482.30  THIS CHART WAS REVIEWED FOR MEDICAL NECESSITY WITH RESPECT TO THE PATIENT'S ADMISSION/ DURATION OF STAY.  NEXT REVIEW DATE: 01/02/2016  Chauncy Lean, RN, BSN CASE MANAGER

## 2016-01-01 NOTE — Progress Notes (Signed)
Did not attend. 

## 2016-01-01 NOTE — Progress Notes (Signed)
DAR NOTE: Patient presents with flat affect and depressed mood.  Denies pain, auditory and visual hallucinations.  Rates depression at 0, hopelessness at 0, and anxiety at 0.  Maintained on routine safety checks.  Medications given as prescribed.  Support and encouragement offered as needed.  Attended group and participated.  Patient remained withdrawn and isolative.  Minimal interaction with staff.  Offered no complaint.

## 2016-01-01 NOTE — Progress Notes (Signed)
D: Pt was in bed in her room upon initial approach.  Pt presents with apprehensive, depressed affect and suspicious, depressed mood.  When asked about her goal, pt states "I had a personal goal I was working towards and I met it." Pt denies SI/HI, denies pain.  Pt did not report having hallucinations.  Pt has been isolative to her room tonight.  She did not attend evening group. A: Introduced self to pt.  Actively listened to pt and offered support and encouragement. Medications administered per order.  Pt asks "are these the same meds I took last night?" prior to taking medications.  Medication education provided.   R: Pt is safe on the unit.  Pt is compliant with medications.  Pt verbally contracts for safety.  Will continue to monitor and assess.

## 2016-01-01 NOTE — Progress Notes (Signed)
Recreation Therapy Notes  Date: 01/01/16 Time: 1000 Location: 500 Hall Dayroom  Group Topic: Leisure Education  Goal Area(s) Addresses:  Patient will identify positive leisure activities.  Patient will identify one positive benefit of participation in leisure activities.   Intervention: [employer], various sports equipment, paper to write on  Activity: Gamin.  LRT put various sports equipment and random objects into a bag.  Each person in the group was to pick something from the bag.  The group would then have 20 minutes to make up a game, give it a name, create the rules and use all the equipment they selected.  At the end, they would then have to teach the other group how to play their game.  Education:  Leisure Education, Dentist  Education Outcome: Acknowledges education/In group clarification offered/Needs additional education  Clinical Observations/Feedback: Pt did not attend group.   Victorino Sparrow, LRT/CTRS         Ria Comment, Rinda Rollyson A 01/01/2016 12:00 PM

## 2016-01-02 LAB — GLUCOSE, CAPILLARY
GLUCOSE-CAPILLARY: 86 mg/dL (ref 65–99)
Glucose-Capillary: 95 mg/dL (ref 65–99)
Glucose-Capillary: 89 mg/dL (ref 65–99)

## 2016-01-02 MED ORDER — ARIPIPRAZOLE 15 MG PO TABS
25.0000 mg | ORAL_TABLET | Freq: Every day | ORAL | Status: DC
  Filled 2016-01-02: qty 1

## 2016-01-02 MED ORDER — ARIPIPRAZOLE 15 MG PO TABS: 25.0000 mg | ORAL_TABLET | Freq: Every day | ORAL | Status: DC

## 2016-01-02 MED ORDER — ZOLPIDEM TARTRATE 5 MG PO TABS
5.0000 mg | ORAL_TABLET | Freq: Every day | ORAL | Status: DC
  Administered 2016-01-02: 5 mg via ORAL
  Filled 2016-01-02: qty 1

## 2016-01-02 MED ORDER — MIRTAZAPINE 15 MG PO TABS
15.0000 mg | ORAL_TABLET | Freq: Every day | ORAL | Status: DC
  Administered 2016-01-02: 15 mg via ORAL
  Filled 2016-01-02 (×2): qty 1

## 2016-01-02 NOTE — Progress Notes (Signed)
Recreation Therapy Notes  Date: 01/02/16 Time: 1000 Location: 500 Hall Dayroom  Group Topic: Self-Esteem  Goal Area(s) Addresses:  Patient will identify positive ways to increase self-esteem. Patient will verbalize benefit of increased self-esteem.  Intervention: Worksheet with mirror outline, writing utencils  Activity: Geologist, engineering, Geologist, engineering.  Patients were given a worksheet with an outline of a mirror on it.  Patients were to write or draw how they see themselves at this moment.  Once patients identified how they viewed themselves, they were to identify any areas where they felt they could use some improvement and what steps they could take to improve in those areas.  Education: Self-Esteem, Dentist.   Education Outcome: Acknowledges education/In group clarification offered/Needs additional education  Clinical Observations/Feedback: Pt did not attend group.   Victorino Sparrow, LRT/CTRS         Victorino Sparrow A 01/02/2016 12:10 PM

## 2016-01-02 NOTE — Progress Notes (Signed)
Patient ID: [name] [name], female   DOB: [birth year], 35 y.o.   MRN: [id number] PER STATE REGULATIONS 482.30  THIS CHART WAS REVIEWED FOR MEDICAL NECESSITY WITH RESPECT TO THE PATIENT'S ADMISSION/ DURATION OF STAY.  NEXT REVIEW DATE: 01/06/2016  Chauncy Lean, RN, BSN CASE MANAGER

## 2016-01-02 NOTE — Progress Notes (Signed)
Adult Psychoeducational Group Note  Date:  01/02/2016 Time:  9:33 PM  Group Topic/Focus:  Wrap-Up Group:   The focus of this group is to help patients review their daily goal of treatment and discuss progress on daily workbooks.   Participation Level:  Did Not Attend  Participation Quality:  Did not attend  Affect:  Did not attend  Cognitive:  Did not attend  Insight: None  Engagement in Group:  Did not attend  Modes of Intervention:  Did not attend  Additional Comments:  Patient did not attend wrap up group tonight.  [name] L [name] 01/02/2016, 9:33 PM

## 2016-01-02 NOTE — Progress Notes (Signed)
DAR NOTE: Patient presents with flat affect and depressed mood.  Denies pain, auditory and visual hallucinations.  Described energy level as low and concentration as poor.  Rates depression at 2, hopelessness at 1, and anxiety at 1.  Maintained on routine safety checks.  Medications given as prescribed.  Support and encouragement offered as needed.  States goal for today is "go to groups."  Patient remained withdrawn and isolative.  Minimal interaction with staff.  Offered no complaint.

## 2016-01-02 NOTE — Progress Notes (Signed)
[facility] MD Progress Note  01/02/2016 1:02 PM [name] [name]  MRN:  [id number] Subjective: Patient states " I still cannot sleep. I am still paranoid - I will not go to groups."  Objective: Patient seen and chart reviewed.Discussed patient with treatment team.  Pt today seen in her room , awake, however avoids eye contact the entire assessment session. Pt sat down with her head bowed down . Pt attempted to answer questions appropriately. Pt seen as delusional, paranoid. Also reports sleep issues. Per RN - pt with lot of negative sx - refuses to participate in milieu. Pt takes medications only with a lot of encouragement. Will continue to need support.   Principal Problem: Schizoaffective disorder, bipolar type ([place]) Diagnosis:   Patient Active Problem List   Diagnosis Date Noted  . Insomnia [G47.00]   . Anxiety state [F41.1]   . Overactive bladder [N32.81]   . Diabetes mellitus ([place]) [E11.9] 02/08/2015  . Schizoaffective disorder, bipolar type ([place]) [F25.0] 01/28/2015  . Non compliance w medication regimen [Z91.14]    Total Time spent with patient: 25 minutes  Past Psychiatric History: Please see H&P.  Past Medical History:  Past Medical History:  Diagnosis Date  . Bipolar affective disorder, currently manic, mild ([place])   . Diabetes mellitus without complication ([place])   . Schizophrenia ([place])    History reviewed. No pertinent surgical history. Family History:  Family History  Problem Relation Age of Onset  . Drug abuse Maternal Uncle    Family Psychiatric  History: Please see H&P.  Social History:  History  Alcohol Use No     History  Drug Use No    Social History   Social History  . Marital status: Legally Separated    Spouse name: N/A  . Number of children: N/A  . Years of education: N/A   Social History Main Topics  . Smoking status: Never Smoker  . Smokeless tobacco: Never Used  . Alcohol use No  . Drug use: No  . Sexual activity: Not Currently    Other Topics Concern  . None   Social History Narrative  . None   Additional Social History:    Pain Medications: See MAR  Prescriptions: See MAR Over the Counter: See MAR History of alcohol / drug use?: No history of alcohol / drug abuse                    Sleep: Poor  Appetite:  Poor  Current Medications: Current Facility-Administered Medications  Medication Dose Route Frequency Provider Last Rate Last Dose  . acetaminophen (TYLENOL) tablet 650 mg  650 mg Oral Q6H PRN Benjamine Mola, FNP      . alum & mag hydroxide-simeth (MAALOX/MYLANTA) 200-200-20 MG/5ML suspension 30 mL  30 mL Oral Q4H PRN Benjamine Mola, FNP   30 mL at 12/31/15 2032  . [START ON 01/03/2016] ARIPiprazole (ABILIFY) tablet 25 mg  25 mg Oral QPC lunch Hervey Wedig, MD      . divalproex (DEPAKOTE) DR tablet 500 mg  500 mg Oral Q12H Lynard Postlewait, MD   500 mg at 01/02/16 0754  . feeding supplement (GLUCERNA SHAKE) (GLUCERNA SHAKE) liquid 237 mL  237 mL Oral TID BM Reginald Mangels, MD   237 mL at 01/01/16 2106  . hydrOXYzine (ATARAX/VISTARIL) tablet 25 mg  25 mg Oral TID PRN Benjamine Mola, FNP   25 mg at 12/22/15 2125  . insulin aspart (novoLOG) injection 0-15 Units  0-15 Units Subcutaneous TID WC  Ursula Alert, MD   0 Units at 12/26/15 1225  . insulin aspart (novoLOG) injection 0-5 Units  0-5 Units Subcutaneous QHS Ursula Alert, MD   Stopped at 12/23/15 2203  . magnesium hydroxide (MILK OF MAGNESIA) suspension 30 mL  30 mL Oral Daily PRN Benjamine Mola, FNP      . metFORMIN (GLUCOPHAGE) tablet 500 mg  500 mg Oral Q breakfast Benjamine Mola, FNP   500 mg at 01/02/16 0755  . mirtazapine (REMERON) tablet 15 mg  15 mg Oral QHS Sitlali Koerner, MD      . OLANZapine (ZYPREXA) tablet 10 mg  10 mg Oral TID PRN Ursula Alert, MD       Or  . OLANZapine (ZYPREXA) injection 10 mg  10 mg Intramuscular TID PRN Ursula Alert, MD      . oxybutynin (DITROPAN-XL) 24 hr tablet 10 mg  10 mg Oral QHS Benjamine Mola,  FNP   10 mg at 01/01/16 2105  . zolpidem (AMBIEN) tablet 5 mg  5 mg Oral QHS Ursula Alert, MD        Lab Results:  Results for orders placed or performed during the hospital encounter of 12/21/15 (from the past 48 hour(s))  Glucose, capillary     Status: Abnormal   Collection Time: 12/31/15  4:53 PM  Result Value Ref Range   Glucose-Capillary 122 (H) 65 - 99 mg/dL   Comment 1 Notify RN   Glucose, capillary     Status: Abnormal   Collection Time: 12/31/15  9:09 PM  Result Value Ref Range   Glucose-Capillary 116 (H) 65 - 99 mg/dL  Glucose, capillary     Status: None   Collection Time: 01/01/16  6:10 AM  Result Value Ref Range   Glucose-Capillary 83 65 - 99 mg/dL  Basic metabolic panel     Status: None   Collection Time: 01/01/16  6:33 AM  Result Value Ref Range   Sodium 140 135 - 145 mmol/L   Potassium 3.5 3.5 - 5.1 mmol/L   Chloride 104 101 - 111 mmol/L   CO2 28 22 - 32 mmol/L   Glucose, Bld 99 65 - 99 mg/dL   BUN 9 6 - 20 mg/dL   Creatinine, Ser 0.77 0.44 - 1.00 mg/dL   Calcium 9.3 8.9 - 10.3 mg/dL   GFR calc non Af Amer >60 >60 mL/min   GFR calc Af Amer >60 >60 mL/min    Comment: (NOTE) The eGFR has been calculated using the CKD EPI equation. This calculation has not been validated in all clinical situations. eGFR's persistently <60 mL/min signify possible Chronic Kidney Disease.    Anion gap 8 5 - 15    Comment: Performed at [facility]  Glucose, capillary     Status: None   Collection Time: 01/01/16 11:48 AM  Result Value Ref Range   Glucose-Capillary 99 65 - 99 mg/dL   Comment 1 Notify RN    Comment 2 Document in Chart   Glucose, capillary     Status: Abnormal   Collection Time: 01/01/16  5:01 PM  Result Value Ref Range   Glucose-Capillary 112 (H) 65 - 99 mg/dL   Comment 1 Notify RN    Comment 2 Document in Chart   Glucose, capillary     Status: None   Collection Time: 01/01/16  8:14 PM  Result Value Ref Range   Glucose-Capillary 87 65 -  99 mg/dL  Glucose, capillary     Status: None  Collection Time: 01/02/16  6:21 AM  Result Value Ref Range   Glucose-Capillary 86 65 - 99 mg/dL  Glucose, capillary     Status: None   Collection Time: 01/02/16 12:06 PM  Result Value Ref Range   Glucose-Capillary 95 65 - 99 mg/dL   Comment 1 Notify RN    Comment 2 Document in Chart     Blood Alcohol level:  Lab Results  Component Value Date   ETH <5 12/20/2015   ETH <5 70/35/0093    Metabolic Disorder Labs: Lab Results  Component Value Date   HGBA1C 5.6 07/25/2015   MPG 114 07/25/2015   MPG 137 02/01/2015   Lab Results  Component Value Date   PROLACTIN 46.5 (H) 07/25/2015   PROLACTIN 21.6 02/01/2015   Lab Results  Component Value Date   CHOL 135 07/25/2015   TRIG 59 07/25/2015   HDL 50 07/25/2015   CHOLHDL 2.7 07/25/2015   VLDL 12 07/25/2015   LDLCALC 73 07/25/2015   LDLCALC 78 02/01/2015    Physical Findings: AIMS: Facial and Oral Movements Muscles of Facial Expression: None, normal Lips and Perioral Area: None, normal Jaw: None, normal Tongue: None, normal,Extremity Movements Upper (arms, wrists, hands, fingers): None, normal Lower (legs, knees, ankles, toes): None, normal, Trunk Movements Neck, shoulders, hips: None, normal, Overall Severity Severity of abnormal movements (highest score from questions above): None, normal Incapacitation due to abnormal movements: None, normal Patient's awareness of abnormal movements (rate only patient's report): No Awareness, Dental Status Current problems with teeth and/or dentures?: No Does patient usually wear dentures?: No  CIWA:    COWS:     Musculoskeletal: Strength & Muscle Tone: within normal limits Gait & Station: normal Patient leans: N/A  Psychiatric Specialty Exam: Physical Exam  Nursing note and vitals reviewed.   Review of Systems  Psychiatric/Behavioral: Positive for depression. The patient is nervous/anxious and has insomnia.   All other systems  reviewed and are negative.   Blood pressure 108/75, pulse (!) 102, temperature 98.5 F (36.9 C), temperature source Oral, resp. rate 16, height '5\' 4"'  (1.626 m), weight 68 kg (150 lb), SpO2 100 %.Body mass index is 25.75 kg/m.  General Appearance: Guarded  Eye Contact:  None  Speech:  Normal Rate  Volume:  Decreased  Mood:  Anxious, Depressed and Irritable  Affect:  Constricted  Thought Process:  Goal Directed and Descriptions of Associations: Circumstantial  Orientation:  Other:  place, situation  Thought Content:  Delusions, Paranoid Ideation and Rumination  Suicidal Thoughts:  No  Homicidal Thoughts:  No  Memory:  Immediate;   Fair Recent;   Fair Remote;   Poor  Judgement:  Impaired  Insight:  Shallow  Psychomotor Activity:  Decreased  Concentration:  Concentration: Fair and Attention Span: Fair  Recall:  [employer] of Knowledge:  Fair  Language:  Fair  Akathisia:  No  Handed:  Right  AIMS (if indicated):     Assets:  Desire for Improvement  ADL's:  Intact  Cognition:  WNL  Sleep:  Number of Hours: 4.75     Treatment Plan Summary:Patient seen as paranoid , guarded , has negative sx- has sleep issues - will continue treatment.  Schizoaffective disorder, bipolar type ([place]) unstable - improving  Will continue today 01/02/16  plan as below except where it is noted.   Daily contact with patient to assess and evaluate symptoms and progress in treatment and Medication management For Psychosis: Increase Abilify to 25 mg po daily  For Mood lability: Continue  Depakote  500 mg po bid.Depakote level ordered for tomorrow - 01/03/16  For anxiety/agitation: PRN medications as per agitation protocol.  For insomnia (and depression ) Increase Remeron to 15 mg po qhs. Add Ambien 5 mg po qhs.   Will continue home medications as indicated.  CSW will continue to work on disposition.   I certify that the services received since the previous  certification/recertification were and continue to be medically necessary as the treatment provided can be reasonably expected to improve the patient's condition; the medical record documents that the services furnished were intensive treatment services or their equivalent services, and this patient continues to need, on a daily basis, active treatment furnished directly by or requiring the supervision of inpatient psychiatric personnel.    Cavion Faiola, MD 01/02/2016, 1:02 PM

## 2016-01-02 NOTE — Plan of Care (Signed)
Problem: Health Behavior/Discharge Planning: Goal: Compliance with prescribed medication regimen will improve Outcome: Progressing Pt was compliant with scheduled medications tonight.

## 2016-01-02 NOTE — BHH Group Notes (Signed)
[place] LCSW Group Therapy  01/02/2016 4:33 PM  Type of Therapy:  Group Therapy  Participation Level:  Did not attend   Modes of Intervention:  Discussion, Education, Socialization and Support  Summary of Progress/Problems: Balance in life: Patients will discuss the concept of balance and how it looks and feels to be unbalanced. Pt will identify areas in their life that is unbalanced and ways to become more balanced.    [place] MSW, LCSWA  01/02/2016, 4:33 PM

## 2016-01-02 NOTE — Progress Notes (Signed)
D: Pt was in the hallway upon initial approach.  Pt presents with depressed affect and mood.  She reports "I'm just ready to go."  Pt denies SI/HI, denies hallucinations, denies pain.  Pt has been isolative to her room for the majority of the night; did not attend evening group.   A: Introduced self to pt.  Actively listened to pt and offered support and encouragement. Medications administered per order.  Medication education provided. R: Pt is safe on the unit.  Pt is compliant with medications.  Pt verbally contracts for safety.  Will continue to monitor and assess.

## 2016-01-03 LAB — GLUCOSE, CAPILLARY
GLUCOSE-CAPILLARY: 72 mg/dL (ref 65–99)
GLUCOSE-CAPILLARY: 93 mg/dL (ref 65–99)
Glucose-Capillary: 82 mg/dL (ref 65–99)
Glucose-Capillary: 102 mg/dL — ABNORMAL HIGH (ref 65–99)

## 2016-01-03 LAB — VALPROIC ACID LEVEL: Valproic Acid Lvl: 107 ug/mL — ABNORMAL HIGH (ref 50.0–100.0)

## 2016-01-03 MED ORDER — DIVALPROEX SODIUM 250 MG PO DR TAB
250.0000 mg | DELAYED_RELEASE_TABLET | Freq: Three times a day (TID) | ORAL | Status: DC
  Administered 2016-01-03 – 2016-01-04 (×2): 250 mg via ORAL
  Filled 2016-01-03 (×6): qty 1

## 2016-01-03 MED ORDER — ARIPIPRAZOLE 15 MG PO TABS
25.0000 mg | ORAL_TABLET | Freq: Every day | ORAL | Status: AC
  Administered 2016-01-03: 13:00:00 25 mg via ORAL
  Filled 2016-01-03: qty 1

## 2016-01-03 MED ORDER — ADULT MULTIVITAMIN W/MINERALS CH
1.0000 | ORAL_TABLET | Freq: Every day | ORAL | Status: DC
  Administered 2016-01-03 – 2016-01-09 (×6): 1 via ORAL
  Filled 2016-01-03 (×9): qty 1

## 2016-01-03 MED ORDER — ZOLPIDEM TARTRATE 10 MG PO TABS
10.0000 mg | ORAL_TABLET | Freq: Every day | ORAL | Status: DC
  Administered 2016-01-03 – 2016-01-05 (×3): 10 mg via ORAL
  Filled 2016-01-03 (×3): qty 1

## 2016-01-03 MED ORDER — ARIPIPRAZOLE 15 MG PO TABS
30.0000 mg | ORAL_TABLET | Freq: Every day | ORAL | Status: DC
  Filled 2016-01-03: qty 2

## 2016-01-03 NOTE — Progress Notes (Signed)
[facility] MD Progress Note  01/03/2016 10:32 AM [name] [name]  MRN:  [id number] Subjective: Patient states " I feel tired , I am not sure what is causing it."   Objective: Patient seen and chart reviewed.Discussed patient with treatment team.  Pt today seen in bed - avoids eye contact . Pt continues to have limited participation in milieu , but per RN - this AM was up and was more visible in milieu. Pt continues to be paranoid - refuses to attend groups . Pt continues to need a lot of encouragement and support.    Principal Problem: Schizoaffective disorder, bipolar type ([place]) Diagnosis:   Patient Active Problem List   Diagnosis Date Noted  . Insomnia [G47.00]   . Anxiety state [F41.1]   . Overactive bladder [N32.81]   . Diabetes mellitus ([place]) [E11.9] 02/08/2015  . Schizoaffective disorder, bipolar type ([place]) [F25.0] 01/28/2015  . Non compliance w medication regimen [Z91.14]    Total Time spent with patient: 20 minutes  Past Psychiatric History: Please see H&P.  Past Medical History:  Past Medical History:  Diagnosis Date  . Bipolar affective disorder, currently manic, mild ([place])   . Diabetes mellitus without complication ([place])   . Schizophrenia ([place])    History reviewed. No pertinent surgical history. Family History:  Family History  Problem Relation Age of Onset  . Drug abuse Maternal Uncle    Family Psychiatric  History: Please see H&P.  Social History:  History  Alcohol Use No     History  Drug Use No    Social History   Social History  . Marital status: Legally Separated    Spouse name: N/A  . Number of children: N/A  . Years of education: N/A   Social History Main Topics  . Smoking status: Never Smoker  . Smokeless tobacco: Never Used  . Alcohol use No  . Drug use: No  . Sexual activity: Not Currently   Other Topics Concern  . None   Social History Narrative  . None   Additional Social History:    Pain Medications: See MAR   Prescriptions: See MAR Over the Counter: See MAR History of alcohol / drug use?: No history of alcohol / drug abuse                    Sleep: Fair  Appetite:  Fair  Current Medications: Current Facility-Administered Medications  Medication Dose Route Frequency Provider Last Rate Last Dose  . acetaminophen (TYLENOL) tablet 650 mg  650 mg Oral Q6H PRN Benjamine Mola, FNP      . alum & mag hydroxide-simeth (MAALOX/MYLANTA) 200-200-20 MG/5ML suspension 30 mL  30 mL Oral Q4H PRN Benjamine Mola, FNP   30 mL at 12/31/15 2032  . ARIPiprazole (ABILIFY) tablet 25 mg  25 mg Oral QPC lunch Kinzly Pierrelouis, MD      . divalproex (DEPAKOTE) DR tablet 250 mg  250 mg Oral Q8H Taj Arteaga, MD      . feeding supplement (GLUCERNA SHAKE) (GLUCERNA SHAKE) liquid 237 mL  237 mL Oral TID BM Ursula Alert, MD   237 mL at 01/03/16 0838  . hydrOXYzine (ATARAX/VISTARIL) tablet 25 mg  25 mg Oral TID PRN Benjamine Mola, FNP   25 mg at 12/22/15 2125  . insulin aspart (novoLOG) injection 0-15 Units  0-15 Units Subcutaneous TID WC Ursula Alert, MD   0 Units at 12/26/15 1225  . insulin aspart (novoLOG) injection 0-5 Units  0-5 Units Subcutaneous  QHS Ursula Alert, MD   Stopped at 12/23/15 2203  . magnesium hydroxide (MILK OF MAGNESIA) suspension 30 mL  30 mL Oral Daily PRN Benjamine Mola, FNP      . metFORMIN (GLUCOPHAGE) tablet 500 mg  500 mg Oral Q breakfast Benjamine Mola, FNP   500 mg at 01/03/16 0836  . mirtazapine (REMERON) tablet 15 mg  15 mg Oral QHS Ursula Alert, MD   15 mg at 01/02/16 2117  . multivitamin with minerals tablet 1 tablet  1 tablet Oral Daily Vir Whetstine, MD      . OLANZapine (ZYPREXA) tablet 10 mg  10 mg Oral TID PRN Ursula Alert, MD       Or  . OLANZapine (ZYPREXA) injection 10 mg  10 mg Intramuscular TID PRN Ursula Alert, MD      . oxybutynin (DITROPAN-XL) 24 hr tablet 10 mg  10 mg Oral QHS Benjamine Mola, FNP   10 mg at 01/02/16 2120  . zolpidem (AMBIEN) tablet 5 mg  5  mg Oral QHS Ursula Alert, MD   5 mg at 01/02/16 2200    Lab Results:  Results for orders placed or performed during the hospital encounter of 12/21/15 (from the past 48 hour(s))  Glucose, capillary     Status: None   Collection Time: 01/01/16 11:48 AM  Result Value Ref Range   Glucose-Capillary 99 65 - 99 mg/dL   Comment 1 Notify RN    Comment 2 Document in Chart   Glucose, capillary     Status: Abnormal   Collection Time: 01/01/16  5:01 PM  Result Value Ref Range   Glucose-Capillary 112 (H) 65 - 99 mg/dL   Comment 1 Notify RN    Comment 2 Document in Chart   Glucose, capillary     Status: None   Collection Time: 01/01/16  8:14 PM  Result Value Ref Range   Glucose-Capillary 87 65 - 99 mg/dL  Glucose, capillary     Status: None   Collection Time: 01/02/16  6:21 AM  Result Value Ref Range   Glucose-Capillary 86 65 - 99 mg/dL  Glucose, capillary     Status: None   Collection Time: 01/02/16 12:06 PM  Result Value Ref Range   Glucose-Capillary 95 65 - 99 mg/dL   Comment 1 Notify RN    Comment 2 Document in Chart   Glucose, capillary     Status: None   Collection Time: 01/02/16  5:19 PM  Result Value Ref Range   Glucose-Capillary 89 65 - 99 mg/dL   Comment 1 Notify RN    Comment 2 Document in Chart   Glucose, capillary     Status: None   Collection Time: 01/03/16  6:13 AM  Result Value Ref Range   Glucose-Capillary 82 65 - 99 mg/dL  Valproic acid level     Status: Abnormal   Collection Time: 01/03/16  6:28 AM  Result Value Ref Range   Valproic Acid Lvl 107 (H) 50.0 - 100.0 ug/mL    Comment: Performed at [facility]    Blood Alcohol level:  Lab Results  Component Value Date   [facility] <5 12/20/2015   ETH <5 123XX123    Metabolic Disorder Labs: Lab Results  Component Value Date   HGBA1C 5.6 07/25/2015   MPG 114 07/25/2015   MPG 137 02/01/2015   Lab Results  Component Value Date   PROLACTIN 46.5 (H) 07/25/2015   PROLACTIN 21.6 02/01/2015    Lab  Results  Component Value Date   CHOL 135 07/25/2015   TRIG 59 07/25/2015   HDL 50 07/25/2015   CHOLHDL 2.7 07/25/2015   VLDL 12 07/25/2015   LDLCALC 73 07/25/2015   LDLCALC 78 02/01/2015    Physical Findings: AIMS: Facial and Oral Movements Muscles of Facial Expression: None, normal Lips and Perioral Area: None, normal Jaw: None, normal Tongue: None, normal,Extremity Movements Upper (arms, wrists, hands, fingers): None, normal Lower (legs, knees, ankles, toes): None, normal, Trunk Movements Neck, shoulders, hips: None, normal, Overall Severity Severity of abnormal movements (highest score from questions above): None, normal Incapacitation due to abnormal movements: None, normal Patient's awareness of abnormal movements (rate only patient's report): No Awareness, Dental Status Current problems with teeth and/or dentures?: No Does patient usually wear dentures?: No  CIWA:    COWS:     Musculoskeletal: Strength & Muscle Tone: within normal limits Gait & Station: normal Patient leans: N/A  Psychiatric Specialty Exam: Physical Exam  Nursing note and vitals reviewed.   Review of Systems  Musculoskeletal: Positive for myalgias.  Psychiatric/Behavioral: Positive for depression. The patient is nervous/anxious.   All other systems reviewed and are negative.   Blood pressure 108/75, pulse (!) 102, temperature 98.5 F (36.9 C), temperature source Oral, resp. rate 16, height 5\' 4"  (1.626 m), weight 68 kg (150 lb), SpO2 100 %.Body mass index is 25.75 kg/m.  General Appearance: Guarded  Eye Contact:  Poor  Speech:  Normal Rate  Volume:  Normal  Mood:  Anxious and Depressed  Affect:  Constricted  Thought Process:  Goal Directed and Descriptions of Associations: Circumstantial  Orientation:  Other:  place, situation  Thought Content:  Delusions, Paranoid Ideation and Rumination  Suicidal Thoughts:  No  Homicidal Thoughts:  No  Memory:  Immediate;   Fair Recent;    Fair Remote;   Fair  Judgement:  Impaired  Insight:  Shallow  Psychomotor Activity:  Decreased  Concentration:  Concentration: Fair and Attention Span: Fair  Recall:  [employer] of Knowledge:  Fair  Language:  Fair  Akathisia:  No  Handed:  Right  AIMS (if indicated):     Assets:  Desire for Improvement  ADL's:  Intact  Cognition:  WNL  Sleep:  Number of Hours: 1     Treatment Plan Summary:Patient continues to be seen as paranoid , depressed - seen in bed mostly , although staff noted her as more visible in milieu early this AM. will continue treatment.  Schizoaffective disorder, bipolar type ([place]) unstable - improving  Will continue today 01/03/16  plan as below except where it is noted.   Daily contact with patient to assess and evaluate symptoms and progress in treatment and Medication management For Psychosis: Increase Abilify to 30 mg po daily   For Mood lability: Will reduce Depakote to 250 mg po q8h . Depakote level this AM - 01/03/16- 107.   For anxiety/agitation: PRN medications as per agitation protocol.  For insomnia (and depression ) Will discontinue Remeron for lack of efficacy. Will increase Ambien to 10 mg po qhs.   Will continue home medications as indicated.  CSW will continue to work on disposition.   I certify that the services received since the previous certification/recertification were and continue to be medically necessary as the treatment provided can be reasonably expected to improve the patient's condition; the medical record documents that the services furnished were intensive treatment services or their equivalent services, and this patient continues to need, on  a daily basis, active treatment furnished directly by or requiring the supervision of inpatient psychiatric personnel.    Tranell Wojtkiewicz, MD 01/03/2016, 10:32 AM

## 2016-01-03 NOTE — Progress Notes (Signed)
DAR NOTE: Patient presents with anxious affect and depressed mood.  Denies pain, auditory and visual hallucinations.  Rates depression at 0, hopelessness at 0, and anxiety at 0.  Maintained on routine safety checks.  Medications given as prescribed.  Support and encouragement offered as needed.  Patient remained withdrawn and isolative.  Offered no complaint.

## 2016-01-03 NOTE — BHH Group Notes (Signed)
[place] LCSW Group Therapy  01/03/2016 4:10 PM  Type of Therapy:  Group Therapy  Participation Level:  Did Not Attend  Modes of Intervention:  Discussion, Education, Socialization and Support  Summary of Progress/Problems: Feelings around Relapse. Group members discussed the meaning of relapse and shared personal stories of relapse, how it affected them and others, and how they perceived themselves during this time. Group members were encouraged to identify triggers, warning signs and coping skills used when facing the possibility of relapse. Social supports were discussed and explored in detail.  [place] MSW, LCSWA  01/03/2016, 4:10 PM

## 2016-01-03 NOTE — Progress Notes (Signed)
Recreation Therapy Notes  Date: 01/03/16 Time: 1000 Location: 500 Hall Dayroom  Group Topic: Communication  Goal Area(s) Addresses:  Patient will effectively communicate with peers in group.  Patient will verbalize benefit of healthy communication. Patient will verbalize positive effect of healthy communication on post d/c goals.  Patient will identify communication techniques that made activity effective for group.   Intervention: Giant beach ball, chairs  Activity: Keep It Going Volleyball.  Patients were arranged in a circle.  Patients were to hit a giant beach ball to each like you would in volleyball.  Patients were to hit the ball as many times as they could without it rolling to a stop on the floor.  The ball could bounce off of the floor but not stop.  The group was trying to beat the record set by a previous group of 650.  Patients were to use teamwork, communication and strategies to complete the activity.  Education: Communication, Discharge Planning  Education Outcome: Acknowledges understanding/In group clarification offered/Needs additional education.   Clinical Observations/Feedback:  Pt did not attend group.   Victorino Sparrow, LRT/CTRS         Victorino Sparrow A 01/03/2016 12:19 PM

## 2016-01-03 NOTE — Progress Notes (Signed)
Pt did not attend Music Therapy this evening.  

## 2016-01-04 LAB — GLUCOSE, CAPILLARY
GLUCOSE-CAPILLARY: 124 mg/dL — AB (ref 65–99)
Glucose-Capillary: 92 mg/dL (ref 65–99)
Glucose-Capillary: 104 mg/dL — ABNORMAL HIGH (ref 65–99)

## 2016-01-04 MED ORDER — ARIPIPRAZOLE 15 MG PO TABS
30.0000 mg | ORAL_TABLET | Freq: Every day | ORAL | Status: DC
  Administered 2016-01-04 – 2016-01-05 (×2): 30 mg via ORAL
  Filled 2016-01-04 (×3): qty 2

## 2016-01-04 NOTE — Progress Notes (Signed)
[facility] MD Progress Note  01/04/2016 12:21 PM [name] [name]  MRN:  [id number] Subjective: Patient states " I do not want depakote , I will just take the abilify. I do not want to be tired all day.'    Objective: Patient seen and chart reviewed.Discussed patient with treatment team.  Pt seen as awake and more visible in milieu. Pt continues to avoid eye contact when speaking to Probation officer. Pt continues to report tiredness during the day. Pt feels it is caused by her depakote and reports she does not want to be on it. Discussed that she still needs to continue Abilify. Pt continues to need encouragement and support.     Principal Problem: Schizoaffective disorder, bipolar type ([place]) Diagnosis:   Patient Active Problem List   Diagnosis Date Noted  . Insomnia [G47.00]   . Anxiety state [F41.1]   . Overactive bladder [N32.81]   . Diabetes mellitus ([place]) [E11.9] 02/08/2015  . Schizoaffective disorder, bipolar type ([place]) [F25.0] 01/28/2015  . Non compliance w medication regimen [Z91.14]    Total Time spent with patient: 20 minutes  Past Psychiatric History: Please see H&P.  Past Medical History:  Past Medical History:  Diagnosis Date  . Bipolar affective disorder, currently manic, mild ([place])   . Diabetes mellitus without complication ([place])   . Schizophrenia ([place])    History reviewed. No pertinent surgical history. Family History:  Family History  Problem Relation Age of Onset  . Drug abuse Maternal Uncle    Family Psychiatric  History: Please see H&P.  Social History:  History  Alcohol Use No     History  Drug Use No    Social History   Social History  . Marital status: Legally Separated    Spouse name: N/A  . Number of children: N/A  . Years of education: N/A   Social History Main Topics  . Smoking status: Never Smoker  . Smokeless tobacco: Never Used  . Alcohol use No  . Drug use: No  . Sexual activity: Not Currently   Other Topics Concern  . None    Social History Narrative  . None   Additional Social History:    Pain Medications: See MAR  Prescriptions: See MAR Over the Counter: See MAR History of alcohol / drug use?: No history of alcohol / drug abuse                    Sleep: Fair  Appetite:  Fair  Current Medications: Current Facility-Administered Medications  Medication Dose Route Frequency Provider Last Rate Last Dose  . acetaminophen (TYLENOL) tablet 650 mg  650 mg Oral Q6H PRN Benjamine Mola, FNP      . alum & mag hydroxide-simeth (MAALOX/MYLANTA) 200-200-20 MG/5ML suspension 30 mL  30 mL Oral Q4H PRN Benjamine Mola, FNP   30 mL at 12/31/15 2032  . ARIPiprazole (ABILIFY) tablet 30 mg  30 mg Oral Q supper Kamora Vossler, MD      . feeding supplement (GLUCERNA SHAKE) (GLUCERNA SHAKE) liquid 237 mL  237 mL Oral TID BM Clearance Chenault, MD   237 mL at 01/04/16 0933  . hydrOXYzine (ATARAX/VISTARIL) tablet 25 mg  25 mg Oral TID PRN Benjamine Mola, FNP   25 mg at 12/22/15 2125  . insulin aspart (novoLOG) injection 0-15 Units  0-15 Units Subcutaneous TID WC Ursula Alert, MD   0 Units at 12/26/15 1225  . insulin aspart (novoLOG) injection 0-5 Units  0-5 Units Subcutaneous QHS Ariq Khamis,  MD   Stopped at 12/23/15 2203  . magnesium hydroxide (MILK OF MAGNESIA) suspension 30 mL  30 mL Oral Daily PRN Benjamine Mola, FNP      . metFORMIN (GLUCOPHAGE) tablet 500 mg  500 mg Oral Q breakfast Benjamine Mola, FNP   500 mg at 01/04/16 [id number]  . multivitamin with minerals tablet 1 tablet  1 tablet Oral Daily Ursula Alert, MD   1 tablet at 01/04/16 0928  . OLANZapine (ZYPREXA) tablet 10 mg  10 mg Oral TID PRN Ursula Alert, MD       Or  . OLANZapine (ZYPREXA) injection 10 mg  10 mg Intramuscular TID PRN Ursula Alert, MD      . oxybutynin (DITROPAN-XL) 24 hr tablet 10 mg  10 mg Oral QHS Benjamine Mola, FNP   10 mg at 01/03/16 2107  . zolpidem (AMBIEN) tablet 10 mg  10 mg Oral QHS Ursula Alert, MD   10 mg at 01/03/16 2107     Lab Results:  Results for orders placed or performed during the hospital encounter of 12/21/15 (from the past 48 hour(s))  Glucose, capillary     Status: None   Collection Time: 01/02/16  5:19 PM  Result Value Ref Range   Glucose-Capillary 89 65 - 99 mg/dL   Comment 1 Notify RN    Comment 2 Document in Chart   Glucose, capillary     Status: None   Collection Time: 01/03/16  6:13 AM  Result Value Ref Range   Glucose-Capillary 82 65 - 99 mg/dL  Valproic acid level     Status: Abnormal   Collection Time: 01/03/16  6:28 AM  Result Value Ref Range   Valproic Acid Lvl 107 (H) 50.0 - 100.0 ug/mL    Comment: Performed at [facility]  Glucose, capillary     Status: None   Collection Time: 01/03/16 11:57 AM  Result Value Ref Range   Glucose-Capillary 72 65 - 99 mg/dL   Comment 1 Notify RN    Comment 2 Document in Chart   Glucose, capillary     Status: Abnormal   Collection Time: 01/03/16  4:56 PM  Result Value Ref Range   Glucose-Capillary 102 (H) 65 - 99 mg/dL  Glucose, capillary     Status: None   Collection Time: 01/03/16  8:59 PM  Result Value Ref Range   Glucose-Capillary 93 65 - 99 mg/dL  Glucose, capillary     Status: None   Collection Time: 01/04/16  6:58 AM  Result Value Ref Range   Glucose-Capillary 92 65 - 99 mg/dL  Glucose, capillary     Status: Abnormal   Collection Time: 01/04/16 11:40 AM  Result Value Ref Range   Glucose-Capillary 104 (H) 65 - 99 mg/dL    Blood Alcohol level:  Lab Results  Component Value Date   ETH <5 12/20/2015   ETH <5 [id number]    Metabolic Disorder Labs: Lab Results  Component Value Date   HGBA1C 5.6 07/25/2015   MPG 114 07/25/2015   MPG 137 02/01/2015   Lab Results  Component Value Date   PROLACTIN 46.5 (H) 07/25/2015   PROLACTIN 21.6 02/01/2015   Lab Results  Component Value Date   CHOL 135 07/25/2015   TRIG 59 07/25/2015   HDL 50 07/25/2015   CHOLHDL 2.7 07/25/2015   VLDL 12 07/25/2015   LDLCALC  73 07/25/2015   LDLCALC 78 02/01/2015    Physical Findings: AIMS: Facial and Oral Movements Muscles  of Facial Expression: None, normal Lips and Perioral Area: None, normal Jaw: None, normal Tongue: None, normal,Extremity Movements Upper (arms, wrists, hands, fingers): None, normal Lower (legs, knees, ankles, toes): None, normal, Trunk Movements Neck, shoulders, hips: None, normal, Overall Severity Severity of abnormal movements (highest score from questions above): None, normal Incapacitation due to abnormal movements: None, normal Patient's awareness of abnormal movements (rate only patient's report): No Awareness, Dental Status Current problems with teeth and/or dentures?: No Does patient usually wear dentures?: No  CIWA:    COWS:     Musculoskeletal: Strength & Muscle Tone: within normal limits Gait & Station: normal Patient leans: N/A  Psychiatric Specialty Exam: Physical Exam  Nursing note and vitals reviewed.   Review of Systems  Psychiatric/Behavioral: The patient is nervous/anxious.   All other systems reviewed and are negative.   Blood pressure 108/75, pulse (!) 102, temperature 98.5 F (36.9 C), temperature source Oral, resp. rate 16, height 5\' 4"  (1.626 m), weight 68 kg (150 lb), SpO2 100 %.Body mass index is 25.75 kg/m.  General Appearance: Guarded  Eye Contact:  Poor  Speech:  Normal Rate  Volume:  Normal  Mood:  Anxious, Depressed and Irritable  Affect:  Constricted  Thought Process:  Goal Directed and Descriptions of Associations: Circumstantial  Orientation:  Other:  place, situation  Thought Content:  Delusions, Paranoid Ideation and Rumination  Suicidal Thoughts:  No  Homicidal Thoughts:  No  Memory:  Immediate;   Fair Recent;   Fair Remote;   Fair  Judgement:  Impaired  Insight:  Shallow  Psychomotor Activity:  Decreased  Concentration:  Concentration: Fair and Attention Span: Fair  Recall:  [employer] of Knowledge:  Fair  Language:  Fair   Akathisia:  No  Handed:  Right  AIMS (if indicated):     Assets:  Desire for Improvement  ADL's:  Intact  Cognition:  WNL  Sleep:  Number of Hours: 6.75     Treatment Plan Summary:Patient continues to be seen as paranoid ,irritable on and off - with encouragement has been more visible on the unit today - however continues to need redirection and support. Depakote to be discontinued today since she is refusing it. Continue treatment.    Schizoaffective disorder, bipolar type ([place]) unstable - improving  Will continue today 01/04/16  plan as below except where it is noted.   Daily contact with patient to assess and evaluate symptoms and progress in treatment and Medication management For Psychosis: Increased Abilify to 30 mg po - change to qpm.  For Mood lability: Will continue Abilify for the same . Will discontinue  Depakote for lack of efficacy and ADRs.  For anxiety/agitation: PRN medications as per agitation protocol.  For insomnia (and depression )  Ambien 10 mg po qhs.   Will continue home medications as indicated.  CSW will continue to work on disposition.   I certify that the services received since the previous certification/recertification were and continue to be medically necessary as the treatment provided can be reasonably expected to improve the patient's condition; the medical record documents that the services furnished were intensive treatment services or their equivalent services, and this patient continues to need, on a daily basis, active treatment furnished directly by or requiring the supervision of inpatient psychiatric personnel.    Wenzel Backlund, MD 01/04/2016, 12:21 PM

## 2016-01-04 NOTE — Progress Notes (Signed)
Patient A/O, no noted distress. Denies SI/AVH. Refused meals and some medications. Patient stayed in room most of shift. At one point she did go out into hallway as she wrote in her journal. Encouraged patient to sat in group, but refused too. Staff will continue to monitor, meet needs, and maintain safety.

## 2016-01-04 NOTE — Progress Notes (Signed)
Recreation Therapy Notes  Date: 01/04/16 Time: 1000 Location: 500 Hall Dayroom  Group Topic: Coping Skills  Goal Area(s) Addresses:  Patient will be able to identify positive coping skills. Patient will be able to identify benefits of coping skills. Patient will be able to identify benefits of using coping skills post d/c.  Intervention: Can with various coping skills in it, dry erase maker, dry erase board  Activity: Coping Skills [place].  LRT had a can with various types of coping skills.  A patient would come to the board, pick a slip of paper from the can and draw whatever is on the paper on the board.  The remaining patients would attempt to guess what the patient is drawing.  The person that guesses correctly would go next.  Education: Radiographer, therapeutic, Dentist.   Education Outcome: Acknowledges understanding/In group clarification offered/Needs additional education.   Clinical Observations/Feedback: Pt did not attend group.   Victorino Sparrow, LRT/CTRS         Victorino Sparrow A 01/04/2016 1:11 PM

## 2016-01-04 NOTE — Tx Team (Signed)
Interdisciplinary Treatment and Diagnostic Plan Update  01/04/2016 Time of Session: 5:17 PM  [name] [name] MRN: [id number]  Principal Diagnosis: Schizoaffective disorder, bipolar type ([place])  Secondary Diagnoses: Principal Problem:   Schizoaffective disorder, bipolar type ([place]) Active Problems:   Non compliance w medication regimen   Diabetes mellitus ([name])   Current Medications:  Current Facility-Administered Medications  Medication Dose Route Frequency Provider Last Rate Last Dose  . acetaminophen (TYLENOL) tablet 650 mg  650 mg Oral Q6H PRN Benjamine Mola, FNP      . alum & mag hydroxide-simeth (MAALOX/MYLANTA) 200-200-20 MG/5ML suspension 30 mL  30 mL Oral Q4H PRN Benjamine Mola, FNP   30 mL at 12/31/15 2032  . ARIPiprazole (ABILIFY) tablet 30 mg  30 mg Oral Q supper Saramma Eappen, MD      . feeding supplement (GLUCERNA SHAKE) (GLUCERNA SHAKE) liquid 237 mL  237 mL Oral TID BM Saramma Eappen, MD   237 mL at 01/04/16 1400  . hydrOXYzine (ATARAX/VISTARIL) tablet 25 mg  25 mg Oral TID PRN Benjamine Mola, FNP   25 mg at 12/22/15 2125  . insulin aspart (novoLOG) injection 0-15 Units  0-15 Units Subcutaneous TID WC Ursula Alert, MD   0 Units at 12/26/15 1225  . insulin aspart (novoLOG) injection 0-5 Units  0-5 Units Subcutaneous QHS Ursula Alert, MD   Stopped at 12/23/15 2203  . magnesium hydroxide (MILK OF MAGNESIA) suspension 30 mL  30 mL Oral Daily PRN Benjamine Mola, FNP      . metFORMIN (GLUCOPHAGE) tablet 500 mg  500 mg Oral Q breakfast Benjamine Mola, FNP   500 mg at 01/04/16 4235  . multivitamin with minerals tablet 1 tablet  1 tablet Oral Daily Ursula Alert, MD   1 tablet at 01/04/16 0928  . OLANZapine (ZYPREXA) tablet 10 mg  10 mg Oral TID PRN Ursula Alert, MD       Or  . OLANZapine (ZYPREXA) injection 10 mg  10 mg Intramuscular TID PRN Ursula Alert, MD      . oxybutynin (DITROPAN-XL) 24 hr tablet 10 mg  10 mg Oral QHS Benjamine Mola, FNP   10 mg at 01/03/16  2107  . zolpidem (AMBIEN) tablet 10 mg  10 mg Oral QHS Ursula Alert, MD   10 mg at 01/03/16 2107    PTA Medications: Prescriptions Prior to Admission  Medication Sig Dispense Refill Last Dose  . divalproex (DEPAKOTE ER) 500 MG 24 hr tablet Take 2 tablets (1,000 mg total) by mouth at bedtime. (Patient not taking: Reported on 12/21/2015) 60 tablet 0 Not Taking at Unknown time  . hydrOXYzine (ATARAX/VISTARIL) 25 MG tablet Take 1 tablet (25 mg total) by mouth 3 (three) times daily as needed for anxiety. (Patient not taking: Reported on 12/21/2015) 30 tablet 0 Not Taking at Unknown time  . metFORMIN (GLUCOPHAGE) 500 MG tablet Take 1 tablet (500 mg total) by mouth daily with breakfast. (Patient not taking: Reported on 12/21/2015) 30 tablet 0 Not Taking at Unknown time  . oxybutynin (DITROPAN-XL) 10 MG 24 hr tablet Take 1 tablet (10 mg total) by mouth at bedtime. (Patient not taking: Reported on 12/21/2015) 30 tablet 0 Not Taking at Unknown time  . paliperidone (INVEGA SUSTENNA) 156 MG/ML SUSP injection Inject 1 mL (156 mg total) into the muscle once. 0.9 mL 0   . paliperidone (INVEGA SUSTENNA) 156 MG/ML SUSP injection Inject 0.75 mLs (117 mg total) into the muscle every 28 (twenty-eight) days. Next dose is due  09/07/2015 (Patient not taking: Reported on 12/21/2015) 0.9 mL 0 Not Taking at Unknown time  . paliperidone (INVEGA) 6 MG 24 hr tablet Take 1 tablet (6 mg total) by mouth at bedtime. 3 tablet 0   . pantoprazole (PROTONIX) 40 MG tablet Take 1 tablet (40 mg total) by mouth daily. (Patient not taking: Reported on 12/21/2015) 30 tablet 0 Not Taking at Unknown time    Treatment Modalities: Medication Management, Group therapy, Case management,  1 to 1 session with clinician, Psychoeducation, Recreational therapy.   Physician Treatment Plan for Primary Diagnosis: Schizoaffective disorder, bipolar type ([place]) Long Term Goal(s): Improvement in symptoms so as ready for discharge  Short Term Goals:  Ability to verbalize feelings will improve   Medication Management: Evaluate patient's response, side effects, and tolerance of medication regimen.  Therapeutic Interventions: 1 to 1 sessions, Unit Group sessions and Medication administration.  Evaluation of Outcomes: Progressing  Physician Treatment Plan for Secondary Diagnosis: Principal Problem:   Schizoaffective disorder, bipolar type ([place]) Active Problems:   Non compliance w medication regimen   Diabetes mellitus ([place])   Long Term Goal(s): Improvement in symptoms so as ready for discharge  Short Term Goals:  Compliance with prescribed medications will improve  Medication Management: Evaluate patient's response, side effects, and tolerance of medication regimen.  Therapeutic Interventions: 1 to 1 sessions, Unit Group sessions and Medication administration.  Evaluation of Outcomes: Progressing   12/21:For Psychosis: Will continue Abilify 15 mg po - change dosing time to AM. Pt refused past two doses - but took it this AM .   For Mood lability: Will change Depakote to 500 mg po bid. Pt refused it previous doses - but was able to take it this AM. Depakote level on 12/25/15- 56 ( therapeutic)   RN Treatment Plan for Primary Diagnosis: Schizoaffective disorder, bipolar type ([place]) Long Term Goal(s): Knowledge of disease and therapeutic regimen to maintain health will improve  Short Term Goals: Ability to participate in decision making will improve and Compliance with prescribed medications will improve  Medication Management: RN will administer medications as ordered by provider, will assess and evaluate patient's response and provide education to patient for prescribed medication. RN will report any adverse and/or side effects to prescribing provider.  Therapeutic Interventions: 1 on 1 counseling sessions, Psychoeducation, Medication administration, Evaluate responses to treatment, Monitor vital signs and CBGs as  ordered, Perform/monitor CIWA, COWS, AIMS and Fall Risk screenings as ordered, Perform wound care treatments as ordered.  Evaluation of Outcomes: Progressing   Recreational Therapy Treatment Plan for Primary Diagnosis: Schizoaffective disorder, bipolar type ([place]) Long Term Goal(s):  LTG- Patient will participate in recreation therapy tx in at least 2 group sessions without prompting from LRT.  Short Term Goals:  Patient will demonstrate increased ability to follow instructions, as demonstrated by ability to follow LRT instructions on first prompt during recreation therapy group sessions.  Treatment Modalities: Group and Pet Therapy  Therapeutic Interventions: Psychoeducation  Evaluation of Outcomes: Progressing   LCSW Treatment Plan for Primary Diagnosis: Schizoaffective disorder, bipolar type ([place]) Long Term Goal(s): Safe transition to appropriate next level of care at discharge, Engage patient in therapeutic group addressing interpersonal concerns.  Short Term Goals: Engage patient in aftercare planning with referrals and resources  Therapeutic Interventions: Assess for all discharge needs, 1 to 1 time with Social worker, Explore available resources and support systems, Assess for adequacy in community support network, Educate family and significant other(s) on suicide prevention, Complete Psychosocial Assessment, Interpersonal group therapy.  Evaluation of Outcomes: Not Met  Unable to engage patient in any meaningful conversation 12/21:  Remains the same   Progress in Treatment: Attending groups: No Participating in groups: No Taking medication as prescribed: Yes Toleration medication: Yes, no side effects reported at this time Family/Significant other contact made: No Patient understands diagnosis: No  Limited insight Discussing patient identified problems/goals with staff: Yes Medical problems stabilized or resolved: Yes Denies suicidal/homicidal ideation:  Yes Issues/concerns per patient self-inventory: None Other: N/A  New problem(s) identified: None identified at this time.   New Short Term/Long Term Goal(s): None identified at this time.   Discharge Plan or Barriers: Unknown  Reason for Continuation of Hospitalization: Paranoia Disorganization Medication stabilization    Estimated Length of Stay: 3-5 days  Attendees: Patient: 01/04/2016  5:17 PM  Physician: Ursula Alert, MD  01/04/2016  5:17 PM  Nursing: Phillis Haggis, RN 01/04/2016  5:17 PM  RN Care Manager: Lars Pinks, RN 01/04/2016  5:17 PM  Social Worker: [place], [place] 01/04/2016  5:17 PM  Recreational Therapist: Laretta Bolster  01/04/2016  5:17 PM  Other: Norberto Sorenson 01/04/2016  5:17 PM  Other:  01/04/2016  5:17 PM    Scribe for Treatment Team:  Wray Kearns MSW, LCSWA 01/04/2016

## 2016-01-04 NOTE — Progress Notes (Signed)
Did not attend group 

## 2016-01-04 NOTE — [facility] Group Notes (Signed)
[place] LCSW Group Therapy  01/04/2016 1:46 PM  Type of Therapy:  Group Therapy  Participation Level:  Did Not Attend  Modes of Intervention:  Activity, Discussion, Education, Socialization and Support  Summary of Progress/Problems: [place] facilitated group. Patients discussed care and what care means to them.   [place] MSW, [place]  01/04/2016, 1:46 PM

## 2016-01-04 NOTE — Progress Notes (Signed)
D: Pt was in bed in her room upon initial approach.  Pt presents with depressed affect and mood.  She forwards little information to Probation officer.  Pt denies SI/HI, denies hallucinations, denies pain.  Pt isolated to her room for the majority of the night.  Pt did not go to evening group.    A: Introduced self to pt.  Actively listened to pt and offered support and encouragement. Medications offered per order.   R: Pt is safe on the unit.  Pt refused scheduled Depakote, stating "they keep drawing blood on me every day and it's the same, I'm not taking Depakote."  Medication education provided.  Pt verbally contracts for safety.  Will continue to monitor and assess.

## 2016-01-05 LAB — GLUCOSE, CAPILLARY
GLUCOSE-CAPILLARY: 96 mg/dL (ref 65–99)
GLUCOSE-CAPILLARY: 134 mg/dL — AB (ref 65–99)
GLUCOSE-CAPILLARY: 126 mg/dL — AB (ref 65–99)

## 2016-01-05 MED ORDER — PRAZOSIN HCL 1 MG PO CAPS
1.0000 mg | ORAL_CAPSULE | Freq: Every day | ORAL | Status: DC
  Administered 2016-01-05 – 2016-01-06 (×2): 1 mg via ORAL
  Filled 2016-01-05 (×5): qty 1

## 2016-01-05 NOTE — Progress Notes (Signed)
Writer has observed patient lying in bed resting and has been isolative to her room. She refused to have her blood sugar taken tonight. She was compliant with her scheduled medications but continues to consult God and ask what he thinks or has to say whether she should or should not take her medication while writer is in the room with her. She was pleasant and cooperative. Snacks offered but she declined. Writer praised her for taking her medications. Safety maintained on unit with 15 min checks.

## 2016-01-05 NOTE — Progress Notes (Signed)
[facility] MD Progress Note  01/05/2016 2:12 PM [name] [name]  MRN:  [id number] Subjective: Patient states " I want to simplify my life .'     Objective: Patient seen and chart reviewed.Discussed patient with treatment team.  Pt today seen as awake - more visible in milieu. Pt continues to have poor eye contact , but is more talkative and response to questions are more appropriate. Pt continues to have some nightmares.offered prazosin - agrees. Pt continues to need a lot of support.    Principal Problem: Schizoaffective disorder, bipolar type ([place]) Diagnosis:   Patient Active Problem List   Diagnosis Date Noted  . Insomnia [G47.00]   . Anxiety state [F41.1]   . Overactive bladder [N32.81]   . Diabetes mellitus ([place]) [E11.9] 02/08/2015  . Schizoaffective disorder, bipolar type ([place]) [F25.0] 01/28/2015  . Non compliance w medication regimen [Z91.14]    Total Time spent with patient: 20 minutes  Past Psychiatric History: Please see H&P.  Past Medical History:  Past Medical History:  Diagnosis Date  . Bipolar affective disorder, currently manic, mild ([place])   . Diabetes mellitus without complication ([place])   . Schizophrenia ([place])    History reviewed. No pertinent surgical history. Family History:  Family History  Problem Relation Age of Onset  . Drug abuse Maternal Uncle    Family Psychiatric  History: Please see H&P.  Social History:  History  Alcohol Use No     History  Drug Use No    Social History   Social History  . Marital status: Legally Separated    Spouse name: N/A  . Number of children: N/A  . Years of education: N/A   Social History Main Topics  . Smoking status: Never Smoker  . Smokeless tobacco: Never Used  . Alcohol use No  . Drug use: No  . Sexual activity: Not Currently   Other Topics Concern  . None   Social History Narrative  . None   Additional Social History:    Pain Medications: See MAR  Prescriptions: See MAR Over the  Counter: See MAR History of alcohol / drug use?: No history of alcohol / drug abuse                    Sleep: Fair  Appetite:  Fair  Current Medications: Current Facility-Administered Medications  Medication Dose Route Frequency Provider Last Rate Last Dose  . acetaminophen (TYLENOL) tablet 650 mg  650 mg Oral Q6H PRN Benjamine Mola, FNP      . alum & mag hydroxide-simeth (MAALOX/MYLANTA) 200-200-20 MG/5ML suspension 30 mL  30 mL Oral Q4H PRN Benjamine Mola, FNP   30 mL at 12/31/15 2032  . ARIPiprazole (ABILIFY) tablet 30 mg  30 mg Oral Q supper Ursula Alert, MD   30 mg at 01/04/16 1700  . feeding supplement (GLUCERNA SHAKE) (GLUCERNA SHAKE) liquid 237 mL  237 mL Oral TID BM Maude Gloor, MD   237 mL at 01/04/16 1400  . hydrOXYzine (ATARAX/VISTARIL) tablet 25 mg  25 mg Oral TID PRN Benjamine Mola, FNP   25 mg at 12/22/15 2125  . insulin aspart (novoLOG) injection 0-15 Units  0-15 Units Subcutaneous TID WC Ursula Alert, MD   0 Units at 12/26/15 1225  . insulin aspart (novoLOG) injection 0-5 Units  0-5 Units Subcutaneous QHS Ursula Alert, MD   Stopped at 12/23/15 2203  . magnesium hydroxide (MILK OF MAGNESIA) suspension 30 mL  30 mL Oral Daily PRN Elyse Jarvis  [place], FNP      . metFORMIN (GLUCOPHAGE) tablet 500 mg  500 mg Oral Q breakfast Benjamine Mola, FNP   500 mg at 01/05/16 0858  . multivitamin with minerals tablet 1 tablet  1 tablet Oral Daily Ursula Alert, MD   1 tablet at 01/05/16 0858  . OLANZapine (ZYPREXA) tablet 10 mg  10 mg Oral TID PRN Ursula Alert, MD       Or  . OLANZapine (ZYPREXA) injection 10 mg  10 mg Intramuscular TID PRN Ursula Alert, MD      . oxybutynin (DITROPAN-XL) 24 hr tablet 10 mg  10 mg Oral QHS Benjamine Mola, FNP   10 mg at 01/04/16 2200  . prazosin (MINIPRESS) capsule 1 mg  1 mg Oral QHS Emmanuella Mirante, MD      . zolpidem (AMBIEN) tablet 10 mg  10 mg Oral QHS Ursula Alert, MD   10 mg at 01/04/16 2200    Lab Results:  Results for orders  placed or performed during the hospital encounter of 12/21/15 (from the past 48 hour(s))  Glucose, capillary     Status: Abnormal   Collection Time: 01/03/16  4:56 PM  Result Value Ref Range   Glucose-Capillary 102 (H) 65 - 99 mg/dL  Glucose, capillary     Status: None   Collection Time: 01/03/16  8:59 PM  Result Value Ref Range   Glucose-Capillary 93 65 - 99 mg/dL  Glucose, capillary     Status: None   Collection Time: 01/04/16  6:58 AM  Result Value Ref Range   Glucose-Capillary 92 65 - 99 mg/dL  Glucose, capillary     Status: Abnormal   Collection Time: 01/04/16 11:40 AM  Result Value Ref Range   Glucose-Capillary 104 (H) 65 - 99 mg/dL  Glucose, capillary     Status: Abnormal   Collection Time: 01/04/16  4:49 PM  Result Value Ref Range   Glucose-Capillary 124 (H) 65 - 99 mg/dL  Glucose, capillary     Status: None   Collection Time: 01/05/16  6:27 AM  Result Value Ref Range   Glucose-Capillary 96 65 - 99 mg/dL  Glucose, capillary     Status: Abnormal   Collection Time: 01/05/16 10:24 AM  Result Value Ref Range   Glucose-Capillary 134 (H) 65 - 99 mg/dL   Comment 1 Notify RN    Comment 2 Document in Chart   Glucose, capillary     Status: Abnormal   Collection Time: 01/05/16 11:50 AM  Result Value Ref Range   Glucose-Capillary 126 (H) 65 - 99 mg/dL   Comment 1 Notify RN    Comment 2 Document in Chart     Blood Alcohol level:  Lab Results  Component Value Date   ETH <5 12/20/2015   ETH <5 123XX123    Metabolic Disorder Labs: Lab Results  Component Value Date   HGBA1C 5.6 07/25/2015   MPG 114 07/25/2015   MPG 137 02/01/2015   Lab Results  Component Value Date   PROLACTIN 46.5 (H) 07/25/2015   PROLACTIN 21.6 02/01/2015   Lab Results  Component Value Date   CHOL 135 07/25/2015   TRIG 59 07/25/2015   HDL 50 07/25/2015   CHOLHDL 2.7 07/25/2015   VLDL 12 07/25/2015   LDLCALC 73 07/25/2015   LDLCALC 78 02/01/2015    Physical Findings: AIMS: Facial and  Oral Movements Muscles of Facial Expression: None, normal Lips and Perioral Area: None, normal Jaw: None, normal Tongue: None, normal,Extremity Movements  Upper (arms, wrists, hands, fingers): None, normal Lower (legs, knees, ankles, toes): None, normal, Trunk Movements Neck, shoulders, hips: None, normal, Overall Severity Severity of abnormal movements (highest score from questions above): None, normal Incapacitation due to abnormal movements: None, normal Patient's awareness of abnormal movements (rate only patient's report): No Awareness, Dental Status Current problems with teeth and/or dentures?: No Does patient usually wear dentures?: No  CIWA:    COWS:     Musculoskeletal: Strength & Muscle Tone: within normal limits Gait & Station: normal Patient leans: N/A  Psychiatric Specialty Exam: Physical Exam  Nursing note and vitals reviewed.   Review of Systems  Psychiatric/Behavioral: The patient is nervous/anxious.   All other systems reviewed and are negative.   Blood pressure 119/62, pulse (!) 110, temperature 97.8 F (36.6 C), temperature source Oral, resp. rate 16, height 5\' 4"  (1.626 m), weight 68 kg (150 lb), SpO2 100 %.Body mass index is 25.75 kg/m.  General Appearance: Guarded  Eye Contact:  Poor improving  Speech:  Normal Rate  Volume:  Normal   improving  Affect:  Constricted  Thought Process:  Goal Directed and Descriptions of Associations: Circumstantial  Orientation:  Other:  place, situation  Thought Content:  Delusions, Paranoid Ideation and Rumination improving  Suicidal Thoughts:  No  Homicidal Thoughts:  No  Memory:  Immediate;   Fair Recent;   Fair Remote;   Fair  Judgement:  Impaired  Insight:  Shallow  Psychomotor Activity:  Decreased  Concentration:  Concentration: Fair and Attention Span: Fair  Recall:  [employer] of Knowledge:  Fair  Language:  Fair  Akathisia:  No  Handed:  Right  AIMS (if indicated):     Assets:  Desire for Improvement   ADL's:  Intact  Cognition:  WNL  Sleep:  Number of Hours: 2.25     Treatment Plan Summary:Patient today seen as less paranoid, more talkative . Continue treatment.  Schizoaffective disorder, bipolar type ([place]) unstable - improving  Will continue today 01/05/16  plan as below except where it is noted.   Daily contact with patient to assess and evaluate symptoms and progress in treatment and Medication management For Psychosis: Increased Abilify to 30 mg po - change to qpm.  For Mood lability: Will continue Abilify for the same .Discontinued Depakote for lack of efficacy and ADRs.  For anxiety/agitation: PRN medications as per agitation protocol.  For insomnia (and depression )  Ambien 10 mg po qhs.   Will continue home medications as indicated.  CSW will continue to work on disposition.   I certify that the services received since the previous certification/recertification were and continue to be medically necessary as the treatment provided can be reasonably expected to improve the patient's condition; the medical record documents that the services furnished were intensive treatment services or their equivalent services, and this patient continues to need, on a daily basis, active treatment furnished directly by or requiring the supervision of inpatient psychiatric personnel.    Karry Barrilleaux, MD 01/05/2016, 2:12 PM

## 2016-01-05 NOTE — Progress Notes (Signed)
Writer observed patient lying in bed resting but not asleep. She has been isolative in her room all night other than coming to medication room. She refused to have her cbg taken but did take her scheduled ambien and oxybutin. Support given and safety maintained on uit with 15 min checks.

## 2016-01-05 NOTE — Progress Notes (Signed)
D: Patient's self inventory sheet: patient has good sleep, no sleep medication.good  Appetite, low energy level, good concentration. Rated depression 0/10, hopeless 0/10, anxiety 0/10. SI/HI/AVH: Denies, however is responding to [facility] which she reports is the voice of god. Physical complaints are back and rib pain. Goal is "talk to doc". Patient upset about medication dosage, stated that she was being overdosed. Increased verbosity with labile affect. A: Medications administered, assessed medication knowledge and education given on medication regimen.  Emotional support and encouragement given patient. R: Denies SI and HI , contracts for safety. Safety maintained with 15 minute checks.

## 2016-01-05 NOTE — BHH Group Notes (Signed)
[place] Group Notes:  (Nursing/MHT/Case Management/Adjunct)  Date:  01/05/2016  Time:  10:17 AM  Type of Therapy:  Nurse Education  Participation Level:  Minimal  Participation Quality:  Minimal  Affect:  Blunted  Cognitive:  Appropriate  Insight:  Limited  Engagement in Group:  Limited  Modes of Intervention:  Discussion  Summary of Progress/Problems: Patient said that she came to group because she told the doctor that she would, and identified "group" as a coping skill that works best for her. [name] [name] Morgan Keinath 01/05/2016, 10:17 AM

## 2016-01-05 NOTE — BHH Group Notes (Signed)
[place] Group Notes: (Clinical Social Work)   01/05/2016      Type of Therapy:  Group Therapy   Participation Level:  Did Not Attend despite MHT prompting   Selmer Dominion, LCSW 01/05/2016, 12:31 PM

## 2016-01-06 MED ORDER — ARIPIPRAZOLE 10 MG PO TABS
20.0000 mg | ORAL_TABLET | Freq: Every day | ORAL | Status: DC
  Administered 2016-01-06: 20 mg via ORAL
  Filled 2016-01-06 (×4): qty 2

## 2016-01-06 MED ORDER — ARIPIPRAZOLE ER 400 MG IM SRER
400.0000 mg | INTRAMUSCULAR | Status: DC
  Administered 2016-01-07: 400 mg via INTRAMUSCULAR

## 2016-01-06 MED ORDER — DOXEPIN HCL 10 MG PO CAPS
20.0000 mg | ORAL_CAPSULE | Freq: Every day | ORAL | Status: DC
  Administered 2016-01-06: 20 mg via ORAL
  Filled 2016-01-06 (×3): qty 2

## 2016-01-06 NOTE — BHH Group Notes (Signed)
[place] Group Notes: (Clinical Social Work)   01/06/2016      Type of Therapy:  Group Therapy   Participation Level:  Did Not Attend despite MHT prompting   Selmer Dominion, LCSW 01/06/2016, 12:26 PM

## 2016-01-06 NOTE — Progress Notes (Signed)
[facility] MD Progress Note  01/06/2016 11:58 AM [name] [name]  MRN:  [id number] Subjective: Patient states " I am having these dreams, I had a bad experience last night. I cannot tell you what it was since I cannot remember , but I just know that it was a bad experience.I do not know what you are giving me that is making me feel that way. I want to know what I am taking."    Objective: Patient seen and chart reviewed.Discussed patient with treatment team.  Pt seen as awake. Pt continues to have poor eye contact - but is improving - since she is able to communicate more , is more visible in milieu . Pt has been complaining of having vivid dream like experience - hence discussed ambien be discontinued . Pt continues to talk about feeling tired - her depakote was discontinued due to the same . Her tiredness could also be due to her having diabetes . Pt to be provided with educational materials for diabetic management. Continue treatment.      Principal Problem: Schizoaffective disorder, bipolar type ([place]) Diagnosis:   Patient Active Problem List   Diagnosis Date Noted  . Insomnia [G47.00]   . Anxiety state [F41.1]   . Overactive bladder [N32.81]   . Diabetes mellitus ([place]) [E11.9] 02/08/2015  . Schizoaffective disorder, bipolar type ([place]) [F25.0] 01/28/2015  . Non compliance w medication regimen [Z91.14]    Total Time spent with patient: 25 minutes  Past Psychiatric History: Please see H&P.  Past Medical History:  Past Medical History:  Diagnosis Date  . Bipolar affective disorder, currently manic, mild ([place])   . Diabetes mellitus without complication ([place])   . Schizophrenia ([place])    History reviewed. No pertinent surgical history. Family History:  Family History  Problem Relation Age of Onset  . Drug abuse Maternal Uncle    Family Psychiatric  History: Please see H&P.  Social History:  History  Alcohol Use No     History  Drug Use No    Social History    Social History  . Marital status: Legally Separated    Spouse name: N/A  . Number of children: N/A  . Years of education: N/A   Social History Main Topics  . Smoking status: Never Smoker  . Smokeless tobacco: Never Used  . Alcohol use No  . Drug use: No  . Sexual activity: Not Currently   Other Topics Concern  . None   Social History Narrative  . None   Additional Social History:    Pain Medications: See MAR  Prescriptions: See MAR Over the Counter: See MAR History of alcohol / drug use?: No history of alcohol / drug abuse                    Sleep: Fair  Appetite:  Fair  Current Medications: Current Facility-Administered Medications  Medication Dose Route Frequency Provider Last Rate Last Dose  . acetaminophen (TYLENOL) tablet 650 mg  650 mg Oral Q6H PRN Benjamine Mola, FNP      . alum & mag hydroxide-simeth (MAALOX/MYLANTA) 200-200-20 MG/5ML suspension 30 mL  30 mL Oral Q4H PRN Benjamine Mola, FNP   30 mL at 12/31/15 2032  . ARIPiprazole (ABILIFY) tablet 30 mg  30 mg Oral Q supper Ursula Alert, MD   30 mg at 01/05/16 1808  . ARIPiprazole ER SRER 400 mg  400 mg Intramuscular Q28 days Ursula Alert, MD      . doxepin (  SINEQUAN) capsule 20 mg  20 mg Oral QHS Devota Viruet, MD      . feeding supplement (GLUCERNA SHAKE) (GLUCERNA SHAKE) liquid 237 mL  237 mL Oral TID BM Valissa Lyvers, MD   237 mL at 01/05/16 1411  . hydrOXYzine (ATARAX/VISTARIL) tablet 25 mg  25 mg Oral TID PRN Benjamine Mola, FNP   25 mg at 12/22/15 2125  . insulin aspart (novoLOG) injection 0-15 Units  0-15 Units Subcutaneous TID WC Ursula Alert, MD   0 Units at 12/26/15 1225  . insulin aspart (novoLOG) injection 0-5 Units  0-5 Units Subcutaneous QHS Ursula Alert, MD   Stopped at 12/23/15 2203  . magnesium hydroxide (MILK OF MAGNESIA) suspension 30 mL  30 mL Oral Daily PRN Benjamine Mola, FNP      . metFORMIN (GLUCOPHAGE) tablet 500 mg  500 mg Oral Q breakfast Benjamine Mola, FNP   500  mg at 01/05/16 0858  . multivitamin with minerals tablet 1 tablet  1 tablet Oral Daily Ursula Alert, MD   1 tablet at 01/05/16 0858  . OLANZapine (ZYPREXA) tablet 10 mg  10 mg Oral TID PRN Ursula Alert, MD       Or  . OLANZapine (ZYPREXA) injection 10 mg  10 mg Intramuscular TID PRN Ursula Alert, MD      . oxybutynin (DITROPAN-XL) 24 hr tablet 10 mg  10 mg Oral QHS Benjamine Mola, FNP   10 mg at 01/05/16 2105  . prazosin (MINIPRESS) capsule 1 mg  1 mg Oral QHS Ursula Alert, MD   1 mg at 01/05/16 2106    Lab Results:  Results for orders placed or performed during the hospital encounter of 12/21/15 (from the past 48 hour(s))  Glucose, capillary     Status: Abnormal   Collection Time: 01/04/16  4:49 PM  Result Value Ref Range   Glucose-Capillary 124 (H) 65 - 99 mg/dL  Glucose, capillary     Status: None   Collection Time: 01/05/16  6:27 AM  Result Value Ref Range   Glucose-Capillary 96 65 - 99 mg/dL  Glucose, capillary     Status: Abnormal   Collection Time: 01/05/16 10:24 AM  Result Value Ref Range   Glucose-Capillary 134 (H) 65 - 99 mg/dL   Comment 1 Notify RN    Comment 2 Document in Chart   Glucose, capillary     Status: Abnormal   Collection Time: 01/05/16 11:50 AM  Result Value Ref Range   Glucose-Capillary 126 (H) 65 - 99 mg/dL   Comment 1 Notify RN    Comment 2 Document in Chart     Blood Alcohol level:  Lab Results  Component Value Date   ETH <5 12/20/2015   ETH <5 123XX123    Metabolic Disorder Labs: Lab Results  Component Value Date   HGBA1C 5.6 07/25/2015   MPG 114 07/25/2015   MPG 137 02/01/2015   Lab Results  Component Value Date   PROLACTIN 46.5 (H) 07/25/2015   PROLACTIN 21.6 02/01/2015   Lab Results  Component Value Date   CHOL 135 07/25/2015   TRIG 59 07/25/2015   HDL 50 07/25/2015   CHOLHDL 2.7 07/25/2015   VLDL 12 07/25/2015   LDLCALC 73 07/25/2015   LDLCALC 78 02/01/2015    Physical Findings: AIMS: Facial and Oral  Movements Muscles of Facial Expression: None, normal Lips and Perioral Area: None, normal Jaw: None, normal Tongue: None, normal,Extremity Movements Upper (arms, wrists, hands, fingers): None, normal Lower (legs,  knees, ankles, toes): None, normal, Trunk Movements Neck, shoulders, hips: None, normal, Overall Severity Severity of abnormal movements (highest score from questions above): None, normal Incapacitation due to abnormal movements: None, normal Patient's awareness of abnormal movements (rate only patient's report): No Awareness, Dental Status Current problems with teeth and/or dentures?: No Does patient usually wear dentures?: No  CIWA:    COWS:     Musculoskeletal: Strength & Muscle Tone: within normal limits Gait & Station: normal Patient leans: N/A  Psychiatric Specialty Exam: Physical Exam  Nursing note and vitals reviewed.   Review of Systems  Psychiatric/Behavioral: The patient is nervous/anxious.   All other systems reviewed and are negative.   Blood pressure 119/62, pulse (!) 110, temperature 97.8 F (36.6 C), temperature source Oral, resp. rate 16, height 5\' 4"  (1.626 m), weight 68 kg (150 lb), SpO2 100 %.Body mass index is 25.75 kg/m.  General Appearance: Guarded  Eye Contact:  Poor  Speech:  Normal Rate  Volume:  Normal  Mood:  Anxious, Depressed and Irritable on and off - mostly about her being tired   Affect:  Congruent  Thought Process:  Goal Directed and Descriptions of Associations: Circumstantial  Orientation:  Other:  place, situation  Thought Content:  Delusions, Paranoid Ideation and Rumination  Suicidal Thoughts:  No  Homicidal Thoughts:  No  Memory:  Immediate;   Fair Recent;   Fair Remote;   Fair  Judgement:  Impaired  Insight:  Shallow  Psychomotor Activity:  Decreased  Concentration:  Concentration: Fair and Attention Span: Fair  Recall:  [employer] of Knowledge:  Fair  Language:  Fair  Akathisia:  No  Handed:  Right  AIMS (if  indicated):     Assets:  Desire for Improvement  ADL's:  Intact  Cognition:  WNL  Sleep:  Number of Hours: 3.25     Treatment Plan Summary:Patient today continues to be somatic - states she is tired and is not happy with her medications. Discussed with her that her medications will be readjusted to help her feel better.  Will provide abilify maintena IM. Continue treatment.    Schizoaffective disorder, bipolar type ([place]) unstable - improving  Will continue today 01/06/16  plan as below except where it is noted.   Daily contact with patient to assess and evaluate symptoms and progress in treatment and Medication management For Psychosis: Reduce Abilify to 20 mg po - change to qpm.- due to feeling tired. Abilify Maintena IM 400 mg - first dose 01/06/16. Repeat q28 days.  For Mood lability: Will continue Abilify for the same . Will discontinue  Depakote for lack of efficacy and ADRs.  For anxiety/agitation: PRN medications as per agitation protocol.  For insomnia (and depression ) Will discontinue Ambien due to vivid dreams. Will add Doxepin 20 mg po qhs.   Will continue home medications as indicated.  CSW will continue to work on disposition.   I certify that the services received since the previous certification/recertification were and continue to be medically necessary as the treatment provided can be reasonably expected to improve the patient's condition; the medical record documents that the services furnished were intensive treatment services or their equivalent services, and this patient continues to need, on a daily basis, active treatment furnished directly by or requiring the supervision of inpatient psychiatric personnel.    Lynlee Stratton, MD 01/06/2016, 11:58 AM
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
## 2016-01-06 NOTE — Progress Notes (Signed)
Patient did not attend evening group. 

## 2016-01-07 LAB — GLUCOSE, CAPILLARY
GLUCOSE-CAPILLARY: 138 mg/dL — AB (ref 65–99)
GLUCOSE-CAPILLARY: 91 mg/dL (ref 65–99)
GLUCOSE-CAPILLARY: 109 mg/dL — AB (ref 65–99)

## 2016-01-07 MED ORDER — DOXEPIN HCL 25 MG PO CAPS
25.0000 mg | ORAL_CAPSULE | Freq: Every day | ORAL | Status: DC
  Administered 2016-01-07: 25 mg via ORAL
  Filled 2016-01-07 (×3): qty 1

## 2016-01-07 MED ORDER — HYDROXYZINE HCL 50 MG PO TABS: 50.0000 mg | ORAL_TABLET | Freq: Every evening | ORAL | Status: DC | PRN

## 2016-01-07 MED ORDER — HYDROXYZINE HCL 50 MG PO TABS
50.0000 mg | ORAL_TABLET | Freq: Every day | ORAL | Status: DC
  Administered 2016-01-07 – 2016-01-08 (×2): 50 mg via ORAL
  Filled 2016-01-07 (×4): qty 1

## 2016-01-07 MED ORDER — ARIPIPRAZOLE 10 MG PO TABS
25.0000 mg | ORAL_TABLET | Freq: Every day | ORAL | Status: DC
  Administered 2016-01-07 – 2016-01-08 (×2): 25 mg via ORAL
  Filled 2016-01-07: qty 3
  Filled 2016-01-07: qty 21
  Filled 2016-01-07 (×3): qty 3

## 2016-01-07 MED ORDER — NAPROXEN 375 MG PO TABS
375.0000 mg | ORAL_TABLET | ORAL | Status: AC
  Administered 2016-01-07 – 2016-01-08 (×2): 375 mg via ORAL
  Filled 2016-01-07 (×2): qty 1

## 2016-01-07 NOTE — Progress Notes (Signed)
Georgine allowed CBG to be taken at this time. Results = 109

## 2016-01-07 NOTE — Progress Notes (Signed)
Pt goal for tomorrow is to work on going home. Pt refused her CBG and did not have snack this evening.

## 2016-01-07 NOTE — Progress Notes (Signed)
D: Pt rates her depression 3/10 today during morning assessment. Has come out of her room for various reasons and was less isolative today. Took all meds without incident. Pt still avoids eye contact but had been smiling more and engaging more than usual. Pt denies SI/HI/AVH.   A: Praised pt on using coping skills. Emotional comfort and support given. Safety checks maintained.   R: Pt contracted for safety and no additional concerns were verbalized by pt.

## 2016-01-07 NOTE — Progress Notes (Signed)
[name] [name] had been in room and in bed much of the evening, she spoke about how she is wanting to be able to go home tomorrow. She has appeared somewhat guarded and suspicious and did question medications but did eventually receive them without incident. Suvi did endorse having difficulties with sleep and also spoke about how she was having a slight headache. Jorge initially refused evening CBG but did eventually allow staff to perform this task. A. Support and encouragement provided. R. Safety maintained, will continue to monitor.

## 2016-01-07 NOTE — BHH Group Notes (Signed)
[facility] LCSW Aftercare Discharge Planning Group Note   01/07/2016 2:31 PM  Participation Quality:    Invited.  Chose to not attend.  Plan for Discharge/Comments:    Transportation Means:   Supports: We talked about her d/cing to [facility].  She asked me to call Ms Gertie Exon to confirm that she can come there.  Ms Gertie Exon states she will call a woman today to see if she is still planning on coming to stay there.  If not, pt can return.  [name]

## 2016-01-07 NOTE — Progress Notes (Signed)
[facility] MD Progress Note  01/07/2016 1:17 PM [name] [name]  MRN:  [id number] Subjective: Patient states " I did not sleep last night. I did not take the injection yesterday since I did not know the dose and I felt that was a big dose you were giving me.'   Objective: Patient seen and chart reviewed.Discussed patient with treatment team.  Pt seen as awake, vaguely anxious. Pt ruminates about her medications , also her aches and pains and her heart. Pt also reports sleep issues - discussed increasing her medications . However when medications are increased she complaints of feeling tired and needs the dose to be reduced. This has been going on since admission. Pt also reports having some " hallucinations " last night.' Her abilify dose was reduced due to her C/O tiredness - will increase it tonight. Provided education about her LAI - and she agrees to take it. Continue treatment.      Principal Problem: Schizoaffective disorder, bipolar type ([place]) Diagnosis:   Patient Active Problem List   Diagnosis Date Noted  . Insomnia [G47.00]   . Anxiety state [F41.1]   . Overactive bladder [N32.81]   . Diabetes mellitus ([place]) [E11.9] 02/08/2015  . Schizoaffective disorder, bipolar type ([place]) [F25.0] 01/28/2015  . Non compliance w medication regimen [Z91.14]    Total Time spent with patient: 25 minutes  Past Psychiatric History: Please see H&P.  Past Medical History:  Past Medical History:  Diagnosis Date  . Bipolar affective disorder, currently manic, mild ([place])   . Diabetes mellitus without complication ([place])   . Schizophrenia ([place])    History reviewed. No pertinent surgical history. Family History:  Family History  Problem Relation Age of Onset  . Drug abuse Maternal Uncle    Family Psychiatric  History: Please see H&P.  Social History:  History  Alcohol Use No     History  Drug Use No    Social History   Social History  . Marital status: Legally Separated     Spouse name: N/A  . Number of children: N/A  . Years of education: N/A   Social History Main Topics  . Smoking status: Never Smoker  . Smokeless tobacco: Never Used  . Alcohol use No  . Drug use: No  . Sexual activity: Not Currently   Other Topics Concern  . None   Social History Narrative  . None   Additional Social History:    Pain Medications: See MAR  Prescriptions: See MAR Over the Counter: See MAR History of alcohol / drug use?: No history of alcohol / drug abuse                    Sleep: restless  Appetite:  Fair  Current Medications: Current Facility-Administered Medications  Medication Dose Route Frequency Provider Last Rate Last Dose  . acetaminophen (TYLENOL) tablet 650 mg  650 mg Oral Q6H PRN Benjamine Mola, FNP      . alum & mag hydroxide-simeth (MAALOX/MYLANTA) 200-200-20 MG/5ML suspension 30 mL  30 mL Oral Q4H PRN Benjamine Mola, FNP   30 mL at 01/07/16 1038  . ARIPiprazole (ABILIFY) tablet 25 mg  25 mg Oral Q supper Ursula Alert, MD      . ARIPiprazole ER SRER 400 mg  400 mg Intramuscular Q28 days Ursula Alert, MD   400 mg at 01/07/16 0943  . doxepin (SINEQUAN) capsule 25 mg  25 mg Oral QHS Ursula Alert, MD      . feeding  supplement (GLUCERNA SHAKE) (GLUCERNA SHAKE) liquid 237 mL  237 mL Oral TID BM Khadeja Abt, MD   237 mL at 01/07/16 1037  . hydrOXYzine (ATARAX/VISTARIL) tablet 25 mg  25 mg Oral TID PRN Benjamine Mola, FNP   25 mg at 12/22/15 2125  . hydrOXYzine (ATARAX/VISTARIL) tablet 50 mg  50 mg Oral QHS Savanna Dooley, MD      . insulin aspart (novoLOG) injection 0-15 Units  0-15 Units Subcutaneous TID WC Ursula Alert, MD   0 Units at 12/26/15 1225  . insulin aspart (novoLOG) injection 0-5 Units  0-5 Units Subcutaneous QHS Ursula Alert, MD   Stopped at 12/23/15 2203  . magnesium hydroxide (MILK OF MAGNESIA) suspension 30 mL  30 mL Oral Daily PRN Benjamine Mola, FNP      . metFORMIN (GLUCOPHAGE) tablet 500 mg  500 mg Oral Q  breakfast Benjamine Mola, FNP   500 mg at 01/07/16 [id number]  . multivitamin with minerals tablet 1 tablet  1 tablet Oral Daily Ursula Alert, MD   1 tablet at 01/07/16 0828  . OLANZapine (ZYPREXA) tablet 10 mg  10 mg Oral TID PRN Ursula Alert, MD       Or  . OLANZapine (ZYPREXA) injection 10 mg  10 mg Intramuscular TID PRN Ursula Alert, MD      . oxybutynin (DITROPAN-XL) 24 hr tablet 10 mg  10 mg Oral QHS Benjamine Mola, FNP   10 mg at 01/06/16 2128    Lab Results:  Results for orders placed or performed during the hospital encounter of 12/21/15 (from the past 48 hour(s))  Glucose, capillary     Status: Abnormal   Collection Time: 01/07/16  9:24 AM  Result Value Ref Range   Glucose-Capillary 138 (H) 65 - 99 mg/dL    Blood Alcohol level:  Lab Results  Component Value Date   ETH <5 12/20/2015   ETH <5 123XX123    Metabolic Disorder Labs: Lab Results  Component Value Date   HGBA1C 5.6 07/25/2015   MPG 114 07/25/2015   MPG 137 02/01/2015   Lab Results  Component Value Date   PROLACTIN 46.5 (H) 07/25/2015   PROLACTIN 21.6 02/01/2015   Lab Results  Component Value Date   CHOL 135 07/25/2015   TRIG 59 07/25/2015   HDL 50 07/25/2015   CHOLHDL 2.7 07/25/2015   VLDL 12 07/25/2015   LDLCALC 73 07/25/2015   LDLCALC 78 02/01/2015    Physical Findings: AIMS: Facial and Oral Movements Muscles of Facial Expression: None, normal Lips and Perioral Area: None, normal Jaw: None, normal Tongue: None, normal,Extremity Movements Upper (arms, wrists, hands, fingers): None, normal Lower (legs, knees, ankles, toes): None, normal, Trunk Movements Neck, shoulders, hips: None, normal, Overall Severity Severity of abnormal movements (highest score from questions above): None, normal Incapacitation due to abnormal movements: None, normal Patient's awareness of abnormal movements (rate only patient's report): No Awareness, Dental Status Current problems with teeth and/or dentures?:  No Does patient usually wear dentures?: No  CIWA:    COWS:     Musculoskeletal: Strength & Muscle Tone: within normal limits Gait & Station: normal Patient leans: N/A  Psychiatric Specialty Exam: Physical Exam  Nursing note and vitals reviewed.   Review of Systems  Psychiatric/Behavioral: Positive for hallucinations. The patient is nervous/anxious and has insomnia.   All other systems reviewed and are negative.   Blood pressure 119/62, pulse (!) 110, temperature 97.8 F (36.6 C), temperature source Oral, resp. rate 16, height 5'  4" (1.626 m), weight 68 kg (150 lb), SpO2 100 %.Body mass index is 25.75 kg/m.  General Appearance: Guarded  Eye Contact:  Minimal  Speech:  Normal Rate  Volume:  Normal  Mood:  Anxious, Depressed and Irritable on and off -has several complaints today about medications, staff and physical  Affect:  Congruent  Thought Process:  Goal Directed and Descriptions of Associations: Circumstantial  Orientation:  Other:  place, situation  Thought Content:  Delusions, Paranoid Ideation and Rumination  Suicidal Thoughts:  No  Homicidal Thoughts:  No  Memory:  Immediate;   Fair Recent;   Fair Remote;   Fair  Judgement:  Impaired  Insight:  Shallow  Psychomotor Activity:  Decreased  Concentration:  Concentration: Fair and Attention Span: Fair  Recall:  [employer] of Knowledge:  Fair  Language:  Fair  Akathisia:  No  Handed:  Right  AIMS (if indicated):     Assets:  Desire for Improvement  ADL's:  Intact  Cognition:  WNL  Sleep:  Number of Hours: 3.75     Treatment Plan Summary:Patient today continues to be somatic , delusional , has sleep issues . Will readjust medications. Agrees to take her [place] - Abilify Maintena. Continue treatment.    Schizoaffective disorder, bipolar type ([place]) unstable - improving  Will continue today 01/07/16  plan as below except where it is noted.   Daily contact with patient to assess and evaluate symptoms and  progress in treatment and Medication management For Psychosis: Increase Abilify to 25 mg po - change to qpm.- due to feeling tired. Abilify Maintena IM 400 mg - first dose 01/07/16 Repeat q28 days.  For Mood lability: Will continue Abilify for the same . Discontinued  Depakote for lack of efficacy and ADRs.  For anxiety/agitation: PRN medications as per agitation protocol.  For insomnia (and depression ) Discontinued Ambien due to vivid dreams. Failed remeron, did not like trazodone. Will increase Doxepin to 25 mg po qhs. Add Vistaril 50 mg po qhs.   Will continue home medications as indicated.  CSW will continue to work on disposition.   I certify that the services received since the previous certification/recertification were and continue to be medically necessary as the treatment provided can be reasonably expected to improve the patient's condition; the medical record documents that the services furnished were intensive treatment services or their equivalent services, and this patient continues to need, on a daily basis, active treatment furnished directly by or requiring the supervision of inpatient psychiatric personnel.    [name] Mcloughlin, MD 01/07/2016, 1:17 PM

## 2016-01-08 LAB — GLUCOSE, CAPILLARY
GLUCOSE-CAPILLARY: 93 mg/dL (ref 65–99)
GLUCOSE-CAPILLARY: 93 mg/dL (ref 65–99)

## 2016-01-08 MED ORDER — TEMAZEPAM 15 MG PO CAPS
15.0000 mg | ORAL_CAPSULE | Freq: Every day | ORAL | Status: DC
  Administered 2016-01-08: 15 mg via ORAL
  Filled 2016-01-08: qty 1

## 2016-01-08 NOTE — Progress Notes (Signed)
Patient ID: [name] [name], female   DOB: 1980-02-18, 36 y.o.   MRN: [id number] PER STATE REGULATIONS 482.30  THIS CHART WAS REVIEWED FOR MEDICAL NECESSITY WITH RESPECT TO THE PATIENT'S ADMISSION/ DURATION OF STAY.  NEXT REVIEW DATE: 01/09/2015  Chauncy Lean, RN, BSN CASE MANAGER

## 2016-01-08 NOTE — Progress Notes (Signed)
Adult Psychoeducational Group Note  Date:  01/08/2016 Time:  9:33 PM  Group Topic/Focus:  Wrap-Up Group:   The focus of this group is to help patients review their daily goal of treatment and discuss progress on daily workbooks.   Participation Level:  Active  Participation Quality:  Appropriate  Affect:  Appropriate  Cognitive:  Alert  Insight: Appropriate  Engagement in Group:  Engaged  Modes of Intervention:  Discussion  Additional Comments:  Patient states, "my day was well".  Damali Broadfoot L Jobanny Mavis 01/08/2016, 9:33 PM

## 2016-01-08 NOTE — Progress Notes (Signed)
D: Pt denies SI/HI/AVH. Pt is pleasant and cooperative. Pt stated she was ready to go. Pt isolated to room but came to med window for her medication.  A: Pt was offered support and encouragement. Pt was given scheduled medications. Pt was encourage to attend groups. Q 15 minute checks were done for safety.    R: Pt is taking medication. Pt has no complaints.Pt receptive to treatment and safety maintained on unit.

## 2016-01-08 NOTE — Progress Notes (Signed)
Recreation Therapy Notes  Date: 01/08/16 Time: 1000 Location: [address] Day[address]   Group Topic: Communication, Team Building, Problem Solving  Goal Area(s) Addresses:  Patient will effectively work with peer towards shared goal.  Patient will identify skill used to make activity successful.  Patient will identify how skills used during activity can be used to reach post d/c goals.   Intervention: STEM Activity   Activity: [employer]. Patients were provided the following materials: 5 drinking straws, 5 rubber bands, 5 paper clips, 2 index cards, 2 drinking cups, and 2 toilet paper rolls. Using the provided materials patients were asked to build a launching mechanisms to launch a ping pong ball approximately 12 feet. Patients were divided into teams of 3-5.   Education:Social Skills, Discharge Planning.   Education Outcome: Acknowledges education/In group clarification offered/Needs additional education.   Clinical Observations/Feedback: Pt did not attend group.   Victorino Sparrow, LRT/CTRS      Victorino Sparrow A 01/08/2016 11:53 AM

## 2016-01-08 NOTE — Plan of Care (Signed)
Problem: Safety: Goal: Periods of time without injury will increase Outcome: Progressing Pt safe on the unit at this time   

## 2016-01-08 NOTE — Progress Notes (Signed)
[facility] MD Progress Note  01/08/2016 1:27 PM [name] [name]  MRN:  [id number] Subjective: Patient states " I did not sleep last night , I felt a choking sensation and I was anxious.'      Objective: Patient seen and chart reviewed.Discussed patient with treatment team.  Pt seen as alert, oriented . Pt continues to have AH on and off- but is improved. Pt is distressed about not being able to sleep last night and feels like her bedtime medications are making her worse. Discussed other sleep aids- she failed several trials of medications for sleep this admission  , continues to have sleep issues. Continue treatment.     Principal Problem: Schizoaffective disorder, bipolar type ([place]) Diagnosis:   Patient Active Problem List   Diagnosis Date Noted  . Insomnia [G47.00]   . Anxiety state [F41.1]   . Overactive bladder [N32.81]   . Diabetes mellitus ([place]) [E11.9] 02/08/2015  . Schizoaffective disorder, bipolar type ([place]) [F25.0] 01/28/2015  . Non compliance w medication regimen [Z91.14]    Total Time spent with patient: 25 minutes  Past Psychiatric History: Please see H&P.  Past Medical History:  Past Medical History:  Diagnosis Date  . Bipolar affective disorder, currently manic, mild ([place])   . Diabetes mellitus without complication ([place])   . Schizophrenia ([place])    History reviewed. No pertinent surgical history. Family History:  Family History  Problem Relation Age of Onset  . Drug abuse Maternal Uncle    Family Psychiatric  History: Please see H&P.  Social History:  History  Alcohol Use No     History  Drug Use No    Social History   Social History  . Marital status: Legally Separated    Spouse name: N/A  . Number of children: N/A  . Years of education: N/A   Social History Main Topics  . Smoking status: Never Smoker  . Smokeless tobacco: Never Used  . Alcohol use No  . Drug use: No  . Sexual activity: Not Currently   Other Topics Concern  . None    Social History Narrative  . None   Additional Social History:    Pain Medications: See MAR  Prescriptions: See MAR Over the Counter: See MAR History of alcohol / drug use?: No history of alcohol / drug abuse                    Sleep: Poor  Appetite:  Fair  Current Medications: Current Facility-Administered Medications  Medication Dose Route Frequency Provider Last Rate Last Dose  . acetaminophen (TYLENOL) tablet 650 mg  650 mg Oral Q6H PRN Benjamine Mola, FNP   650 mg at 01/07/16 1756  . alum & mag hydroxide-simeth (MAALOX/MYLANTA) 200-200-20 MG/5ML suspension 30 mL  30 mL Oral Q4H PRN Benjamine Mola, FNP   30 mL at 01/07/16 1038  . ARIPiprazole (ABILIFY) tablet 25 mg  25 mg Oral Q supper Ursula Alert, MD   25 mg at 01/07/16 1754  . ARIPiprazole ER SRER 400 mg  400 mg Intramuscular Q28 days Ursula Alert, MD   400 mg at 01/07/16 0943  . feeding supplement (GLUCERNA SHAKE) (GLUCERNA SHAKE) liquid 237 mL  237 mL Oral TID BM Deseri Loss, MD   237 mL at 01/08/16 1102  . hydrOXYzine (ATARAX/VISTARIL) tablet 25 mg  25 mg Oral TID PRN Benjamine Mola, FNP   25 mg at 12/22/15 2125  . hydrOXYzine (ATARAX/VISTARIL) tablet 50 mg  50 mg Oral QHS  Ursula Alert, MD   50 mg at 01/07/16 2127  . insulin aspart (novoLOG) injection 0-15 Units  0-15 Units Subcutaneous TID WC Ursula Alert, MD   0 Units at 12/26/15 1225  . insulin aspart (novoLOG) injection 0-5 Units  0-5 Units Subcutaneous QHS Ursula Alert, MD   Stopped at 12/23/15 2203  . magnesium hydroxide (MILK OF MAGNESIA) suspension 30 mL  30 mL Oral Daily PRN Benjamine Mola, FNP      . metFORMIN (GLUCOPHAGE) tablet 500 mg  500 mg Oral Q breakfast Benjamine Mola, FNP   500 mg at 01/08/16 0756  . multivitamin with minerals tablet 1 tablet  1 tablet Oral Daily Ursula Alert, MD   1 tablet at 01/08/16 0755  . OLANZapine (ZYPREXA) tablet 10 mg  10 mg Oral TID PRN Ursula Alert, MD       Or  . OLANZapine (ZYPREXA) injection 10 mg   10 mg Intramuscular TID PRN Ursula Alert, MD      . oxybutynin (DITROPAN-XL) 24 hr tablet 10 mg  10 mg Oral QHS Benjamine Mola, FNP   10 mg at 01/07/16 2127  . temazepam (RESTORIL) capsule 15 mg  15 mg Oral QHS Ursula Alert, MD        Lab Results:  Results for orders placed or performed during the hospital encounter of 12/21/15 (from the past 48 hour(s))  Glucose, capillary     Status: Abnormal   Collection Time: 01/07/16  9:24 AM  Result Value Ref Range   Glucose-Capillary 138 (H) 65 - 99 mg/dL  Glucose, capillary     Status: None   Collection Time: 01/07/16  5:33 PM  Result Value Ref Range   Glucose-Capillary 91 65 - 99 mg/dL  Glucose, capillary     Status: Abnormal   Collection Time: 01/07/16 10:52 PM  Result Value Ref Range   Glucose-Capillary 109 (H) 65 - 99 mg/dL  Glucose, capillary     Status: None   Collection Time: 01/08/16 12:11 PM  Result Value Ref Range   Glucose-Capillary 93 65 - 99 mg/dL    Blood Alcohol level:  Lab Results  Component Value Date   ETH <5 12/20/2015   ETH <5 123XX123    Metabolic Disorder Labs: Lab Results  Component Value Date   HGBA1C 5.6 07/25/2015   MPG 114 07/25/2015   MPG 137 02/01/2015   Lab Results  Component Value Date   PROLACTIN 46.5 (H) 07/25/2015   PROLACTIN 21.6 02/01/2015   Lab Results  Component Value Date   CHOL 135 07/25/2015   TRIG 59 07/25/2015   HDL 50 07/25/2015   CHOLHDL 2.7 07/25/2015   VLDL 12 07/25/2015   LDLCALC 73 07/25/2015   LDLCALC 78 02/01/2015    Physical Findings: AIMS: Facial and Oral Movements Muscles of Facial Expression: None, normal Lips and Perioral Area: None, normal Jaw: None, normal Tongue: None, normal,Extremity Movements Upper (arms, wrists, hands, fingers): None, normal Lower (legs, knees, ankles, toes): None, normal, Trunk Movements Neck, shoulders, hips: None, normal, Overall Severity Severity of abnormal movements (highest score from questions above): None,  normal Incapacitation due to abnormal movements: None, normal Patient's awareness of abnormal movements (rate only patient's report): No Awareness, Dental Status Current problems with teeth and/or dentures?: No Does patient usually wear dentures?: No  CIWA:    COWS:     Musculoskeletal: Strength & Muscle Tone: within normal limits Gait & Station: normal Patient leans: N/A  Psychiatric Specialty Exam: Physical Exam  Nursing  note and vitals reviewed.   Review of Systems  Psychiatric/Behavioral: Positive for hallucinations. The patient is nervous/anxious and has insomnia.   All other systems reviewed and are negative.   Blood pressure 95/70, pulse 94, temperature 98.7 F (37.1 C), temperature source Oral, resp. rate 16, height 5\' 4"  (1.626 m), weight 68 kg (150 lb), SpO2 100 %.Body mass index is 25.75 kg/m.  General Appearance: Guarded  Eye Contact:  Fair  Speech:  Normal Rate  Volume:  Normal  Mood:  Anxious and Depressed improving  Affect:  Congruent  Thought Process:  Goal Directed and Descriptions of Associations: Circumstantial  Orientation:  Other:  place, situation  Thought Content:  Rumination - AH - on and off  Suicidal Thoughts:  No  Homicidal Thoughts:  No  Memory:  Immediate;   Fair Recent;   Fair Remote;   Fair  Judgement:  Impaired  Insight:  Shallow  Psychomotor Activity:  Decreased  Concentration:  Concentration: Fair and Attention Span: Fair  Recall:  [employer] of Knowledge:  Fair  Language:  Fair  Akathisia:  No  Handed:  Right  AIMS (if indicated):     Assets:  Desire for Improvement  ADL's:  Intact  Cognition:  WNL  Sleep:  Number of Hours: 1.5     Treatment Plan Summary:Patient today with sleep issues - will readjust her medications. Continue treatment.    Schizoaffective disorder, bipolar type ([place]) unstable - improving  Will continue today 01/08/16  plan as below except where it is noted.   Daily contact with patient to assess and  evaluate symptoms and progress in treatment and Medication management For Psychosis: Increased Abilify to 25 mg po - change to qpm.- due to feeling tired. Abilify Maintena IM 400 mg - first dose 01/07/16 Repeat q28 days.  For Mood lability: Will continue Abilify for the same . Discontinued  Depakote for lack of efficacy and ADRs.  For anxiety/agitation: PRN medications as per agitation protocol.  For insomnia (and depression ) Discontinued Ambien due to vivid dreams. Failed remeron, did not like trazodone.Will discontinue Doxepin for lack of efficacy. Will start Restoril 15 mg po qhs. Added Vistaril 50 mg po qhs.   Will continue home medications as indicated.  CSW will continue to work on disposition.   I certify that the services received since the previous certification/recertification were and continue to be medically necessary as the treatment provided can be reasonably expected to improve the patient's condition; the medical record documents that the services furnished were intensive treatment services or their equivalent services, and this patient continues to need, on a daily basis, active treatment furnished directly by or requiring the supervision of inpatient psychiatric personnel.    Kathia Covington, MD 01/08/2016, 1:27 PM

## 2016-01-08 NOTE — BHH Group Notes (Signed)
[place] [facility] LCSW Group Therapy  01/08/2016 , 2:52 PM   Type of Therapy:  Group Therapy  Participation Level:  Active  Participation Quality:  Attentive  Affect:  Appropriate  Cognitive:  Alert  Insight:  Improving  Engagement in Therapy:  Engaged  Modes of Intervention:  Discussion, Exploration and Socialization  Summary of Progress/Problems: Today's group focused on the term Diagnosis.  Participants were asked to define the term, and then pronounce whether it is a negative, positive or neutral term.  Stayed the entire time, engaged throughout.  Rather tangential; mood good, upbeat.  "I am able to concentrate today, and I got out of my room to come to group.  That's good, right?"  Went on tangents about coffee, medications, alone time and Bible quotes.  Is concerned that she will not be d/ced tomorrow, and also worried a switch in meds for sleep will cause her to lose her concentration.  [name] [name] 01/08/2016 , 2:52 PM

## 2016-01-08 NOTE — Progress Notes (Signed)
Recreation Therapy Notes  Animal-Assisted Activity (AAA) Program Checklist/Progress Notes Patient Eligibility Criteria Checklist & Daily Group note for Rec Tx Intervention  Date: 01.02.2018 Time: 2:45pm Location: [address]    AAA/T Program Assumption of Risk Form signed by Patient/ or Parent Legal Guardian No  Clinical Observations/Feedback: Patient discussed with MD for appropriateness in pet therapy session. Both LRT and MD agree patient is appropriate for participation. Patient offered participation in session, but politely declined. No consent form signed by patient.   Laureen Ochs Mary Hockey, LRT/CTRS  Cristo Ausburn L 01/08/2016 3:14 PM

## 2016-01-09 LAB — GLUCOSE, CAPILLARY: GLUCOSE-CAPILLARY: 118 mg/dL — AB (ref 65–99)

## 2016-01-09 MED ORDER — HYDROXYZINE HCL 25 MG PO TABS: 25.0000 mg | ORAL_TABLET | Freq: Three times a day (TID) | ORAL | 0 refills | Status: DC | PRN

## 2016-01-09 MED ORDER — TEMAZEPAM 15 MG PO CAPS: 15.0000 mg | ORAL_CAPSULE | Freq: Every day | ORAL | 0 refills | Status: DC

## 2016-01-09 MED ORDER — ARIPIPRAZOLE ER 400 MG IM SRER: 400.0000 mg | INTRAMUSCULAR | 0 refills | Status: DC

## 2016-01-09 MED ORDER — OXYBUTYNIN CHLORIDE ER 10 MG PO TB24: 10.0000 mg | ORAL_TABLET | Freq: Every day | ORAL | 0 refills | Status: DC

## 2016-01-09 MED ORDER — METFORMIN HCL 500 MG PO TABS: 500.0000 mg | ORAL_TABLET | Freq: Every day | ORAL | 0 refills | Status: DC

## 2016-01-09 MED ORDER — ARIPIPRAZOLE 5 MG PO TABS: 25.0000 mg | ORAL_TABLET | Freq: Every day | ORAL | 0 refills | Status: DC

## 2016-01-09 NOTE — Progress Notes (Signed)
Recreation Therapy Notes  Date: 01/09/16 Time: 1000 Location: [address] Day[address]  Group Topic: Coping Skills  Goal Area(s) Addresses:  Patients will be able to identify positive coping skills. Patients will be able to identify the benefits of positive coping skills. Patients will be able to identity how using positive coping skills will help them post d/c.  Behavioral Response: Engaged  Intervention: Psychologist, forensic, pencils  Activity: Building surveyor.  Patients were to write in the center of web what brought them to the hospital.  Patients were to write coping skills along the lines of the web that can help them deal with the issues that brought them here.  Education: Radiographer, therapeutic, Dentist.   Education Outcome: Acknowledges understanding/In group clarification offered/Needs additional education.   Clinical Observations/Feedback: Pt arrived late.  Pt stated she was here because she told her dad she would come to the hospital for the rest of her life.  Pt identified her coping skills as meditation, prayer, exercise, coloring and writing down scripture that helps her change her ways.  Pt expressed using her coping skills helps her "ponder, control her thoughts, not defeat self, improve self-esteem and realize everyone is valuable and there are people willing to help you".  Pt stated if one of her coping skills doesn't work, she will try another one "becasue it makes you realize some old strategies make you exercise your mind and start a new beginning".    [name] [name], LRT/CTRS         [name] [name] A 01/09/2016 12:19 PM

## 2016-01-09 NOTE — Plan of Care (Signed)
Problem: [facility] Participation in Recreation Therapeutic Interventions Goal: STG-Patient will attend/participate in Rec Therapy Group Ses STG-The Patient will attend and participate in Recreation Therapy Group Sessions  Outcome: Adequate for Discharge Pt attended and participated in coping skills recreation therapy session.  Victorino Sparrow, LRT/CTRS

## 2016-01-09 NOTE — Discharge Summary (Signed)
Physician Discharge Summary Note  Patient:  [name] [name] is an 36 y.o., female MRN:  [id number] DOB:  1980-05-06 Patient phone:  [phone] (home)  Patient address:   [address] [place]  [id number],  Total Time spent with patient: 30 minutes  Date of Admission:  12/21/2015 Date of Discharge: 01/09/2016  Reason for Admission:  psychosis  Principal Problem: Schizoaffective disorder, bipolar type [facility]) Discharge Diagnoses: Patient Active Problem List   Diagnosis Date Noted  . Insomnia [G47.00]   . Anxiety state [F41.1]   . Overactive bladder [N32.81]   . Diabetes mellitus ([place]) [E11.9] 02/08/2015  . Schizoaffective disorder, bipolar type ([place]) [F25.0] 01/28/2015  . Non compliance w medication regimen [Z91.14]     Past Psychiatric History: see HPI  Past Medical History:  Past Medical History:  Diagnosis Date  . Bipolar affective disorder, currently manic, mild ([place])   . Diabetes mellitus without complication ([place])   . Schizophrenia ([place])    History reviewed. No pertinent surgical history. Family History:  Family History  Problem Relation Age of Onset  . Drug abuse Maternal Uncle    Family Psychiatric  History: see HPI Social History:  History  Alcohol Use No     History  Drug Use No    Social History   Social History  . Marital status: Legally Separated    Spouse name: N/A  . Number of children: N/A  . Years of education: N/A   Social History Main Topics  . Smoking status: Never Smoker  . Smokeless tobacco: Never Used  . Alcohol use No  . Drug use: No  . Sexual activity: Not Currently   Other Topics Concern  . None   Social History Narrative  . None    Hospital Course:  [name] [name] a  36 y.o.female,who has a hx of schizoaffective disorder , well known to [facility], has a hx of being noncompliant on medications, presented IVCed to [facility] for psychosis.  [name] [name] was admitted for Schizoaffective disorder, bipolar  type ([place]) and crisis management.  Patient was treated with medications with their indications listed below in detail under Medication List.  Medical problems were identified and treated as needed.  Home medications were restarted as appropriate.  Improvement was monitored by observation and [name] [name].  Emotional and mental status was monitored by daily self inventory reports completed by [name] [name] and clinical staff.  Patient reported continued improvement, denied any new concerns.  Patient had been compliant on medications and denied side effects.  Support and encouragement was provided.         [name] [name] was evaluated by the treatment team for stability and plans for continued recovery upon discharge.  Patient was offered further treatment options upon discharge including Residential, Intensive Outpatient and Outpatient treatment. Patient will follow up with agency listed below for medication management and counseling.  Encouraged patient to maintain satisfactory support network and home environment.  Advised to adhere to medication compliance and outpatient treatment follow up.  Prescriptions provided.       [name] [name] motivation was an integral factor for scheduling further treatment.  Employment, transportation, bed availability, health status, family support, and any pending legal issues were also considered during patient's hospital stay.  Upon completion of this admission the patient was both mentally and medically stable for discharge denying suicidal/homicidal ideation, auditory/visual/tactile hallucinations, delusional thoughts and paranoia.      Physical Findings: AIMS: Facial and Oral Movements Muscles of Facial Expression: None,  normal Lips and Perioral Area: None, normal Jaw: None, normal Tongue: None, normal,Extremity Movements Upper (arms, wrists, hands, fingers): None, normal Lower (legs, knees, ankles,  toes): None, normal, Trunk Movements Neck, shoulders, hips: None, normal, Overall Severity Severity of abnormal movements (highest score from questions above): None, normal Incapacitation due to abnormal movements: None, normal Patient's awareness of abnormal movements (rate only patient's report): No Awareness, Dental Status Current problems with teeth and/or dentures?: No Does patient usually wear dentures?: No  CIWA:    COWS:     Musculoskeletal: Strength & Muscle Tone: within normal limits Gait & Station: normal Patient leans: N/A  Psychiatric Specialty Exam: Physical Exam  Nursing note and vitals reviewed. Psychiatric: She has a normal mood and affect. Her behavior is normal. Thought content normal. Cognition and memory are normal.    ROS  Blood pressure 108/80, pulse (!) 119, temperature 97.9 F (36.6 C), temperature source Oral, resp. rate 18, height 5\' 4"  (1.626 m), weight 68 kg (150 lb), SpO2 100 %.Body mass index is 25.75 kg/m.   Have you used any form of tobacco in the last 30 days? (Cigarettes, Smokeless Tobacco, Cigars, and/or Pipes): No  Has this patient used any form of tobacco in the last 30 days? (Cigarettes, Smokeless Tobacco, Cigars, and/or Pipes) Yes, N/A  Blood Alcohol level:  Lab Results  Component Value Date   [facility] <5 12/20/2015   ETH <5 [id number]    Metabolic Disorder Labs:  Lab Results  Component Value Date   HGBA1C 5.6 07/25/2015   MPG 114 07/25/2015   MPG 137 02/01/2015   Lab Results  Component Value Date   PROLACTIN 46.5 (H) 07/25/2015   PROLACTIN 21.6 02/01/2015   Lab Results  Component Value Date   CHOL 135 07/25/2015   TRIG 59 07/25/2015   HDL 50 07/25/2015   CHOLHDL 2.7 07/25/2015   VLDL 12 07/25/2015   LDLCALC 73 07/25/2015   LDLCALC 78 02/01/2015    See Psychiatric Specialty Exam and Suicide Risk Assessment completed by Attending Physician prior to discharge.  Discharge destination:  Home  Is patient on multiple  antipsychotic therapies at discharge:  No   Has Patient had three or more failed trials of antipsychotic monotherapy by history:  No  Recommended Plan for Multiple Antipsychotic Therapies: NA   Allergies as of 01/09/2016   No Known Allergies     Medication List    STOP taking these medications   divalproex 500 MG 24 hr tablet Commonly known as:  DEPAKOTE ER   paliperidone 156 MG/ML Susp injection Commonly known as:  INVEGA SUSTENNA   paliperidone 6 MG 24 hr tablet Commonly known as:  INVEGA   pantoprazole 40 MG tablet Commonly known as:  PROTONIX     TAKE these medications     Indication  ARIPiprazole 5 MG tablet Commonly known as:  ABILIFY Take 5 tablets (25 mg total) by mouth daily with supper.  Indication:  mood stabilization   ARIPiprazole ER 400 MG Srer Inject 400 mg into the muscle every 28 (twenty-eight) days. Next dose due 1/28 Start taking on:  02/03/2016  Indication:  mood stabilization   hydrOXYzine 25 MG tablet Commonly known as:  ATARAX/VISTARIL Take 1 tablet (25 mg total) by mouth 3 (three) times daily as needed for anxiety.  Indication:  Anxiety Neurosis   metFORMIN 500 MG tablet Commonly known as:  GLUCOPHAGE Take 1 tablet (500 mg total) by mouth daily with breakfast. Start taking on:  01/10/2016  Indication:  Type  2 Diabetes   oxybutynin 10 MG 24 hr tablet Commonly known as:  DITROPAN-XL Take 1 tablet (10 mg total) by mouth at bedtime.  Indication:  Difficult or Painful Urination, Frequent Urination   temazepam 15 MG capsule Commonly known as:  RESTORIL Take 1 capsule (15 mg total) by mouth at bedtime.  Indication:  Trouble Sleeping        Follow-up recommendations:  Activity:  as tol Diet:  as tol  Comments:  1.  Take all your medications as prescribed.   2.  Report any adverse side effects to outpatient provider. 3.  Patient instructed to not use alcohol or illegal drugs while on prescription medicines. 4.  In the event of worsening  symptoms, instructed patient to call 911, the crisis hotline or go to nearest emergency room for evaluation of symptoms.  Signed: Janett Labella, NP [facility] 01/09/2016, 11:10 AM   Patient seen, Suicide Assessment Completed.  Disposition Plan Reviewed

## 2016-01-09 NOTE — Progress Notes (Signed)
  [facility] Adult Case Management Discharge Plan :  Will you be returning to the same living situation after discharge:  No. At discharge, do you have transportation home?: Yes,  cab, bus ticket to [place] you have the ability to pay for your medications: Yes,  MCD  Release of information consent forms completed and in the chart;  Patient's signature needed at discharge.  Patient to Follow up at: Follow-up Information    [facility] Follow up on 01/15/2016.   Why:  Tuesday at [place] for your hospital follow up appointment Contact information: [address]  [place] [phone]          Next level of care provider has access to [place] and Suicide Prevention discussed: Yes,  yes  Have you used any form of tobacco in the last 30 days? (Cigarettes, Smokeless Tobacco, Cigars, and/or Pipes): No  Has patient been referred to the Quitline?: N/A patient is not a smoker  Patient has been referred for addiction treatment: [place] 01/09/2016, 11:21 AM

## 2016-01-09 NOTE — BHH Suicide Risk Assessment (Signed)
[facility] Discharge Suicide Risk Assessment   Principal Problem: Schizoaffective disorder, bipolar type [facility]) Discharge Diagnoses:  Patient Active Problem List   Diagnosis Date Noted  . Insomnia [G47.00]   . Anxiety state [F41.1]   . Overactive bladder [N32.81]   . Diabetes mellitus ([place]) [E11.9] 02/08/2015  . Schizoaffective disorder, bipolar type ([place]) [F25.0] 01/28/2015  . Non compliance w medication regimen [Z91.14]     Total Time spent with patient: 30 minutes  Musculoskeletal: Strength & Muscle Tone: within normal limits Gait & Station: normal Patient leans: N/A  Psychiatric Specialty Exam: ROS denies chest pain, no shortness of breath, no vomiting  Blood pressure 108/80, pulse (!) 119, temperature 97.9 F (36.6 C), temperature source Oral, resp. rate 18, height 5\' 4"  (1.626 m), weight 68 kg (150 lb), SpO2 100 %.Body mass index is 25.75 kg/m.  General Appearance: improved grooming   Eye Contact::  Fair  Speech:  Normal Rate409  Volume:  Normal  Mood:  reports she is feeling better, does not endorse depression at this time  Affect:  more reactive, less blunted, smiles at times appropriately   Thought Process:  ,better organized  Orientation:  Full (Time, Place, and Person)  Thought Content:  reports decrease in hallucinations, although not completely resolved, denies command hallucinations, no delusions expressed, at this time does not appear internally preoccupied   Suicidal Thoughts:  No denies any suicidal or self injurious ideations, denies any homicidal or violent ideations  Homicidal Thoughts:  No  Memory:  recent and remote grossly intact   Judgement:  Other:  improved compared to admission  Insight:  improved compared to admission  Psychomotor Activity:  normal  Concentration:  Good  Recall:  Good  Fund of Knowledge:Good  Language: Good  Akathisia:  Negative  Handed:  Right  AIMS (if indicated):   no abnormal involuntary movements noted or reported   Assets:   Desire for Improvement Resilience  Sleep:  Number of Hours: 4.25  Cognition: WNL  ADL's: improved compared to admission   Mental Status Per Nursing Assessment::   On Admission:  NA  Demographic Factors:  36 year old female  Loss Factors: Chronic mental illness, disability, limited support system  Historical Factors: History of chronic mental illness- has been diagnosed with Schizoaffective Disorder  Risk Reduction Factors:   Living with another person, especially a relative and Positive coping skills or problem solving skills  Continued Clinical Symptoms:  At this time patient is improved compared to admission- presents alert, attentive, calm, pleasant on approach, mood described as improved, does not endorse depression at this time, affect is more reactive, thought process is more linear and better organized,no suicidal or self injurious ideations , denies any homicidal or violent ideations, reports significant decrease of hallucinatory experiences and does not appear internally preoccupied at this time. Denies medication side effects at this time, and no abnormal or involuntary movements are noted or reported   Cognitive Features That Contribute To Risk:  No gross cognitive deficits noted upon discharge. Is alert , attentive, and oriented x 3   Suicide Risk:  Mild:  Suicidal ideation of limited frequency, intensity, duration, and specificity.  There are no identifiable plans, no associated intent, mild dysphoria and related symptoms, good self-control (both objective and subjective assessment), few other risk factors, and identifiable protective factors, including available and accessible social support.  Follow-up Information    [facility] Follow up on 01/15/2016.   Why:  Tuesday at [place] for your hospital follow up appointment Contact  information: [address]  [place] recommendations:  Activity:  as tolerated   Diet:  Diabetic Diet Tests:  NA Other:  See below  Patient is leaving unit in good spirits  Plans to follow up as above    Neita Garnet, MD 01/09/2016, 12:08 PM

## 2016-01-09 NOTE — Tx Team (Signed)
Interdisciplinary Treatment and Diagnostic Plan Update  01/09/2016 Time of Session: 8:43 AM  [name] [name] MRN: [id number]  Principal Diagnosis: Schizoaffective disorder, bipolar type ([place])  Secondary Diagnoses: Principal Problem:   Schizoaffective disorder, bipolar type ([place]) Active Problems:   Non compliance w medication regimen   Diabetes mellitus ([place])   Current Medications:  Current Facility-Administered Medications  Medication Dose Route Frequency Provider Last Rate Last Dose  . acetaminophen (TYLENOL) tablet 650 mg  650 mg Oral Q6H PRN Benjamine Mola, FNP   650 mg at 01/07/16 1756  . alum & mag hydroxide-simeth (MAALOX/MYLANTA) 200-200-20 MG/5ML suspension 30 mL  30 mL Oral Q4H PRN Benjamine Mola, FNP   30 mL at 01/07/16 1038  . ARIPiprazole (ABILIFY) tablet 25 mg  25 mg Oral Q supper Ursula Alert, MD   25 mg at 01/08/16 1838  . ARIPiprazole ER SRER 400 mg  400 mg Intramuscular Q28 days Ursula Alert, MD   400 mg at 01/07/16 0943  . feeding supplement (GLUCERNA SHAKE) (GLUCERNA SHAKE) liquid 237 mL  237 mL Oral TID BM Ursula Alert, MD   237 mL at 01/08/16 2030  . hydrOXYzine (ATARAX/VISTARIL) tablet 25 mg  25 mg Oral TID PRN Benjamine Mola, FNP   25 mg at 12/22/15 2125  . hydrOXYzine (ATARAX/VISTARIL) tablet 50 mg  50 mg Oral QHS Ursula Alert, MD   50 mg at 01/08/16 2133  . insulin aspart (novoLOG) injection 0-15 Units  0-15 Units Subcutaneous TID WC Ursula Alert, MD   0 Units at 12/26/15 1225  . insulin aspart (novoLOG) injection 0-5 Units  0-5 Units Subcutaneous QHS Ursula Alert, MD   Stopped at 12/23/15 2203  . magnesium hydroxide (MILK OF MAGNESIA) suspension 30 mL  30 mL Oral Daily PRN Benjamine Mola, FNP      . metFORMIN (GLUCOPHAGE) tablet 500 mg  500 mg Oral Q breakfast Benjamine Mola, FNP   500 mg at 01/08/16 0756  . multivitamin with minerals tablet 1 tablet  1 tablet Oral Daily Ursula Alert, MD   1 tablet at 01/08/16 0755  . OLANZapine (ZYPREXA)  tablet 10 mg  10 mg Oral TID PRN Ursula Alert, MD       Or  . OLANZapine (ZYPREXA) injection 10 mg  10 mg Intramuscular TID PRN Ursula Alert, MD      . oxybutynin (DITROPAN-XL) 24 hr tablet 10 mg  10 mg Oral QHS Benjamine Mola, FNP   10 mg at 01/08/16 2132  . temazepam (RESTORIL) capsule 15 mg  15 mg Oral QHS Ursula Alert, MD   15 mg at 01/08/16 2133    PTA Medications: Prescriptions Prior to Admission  Medication Sig Dispense Refill Last Dose  . divalproex (DEPAKOTE ER) 500 MG 24 hr tablet Take 2 tablets (1,000 mg total) by mouth at bedtime. (Patient not taking: Reported on 12/21/2015) 60 tablet 0 Not Taking at Unknown time  . hydrOXYzine (ATARAX/VISTARIL) 25 MG tablet Take 1 tablet (25 mg total) by mouth 3 (three) times daily as needed for anxiety. (Patient not taking: Reported on 12/21/2015) 30 tablet 0 Not Taking at Unknown time  . metFORMIN (GLUCOPHAGE) 500 MG tablet Take 1 tablet (500 mg total) by mouth daily with breakfast. (Patient not taking: Reported on 12/21/2015) 30 tablet 0 Not Taking at Unknown time  . oxybutynin (DITROPAN-XL) 10 MG 24 hr tablet Take 1 tablet (10 mg total) by mouth at bedtime. (Patient not taking: Reported on 12/21/2015) 30 tablet 0 Not Taking at  Unknown time  . paliperidone (INVEGA SUSTENNA) 156 MG/ML SUSP injection Inject 1 mL (156 mg total) into the muscle once. 0.9 mL 0   . paliperidone (INVEGA SUSTENNA) 156 MG/ML SUSP injection Inject 0.75 mLs (117 mg total) into the muscle every 28 (twenty-eight) days. Next dose is due 09/07/2015 (Patient not taking: Reported on 12/21/2015) 0.9 mL 0 Not Taking at Unknown time  . paliperidone (INVEGA) 6 MG 24 hr tablet Take 1 tablet (6 mg total) by mouth at bedtime. 3 tablet 0   . pantoprazole (PROTONIX) 40 MG tablet Take 1 tablet (40 mg total) by mouth daily. (Patient not taking: Reported on 12/21/2015) 30 tablet 0 Not Taking at Unknown time    Treatment Modalities: Medication Management, Group therapy, Case management,   1 to 1 session with clinician, Psychoeducation, Recreational therapy.   Physician Treatment Plan for Primary Diagnosis: Schizoaffective disorder, bipolar type ([place]) Long Term Goal(s): Improvement in symptoms so as ready for discharge  Short Term Goals: Ability to verbalize feelings will improve   Medication Management: Evaluate patient's response, side effects, and tolerance of medication regimen.  Therapeutic Interventions: 1 to 1 sessions, Unit Group sessions and Medication administration.  Evaluation of Outcomes: Adequate for Discharge  Physician Treatment Plan for Secondary Diagnosis: Principal Problem:   Schizoaffective disorder, bipolar type ([place]) Active Problems:   Non compliance w medication regimen   Diabetes mellitus ([place])   Long Term Goal(s): Improvement in symptoms so as ready for discharge  Short Term Goals:  Compliance with prescribed medications will improve  Medication Management: Evaluate patient's response, side effects, and tolerance of medication regimen.  Therapeutic Interventions: 1 to 1 sessions, Unit Group sessions and Medication administration.  Evaluation of Outcomes: Adequate for Discharge   12/21:For Psychosis: Will continue Abilify 15 mg po - change dosing time to AM. Pt refused past two doses - but took it this AM .   For Mood lability: Will change Depakote to 500 mg po bid. Pt refused it previous doses - but was able to take it this AM. Depakote level on 12/25/15- 56 ( therapeutic)   RN Treatment Plan for Primary Diagnosis: Schizoaffective disorder, bipolar type ([place]) Long Term Goal(s): Knowledge of disease and therapeutic regimen to maintain health will improve  Short Term Goals: Ability to participate in decision making will improve and Compliance with prescribed medications will improve  Medication Management: RN will administer medications as ordered by provider, will assess and evaluate patient's response and provide education  to patient for prescribed medication. RN will report any adverse and/or side effects to prescribing provider.  Therapeutic Interventions: 1 on 1 counseling sessions, Psychoeducation, Medication administration, Evaluate responses to treatment, Monitor vital signs and CBGs as ordered, Perform/monitor CIWA, COWS, AIMS and Fall Risk screenings as ordered, Perform wound care treatments as ordered.  Evaluation of Outcomes: Adequate for Discharge   Recreational Therapy Treatment Plan for Primary Diagnosis: Schizoaffective disorder, bipolar type ([place]) Long Term Goal(s):  LTG- Patient will participate in recreation therapy tx in at least 2 group sessions without prompting from LRT.  Short Term Goals:  Patient will demonstrate increased ability to follow instructions, as demonstrated by ability to follow LRT instructions on first prompt during recreation therapy group sessions.  Treatment Modalities: Group and Pet Therapy  Therapeutic Interventions: Psychoeducation  Evaluation of Outcomes: Adequate for Discharge   LCSW Treatment Plan for Primary Diagnosis: Schizoaffective disorder, bipolar type ([place]) Long Term Goal(s): Safe transition to appropriate next level of care at discharge, Engage patient in therapeutic group  addressing interpersonal concerns.  Short Term Goals: Engage patient in aftercare planning with referrals and resources  Therapeutic Interventions: Assess for all discharge needs, 1 to 1 time with Social worker, Explore available resources and support systems, Assess for adequacy in community support network, Educate family and significant other(s) on suicide prevention, Complete Psychosocial Assessment, Interpersonal group therapy.  Evaluation of Outcomes: Met  Unable to engage patient in any meaningful conversation 12/21:  Remains the same 01/09/15:  Plans to travel to [place] to stay with friend Synthia Innocent, follow up [place] in Treatment: Attending groups:  No Participating in groups: No Taking medication as prescribed: Yes Toleration medication: Yes, no side effects reported at this time Family/Significant other contact made: No Patient understands diagnosis: No  Limited insight Discussing patient identified problems/goals with staff: Yes Medical problems stabilized or resolved: Yes Denies suicidal/homicidal ideation: Yes Issues/concerns per patient self-inventory: None Other: N/A  New problem(s) identified: None identified at this time.   New Short Term/Long Term Goal(s): None identified at this time.   Discharge Plan or Barriers: Unknown  Reason for Continuation of Hospitalization:     Estimated Length of Stay: 3-5 days  Attendees: Patient: 01/09/2016  8:43 AM  Physician: Neita Garnet, MD  01/09/2016  8:43 AM  Nursing: Jeanie Cooks, RN 01/09/2016  8:43 AM  RN Care Manager: Lars Pinks, RN 01/09/2016  8:43 AM  Social Worker: [name], [place] 01/09/2016  8:43 AM  Recreational Therapist: Junita Kubota  01/09/2016  8:43 AM  Other: Norberto Sorenson 01/09/2016  8:43 AM  Other:  01/09/2016  8:43 AM    Scribe for Treatment Team:  [name] MSW, LCSW 01/09/2016

## 2016-01-09 NOTE — BHH Suicide Risk Assessment (Signed)
[name] [name] INPATIENT:  Family/Significant Other Suicide Prevention Education  Suicide Prevention Education:  Education Completed; No one has been identified by the patient as the family member/significant other with whom the patient will be residing, and identified as the person(s) who will aid the patient in the event of a mental health crisis (suicidal ideations/suicide attempt).  With written consent from the patient, the family member/significant other has been provided the following suicide prevention education, prior to the and/or following the discharge of the patient.  The suicide prevention education provided includes the following:  Suicide risk factors  Suicide prevention and interventions  National Suicide Hotline telephone number  [facility] assessment telephone number  [facility] Emergency Assistance [place] and/or Residential Mobile Crisis Unit telephone number  Request made of family/significant other to:  Remove weapons (e.g., guns, rifles, knives), all items previously/currently identified as safety concern.    Remove drugs/medications (over-the-counter, prescriptions, illicit drugs), all items previously/currently identified as a safety concern.  The family member/significant other verbalizes understanding of the suicide prevention education information provided.  The family member/significant other agrees to remove the items of safety concern listed above. The patient did not endorse SI at the time of admission, nor did the patient c/o SI during the stay here.  SPE not required. However, I did talk to Ms [name] [name] [phone], to go over crises plan. [place] 01/09/2016, 11:23 AM

## 2016-01-09 NOTE — Progress Notes (Signed)
Patient discharged to lobby. Patient was stable and appreciative at that time. All papers, samples and prescriptions were given and valuables returned. Verbal understanding expressed. Denies SI/HI and A/VH. Patient given opportunity to express concerns and ask questions.  

## 2016-01-10 NOTE — Congregational Nurse Program (Signed)
Congregational Nurse Program Note  Date of Encounter: 12/20/2015  Past Medical History: Past Medical History:  Diagnosis Date  . Bipolar affective disorder, currently manic, mild ([place])   . Diabetes mellitus without complication ([place])   . Schizophrenia [facility])     Encounter Details:     CNP Questionnaire - 12/20/15 1156      Patient Demographics   Is this a new or existing patient? Existing   Patient is considered a/an Not Applicable   Race African-American/Black     Patient Assistance   Location of Patient Assistance [facility]   Patient's financial/insurance status Medicaid   Uninsured Patient ([place] [facility]) No   Patient referred to apply for the following financial assistance Not Applicable   Food insecurities addressed Not Applicable   Transportation assistance No   Assistance securing medications No   Educational Naval architect health;Safety     Encounter Details   Primary purpose of visit Acute Illness/Condition Visit;Chronic Illness/Condition Visit  needs transportation   Was an Emergency Department visit averted? No   Does patient have a medical provider? Yes   Patient referred to Not Applicable;Emergency Department   Was a mental health screening completed? (GAINS tool) No   Does patient have dental issues? No   Does patient have vision issues? No   Does your patient have an abnormal blood pressure today? No   Since previous encounter, have you referred patient for abnormal blood pressure that resulted in a new diagnosis or medication change? No   Does your patient have an abnormal blood glucose today? No   Since previous encounter, have you referred patient for abnormal blood glucose that resulted in a new diagnosis or medication change? No   Was there a life-saving intervention made? No     TC from friend, [name], who says he is trying to get her help for mental illness.  Reports she has been out of her medication for 4  months. She is just "out of it"  Meaning just not doing anything, not taking care of self.  Recommended he take her to [facility] or ER.

## 2016-01-12 ENCOUNTER — Encounter (HOSPITAL_COMMUNITY): Payer: Self-pay | Admitting: Emergency Medicine

## 2016-01-12 ENCOUNTER — Emergency Department (HOSPITAL_COMMUNITY)
Admission: EM | Admit: 2016-01-12 | Discharge: 2016-01-13 | Disposition: A | Discharge status: Home / Self Care | Payer: Medicare Other | Attending: Emergency Medicine | Admitting: Emergency Medicine

## 2016-01-12 DIAGNOSIS — Z7984 Long term (current) use of oral hypoglycemic drugs: Secondary | ICD-10-CM | POA: Diagnosis not present

## 2016-01-12 DIAGNOSIS — F250 Schizoaffective disorder, bipolar type: Secondary | ICD-10-CM | POA: Diagnosis not present

## 2016-01-12 DIAGNOSIS — F39 Unspecified mood [affective] disorder: Secondary | ICD-10-CM

## 2016-01-12 DIAGNOSIS — E119 Type 2 diabetes mellitus without complications: Secondary | ICD-10-CM | POA: Insufficient documentation

## 2016-01-12 DIAGNOSIS — Z813 Family history of other psychoactive substance abuse and dependence: Secondary | ICD-10-CM | POA: Diagnosis not present

## 2016-01-12 DIAGNOSIS — F411 Generalized anxiety disorder: Secondary | ICD-10-CM | POA: Diagnosis not present

## 2016-01-12 DIAGNOSIS — Z79899 Other long term (current) drug therapy: Secondary | ICD-10-CM | POA: Insufficient documentation

## 2016-01-12 DIAGNOSIS — G4700 Insomnia, unspecified: Secondary | ICD-10-CM | POA: Diagnosis not present

## 2016-01-12 LAB — COMPREHENSIVE METABOLIC PANEL
ALK PHOS: 46 U/L (ref 38–126)
ALT: 23 U/L (ref 14–54)
ANION GAP: 9 (ref 5–15)
AST: 22 U/L (ref 15–41)
Albumin: 4.4 g/dL (ref 3.5–5.0)
BILIRUBIN TOTAL: 0.4 mg/dL (ref 0.3–1.2)
BUN: 15 mg/dL (ref 6–20)
CALCIUM: 9.2 mg/dL (ref 8.9–10.3)
CO2: 25 mmol/L (ref 22–32)
Chloride: 106 mmol/L (ref 101–111)
Creatinine, Ser: 0.69 mg/dL (ref 0.44–1.00)
GFR calc Af Amer: >60 mL/min (ref >60)
GLUCOSE: 103 mg/dL — AB (ref 65–99)
POTASSIUM: 3.6 mmol/L (ref 3.5–5.1)
Sodium: 140 mmol/L (ref 135–145)
TOTAL PROTEIN: 7.9 g/dL (ref 6.5–8.1)

## 2016-01-12 LAB — CBC WITH DIFFERENTIAL/PLATELET
BASOS ABS: 0 10*3/uL (ref 0.0–0.1)
BASOS PCT: 0 %
EOS ABS: 0 10*3/uL (ref 0.0–0.7)
Eosinophils Relative: 0 %
HEMATOCRIT: 34.1 % — AB (ref 36.0–46.0)
Hemoglobin: 11.6 g/dL — ABNORMAL LOW (ref 12.0–15.0)
Lymphocytes Relative: 38 %
Lymphs Abs: 1.8 10*3/uL (ref 0.7–4.0)
MCH: 31.4 pg (ref 26.0–34.0)
MCHC: 34 g/dL (ref 30.0–36.0)
MCV: 92.4 fL (ref 78.0–100.0)
MONOS PCT: 10 %
Monocytes Absolute: 0.5 10*3/uL (ref 0.1–1.0)
NEUTROS ABS: 2.4 10*3/uL (ref 1.7–7.7)
Neutrophils Relative %: 52 %
PLATELETS: 228 10*3/uL (ref 150–400)
RBC: 3.69 MIL/uL — ABNORMAL LOW (ref 3.87–5.11)
RDW: 13.2 % (ref 11.5–15.5)
WBC: 4.7 10*3/uL (ref 4.0–10.5)

## 2016-01-12 LAB — ETHANOL

## 2016-01-12 LAB — SALICYLATE LEVEL

## 2016-01-12 LAB — ACETAMINOPHEN LEVEL

## 2016-01-12 LAB — CBG MONITORING, ED: GLUCOSE-CAPILLARY: 86 mg/dL (ref 65–99)

## 2016-01-12 MED ORDER — OXYBUTYNIN CHLORIDE ER 10 MG PO TB24
10.0000 mg | ORAL_TABLET | Freq: Every day | ORAL | Status: DC
  Administered 2016-01-12: 10 mg via ORAL
  Filled 2016-01-12: qty 1

## 2016-01-12 MED ORDER — HYDROXYZINE HCL 25 MG PO TABS: 25.0000 mg | ORAL_TABLET | Freq: Three times a day (TID) | ORAL | Status: DC | PRN

## 2016-01-12 MED ORDER — ARIPIPRAZOLE 5 MG PO TABS: 25.0000 mg | ORAL_TABLET | Freq: Every day | ORAL | Status: DC

## 2016-01-12 MED ORDER — TEMAZEPAM 15 MG PO CAPS
15.0000 mg | ORAL_CAPSULE | Freq: Every day | ORAL | Status: DC
  Administered 2016-01-12: 15 mg via ORAL
  Filled 2016-01-12: qty 1

## 2016-01-12 MED ORDER — METFORMIN HCL 500 MG PO TABS
500.0000 mg | ORAL_TABLET | Freq: Every day | ORAL | Status: DC
  Administered 2016-01-13: 500 mg via ORAL
  Filled 2016-01-12: qty 1

## 2016-01-12 NOTE — ED Notes (Signed)
Patient continues to repeat she does not want to go back to pastor's house.  "I need help."  "I just need to get out of there."  Patient also reported she thinks there is some wiring in her back to destroy her.  She is also having many paranoid thoughts about people trying to harm her.

## 2016-01-12 NOTE — ED Notes (Signed)
Pt provided with cup for urin. Will continue to assess.

## 2016-01-12 NOTE — ED Notes (Signed)
SBAR Report received from previous nurse. Pt received calm and visible on unit. Pt denies current SI/ HI, A/V H, depression, anxiety, or pain at this time, and appears otherwise stable and free of distress. Pt reminded of camera surveillance, q 15 min rounds, and rules of the milieu. Will continue to assess. 
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
## 2016-01-12 NOTE — ED Notes (Signed)
Patient says she was sent by her pastor to be evaluated for continued psychiatric concerns. She says her pastor says that she does not manage her medications properly. During a chart review it is discovered that her pastor has IVC'd her in the past and appears to be in some type of landlord role based on patient conversation.

## 2016-01-12 NOTE — ED Triage Notes (Signed)
Patient brought in by her pastor for paranoid thoughts.  Patient denies suicidal or homicidal ideation.  Patient was treated here recently for similar symptoms.  When asked if she was taking her medications patient replied yes but could not remember if she had taken them today.

## 2016-01-12 NOTE — ED Provider Notes (Signed)
[place] DEPT Provider Note   CSN: [id number] Arrival date & time: 01/12/16  1607     History   Chief Complaint Chief Complaint  Patient presents with  . Medical Clearance    HPI [name] [name] is a 36 y.o. female.  The history is provided by the patient. No language interpreter was used.   [name] [name] is a 36 y.o. female who presents to the [facility] Department complaining of medical clearance.  Level V caveat due to psychiatric disorder.  Patient states that she was brought in by her pastor for unknown reasons. She states that she just wants to go away. She wants to be taken to jail. She states that when she sees faces she sees them get scarred up immediately. She denies any SI for HI. She denies any hallucinations. Past Medical History:  Diagnosis Date  . Bipolar affective disorder, currently manic, mild ([place])   . Diabetes mellitus without complication ([place])   . Schizophrenia [facility])     Patient Active Problem List   Diagnosis Date Noted  . Insomnia   . Anxiety state   . Overactive bladder   . Diabetes mellitus ([place]) 02/08/2015  . Schizoaffective disorder, bipolar type ([place]) 01/28/2015  . Non compliance w medication regimen     History reviewed. No pertinent surgical history.  OB History    No data available       Home Medications    Prior to Admission medications   Medication Sig Start Date End Date Taking? Authorizing Provider  ARIPiprazole (ABILIFY) 5 MG tablet Take 5 tablets (25 mg total) by mouth daily with supper. 01/09/16  Yes Kerrie Buffalo, NP  ARIPiprazole ER 400 MG SRER Inject 400 mg into the muscle every 28 (twenty-eight) days. Next dose due 1/28 02/03/16  Yes Kerrie Buffalo, NP  hydrOXYzine (ATARAX/VISTARIL) 25 MG tablet Take 1 tablet (25 mg total) by mouth 3 (three) times daily as needed for anxiety. 01/09/16  Yes Kerrie Buffalo, NP  metFORMIN (GLUCOPHAGE) 500 MG tablet Take 1 tablet (500 mg total) by mouth daily with breakfast.  01/10/16  Yes Kerrie Buffalo, NP  oxybutynin (DITROPAN-XL) 10 MG 24 hr tablet Take 1 tablet (10 mg total) by mouth at bedtime. 01/09/16  Yes Kerrie Buffalo, NP  temazepam (RESTORIL) 15 MG capsule Take 1 capsule (15 mg total) by mouth at bedtime. 01/09/16  Yes Kerrie Buffalo, NP    Family History Family History  Problem Relation Age of Onset  . Drug abuse Maternal Uncle     Social History Social History  Substance Use Topics  . Smoking status: Never Smoker  . Smokeless tobacco: Never Used  . Alcohol use No     Allergies   Patient has no known allergies.   Review of Systems Review of Systems  All other systems reviewed and are negative.    Physical Exam Updated Vital Signs BP 126/79 (BP Location: Right Arm)   Pulse 63   Temp 97.8 F (36.6 C) (Oral)   Resp 20   Ht 5\' 4"  (1.626 m)   LMP  (LMP Unknown)   SpO2 100%   Physical Exam  Constitutional: She is oriented to person, place, and time. She appears well-developed and well-nourished.  HENT:  Head: Normocephalic and atraumatic.  Cardiovascular: Normal rate and regular rhythm.   Pulmonary/Chest: Effort normal. No respiratory distress.  Musculoskeletal: Normal range of motion.  Neurological: She is alert and oriented to person, place, and time.  Skin: Skin is warm.  Psychiatric:  Poor eye contact.  Tearful. Appears to be responding to internal stimuli. Paranoid.  Nursing note and vitals reviewed.    ED Treatments / Results  Labs (all labs ordered are listed, but only abnormal results are displayed) Labs Reviewed  COMPREHENSIVE METABOLIC PANEL - Abnormal; Notable for the following:       Result Value   Glucose, Bld 103 (*)    All other components within normal limits  CBC WITH DIFFERENTIAL/PLATELET - Abnormal; Notable for the following:    RBC 3.69 (*)    Hemoglobin 11.6 (*)    HCT 34.1 (*)    All other components within normal limits  ACETAMINOPHEN LEVEL - Abnormal; Notable for the following:    Acetaminophen  (Tylenol), Serum <10 (*)    All other components within normal limits  ETHANOL  SALICYLATE LEVEL  RAPID URINE DRUG SCREEN, HOSP PERFORMED  CBG MONITORING, ED  POC URINE PREG, ED    EKG  EKG Interpretation None       Radiology No results found.  Procedures Procedures (including critical care time)  Medications Ordered in ED Medications  ARIPiprazole (ABILIFY) tablet 25 mg (not administered)  hydrOXYzine (ATARAX/VISTARIL) tablet 25 mg (not administered)  metFORMIN (GLUCOPHAGE) tablet 500 mg (not administered)  oxybutynin (DITROPAN-XL) 24 hr tablet 10 mg (10 mg Oral Given 01/12/16 2159)  temazepam (RESTORIL) capsule 15 mg (15 mg Oral Given 01/12/16 2159)     Initial Impression / Assessment and Plan / ED Course  I have reviewed the triage vital signs and the nursing notes.  Pertinent labs & imaging results that were available during my care of the patient were reviewed by me and considered in my medical decision making (see chart for details).  Clinical Course     Patient here for psychiatric evaluation. She is paranoid on evaluation but denies any SI or HI. She has been medically cleared for psychiatric evaluation and treatment.  Final Clinical Impressions(s) / ED Diagnoses   Final diagnoses:  None    New Prescriptions New Prescriptions   No medications on file     Quintella Reichert, MD 01/13/16 [phone]

## 2016-01-13 ENCOUNTER — Encounter (HOSPITAL_COMMUNITY): Payer: Self-pay | Admitting: Registered Nurse

## 2016-01-13 DIAGNOSIS — F411 Generalized anxiety disorder: Secondary | ICD-10-CM

## 2016-01-13 DIAGNOSIS — F250 Schizoaffective disorder, bipolar type: Secondary | ICD-10-CM | POA: Diagnosis not present

## 2016-01-13 DIAGNOSIS — Z79899 Other long term (current) drug therapy: Secondary | ICD-10-CM | POA: Diagnosis not present

## 2016-01-13 DIAGNOSIS — G4700 Insomnia, unspecified: Secondary | ICD-10-CM

## 2016-01-13 DIAGNOSIS — Z813 Family history of other psychoactive substance abuse and dependence: Secondary | ICD-10-CM

## 2016-01-13 LAB — RAPID URINE DRUG SCREEN, HOSP PERFORMED
AMPHETAMINES: NOT DETECTED
BARBITURATES: NOT DETECTED
Benzodiazepines: POSITIVE — AB
Cocaine: NOT DETECTED
Opiates: NOT DETECTED
TETRAHYDROCANNABINOL: NOT DETECTED

## 2016-01-13 NOTE — Consult Note (Signed)
[place] Psychiatry Consult   Reason for Consult:  Manic behavior Referring Physician:  EDP Patient Identification: [name] [name] MRN:  [id number] Principal Diagnosis: Schizoaffective disorder, bipolar type [facility]) Diagnosis:   Patient Active Problem List   Diagnosis Date Noted  . Insomnia [G47.00]   . Anxiety state [F41.1]   . Overactive bladder [N32.81]   . Diabetes mellitus ([place]) [E11.9] 02/08/2015  . Schizoaffective disorder, bipolar type ([place]) [F25.0] 01/28/2015  . Non compliance w medication regimen [Z91.14]     Total Time spent with patient: 45 minutes  Subjective:   [name] [name] is a 36 y.o. female patient presented to [facility] related to wanting to be evaluated for psychiatric concerns.  HPI:  [name] [name] 36 y.o. female patient seen by Dr. Darleene Cleaver and this provider.  Chart reviewed 01/13/16.   On evaluation:  [name] [name] reports that she is having some issues with her medication.  States that she is not sure how she should be taking her medications.  Wanted to know if she should continue the Abilify and that Vistaril made her have hallucinations when she took it at night but worked well when she took it during the day.  Patient states that she has set up to go to [facility] in [place] but needs to move it closer to [place] because she cant get to [place].  Informed patient that she could go to [facility] in [place] and that she could do a walking 8am-3pm and instructed that she would need to go first thing in the morning; they could also assist with medication; and help her set up Medicaid transport.  At this time patient denies suicidal/homicidal ideation, psychosis, and paranoia    Past Psychiatric History: Schizoaffective Disorder  Risk to Self: Suicidal Ideation: No Suicidal Intent: No Is patient at risk for suicide?: No Suicidal Plan?: No Access to Means: No What has been your use of drugs/alcohol within the last 12 months?:  denies How many times?: 1 Triggers for Past Attempts: Unpredictable Intentional Self Injurious Behavior: None Risk to Others: Homicidal Ideation: No Thoughts of Harm to Others: No Current Homicidal Intent: No Current Homicidal Plan: No Access to Homicidal Means: No History of harm to others?: No Assessment of Violence: None Noted Does patient have access to weapons?: No Criminal Charges Pending?: No Does patient have a court date: No Prior Inpatient Therapy: Prior Inpatient Therapy: Yes Prior Therapy Dates: Multiple  Prior Therapy Facilty/Provider(s): [facility] Reason for Treatment: schizophrenia Prior Outpatient Therapy: Prior Outpatient Therapy: No Does patient have an ACCT team?: No Does patient have Intensive In-House Services?  : No Does patient have [facility] services? : No (in the past ) Does patient have P4[facility] services?: No  Past Medical History:  Past Medical History:  Diagnosis Date  . Bipolar affective disorder, currently manic, mild ([place])   . Diabetes mellitus without complication ([place])   . Schizophrenia ([place])    History reviewed. No pertinent surgical history. Family History:  Family History  Problem Relation Age of Onset  . Drug abuse Maternal Uncle    Family Psychiatric  History: Denies Social History:  History  Alcohol Use No     History  Drug Use No    Social History   Social History  . Marital status: Legally Separated    Spouse name: N/A  . Number of children: N/A  . Years of education: N/A   Social History Main Topics  . Smoking status: Never Smoker  . Smokeless tobacco: Never Used  . Alcohol use No  .  Drug use: No  . Sexual activity: Not Currently   Other Topics Concern  . None   Social History Narrative  . None   Additional Social History:    Allergies:  No Known Allergies  Labs:  Results for orders placed or performed during the hospital encounter of 01/12/16 (from the past 48 hour(s))  Comprehensive metabolic panel     Status:  Abnormal   Collection Time: 01/12/16  5:03 PM  Result Value Ref Range   Sodium 140 135 - 145 mmol/L   Potassium 3.6 3.5 - 5.1 mmol/L   Chloride 106 101 - 111 mmol/L   CO2 25 22 - 32 mmol/L   Glucose, Bld 103 (H) 65 - 99 mg/dL   BUN 15 6 - 20 mg/dL   Creatinine, Ser 0.69 0.44 - 1.00 mg/dL   Calcium 9.2 8.9 - 10.3 mg/dL   Total Protein 7.9 6.5 - 8.1 g/dL   Albumin 4.4 3.5 - 5.0 g/dL   AST 22 15 - 41 U/L   ALT 23 14 - 54 U/L   Alkaline Phosphatase 46 38 - 126 U/L   Total Bilirubin 0.4 0.3 - 1.2 mg/dL   GFR calc non Af Amer >60 >60 mL/min   GFR calc Af Amer >60 >60 mL/min    Comment: (NOTE) The eGFR has been calculated using the CKD EPI equation. This calculation has not been validated in all clinical situations. eGFR's persistently <60 mL/min signify possible Chronic Kidney Disease.    Anion gap 9 5 - 15  Ethanol     Status: None   Collection Time: 01/12/16  5:03 PM  Result Value Ref Range   Alcohol, Ethyl (B) <5 <5 mg/dL    Comment:        LOWEST DETECTABLE LIMIT FOR SERUM ALCOHOL IS 5 mg/dL FOR MEDICAL PURPOSES ONLY   CBC with Diff     Status: Abnormal   Collection Time: 01/12/16  5:03 PM  Result Value Ref Range   WBC 4.7 4.0 - 10.5 K/uL   RBC 3.69 (L) 3.87 - 5.11 MIL/uL   Hemoglobin 11.6 (L) 12.0 - 15.0 g/dL   HCT 34.1 (L) 36.0 - 46.0 %   MCV 92.4 78.0 - 100.0 fL   MCH 31.4 26.0 - 34.0 pg   MCHC 34.0 30.0 - 36.0 g/dL   RDW 13.2 11.5 - 15.5 %   Platelets 228 150 - 400 K/uL   Neutrophils Relative % 52 %   Neutro Abs 2.4 1.7 - 7.7 K/uL   Lymphocytes Relative 38 %   Lymphs Abs 1.8 0.7 - 4.0 K/uL   Monocytes Relative 10 %   Monocytes Absolute 0.5 0.1 - 1.0 K/uL   Eosinophils Relative 0 %   Eosinophils Absolute 0.0 0.0 - 0.7 K/uL   Basophils Relative 0 %   Basophils Absolute 0.0 0.0 - 0.1 K/uL  Acetaminophen level     Status: Abnormal   Collection Time: 01/12/16  5:03 PM  Result Value Ref Range   Acetaminophen (Tylenol), Serum <10 (L) 10 - 30 ug/mL    Comment:         THERAPEUTIC CONCENTRATIONS VARY SIGNIFICANTLY. A RANGE OF 10-30 ug/mL MAY BE AN EFFECTIVE CONCENTRATION FOR MANY PATIENTS. HOWEVER, SOME ARE BEST TREATED AT CONCENTRATIONS OUTSIDE THIS RANGE. ACETAMINOPHEN CONCENTRATIONS >150 ug/mL AT 4 HOURS AFTER INGESTION AND >50 ug/mL AT 12 HOURS AFTER INGESTION ARE OFTEN ASSOCIATED WITH TOXIC REACTIONS.   Salicylate level     Status: None   Collection Time: 01/12/16  5:03 PM  Result Value Ref Range   Salicylate Lvl <6.8 2.8 - 30.0 mg/dL  POC CBG, ED     Status: None   Collection Time: 01/12/16  9:00 PM  Result Value Ref Range   Glucose-Capillary 86 65 - 99 mg/dL  Urine rapid drug screen (hosp performed)not at [facility]     Status: Abnormal   Collection Time: 01/13/16  5:01 AM  Result Value Ref Range   Opiates NONE DETECTED NONE DETECTED   Cocaine NONE DETECTED NONE DETECTED   Benzodiazepines POSITIVE (A) NONE DETECTED   Amphetamines NONE DETECTED NONE DETECTED   Tetrahydrocannabinol NONE DETECTED NONE DETECTED   Barbiturates NONE DETECTED NONE DETECTED    Comment:        DRUG SCREEN FOR MEDICAL PURPOSES ONLY.  IF CONFIRMATION IS NEEDED FOR ANY PURPOSE, NOTIFY LAB WITHIN 5 DAYS.        LOWEST DETECTABLE LIMITS FOR URINE DRUG SCREEN Drug Class       Cutoff (ng/mL) Amphetamine      1000 Barbiturate      200 Benzodiazepine   127 Tricyclics       517 Opiates          300 Cocaine          300 THC              50     Current Facility-Administered Medications  Medication Dose Route Frequency Provider Last Rate Last Dose  . ARIPiprazole (ABILIFY) tablet 25 mg  25 mg Oral Q supper Quintella Reichert, MD      . hydrOXYzine (ATARAX/VISTARIL) tablet 25 mg  25 mg Oral TID PRN Quintella Reichert, MD      . metFORMIN (GLUCOPHAGE) tablet 500 mg  500 mg Oral Q breakfast Quintella Reichert, MD   500 mg at 01/13/16 0813  . oxybutynin (DITROPAN-XL) 24 hr tablet 10 mg  10 mg Oral QHS Quintella Reichert, MD   10 mg at 01/12/16 2159  . temazepam (RESTORIL)  capsule 15 mg  15 mg Oral QHS Quintella Reichert, MD   15 mg at 01/12/16 2159   Current Outpatient Prescriptions  Medication Sig Dispense Refill  . ARIPiprazole (ABILIFY) 5 MG tablet Take 5 tablets (25 mg total) by mouth daily with supper. 150 tablet 0  . [START ON 02/03/2016] ARIPiprazole ER 400 MG SRER Inject 400 mg into the muscle every 28 (twenty-eight) days. Next dose due 1/28 1 each 0  . hydrOXYzine (ATARAX/VISTARIL) 25 MG tablet Take 1 tablet (25 mg total) by mouth 3 (three) times daily as needed for anxiety. 30 tablet 0  . metFORMIN (GLUCOPHAGE) 500 MG tablet Take 1 tablet (500 mg total) by mouth daily with breakfast. 30 tablet 0  . oxybutynin (DITROPAN-XL) 10 MG 24 hr tablet Take 1 tablet (10 mg total) by mouth at bedtime. 30 tablet 0  . temazepam (RESTORIL) 15 MG capsule Take 1 capsule (15 mg total) by mouth at bedtime. 30 capsule 0    Musculoskeletal: Strength & Muscle Tone: within normal limits Gait & Station: normal Patient leans: N/A  Psychiatric Specialty Exam: Physical Exam  Nursing note and vitals reviewed. Constitutional: She is oriented to person, place, and time.  Neck: Normal range of motion.  Respiratory: Effort normal.  Musculoskeletal: Normal range of motion.  Neurological: She is alert and oriented to person, place, and time.    Review of Systems  Constitutional: Negative.   HENT: Negative.   Eyes: Negative.   Respiratory: Negative.   Cardiovascular: Negative.  Gastrointestinal: Negative.   Musculoskeletal: Negative.   Skin: Negative.   Neurological: Negative.   Endo/Heme/Allergies: Negative.   Psychiatric/Behavioral: Positive for depression (Stable). Negative for substance abuse and suicidal ideas. The patient is nervous/anxious. The patient does not have insomnia (Stable).     Blood pressure 109/60, pulse 79, temperature 98.9 F (37.2 C), temperature source Oral, resp. rate 18, height '5\' 4"'  (1.626 m), SpO2 100 %.There is no height or weight on file to  calculate BMI.  General Appearance: Fairly Groomed  Eye Contact:  Fair  Speech:  Clear and Coherent and Normal Rate  Volume:  Normal  Mood:  Depressed  Affect:  Appropriate  Thought Process:  Goal Directed  Orientation:  Full (Time, Place, and Person)  Thought Content:  Denies hallucinations, delusions, and paranoia  Suicidal Thoughts:  No  Homicidal Thoughts:  No  Memory:  Immediate;   Good Recent;   Good Remote;   Fair  Judgement:  Fair  Insight:  Fair  Psychomotor Activity:  Normal  Concentration:  Concentration: Fair and Attention Span: Fair  Recall:  Good  Fund of Knowledge:  Fair  Language:  Good  Akathisia:  No  Handed:  Right  AIMS (if indicated):     Assets:  Communication Skills Desire for Improvement Housing Social Support  ADL's:  Intact  Cognition:  WNL  Sleep:        Treatment Plan Summary: Plan Discharge home  Disposition: No evidence of imminent risk to self or others at present.   Patient does not meet criteria for psychiatric inpatient admission. Discharge home follow up with [facility] for medication management  Earleen Newport, NP 01/13/2016 3:54 PM  Patient seen face-to-face for psychiatric evaluation, chart reviewed and case discussed with the physician extender and developed treatment plan. Reviewed the information documented and agree with the treatment plan. Corena Pilgrim, MD

## 2016-01-13 NOTE — ED Notes (Signed)
Pt verbalized readiness for discharge and was in no acute distress when she signed for discharge and belongings. Pt was escorted to lobby, accompanied by MHT and pastor. Pt verbalized understanding that she was to go to [facility] tomorrow at 830.

## 2016-01-13 NOTE — Progress Notes (Signed)
CSW spoke with patient at bedside. CSW informed patient that psychiatrist recommends that patient follow up with [facility] to receive outpatient services. Patient reported that she was familiar with [facility]. CSW provided patient with handout about [facility]. CSW inquired if patient had any questions. Patient asked CSW about the medications she has taken while here, CSW agreed to inform patient's RN about patient's request. CSW asked patient about barriers that would prevent patient from following up with [facility], patient reported none. CSW informed patient's RN that patient requested to know medications taken while here.

## 2016-01-13 NOTE — BHH Suicide Risk Assessment (Cosign Needed)
Suicide Risk Assessment  Discharge Assessment   [facility] Discharge Suicide Risk Assessment   Principal Problem: Schizoaffective disorder, bipolar type [facility]) Discharge Diagnoses:  Patient Active Problem List   Diagnosis Date Noted  . Insomnia [G47.00]   . Anxiety state [F41.1]   . Overactive bladder [N32.81]   . Diabetes mellitus ([place]) [E11.9] 02/08/2015  . Schizoaffective disorder, bipolar type ([place]) [F25.0] 01/28/2015  . Non compliance w medication regimen [Z91.14]     Total Time spent with patient: 30 minutes  Musculoskeletal: Strength & Muscle Tone: within normal limits Gait & Station: normal Patient leans: N/A  Psychiatric Specialty Exam: Psychiatric Specialty Exam: Physical Exam  Nursing note and vitals reviewed. Constitutional: She is oriented to person, place, and time.  Neck: Normal range of motion.  Respiratory: Effort normal.  Musculoskeletal: Normal range of motion.  Neurological: She is alert and oriented to person, place, and time.    Review of Systems  Constitutional: Negative.   HENT: Negative.   Eyes: Negative.   Respiratory: Negative.   Cardiovascular: Negative.   Gastrointestinal: Negative.   Musculoskeletal: Negative.   Skin: Negative.   Neurological: Negative.   Endo/Heme/Allergies: Negative.   Psychiatric/Behavioral: Positive for depression (Stable). Negative for substance abuse and suicidal ideas. The patient is nervous/anxious. The patient does not have insomnia (Stable).     Blood pressure 109/60, pulse 79, temperature 98.9 F (37.2 C), temperature source Oral, resp. rate 18, height 5\' 4"  (1.626 m), SpO2 100 %.There is no height or weight on file to calculate BMI.  General Appearance: Fairly Groomed  Eye Contact:  Fair  Speech:  Clear and Coherent and Normal Rate  Volume:  Normal  Mood:  Depressed  Affect:  Appropriate  Thought Process:  Goal Directed  Orientation:  Full (Time, Place, and Person)  Thought Content:  Denies hallucinations,  delusions, and paranoia  Suicidal Thoughts:  No  Homicidal Thoughts:  No  Memory:  Immediate;   Good Recent;   Good Remote;   Fair  Judgement:  Fair  Insight:  Fair  Psychomotor Activity:  Normal  Concentration:  Concentration: Fair and Attention Span: Fair  Recall:  Good  Fund of Knowledge:  Fair  Language:  Good  Akathisia:  No  Handed:  Right  AIMS (if indicated):     Assets:  Communication Skills Desire for Improvement Housing Social Support  ADL's:  Intact  Cognition:  WNL  Sleep:       Mental Status Per Nursing Assessment::   On Admission:     Demographic Factors:  Black female  Loss Factors: None  Historical Factors: Impulsivity  Risk Reduction Factors:   Living with another person, especially a relative and Positive social support  Continued Clinical Symptoms:  Previous Psychiatric Diagnoses and Treatments  Cognitive Features That Contribute To Risk:  None    Suicide Risk:  Minimal: No identifiable suicidal ideation.  Patients presenting with no risk factors but with morbid ruminations; may be classified as minimal risk based on the severity of the depressive symptoms  Plan Of Care/Follow-up recommendations:  Activity:  As tolerated Diet:  As tolerated   Follow-up Information    [facility]. Go on 01/14/2016.   Specialty:  [facility] information: [place] [id number] [phone]            Earleen Newport, NP 01/13/2016, 4:12 PM

## 2016-01-13 NOTE — ED Notes (Signed)
Pt is flat, calm, and soft-spoken. She avoids eye contact. She denies SI, HI, and AVH. She took a.m. Metformin without issue. She requested something for pain but said she's OK now, but sometimes has back pain after lying down too long. Breakfast tray and toiletries given. Urged her to approach staff with all needs and concerns. Will continue to monitor for needs/safety.

## 2016-01-13 NOTE — BHH Counselor (Signed)
Pt has been faxed to the following inpt hospitals for possible admission: , [place], [place], [place], [place], [facility], [place], [place], [place], [place], [place], [place], [place], [place], [place].  Lind Covert, MSW, Latanya Presser

## 2016-02-28 ENCOUNTER — Emergency Department (HOSPITAL_COMMUNITY)
Admission: EM | Admit: 2016-02-28 | Discharge: 2016-02-29 | Disposition: A | Discharge status: Home / Self Care | Payer: Medicare Other | Attending: Emergency Medicine | Admitting: Emergency Medicine

## 2016-02-28 ENCOUNTER — Encounter (HOSPITAL_COMMUNITY): Payer: Self-pay | Admitting: *Deleted

## 2016-02-28 DIAGNOSIS — F22 Delusional disorders: Secondary | ICD-10-CM | POA: Diagnosis present

## 2016-02-28 DIAGNOSIS — Z7984 Long term (current) use of oral hypoglycemic drugs: Secondary | ICD-10-CM | POA: Diagnosis not present

## 2016-02-28 DIAGNOSIS — Z79899 Other long term (current) drug therapy: Secondary | ICD-10-CM | POA: Insufficient documentation

## 2016-02-28 DIAGNOSIS — E119 Type 2 diabetes mellitus without complications: Secondary | ICD-10-CM | POA: Diagnosis not present

## 2016-02-28 DIAGNOSIS — F250 Schizoaffective disorder, bipolar type: Secondary | ICD-10-CM | POA: Diagnosis not present

## 2016-02-28 LAB — COMPREHENSIVE METABOLIC PANEL
ALT: 10 U/L — ABNORMAL LOW (ref 14–54)
ANION GAP: 7 (ref 5–15)
AST: 16 U/L (ref 15–41)
Albumin: 4.3 g/dL (ref 3.5–5.0)
Alkaline Phosphatase: 34 U/L — ABNORMAL LOW (ref 38–126)
BILIRUBIN TOTAL: 0.7 mg/dL (ref 0.3–1.2)
BUN: 9 mg/dL (ref 6–20)
CHLORIDE: 106 mmol/L (ref 101–111)
CO2: 27 mmol/L (ref 22–32)
Calcium: 9.5 mg/dL (ref 8.9–10.3)
Creatinine, Ser: 0.81 mg/dL (ref 0.44–1.00)
Glucose, Bld: 99 mg/dL (ref 65–99)
POTASSIUM: 3.3 mmol/L — AB (ref 3.5–5.1)
Sodium: 140 mmol/L (ref 135–145)
TOTAL PROTEIN: 7.7 g/dL (ref 6.5–8.1)

## 2016-02-28 LAB — CBC WITH DIFFERENTIAL/PLATELET
Basophils Absolute: 0 10*3/uL (ref 0.0–0.1)
Basophils Relative: 0 %
EOS PCT: 0 %
Eosinophils Absolute: 0 10*3/uL (ref 0.0–0.7)
HCT: 33.1 % — ABNORMAL LOW (ref 36.0–46.0)
HEMOGLOBIN: 11.5 g/dL — AB (ref 12.0–15.0)
LYMPHS ABS: 2.1 10*3/uL (ref 0.7–4.0)
LYMPHS PCT: 44 %
MCH: 32.2 pg (ref 26.0–34.0)
MCHC: 34.7 g/dL (ref 30.0–36.0)
MCV: 92.7 fL (ref 78.0–100.0)
Monocytes Absolute: 0.3 10*3/uL (ref 0.1–1.0)
Monocytes Relative: 7 %
NEUTROS PCT: 49 %
Neutro Abs: 2.3 10*3/uL (ref 1.7–7.7)
PLATELETS: 208 10*3/uL (ref 150–400)
RBC: 3.57 MIL/uL — AB (ref 3.87–5.11)
RDW: 13.2 % (ref 11.5–15.5)
WBC: 4.7 10*3/uL (ref 4.0–10.5)
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
## 2016-02-28 NOTE — ED Notes (Signed)
Bed: WA28 Expected date:  Expected time:  Means of arrival:  Comments: [employer] 
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
## 2016-02-29 LAB — ETHANOL

## 2016-02-29 LAB — SALICYLATE LEVEL: Salicylate Lvl: <7 mg/dL (ref 2.8–30.0)

## 2016-02-29 LAB — POC URINE PREG, ED: PREG TEST UR: NEGATIVE

## 2016-02-29 LAB — ACETAMINOPHEN LEVEL

## 2016-02-29 MED ORDER — LORAZEPAM 1 MG PO TABS: 1.0000 mg | ORAL_TABLET | Freq: Three times a day (TID) | ORAL | Status: DC | PRN

## 2016-02-29 MED ORDER — ALUM & MAG HYDROXIDE-SIMETH 200-200-20 MG/5ML PO SUSP: 30.0000 mL | ORAL | Status: DC | PRN

## 2016-02-29 MED ORDER — ZOLPIDEM TARTRATE 5 MG PO TABS: 5.0000 mg | ORAL_TABLET | Freq: Every evening | ORAL | Status: DC | PRN

## 2016-02-29 MED ORDER — ACETAMINOPHEN 325 MG PO TABS: 650.0000 mg | ORAL_TABLET | ORAL | Status: DC | PRN

## 2016-02-29 MED ORDER — ONDANSETRON HCL 4 MG PO TABS: 4.0000 mg | ORAL_TABLET | Freq: Three times a day (TID) | ORAL | Status: DC | PRN

## 2016-02-29 MED ORDER — IBUPROFEN 200 MG PO TABS: 600.0000 mg | ORAL_TABLET | Freq: Three times a day (TID) | ORAL | Status: DC | PRN

## 2016-02-29 NOTE — BHH Suicide Risk Assessment (Signed)
Suicide Risk Assessment  Discharge Assessment   [name]   Principal Problem: Schizoaffective disorder, bipolar type [name]) Discharge Diagnoses:  Patient Active Problem List   Diagnosis Date Noted  . Schizoaffective disorder, bipolar type ([place]) [F25.0] 01/28/2015    Priority: High  . Insomnia [G47.00]   . Anxiety state [F41.1]   . Overactive bladder [N32.81]   . Diabetes mellitus ([place]) [E11.9] 02/08/2015  . Non compliance w medication regimen [Z91.14]     Total Time spent with patient: 45 minutes  Musculoskeletal: Strength & Muscle Tone: within normal limits Gait & Station: normal Patient leans: N/A  Psychiatric Specialty Exam:   Blood pressure 123/73, pulse 87, temperature 98.6 F (37 C), temperature source Oral, resp. rate 20, height 5\' 4"  (1.626 m), weight 68 kg (150 lb), SpO2 100 %.Body mass index is 25.75 kg/m.  General Appearance: Casual  Eye Contact::  Good  Speech:  Normal Rate409  Volume:  Normal  Mood:  Irritable  Affect:  Congruent  Thought Process:  Coherent and Descriptions of Associations: Intact  Orientation:  Full (Time, Place, and Person)  Thought Content:  WDL and Logical  Suicidal Thoughts:  No  Homicidal Thoughts:  No  Memory:  Immediate;   Good Recent;   Good Remote;   Good  Judgement:  Fair  Insight:  Fair  Psychomotor Activity:  Normal  Concentration:  Good  Recall:  Good  Fund of Knowledge:Fair  Language: Good  Akathisia:  No  Handed:  Right  AIMS (if indicated):     Assets:  Housing Leisure Time Physical Health Resilience Social Support  Sleep:     Cognition: WNL  ADL's:  Intact   Mental Status Per Nursing Assessment::   On Admission:   homicidal towards her pastor, denies today.  She wants to move as she is living with him, agreeable to homeless resources.  [name] has been living with him for the past year and when she gets upset with him, she comes here with the same presentation.  Resources  provided, stable for discharge.  Demographic Factors:  NA  Loss Factors: NA  Historical Factors: NA  Risk Reduction Factors:   Sense of responsibility to family, Living with another person, especially a relative, Positive social support and Positive therapeutic relationship  Continued Clinical Symptoms:  Irritable   Cognitive Features That Contribute To Risk:  None    Suicide Risk:  Minimal: No identifiable suicidal ideation.  Patients presenting with no risk factors but with morbid ruminations; may be classified as minimal risk based on the severity of the depressive symptoms    Plan Of Care/Follow-up recommendations:  Activity:  as tolerated Diet:  heart healthy diet  [name] Heino, NP 02/29/2016, 10:32 AM

## 2016-02-29 NOTE — Discharge Instructions (Addendum)
For your ongoing mental health needs, you are advised to follow up with [facility].  New and returning patients are seen at their walk-in clinic.  Walk-in hours are Monday - Friday from 8:00 am - 3:00 pm.  Walk-in patients are seen on a first come, first served basis.  Try to arrive as early as possible for he best chance of being seen the same day:       [facility]      [address], [place] [address]      [phone]  For your shelter needs, contact the following service providers:       [facility]      [address], [place] [address]      [phone]       [facility]      [address]      [place], [place] [address]      [phone]  For day shelter and other supportive services for the homeless, contact the [facility]):       [facility]      [place], [place] [address]      [phone]  For transitional housing, contact one of the following agencies.  They provide longer term housing than a shelter, but there is an application process:       [facility] [employer] [facility] [place]. [address], [place] [address]      [phone]
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
## 2016-02-29 NOTE — ED Notes (Signed)
Psych Provider at bedside. 

## 2016-02-29 NOTE — BH Assessment (Signed)
[place] Assessment Progress Note  Per Corena Pilgrim, MD, this pt does not require psychiatric hospitalization at this time.  Pt is to be discharged from [facility] with referral information for [facility], and with information for supportive services for the homeless.  These have been included in pt's discharge instructions.  Pt's nurse has been notified.  Jalene Mullet, [place] Triage Specialist [phone]

## 2016-03-17 IMAGING — CR DG CHEST 2V
2 series · 2 of 2 positions shown · non-contrast
Comparison: None.

CLINICAL DATA: Mid chest pain today with altered mental status.
History of diabetes and bipolar disorder.

EXAM:
CHEST  2 VIEW

[w chest pa]
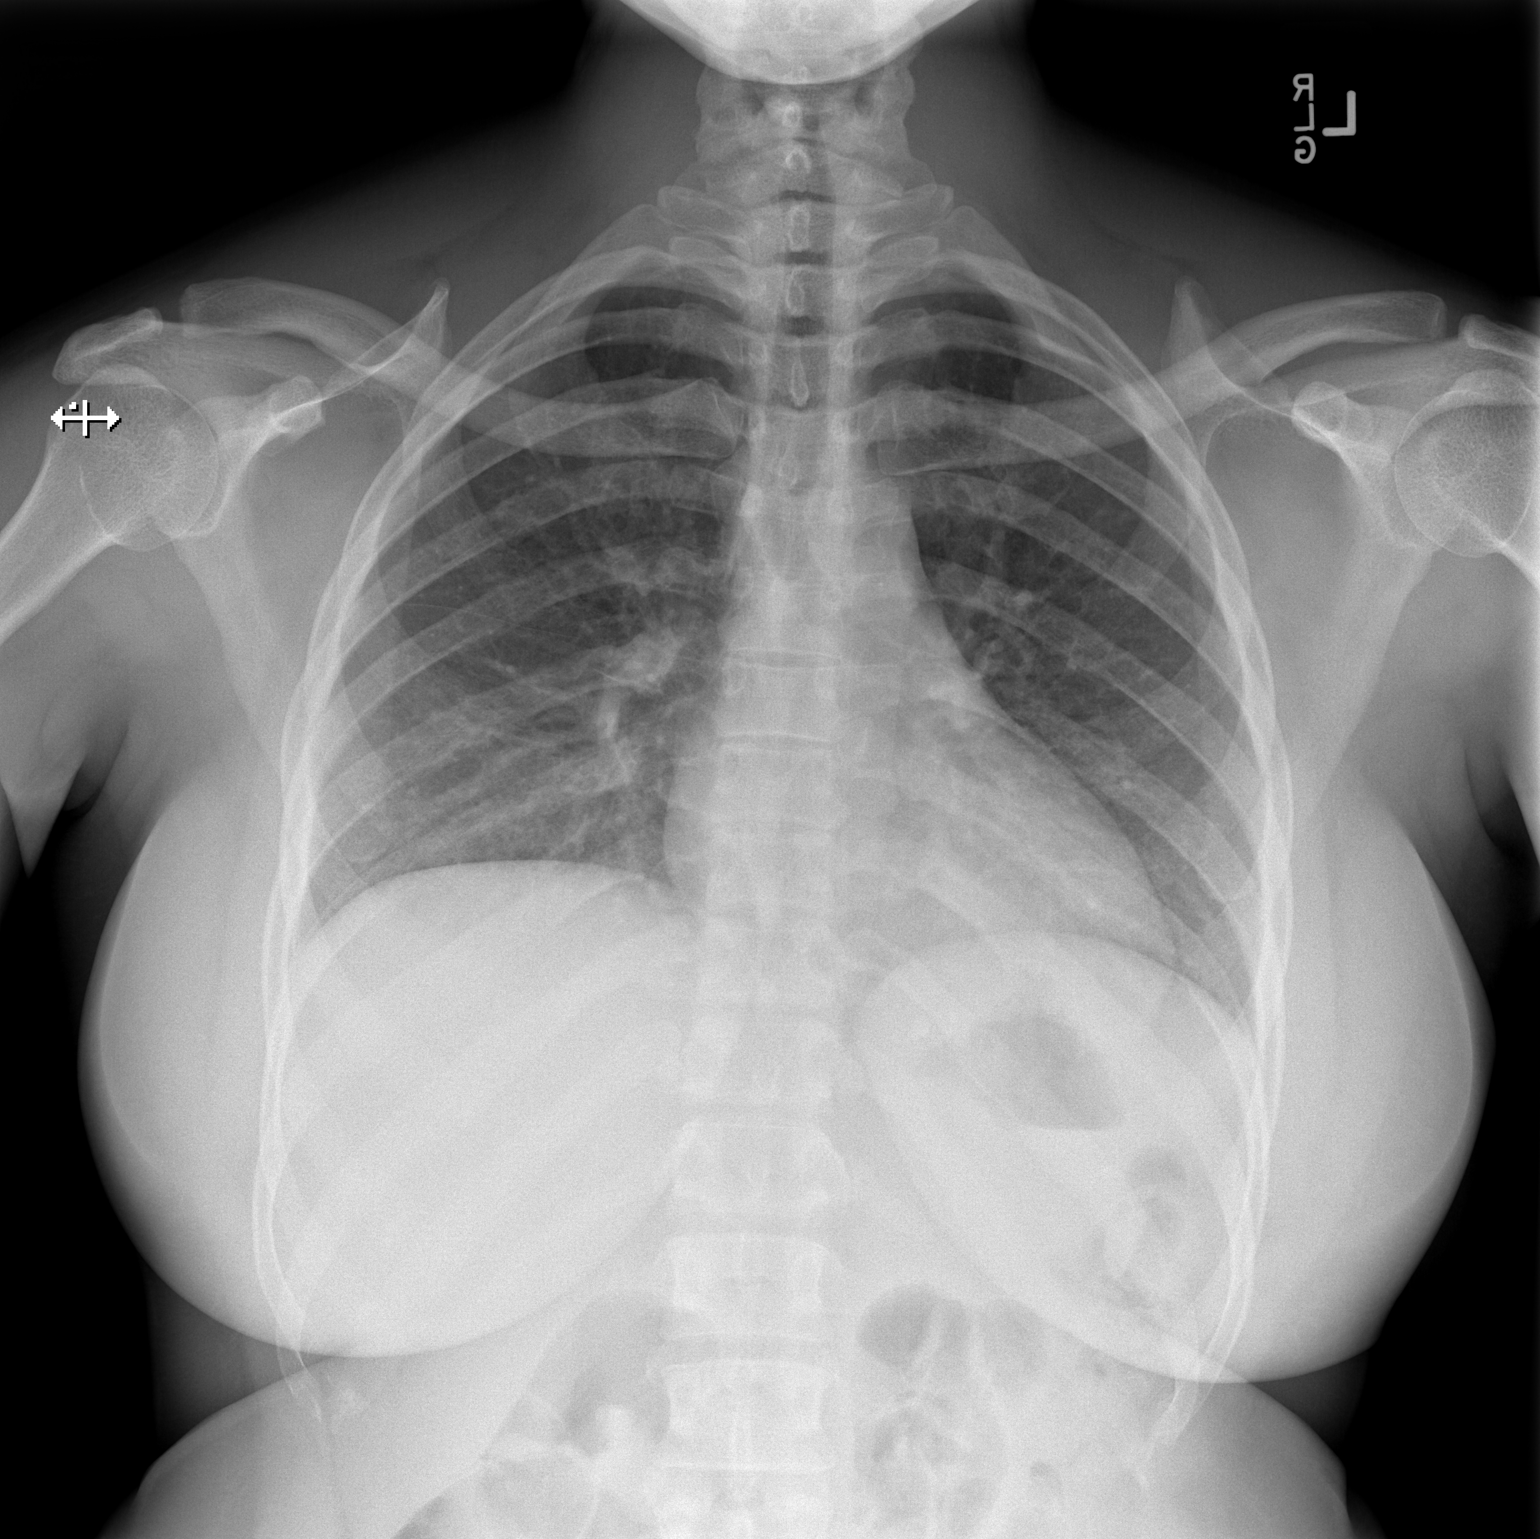

[w chest lat]
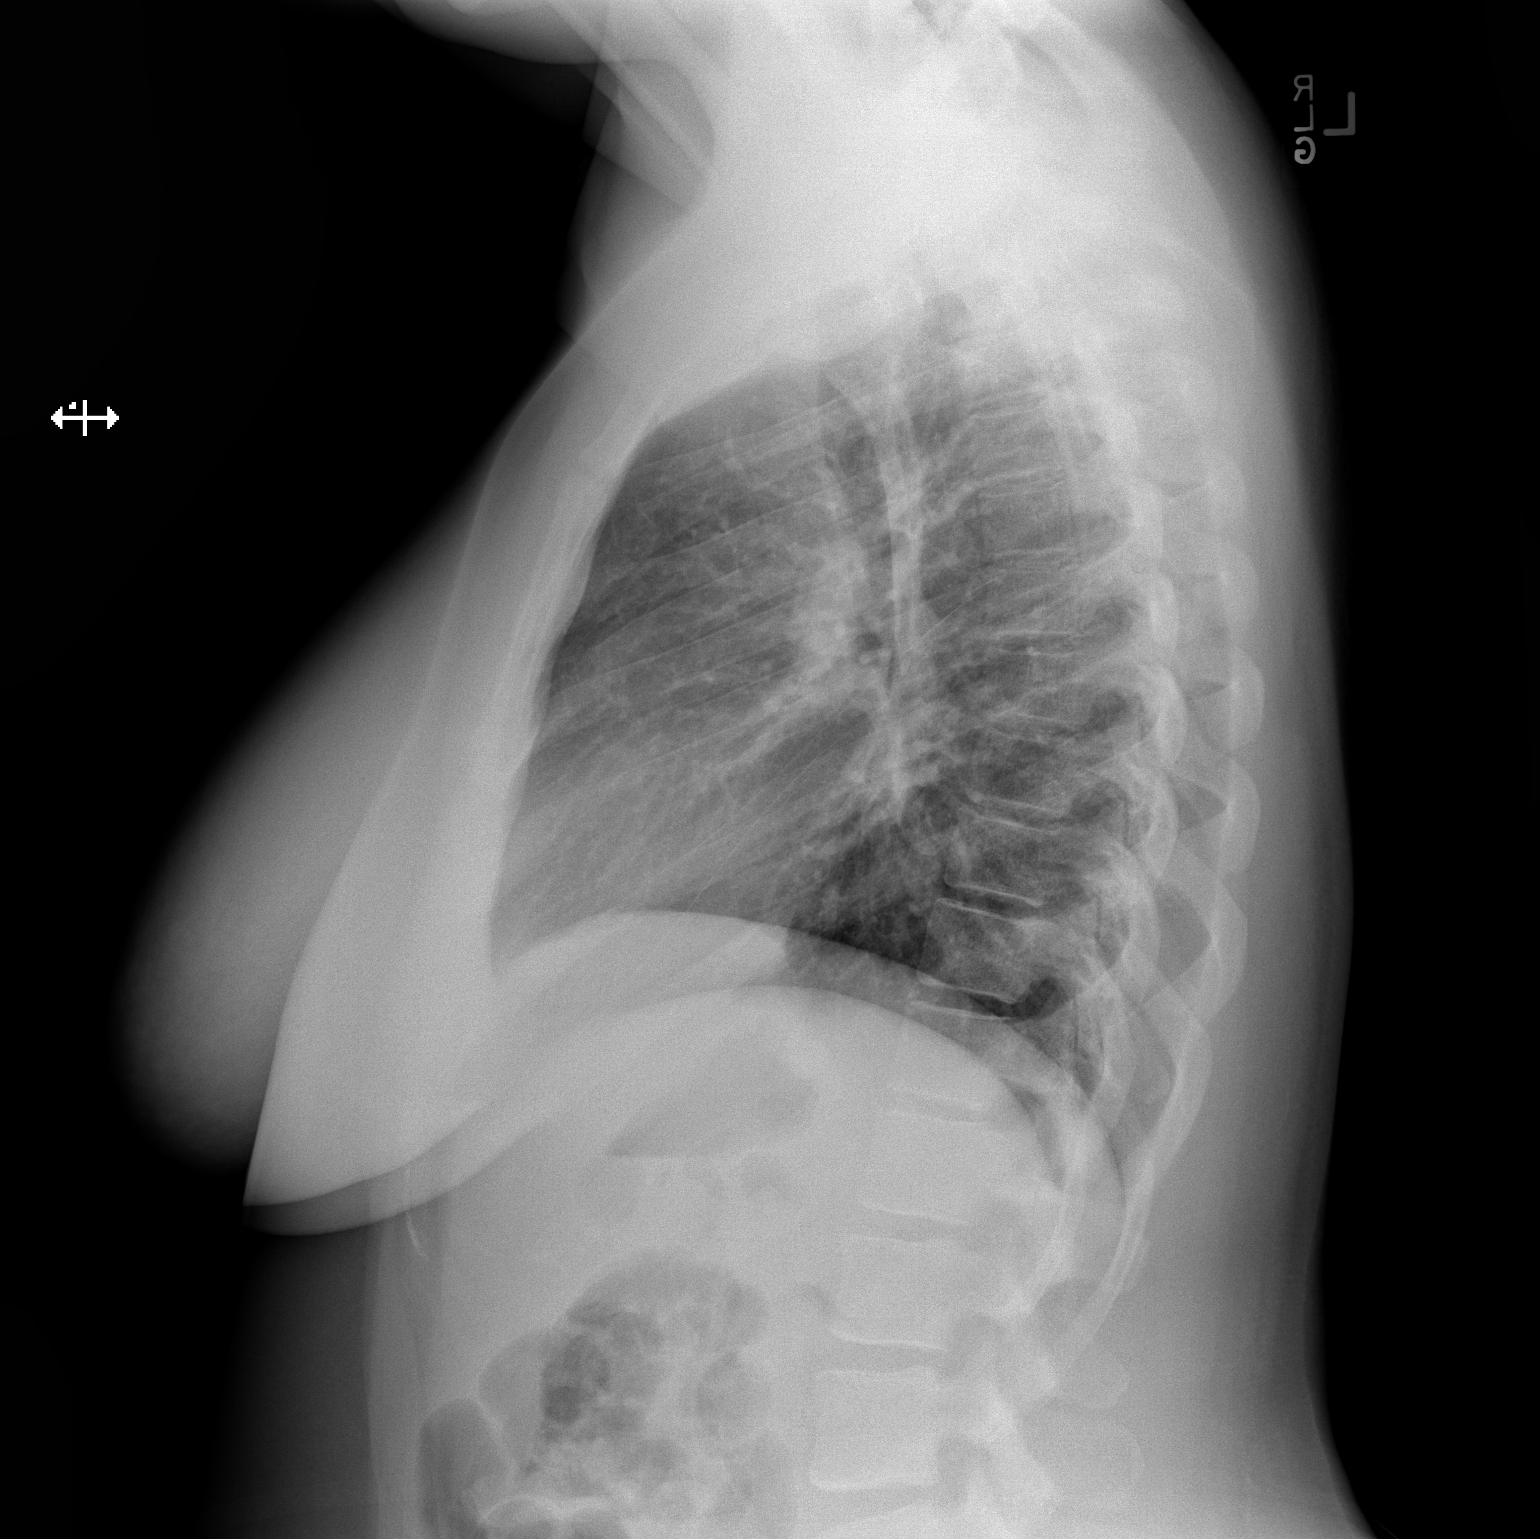

[2 of 2 positions shown; findings below may reference images not displayed]

FINDINGS: Suboptimal inspiration, especially on the frontal examination, with
resulting mild bibasilar atelectasis. The heart size and mediastinal
contours are normal. There is probable mild underlying central
airway thickening, but no airspace disease, edema or pleural
effusion. The bones appear normal.
IMPRESSION: No suspected acute findings. Mild central airway thickening and
bibasilar atelectasis.

## 2016-06-05 ENCOUNTER — Emergency Department (HOSPITAL_COMMUNITY)
Admission: EM | Admit: 2016-06-05 | Discharge: 2016-06-05 | Disposition: A | Discharge status: Home / Self Care | Payer: Medicare Other | Attending: Emergency Medicine | Admitting: Emergency Medicine

## 2016-06-05 ENCOUNTER — Encounter (HOSPITAL_COMMUNITY): Payer: Self-pay

## 2016-06-05 DIAGNOSIS — Z8659 Personal history of other mental and behavioral disorders: Secondary | ICD-10-CM

## 2016-06-05 DIAGNOSIS — Z7984 Long term (current) use of oral hypoglycemic drugs: Secondary | ICD-10-CM | POA: Diagnosis not present

## 2016-06-05 DIAGNOSIS — E119 Type 2 diabetes mellitus without complications: Secondary | ICD-10-CM | POA: Insufficient documentation

## 2016-06-05 DIAGNOSIS — R5383 Other fatigue: Secondary | ICD-10-CM

## 2016-06-05 DIAGNOSIS — Z79899 Other long term (current) drug therapy: Secondary | ICD-10-CM | POA: Insufficient documentation

## 2016-06-05 DIAGNOSIS — Z3202 Encounter for pregnancy test, result negative: Secondary | ICD-10-CM

## 2016-06-05 LAB — PREGNANCY, URINE: Preg Test, Ur: NEGATIVE

## 2016-06-05 NOTE — ED Notes (Signed)
Bed: [facility] Expected date:  Expected time:  Means of arrival:  Comments: 

## 2016-06-05 NOTE — ED Triage Notes (Addendum)
Patient states, "I need a physical and I want my blood tested for iron and see why I am so sleepy." I am also here for a pregnancy test."  Patient asked if she can be seen for"medical clearance". Writer asked patient if she knew what that was and patient answered yes. Patient then stated that she did not need her medicine any more and stated that was part of her past.

## 2016-06-05 NOTE — ED Notes (Signed)
Patient came back to the ED lobby. Patient was given discharge instructions.

## 2016-06-05 NOTE — ED Notes (Signed)
Patient was found in the lobby on the phone stating that she needed to call someone for support. Patient asked to come back to her room in triage and stated, "she would when she got ready." patient left the ED

## 2016-06-05 NOTE — ED Provider Notes (Addendum)
[facility] DEPT Provider Note   CSN: [id number] Arrival date & time: 06/05/16  0727     History   Chief Complaint Chief Complaint  Patient presents with  . wants pregnancy test  . Medical Clearance    HPI [name] [name] is a 36 y.o. female.  Patient with hx anxiety, schizoaffective disorder, c/o feeling tired/sleepy in the past couple weeks. States she is sleeping fine at night, but that still feels tired during the day. Pt is difficult historian. Is eating and drinking normally, normal appetite. Hx anemia, but states not currently on any meds. Also states she wants a pregnancy test. Denies birth control use, and is unsure when last period was. No abd or pelvic pain. No vaginal discharge or bleeding.    The history is provided by the patient.    Past Medical History:  Diagnosis Date  . Bipolar affective disorder, currently manic, mild ([place])   . Diabetes mellitus without complication ([place])   . Schizophrenia [facility])     Patient Active Problem List   Diagnosis Date Noted  . Insomnia   . Anxiety state   . Overactive bladder   . Diabetes mellitus ([place]) 02/08/2015  . Schizoaffective disorder, bipolar type ([place]) 01/28/2015  . Non compliance w medication regimen     Past Surgical History:  Procedure Laterality Date  . WISDOM TOOTH EXTRACTION      OB History    No data available       Home Medications    Prior to Admission medications   Medication Sig Start Date End Date Taking? Authorizing Provider  ARIPiprazole (ABILIFY) 5 MG tablet Take 5 tablets (25 mg total) by mouth daily with supper. Patient not taking: Reported on 02/28/2016 01/09/16   Kerrie Buffalo, NP  ARIPiprazole ER 400 MG SRER Inject 400 mg into the muscle every 28 (twenty-eight) days. Next dose due 1/28 Patient not taking: Reported on 02/28/2016 02/03/16   Kerrie Buffalo, NP  hydrOXYzine (ATARAX/VISTARIL) 25 MG tablet Take 1 tablet (25 mg total) by mouth 3 (three) times daily as needed for  anxiety. Patient not taking: Reported on 02/28/2016 01/09/16   Kerrie Buffalo, NP  metFORMIN (GLUCOPHAGE) 500 MG tablet Take 1 tablet (500 mg total) by mouth daily with breakfast. Patient not taking: Reported on 02/28/2016 01/10/16   Kerrie Buffalo, NP  oxybutynin (DITROPAN-XL) 10 MG 24 hr tablet Take 1 tablet (10 mg total) by mouth at bedtime. Patient not taking: Reported on 02/28/2016 01/09/16   Kerrie Buffalo, NP  temazepam (RESTORIL) 15 MG capsule Take 1 capsule (15 mg total) by mouth at bedtime. Patient not taking: Reported on 02/28/2016 01/09/16   Kerrie Buffalo, NP    Family History Family History  Problem Relation Age of Onset  . Drug abuse Maternal Uncle     Social History Social History  Substance Use Topics  . Smoking status: Never Smoker  . Smokeless tobacco: Never Used  . Alcohol use No     Allergies   Patient has no known allergies.   Review of Systems Review of Systems  Constitutional: Negative for fever.  HENT: Negative for sore throat.   Eyes: Negative for redness.  Respiratory: Negative for cough and shortness of breath.   Cardiovascular: Negative for chest pain.  Gastrointestinal: Negative for abdominal pain.  Genitourinary: Negative for flank pain, vaginal bleeding and vaginal discharge.  Musculoskeletal: Negative for back pain.  Skin: Negative for rash.  Neurological: Negative for headaches.  Hematological: Does not bruise/bleed easily.  Psychiatric/Behavioral: The patient is  nervous/anxious.      Physical Exam Updated Vital Signs BP 113/79 (BP Location: Left Arm)   Pulse 99   Temp 98.1 F (36.7 C) (Oral)   Resp 16   Ht 1.626 m (5\' 4" )   Wt 71.8 kg (158 lb 4 oz)   SpO2 100%   BMI 27.16 kg/m   Physical Exam  Constitutional: She appears well-developed and well-nourished. No distress.  HENT:  Head: Atraumatic.  Right Ear: External ear normal.  Left Ear: External ear normal.  Mouth/Throat: Oropharynx is clear and moist.  Eyes: Conjunctivae  are normal. Pupils are equal, round, and reactive to light. No scleral icterus.  Neck: Neck supple. No tracheal deviation present. No thyromegaly present.  Cardiovascular: Normal rate, regular rhythm, normal heart sounds and intact distal pulses.   Pulmonary/Chest: Effort normal and breath sounds normal. No respiratory distress.  Abdominal: Soft. Normal appearance. She exhibits no distension. There is no tenderness.  Musculoskeletal: She exhibits no edema.  Neurological: She is alert.  Speech clear/fluent. Ambulates w steady gait.   Skin: Skin is warm and dry. No rash noted. She is not diaphoretic.  Psychiatric:  Mildly anxious appearing.  Pt does not appear to be responding to internal stimuli - no delusions or hallucinations. Denies feeling depressed or unusually stressed.   Nursing note and vitals reviewed.    ED Treatments / Results  Labs (all labs ordered are listed, but only abnormal results are displayed) Results for orders placed or performed during the hospital encounter of 06/05/16  Pregnancy, urine  Result Value Ref Range   Preg Test, Ur NEGATIVE NEGATIVE    EKG  EKG Interpretation None       Radiology No results found.  Procedures Procedures (including critical care time)  Medications Ordered in ED Medications - No data to display   Initial Impression / Assessment and Plan / ED Course  I have reviewed the triage vital signs and the nursing notes.  Pertinent labs & imaging results that were available during my care of the patient were reviewed by me and considered in my medical decision making (see chart for details).  Pt requests pregnancy test  - sent.  Reviewed nursing notes and prior charts for additional history.   I feel patient would also benefit from close outpatient f/u with both primary care physician as well as behavior health resources.  Pt advised for mental health care, to go directly to [facility].   For primary care, is referred to Health  and [facility].     Final Clinical Impressions(s) / ED Diagnoses   Final diagnoses:  None    New Prescriptions New Prescriptions   No medications on file       Lajean Saver, MD 06/05/16 [phone]

## 2016-06-05 NOTE — Discharge Instructions (Signed)
It was our pleasure to provide your ER care today - we hope that you feel better.  Your pregnancy test is negative.   Get adequate rest.  You may try over-the-counter sleep aid as need if trouble getting to sleep at night.   Eat balanced diet.  For medical concerns, follow up with primary care doctor in the coming week - call office to arrange appointment.  For mental health issues and/or concerns, go directly to [facility] - follow up at [facility] in the next 1-2 days.   Return to ER if worse, new symptoms, or other medical emergency.

## 2016-06-05 NOTE — ED Triage Notes (Signed)
Per EMS- Patient states she keeps falling asleep and God keeps waking her up and she is afraid she will fall asleep and never wake back up. Patient also stated that she wanted a pregnancy test.

## 2016-06-05 NOTE — ED Notes (Signed)
Bed: WA01 Expected date: 06/05/16 Expected time: 6:45 AM Means of arrival:  Comments:

## 2016-06-08 ENCOUNTER — Emergency Department (HOSPITAL_COMMUNITY)
Admission: EM | Admit: 2016-06-08 | Discharge: 2016-06-08 | Discharge status: Against Medical Advice | Payer: Medicare Other | Attending: Emergency Medicine | Admitting: Emergency Medicine

## 2016-06-08 ENCOUNTER — Encounter (HOSPITAL_COMMUNITY): Payer: Self-pay | Admitting: Oncology

## 2016-06-08 DIAGNOSIS — Z5321 Procedure and treatment not carried out due to patient leaving prior to being seen by health care provider: Secondary | ICD-10-CM | POA: Insufficient documentation

## 2016-06-08 DIAGNOSIS — E860 Dehydration: Secondary | ICD-10-CM | POA: Diagnosis not present

## 2016-06-08 DIAGNOSIS — R5383 Other fatigue: Secondary | ICD-10-CM | POA: Diagnosis not present

## 2016-06-08 NOTE — ED Provider Notes (Signed)
Signed up to evaluate patient and as I was walking to her room she was walking out. She apparently told RN that she felt "she was not going to get the help she needs here". I offered to speak with her, however she still wished to leave. She understands she can return here if she changes her mind.   Larene Pickett, PA-C 06/08/16 4514    Daleen Bo, MD 06/08/16 [phone]

## 2016-06-08 NOTE — ED Triage Notes (Signed)
Pt bib [facility].  Per [facility] pt has non specific c/o.  Pt was hard to redirect.  Per pt she presented to the hospital d/t fatigue and dehydration however cannot elaborate.  Pt denies pain at this time.

## 2016-10-21 ENCOUNTER — Emergency Department (HOSPITAL_COMMUNITY)
Admission: EM | Admit: 2016-10-21 | Discharge: 2016-10-23 | Disposition: A | Discharge status: Other Acute Inpatient Hospital | Payer: Medicare Other | Attending: Emergency Medicine | Admitting: Emergency Medicine

## 2016-10-21 ENCOUNTER — Encounter (HOSPITAL_COMMUNITY): Payer: Self-pay | Admitting: *Deleted

## 2016-10-21 DIAGNOSIS — Z9114 Patient's other noncompliance with medication regimen: Secondary | ICD-10-CM | POA: Insufficient documentation

## 2016-10-21 DIAGNOSIS — Z046 Encounter for general psychiatric examination, requested by authority: Secondary | ICD-10-CM | POA: Diagnosis not present

## 2016-10-21 DIAGNOSIS — E119 Type 2 diabetes mellitus without complications: Secondary | ICD-10-CM | POA: Insufficient documentation

## 2016-10-21 DIAGNOSIS — R443 Hallucinations, unspecified: Secondary | ICD-10-CM | POA: Insufficient documentation

## 2016-10-21 LAB — PREGNANCY, URINE: Preg Test, Ur: NEGATIVE

## 2016-10-21 LAB — URINALYSIS, ROUTINE W REFLEX MICROSCOPIC
Bilirubin Urine: NEGATIVE
Glucose, UA: NEGATIVE mg/dL
Hgb urine dipstick: NEGATIVE
Ketones, ur: 80 mg/dL — AB
Nitrite: NEGATIVE
Protein, ur: 30 mg/dL — AB
Specific Gravity, Urine: 1.029 (ref 1.005–1.030)
pH: 5 (ref 5.0–8.0)

## 2016-10-21 LAB — CBC WITH DIFFERENTIAL/PLATELET
Basophils Absolute: 0 10*3/uL (ref 0.0–0.1)
Basophils Relative: 0 %
Eosinophils Absolute: 0 10*3/uL (ref 0.0–0.7)
Eosinophils Relative: 0 %
HCT: 33.4 % — ABNORMAL LOW (ref 36.0–46.0)
Hemoglobin: 11.5 g/dL — ABNORMAL LOW (ref 12.0–15.0)
Lymphocytes Relative: 33 %
Lymphs Abs: 2.1 10*3/uL (ref 0.7–4.0)
MCH: 32.9 pg (ref 26.0–34.0)
MCHC: 34.4 g/dL (ref 30.0–36.0)
MCV: 95.4 fL (ref 78.0–100.0)
Monocytes Absolute: 0.7 10*3/uL (ref 0.1–1.0)
Monocytes Relative: 11 %
Neutro Abs: 3.7 10*3/uL (ref 1.7–7.7)
Neutrophils Relative %: 56 %
Platelets: 184 10*3/uL (ref 150–400)
RBC: 3.5 MIL/uL — ABNORMAL LOW (ref 3.87–5.11)
RDW: 12.8 % (ref 11.5–15.5)
WBC: 6.5 10*3/uL (ref 4.0–10.5)

## 2016-10-21 LAB — COMPREHENSIVE METABOLIC PANEL
ALT: 13 U/L — ABNORMAL LOW (ref 14–54)
AST: 19 U/L (ref 15–41)
Albumin: 4.2 g/dL (ref 3.5–5.0)
Alkaline Phosphatase: 41 U/L (ref 38–126)
Anion gap: 12 (ref 5–15)
BUN: 13 mg/dL (ref 6–20)
CO2: 24 mmol/L (ref 22–32)
Calcium: 9.5 mg/dL (ref 8.9–10.3)
Chloride: 106 mmol/L (ref 101–111)
Creatinine, Ser: 0.83 mg/dL (ref 0.44–1.00)
GFR calc Af Amer: >60 mL/min (ref >60)
GFR calc non Af Amer: >60 mL/min (ref >60)
Glucose, Bld: 121 mg/dL — ABNORMAL HIGH (ref 65–99)
Potassium: 3.4 mmol/L — ABNORMAL LOW (ref 3.5–5.1)
Sodium: 142 mmol/L (ref 135–145)
Total Bilirubin: 0.9 mg/dL (ref 0.3–1.2)
Total Protein: 7.9 g/dL (ref 6.5–8.1)

## 2016-10-21 LAB — RAPID URINE DRUG SCREEN, HOSP PERFORMED
Amphetamines: NOT DETECTED
Barbiturates: NOT DETECTED
Benzodiazepines: NOT DETECTED
Cocaine: NOT DETECTED
Opiates: NOT DETECTED
Tetrahydrocannabinol: NOT DETECTED

## 2016-10-21 LAB — ETHANOL: Alcohol, Ethyl (B): <10 mg/dL (ref <10)

## 2016-10-21 MED ORDER — ARIPIPRAZOLE 15 MG PO TABS
25.0000 mg | ORAL_TABLET | Freq: Every day | ORAL | Status: DC
  Administered 2016-10-22: 25 mg via ORAL
  Filled 2016-10-21 (×2): qty 1

## 2016-10-21 MED ORDER — OXYBUTYNIN CHLORIDE ER 5 MG PO TB24
10.0000 mg | ORAL_TABLET | Freq: Every day | ORAL | Status: DC
  Filled 2016-10-21 (×2): qty 1

## 2016-10-21 MED ORDER — TEMAZEPAM 15 MG PO CAPS
15.0000 mg | ORAL_CAPSULE | Freq: Every day | ORAL | Status: DC
  Administered 2016-10-21: 15 mg via ORAL
  Filled 2016-10-21 (×2): qty 1

## 2016-10-21 MED ORDER — METFORMIN HCL 500 MG PO TABS
500.0000 mg | ORAL_TABLET | Freq: Every day | ORAL | Status: DC
  Administered 2016-10-22 – 2016-10-23 (×2): 500 mg via ORAL
  Filled 2016-10-21 (×2): qty 1

## 2016-10-21 MED ORDER — HYDROXYZINE HCL 25 MG PO TABS
25.0000 mg | ORAL_TABLET | Freq: Three times a day (TID) | ORAL | Status: DC | PRN
  Administered 2016-10-21: 25 mg via ORAL
  Filled 2016-10-21 (×2): qty 1

## 2016-10-21 MED ORDER — ONDANSETRON HCL 4 MG PO TABS: 4.0000 mg | ORAL_TABLET | Freq: Three times a day (TID) | ORAL | Status: DC | PRN

## 2016-10-21 MED ORDER — IBUPROFEN 400 MG PO TABS
600.0000 mg | ORAL_TABLET | Freq: Three times a day (TID) | ORAL | Status: DC | PRN
  Administered 2016-10-22: 600 mg via ORAL
  Filled 2016-10-21: qty 2

## 2016-10-21 NOTE — ED Notes (Signed)
Family left after pt left waiting room, reported to registration that pt had been off her medication for 7 days, having hallucinations auditory and visual.

## 2016-10-21 NOTE — ED Triage Notes (Signed)
Pt presents to er with family for psych evaluation, when asked why pt is here pt states " I am in pain and I am being filmed" pt reluctant to go back to treatment room but did agree to go. Dr Wilson Singer notified and at bedside, pt refusing vital signs,

## 2016-10-21 NOTE — ED Provider Notes (Signed)
[facility] EMERGENCY DEPARTMENT Provider Note   CSN: [id number] Arrival date & time: 10/21/16  2053     History   Chief Complaint Chief Complaint  Patient presents with  . V70.1    HPI Kateline Kinkade is a 36 y.o. female.  HPI   36 year old female brought in by family members or psychiatric eval She is a past psychiatric history. Family reports that she has been off her medications for approximately 1 week. Patient is actively responding to internal stimuli. She says that she has "a death sentence against me." She states that she was brought here by a family member who is also a spirit.she states that she intermittently takes her medications recently started remember to take them. She cannot tell me last time she was actually had them. Family left the emergency room just after dropping her off. For that she was on Depakote and Restoril but does not know doses.  Past Medical History:  Diagnosis Date  . Bipolar affective disorder, currently manic, mild ([place])   . Diabetes mellitus without complication ([place])   . Schizophrenia [facility])     Patient Active Problem List   Diagnosis Date Noted  . Insomnia   . Anxiety state   . Overactive bladder   . Diabetes mellitus ([place]) 02/08/2015  . Schizoaffective disorder, bipolar type ([place]) 01/28/2015  . Non compliance w medication regimen     Past Surgical History:  Procedure Laterality Date  . WISDOM TOOTH EXTRACTION      OB History    No data available       Home Medications    Prior to Admission medications   Medication Sig Start Date End Date Taking? Authorizing Provider  ARIPiprazole (ABILIFY) 5 MG tablet Take 5 tablets (25 mg total) by mouth daily with supper. Patient not taking: Reported on 02/28/2016 01/09/16   Kerrie Buffalo, NP  ARIPiprazole ER 400 MG SRER Inject 400 mg into the muscle every 28 (twenty-eight) days. Next dose due 1/28 Patient not taking: Reported on 02/28/2016 02/03/16   Kerrie Buffalo, NP  hydrOXYzine  (ATARAX/VISTARIL) 25 MG tablet Take 1 tablet (25 mg total) by mouth 3 (three) times daily as needed for anxiety. Patient not taking: Reported on 02/28/2016 01/09/16   Kerrie Buffalo, NP  metFORMIN (GLUCOPHAGE) 500 MG tablet Take 1 tablet (500 mg total) by mouth daily with breakfast. Patient not taking: Reported on 02/28/2016 01/10/16   Kerrie Buffalo, NP  oxybutynin (DITROPAN-XL) 10 MG 24 hr tablet Take 1 tablet (10 mg total) by mouth at bedtime. Patient not taking: Reported on 02/28/2016 01/09/16   Kerrie Buffalo, NP  temazepam (RESTORIL) 15 MG capsule Take 1 capsule (15 mg total) by mouth at bedtime. Patient not taking: Reported on 02/28/2016 01/09/16   Kerrie Buffalo, NP    Family History Family History  Problem Relation Age of Onset  . Drug abuse Maternal Uncle     Social History Social History  Substance Use Topics  . Smoking status: Never Smoker  . Smokeless tobacco: Never Used  . Alcohol use No     Allergies   Patient has no known allergies.   Review of Systems Review of Systems  Level V caveat because of psychiatric illness. Physical Exam Updated Vital Signs Wt 71.7 kg (158 lb)   BMI 27.12 kg/m   Physical Exam  Constitutional: She appears well-developed and well-nourished. No distress.  HENT:  Head: Normocephalic and atraumatic.  Eyes: Conjunctivae are normal. Right eye exhibits no discharge. Left eye exhibits no discharge.  Neck: Neck supple.  Cardiovascular: Normal rate, regular rhythm and normal heart sounds.  Exam reveals no gallop and no friction rub.   No murmur heard. Pulmonary/Chest: Effort normal and breath sounds normal. No respiratory distress.  Abdominal: Soft. She exhibits no distension. There is no tenderness.  Musculoskeletal: She exhibits no edema or tenderness.  Neurological: She is alert.  Skin: Skin is warm and dry.  Psychiatric:  Sitting upright on the edge of the bed. Very flat affect. Fair eye contact. Reasonably well dressed and groomed.  Speech is clear and monotone. Appears that she may be thought blocking. Movements are slow and deliberate.seems to lose her train of thought easily.  Nursing note and vitals reviewed.    ED Treatments / Results  Labs (all labs ordered are listed, but only abnormal results are displayed) Labs Reviewed - No data to display  EKG  EKG Interpretation None       Radiology No results found.  Procedures Procedures (including critical care time)  Medications Ordered in ED Medications - No data to display   Initial Impression / Assessment and Plan / ED Course  I have reviewed the triage vital signs and the nursing notes.  Pertinent labs & imaging results that were available during my care of the patient were reviewed by me and considered in my medical decision making (see chart for details).     36 year old female brought in by family for psychiatric evaluation. Psych history and has apparently been off her medications for a week. She does appear to be actively responding to internal stimuli. She has asked at times to leave but is redirectable. At least from my initial assessment, I feel that she does need psychiatric stabilization. She was involuntarily committed. We'll medically clear. Anticipate TTS evaluation.  Not clear as to exact medications or dosing. Past medication list is more than 6 months old. Pharmacy needs to reconcile.  Final Clinical Impressions(s) / ED Diagnoses   Final diagnoses:  Hallucinations    New Prescriptions New Prescriptions   No medications on file     Virgel Manifold, MD 10/21/16 2133
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
## 2016-10-22 DIAGNOSIS — R443 Hallucinations, unspecified: Secondary | ICD-10-CM | POA: Diagnosis not present

## 2016-10-22 LAB — CBG MONITORING, ED: GLUCOSE-CAPILLARY: 104 mg/dL — AB (ref 65–99)

## 2016-10-22 MED ORDER — ARIPIPRAZOLE 10 MG PO TABS
25.0000 mg | ORAL_TABLET | Freq: Every day | ORAL | Status: DC
  Filled 2016-10-22 (×2): qty 3

## 2016-10-22 NOTE — ED Notes (Signed)
Pt came out in the hallway and wanted to speak to someone about why she was here.  Explained to pt that she was waiting on bed placement from behavorial health.  Pt started saying that we were trying to kill her and she was on death row.  Calmly talked to patient while she walked back to her room.

## 2016-10-22 NOTE — ED Notes (Signed)
Lunch tray given. 

## 2016-10-22 NOTE — ED Notes (Signed)
Pt sleepy, did agree with coaxing to change into paper scrubs, sitter remains at bedside, pt has two bags labeled and placed in locker,

## 2016-10-22 NOTE — ED Notes (Signed)
Attempted to given pt vistaril, pt refused medication, pt did agree to have vital signs taken,

## 2016-10-22 NOTE — ED Notes (Signed)
Pt resting with eyes closed, resp even and non labored, sitter remains at bedside,

## 2016-10-22 NOTE — ED Notes (Signed)
Pt updated on plan of care, sitter remains at bedside,

## 2016-10-22 NOTE — ED Notes (Signed)
Given supper tray  

## 2016-10-22 NOTE — ED Notes (Signed)
Pt being re assessed at this time by [facility]

## 2016-10-22 NOTE — Progress Notes (Signed)
Patient meets criteria for inpatient treatment. CSW faxed referrals to the following inpatient facilities for review:  [facility], [facility], [facility], [place], [place], [facility], [facility], [facility], [facility]   TTS will continue to seek bed placement.   Radonna Ricker MSW, [place] Disposition [phone]

## 2016-10-22 NOTE — B[facility] Counselor (Signed)
Re-assessment:   Patient report visual and auditory hallucinations. Patient did not express if the voices was giving commands. Patient was starring and taking long periods to respond. Patient affect and mood flat however patient stated she was feeling happy.   Disposition: Continue inpatient per Verna Czech, NP

## 2016-10-22 NOTE — ED Notes (Signed)
Patient agitated. Refuses medication stating "you are trying to overdose me".

## 2016-10-23 ENCOUNTER — Encounter (HOSPITAL_COMMUNITY): Payer: Self-pay | Admitting: *Deleted

## 2016-10-23 ENCOUNTER — Inpatient Hospital Stay (HOSPITAL_COMMUNITY)
Admission: AD | Admit: 2016-10-23 | Discharge: 2016-10-31 | DRG: 885 | Disposition: A | Discharge status: Home / Self Care | Payer: Medicare Other | Attending: Psychiatry | Admitting: Psychiatry

## 2016-10-23 DIAGNOSIS — Z9114 Patient's other noncompliance with medication regimen: Secondary | ICD-10-CM | POA: Diagnosis not present

## 2016-10-23 DIAGNOSIS — G4700 Insomnia, unspecified: Secondary | ICD-10-CM | POA: Diagnosis present

## 2016-10-23 DIAGNOSIS — R443 Hallucinations, unspecified: Secondary | ICD-10-CM | POA: Diagnosis not present

## 2016-10-23 DIAGNOSIS — E119 Type 2 diabetes mellitus without complications: Secondary | ICD-10-CM | POA: Diagnosis present

## 2016-10-23 DIAGNOSIS — F250 Schizoaffective disorder, bipolar type: Secondary | ICD-10-CM | POA: Diagnosis present

## 2016-10-23 DIAGNOSIS — F411 Generalized anxiety disorder: Secondary | ICD-10-CM | POA: Diagnosis present

## 2016-10-23 DIAGNOSIS — Z7984 Long term (current) use of oral hypoglycemic drugs: Secondary | ICD-10-CM

## 2016-10-23 DIAGNOSIS — N3281 Overactive bladder: Secondary | ICD-10-CM | POA: Diagnosis present

## 2016-10-23 DIAGNOSIS — R450 Nervousness: Secondary | ICD-10-CM | POA: Diagnosis not present

## 2016-10-23 DIAGNOSIS — F419 Anxiety disorder, unspecified: Secondary | ICD-10-CM | POA: Diagnosis not present

## 2016-10-23 DIAGNOSIS — Z813 Family history of other psychoactive substance abuse and dependence: Secondary | ICD-10-CM | POA: Diagnosis not present

## 2016-10-23 DIAGNOSIS — F22 Delusional disorders: Secondary | ICD-10-CM | POA: Diagnosis not present

## 2016-10-23 DIAGNOSIS — Z62810 Personal history of physical and sexual abuse in childhood: Secondary | ICD-10-CM | POA: Diagnosis not present

## 2016-10-23 LAB — GLUCOSE, CAPILLARY
GLUCOSE-CAPILLARY: 86 mg/dL (ref 65–99)
Glucose-Capillary: 111 mg/dL — ABNORMAL HIGH (ref 65–99)

## 2016-10-23 LAB — CBG MONITORING, ED: Glucose-Capillary: 118 mg/dL — ABNORMAL HIGH (ref 65–99)

## 2016-10-23 MED ORDER — OXYBUTYNIN CHLORIDE ER 5 MG PO TB24
10.0000 mg | ORAL_TABLET | Freq: Every day | ORAL | Status: DC
  Administered 2016-10-26 – 2016-10-30 (×5): 10 mg via ORAL
  Filled 2016-10-23 (×10): qty 2
  Filled 2016-10-23: qty 1
  Filled 2016-10-23: qty 2

## 2016-10-23 MED ORDER — ONDANSETRON HCL 4 MG PO TABS
4.0000 mg | ORAL_TABLET | Freq: Three times a day (TID) | ORAL | Status: DC | PRN
Start: 2016-10-23 — End: 2016-10-31

## 2016-10-23 MED ORDER — HALOPERIDOL LACTATE 5 MG/ML IJ SOLN
1.0000 mg | Freq: Once | INTRAMUSCULAR | Status: AC | PRN
  Administered 2016-10-24: 1 mg via INTRAMUSCULAR
  Filled 2016-10-23: qty 1

## 2016-10-23 MED ORDER — TEMAZEPAM 15 MG PO CAPS
15.0000 mg | ORAL_CAPSULE | Freq: Every day | ORAL | Status: DC
  Administered 2016-10-23 – 2016-10-28 (×4): 15 mg via ORAL
  Filled 2016-10-23 (×6): qty 1

## 2016-10-23 MED ORDER — METFORMIN HCL 500 MG PO TABS
500.0000 mg | ORAL_TABLET | Freq: Every day | ORAL | Status: DC
  Administered 2016-10-25 – 2016-10-31 (×4): 500 mg via ORAL
  Filled 2016-10-23 (×10): qty 1

## 2016-10-23 MED ORDER — LORAZEPAM 2 MG/ML IJ SOLN
1.0000 mg | Freq: Once | INTRAMUSCULAR | Status: AC | PRN
  Administered 2016-10-24: 1 mg via INTRAMUSCULAR
  Filled 2016-10-23: qty 1

## 2016-10-23 MED ORDER — ALUM & MAG HYDROXIDE-SIMETH 200-200-20 MG/5ML PO SUSP
30.0000 mL | ORAL | Status: DC | PRN
  Administered 2016-10-27 (×2): 30 mL via ORAL
  Filled 2016-10-23 (×2): qty 30

## 2016-10-23 MED ORDER — INSULIN ASPART 100 UNIT/ML ~~LOC~~ SOLN
0.0000 [IU] | Freq: Three times a day (TID) | SUBCUTANEOUS | Status: DC
  Administered 2016-10-23: 0 [IU] via SUBCUTANEOUS
  Administered 2016-10-24 (×2): 2 [IU] via SUBCUTANEOUS

## 2016-10-23 MED ORDER — HYDROXYZINE HCL 25 MG PO TABS
25.0000 mg | ORAL_TABLET | Freq: Three times a day (TID) | ORAL | Status: DC | PRN
  Administered 2016-10-27: 25 mg via ORAL
  Filled 2016-10-23 (×2): qty 1

## 2016-10-23 MED ORDER — ARIPIPRAZOLE 15 MG PO TABS
25.0000 mg | ORAL_TABLET | Freq: Every day | ORAL | Status: DC
  Administered 2016-10-24: 17:00:00 25 mg via ORAL
  Filled 2016-10-23 (×4): qty 1

## 2016-10-23 MED ORDER — MAGNESIUM HYDROXIDE 400 MG/5ML PO SUSP
30.0000 mL | Freq: Every day | ORAL | Status: DC | PRN
  Administered 2016-10-30: 30 mL via ORAL
  Filled 2016-10-23: qty 30

## 2016-10-23 MED ORDER — DIPHENHYDRAMINE HCL 50 MG/ML IJ SOLN
25.0000 mg | Freq: Once | INTRAMUSCULAR | Status: AC | PRN
  Administered 2016-10-24: 25 mg via INTRAMUSCULAR
  Filled 2016-10-23: qty 1

## 2016-10-23 NOTE — ED Notes (Signed)
Counselor called from [facility] and they have a bed 505-1 with attending Dr. Parke Poisson. PT can arrive after 1300 today. Call [phone] for report.

## 2016-10-23 NOTE — ED Notes (Signed)
[facility] arrived to transport patient to [facility].

## 2016-10-23 NOTE — ED Notes (Signed)
Pt talking to herself, praying, mentioning things about "molesting". Pt speaking in language unable to understand and shaking

## 2016-10-23 NOTE — Progress Notes (Signed)
Patient ID: [name] [name], female   DOB: [birth year], 36 y.o.   MRN: [id number]  D: Patient in room on approach. Pt appears guard and suspicious. Pt answered some of writer's questions. Pt mumbles when asked to repeat she shuts down. Pt denies A/H but appears to be responding to internal stimulations. Pt denies pain, SI/HI.    A: Medications administered as prescribed. Pt refused scheduled oxybutynin stating "it is not mine, please don't give it to me I don't want to die".Support and encouragement provided as needed.   R: Patient remains safe on the unit.
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
## 2016-10-23 NOTE — Progress Notes (Signed)
Patient ID: [name] [name], female   DOB: 1980-12-19, 36 y.o.   MRN: [id number]  PER STATE REGULATIONS 482.30  THIS CHART WAS REVIEWED FOR MEDICAL NECESSITY WITH RESPECT TO THE PATIENT'S ADMISSION/DURATION OF STAY.  NEXT REVIEW DATE: 10/27/16   Roma Schanz, RN, BSN CASE MANAGER

## 2016-10-23 NOTE — Tx Team (Signed)
Initial Treatment Plan 10/23/2016 4:56 PM Laurell Roof WKG:[id number]    PATIENT STRESSORS: Other: Psychosis   PATIENT STRENGTHS: Average or above average intelligence Physical Health Religious Affiliation   PATIENT IDENTIFIED PROBLEMS: Psychosis  *Patient refused to respond                   DISCHARGE CRITERIA:  Ability to meet basic life and health needs Adequate post-discharge living arrangements Improved stabilization in mood, thinking, and/or behavior Need for constant or close observation no longer present Verbal commitment to aftercare and medication compliance  PRELIMINARY DISCHARGE PLAN: Outpatient therapy Return to previous living arrangement  PATIENT/FAMILY INVOLVEMENT: This treatment plan has been presented to and reviewed with the patient, [name] [name].  The patient and family have been given the opportunity to ask questions and make suggestions.  Dustin Flock, RN 10/23/2016, 4:56 PM

## 2016-10-23 NOTE — ED Notes (Signed)
Pt given water and crackers,

## 2016-10-23 NOTE — Progress Notes (Signed)
Adult Psychoeducational Group Note  Date:  10/23/2016 Time:  9:06 PM  Group Topic/Focus:  Wrap-Up Group:   The focus of this group is to help patients review their daily goal of treatment and discuss progress on daily workbooks.  Participation Level:  Did Not Attend  Participation Quality:  Did not attend  Affect:  Did not attend   Cognitive:  Did not attend  Insight: None  Engagement in Group:  Did not attend  Modes of Intervention:  Did not attend  Additional Comments:  Patient did not attend wrap up group tonight.   Tameron Lama L  [place] 10/23/2016, 9:06 PM

## 2016-10-24 DIAGNOSIS — F250 Schizoaffective disorder, bipolar type: Principal | ICD-10-CM

## 2016-10-24 DIAGNOSIS — R443 Hallucinations, unspecified: Secondary | ICD-10-CM

## 2016-10-24 DIAGNOSIS — F419 Anxiety disorder, unspecified: Secondary | ICD-10-CM

## 2016-10-24 DIAGNOSIS — F22 Delusional disorders: Secondary | ICD-10-CM

## 2016-10-24 DIAGNOSIS — F411 Generalized anxiety disorder: Secondary | ICD-10-CM

## 2016-10-24 DIAGNOSIS — R450 Nervousness: Secondary | ICD-10-CM

## 2016-10-24 DIAGNOSIS — N3281 Overactive bladder: Secondary | ICD-10-CM

## 2016-10-24 DIAGNOSIS — Z813 Family history of other psychoactive substance abuse and dependence: Secondary | ICD-10-CM

## 2016-10-24 DIAGNOSIS — G4700 Insomnia, unspecified: Secondary | ICD-10-CM

## 2016-10-24 LAB — GLUCOSE, CAPILLARY
GLUCOSE-CAPILLARY: 140 mg/dL — AB (ref 65–99)
Glucose-Capillary: 109 mg/dL — ABNORMAL HIGH (ref 65–99)
Glucose-Capillary: 122 mg/dL — ABNORMAL HIGH (ref 65–99)

## 2016-10-24 NOTE — [facility] Group Notes (Signed)
LCSW Group Therapy Note   10/24/2016 1:15pm   Type of Therapy and Topic:  Group Therapy:  Positive Affirmations   Participation Level:  Did Not Attend  Description of Group: This group addressed positive affirmation toward self and others. Patients went around the room and identified two positive things about themselves and two positive things about a peer in the room. Patients reflected on how it felt to share something positive with others, to identify positive things about themselves, and to hear positive things from others. Patients were encouraged to have a daily reflection of positive characteristics or circumstances.  Therapeutic Goals 1. Patient will verbalize two of their positive qualities 2. Patient will demonstrate empathy for others by stating two positive qualities about a peer in the group 3. Patient will verbalize their feelings when voicing positive self affirmations and when voicing positive affirmations of others 4. Patients will discuss the potential positive impact on their wellness/recovery of focusing on positive traits of self and others. Summary of Patient Progress:    Therapeutic Modalities Cognitive Behavioral Therapy Motivational Interviewing  Riley Lam Work 10/24/2016 2:35 PM

## 2016-10-24 NOTE — Tx Team (Signed)
Interdisciplinary Treatment and Diagnostic Plan Update  10/24/2016 Time of Session: 11:26 AM  [name] [name] MRN: [id number]  Principal Diagnosis: Schizoaffective disorder, Bipolar-type Secondary Diagnoses: Active Problems:   Schizoaffective disorder, bipolar type (HCC)   Current Medications:  Current Facility-Administered Medications  Medication Dose Route Frequency Provider Last Rate Last Dose  . alum & mag hydroxide-simeth (MAALOX/MYLANTA) 200-200-20 MG/5ML suspension 30 mL  30 mL Oral Q4H PRN Rankin, Shuvon B, NP      . ARIPiprazole (ABILIFY) tablet 25 mg  25 mg Oral Q supper Rankin, Shuvon B, NP      . haloperidol lactate (HALDOL) injection 1 mg  1 mg Intramuscular Once PRN Rankin, Shuvon B, NP       And  . LORazepam (ATIVAN) injection 1 mg  1 mg Intramuscular Once PRN Rankin, Shuvon B, NP       And  . diphenhydrAMINE (BENADRYL) injection 25 mg  25 mg Intramuscular Once PRN Rankin, Shuvon B, NP      . hydrOXYzine (ATARAX/VISTARIL) tablet 25 mg  25 mg Oral TID PRN Rankin, Shuvon B, NP      . insulin aspart (novoLOG) injection 0-15 Units  0-15 Units Subcutaneous TID WC Rankin, Shuvon B, NP   2 Units at 10/24/16 3295  . magnesium hydroxide (MILK OF MAGNESIA) suspension 30 mL  30 mL Oral Daily PRN Rankin, Shuvon B, NP      . metFORMIN (GLUCOPHAGE) tablet 500 mg  500 mg Oral Q breakfast Rankin, Shuvon B, NP      . ondansetron (ZOFRAN) tablet 4 mg  4 mg Oral Q8H PRN Rankin, Shuvon B, NP      . oxybutynin (DITROPAN-XL) 24 hr tablet 10 mg  10 mg Oral QHS Rankin, Shuvon B, NP      . temazepam (RESTORIL) capsule 15 mg  15 mg Oral QHS Rankin, Shuvon B, NP   15 mg at 10/23/16 2125    PTA Medications: Prescriptions Prior to Admission  Medication Sig Dispense Refill Last Dose  . divalproex (DEPAKOTE) 500 MG DR tablet Take 500 mg by mouth 2 (two) times daily.   Past Week at Unknown time  . ibuprofen (ADVIL,MOTRIN) 200 MG tablet Take 800 mg by mouth every 6 (six) hours as needed for moderate  pain.   unknown  . Multiple Vitamin (MULTIVITAMIN WITH MINERALS) TABS tablet Take 1 tablet by mouth daily.   Past Week at Unknown time  . RISPERIDONE PO Take by mouth.   Past Week at Unknown time    Treatment Modalities: Medication Management, Group therapy, Case management,  1 to 1 session with clinician, Psychoeducation, Recreational therapy.  Patient Stressors: Other: Psychosis  Patient Strengths: Average or above average intelligence Physical Health Religious Affiliation   Physician Treatment Plan for Primary Diagnosis: Schizoaffective disorder, Bipolar-type Long Term Goal(s): Improvement in symptoms so as ready for discharge  Short Term Goals: Ability to verbalize feelings will improve Compliance with prescribed medications will improve Ability to identify and develop effective coping behaviors will improve Compliance with prescribed medications will improve  Medication Management: Evaluate patient's response, side effects, and tolerance of medication regimen.  Therapeutic Interventions: 1 to 1 sessions, Unit Group sessions and Medication administration.  Evaluation of Outcomes: Progressing  Physician Treatment Plan for Secondary Diagnosis: Active Problems:   Schizoaffective disorder, bipolar type ([place])  Long Term Goal(s): Improvement in symptoms so as ready for discharge  Short Term Goals: Ability to verbalize feelings will improve Compliance with prescribed medications will improve Ability to identify and develop  effective coping behaviors will improve Compliance with prescribed medications will improve  Medication Management: Evaluate patient's response, side effects, and tolerance of medication regimen.  Therapeutic Interventions: 1 to 1 sessions, Unit Group sessions and Medication administration.  Evaluation of Outcomes: Progressing   RN Treatment Plan for Primary Diagnosis: Schizoaffective disorder, Bipolar-type Long Term Goal(s): Knowledge of disease and  therapeutic regimen to maintain health will improve  Short Term Goals: Ability to participate in decision making will improve, Ability to verbalize feelings will improve, Ability to identify and develop effective coping behaviors will improve and Compliance with prescribed medications will improve  Medication Management: RN will administer medications as ordered by provider, will assess and evaluate patient's response and provide education to patient for prescribed medication. RN will report any adverse and/or side effects to prescribing provider.  Therapeutic Interventions: 1 on 1 counseling sessions, Psychoeducation, Medication administration, Evaluate responses to treatment, Monitor vital signs and CBGs as ordered, Perform/monitor CIWA, COWS, AIMS and Fall Risk screenings as ordered, Perform wound care treatments as ordered.  Evaluation of Outcomes: Progressing   LCSW Treatment Plan for Primary Diagnosis: Schizoaffective disorder, Bipolar-type Long Term Goal(s): Safe transition to appropriate next level of care at discharge, Engage patient in therapeutic group addressing interpersonal concerns.  Short Term Goals: Engage patient in aftercare planning with referrals and resources, Increase social support, Increase ability to appropriately verbalize feelings, Facilitate acceptance of mental health diagnosis and concerns, Identify triggers associated with mental health/substance abuse issues and Increase skills for wellness and recovery  Therapeutic Interventions: Assess for all discharge needs, 1 to 1 time with Social worker, Explore available resources and support systems, Assess for adequacy in community support network, Educate family and significant other(s) on suicide prevention, Complete Psychosocial Assessment, Interpersonal group therapy.  Evaluation of Outcomes: Progressing   Progress in Treatment: Attending groups: No Participating in groups: No Taking medication as prescribed:  Yes Toleration of medication: Yes, no side effects reported at this time Family/Significant other contact made: [place] [phone] Patient understands diagnosis: No, limited insight  Discussing patient identified problems/goals with staff: Yes Medical problems stabilized or resolved: Yes Denies suicidal/homicidal ideation: Yes Issues/concerns per patient self-inventory: None Other: N/A  New problem(s) identified: None identified at this time.   New Short Term/Long Term Goal(s): Pt would like help with "Getting back on medications", as reported by [facility].   Discharge Plan or Barriers: Upon discharge pt will return to her room at the home of [facility]   Reason for Continuation of Hospitalization: Anxiety  Depression Hallucinations Medication stabilization   Estimated Length of Stay: 10/29/16  Attendees: Patient: [name] [name]  10/24/2016  11:26 AM  Physician: Maris Berger, MD 10/24/2016  11:26 AM  Nursing: Jonette Mate, RN 10/24/2016  11:26 AM  RN Care Manager: Lars Pinks, RN 10/24/2016  11:26 AM  Social Worker: Ripley Fraise, [place]; Verdis Frederickson, Social Work Intern 10/24/2016  11:26 AM  Recreational Therapist: Victorino Sparrow, LRT 10/24/2016  11:26 AM  Other: Norberto Sorenson, [place] 10/24/2016  11:26 AM  Other:  10/24/2016  11:26 AM  Other: 10/24/2016  11:26 AM    Scribe for Treatment Team: [name], Student-Social Work 10/24/2016 11:26 AM
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
## 2016-10-24 NOTE — Progress Notes (Signed)
Recreation Therapy Notes  INPATIENT RECREATION THERAPY ASSESSMENT  Patient Details Name: [name] [name] MRN: [id number] DOB: December 11, 1980 Today's Date: 10/24/2016  Patient Stressors:  Marland KitchenI don't remember".)  Pt stated "I believe I hurt myself".  Coping Skills:   Isolate, Avoidance, Self-Injury, Art/Dance, Music  Personal Challenges:  (Pt did not answer)  Leisure Interests (2+):  Games - Cards, Music - Listen, Individual - TV  Awareness of Community Resources:   ("I don't know what that is".)  Patient Strengths:  Love God, like safety  Patient Identified Areas of Improvement:  Hygiene, appearance  Current Recreation Participation:  Pt did not answer  Patient Goal for Hospitalization:  Pt did not answer  City of Residence:  Pt did not answer  [place] of Residence:  Pt did not answer  Current SI (including self-harm):  No  Current HI:   (Did not answer)  Consent to Intern Participation: N/A   Victorino Sparrow, LRT/CTRS  Ria Comment, Tyronne Blann A 10/24/2016, 2:52 PM
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
## 2016-10-24 NOTE — Plan of Care (Signed)
Problem: Health Behavior/Discharge Planning: Goal: Compliance with prescribed medication regimen will improve Outcome: Not Progressing Patient refused prescribed medication after several encouragement.
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
## 2016-10-24 NOTE — Progress Notes (Signed)
Dar Note: Patient with flat affect and depressed mood.  Observed responding to internal stimuli while in pacing the hallway and in her room.  Period of thought blocking during assessment.  Refused morning medication after several encouragements.  MD made aware.  Food and fluid intake encouraged.  Patient cued and supervised during meal.  Routine safety checks maintained.  Patient safe on the unit.  Patient needed a lot of redirection on the unit.

## 2016-10-24 NOTE — [facility] Suicide Risk Assessment (Signed)
[place] INPATIENT:  Family/Significant Other Suicide Prevention Education  Suicide Prevention Education:  Education Completed; [name] [name] [phone] has been identified by the patient as the family member/significant other with whom the patient will be residing, and identified as the person(s) who will aid the patient in the event of a mental health crisis (suicidal ideations/suicide attempt).  With written consent from the patient, the family member/significant other has been provided the following suicide prevention education, prior to the and/or following the discharge of the patient.  The suicide prevention education provided includes the following:  Suicide risk factors  Suicide prevention and interventions  National Suicide Hotline telephone number  [facility] assessment telephone number  [facility] Emergency Assistance [place] and/or Residential Mobile Crisis Unit telephone number  Request made of family/significant other to:  Remove weapons (e.g., guns, rifles, knives), all items previously/currently identified as safety concern.    Remove drugs/medications (over-the-counter, prescriptions, illicit drugs), all items previously/currently identified as a safety concern.  The family member/significant other verbalizes understanding of the suicide prevention education information provided.  The family member/significant other agrees to remove the items of safety concern listed above.  [name] [name] reports that pt experiences episodes each year when it gets close to the anniversary of her mothers death in 2022/12/27.      [name] [name] 10/24/2016, 11:31 AM

## 2016-10-24 NOTE — Progress Notes (Signed)
Recreation Therapy Notes  Date: 10/24/16 Time: 1000 Location: [address] Hall Dayroom  Group Topic: Leisure Education  Goal Area(s) Addresses:  Patient will identify positive leisure activities.  Patient will identify one positive benefit of participation in leisure activities.   Intervention: Chairs, small beach ball  Activity: Keep It Going Volleyball.  LRT seated patients in a circle.  Patients were to toss the ball back and fourth to each other without letting the ball come to a stop.  Patients could bounce the ball off the floor but the wall was to remain moving at all times.  LRT would count the number of hits on the ball.  If the ball stopped the count would start over.  Education:  Leisure Education, Dentist  Education Outcome: Acknowledges education/In group clarification offered/Needs additional education  Clinical Observations/Feedback: Pt did not attend group.   Victorino Sparrow, LRT/CTRS         Victorino Sparrow A 10/24/2016 12:28 PM

## 2016-10-25 DIAGNOSIS — Z62810 Personal history of physical and sexual abuse in childhood: Secondary | ICD-10-CM

## 2016-10-25 DIAGNOSIS — Z9114 Patient's other noncompliance with medication regimen: Secondary | ICD-10-CM

## 2016-10-25 LAB — GLUCOSE, CAPILLARY
GLUCOSE-CAPILLARY: 119 mg/dL — AB (ref 65–99)
GLUCOSE-CAPILLARY: 97 mg/dL (ref 65–99)
Glucose-Capillary: 140 mg/dL — ABNORMAL HIGH (ref 65–99)

## 2016-10-25 MED ORDER — DIPHENHYDRAMINE HCL 50 MG/ML IJ SOLN
INTRAMUSCULAR | Status: AC
Start: 2016-10-25 — End: 2016-10-25
  Filled 2016-10-25: qty 1

## 2016-10-25 MED ORDER — LORAZEPAM 1 MG PO TABS
ORAL_TABLET | ORAL | Status: AC
  Filled 2016-10-25: qty 1

## 2016-10-25 MED ORDER — ARIPIPRAZOLE 15 MG PO TABS
30.0000 mg | ORAL_TABLET | Freq: Every day | ORAL | Status: DC
  Administered 2016-10-26: 15 mg via ORAL
  Administered 2016-10-28: 30 mg via ORAL
  Filled 2016-10-25 (×5): qty 2

## 2016-10-25 MED ORDER — HALOPERIDOL LACTATE 5 MG/ML IJ SOLN
INTRAMUSCULAR | Status: AC
  Administered 2016-10-25: 12:00:00
  Filled 2016-10-25: qty 1

## 2016-10-25 MED ORDER — BENZTROPINE MESYLATE 1 MG PO TABS
1.0000 mg | ORAL_TABLET | Freq: Every day | ORAL | Status: DC
  Administered 2016-10-26 – 2016-10-30 (×5): 1 mg via ORAL
  Filled 2016-10-25 (×9): qty 1

## 2016-10-25 MED ORDER — LORAZEPAM 1 MG PO TABS
1.0000 mg | ORAL_TABLET | Freq: Four times a day (QID) | ORAL | Status: DC | PRN
  Administered 2016-10-28 – 2016-10-29 (×2): 1 mg via ORAL
  Filled 2016-10-25 (×2): qty 1

## 2016-10-25 MED ORDER — LORAZEPAM 2 MG/ML IJ SOLN
INTRAMUSCULAR | Status: AC
  Administered 2016-10-25: 1 mg
  Filled 2016-10-25: qty 1

## 2016-10-25 MED ORDER — HALOPERIDOL 5 MG PO TABS
ORAL_TABLET | ORAL | Status: AC
  Filled 2016-10-25: qty 1

## 2016-10-25 MED ORDER — LORAZEPAM 2 MG/ML IJ SOLN: 1.0000 mg | Freq: Four times a day (QID) | INTRAMUSCULAR | Status: DC | PRN

## 2016-10-25 MED ORDER — HALOPERIDOL LACTATE 5 MG/ML IJ SOLN
5.0000 mg | Freq: Four times a day (QID) | INTRAMUSCULAR | Status: DC | PRN
Start: 2016-10-25 — End: 2016-10-31

## 2016-10-25 MED ORDER — HALOPERIDOL 5 MG PO TABS: 5.0000 mg | ORAL_TABLET | Freq: Four times a day (QID) | ORAL | Status: DC | PRN

## 2016-10-25 MED ORDER — DIPHENHYDRAMINE HCL 50 MG/ML IJ SOLN
25.0000 mg | Freq: Once | INTRAMUSCULAR | Status: AC
  Administered 2016-10-25: 25 mg via INTRAMUSCULAR
  Filled 2016-10-25: qty 0.5

## 2016-10-25 NOTE — Progress Notes (Signed)
Patient ID: [name] [name], female   DOB: [birth year], 36 y.o.   MRN: [id number]  Pt is safe in bed asleep. Respiration even and unlabored.
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
## 2016-10-26 LAB — GLUCOSE, CAPILLARY
GLUCOSE-CAPILLARY: 70 mg/dL (ref 65–99)
GLUCOSE-CAPILLARY: 149 mg/dL — AB (ref 65–99)
Glucose-Capillary: 110 mg/dL — ABNORMAL HIGH (ref 65–99)

## 2016-10-26 MED ORDER — DOCUSATE SODIUM 100 MG PO CAPS
100.0000 mg | ORAL_CAPSULE | Freq: Every day | ORAL | Status: DC | PRN
  Administered 2016-10-26 – 2016-10-29 (×3): 100 mg via ORAL
  Filled 2016-10-26 (×3): qty 1

## 2016-10-26 MED ORDER — IBUPROFEN 600 MG PO TABS
600.0000 mg | ORAL_TABLET | Freq: Four times a day (QID) | ORAL | Status: DC | PRN
  Administered 2016-10-26 – 2016-10-28 (×4): 600 mg via ORAL
  Filled 2016-10-26 (×4): qty 1

## 2016-10-26 NOTE — Progress Notes (Signed)
[facility] MD Progress Note  10/26/2016 10:51 AM [name] [name]  MRN:  [id number] Subjective:  Leave me alone.  I don't want to take medication.  Objective; patient seen chart reviewed.  Patient remains very disorganized, paranoid and delusional.  Yesterday she was given when necessary medication because she was very agitated and threw water on the staff.  She is refusing medication.  Last night she refused Abilify but this morning after some encouragement she took only 15 mg.  She remains very isolated, withdrawn, irritable, paranoid and labile.  She has thought blocking and some time at this point to endorse stimuli.  She does not participate in the groups.  Principal Problem: Schizoaffective disorder, bipolar type ([place]) Diagnosis:   Patient Active Problem List   Diagnosis Date Noted  . Insomnia [G47.00]   . Anxiety state [F41.1]   . Overactive bladder [N32.81]   . Diabetes mellitus ([place]) [E11.9] 02/08/2015  . Schizoaffective disorder, bipolar type ([place]) [F25.0] 01/28/2015  . Non compliance w medication regimen [Z91.14]    Total Time spent with patient: 20 minutes  Past Psychiatric History: Reviewed.  Past Medical History:  Past Medical History:  Diagnosis Date  . Bipolar affective disorder, currently manic, mild ([place])   . Diabetes mellitus without complication ([place])   . Schizophrenia [facility])     Past Surgical History:  Procedure Laterality Date  . WISDOM TOOTH EXTRACTION     Family History:  Family History  Problem Relation Age of Onset  . Drug abuse Maternal Uncle    Family Psychiatric  History: Reviewed. Social History:  History  Alcohol Use No     History  Drug Use No    Social History   Social History  . Marital status: Legally Separated    Spouse name: N/A  . Number of children: N/A  . Years of education: N/A   Social History Main Topics  . Smoking status: Never Smoker  . Smokeless tobacco: Never Used  . Alcohol use No  . Drug use: No  . Sexual activity: Not  Currently   Other Topics Concern  . None   Social History Narrative  . None   Additional Social History:                         Sleep: Fair  Appetite:  Fair  Current Medications: Current Facility-Administered Medications  Medication Dose Route Frequency Provider Last Rate Last Dose  . alum & mag hydroxide-simeth (MAALOX/MYLANTA) 200-200-20 MG/5ML suspension 30 mL  30 mL Oral Q4H PRN Rankin, Shuvon B, NP      . ARIPiprazole (ABILIFY) tablet 30 mg  30 mg Oral Q supper Jvon Meroney, Arlyce Harman, MD   15 mg at 10/26/16 1019  . benztropine (COGENTIN) tablet 1 mg  1 mg Oral QHS Ayelet Gruenewald T, MD      . haloperidol (HALDOL) tablet 5 mg  5 mg Oral Q6H PRN Lashell Moffitt, Arlyce Harman, MD       Or  . haloperidol lactate (HALDOL) injection 5 mg  5 mg Intramuscular Q6H PRN Talena Neira, Arlyce Harman, MD      . hydrOXYzine (ATARAX/VISTARIL) tablet 25 mg  25 mg Oral TID PRN Rankin, Shuvon B, NP      . insulin aspart (novoLOG) injection 0-15 Units  0-15 Units Subcutaneous TID WC Rankin, Shuvon B, NP   2 Units at 10/24/16 1649  . LORazepam (ATIVAN) tablet 1 mg  1 mg Oral Q6H PRN Twana Wileman, Arlyce Harman, MD  Or  . LORazepam (ATIVAN) injection 1 mg  1 mg Intramuscular Q6H PRN Shantia Sanford, Arlyce Harman, MD      . magnesium hydroxide (MILK OF MAGNESIA) suspension 30 mL  30 mL Oral Daily PRN Rankin, Shuvon B, NP      . metFORMIN (GLUCOPHAGE) tablet 500 mg  500 mg Oral Q breakfast Rankin, Shuvon B, NP   500 mg at 10/25/16 0830  . ondansetron (ZOFRAN) tablet 4 mg  4 mg Oral Q8H PRN Rankin, Shuvon B, NP      . oxybutynin (DITROPAN-XL) 24 hr tablet 10 mg  10 mg Oral QHS Rankin, Shuvon B, NP      . temazepam (RESTORIL) capsule 15 mg  15 mg Oral QHS Rankin, Shuvon B, NP   15 mg at 10/23/16 2125    Lab Results:  Results for orders placed or performed during the hospital encounter of 10/23/16 (from the past 48 hour(s))  Glucose, capillary     Status: Abnormal   Collection Time: 10/24/16 11:55 AM  Result Value Ref Range   Glucose-Capillary 109  (H) 65 - 99 mg/dL   Comment 1 Notify RN    Comment 2 Document in Chart   Glucose, capillary     Status: Abnormal   Collection Time: 10/24/16  4:48 PM  Result Value Ref Range   Glucose-Capillary 122 (H) 65 - 99 mg/dL  Glucose, capillary     Status: Abnormal   Collection Time: 10/25/16  6:28 AM  Result Value Ref Range   Glucose-Capillary 119 (H) 65 - 99 mg/dL  Glucose, capillary     Status: None   Collection Time: 10/25/16 11:44 AM  Result Value Ref Range   Glucose-Capillary 97 65 - 99 mg/dL  Glucose, capillary     Status: Abnormal   Collection Time: 10/25/16  9:03 PM  Result Value Ref Range   Glucose-Capillary 140 (H) 65 - 99 mg/dL  Glucose, capillary     Status: None   Collection Time: 10/26/16  6:24 AM  Result Value Ref Range   Glucose-Capillary 70 65 - 99 mg/dL    Blood Alcohol level:  Lab Results  Component Value Date   ETH <10 10/21/2016   ETH <5 40/98/1191    Metabolic Disorder Labs: Lab Results  Component Value Date   HGBA1C 5.6 07/25/2015   MPG 114 07/25/2015   MPG 137 02/01/2015   Lab Results  Component Value Date   PROLACTIN 46.5 (H) 07/25/2015   PROLACTIN 21.6 02/01/2015   Lab Results  Component Value Date   CHOL 135 07/25/2015   TRIG 59 07/25/2015   HDL 50 07/25/2015   CHOLHDL 2.7 07/25/2015   VLDL 12 07/25/2015   LDLCALC 73 07/25/2015   LDLCALC 78 02/01/2015    Physical Findings: AIMS: Facial and Oral Movements Muscles of Facial Expression: None, normal Lips and Perioral Area: None, normal Jaw: None, normal Tongue: None, normal,Extremity Movements Upper (arms, wrists, hands, fingers): None, normal Lower (legs, knees, ankles, toes): None, normal, Trunk Movements Neck, shoulders, hips: None, normal, Overall Severity Severity of abnormal movements (highest score from questions above): None, normal Incapacitation due to abnormal movements: None, normal Patient's awareness of abnormal movements (rate only patient's report): No Awareness, Dental  Status Current problems with teeth and/or dentures?: No Does patient usually wear dentures?: No  CIWA:    COWS:     Musculoskeletal: Strength & Muscle Tone: within normal limits Gait & Station: normal Patient leans: N/A  Psychiatric Specialty Exam: Physical Exam  Review of  Systems  Constitutional: Negative.   HENT: Negative for hearing loss.   Respiratory: Negative.   Cardiovascular: Negative.   Genitourinary: Negative.   Musculoskeletal: Negative.   Skin: Negative.   Neurological: Negative.   Psychiatric/Behavioral: Positive for hallucinations. The patient has insomnia.     Blood pressure 128/72, pulse (!) 131, temperature 98.3 F (36.8 C), resp. rate 20, height 5\' 3"  (1.6 m), weight 72.1 kg (159 lb), SpO2 100 %.Body mass index is 28.17 kg/m.  General Appearance: Disheveled and Guarded  Eye Contact:  Poor  Speech:  Slow  Volume:  Normal  Mood:  Irritable  Affect:  Inappropriate and Labile  Thought Process:  Disorganized and Descriptions of Associations: Loose  Orientation:  Full (Time, Place, and Person)  Thought Content:  Delusions, Hallucinations: Auditory, Obsessions, Tangential and Thought blocking  Suicidal Thoughts:  No  Homicidal Thoughts:  No  Memory:  Immediate;   Fair Recent;   Fair Remote;   Fair  Judgement:  Impaired  Insight:  Lacking  Psychomotor Activity:  Increased  Concentration:  Concentration: Poor and Attention Span: Poor  Recall:  [employer] of Knowledge:  Fair  Language:  Fair  Akathisia:  No  Handed:  Right  AIMS (if indicated):     Assets:  Housing  ADL's:  Intact  Cognition:  Impaired,  Moderate  Sleep:  Number of Hours: 4.5     Treatment Plan Summary: Daily contact with patient to assess and evaluate symptoms and progress in treatment  Patient remains very disorganized, paranoid and having thought blocking.  She is refusing medication.  She's not going to the groups. After some encouragement she took only 50 mg Abilify.  We will  consider second opinion for forced medication if patient continued to refuse medication.  Review blood work results. Encouraged to participate in group therapy.  Continue when necessary medication for agitation.  Takari Duncombe T., MD 10/26/2016, 10:51 AM

## 2016-10-26 NOTE — Progress Notes (Signed)
Patient calm and isolated in her room this evening. Unwilling to participate in her treatment and presents with elective mutism. She continues to be paranoid and suspicious. Refuses to accept her bedtime med state "I don't want anyone to poison me". She turned and covered her face. Patient in bed sleeping on/off. Will continue to monitor patient.
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
## 2016-10-26 NOTE — Progress Notes (Signed)
The patient arrived late for group this evening and refused to share.

## 2016-10-26 NOTE — [facility] Group Notes (Signed)
[facility] LCSW Group Therapy Note  Date/Time:  10/26/2016  11:00AM-12:00PM  Type of Therapy and Topic:  Group Therapy:  Music and Mood  Participation Level:  Did Not Attend   Description of Group: In this process group, members listened to a variety of genres of music and identified that different types of music evoke different responses.  Patients were encouraged to identify music that was soothing for them and music that was energizing for them.  Patients discussed how this knowledge can help with wellness and recovery in various ways including managing depression and anxiety as well as encouraging healthy sleep habits.    Therapeutic Goals: 1. Patients will explore the impact of different varieties of music on mood 2. Patients will verbalize the thoughts they have when listening to different types of music 3. Patients will identify music that is soothing to them as well as music that is energizing to them 4. Patients will discuss how to use this knowledge to assist in maintaining wellness and recovery 5. Patients will explore the use of music as a coping skill  Summary of Patient Progress:  N/A  Therapeutic Modalities: Solution Focused Brief Therapy Motivational Interviewing Activity   Selmer Dominion, LCSW 10/26/2016 9:17 AM

## 2016-10-26 NOTE — Plan of Care (Signed)
Problem: Safety: Goal: Periods of time without injury will increase Outcome: Progressing Pt safe on the unit at this time   

## 2016-10-26 NOTE — Progress Notes (Signed)
D: Pt denies SI/HI/AVH. Pt is pleasant and cooperative. Pt appears suspicious, and stays to herself, but pt spoke with writer this evening about making bad decisions in her past. Pt seen on the phone a few times this evening  A: Pt was offered support and encouragement. Pt was given scheduled medications. Pt was encourage to attend groups. Q 15 minute checks were done for safety.   R:Pt attends groups and interacts well with peers and staff. Pt is taking medication. Pt has no complaints.Pt receptive to treatment and safety maintained on unit.

## 2016-10-27 LAB — GLUCOSE, CAPILLARY
GLUCOSE-CAPILLARY: 128 mg/dL — AB (ref 65–99)
Glucose-Capillary: 83 mg/dL (ref 65–99)
Glucose-Capillary: 87 mg/dL (ref 65–99)

## 2016-10-27 NOTE — Plan of Care (Signed)
Problem: Safety: Goal: Periods of time without injury will increase Outcome: Progressing Patient is safe and free from injury.  Routine safety checks maintained every 15 minutes.   

## 2016-10-27 NOTE — Progress Notes (Signed)
Patient ID: [name] [name], female   DOB: 03/26/1980, 36 y.o.   MRN: [id number] [facility] MD Progress Note  10/27/2016 3:13 PM Shannan Slinker  MRN:  [id number] Subjective:  "I can't describe what I'm hearing. I'm not suicidal, I just don't like living."  Objective; patient seen chart reviewed.  Patient displays ongoing disorganization, delusions, and paranoia, but she has shown some improvement in her ability to interact with peers/staff. She has been attending group. She notes that she has been sleeping adequately. She describes some pain in her legs and expresses concern that her pain is caused by gangrene. Discussed with patient that she has no signs/symptoms of gangrene which would explain her leg pain, and pt was in agreement to trial of ibuprofen. Pt has been taking medications as directed, but over the weekend she was refusing some of her medications. With encouragement, pt has been taking oral medications. She denies SI/HI but she explains "I'm not suicidal,  I just don't like living." Pt contracts for safety while on the unit. She appears to respond to internal stimuli but when asked about [place], she denies however she states "I can't describe what I'm hearing." She has been attending groups but has not participated significantly. She is tolerating her current medications without difficulty. She agrees to continue current regimen without changes. She had no further questions, comments, or concerns.   Principal Problem: Schizoaffective disorder, bipolar type ([employer]) Diagnosis:   Patient Active Problem List   Diagnosis Date Noted  . Insomnia [G47.00]   . Anxiety state [F41.1]   . Overactive bladder [N32.81]   . Diabetes mellitus ([place]) [E11.9] 02/08/2015  . Schizoaffective disorder, bipolar type ([place]) [F25.0] 01/28/2015  . Non compliance w medication regimen [Z91.14]    Total Time spent with patient: 30 minutes  Past Psychiatric History: Reviewed.  Past Medical History:  Past Medical History:   Diagnosis Date  . Bipolar affective disorder, currently manic, mild ([place])   . Diabetes mellitus without complication ([place])   . Schizophrenia [facility])     Past Surgical History:  Procedure Laterality Date  . WISDOM TOOTH EXTRACTION     Family History:  Family History  Problem Relation Age of Onset  . Drug abuse Maternal Uncle    Family Psychiatric  History: Reviewed. Social History:  History  Alcohol Use No     History  Drug Use No    Social History   Social History  . Marital status: Legally Separated    Spouse name: N/A  . Number of children: N/A  . Years of education: N/A   Social History Main Topics  . Smoking status: Never Smoker  . Smokeless tobacco: Never Used  . Alcohol use No  . Drug use: No  . Sexual activity: Not Currently   Other Topics Concern  . None   Social History Narrative  . None   Additional Social History:                         Sleep: Good  Appetite:  Good  Current Medications: Current Facility-Administered Medications  Medication Dose Route Frequency Provider Last Rate Last Dose  . alum & mag hydroxide-simeth (MAALOX/MYLANTA) 200-200-20 MG/5ML suspension 30 mL  30 mL Oral Q4H PRN Rankin, Shuvon B, NP   30 mL at 10/27/16 0258  . ARIPiprazole (ABILIFY) tablet 30 mg  30 mg Oral Q supper Arfeen, Arlyce Harman, MD   15 mg at 10/26/16 1019  . benztropine (COGENTIN) tablet 1 mg  1 mg Oral QHS Kathlee Nations, MD   1 mg at 10/26/16 2111  . docusate sodium (COLACE) capsule 100 mg  100 mg Oral Daily PRN Derrill Center, NP   100 mg at 10/26/16 1852  . haloperidol (HALDOL) tablet 5 mg  5 mg Oral Q6H PRN Arfeen, Arlyce Harman, MD       Or  . haloperidol lactate (HALDOL) injection 5 mg  5 mg Intramuscular Q6H PRN Arfeen, Arlyce Harman, MD      . hydrOXYzine (ATARAX/VISTARIL) tablet 25 mg  25 mg Oral TID PRN Rankin, Shuvon B, NP      . ibuprofen (ADVIL,MOTRIN) tablet 600 mg  600 mg Oral Q6H PRN Derrill Center, NP   600 mg at 10/27/16 1250  . insulin aspart  (novoLOG) injection 0-15 Units  0-15 Units Subcutaneous TID WC Rankin, Shuvon B, NP   2 Units at 10/24/16 1649  . LORazepam (ATIVAN) tablet 1 mg  1 mg Oral Q6H PRN Arfeen, Arlyce Harman, MD       Or  . LORazepam (ATIVAN) injection 1 mg  1 mg Intramuscular Q6H PRN Arfeen, Arlyce Harman, MD      . magnesium hydroxide (MILK OF MAGNESIA) suspension 30 mL  30 mL Oral Daily PRN Rankin, Shuvon B, NP      . metFORMIN (GLUCOPHAGE) tablet 500 mg  500 mg Oral Q breakfast Rankin, Shuvon B, NP   500 mg at 10/25/16 0830  . ondansetron (ZOFRAN) tablet 4 mg  4 mg Oral Q8H PRN Rankin, Shuvon B, NP      . oxybutynin (DITROPAN-XL) 24 hr tablet 10 mg  10 mg Oral QHS Rankin, Shuvon B, NP   10 mg at 10/26/16 2111  . temazepam (RESTORIL) capsule 15 mg  15 mg Oral QHS Rankin, Shuvon B, NP   15 mg at 10/26/16 2111    Lab Results:  Results for orders placed or performed during the hospital encounter of 10/23/16 (from the past 48 hour(s))  Glucose, capillary     Status: Abnormal   Collection Time: 10/25/16  9:03 PM  Result Value Ref Range   Glucose-Capillary 140 (H) 65 - 99 mg/dL  Glucose, capillary     Status: None   Collection Time: 10/26/16  6:24 AM  Result Value Ref Range   Glucose-Capillary 70 65 - 99 mg/dL  Glucose, capillary     Status: Abnormal   Collection Time: 10/26/16  4:47 PM  Result Value Ref Range   Glucose-Capillary 149 (H) 65 - 99 mg/dL  Glucose, capillary     Status: Abnormal   Collection Time: 10/26/16  8:40 PM  Result Value Ref Range   Glucose-Capillary 110 (H) 65 - 99 mg/dL  Glucose, capillary     Status: None   Collection Time: 10/27/16 11:56 AM  Result Value Ref Range   Glucose-Capillary 83 65 - 99 mg/dL   Comment 1 Notify RN    Comment 2 Document in Chart     Blood Alcohol level:  Lab Results  Component Value Date   ETH <10 10/21/2016   ETH <5 43/15/4008    Metabolic Disorder Labs: Lab Results  Component Value Date   HGBA1C 5.6 07/25/2015   MPG 114 07/25/2015   MPG 137 02/01/2015    Lab Results  Component Value Date   PROLACTIN 46.5 (H) 07/25/2015   PROLACTIN 21.6 02/01/2015   Lab Results  Component Value Date   CHOL 135 07/25/2015   TRIG 59 07/25/2015   HDL  50 07/25/2015   CHOLHDL 2.7 07/25/2015   VLDL 12 07/25/2015   LDLCALC 73 07/25/2015   LDLCALC 78 02/01/2015    Physical Findings: AIMS: Facial and Oral Movements Muscles of Facial Expression: None, normal Lips and Perioral Area: None, normal Jaw: None, normal Tongue: None, normal,Extremity Movements Upper (arms, wrists, hands, fingers): None, normal Lower (legs, knees, ankles, toes): None, normal, Trunk Movements Neck, shoulders, hips: None, normal, Overall Severity Severity of abnormal movements (highest score from questions above): None, normal Incapacitation due to abnormal movements: None, normal Patient's awareness of abnormal movements (rate only patient's report): No Awareness, Dental Status Current problems with teeth and/or dentures?: No Does patient usually wear dentures?: No  CIWA:    COWS:     Musculoskeletal: Strength & Muscle Tone: within normal limits Gait & Station: normal Patient leans: N/A  Psychiatric Specialty Exam: Physical Exam  Review of Systems  Constitutional: Negative for chills and fever.  Respiratory: Negative for cough.   Cardiovascular: Negative for chest pain.  Musculoskeletal: Positive for myalgias.  Neurological: Negative.   Psychiatric/Behavioral: Positive for hallucinations. Negative for suicidal ideas.    Blood pressure 128/72, pulse (!) 131, temperature 98.3 F (36.8 C), resp. rate 20, height 5\' 3"  (1.6 m), weight 72.1 kg (159 lb), SpO2 100 %.Body mass index is 28.17 kg/m.  General Appearance: Disheveled and Guarded  Eye Contact:  Poor  Speech:  Slow - but improving  Volume:  Normal  Mood:  Anxious and Depressed  Affect:  Appropriate, Congruent, Constricted and Flat  Thought Process:  Disorganized and Descriptions of Associations: Loose   Orientation:  Full (Time, Place, and Person)  Thought Content:  Delusions, Hallucinations: Auditory, Obsessions, Tangential and Thought blocking  Suicidal Thoughts:  No  Homicidal Thoughts:  No  Memory:  Immediate;   Fair Recent;   Fair Remote;   Fair  Judgement:  Impaired  Insight:  Lacking  Psychomotor Activity:  Normal  Concentration:  Concentration: Poor and Attention Span: Poor  Recall:  [employer] of Knowledge:  Fair  Language:  Fair  Akathisia:  No  Handed:  Right  AIMS (if indicated):     Assets:  Housing  ADL's:  Intact  Cognition:  Impaired,  Moderate  Sleep:  Number of Hours: 4.5     Treatment Plan Summary: Daily contact with patient to assess and evaluate symptoms and progress in treatment  Patient continues to display significant disorganization, paranoia, and thought blocking. She is now taking medications as directed and attending groups.  -Continue abilify 30mg  qDay - Continue cogentin 1mg  qhs - Continue atarax 25mg  TID PRN anxiety - Continue temazepam 15mg  qhs Encouraged to participate in group therapy.  Continue when necessary medication for agitation. Discharge planning will be ongoing.   Pennelope Bracken, MD 10/27/2016, 3:13 PM

## 2016-10-27 NOTE — Progress Notes (Signed)
Dar Note: Patient presents with flat affect and depressed mood.  Appears paranoid, guarded and suspicious.  Observed responding to internal stimulation.  Refused Metformin and only took 15 mg of Abilify instead of 30 mg.  Threw the other half in the trash.  MD made aware.  Patient visible in the dayroom.  No interaction with staff or peers.  Refused to fill out self inventory form.  Routine safety checks maintained.  Patient is safe on the unit.  Patient continues to need a lot of redirection on the unit.

## 2016-10-27 NOTE — Progress Notes (Signed)
Did not attend group 

## 2016-10-27 NOTE — Progress Notes (Signed)
Pt came to the medication window with somatic complaints this morning

## 2016-10-27 NOTE — Progress Notes (Signed)
Recreation Therapy Notes  Date: 10/27/16 Time: 1000 Location: 500 Hall Dayroom  Group Topic: Coping Skills  Goal Area(s) Addresses:  Patients will be able to identify positive coping skills. Patients will be able to identify the benefits of using coping skills post d/c.  Intervention: Psychologist, forensic, pencils  Activity: Building surveyor.  Patients were given a picture of a blank spider web.  Patients were to identify the things that have gotten them stuck and write them inside the web.  Patients were to then identify at least two coping skills for each situation they identified.  Education: Radiographer, therapeutic, Dentist.   Education Outcome: Acknowledges understanding/In group clarification offered/Needs additional education.   Clinical Observations/Feedback: Pt did not attend group.   Victorino Sparrow, LRT/CTRS         Victorino Sparrow A 10/27/2016 12:20 PM

## 2016-10-27 NOTE — Progress Notes (Signed)
Pt stated she would like to get a multivitamin, possibly some birth control, and would like a glucose monitor to take home. Pt stated she thinks she has neuropathy and would like something for that , pt stated she needed some Eucerin cream for her face.

## 2016-10-27 NOTE — Progress Notes (Signed)
Patient ID: [name] [name], female   DOB: 12/31/1980, 36 y.o.   MRN: [id number] PER STATE REGULATIONS 482.30  THIS CHART WAS REVIEWED FOR MEDICAL NECESSITY WITH RESPECT TO THE PATIENT'S ADMISSION/ DURATION OF STAY.  NEXT REVIEW DATE: 10/31/2016  Chauncy Lean, RN, BSN CASE MANAGER

## 2016-10-28 LAB — GLUCOSE, CAPILLARY
GLUCOSE-CAPILLARY: 139 mg/dL — AB (ref 65–99)
Glucose-Capillary: 71 mg/dL (ref 65–99)

## 2016-10-28 MED ORDER — GABAPENTIN 100 MG PO CAPS
100.0000 mg | ORAL_CAPSULE | Freq: Three times a day (TID) | ORAL | Status: DC
  Administered 2016-10-28: 100 mg via ORAL
  Filled 2016-10-28 (×7): qty 1

## 2016-10-28 MED ORDER — HYDROXYZINE HCL 50 MG PO TABS
50.0000 mg | ORAL_TABLET | Freq: Three times a day (TID) | ORAL | Status: DC | PRN
  Administered 2016-10-28: 50 mg via ORAL
  Filled 2016-10-28: qty 1

## 2016-10-28 MED ORDER — ARIPIPRAZOLE 15 MG PO TABS
30.0000 mg | ORAL_TABLET | Freq: Every day | ORAL | Status: DC
  Administered 2016-10-29 – 2016-10-31 (×3): 30 mg via ORAL
  Filled 2016-10-28 (×6): qty 2

## 2016-10-28 MED ORDER — ADULT MULTIVITAMIN W/MINERALS CH
1.0000 | ORAL_TABLET | Freq: Every day | ORAL | Status: DC
  Administered 2016-10-28 – 2016-10-31 (×4): 1 via ORAL
  Filled 2016-10-28 (×7): qty 1

## 2016-10-28 MED ORDER — VITAMIN D3 25 MCG (1000 UNIT) PO TABS
1000.0000 [IU] | ORAL_TABLET | Freq: Every day | ORAL | Status: DC
  Administered 2016-10-29 – 2016-10-31 (×3): 1000 [IU] via ORAL
  Filled 2016-10-28 (×6): qty 1

## 2016-10-28 NOTE — Progress Notes (Signed)
Did not attend group 

## 2016-10-28 NOTE — Progress Notes (Signed)
Patient ID: [name] [name], female   DOB: 07-10-1980, 36 y.o.   MRN: [id number] [facility] MD Progress Note  10/28/2016 3:52 PM Jeanett Antonopoulos  MRN:  [id number]   Subjective:  "I don't want to talk about my angels and visions."   Objective; patient seen and chart reviewed.  Patient demonstrates improved ability to interact today in regards to having a discussion of her treatment regimen, but she remains guarded and paranoid overall regarding discussion of her symptoms. Pt endorses AH but she declines to describe them. She endorses VH of seeing "angels" but she is irritable with discussion of her symptoms. Pt endorses several delusions such as that she has "a dead fetus" inside of her. She also states that she will not take insulin because "a prophet told me not to inject myself with anything." Discussed with patient that we would need to be able to check her blood glucose and manage it for safety while she is at the hospital, and pt was in agreement to take metformin as directed as well as her other medications with the exception of insulin. Pt had only been taking half of her abilify dose, but with encouragement at time of interview, she agrees to take the entire dose. She requests for medication to address symptoms of peripheral neuropathy, and she agreed to trial of gabapentin. Pt denies SI/HI. We discussed option of possibly changing abilify to long-acting injectable form and pt was in agreement to consider this option as she now is in agreement to take full dose of abilify. She had no further questions, comments, or concerns.    Principal Problem: Schizoaffective disorder, bipolar type ([place]) Diagnosis:   Patient Active Problem List   Diagnosis Date Noted  . Insomnia [G47.00]   . Anxiety state [F41.1]   . Overactive bladder [N32.81]   . Diabetes mellitus ([place]) [E11.9] 02/08/2015  . Schizoaffective disorder, bipolar type ([place]) [F25.0] 01/28/2015  . Non compliance w medication regimen [Z91.14]    Total  Time spent with patient: 30 minutes  Past Psychiatric History: Reviewed.  Past Medical History:  Past Medical History:  Diagnosis Date  . Bipolar affective disorder, currently manic, mild ([place])   . Diabetes mellitus without complication ([place])   . Schizophrenia [facility])     Past Surgical History:  Procedure Laterality Date  . WISDOM TOOTH EXTRACTION     Family History:  Family History  Problem Relation Age of Onset  . Drug abuse Maternal Uncle    Family Psychiatric  History: Reviewed. Social History:  History  Alcohol Use No     History  Drug Use No    Social History   Social History  . Marital status: Legally Separated    Spouse name: N/A  . Number of children: N/A  . Years of education: N/A   Social History Main Topics  . Smoking status: Never Smoker  . Smokeless tobacco: Never Used  . Alcohol use No  . Drug use: No  . Sexual activity: Not Currently   Other Topics Concern  . None   Social History Narrative  . None   Additional Social History:                         Sleep: Good  Appetite:  Good  Current Medications: Current Facility-Administered Medications  Medication Dose Route Frequency Provider Last Rate Last Dose  . alum & mag hydroxide-simeth (MAALOX/MYLANTA) 200-200-20 MG/5ML suspension 30 mL  30 mL Oral Q4H PRN Rankin, Shuvon B,  NP   30 mL at 10/27/16 2129  . ARIPiprazole (ABILIFY) tablet 30 mg  30 mg Oral Q supper Kathlee Nations, MD   30 mg at 10/28/16 1209  . benztropine (COGENTIN) tablet 1 mg  1 mg Oral QHS Arfeen, Arlyce Harman, MD   1 mg at 10/27/16 2117  . cholecalciferol (VITAMIN D) tablet 1,000 Units  1,000 Units Oral Daily Maris Berger T, MD      . docusate sodium (COLACE) capsule 100 mg  100 mg Oral Daily PRN Derrill Center, NP   100 mg at 10/28/16 0820  . gabapentin (NEURONTIN) capsule 100 mg  100 mg Oral TID Pennelope Bracken, MD   100 mg at 10/28/16 1215  . haloperidol (HALDOL) tablet 5 mg  5 mg Oral Q6H PRN  Arfeen, Arlyce Harman, MD       Or  . haloperidol lactate (HALDOL) injection 5 mg  5 mg Intramuscular Q6H PRN Arfeen, Arlyce Harman, MD      . hydrOXYzine (ATARAX/VISTARIL) tablet 25 mg  25 mg Oral TID PRN Rankin, Shuvon B, NP   25 mg at 10/27/16 2117  . ibuprofen (ADVIL,MOTRIN) tablet 600 mg  600 mg Oral Q6H PRN Derrill Center, NP   600 mg at 10/28/16 1209  . insulin aspart (novoLOG) injection 0-15 Units  0-15 Units Subcutaneous TID WC Rankin, Shuvon B, NP   2 Units at 10/24/16 1649  . LORazepam (ATIVAN) tablet 1 mg  1 mg Oral Q6H PRN Kathlee Nations, MD   1 mg at 10/28/16 0420   Or  . LORazepam (ATIVAN) injection 1 mg  1 mg Intramuscular Q6H PRN Arfeen, Arlyce Harman, MD      . magnesium hydroxide (MILK OF MAGNESIA) suspension 30 mL  30 mL Oral Daily PRN Rankin, Shuvon B, NP      . metFORMIN (GLUCOPHAGE) tablet 500 mg  500 mg Oral Q breakfast Rankin, Shuvon B, NP   500 mg at 10/25/16 0830  . multivitamin with minerals tablet 1 tablet  1 tablet Oral Daily Pennelope Bracken, MD   1 tablet at 10/28/16 1209  . ondansetron (ZOFRAN) tablet 4 mg  4 mg Oral Q8H PRN Rankin, Shuvon B, NP      . oxybutynin (DITROPAN-XL) 24 hr tablet 10 mg  10 mg Oral QHS Rankin, Shuvon B, NP   10 mg at 10/27/16 2117  . temazepam (RESTORIL) capsule 15 mg  15 mg Oral QHS Rankin, Shuvon B, NP   15 mg at 10/27/16 2117    Lab Results:  Results for orders placed or performed during the hospital encounter of 10/23/16 (from the past 48 hour(s))  Glucose, capillary     Status: Abnormal   Collection Time: 10/26/16  4:47 PM  Result Value Ref Range   Glucose-Capillary 149 (H) 65 - 99 mg/dL  Glucose, capillary     Status: Abnormal   Collection Time: 10/26/16  8:40 PM  Result Value Ref Range   Glucose-Capillary 110 (H) 65 - 99 mg/dL  Glucose, capillary     Status: None   Collection Time: 10/27/16 11:56 AM  Result Value Ref Range   Glucose-Capillary 83 65 - 99 mg/dL   Comment 1 Notify RN    Comment 2 Document in Chart   Glucose,  capillary     Status: None   Collection Time: 10/27/16  5:08 PM  Result Value Ref Range   Glucose-Capillary 87 65 - 99 mg/dL   Comment 1 Notify RN  Comment 2 Document in Chart   Glucose, capillary     Status: Abnormal   Collection Time: 10/27/16 11:03 PM  Result Value Ref Range   Glucose-Capillary 128 (H) 65 - 99 mg/dL   Comment 1 Notify RN    Comment 2 Document in Chart   Glucose, capillary     Status: Abnormal   Collection Time: 10/28/16 12:13 PM  Result Value Ref Range   Glucose-Capillary 139 (H) 65 - 99 mg/dL    Blood Alcohol level:  Lab Results  Component Value Date   ETH <10 10/21/2016   ETH <5 94/70/9628    Metabolic Disorder Labs: Lab Results  Component Value Date   HGBA1C 5.6 07/25/2015   MPG 114 07/25/2015   MPG 137 02/01/2015   Lab Results  Component Value Date   PROLACTIN 46.5 (H) 07/25/2015   PROLACTIN 21.6 02/01/2015   Lab Results  Component Value Date   CHOL 135 07/25/2015   TRIG 59 07/25/2015   HDL 50 07/25/2015   CHOLHDL 2.7 07/25/2015   VLDL 12 07/25/2015   LDLCALC 73 07/25/2015   LDLCALC 78 02/01/2015    Physical Findings: AIMS: Facial and Oral Movements Muscles of Facial Expression: None, normal Lips and Perioral Area: None, normal Jaw: None, normal Tongue: None, normal,Extremity Movements Upper (arms, wrists, hands, fingers): None, normal Lower (legs, knees, ankles, toes): None, normal, Trunk Movements Neck, shoulders, hips: None, normal, Overall Severity Severity of abnormal movements (highest score from questions above): None, normal Incapacitation due to abnormal movements: None, normal Patient's awareness of abnormal movements (rate only patient's report): No Awareness, Dental Status Current problems with teeth and/or dentures?: No Does patient usually wear dentures?: No  CIWA:    COWS:     Musculoskeletal: Strength & Muscle Tone: within normal limits Gait & Station: normal Patient leans: N/A  Psychiatric Specialty  Exam: Physical Exam  Review of Systems  Constitutional: Negative for chills and fever.  Respiratory: Negative for cough.   Cardiovascular: Negative for chest pain.  Gastrointestinal: Negative for nausea and vomiting.  Musculoskeletal: Negative for myalgias.  Neurological: Positive for tingling.       Pt reports some tingling in her feet  Psychiatric/Behavioral: Positive for hallucinations. Negative for suicidal ideas. The patient is nervous/anxious.     Blood pressure 128/72, pulse (!) 131, temperature 98.3 F (36.8 C), resp. rate 20, height 5\' 3"  (1.6 m), weight 72.1 kg (159 lb), SpO2 100 %.Body mass index is 28.17 kg/m.  General Appearance: Disheveled and Guarded  Eye Contact:  Poor  Speech:  Slow   Volume:  Normal  Mood:  Anxious, Depressed and Irritable  Affect:  Congruent, Constricted, Flat and Labile  Thought Process:  Disorganized and Descriptions of Associations: Loose  Orientation:  Full (Time, Place, and Person)  Thought Content:  Delusions, Hallucinations: Auditory, Obsessions, Tangential and Thought blocking  Suicidal Thoughts:  No  Homicidal Thoughts:  No  Memory:  Immediate;   Fair Recent;   Fair Remote;   Fair  Judgement:  Impaired  Insight:  Lacking  Psychomotor Activity:  Normal  Concentration:  Concentration: Poor and Attention Span: Poor  Recall:  [employer] of Knowledge:  Fair  Language:  Fair  Akathisia:  No  Handed:  Right  AIMS (if indicated):     Assets:  Housing  ADL's:  Intact  Cognition:  Impaired,  Moderate  Sleep:  Number of Hours: 0.75, pt reports sleeping adequately     Treatment Plan Summary: Daily contact with patient  to assess and evaluate symptoms and progress in treatment  Patient continues to display significant disorganization, paranoia, and thought blocking. She has not been taking full doses of medications, but agrees to take regimen described below. She has been attending groups.  -Continue abilify 30mg  qDay (anticipate change  to [facility]) - Continue cogentin 1mg  qhs - Continue atarax 25mg  TID PRN anxiety - Continue temazepam 15mg  qhs - Start Gabapentin 100mg  TID - Continue metformin 500mg  qDay - Discontinue sliding scale insulin as pt is refusing (will continue to monitor CBG's regularly)  Encouraged to participate in group therapy.  Continue when necessary medication for agitation. Discharge planning will be ongoing.   Pennelope Bracken, MD 10/28/2016, 3:52 PM

## 2016-10-28 NOTE — Progress Notes (Signed)
D: Pt denies SI/HI/AVH. Pt is pleasant and cooperative. Pt appears to be responding though is pleasant this evening.   A: Pt was offered support and encouragement. Pt was given scheduled medications. Pt was encourage to attend groups. Q 15 minute checks were done for safety.    R: Pt is taking medication. Pt has no complaints.Pt receptive to treatment and safety maintained on unit.

## 2016-10-28 NOTE — [facility] Group Notes (Signed)
LCSW Group Therapy Note  10/28/2016 1:15pm  Type of Therapy/Topic:  Group Therapy:  Feelings about Diagnosis  Participation Level:  Did Not Attend   Description of Group:   This group will allow patients to explore their thoughts and feelings about diagnoses they have received. Patients will be guided to explore their level of understanding and acceptance of these diagnoses. Facilitator will encourage patients to process their thoughts and feelings about the reactions of others to their diagnosis and will guide patients in identifying ways to discuss their diagnosis with significant others in their lives. This group will be process-oriented, with patients participating in exploration of their own experiences, giving and receiving support, and processing challenge from other group members.   Therapeutic Goals: 1. Patient will demonstrate understanding of diagnosis as evidenced by identifying two or more symptoms of the disorder 2. Patient will be able to express two feelings regarding the diagnosis 3. Patient will demonstrate their ability to communicate their needs through discussion and/or role play  Summary of Patient Progress:       Therapeutic Modalities:   Cognitive Behavioral Therapy Brief Therapy Feelings Identification    [name], [place] 10/28/2016 2:31 PM

## 2016-10-28 NOTE — [facility] Group Notes (Signed)
LCSW Group Therapy Note   10/28/2016 1:15pm   Type of Therapy and Topic:  Group Therapy:  Overcoming Obstacles   Participation Level:  Did Not Attend   Description of Group:    In this group patients will be encouraged to explore what they see as obstacles to their own wellness and recovery. They will be guided to discuss their thoughts, feelings, and behaviors related to these obstacles. The group will process together ways to cope with barriers, with attention given to specific choices patients can make. Each patient will be challenged to identify changes they are motivated to make in order to overcome their obstacles. This group will be process-oriented, with patients participating in exploration of their own experiences as well as giving and receiving support and challenge from other group members.   Therapeutic Goals: 1. Patient will identify personal and current obstacles as they relate to admission. 2. Patient will identify barriers that currently interfere with their wellness or overcoming obstacles.  3. Patient will identify feelings, thought process and behaviors related to these barriers. 4. Patient will identify two changes they are willing to make to overcome these obstacles:      Summary of Patient Progress   Late entry for 10/27/16   Therapeutic Modalities:   Cognitive Behavioral Therapy Solution Focused Therapy Motivational Interviewing Relapse Prevention Therapy  [name], LCSW 10/28/2016 2:31 PM

## 2016-10-28 NOTE — Progress Notes (Signed)
Recreation Therapy Notes  Date: 10/28/16 Time: 1000 Location: 500 Hall Dayroom  Group Topic: Decision Making, Communication  Goal Area(s) Addresses:  Patient will identify factors that guided their decision making.  Patient will identify benefit of healthy decision making post d/c.   Intervention:  Choices in a Jar Game  Activity: Choices in a Jar.  LRT read a card from the jar.  Each card gave the patients two options.  Patients would have to tell with scenario they chose and why.   Education: Education officer, community, Environmental health practitioner, Discharge Planning   Education Outcome: Acknowledges education  Clinical Observations/Feedback: Pt did not attend group.   Victorino Sparrow, LRT/CTRS         Victorino Sparrow A 10/28/2016 12:16 PM

## 2016-10-29 LAB — GLUCOSE, CAPILLARY
GLUCOSE-CAPILLARY: 98 mg/dL (ref 65–99)
GLUCOSE-CAPILLARY: 138 mg/dL — AB (ref 65–99)
Glucose-Capillary: 112 mg/dL — ABNORMAL HIGH (ref 65–99)

## 2016-10-29 NOTE — Tx Team (Signed)
Interdisciplinary Treatment and Diagnostic Plan Update  10/29/2016 Time of Session: 10:53 AM  [name] [name] MRN: [id number]  Principal Diagnosis: Schizoaffective disorder, Bipolar-type Secondary Diagnoses: Principal Problem:   Schizoaffective disorder, bipolar type ([place])   Current Medications:  Current Facility-Administered Medications  Medication Dose Route Frequency Provider Last Rate Last Dose  . alum & mag hydroxide-simeth (MAALOX/MYLANTA) 200-200-20 MG/5ML suspension 30 mL  30 mL Oral Q4H PRN Rankin, Shuvon B, NP   30 mL at 10/27/16 2129  . ARIPiprazole (ABILIFY) tablet 30 mg  30 mg Oral Daily Pennelope Bracken, MD   30 mg at 10/29/16 0758  . benztropine (COGENTIN) tablet 1 mg  1 mg Oral QHS Arfeen, Arlyce Harman, MD   1 mg at 10/28/16 2210  . cholecalciferol (VITAMIN D) tablet 1,000 Units  1,000 Units Oral Daily Pennelope Bracken, MD   1,000 Units at 10/29/16 0758  . docusate sodium (COLACE) capsule 100 mg  100 mg Oral Daily PRN Derrill Center, NP   100 mg at 10/28/16 0820  . haloperidol (HALDOL) tablet 5 mg  5 mg Oral Q6H PRN Arfeen, Arlyce Harman, MD       Or  . haloperidol lactate (HALDOL) injection 5 mg  5 mg Intramuscular Q6H PRN Arfeen, Arlyce Harman, MD      . hydrOXYzine (ATARAX/VISTARIL) tablet 50 mg  50 mg Oral TID PRN Patriciaann Clan E, PA-C   50 mg at 10/28/16 2211  . ibuprofen (ADVIL,MOTRIN) tablet 600 mg  600 mg Oral Q6H PRN Derrill Center, NP   600 mg at 10/28/16 1209  . LORazepam (ATIVAN) tablet 1 mg  1 mg Oral Q6H PRN Kathlee Nations, MD   1 mg at 10/28/16 0420   Or  . LORazepam (ATIVAN) injection 1 mg  1 mg Intramuscular Q6H PRN Arfeen, Arlyce Harman, MD      . magnesium hydroxide (MILK OF MAGNESIA) suspension 30 mL  30 mL Oral Daily PRN Rankin, Shuvon B, NP      . metFORMIN (GLUCOPHAGE) tablet 500 mg  500 mg Oral Q breakfast Rankin, Shuvon B, NP   500 mg at 10/29/16 0758  . multivitamin with minerals tablet 1 tablet  1 tablet Oral Daily Pennelope Bracken, MD   1 tablet  at 10/29/16 0758  . ondansetron (ZOFRAN) tablet 4 mg  4 mg Oral Q8H PRN Rankin, Shuvon B, NP      . oxybutynin (DITROPAN-XL) 24 hr tablet 10 mg  10 mg Oral QHS Rankin, Shuvon B, NP   10 mg at 10/28/16 2211  . temazepam (RESTORIL) capsule 15 mg  15 mg Oral QHS Rankin, Shuvon B, NP   15 mg at 10/28/16 2211    PTA Medications: Prescriptions Prior to Admission  Medication Sig Dispense Refill Last Dose  . divalproex (DEPAKOTE) 500 MG DR tablet Take 500 mg by mouth 2 (two) times daily.   Past Week at Unknown time  . ibuprofen (ADVIL,MOTRIN) 200 MG tablet Take 800 mg by mouth every 6 (six) hours as needed for moderate pain.   unknown  . Multiple Vitamin (MULTIVITAMIN WITH MINERALS) TABS tablet Take 1 tablet by mouth daily.   Past Week at Unknown time  . RISPERIDONE PO Take by mouth.   Past Week at Unknown time    Treatment Modalities: Medication Management, Group therapy, Case management,  1 to 1 session with clinician, Psychoeducation, Recreational therapy.  Patient Stressors: Other: Psychosis  Patient Strengths: Average or above average intelligence Physical Health Religious Affiliation  Physician Treatment Plan for Primary Diagnosis: Schizoaffective disorder, Bipolar-type Long Term Goal(s): Improvement in symptoms so as ready for discharge  Short Term Goals: Ability to verbalize feelings will improve Compliance with prescribed medications will improve Ability to identify and develop effective coping behaviors will improve Compliance with prescribed medications will improve  Medication Management: Evaluate patient's response, side effects, and tolerance of medication regimen.  Therapeutic Interventions: 1 to 1 sessions, Unit Group sessions and Medication administration.  Evaluation of Outcomes: Progressing   10/24: Patient continues to display significant disorganization, paranoia, and thought blocking. She has not been taking full doses of medications, but agrees to take regimen  described below. She has been attending groups.  -Continue abilify 30mg  qDay (anticipate change to LAI Maintena) - Continue cogentin 1mg  qhs - Continue atarax 25mg  TID PRN anxiety - Continue temazepam 15mg  qhs - Start Gabapentin 100mg  TID - Continue metformin 500mg  qDay - Discontinue sliding scale insulin as pt is refusing (will continue to monitor CBG's regularly)   Physician Treatment Plan for Secondary Diagnosis: Principal Problem:   Schizoaffective disorder, bipolar type ([place])  Long Term Goal(s): Improvement in symptoms so as ready for discharge  Short Term Goals: Ability to verbalize feelings will improve Compliance with prescribed medications will improve Ability to identify and develop effective coping behaviors will improve Compliance with prescribed medications will improve  Medication Management: Evaluate patient's response, side effects, and tolerance of medication regimen.  Therapeutic Interventions: 1 to 1 sessions, Unit Group sessions and Medication administration.  Evaluation of Outcomes: Progressing   RN Treatment Plan for Primary Diagnosis: Schizoaffective disorder, Bipolar-type Long Term Goal(s): Knowledge of disease and therapeutic regimen to maintain health will improve  Short Term Goals: Ability to participate in decision making will improve, Ability to verbalize feelings will improve, Ability to identify and develop effective coping behaviors will improve and Compliance with prescribed medications will improve  Medication Management: RN will administer medications as ordered by provider, will assess and evaluate patient's response and provide education to patient for prescribed medication. RN will report any adverse and/or side effects to prescribing provider.  Therapeutic Interventions: 1 on 1 counseling sessions, Psychoeducation, Medication administration, Evaluate responses to treatment, Monitor vital signs and CBGs as ordered, Perform/monitor CIWA, COWS,  AIMS and Fall Risk screenings as ordered, Perform wound care treatments as ordered.  Evaluation of Outcomes: Progressing   LCSW Treatment Plan for Primary Diagnosis: Schizoaffective disorder, Bipolar-type Long Term Goal(s): Safe transition to appropriate next level of care at discharge, Engage patient in therapeutic group addressing interpersonal concerns.  Short Term Goals: Engage patient in aftercare planning with referrals and resources, Increase social support, Increase ability to appropriately verbalize feelings, Facilitate acceptance of mental health diagnosis and concerns, Identify triggers associated with mental health/substance abuse issues and Increase skills for wellness and recovery  Therapeutic Interventions: Assess for all discharge needs, 1 to 1 time with Social worker, Explore available resources and support systems, Assess for adequacy in community support network, Educate family and significant other(s) on suicide prevention, Complete Psychosocial Assessment, Interpersonal group therapy.  Evaluation of Outcomes: Progressing   Progress in Treatment: Attending groups: No Participating in groups: No Taking medication as prescribed: Yes Toleration of medication: Yes, no side effects reported at this time Family/Significant other contact made: [place] [phone] Patient understands diagnosis: No, limited insight  Discussing patient identified problems/goals with staff: Yes Medical problems stabilized or resolved: Yes Denies suicidal/homicidal ideation: Yes Issues/concerns per patient self-inventory: None Other: N/A  New problem(s) identified: None identified  at this time.   New Short Term/Long Term Goal(s): Pt would like help with "Getting back on medications", as reported by [facility].   Discharge Plan or Barriers: Upon discharge pt will return to her room at the home of [facility]   Reason for Continuation of  Hospitalization:  Medication stabilization   Estimated Length of Stay: 10/29/16  Attendees: Patient:  10/29/2016  10:53 AM  Physician: Maris Berger, MD 10/29/2016  10:53 AM  Nursing: Jonette Mate, RN 10/29/2016  10:53 AM  RN Care Manager: Lars Pinks, RN 10/29/2016  10:53 AM  Social Worker: Ripley Fraise, LCSW; Verdis Frederickson, Social Work Intern 10/29/2016  10:53 AM  Recreational Therapist: Victorino Sparrow, [place] 10/29/2016  10:53 AM  Other: Norberto Sorenson, [place] 10/29/2016  10:53 AM  Other:  10/29/2016  10:53 AM  Other: 10/29/2016  10:53 AM    Scribe for Treatment Team: [name], LCSW 10/29/2016 10:53 AM

## 2016-10-29 NOTE — Progress Notes (Signed)
Patient has been in her room talking loudly to unseen others. Patient's conversation is religiously persecutory in nature and she believes that her sins are being exposed by "unseen angels"  Who speak to her.  Patient has been compliant with medications but has not attended groups.    Assess patient for safety, offer medications as prescribed, engage patient in 1:1 staff talks.   Patient able to contract for safety. Continue to monitor for safety.

## 2016-10-29 NOTE — Progress Notes (Signed)
Patient did not attended group.

## 2016-10-29 NOTE — Plan of Care (Signed)
Problem: Coping: Goal: Ability to demonstrate self-control will improve Outcome: Progressing Pt has been appropriate on the unit this evening

## 2016-10-29 NOTE — [facility] Group Notes (Signed)
LCSW Group Therapy Note  10/29/2016 1:15pm  Type of Therapy/Topic:  Group Therapy:  Balance in Life  Participation Level:  Active  Description of Group:    This group will address the concept of balance and how it feels and looks when one is unbalanced. Patients will be encouraged to process areas in their lives that are out of balance and identify reasons for remaining unbalanced. Facilitators will guide patients in utilizing problem-solving interventions to address and correct the stressor making their life unbalanced. Understanding and applying boundaries will be explored and addressed for obtaining and maintaining a balanced life. Patients will be encouraged to explore ways to assertively make their unbalanced needs known to significant others in their lives, using other group members and facilitator for support and feedback.  Therapeutic Goals: 1. Patient will identify two or more emotions or situations they have that consume much of in their lives. 2. Patient will identify signs/triggers that life has become out of balance:  3. Patient will identify two ways to set boundaries in order to achieve balance in their lives:  4. Patient will demonstrate ability to communicate their needs through discussion and/or role plays  Summary of Patient Pro  [name] [name] attended group and was there the entire time. She states that her sadness never goes away and that she knows she is sad when she rubs her hands together or rubs a spot in her body that is in pain. She also feels that medication can be helpful in helping her to feel happier.  She appeared somewhat distracted and delayed in her thoughts and speech.  Her mood today was neutral.    Therapeutic Modalities:   Cognitive Behavioral Therapy Solution-Focused Therapy Assertiveness Training  [name], [place] Work 10/29/2016 11:42 AM

## 2016-10-29 NOTE — Progress Notes (Signed)
Patient ID: [name] [name], female   DOB: Jul 03, 1980, 36 y.o.   MRN: [id number] [facility] MD Progress Note  10/29/2016 11:12 AM [name] [name]  MRN:  [id number]   Subjective:  "I'm angry."   Patient seen and chart reviewed.  Patient states that she is irritable this morning because she has been preoccupied with thoughts regarding previous traumatic events. She is paranoid regarding being accused of molesting a child. She is also religiously preoccupied, and makes several statements about God and "receiving baptism." She endorses AH of hearing people talk about her. She is guarded in regards to her previous statements about religious themes of hearing/seeing angels. She denies VH today. She denies SI/HI. She is generally disorganized and tangential in her speech. For example, she mentions, "I need a bath. What should know about is the little boy, who is like a prince." Pt has improved adherence to the treatment regimen and she is now taking full dose of abilify. She denies any medical concerns at time of evaluation. She is sleeping well and her appetite is adequate. We discussed planning ahead for her eventual discharge, and pt explained that she had some anxiety about returning to her home in the presence of children. She denied any thoughts about hurting anyone else, and she explained that she did not want her roommate to have the TV on something inappropriate that children in her home might have a chance to overhear/see. Pt was reassured that she could focus on her own behavior making sure she is treating others with respect and appropriately, and pt verbalized good understanding. Pt had mentioned tingling in her feet at yesterday's interview for which gabapentin was started, but pt declined the medication and it was discontinued. Today she denies any tingling and she denies other physical complaints.She had no further questions, comments, or concerns.    Principal Problem: Schizoaffective disorder, bipolar  type ([place]) Diagnosis:   Patient Active Problem List   Diagnosis Date Noted  . Insomnia [G47.00]   . Anxiety state [F41.1]   . Overactive bladder [N32.81]   . Diabetes mellitus ([place]) [E11.9] 02/08/2015  . Schizoaffective disorder, bipolar type ([place]) [F25.0] 01/28/2015  . Non compliance w medication regimen [Z91.14]    Total Time spent with patient: 30 minutes  Past Psychiatric History: Reviewed.  Past Medical History:  Past Medical History:  Diagnosis Date  . Bipolar affective disorder, currently manic, mild ([place])   . Diabetes mellitus without complication ([place])   . Schizophrenia [facility])     Past Surgical History:  Procedure Laterality Date  . WISDOM TOOTH EXTRACTION     Family History:  Family History  Problem Relation Age of Onset  . Drug abuse Maternal Uncle    Family Psychiatric  History: Reviewed. Social History:  History  Alcohol Use No     History  Drug Use No    Social History   Social History  . Marital status: Legally Separated    Spouse name: N/A  . Number of children: N/A  . Years of education: N/A   Social History Main Topics  . Smoking status: Never Smoker  . Smokeless tobacco: Never Used  . Alcohol use No  . Drug use: No  . Sexual activity: Not Currently   Other Topics Concern  . None   Social History Narrative  . None   Additional Social History:                         Sleep: Good  Appetite:  Good  Current Medications: Current Facility-Administered Medications  Medication Dose Route Frequency Provider Last Rate Last Dose  . alum & mag hydroxide-simeth (MAALOX/MYLANTA) 200-200-20 MG/5ML suspension 30 mL  30 mL Oral Q4H PRN Rankin, Shuvon B, NP   30 mL at 10/27/16 2129  . ARIPiprazole (ABILIFY) tablet 30 mg  30 mg Oral Daily Pennelope Bracken, MD   30 mg at 10/29/16 0758  . benztropine (COGENTIN) tablet 1 mg  1 mg Oral QHS Arfeen, Arlyce Harman, MD   1 mg at 10/28/16 2210  . cholecalciferol (VITAMIN D) tablet 1,000 Units   1,000 Units Oral Daily Pennelope Bracken, MD   1,000 Units at 10/29/16 0758  . docusate sodium (COLACE) capsule 100 mg  100 mg Oral Daily PRN Derrill Center, NP   100 mg at 10/28/16 0820  . haloperidol (HALDOL) tablet 5 mg  5 mg Oral Q6H PRN Arfeen, Arlyce Harman, MD       Or  . haloperidol lactate (HALDOL) injection 5 mg  5 mg Intramuscular Q6H PRN Arfeen, Arlyce Harman, MD      . hydrOXYzine (ATARAX/VISTARIL) tablet 50 mg  50 mg Oral TID PRN Patriciaann Clan E, PA-C   50 mg at 10/28/16 2211  . ibuprofen (ADVIL,MOTRIN) tablet 600 mg  600 mg Oral Q6H PRN Derrill Center, NP   600 mg at 10/28/16 1209  . LORazepam (ATIVAN) tablet 1 mg  1 mg Oral Q6H PRN Kathlee Nations, MD   1 mg at 10/28/16 0420   Or  . LORazepam (ATIVAN) injection 1 mg  1 mg Intramuscular Q6H PRN Arfeen, Arlyce Harman, MD      . magnesium hydroxide (MILK OF MAGNESIA) suspension 30 mL  30 mL Oral Daily PRN Rankin, Shuvon B, NP      . metFORMIN (GLUCOPHAGE) tablet 500 mg  500 mg Oral Q breakfast Rankin, Shuvon B, NP   500 mg at 10/29/16 0758  . multivitamin with minerals tablet 1 tablet  1 tablet Oral Daily Pennelope Bracken, MD   1 tablet at 10/29/16 0758  . ondansetron (ZOFRAN) tablet 4 mg  4 mg Oral Q8H PRN Rankin, Shuvon B, NP      . oxybutynin (DITROPAN-XL) 24 hr tablet 10 mg  10 mg Oral QHS Rankin, Shuvon B, NP   10 mg at 10/28/16 2211  . temazepam (RESTORIL) capsule 15 mg  15 mg Oral QHS Rankin, Shuvon B, NP   15 mg at 10/28/16 2211    Lab Results:  Results for orders placed or performed during the hospital encounter of 10/23/16 (from the past 48 hour(s))  Glucose, capillary     Status: None   Collection Time: 10/27/16 11:56 AM  Result Value Ref Range   Glucose-Capillary 83 65 - 99 mg/dL   Comment 1 Notify RN    Comment 2 Document in Chart   Glucose, capillary     Status: None   Collection Time: 10/27/16  5:08 PM  Result Value Ref Range   Glucose-Capillary 87 65 - 99 mg/dL   Comment 1 Notify RN    Comment 2 Document in  Chart   Glucose, capillary     Status: Abnormal   Collection Time: 10/27/16 11:03 PM  Result Value Ref Range   Glucose-Capillary 128 (H) 65 - 99 mg/dL   Comment 1 Notify RN    Comment 2 Document in Chart   Glucose, capillary     Status: Abnormal   Collection Time: 10/28/16 12:13  PM  Result Value Ref Range   Glucose-Capillary 139 (H) 65 - 99 mg/dL  Glucose, capillary     Status: None   Collection Time: 10/28/16  4:53 PM  Result Value Ref Range   Glucose-Capillary 71 65 - 99 mg/dL   Comment 1 Notify RN    Comment 2 Document in Chart     Blood Alcohol level:  Lab Results  Component Value Date   ETH <10 10/21/2016   ETH <5 74/08/1446    Metabolic Disorder Labs: Lab Results  Component Value Date   HGBA1C 5.6 07/25/2015   MPG 114 07/25/2015   MPG 137 02/01/2015   Lab Results  Component Value Date   PROLACTIN 46.5 (H) 07/25/2015   PROLACTIN 21.6 02/01/2015   Lab Results  Component Value Date   CHOL 135 07/25/2015   TRIG 59 07/25/2015   HDL 50 07/25/2015   CHOLHDL 2.7 07/25/2015   VLDL 12 07/25/2015   LDLCALC 73 07/25/2015   LDLCALC 78 02/01/2015    Physical Findings: AIMS: Facial and Oral Movements Muscles of Facial Expression: None, normal Lips and Perioral Area: None, normal Jaw: None, normal Tongue: None, normal,Extremity Movements Upper (arms, wrists, hands, fingers): None, normal Lower (legs, knees, ankles, toes): None, normal, Trunk Movements Neck, shoulders, hips: None, normal, Overall Severity Severity of abnormal movements (highest score from questions above): None, normal Incapacitation due to abnormal movements: None, normal Patient's awareness of abnormal movements (rate only patient's report): No Awareness, Dental Status Current problems with teeth and/or dentures?: No Does patient usually wear dentures?: No  CIWA:    COWS:     Musculoskeletal: Strength & Muscle Tone: within normal limits Gait & Station: normal Patient leans:  N/A  Psychiatric Specialty Exam: Physical Exam  Review of Systems  Constitutional: Negative for chills and fever.  Respiratory: Negative for cough and shortness of breath.   Cardiovascular: Negative for chest pain.  Gastrointestinal: Negative for nausea and vomiting.  Neurological: Negative for tingling.    Blood pressure 130/86, pulse (!) 116, temperature 98.3 F (36.8 C), temperature source Oral, resp. rate 18, height 5\' 3"  (1.6 m), weight 72.1 kg (159 lb), SpO2 100 %.Body mass index is 28.17 kg/m.  General Appearance: Fairly Groomed and Guarded  Eye Contact:  Fair  Speech:  Slow   Volume:  Normal  Mood:  Anxious, Depressed and Irritable  Affect:  Congruent, Constricted, Flat and Labile  Thought Process:  Disorganized and Descriptions of Associations: Tangential  Orientation:  Full (Time, Place, and Person)  Thought Content:  Delusions, Hallucinations: Auditory, Obsessions, Tangential and Thought blocking, religiously themed delusions  Suicidal Thoughts:  No  Homicidal Thoughts:  No  Memory:  Immediate;   Fair Recent;   Fair Remote;   Fair  Judgement:  Impaired  Insight:  Lacking  Psychomotor Activity:  Normal  Concentration:  Concentration: Poor and Attention Span: Poor  Recall:  [employer] of Knowledge:  Fair  Language:  Fair  Akathisia:  No  Handed:  Right  AIMS (if indicated):     Assets:  Housing  ADL's:  Intact  Cognition:  Impaired,  Moderate  Sleep:  Number of Hours: 3.5, pt reports adequate sleep     Treatment Plan Summary: Daily contact with patient to assess and evaluate symptoms and progress in treatment  Patient continues to display significant delusions, preoccupation with internal stimuli, paranoia, and thought blocking. She has been compliant with abilify since interview yesterday. Pt was encouraged to participate in groups.  -Continue  abilify 30mg  qDay (anticipate change to LAI Maintena) for psychosis - Continue cogentin 1mg  qhs for EPS - Continue  atarax 25mg  TID PRN anxiety - Continue temazepam 15mg  qhs for insomnia - Discontinued Gabapentin 100mg  TID (pt declined) - Continue metformin 500mg  qDay for diabetes - Continue to monitor CBG's regularly - pt had been refusing insulin which was discontinued.  -Encouraged to participate in group therapy.  - Discharge planning will be ongoing.   Pennelope Bracken, MD 10/29/2016, 11:12 AM

## 2016-10-29 NOTE — Progress Notes (Addendum)
D: Pt religiously preoccupied , pt continues to keep to herself. Pt appears to be responding at times, but pt has been pleasant on the unit this evening. Pt did not endorse somatic complaints tonight  A: Pt was offered support and encouragement. Pt was given scheduled medications. Pt was encourage to attend groups. Q 15 minute checks were done for safety.   R:Pt is taking medication. Pt has no complaints.Pt receptive to treatment and safety maintained on unit.

## 2016-10-29 NOTE — Progress Notes (Signed)
Recreation Therapy Notes  Date: 10/29/16 Time: 1000 Location: 500 Hall Dayroom  Group Topic: Stress Management  Goal Area(s) Addresses:  Patient will verbalize importance of using healthy stress management.  Patient will identify positive emotions associated with healthy stress management.   Intervention: Architect paper, markers  Activity :  Personalized Plates.  Patients were to create a personalized plate where they were to highlight the positive things about themselves.  Patients could highlight things such as biggest accomplishment, important dates, favorite foods, etc.  Education:  Stress Management, Discharge Planning.   Education Outcome: Acknowledges edcuation/In group clarification offered/Needs additional education  Clinical Observations/Feedback:  Pt did not attend group.   Victorino Sparrow, LRT/CTRS         Victorino Sparrow A 10/29/2016 12:34 PM

## 2016-10-30 LAB — GLUCOSE, CAPILLARY
GLUCOSE-CAPILLARY: 110 mg/dL — AB (ref 65–99)
GLUCOSE-CAPILLARY: 109 mg/dL — AB (ref 65–99)
Glucose-Capillary: 83 mg/dL (ref 65–99)

## 2016-10-30 MED ORDER — GABAPENTIN 100 MG PO CAPS
100.0000 mg | ORAL_CAPSULE | Freq: Once | ORAL | Status: AC
  Administered 2016-10-30: 100 mg via ORAL
  Filled 2016-10-30 (×2): qty 1

## 2016-10-30 MED ORDER — TEMAZEPAM 7.5 MG PO CAPS
7.5000 mg | ORAL_CAPSULE | Freq: Every day | ORAL | Status: DC
  Administered 2016-10-30: 7.5 mg via ORAL
  Filled 2016-10-30: qty 1

## 2016-10-30 NOTE — B[facility] Group Notes (Signed)
LCSW Group Therapy 10/30/2016 1:15pm  Type of Therapy and Topic:  Group Therapy:  Change and Accountability  Participation Level:  Minimal  Description of Group In this group, patients discussed power and accountability for change.  The group identified the challenges related to accountability and the difficulty of accepting the outcomes of negative behaviors.  Patients were encouraged to openly discuss a challenge/change they could take responsibility for.  Patients discussed the use of "change talk" and positive thinking as ways to support achievement of personal goals.  The group discussed ways to give support and empowerment to peers.  Therapeutic Goals: 1. Patients will state the relationship between personal power and accountability in the change process 2. Patients will identify the positive and negative consequences of a personal choice they have made 3. Patients will identify one challenge/choice they will take responsibility for making 4. Patients will discuss the role of "change talk" and the impact of positive thinking as it supports successful personal change 5. Patients will verbalize support and affirmation of change efforts in peers  Summary of Patient Progress:  [place] attended group but came into the room late. She shared that she feels the doctors are always right because they are trying to protect the patients and their staff.  She also shared that she loves the lord and that her faith has helped her to see that the doctors are doing their best.  She appeared to have no symptoms of psychosis.    Therapeutic Modalities Solution Focused Brief Therapy Motivational Interviewing Cognitive Behavioral Therapy  Riley Lam Work 10/30/2016 1:07 PM

## 2016-10-30 NOTE — Progress Notes (Signed)
Patient ID: [name] [name], female   DOB: [birth year], 36 y.o.   MRN: [id number] [facility] MD Progress Note  10/30/2016 12:39 PM Tephanie Escorcia  MRN:  [id number]   Subjective:  "I didn't sleep, but I'm good."   Patient seen and chart reviewed.  Patient states that she is doing well overall this morning in regards to her mood. She has been reading the Bible. As per RN staff, pt did not sleep at all overnight and she remained internally preoccupied, religiously perseverative, and responding to stimuli. Pt endorses AH today, stating, "I hear them all the time, but I know it's not normal." She denies VH/SI/HI. Pt notes that overall she feels she is improved since being at [facility], stating, "I feel better - I can read my Bible again." Pt indicates that some of her AH have a command component, stating, "I sometimes refuse my medication because I have to obey." She denies any commands to hurt herself or others.  She has been compliant with her medications. She notes physical complaint of tingling in her feet and she requests for gabapentin to address the problem; pt had made similar request 2 days ago but then declined gabapentin when offered, but today she confirms that she would like to try gabapentin. She also refused restoril because she feels it makes her too sleepy, and we discussed reducing the dose so that she could get some sleep but not be overly sedated and pt was in agreement. We discussed potential of discharing tomorrow if she felt ongoing improvement of her symptoms and sleeps well overnight, and pt indicated that she anticipates being ready tomorrow. She had no further questions, comments, or concerns.    Principal Problem: Schizoaffective disorder, bipolar type ([place]) Diagnosis:   Patient Active Problem List   Diagnosis Date Noted  . Insomnia [G47.00]   . Anxiety state [F41.1]   . Overactive bladder [N32.81]   . Diabetes mellitus ([place]) [E11.9] 02/08/2015  . Schizoaffective disorder, bipolar type ([place])  [F25.0] 01/28/2015  . Non compliance w medication regimen [Z91.14]    Total Time spent with patient: 30 minutes  Past Psychiatric History: Reviewed.  Past Medical History:  Past Medical History:  Diagnosis Date  . Bipolar affective disorder, currently manic, mild ([place])   . Diabetes mellitus without complication ([place])   . Schizophrenia [facility])     Past Surgical History:  Procedure Laterality Date  . WISDOM TOOTH EXTRACTION     Family History:  Family History  Problem Relation Age of Onset  . Drug abuse Maternal Uncle    Family Psychiatric  History: Reviewed. Social History:  History  Alcohol Use No     History  Drug Use No    Social History   Social History  . Marital status: Legally Separated    Spouse name: N/A  . Number of children: N/A  . Years of education: N/A   Social History Main Topics  . Smoking status: Never Smoker  . Smokeless tobacco: Never Used  . Alcohol use No  . Drug use: No  . Sexual activity: Not Currently   Other Topics Concern  . None   Social History Narrative  . None   Additional Social History:                         Sleep: Poor  Appetite:  Good  Current Medications: Current Facility-Administered Medications  Medication Dose Route Frequency Provider Last Rate Last Dose  . alum & mag hydroxide-simeth (  MAALOX/MYLANTA) 200-200-20 MG/5ML suspension 30 mL  30 mL Oral Q4H PRN Rankin, Shuvon B, NP   30 mL at 10/27/16 2129  . ARIPiprazole (ABILIFY) tablet 30 mg  30 mg Oral Daily Pennelope Bracken, MD   30 mg at 10/30/16 0086  . benztropine (COGENTIN) tablet 1 mg  1 mg Oral QHS Arfeen, Arlyce Harman, MD   1 mg at 10/29/16 2239  . cholecalciferol (VITAMIN D) tablet 1,000 Units  1,000 Units Oral Daily Pennelope Bracken, MD   1,000 Units at 10/30/16 [phone]  . docusate sodium (COLACE) capsule 100 mg  100 mg Oral Daily PRN Derrill Center, NP   100 mg at 10/29/16 1443  . gabapentin (NEURONTIN) capsule 100 mg  100 mg Oral Once  Pennelope Bracken, MD      . haloperidol (HALDOL) tablet 5 mg  5 mg Oral Q6H PRN Arfeen, Arlyce Harman, MD       Or  . haloperidol lactate (HALDOL) injection 5 mg  5 mg Intramuscular Q6H PRN Arfeen, Arlyce Harman, MD      . hydrOXYzine (ATARAX/VISTARIL) tablet 50 mg  50 mg Oral TID PRN Patriciaann Clan E, PA-C   50 mg at 10/28/16 2211  . ibuprofen (ADVIL,MOTRIN) tablet 600 mg  600 mg Oral Q6H PRN Derrill Center, NP   600 mg at 10/28/16 1209  . LORazepam (ATIVAN) tablet 1 mg  1 mg Oral Q6H PRN Kathlee Nations, MD   1 mg at 10/29/16 1405   Or  . LORazepam (ATIVAN) injection 1 mg  1 mg Intramuscular Q6H PRN Arfeen, Arlyce Harman, MD      . magnesium hydroxide (MILK OF MAGNESIA) suspension 30 mL  30 mL Oral Daily PRN Rankin, Shuvon B, NP   30 mL at 10/30/16 0618  . metFORMIN (GLUCOPHAGE) tablet 500 mg  500 mg Oral Q breakfast Rankin, Shuvon B, NP   500 mg at 10/30/16 0831  . multivitamin with minerals tablet 1 tablet  1 tablet Oral Daily Pennelope Bracken, MD   1 tablet at 10/30/16 [phone]  . ondansetron (ZOFRAN) tablet 4 mg  4 mg Oral Q8H PRN Rankin, Shuvon B, NP      . oxybutynin (DITROPAN-XL) 24 hr tablet 10 mg  10 mg Oral QHS Rankin, Shuvon B, NP   10 mg at 10/29/16 2239  . temazepam (RESTORIL) capsule 7.5 mg  7.5 mg Oral QHS Pennelope Bracken, MD        Lab Results:  Results for orders placed or performed during the hospital encounter of 10/23/16 (from the past 48 hour(s))  Glucose, capillary     Status: None   Collection Time: 10/28/16  4:53 PM  Result Value Ref Range   Glucose-Capillary 71 65 - 99 mg/dL   Comment 1 Notify RN    Comment 2 Document in Chart   Glucose, capillary     Status: Abnormal   Collection Time: 10/29/16 11:59 AM  Result Value Ref Range   Glucose-Capillary 112 (H) 65 - 99 mg/dL  Glucose, capillary     Status: None   Collection Time: 10/29/16  5:04 PM  Result Value Ref Range   Glucose-Capillary 98 65 - 99 mg/dL  Glucose, capillary     Status: Abnormal   Collection  Time: 10/29/16  9:23 PM  Result Value Ref Range   Glucose-Capillary 138 (H) 65 - 99 mg/dL  Glucose, capillary     Status: None   Collection Time: 10/30/16 12:01 PM  Result  Value Ref Range   Glucose-Capillary 83 65 - 99 mg/dL   Comment 1 Notify RN    Comment 2 Document in Chart     Blood Alcohol level:  Lab Results  Component Value Date   ETH <10 10/21/2016   ETH <5 83/66/2947    Metabolic Disorder Labs: Lab Results  Component Value Date   HGBA1C 5.6 07/25/2015   MPG 114 07/25/2015   MPG 137 02/01/2015   Lab Results  Component Value Date   PROLACTIN 46.5 (H) 07/25/2015   PROLACTIN 21.6 02/01/2015   Lab Results  Component Value Date   CHOL 135 07/25/2015   TRIG 59 07/25/2015   HDL 50 07/25/2015   CHOLHDL 2.7 07/25/2015   VLDL 12 07/25/2015   LDLCALC 73 07/25/2015   LDLCALC 78 02/01/2015    Physical Findings: AIMS: Facial and Oral Movements Muscles of Facial Expression: None, normal Lips and Perioral Area: None, normal Jaw: None, normal Tongue: None, normal,Extremity Movements Upper (arms, wrists, hands, fingers): None, normal Lower (legs, knees, ankles, toes): None, normal, Trunk Movements Neck, shoulders, hips: None, normal, Overall Severity Severity of abnormal movements (highest score from questions above): None, normal Incapacitation due to abnormal movements: None, normal Patient's awareness of abnormal movements (rate only patient's report): No Awareness, Dental Status Current problems with teeth and/or dentures?: No Does patient usually wear dentures?: No  CIWA:    COWS:     Musculoskeletal: Strength & Muscle Tone: within normal limits Gait & Station: normal Patient leans: N/A  Psychiatric Specialty Exam: Physical Exam  Nursing note and vitals reviewed.   Review of Systems  Constitutional: Negative for chills and fever.  Respiratory: Negative for cough and shortness of breath.   Cardiovascular: Negative for chest pain.  Gastrointestinal:  Negative for abdominal pain, heartburn, nausea and vomiting.  Neurological: Positive for tingling.  Psychiatric/Behavioral: Positive for hallucinations. Negative for depression and suicidal ideas. The patient is nervous/anxious.     Blood pressure 130/86, pulse (!) 116, temperature 98.3 F (36.8 C), temperature source Oral, resp. rate 18, height 5\' 3"  (1.6 m), weight 72.1 kg (159 lb), SpO2 100 %.Body mass index is 28.17 kg/m.  General Appearance: Fairly Groomed and Guarded  Eye Contact:  Fair  Speech:  Clear and Coherent and Normal Rate   Volume:  Normal  Mood:  Anxious and Euthymic  Affect:  Congruent, Constricted, Flat and Labile  Thought Process:  Disorganized and Descriptions of Associations: Loose  Orientation:  Full (Time, Place, and Person)  Thought Content:  Delusions, Hallucinations: Auditory Command:  commands on which medications to take and Thought blocking, religiously themed delusions  Suicidal Thoughts:  No  Homicidal Thoughts:  No  Memory:  Immediate;   Fair Recent;   Fair Remote;   Fair  Judgement:  Impaired  Insight:  Lacking  Psychomotor Activity:  Normal  Concentration:  Concentration: Poor and Attention Span: Poor  Recall:  [employer] of Knowledge:  Fair  Language:  Fair  Akathisia:  No  Handed:  Right  AIMS (if indicated):     Assets:  Housing  ADL's:  Intact  Cognition:  Impaired,  Moderate  Sleep:  Number of Hours: 0, pt reports she feels rested despite no sleep     Treatment Plan Summary: Daily contact with patient to assess and evaluate symptoms and progress in treatment  Patient continues to improve incrementally; however she still displays significant delusions, preoccupation with internal stimuli, paranoia, and thought blocking. She has been compliant with her medications  despite some CAH which tell her which medications to take. She requests gabapentin for foot tingling. She anticipates being ready for DC tomorrow.  -Continue abilify 30mg  qDay  (anticipate change to [facility] M[facility]) for psychosis -Start gabapentin 100mg  po once (now) for foot tingling  - Continue cogentin 1mg  qhs for EPS - Continue atarax 25mg  TID PRN anxiety - Decrease temazepam 15mg  qhs for insomnia to temazepam 7.5mg  qhs - Continue metformin 500mg  qDay for diabetes - Continue to monitor CBG's regularly - pt had been refusing insulin which was discontinued.  -Encouraged to participate in group therapy.  - Discharge planning will be ongoing, anticipate DC to home on 10/31/16.   Pennelope Bracken, MD 10/30/2016, 12:39 PM

## 2016-10-30 NOTE — Progress Notes (Signed)
Recreation Therapy Notes  Date: 10/30/16 Time: 1000 Location: 500 Hall Dayroom  Group Topic: Life Goals, Goal Setting  Goal Area(s) Addresses:  Patient will be able to identify at least 3 life goals.  Patient will be able to identify benefit of investing in life goals.  Patient will be able to identify benefit of setting life goals.   Intervention: Goal Planning Worksheet  Activity: Goal Planning.  Patients were given a worksheet in which they had to come up with a goal for next week, next month, next year and in five years.  Patients were to then identify the obstacles to reaching those goals, what they need to achieve their goals and what they can begin doing to work towards their goals.  Education: Discharge Planning, Coping Skills, Life Goals  Education Outcome: Acknowledges Education/In Group Clarification Provided/Needs Additional Education  Clinical Observations:  Pt did not attend group.   Victorino Sparrow, LRT/CTRS      Ria Comment, Chayton Murata A 10/30/2016 11:45 AM

## 2016-10-30 NOTE — Progress Notes (Signed)
D: Pt is flat and withdrawn to room; remained in bed all evening. Pt made minimal eye contact. Pt at the time of assessment complained of increased anxiety, "I think they want to d/c me tomorrow and I don't think I'm ready to go." Pt also endorsed auditory hallucinations; "I can hear the load talk to me." Pt refused to participate. A: Medications offered as prescribed.  Support, encouragement, and safe environment provided. 15-minute safety checks continue. R: Pt took some of her PM medications and refused the rest; states, "I spoke to God and he told me to only take the ones I took."  Pt did not attend wrap up group. Safety checks continue.

## 2016-10-30 NOTE — Progress Notes (Addendum)
Patient did not attend wrap up group. 

## 2016-10-30 NOTE — Progress Notes (Signed)
Patient has been in her room talking loudly to unseen others. Patient's conversation is religiously persecutory in nature and she believes that her sins are being exposed by "unseen angels"  Who speak to her.  Patient has been compliant with medications but has not attended groups.    Assess patient for safety, offer medications as prescribed, engage patient in 1:1 staff talks.   Patient able to contract for safety. Continue to monitor for safety.

## 2016-10-31 MED ORDER — DOCUSATE SODIUM 100 MG PO CAPS: 100.0000 mg | ORAL_CAPSULE | Freq: Every day | ORAL | 0 refills | Status: DC | PRN

## 2016-10-31 MED ORDER — IBUPROFEN 200 MG PO TABS: 800.0000 mg | ORAL_TABLET | Freq: Four times a day (QID) | ORAL | 0 refills | Status: DC | PRN

## 2016-10-31 MED ORDER — ARIPIPRAZOLE 30 MG PO TABS: 30.0000 mg | ORAL_TABLET | Freq: Every day | ORAL | 0 refills | Status: DC

## 2016-10-31 MED ORDER — ADULT MULTIVITAMIN W/MINERALS CH: 1.0000 | ORAL_TABLET | Freq: Every day | ORAL | Status: DC

## 2016-10-31 MED ORDER — HYDROXYZINE HCL 50 MG PO TABS: 50.0000 mg | ORAL_TABLET | Freq: Three times a day (TID) | ORAL | 0 refills | Status: DC | PRN

## 2016-10-31 MED ORDER — TEMAZEPAM 7.5 MG PO CAPS: 7.5000 mg | ORAL_CAPSULE | Freq: Every day | ORAL | 0 refills | Status: DC

## 2016-10-31 MED ORDER — OXYBUTYNIN CHLORIDE ER 10 MG PO TB24: 10.0000 mg | ORAL_TABLET | Freq: Every day | ORAL | Status: DC

## 2016-10-31 MED ORDER — VITAMIN D3 25 MCG (1000 UNIT) PO TABS: 1000.0000 [IU] | ORAL_TABLET | Freq: Every day | ORAL | 0 refills | Status: DC

## 2016-10-31 MED ORDER — METFORMIN HCL 500 MG PO TABS: 500.0000 mg | ORAL_TABLET | Freq: Every day | ORAL | 0 refills | Status: DC

## 2016-10-31 MED ORDER — BENZTROPINE MESYLATE 1 MG PO TABS: 1.0000 mg | ORAL_TABLET | Freq: Every day | ORAL | 0 refills | Status: DC

## 2016-10-31 NOTE — Plan of Care (Signed)
Problem: [facility] Participation in Recreation Therapeutic Interventions Goal: STG-Patient will attend/participate in Rec Therapy Group Ses STG-The Patient will attend and participate in Recreation Therapy Group Sessions  Outcome: Adequate for Discharge Pt attended and participated in communication recreation therapy session.  Victorino Sparrow, LRT/CTRS

## 2016-10-31 NOTE — Progress Notes (Signed)
Recreation Therapy Notes  INPATIENT RECREATION TR PLAN  Patient Details Name: [name] [name] MRN: [id number] DOB: 05/07/1980 Today's Date: 10/31/2016  Rec Therapy Plan Is patient appropriate for Therapeutic Recreation?: Yes Treatment times per week: about 3 days Estimated Length of Stay: 5-7 days TR Treatment/Interventions: Group participation (Comment)  Discharge Criteria Pt will be discharged from therapy if:: Discharged Treatment plan/goals/alternatives discussed and agreed upon by:: Patient/family  Discharge Summary Short term goals set: Patient will attend and participate in Recreation Therapy Group Sessions  Short term goals met: Adequate for discharge Progress toward goals comments: Groups attended Which groups?: Communication Reason goals not met: None Therapeutic equipment acquired: N/A Reason patient discharged from therapy: Discharge from hospital Pt/family agrees with progress & goals achieved: Yes Date patient discharged from therapy: 10/31/16   Victorino Sparrow, LRT/CTRS  Ria Comment, Amorette Charrette A 10/31/2016, 12:04 PM

## 2016-10-31 NOTE — Progress Notes (Signed)
Recreation Therapy Notes  Date: 10/31/16 Time: 1000 Location:  500 Hall  Group Topic: Communication  Goal Area(s) Addresses:  Patient will effectively communicate with peers in group.  Patient will verbalize benefit of healthy communication. Patient will verbalize positive effect of healthy communication on post d/c goals.  Patient will identify communication techniques that made activity effective for group.   Intervention:  Rubber discs  Activity: Traffic Jam.  Patients were given one rubber disc each, plus one extra for the group.  Patients were to use the discs to travel as a group from one end of the hall to the other and back.  If any person stepped off their disc, the group would have to start from the beginning.    Education:Communication, Discharge Planning  Education Outcome: Acknowledges understanding/In group clarification offered/Needs additional education.   Clinical Observations/Feedback: Pt did not attend group.   Victorino Sparrow, LRT/CTRS         Victorino Sparrow A 10/31/2016 12:30 PM

## 2016-10-31 NOTE — Discharge Summary (Signed)
Physician Discharge Summary Note  Patient:  [name] [name] is an 36 y.o., female MRN:  [id number] DOB:  December 16, 1980 Patient phone:  [phone] (home)  Patient address:   [address] [place] [address],  Total Time spent with patient: Greater than 30 minutes  Date of Admission:  10/23/2016 Date of Discharge: 10-31-16  Reason for Admission: Worsening psychosis.  Principal Problem: Schizoaffective disorder, bipolar type [facility])  Discharge Diagnoses: Patient Active Problem List   Diagnosis Date Noted  . Schizoaffective disorder, bipolar type ([place]) [F25.0] 01/28/2015    Priority: High  . Insomnia [G47.00]   . Anxiety state [F41.1]   . Overactive bladder [N32.81]   . Diabetes mellitus ([place]) [E11.9] 02/08/2015  . Non compliance w medication regimen [Z91.14]    Past Psychiatric History: Schizoaffective disorder, Bipolar-type  Past Medical History:  Past Medical History:  Diagnosis Date  . Bipolar affective disorder, currently manic, mild ([place])   . Diabetes mellitus without complication ([place])   . Schizophrenia [facility])     Past Surgical History:  Procedure Laterality Date  . WISDOM TOOTH EXTRACTION     Family History:  Family History  Problem Relation Age of Onset  . Drug abuse Maternal Uncle    Family Psychiatric  History: See H&P  Social History:  History  Alcohol Use No     History  Drug Use No    Social History   Social History  . Marital status: Legally Separated    Spouse name: N/A  . Number of children: N/A  . Years of education: N/A   Social History Main Topics  . Smoking status: Never Smoker  . Smokeless tobacco: Never Used  . Alcohol use No  . Drug use: No  . Sexual activity: Not Currently   Other Topics Concern  . None   Social History Narrative  . None   Hospital Course: This is one of several admission assessments in the [facility] alone for this 36 year old AA female with hx of chronic mental illness. She has been a patient in this  hospital x multiple time, each related to worsening symptoms due to non-adherent to her medication regimen.This time around, she is admitted to the [facility] from the [facility] with complaints worsening symptoms due to being off of her mental health medications for 1 week. Chart review indicated that patient was responding to some internal stimuli saying, she has death sentence. During this assessment, [name] barely responded to the assessment questions. She presents as a poor historian. She seems to be responding to some internal stimuli. She is not making any eye contact. She is thought blocking. She says, "I am taking my medicines, but not as prescribed. A lot of people tells me to take medications".  This is one of [name] [name]'s discharge many summaries from this [facility] hospital alone. She is known in this hospital from previous hospitalizations for mood stabilization treatments. She is known to be non-adherent to her treatment regimen. This time around, she was admitted for worsening symptoms of Bipolar I disorder, most recent episode, manic. She was in need of mood stabilization treatment.   After evaluation of her presenting symptoms, [name]  was medicated & discharged on; Abilify 30 mg for mood control, Benztropine 1 mg for EPS/akathasia, Hydroxyzine 50 prn mg for anxiety & Temazepam 7.5 mg for insomnia. Her other pre-existing medical problems were identified and treated by resuming her pertinent home medications for those health issues. She tolerated her treatment regimen without any adverse effects or reactions.  During the course of her hospitalization, [name] [name]'s progress was monitored by observation & her daily reports of symptom reduction noted. Her emotional & mental status were assessed & monitored by the daily self-inventory reports completed by her & the clinical staff. She was also evaluated daily by the treatment team for mood stability and plans for continued recovery after discharge.  [name] [name] was offered further treatment options upon discharge on outpatient basis as listed below. She was provided with all the necessary information needed to make this appointment without problems.  Upon discharge, [name] [name] was both mentally & medically stable. She is adamntly denies any suicidal/homicidal ideations, auditory/visual/tactile hallucinations, delusional thoughts & or paranoia. She left [facility] with all personal belongings in no apparent distress. Transportation per friend.   Physical Findings: AIMS: Facial and Oral Movements Muscles of Facial Expression: None, normal Lips and Perioral Area: None, normal Jaw: None, normal Tongue: None, normal,Extremity Movements Upper (arms, wrists, hands, fingers): None, normal Lower (legs, knees, ankles, toes): None, normal, Trunk Movements Neck, shoulders, hips: None, normal, Overall Severity Severity of abnormal movements (highest score from questions above): None, normal Incapacitation due to abnormal movements: None, normal Patient's awareness of abnormal movements (rate only patient's report): No Awareness, Dental Status Current problems with teeth and/or dentures?: No Does patient usually wear dentures?: No  CIWA:    COWS:     Musculoskeletal: Strength & Muscle Tone: within normal limits Gait & Station: normal Patient leans: N/A  Psychiatric Specialty Exam: Physical Exam  Constitutional: She appears well-developed.  HENT:  Head: Normocephalic.  Eyes: Pupils are equal, round, and reactive to light.  Neck: Normal range of motion.  Cardiovascular: Normal rate.   Respiratory: Effort normal.  GI: Soft.  Genitourinary:  Genitourinary Comments: Deferred  Musculoskeletal: Normal range of motion.  Neurological: She is alert.  Skin: Skin is warm.    Review of Systems  Constitutional: Negative.   HENT: Negative.   Eyes: Negative.   Respiratory: Negative.   Cardiovascular: Negative.   Gastrointestinal: Negative.   Genitourinary:  Negative.   Musculoskeletal: Negative.   Skin: Negative.   Neurological: Negative.   Endo/Heme/Allergies: Negative.   Psychiatric/Behavioral: Positive for depression (Stabilized with medication prior to discharge) and hallucinations (Hx, Psychosis). Negative for memory loss, substance abuse and suicidal ideas. The patient has insomnia (Stable). The patient is not nervous/anxious.     Blood pressure 108/77, pulse (!) 103, temperature (!) 97.4 F (36.3 C), resp. rate 16, height 5\' 3"  (1.6 m), weight 72.1 kg (159 lb), SpO2 100 %.Body mass index is 28.17 kg/m.  See Md's SRA   Have you used any form of tobacco in the last 30 days? (Cigarettes, Smokeless Tobacco, Cigars, and/or Pipes): Patient Refused Screening  Has this patient used any form of tobacco in the last 30 days? (Cigarettes, Smokeless Tobacco, Cigars, and/or Pipes): N/A  Blood Alcohol level:  Lab Results  Component Value Date   ETH <10 10/21/2016   ETH <5 82/42/3536   Metabolic Disorder Labs:  Lab Results  Component Value Date   HGBA1C 5.6 07/25/2015   MPG 114 07/25/2015   MPG 137 02/01/2015   Lab Results  Component Value Date   PROLACTIN 46.5 (H) 07/25/2015   PROLACTIN 21.6 02/01/2015   Lab Results  Component Value Date   CHOL 135 07/25/2015   TRIG 59 07/25/2015   HDL 50 07/25/2015   CHOLHDL 2.7 07/25/2015   VLDL 12 07/25/2015   LDLCALC 73 07/25/2015   LDLCALC 78 02/01/2015   See Psychiatric Specialty Exam  and Suicide Risk Assessment completed by Attending Physician prior to discharge.  Discharge destination:  Home  Is patient on multiple antipsychotic therapies at discharge:  No   Has Patient had three or more failed trials of antipsychotic monotherapy by history:  No  Recommended Plan for Multiple Antipsychotic Therapies: NA  Allergies as of 10/31/2016   No Known Allergies     Medication List    STOP taking these medications   divalproex 500 MG DR tablet Commonly known as:  DEPAKOTE    RISPERIDONE PO     TAKE these medications     Indication  ARIPiprazole 30 MG tablet Commonly known as:  ABILIFY Take 1 tablet (30 mg total) by mouth daily. For mood control  Indication:  Mood control   benztropine 1 MG tablet Commonly known as:  COGENTIN Take 1 tablet (1 mg total) by mouth at bedtime. For prevention of drug induced tremors  Indication:  Extrapyramidal Reaction caused by Medications   cholecalciferol 1000 units tablet Commonly known as:  VITAMIN D Take 1 tablet (1,000 Units total) by mouth daily. For bone health  Indication:  Bone health   docusate sodium 100 MG capsule Commonly known as:  COLACE Take 1 capsule (100 mg total) by mouth daily as needed for moderate constipation. (May purchase form over the counter at the pharmacy): For constipation  Indication:  Constipation   hydrOXYzine 50 MG tablet Commonly known as:  ATARAX/VISTARIL Take 1 tablet (50 mg total) by mouth 3 (three) times daily as needed for anxiety.  Indication:  Feeling Anxious   ibuprofen 200 MG tablet Commonly known as:  ADVIL,MOTRIN Take 4 tablets (800 mg total) by mouth every 6 (six) hours as needed for moderate pain.  Indication:  Mild to Moderate Pain   metFORMIN 500 MG tablet Commonly known as:  GLUCOPHAGE Take 1 tablet (500 mg total) by mouth daily with breakfast. For diabetes management  Indication:  Type 2 Diabetes   multivitamin with minerals Tabs tablet Take 1 tablet by mouth daily. For Vitamin supplementation What changed:  additional instructions  Indication:  Vitamin supplement   oxybutynin 10 MG 24 hr tablet Commonly known as:  DITROPAN-XL Take 1 tablet (10 mg total) by mouth at bedtime. For over active bladder  Indication:  Frequent Urination   temazepam 7.5 MG capsule Commonly known as:  RESTORIL Take 1 capsule (7.5 mg total) by mouth at bedtime. For sleep  Indication:  Trouble Sleeping      Follow-up Information    [facility] Follow up on  11/03/2016.   Why:  Follow up appointment is on 11/03/16 at 8am.  Please bring your ID, social security card, any proof of income, and insurance card with co-pay.  Contact information: [address] [place] [address] [phone]          Follow-up recommendations: Activity:  As tolerated Diet: As recommended by your primary care doctor. Keep all scheduled follow-up appointments as recommended.   Comments: Patient is instructed prior to discharge to: Take all medications as prescribed by his/her mental healthcare provider. Report any adverse effects and or reactions from the medicines to his/her outpatient provider promptly. Patient has been instructed & cautioned: To not engage in alcohol and or illegal drug use while on prescription medicines. In the event of worsening symptoms, patient is instructed to call the crisis hotline, 911 and or go to the nearest ED for appropriate evaluation and treatment of symptoms. To follow-up with his/her primary care provider for your  other medical issues, concerns and or health care needs.   Signed: Encarnacion Slates, NP, PMHNP, FNP-BC 10/31/2016, 9:54 AM   Patient seen, Suicide Assessment Completed.  Disposition Plan Reviewed   - Lakenzie Mcclafferty is a 36 y/o F with schizoaffective disorder who presented with worsening psychosis and mood symptoms. She had been off of her medications. She was religiously preoccupied and responding to internal stimuli. She had periods of agitation. She was restarted on previous medication of abilify and titrated to dose of 30mg  qday which she tolerated without difficulty. Pt had some improvement of her hallucinations and preoccupation to the degree that she would be safe for discharge. She denied SI/HI/VH at time of discharge. She was able to articulate a well developed safety plan of returning to [facility] or contacting emergency services if she felt unable to maintain her own safety. She had no further questions, comments, or  concerns.   Plan Of Care/Follow-up recommendations:  -DC to outpatient level of care - Schizoaffective disorder, bipolar type:             - Continue abilify 30mg  qDay             - Continue 50mg  TID prn anxiety -Extrapyramidal side effects             - Continue cogentin 1mg  qhs -Diabetes Type II             - Continue metformin 500mg  qDay -Insomnia             -continue temazepam 7.5mg  qhs  Activity:  as tolerated Diet:  normal Tests:  N/A Other:  see above for DC plan

## 2016-10-31 NOTE — Progress Notes (Signed)
Recreation Therapy Notes  Date: 10/31/16 Time: 1000 Location:  [address]  Group Topic: Communication  Goal Area(s) Addresses:  Patient will effectively communicate with peers in group.  Patient will verbalize benefit of healthy communication. Patient will verbalize positive effect of healthy communication on post d/c goals.  Patient will identify communication techniques that made activity effective for group.   Behavioral Response: Engaged  Intervention:  Conservation officer, nature.  Patients were given one rubber disc each, plus one extra for the group.  Patients were to use the discs to travel as a group from one end of the hall to the other and back.  If any person stepped off their disc, the group would have to start from the beginning.    Education: Communication, Discharge Planning  Education Outcome: Acknowledges understanding/In group clarification offered/Needs additional education.   Clinical Observations/Feedback: Pt arrived late to group.  Staff explained the rules of the game, pt joined in on the activity.  Pt worked well with her peers to complete the activity.  Pt was bright and engaged during activity.   Victorino Sparrow, LRT/CTRS      Victorino Sparrow A 10/31/2016 11:52 AM

## 2016-10-31 NOTE — Progress Notes (Signed)
D: Pt is flat and withdrawn to room; remained in bed all evening. Pt made minimal eye contact. Pt at the time of assessment complained of increased anxiety, "some of the people here make me nervous." Pt continue to endorse auditory hallucinations; "once I open my bible, I hear his voice." Pt refused to participate. Pt observing talking to someone that is not there. A: Medications offered as prescribed.  Support, encouragement, and safe environment provided. 15-minute safety checks continue. R: Pt was med compliant. Safety checks continue.

## 2016-10-31 NOTE — BHH Suicide Risk Assessment (Signed)
[facility] Discharge Suicide Risk Assessment   Principal Problem: Schizoaffective disorder, bipolar type [facility]) Discharge Diagnoses:  Patient Active Problem List   Diagnosis Date Noted  . Insomnia [G47.00]   . Anxiety state [F41.1]   . Overactive bladder [N32.81]   . Diabetes mellitus ([place]) [E11.9] 02/08/2015  . Schizoaffective disorder, bipolar type ([place]) [F25.0] 01/28/2015  . Non compliance w medication regimen [Z91.14]     Total Time spent with patient: 30 minutes  Musculoskeletal: Strength & Muscle Tone: within normal limits Gait & Station: normal Patient leans: N/A  Psychiatric Specialty Exam: Review of Systems  Constitutional: Negative for chills and fever.  Respiratory: Negative for cough and shortness of breath.   Cardiovascular: Negative for chest pain.  Gastrointestinal: Negative for heartburn and nausea.    Blood pressure 108/77, pulse (!) 103, temperature (!) 97.4 F (36.3 C), resp. rate 16, height 5\' 3"  (1.6 m), weight 72.1 kg (159 lb), SpO2 100 %.Body mass index is 28.17 kg/m.  General Appearance: Fairly Groomed  Engineer, water::  Fair  Speech:  Clear and Coherent  Volume:  Normal  Mood:  Anxious  Affect:  Blunt, Congruent and Flat  Thought Process:  Coherent and Goal Directed  Orientation:  Full (Time, Place, and Person)  Thought Content:  Logical and Hallucinations: Auditory  Suicidal Thoughts:  No  Homicidal Thoughts:  No  Memory:  Immediate;   Good Recent;   Good Remote;   Good  Judgement:  Fair  Insight:  Fair  Psychomotor Activity:  Normal  Concentration:  Good  Recall:  Good  Fund of Knowledge:Good  Language: Good  Akathisia:  No  Handed:    AIMS (if indicated):     Assets:  Communication Skills Desire for Improvement Housing Resilience Social Support  Sleep:  Number of Hours: 3.5  Cognition: WNL  ADL's:  Intact   Mental Status Per Nursing Assessment::   On Admission:  NA (UTA: Elective mutism)  Demographic Factors:  Low socioeconomic  status and Unemployed  Loss Factors: Financial problems/change in socioeconomic status  Historical Factors: NA  Risk Reduction Factors:   Living with another person, especially a relative, Positive social support, Positive therapeutic relationship and Positive coping skills or problem solving skills  Continued Clinical Symptoms:  Schizophrenia:   Paranoid or undifferentiated type  Cognitive Features That Contribute To Risk:  None    Suicide Risk:  Minimal: No identifiable suicidal ideation.  Patients presenting with no risk factors but with morbid ruminations; may be classified as minimal risk based on the severity of the depressive symptoms  Follow-up Information    [facility] Follow up on 11/03/2016.   Why:  Follow up appointment is on 11/03/16 at 8am.  Please bring your ID, social security card, any proof of income, and insurance card with co-pay.  Contact information: [address] [place] [address] [phone]          Subjective data: - [name] [name] is a 36 y/o F with schizoaffective disorder who presented with worsening psychosis and mood symptoms. She had been off of her medications. She was religiously preoccupied and responding to internal stimuli. She had periods of agitation. She was restarted on previous medication of abilify and titrated to dose of 30mg  qday which she tolerated without difficulty. Pt had some improvement of her hallucinations and preoccupation to the degree that she would be safe for discharge. She denied SI/HI/VH at time of discharge. She was able to articulate a well developed safety plan of returning to  [facility] or contacting emergency services if she felt unable to maintain her own safety. She had no further questions, comments, or concerns.   Plan Of Care/Follow-up recommendations:  -DC to outpatient level of care - Schizoaffective disorder, bipolar type:  - Continue abilify 30mg  qDay  - Continue 50mg  TID prn  anxiety -Extrapyramidal side effects  - Continue cogentin 1mg  qhs -Diabetes Type II  - Continue metformin 500mg  qDay -Insomnia  -continue temazepam 7.5mg  qhs  Activity:  as tolerated Diet:  normal Tests:  N/A Other:  see above for DC plan  [name] Bracken, MD 10/31/2016, 9:23 AM

## 2016-10-31 NOTE — Progress Notes (Signed)
  [facility] Adult Case Management Discharge Plan :  Will you be returning to the same living situation after discharge:  Yes,  Home At discharge, do you have transportation home?: Yes,  Friend  Do you have the ability to pay for your medications: Yes,  MCR  Release of information consent forms completed and in the chart;  Patient's signature needed at discharge.  Patient to Follow up at: Follow-up Information    [facility] Follow up on 11/03/2016.   Why:  Follow up appointment is on 11/03/16 at 8am.  Please bring your ID, social security card, any proof of income, and insurance card with co-pay.  Contact information: [address] [place] [address] [phone]           Next level of care provider has access to [place] and Suicide Prevention discussed: Yes,  Yes  Have you used any form of tobacco in the last 30 days? (Cigarettes, Smokeless Tobacco, Cigars, and/or Pipes): Patient Refused Screening  Has patient been referred to the Quitline?: Patient refused referral  Patient has been referred for addiction treatment: [place], Student-Social Work 10/31/2016, 1:04 PM

## 2016-10-31 NOTE — Progress Notes (Signed)
D: Patient verbalizes readiness for discharge. Denies suicidal and homicidal ideations. Denies current av hallucination.  No complaints of pain.  A:  Patient receptive to discharge instructions. Questions encouraged, pt verbalizes understanding of med regimen and scheduled appt.  R:  Escorted to the lobby by this RN.

## 2016-11-01 ENCOUNTER — Encounter (HOSPITAL_COMMUNITY): Payer: Self-pay | Admitting: *Deleted

## 2016-11-01 ENCOUNTER — Emergency Department (HOSPITAL_COMMUNITY)
Admission: EM | Admit: 2016-11-01 | Discharge: 2016-11-05 | Disposition: A | Discharge status: Other Acute Inpatient Hospital | Payer: Medicare Other | Attending: Emergency Medicine | Admitting: Emergency Medicine

## 2016-11-01 DIAGNOSIS — Z9119 Patient's noncompliance with other medical treatment and regimen: Secondary | ICD-10-CM | POA: Diagnosis not present

## 2016-11-01 DIAGNOSIS — F3110 Bipolar disorder, current episode manic without psychotic features, unspecified: Secondary | ICD-10-CM

## 2016-11-01 DIAGNOSIS — F259 Schizoaffective disorder, unspecified: Secondary | ICD-10-CM | POA: Insufficient documentation

## 2016-11-01 DIAGNOSIS — R443 Hallucinations, unspecified: Secondary | ICD-10-CM | POA: Diagnosis not present

## 2016-11-01 DIAGNOSIS — Z046 Encounter for general psychiatric examination, requested by authority: Secondary | ICD-10-CM | POA: Diagnosis present

## 2016-11-01 DIAGNOSIS — E119 Type 2 diabetes mellitus without complications: Secondary | ICD-10-CM | POA: Diagnosis not present

## 2016-11-01 DIAGNOSIS — R45851 Suicidal ideations: Secondary | ICD-10-CM | POA: Insufficient documentation

## 2016-11-01 LAB — URINALYSIS, ROUTINE W REFLEX MICROSCOPIC
BILIRUBIN URINE: NEGATIVE
GLUCOSE, UA: NEGATIVE mg/dL
HGB URINE DIPSTICK: NEGATIVE
Ketones, ur: NEGATIVE mg/dL
Leukocytes, UA: NEGATIVE
Nitrite: NEGATIVE
PROTEIN: NEGATIVE mg/dL
SPECIFIC GRAVITY, URINE: 1.005 (ref 1.005–1.030)
pH: 6 (ref 5.0–8.0)

## 2016-11-01 LAB — CBC WITH DIFFERENTIAL/PLATELET
Basophils Absolute: 0 10*3/uL (ref 0.0–0.1)
Basophils Relative: 0 %
EOS ABS: 0 10*3/uL (ref 0.0–0.7)
EOS PCT: 0 %
HCT: 33.8 % — ABNORMAL LOW (ref 36.0–46.0)
Hemoglobin: 11.6 g/dL — ABNORMAL LOW (ref 12.0–15.0)
LYMPHS ABS: 1.6 10*3/uL (ref 0.7–4.0)
LYMPHS PCT: 32 %
MCH: 32.9 pg (ref 26.0–34.0)
MCHC: 34.3 g/dL (ref 30.0–36.0)
MCV: 95.8 fL (ref 78.0–100.0)
MONOS PCT: 7 %
Monocytes Absolute: 0.3 10*3/uL (ref 0.1–1.0)
Neutro Abs: 3.1 10*3/uL (ref 1.7–7.7)
Neutrophils Relative %: 61 %
PLATELETS: 205 10*3/uL (ref 150–400)
RBC: 3.53 MIL/uL — AB (ref 3.87–5.11)
RDW: 12.6 % (ref 11.5–15.5)
WBC: 5.1 10*3/uL (ref 4.0–10.5)

## 2016-11-01 LAB — BASIC METABOLIC PANEL
ANION GAP: 9 (ref 5–15)
BUN: 11 mg/dL (ref 6–20)
CALCIUM: 10 mg/dL (ref 8.9–10.3)
CO2: 28 mmol/L (ref 22–32)
CREATININE: 0.82 mg/dL (ref 0.44–1.00)
Chloride: 105 mmol/L (ref 101–111)
GFR calc Af Amer: >60 mL/min (ref >60)
GFR calc non Af Amer: >60 mL/min (ref >60)
Glucose, Bld: 114 mg/dL — ABNORMAL HIGH (ref 65–99)
POTASSIUM: 4.1 mmol/L (ref 3.5–5.1)
Sodium: 142 mmol/L (ref 135–145)

## 2016-11-01 LAB — RAPID URINE DRUG SCREEN, HOSP PERFORMED
AMPHETAMINES: NOT DETECTED
Barbiturates: NOT DETECTED
Benzodiazepines: POSITIVE — AB
Cocaine: NOT DETECTED
Opiates: NOT DETECTED
TETRAHYDROCANNABINOL: NOT DETECTED

## 2016-11-01 LAB — ETHANOL: Alcohol, Ethyl (B): <10 mg/dL (ref <10)

## 2016-11-01 LAB — PREGNANCY, URINE: PREG TEST UR: NEGATIVE

## 2016-11-01 MED ORDER — TEMAZEPAM 7.5 MG PO CAPS
7.5000 mg | ORAL_CAPSULE | Freq: Every day | ORAL | Status: DC
  Administered 2016-11-01: 7.5 mg via ORAL
  Filled 2016-11-01 (×4): qty 1

## 2016-11-01 MED ORDER — ZIPRASIDONE MESYLATE 20 MG IM SOLR
20.0000 mg | Freq: Once | INTRAMUSCULAR | Status: DC
  Filled 2016-11-01: qty 20

## 2016-11-01 MED ORDER — DOCUSATE SODIUM 100 MG PO CAPS
100.0000 mg | ORAL_CAPSULE | Freq: Every day | ORAL | Status: DC | PRN
  Administered 2016-11-02 – 2016-11-03 (×2): 100 mg via ORAL
  Filled 2016-11-01 (×3): qty 1

## 2016-11-01 MED ORDER — HALOPERIDOL LACTATE 5 MG/ML IJ SOLN: 5.0000 mg | Freq: Once | INTRAMUSCULAR | Status: DC

## 2016-11-01 MED ORDER — HALOPERIDOL LACTATE 5 MG/ML IJ SOLN
5.0000 mg | Freq: Once | INTRAMUSCULAR | Status: AC | PRN
  Administered 2016-11-03: 5 mg via INTRAMUSCULAR
  Filled 2016-11-01 (×3): qty 1

## 2016-11-01 MED ORDER — LORAZEPAM 1 MG PO TABS
1.0000 mg | ORAL_TABLET | ORAL | Status: AC | PRN
  Administered 2016-11-01: 1 mg via ORAL
  Filled 2016-11-01 (×2): qty 1

## 2016-11-01 MED ORDER — RISPERIDONE 1 MG PO TBDP
2.0000 mg | ORAL_TABLET | Freq: Three times a day (TID) | ORAL | Status: DC | PRN
  Administered 2016-11-01: 2 mg via ORAL
  Filled 2016-11-01: qty 2

## 2016-11-01 MED ORDER — METFORMIN HCL 500 MG PO TABS
500.0000 mg | ORAL_TABLET | Freq: Every day | ORAL | Status: DC
  Administered 2016-11-01 – 2016-11-05 (×4): 500 mg via ORAL
  Filled 2016-11-01 (×4): qty 1

## 2016-11-01 MED ORDER — BENZTROPINE MESYLATE 1 MG PO TABS
1.0000 mg | ORAL_TABLET | Freq: Every day | ORAL | Status: DC
  Administered 2016-11-04: 1 mg via ORAL
  Filled 2016-11-01 (×4): qty 1

## 2016-11-01 MED ORDER — LORAZEPAM 2 MG/ML IJ SOLN
2.0000 mg | Freq: Once | INTRAMUSCULAR | Status: DC
  Filled 2016-11-01: qty 1

## 2016-11-01 MED ORDER — HYDROXYZINE HCL 25 MG PO TABS
50.0000 mg | ORAL_TABLET | Freq: Three times a day (TID) | ORAL | Status: DC | PRN
  Filled 2016-11-01 (×2): qty 2

## 2016-11-01 MED ORDER — VITAMIN D3 25 MCG (1000 UNIT) PO TABS
1000.0000 [IU] | ORAL_TABLET | Freq: Every day | ORAL | Status: DC
  Administered 2016-11-01 – 2016-11-05 (×5): 1000 [IU] via ORAL
  Filled 2016-11-01 (×7): qty 1

## 2016-11-01 MED ORDER — OXYBUTYNIN CHLORIDE ER 5 MG PO TB24
10.0000 mg | ORAL_TABLET | Freq: Every day | ORAL | Status: DC
  Administered 2016-11-03 – 2016-11-04 (×2): 10 mg via ORAL
  Filled 2016-11-01 (×3): qty 2
  Filled 2016-11-01 (×2): qty 1
  Filled 2016-11-01 (×2): qty 2

## 2016-11-01 MED ORDER — IBUPROFEN 800 MG PO TABS
800.0000 mg | ORAL_TABLET | Freq: Four times a day (QID) | ORAL | Status: DC | PRN
  Administered 2016-11-01: 400 mg via ORAL
  Administered 2016-11-02 – 2016-11-04 (×8): 800 mg via ORAL
  Filled 2016-11-01 (×10): qty 1

## 2016-11-01 MED ORDER — ZIPRASIDONE MESYLATE 20 MG IM SOLR
20.0000 mg | INTRAMUSCULAR | Status: DC | PRN
  Filled 2016-11-01: qty 20

## 2016-11-01 MED ORDER — ADULT MULTIVITAMIN W/MINERALS CH
1.0000 | ORAL_TABLET | Freq: Every day | ORAL | Status: DC
  Administered 2016-11-01 – 2016-11-05 (×5): 1 via ORAL
  Filled 2016-11-01 (×4): qty 1

## 2016-11-01 MED ORDER — STERILE WATER FOR INJECTION IJ SOLN
INTRAMUSCULAR | Status: AC
  Filled 2016-11-01: qty 10

## 2016-11-01 MED ORDER — ARIPIPRAZOLE 10 MG PO TABS
30.0000 mg | ORAL_TABLET | Freq: Every day | ORAL | Status: DC
  Administered 2016-11-01: 20 mg via ORAL
  Administered 2016-11-02 – 2016-11-05 (×5): 30 mg via ORAL
  Filled 2016-11-01 (×7): qty 3

## 2016-11-01 NOTE — ED Notes (Signed)
Pt requesting pain medication but says she doesn't want the whole "800 of ibuprofen b/c she doesn't want to overdose". Informed pt that I could break pill in 1/2. Pt agreed to this.
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
## 2016-11-01 NOTE — ED Notes (Signed)
Pt given meal tray.

## 2016-11-01 NOTE — ED Notes (Signed)
Patient refusing blood work and medications. EDP made aware.

## 2016-11-01 NOTE — ED Notes (Signed)
Paitent was spoken to prior to Probation officer with drawing medication from [facility] and stated she wanted to take IM shot of Ativan at that time. When writer went into room to administer medication, patient refused. Patient asked writer why was she "not allowed to sleep on her own without medication". Writer informed patient she could sleep and did not have to take medication. Patient verbalized understanding. Lights dimmed and sitter at bedside.

## 2016-11-01 NOTE — Progress Notes (Signed)
Patient became upset due to me telling her to not close the door, due to her being a psyche patient and that she must be watched at all times. Patient began to become very upset pacing back and forth in the bathroom. Talking about she wants to leave now and see her pastor.Patient is complaining about pain rely message to nurse. Patient began to say that she wants to leave. I relayed message to nurse.

## 2016-11-01 NOTE — ED Notes (Signed)
Faxed IVC Papers to Magistrate and called.

## 2016-11-01 NOTE — ED Notes (Signed)
Monitor placed in the rm for TTS assesment.

## 2016-11-01 NOTE — ED Notes (Signed)
Called [employer] to assist with Patient.
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
## 2016-11-01 NOTE — ED Triage Notes (Signed)
Pt brought in by [employer]. Reported pt needs help. Pt says all her prescriptions are at [facility]. She not sure if she even got meds due to who she is staying. Pt said she wants to get out of the house she is in. Stats they can not handle her spirituality.

## 2016-11-01 NOTE — Progress Notes (Signed)
Pt referred to the following for inpatient psychiatric treatment: [facility]

## 2016-11-01 NOTE — ED Notes (Signed)
Pt given warm blanket, peanut butter, crackers, and beverage (choice of sprite zero or water).

## 2016-11-01 NOTE — ED Provider Notes (Signed)
TTS has evaluated pt: inpt treatment recommended. Holding orders written.    Results for orders placed or performed during the hospital encounter of 33/54/56  Basic metabolic panel  Result Value Ref Range   Sodium 142 135 - 145 mmol/L   Potassium 4.1 3.5 - 5.1 mmol/L   Chloride 105 101 - 111 mmol/L   CO2 28 22 - 32 mmol/L   Glucose, Bld 114 (H) 65 - 99 mg/dL   BUN 11 6 - 20 mg/dL   Creatinine, Ser 0.82 0.44 - 1.00 mg/dL   Calcium 10.0 8.9 - 10.3 mg/dL   GFR calc non Af Amer >60 >60 mL/min   GFR calc Af Amer >60 >60 mL/min   Anion gap 9 5 - 15  Ethanol  Result Value Ref Range   Alcohol, Ethyl (B) <10 <10 mg/dL  CBC with Differential  Result Value Ref Range   WBC 5.1 4.0 - 10.5 K/uL   RBC 3.53 (L) 3.87 - 5.11 MIL/uL   Hemoglobin 11.6 (L) 12.0 - 15.0 g/dL   HCT 33.8 (L) 36.0 - 46.0 %   MCV 95.8 78.0 - 100.0 fL   MCH 32.9 26.0 - 34.0 pg   MCHC 34.3 30.0 - 36.0 g/dL   RDW 12.6 11.5 - 15.5 %   Platelets 205 150 - 400 K/uL   Neutrophils Relative % 61 %   Neutro Abs 3.1 1.7 - 7.7 K/uL   Lymphocytes Relative 32 %   Lymphs Abs 1.6 0.7 - 4.0 K/uL   Monocytes Relative 7 %   Monocytes Absolute 0.3 0.1 - 1.0 K/uL   Eosinophils Relative 0 %   Eosinophils Absolute 0.0 0.0 - 0.7 K/uL   Basophils Relative 0 %   Basophils Absolute 0.0 0.0 - 0.1 K/uL  Urinalysis, Routine w reflex microscopic  Result Value Ref Range   Color, Urine STRAW (A) YELLOW   APPearance CLEAR CLEAR   Specific Gravity, Urine 1.005 1.005 - 1.030   pH 6.0 5.0 - 8.0   Glucose, UA NEGATIVE NEGATIVE mg/dL   Hgb urine dipstick NEGATIVE NEGATIVE   Bilirubin Urine NEGATIVE NEGATIVE   Ketones, ur NEGATIVE NEGATIVE mg/dL   Protein, ur NEGATIVE NEGATIVE mg/dL   Nitrite NEGATIVE NEGATIVE   Leukocytes, UA NEGATIVE NEGATIVE  Urine rapid drug screen (hosp performed)  Result Value Ref Range   Opiates NONE DETECTED NONE DETECTED   Cocaine NONE DETECTED NONE DETECTED   Benzodiazepines POSITIVE (A) NONE DETECTED   Amphetamines NONE DETECTED NONE DETECTED   Tetrahydrocannabinol NONE DETECTED NONE DETECTED   Barbiturates NONE DETECTED NONE DETECTED  Pregnancy, urine  Result Value Ref Range   Preg Test, Ur NEGATIVE NEGATIVE      Francine Graven, DO 11/01/16 1000
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
## 2016-11-01 NOTE — Progress Notes (Signed)
Patient asked me to give her some milk and a washcloth so she can wash her babies face,and patient mumbling words, immediately fell back to sleep.

## 2016-11-01 NOTE — Progress Notes (Signed)
Patient called me into the room stated that she offered her friend her private parts but she didn't want his or hers;patient went back to sleep.

## 2016-11-01 NOTE — ED Notes (Signed)
Once served, IVC Papers faxed to [facility].

## 2016-11-01 NOTE — ED Provider Notes (Signed)
Pt escalating: trying to leave the ED, screaming at ED staff, active psychosis. Agitation meds ordered PO/IM. IVC paperwork completed. Police and Security at bedside.    Francine Graven, DO 11/01/16 1236

## 2016-11-02 DIAGNOSIS — F3110 Bipolar disorder, current episode manic without psychotic features, unspecified: Secondary | ICD-10-CM | POA: Diagnosis not present

## 2016-11-02 LAB — CBG MONITORING, ED: GLUCOSE-CAPILLARY: 135 mg/dL — AB (ref 65–99)

## 2016-11-02 MED ORDER — RISPERIDONE 1 MG PO TABS
ORAL_TABLET | ORAL | Status: AC
  Filled 2016-11-02: qty 2

## 2016-11-02 MED ORDER — HALOPERIDOL 5 MG PO TABS
ORAL_TABLET | ORAL | Status: AC
  Filled 2016-11-02: qty 1

## 2016-11-02 MED ORDER — HALOPERIDOL 2 MG PO TABS
2.0000 mg | ORAL_TABLET | Freq: Once | ORAL | Status: DC
Start: 2016-11-02 — End: 2016-11-05
  Filled 2016-11-02: qty 1

## 2016-11-02 NOTE — ED Notes (Addendum)
Pt up talking with sitter- sitter reports pt is asking to have her sugar checked. Upon entering room, pt shaking the bed sheets out, putting them on the bed, then spreading them out again. Pt repeating these actions over again. This nurse suggested pt take another dose of Risperdal. Pt agreed to take.

## 2016-11-02 NOTE — Progress Notes (Signed)
Patient woke up out of sleep and began to say she feels like someone is holding me down. I reassured patient that she is safe and that no one is holding her head down. Patient began to say that she was just washing the little girl and that she didn't molest her. Patient then said she is not a prostitue nor adultery at least not anymore. That god is with her and allowed her to have these dreams;Patient then stated that she doesn't need this medication and is ready to leave. Patient is talking about multiple topics in one sentence.

## 2016-11-02 NOTE — Progress Notes (Signed)
Patient asking for pain medication,relay message to the nurse.

## 2016-11-02 NOTE — ED Notes (Signed)
Pt reports leg pain to R lower leg that she had complained o earlier  Dr Raliegh Ip notified

## 2016-11-02 NOTE — ED Notes (Signed)
Pt to desk, whispers to nurse that she would like to sign herself out voluntarily  Reassurance that I would broach this subject with physician and reply as soon as possible

## 2016-11-02 NOTE — Progress Notes (Signed)
Patient is yelliing and screaming having a conversation with herself. I reassured patient that no one was in the room. Patient yelled at me to get out of room.

## 2016-11-02 NOTE — ED Notes (Signed)
Resting on her back with eyes closed, HOB elevated  She reports her pain is better-

## 2016-11-02 NOTE — BH Assessment (Signed)
[facility] Assessment Progress Note    TTS reassessed patient this morning.  She continues to be very paranoid and delusional.  She states that she has been seeing angels and prophets have been speaking to her. She is very religiously focused and states that the prophets have told her that she will be healed and not have to take anymore medications,  Her nurse indicated that she has refused some medicationss and the overnight nurse had to spend thirty minutes with her just to get her to take half a dose of her anti-psychotic. Patient is hyper-verbal, disorganized with her thoughts and tangential.  Patient states that she does not feel like she needs to be in the hospital because she is not suicidal or homicidal.  Patient states that she is not willing to take medications as prescribed and states that she will take what she feels like she needs and what works, but states that she feels like if she takes too many medications that she will die.  Will consult with NP on patient's disposition, but it appears that patient will still require inpatient treatment in order to treat her psychosis.

## 2016-11-02 NOTE — Progress Notes (Signed)
Patient is now calm.

## 2016-11-02 NOTE — ED Notes (Signed)
Meal provided  Pt request med for pain of L leg

## 2016-11-02 NOTE — ED Notes (Signed)
Pt is calmer than yesterday. She is questioning her meds stating that she is concerned with  Her bowel routine as her mother had an ostomy and she does not want one and feels that should she have a BM daily, then she will not have to have an ostomy.  She also states that reglan makes her go to the bathroom an request that the physician be asked for reglan to help her facilitate her bowel routine.  She also reports that her R hip is painful at times but the her R calf, foot and toes are painful  and when she looked on the internet her symptoms were the same as gangrene. She is reassured that she does not appear to have gangrene and she seems reassured by this   She asks when she is getting to go home and reports that she has talked to the man in the TV from [facility] this am.

## 2016-11-02 NOTE — Progress Notes (Signed)
Patient  having a conversation with herself in the bathroom talking about multiple topics.

## 2016-11-03 DIAGNOSIS — F3110 Bipolar disorder, current episode manic without psychotic features, unspecified: Secondary | ICD-10-CM | POA: Diagnosis not present

## 2016-11-03 LAB — CBG MONITORING, ED: GLUCOSE-CAPILLARY: 79 mg/dL (ref 65–99)

## 2016-11-03 MED ORDER — HALOPERIDOL LACTATE 5 MG/ML IJ SOLN: 5.0000 mg | Freq: Once | INTRAMUSCULAR | Status: DC

## 2016-11-03 MED ORDER — RISPERIDONE 2 MG PO TABS
2.0000 mg | ORAL_TABLET | ORAL | Status: AC
  Administered 2016-11-03: 2 mg via ORAL
  Filled 2016-11-03: qty 1

## 2016-11-03 MED ORDER — LORAZEPAM 2 MG/ML IJ SOLN: 1.0000 mg | Freq: Once | INTRAMUSCULAR | Status: DC

## 2016-11-03 NOTE — Progress Notes (Signed)
Update on the patient's referrals to facilities for inpatient treatment.   PENDING [facility] - currently on wait list, per [facility] - no answer, left HIPAA compliant voicemail.   [facility] - due to the patient being IVC'd  ( facility not authorized to accept IVC'd patients) [facility] - due to chronicity of illness    Disposition CSW/TTS will continue to seek placement.   Radonna Ricker MSW, [place] Disposition [phone]

## 2016-11-03 NOTE — ED Notes (Signed)
Patient asking for something to eat at this time. Patient given lunch tray and eating at this time.

## 2016-11-03 NOTE — ED Notes (Signed)
Pt ambulatory to restroom. Pt in restroom for appx 15 min, RN called to room to help redirect pt to stretcher. Pt continuously arguing with herself about various things.

## 2016-11-03 NOTE — ED Notes (Signed)
Patient refusing to have vitals signs taken. Patient refused breakfast, stating that she will only drink water.

## 2016-11-03 NOTE — ED Notes (Signed)
Pt has refused vital signs.

## 2016-11-03 NOTE — ED Notes (Signed)
Pt is yelling out at this time. Is having a full on conversation and argument with herself.

## 2016-11-03 NOTE — ED Notes (Signed)
Pt given Bible, pt given phone and instructed she can use for 5 minutes. Sitter, [name] reports pt is wanting to call someone to report staff. Larry,Security, this nurse, Jeneen Rinks, RN and [employer] charge nurse to room and to take phone from pt. Pt states, "Ya'll don't want the truth to be known" "ya'll are trying to poisen me!". Security locked bathroom in pt room as pt tries to hide in the bathroom and took phone in bathroom prior to staff having to take phone from pt.

## 2016-11-03 NOTE — ED Notes (Signed)
Patient pacing in room, making bed. Patient having full conversation with herself. Hearing voices that is not there.

## 2016-11-03 NOTE — ED Notes (Signed)
Pt escorted to shower by security and sitter

## 2016-11-03 NOTE — ED Notes (Signed)
Pt continues to yell out, cussing at staff, calling me "white racist bitch" and liar. This nurse and Ron, charge nurse enter room to give Haldol injection. Pt continues to yell after shot was given. [employer] remains at bedside.

## 2016-11-03 NOTE — ED Notes (Signed)
Patient states that she needs a laxative

## 2016-11-03 NOTE — ED Notes (Signed)
Patient asking for Tylenol at this time.

## 2016-11-03 NOTE — BH Assessment (Signed)
[place] Assessment Progress Note  Reassessment--Pt was cooperative during assessment. She continues to be disoriented to date and situation, "I can't rememeber things". She continues to be hyper-religious and irritable with delusions. She denies current SI, HI, and admits to seeing angels and hearing God talk to her. Pt has rapid speech, tangential thinking, normal movement, was coloring and writing on her tray from her bed during assessment. Per Ricky Ala, NP, TTS to continue to seek placement.
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
## 2016-11-03 NOTE — ED Notes (Signed)
Pt requesting Bible-- lying in bed- beginning to calm down at this time. Will obtain Bible per pt request.

## 2016-11-03 NOTE — ED Notes (Signed)
Patient is verbally loud and inappropriately speaking at this time. Security is at door at this time.

## 2016-11-03 NOTE — ED Notes (Signed)
Patient asking if we had any religious pamphlets or papers she can have, because "they won't let me have my Bible that's in my purse". Asking if she can watch religious TV station.

## 2016-11-03 NOTE — ED Notes (Signed)
Spoke with pt to attempt to verbally deescalate pts behavior. Pt agreeable to take Riperdal and abilify. Pt very paranoid with thoughts, required through explanation to take meds

## 2016-11-03 NOTE — ED Notes (Signed)
Pt continues to talk outloud but is calm. Talks frequently about visions from God

## 2016-11-03 NOTE — ED Notes (Signed)
Patient is having consult at this time.

## 2016-11-03 NOTE — ED Notes (Signed)
Patient resting at this time. Patient is quite

## 2016-11-03 NOTE — ED Notes (Addendum)
[place] talking with Laurell Roof about leaving prescription eye glasses there. Pt calm at this time.
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
## 2016-11-03 NOTE — ED Notes (Signed)
Pt sitting in bed reading the Bible. Pt calm at this moment. Will hold Haldol and Ativan at this time.

## 2016-11-03 NOTE — ED Notes (Signed)
Pt continues to holler out, arguing with herself. Attempted to redirect pt, RN in to ask pt to stop yelling and pt responds with "well they are asking me questions loudly so I need to speak loudly so they can hear me."

## 2016-11-03 NOTE — ED Notes (Signed)
TTS in room.  

## 2016-11-03 NOTE — ED Notes (Signed)
Patient allowed me to take vitals signs at this time, but she had to use hand sanitizer first. Patient apologized for behavior at this time also. Patient resting in bed.

## 2016-11-03 NOTE — ED Notes (Signed)
Pt refusing to let anyone take her vital signs.

## 2016-11-03 NOTE — ED Notes (Signed)
RN Shirlean Mylar in room with pt

## 2016-11-03 NOTE — ED Notes (Signed)
Have given pt crayons and paper to write with. Pt is calm and cooperative, but talking about somebody dying at the wheel.

## 2016-11-03 NOTE — ED Notes (Signed)
Have given pt the phone to use at this time

## 2016-11-03 NOTE — ED Notes (Addendum)
[facility]   Per pt. She would like for me to give information to person.  Mr. Eleonore Chiquito called wondering why pt was going to be sent to another [facility] facility. Stated that she was IVC'd and that [facility] has recommended inpatient placement. Mr. Eleonore Chiquito verbalized understanding

## 2016-11-03 NOTE — ED Notes (Signed)
Patient is quite at this time. Patient asking for telephone also

## 2016-11-03 NOTE — ED Notes (Signed)
Pt ambulatory to bathroom escorted by sitter, pt yelling in bathroom, pt in bathroom for approx 3-4 minutes. Pt comes out of bathroom and stands in hallway looking around, pt instructed to go back to room  Pt remains agitated, Dr Lita Mains made aware- new orders received.

## 2016-11-03 NOTE — ED Notes (Signed)
Pt yelling having conversation with herself about God, continues rambling random thoughts.Security notified.

## 2016-11-03 NOTE — ED Notes (Signed)
Patient given ice water at this time. Patient in bed writing scriptures.

## 2016-11-03 NOTE — ED Notes (Addendum)
Patient  sitting in restroom having conversation with herself at this time.

## 2016-11-03 NOTE — ED Notes (Signed)
Patient went in bathroom with lights off and door closed. Patient upset that she was told that she could not do that. States that we are taking her rights away. Patient states ": I have the devil in me for not letting her stay in the dark."  Patient cussing at this time. RN made aware.

## 2016-11-03 NOTE — ED Notes (Signed)
Pt back from shower.

## 2016-11-03 NOTE — ED Notes (Signed)
Pt becoming increasingly agitated. Pt yelling at staff and responding to external stimuli. Pt redirected and told to lie down in bed. Pt cooperative now.

## 2016-11-04 DIAGNOSIS — F3110 Bipolar disorder, current episode manic without psychotic features, unspecified: Secondary | ICD-10-CM | POA: Diagnosis not present

## 2016-11-04 LAB — CBG MONITORING, ED: GLUCOSE-CAPILLARY: 146 mg/dL — AB (ref 65–99)

## 2016-11-04 NOTE — ED Notes (Signed)
Pt requesting to be d/c either today or on November 3rd. Made pt aware pt is not to be d/c at this time and [facility] is still seeking placement. Pt stating while TTS reassessment today she was told she was going home. Confirmed with [facility] pt is still in-pt treatment and are still attempting to seek placement. Pt states she does not want to be d/c between this time "because you know the season with Halloween."

## 2016-11-04 NOTE — ED Notes (Signed)
Pt again called this RN into room requesting that she be d/c today and to please arrange a meeting with her pastor to verify she is better. Made pt aware that she is IVC'd and inpt tx is the plan at this time. Repeating this to pt, but pt is stating the same thing over and over. Can see pt is getting more agitated.

## 2016-11-04 NOTE — ED Notes (Signed)
Pt yelling out about "I didn't molest those kids, I just disciplined them...." Pt yelling at TV. Nurse to room asking pt to please quit yelling as other patients can hear and it's disturbing. Pt states, "they are talking back and accusing me through the TV". Informed pt that we could turn the TV off, pt says, "no I need to know what is going on". Informed pt that we would cut off TV if she did not be quiet.  Pt quiet for a few minutes, then started yelling again. Nurse back to room, asked pt if she wanted a shot to calm her down. Pt says "no I do not". Nurse informed pt that if she does not stop yelling and calm down, she would be given medication in shot form. Ron, Camera operator and security aware.

## 2016-11-04 NOTE — ED Notes (Signed)
Patient asked to speak to social worker, notified Tori  NT to relay message to her nurse Daphyne.

## 2016-11-04 NOTE — ED Notes (Signed)
Family at bedside. 

## 2016-11-04 NOTE — ED Notes (Signed)
Pt requesting a pen, told pt we cannot give her a pen but was given crayons.

## 2016-11-04 NOTE — ED Notes (Signed)
Pt called nurse to room stating, "I need my metformin, my mouth is dry and I'm thirsty, why aren't ya'll treating me?" This nurse replied, you blood sugar was okay and the MD order is to administer metformin daily every morning  With breakfast, and you had your metformin this morning, also I can bring you as much water as you would like. Pt given water.

## 2016-11-04 NOTE — ED Notes (Signed)
Pt given 2 packs of crackers per request- informed pt that this was the last pack we had in the ED to give.

## 2016-11-04 NOTE — ED Notes (Signed)
Pt using third phone call at this time, notified pt this would be her last phone call today. Pt understands and still wishes to use phone call at this time.

## 2016-11-04 NOTE — BH Assessment (Addendum)
[place] Assessment Progress Note  Pt reassessed this afternoon. Pt reports feeling more "well collected" today. She denies  SI, HI. She says that she came in due to visions and hearing voices, and she admits to still experiencing visions and AH, but reports that they are "not to where I can't control them or filter them". Pt reports that she wants to go home.  Staffed case with Hughie Closs, NP. She continues to recommend IP treatment.   Kenna Gilbert. Lovena Le, [place], [place], [facility] Counselor

## 2016-11-04 NOTE — ED Notes (Signed)
Introduced self to Pt. Pt appears calm. Explained medications to Pt. Pt stated she did not want to receive any form of injection for her medications. Pt stated she wanted to explore Neurontin for pain. I explained to Pt Cogentin would assist her with extrapyramidal symptoms associated with her other medications and to stay hydrated to avoid constipation and difficulty with urination.

## 2016-11-04 NOTE — ED Notes (Signed)
Pt requesting blood sugar check- float nurse made aware of need for BS check

## 2016-11-04 NOTE — ED Notes (Signed)
Pt returned from shower at this time.

## 2016-11-04 NOTE — ED Notes (Signed)
Pt resting with eyes closed, appears to be in no distress. Respirations are even and unlabored. Sitter at bedside.  

## 2016-11-04 NOTE — ED Notes (Signed)
Called [employer] as requested by RN because Pt threatening to leave.

## 2016-11-04 NOTE — ED Notes (Signed)
Pt requesting to try Cymbalta. This RN contacted [facility] at this time to notify them pt is requesting to try a new medication. Per [facility] they do not have a bed available at this time and will re-TTS pt today.

## 2016-11-04 NOTE — ED Notes (Signed)
Pt requesting ibuprofen for right leg pain.

## 2016-11-04 NOTE — ED Notes (Signed)
Patient came out of the room and gave me a note to give to her nurse Daphyne. She wanted some meds for her neuropathy. Daphyne spoke with her.

## 2016-11-04 NOTE — ED Notes (Signed)
Upon entering room to give pt ibuprofen, pt starts rambling about she doesn't know if she should take it now because of her BP and the shot we gave her earlier. Informed pt that her BP was very good and she has had no adverse reaction to the haldol injection. Pt continues to argue with me. Pt asking "why am I still here in this hospital and why haven't they sent me to [place] yet" and "why can't I go home" Informed pt that we are waiting on a facility to accept her and then a bed that is available. Informed pt that the reason she is still here and cannot leave is because she has been involuntary committed by MD. This nurse asked pt, "do you want this medication that you requested or not" Pt responds "You are rude and I feel sorry for you". Pt given medication by nurse and nurse left room.

## 2016-11-04 NOTE — ED Notes (Signed)
Pt states she cannot take a shower because she will die if she does. Also asked to turn tv back on because Jesus told her she needs to watch tv all day.

## 2016-11-04 NOTE — ED Notes (Signed)
Tori Freight forwarder) report that pt is having paranoid thoughts.  See Tori's note.

## 2016-11-04 NOTE — ED Notes (Signed)
Pt requesting shower at this time. Security notified.

## 2016-11-04 NOTE — ED Notes (Signed)
Pt taken to shower and given new scrubs. Asking for cymbalta. Nurse made aware.

## 2016-11-04 NOTE — ED Notes (Signed)
Patient said she thinks she's pregnant.Talking to self in room. Notified Daphyne the nurse.

## 2016-11-04 NOTE — ED Notes (Signed)
Pt given dinner tray.

## 2016-11-04 NOTE — ED Notes (Signed)
Pt ate all of her lunch tray.

## 2016-11-04 NOTE — ED Notes (Signed)
Pt had wanted some Motrin to help her sleep, advise pt that it was to soon for Motrin since receiving it at 1653. Pt stated that she feels better since moving to another room. Will follow up later.

## 2016-11-04 NOTE — ED Notes (Signed)
Attempted to get vitals but patient refused.

## 2016-11-04 NOTE — ED Notes (Signed)
Pt requesting to use the phone at this time.

## 2016-11-05 ENCOUNTER — Encounter (HOSPITAL_COMMUNITY): Payer: Self-pay | Admitting: Registered Nurse

## 2016-11-05 DIAGNOSIS — F3110 Bipolar disorder, current episode manic without psychotic features, unspecified: Secondary | ICD-10-CM | POA: Diagnosis not present

## 2016-11-05 LAB — CBG MONITORING, ED: GLUCOSE-CAPILLARY: 84 mg/dL (ref 65–99)

## 2016-11-05 MED ORDER — IBUPROFEN 800 MG PO TABS: 800.0000 mg | ORAL_TABLET | Freq: Three times a day (TID) | ORAL | 0 refills | Status: DC | PRN

## 2016-11-05 NOTE — Progress Notes (Signed)
Per Earleen Newport, NP, the patient does not meet criteria for inpatient treatment. Patient reports to [facility] Provider that she is at her baseline and that she feels comfortable being discharged.  The patient is recommended for discharge and to follow up with [facility] in [place] for outpatient psychiatric services.   The patient is psychiatrically cleared at this time.  CSW spoke with the patient's nurse and requested that she give the patient the name, address and contact information for [facility].   Josiah Lobo, RN and Dr. Laverta Baltimore, [place] notified.   Radonna Ricker MSW, [place] Disposition [phone]

## 2016-11-05 NOTE — Care Management (Signed)
CM contacted by Rodman Pickle with [facility] - they were looking for Pt, family told caller pt was in the hospital. CM looked up patient to verify she was not admitted in our hospital. No further information given.
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
## 2016-11-05 NOTE — ED Notes (Signed)
Pt's transportation was contacted. Her belongings were returned to her. She denies SI/HI at this time. She understands her discharge. Pt ambulatory at discharge.
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
## 2016-11-05 NOTE — Discharge Instructions (Signed)
Substance Abuse Treatment Programs ° °Intensive Outpatient Programs °[facility]     °[address]      °[place], [place]                   °[phone]      ° °[facility] °[address] °[place], [place] °336-379-7146 ° °[facility]     °(Inpatient and outpatient)     °[address]           °336-832-9800   ° °[facility] °336-288-1484 (Suboxone and Methadone) ° °[address]      °[place], [place] [address]      °336-882-2125      ° °[address] °[place], [place] °852-3033 ° °[facility] (Outpatient/Inpatient, [facility])    °(insurance only) [phone]      °       °[facility] (Groups & Residential) °[place], [place] °336-389-1413 ° °   °[facility]     °[address]     °[place], [place]      °336-389-1413      ° °[facility] (for caregivers and family) °[address] °[place], [place] °336-299-4655 ° ° ° ° ° °Residential Treatment Programs °[facility]      °[address], [place], [place] [address]  °(336) [phone]      ° °[facility] °[address] [place], [place] [address] °[phone] ° °[facility]        °336-248-8914      ° °[facility] °1-800-659-3381 ° °[facility]             °[address]                                         °[place], [place]                                                °877-615-2722 or 336-784-9470                              ° °[facility] [place] °[address] °[place], [address] °1.877.941.8954 ° °[facility]    °[address]      °[place], [place]     °336-273-5306      ° °[facility] [place] [facility] °[address] °[place], [place] °336-285-9073 ° °[facility]   °[address]     °[place], [place] [address]     °336-899-1550      °Admissions: 8am-3pm M-F ° °[facility] °[address] °[place],  [place] °336-227-7417 ° °[facility]: Residential Program (90 Days)   °[place], [place]      °336-725-8389 or 800-758-6077    ° °[facility]: [facility] °[place], [place] °(Walk in Hours over the weekend or by referral) ° °[facility] °[address], [place],  [address] °(336) 723-1848 ° °Crisis Mobile: [facility]:  1-877-626-1772 (for crisis response 24 hours a day) °[facility] Hotline:      1-800-256-2452 °Outpatient Psychiatry and Counseling ° °[facility]: Mobile Crisis   Management 24 hours:  1-877-626-1772 ° °[facility] sliding scale fee and walk in schedule: M-F 8am-12pm/1pm-3pm °[address]  °[place], [place] [address] °336-387-6161 ° °[facility] °[address] °[place], [place] [address] °336-703-9650 ° °[facility] ([facility])- new patient walk-in appointments available Monday - Friday 8am -3pm.          °[address] °[place], [place] [address] °336-676-6840 or crisis line- 336-676-6905 ° °[facility]/ Intensive Outpatient Therapy Program °[address] °[place], [place] [address] °336-832-9804 ° °[facility]                  °Crisis Services      °336.641.4993      °[address]     °[name], [place] [address]                ° °[facility]   °[facility] °[phone] °[address] °[place], [place] [address] ° ° °[facility]          °[address],  °[place], [place] [address]       °(336) [phone] ° °[facility] °[address] °[place], [place] [address] °336-292-1510 ° °[facility]    °[address]    °[place], [place] [address]     °336-632-3505      ° °[facility]ish McKinney, [place]     °[address]     °[place] [address]     °336-282-1251     °  °[facility] °[address] °[place] [address] ° °[facility]     °[address]     °[place], [place]      °336-542-2076      ° °[facility] °Shamsher Ahluwalia, MD °[address] °[place], [place] [address] °336-420-9558 ° °[facility]     °[address]     °[place], [place] [address]     °336-274-1237      ° °[facility] °[address] °[place], [place] [address] °336-854-4450 °Resources for Temporary Residential Assistance/Crisis Centers ° °DAY CENTERS °[facility] °M-F 8am-3pm   °[address] [place], [place] [address]   336-332-0824 °Services include: laundry, barbering, support groups, case management, phone  & computer access, showers, AA/NA mtgs, mental health/substance abuse nurse, job skills class, disability information, VA assistance, spiritual classes, etc.  ° °HOMELESS SHELTERS ° °[facility]     °[facility]   °[address], [place]     °336.271.5959       °       °[facility] ([facility] children)       °[address] °[place], [place] [address] °336-275-0820 °[email or web address] for application and process °Application Required ° °[facility]   °[address]    °[place] [address]     °336.886.4922       °             °[facility] °[address] °[place], [place] [address] °336.273.5572 °336-235-0363(schedule application appt.) °Application Required ° °[facility] (women only)    °[address]     °[place], [place] [address]     °336-884-1039      °  Intake starts 6pm daily °Need valid ID, SSC, & Police report °[facility] [place] °[address] °[place], [place] °336-881-5420 °Application Required ° °[facility] (men only)     °[address]      °[place], [place]     °336.748.1962      ° °[facility] °(Pregnant women only) °[address] °[place], [place] °336-275-0206 ° °[facility]      °[address]      °[place], [place] [address]     °336-722-9951      °       °[facility] °[address] °[place], [place] °336-723-1848 °90 day commitment/SA/Application process ° °[facility](men only)     °[address]     °[place], [place]     °336-748-1962       °Check-in at 7pm     °       °[facility] °[address] °[place], [place] [address] °336-248-6684 °Men/Women/Women and Children must be there by 7 pm ° °[facility] °[place], [place] °336-722-8721                ° °

## 2016-11-05 NOTE — ED Provider Notes (Signed)
Blood pressure 105/67, pulse 83, temperature 98.2 F (36.8 C), temperature source Oral, resp. rate 16, weight 71.7 kg (158 lb), SpO2 100 %.  In short, [name] [name] is a 36 y.o. female with a chief complaint of V70.1 .  Refer to the original H&P for additional details.  12:08 PM Patient is feeling much better today.  Patient had a discussion with the psychiatry team who called to discuss the case with me.  They feel that the patient is safe for discharge and outpatient follow-up.  I discussed this with the patient who is in agreement.  She denies any suicidal or homicidal ideation. Will also refer to PCP with some chronic pain issues. No medication adjustments at this time. Plan to go to [facility] today to pickup prescriptions.   Nanda Quinton, MD    Margette Fast, MD 11/05/16 [phone]

## 2016-11-07 ENCOUNTER — Encounter (HOSPITAL_COMMUNITY): Payer: Self-pay | Admitting: Emergency Medicine

## 2016-11-07 ENCOUNTER — Emergency Department (HOSPITAL_COMMUNITY)
Admission: EM | Admit: 2016-11-07 | Discharge: 2016-11-07 | Disposition: A | Discharge status: Home / Self Care | Payer: Medicare Other | Attending: Emergency Medicine | Admitting: Emergency Medicine

## 2016-11-07 DIAGNOSIS — F209 Schizophrenia, unspecified: Secondary | ICD-10-CM | POA: Diagnosis not present

## 2016-11-07 DIAGNOSIS — F259 Schizoaffective disorder, unspecified: Secondary | ICD-10-CM | POA: Insufficient documentation

## 2016-11-07 DIAGNOSIS — F319 Bipolar disorder, unspecified: Secondary | ICD-10-CM | POA: Diagnosis not present

## 2016-11-07 DIAGNOSIS — Z7984 Long term (current) use of oral hypoglycemic drugs: Secondary | ICD-10-CM | POA: Insufficient documentation

## 2016-11-07 DIAGNOSIS — Z79899 Other long term (current) drug therapy: Secondary | ICD-10-CM | POA: Insufficient documentation

## 2016-11-07 DIAGNOSIS — F419 Anxiety disorder, unspecified: Secondary | ICD-10-CM | POA: Diagnosis not present

## 2016-11-07 DIAGNOSIS — F99 Mental disorder, not otherwise specified: Secondary | ICD-10-CM | POA: Diagnosis present

## 2016-11-07 DIAGNOSIS — E119 Type 2 diabetes mellitus without complications: Secondary | ICD-10-CM | POA: Diagnosis not present

## 2016-11-07 LAB — PREGNANCY, URINE: Preg Test, Ur: NEGATIVE

## 2016-11-07 LAB — BASIC METABOLIC PANEL
ANION GAP: 10 (ref 5–15)
BUN: 15 mg/dL (ref 6–20)
CALCIUM: 9.4 mg/dL (ref 8.9–10.3)
CO2: 25 mmol/L (ref 22–32)
Chloride: 102 mmol/L (ref 101–111)
Creatinine, Ser: 0.81 mg/dL (ref 0.44–1.00)
Glucose, Bld: 189 mg/dL — ABNORMAL HIGH (ref 65–99)
POTASSIUM: 3.7 mmol/L (ref 3.5–5.1)
Sodium: 137 mmol/L (ref 135–145)

## 2016-11-07 LAB — CBC
HCT: 35.4 % — ABNORMAL LOW (ref 36.0–46.0)
Hemoglobin: 12.1 g/dL (ref 12.0–15.0)
MCH: 32.8 pg (ref 26.0–34.0)
MCHC: 34.2 g/dL (ref 30.0–36.0)
MCV: 95.9 fL (ref 78.0–100.0)
PLATELETS: 216 10*3/uL (ref 150–400)
RBC: 3.69 MIL/uL — ABNORMAL LOW (ref 3.87–5.11)
RDW: 12.9 % (ref 11.5–15.5)
WBC: 5.3 10*3/uL (ref 4.0–10.5)

## 2016-11-07 LAB — ACETAMINOPHEN LEVEL: Acetaminophen (Tylenol), Serum: <10 ug/mL — ABNORMAL LOW (ref 10–30)

## 2016-11-07 LAB — RAPID URINE DRUG SCREEN, HOSP PERFORMED
Amphetamines: NOT DETECTED
Barbiturates: NOT DETECTED
Benzodiazepines: NOT DETECTED
Cocaine: NOT DETECTED
OPIATES: NOT DETECTED
TETRAHYDROCANNABINOL: NOT DETECTED

## 2016-11-07 LAB — SALICYLATE LEVEL: Salicylate Lvl: <7 mg/dL (ref 2.8–30.0)

## 2016-11-07 LAB — ETHANOL: Alcohol, Ethyl (B): <10 mg/dL (ref <10)

## 2016-11-07 MED ORDER — BENZTROPINE MESYLATE 1 MG PO TABS: 1.0000 mg | ORAL_TABLET | Freq: Every day | ORAL | Status: DC

## 2016-11-07 MED ORDER — HYDROXYZINE HCL 25 MG PO TABS: 50.0000 mg | ORAL_TABLET | Freq: Three times a day (TID) | ORAL | Status: DC | PRN

## 2016-11-07 MED ORDER — ACETAMINOPHEN 325 MG PO TABS: 650.0000 mg | ORAL_TABLET | Freq: Once | ORAL | Status: DC

## 2016-11-07 MED ORDER — ARIPIPRAZOLE 10 MG PO TABS
30.0000 mg | ORAL_TABLET | Freq: Every day | ORAL | Status: DC
  Filled 2016-11-07 (×4): qty 3

## 2016-11-07 MED ORDER — METFORMIN HCL 500 MG PO TABS: 500.0000 mg | ORAL_TABLET | Freq: Every day | ORAL | Status: DC

## 2016-11-07 MED ORDER — IBUPROFEN 800 MG PO TABS: 800.0000 mg | ORAL_TABLET | Freq: Four times a day (QID) | ORAL | Status: DC | PRN

## 2016-11-07 MED ORDER — TEMAZEPAM 7.5 MG PO CAPS: 7.5000 mg | ORAL_CAPSULE | Freq: Every day | ORAL | Status: DC

## 2016-11-07 MED ORDER — DIVALPROEX SODIUM 250 MG PO DR TAB: 250.0000 mg | DELAYED_RELEASE_TABLET | Freq: Three times a day (TID) | ORAL | Status: DC

## 2016-11-07 NOTE — Discharge Instructions (Signed)
Substance Abuse Treatment Programs ° °Intensive Outpatient Programs °[facility]     °[address]      °[place], [place]                   °336-878-6098      ° °[facility] °[address] °[place], [place] °336-379-7146 ° °[facility]     °(Inpatient and outpatient)     °[address]           °336-832-9800   ° °[facility] °336-288-1484 (Suboxone and Methadone) ° °[address]      °[place], [place] [address]      °336-882-2125      ° °[address] °[place], [place] °852-3033 ° °[facility] (Outpatient/Inpatient, [facility])    °(insurance only) 336-621-3381      °       °[facility] (Groups & Residential) °[place], [place] °336-389-1413 ° °   °[facility]     °[address]     °[place], [place]      °336-389-1413      ° °[facility] (for caregivers and family) °[address] °[place], [place] °336-299-4655 ° ° ° ° ° °Residential Treatment Programs °[facility]      °[address], [place], [place] [address]  °(336) 375-0900      ° °[facility] °[address] [place], [place] [address] °919-419-1059 ° °[facility]        °336-248-8914      ° °[facility] °1-800-659-3381 ° °[facility]             °[address]                                         °[place], [place]                                                °877-615-2722 or 336-784-9470                              ° °[facility] [place] °[address] °[place], [address] °1.877.941.8954 ° °[facility]    °[address]      °[place], [place]     °336-273-5306      ° °[facility] °[address] °, [place] °336-285-9073 ° °[facility]   °[address]     °[place], [place] [address]     °336-899-1550      °Admissions: 8am-3pm M-F ° °[facility] °[address] °[place] °336-227-7417 ° °[facility]: Residential Program (90 Days)   °[place],       °336-725-8389 or 800-758-6077    ° °[facility]: [facility] °[place],  °(Walk in Hours over the weekend or by referral) ° °[facility] °[address], [place],  [address] °(336) 723-1848 ° °Crisis Mobile: [facility]:  1-877-626-1772 (for crisis response 24 hours a day) °[facility] Hotline:      1-800-256-2452 °Outpatient Psychiatry and Counseling ° °[facility]: Mobile Crisis   Management 24 hours:  1-877-626-1772 ° °[facility] sliding scale fee and walk in schedule: M-F 8am-12pm/1pm-3pm °[address]  °[place], [place] [address] °336-387-6161 ° °[facility] °[address] °[place], [place] [address] °336-703-9650 ° °[facility] ([facility] known [facility])- new patient walk-in appointments available Monday - Friday 8am -3pm.          °[address] °[place], [place] [address] °336-676-6840 or crisis line- 336-676-6905 ° °[facility]/ Intensive Outpatient Therapy Program °[address] °[place], [place] [address] °336-832-9804 ° °[facility]                  °Crisis Services      °336.641.4993      °[address]     °[place], [place] [address]                ° °[facility]   °[facility] °800.525.9375 °[address] °[place], [place] [address] ° ° °[facility]          °[address]  °[place], [place] [address]       °(336) [phone] ° °[facility] °[address] °[place], [place] [address] °336-292-1510 ° °[facility]    °[address]    °[place], [place] [address]     °336-632-3505      ° °[facility]     °[address]     °[place] [address]     °336-282-1251     °  °[facility] °[address] °[place] [address] ° °[facility]     °[address]     °[place], [place]      °336-542-2076      ° °[facility] °Shamsher Ahluwalia, MD °[address] °[place], [place] [address] °336-420-9558 ° °[facility]     °[address]     °[place], [place] [address]     °336-274-1237      ° °[facility] °[address] °[place], [place] [address] °336-854-4450 °Resources for Temporary Residential Assistance/Crisis Centers ° °DAY CENTERS °[facility] °M-F 8am-3pm   °[address] [place], [place] [address]   336-332-0824 °Services include: laundry, barbering, support groups, case management, phone  & computer access, showers, AA/NA mtgs, mental health/substance abuse nurse, job skills class, disability information, VA assistance, spiritual classes, etc.  ° °HOMELESS SHELTERS ° °[facility]     °[facility]   °[address], [place]     °336.271.5959       °       °[facility] children)       °[address] °[place], [place] [address] °336-275-0820 °[email or web address] for application and process °Application Required ° °[facility]   °[address]    °[place] [address]     °336.886.4922       °             °[facility] °[address] °[place],  [address] °336.273.5572 °336-235-0363(schedule application appt.) °Application Required ° °[facility] (women only)    °[address]     °[place],  [address]     °336-884-1039      °  Intake starts 6pm daily °Need valid ID, SSC, & Police report °[facility] [place] °[address] °[place], [place] °336-881-5420 °Application Required ° °[facility] (men only)     °[address]      °[place], [place]     °336.748.1962      ° °[facility] °(Pregnant women only) °[address] °[place], [place] °336-275-0206 ° °[facility]      °[address]      °[place], [place] [address]     °336-722-9951      °       °[facility] °[address] °[place], [place] °336-723-1848 °90 day commitment/SA/Application process ° °[facility](men only)     °[address]     °[place], [place]     °336-748-1962       °Check-in at 7pm     °       °[facility] °[address] °[place], [place] [address] °336-248-6684 °Men/Women/Women and Children must be there by 7 pm ° °[facility] °[place], [place] °336-722-8721                ° °
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
## 2016-11-07 NOTE — [facility] Counselor (Signed)
Disposition: Per Earleen Newport, NP, patient does not meet inpatient criteria and meet criteria for discharged.
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
## 2016-11-07 NOTE — ED Notes (Signed)
Pt given a food tray.  States she read in the Bible she should not eat.

## 2016-11-07 NOTE — ED Triage Notes (Signed)
Pt states she does not like where she is living because people tell her what to do.  Is not taking her psych meds and is not going to the doctor because she does not know how.  Sees angels and hears them talking to her.  Denies SI/HI.  Thinks people are poisoning her.

## 2016-11-07 NOTE — ED Notes (Signed)
Pt refusing EKG at this time.  States she wants to go home and does not want to be made involuntary.  Advised pt she was waiting on evaluation by psychiatry before she could be released.  Pt became upset at this.  Speaking with her pastor on the phone.

## 2016-11-07 NOTE — ED Notes (Signed)
Pt awaiting ride.  Calm and cooperative at this time.

## 2016-11-07 NOTE — ED Notes (Signed)
Pt pacing around room.  States she is not here to be committed, but only to come off of all her medications.  Pt very paranoid with flight of ideas.

## 2016-11-24 ENCOUNTER — Encounter (HOSPITAL_COMMUNITY): Payer: Self-pay

## 2016-11-24 ENCOUNTER — Emergency Department (HOSPITAL_COMMUNITY)
Admission: EM | Admit: 2016-11-24 | Discharge: 2016-11-24 | Disposition: A | Discharge status: Home / Self Care | Payer: Medicare Other | Attending: Emergency Medicine | Admitting: Emergency Medicine

## 2016-11-24 ENCOUNTER — Other Ambulatory Visit: Payer: Self-pay

## 2016-11-24 DIAGNOSIS — Z760 Encounter for issue of repeat prescription: Secondary | ICD-10-CM | POA: Diagnosis present

## 2016-11-24 DIAGNOSIS — Z5321 Procedure and treatment not carried out due to patient leaving prior to being seen by health care provider: Secondary | ICD-10-CM | POA: Diagnosis not present

## 2016-11-24 LAB — CBG MONITORING, ED: GLUCOSE-CAPILLARY: 96 mg/dL (ref 65–99)

## 2016-11-24 NOTE — ED Notes (Addendum)
Patient given PCP paper work. States she was going to leave and follow up with PCP. Steady gait out of department.

## 2016-11-24 NOTE — ED Triage Notes (Signed)
Patient states she is out of her Metformin and needs filled. States she had not checked glucose due to no supplies. Denise any complaints. Also wants referral info to PCP.

## 2016-12-08 ENCOUNTER — Emergency Department (HOSPITAL_COMMUNITY)
Admission: EM | Admit: 2016-12-08 | Discharge: 2016-12-08 | Discharge status: Against Medical Advice | Payer: Medicare Other

## 2016-12-08 ENCOUNTER — Encounter: Payer: Self-pay | Admitting: Adult Health

## 2016-12-08 NOTE — ED Notes (Signed)
Registration called to inform pt informed them that she had decided to leave prior to triage.

## 2016-12-12 ENCOUNTER — Encounter: Payer: Self-pay | Admitting: Obstetrics and Gynecology

## 2016-12-23 DIAGNOSIS — F062 Psychotic disorder with delusions due to known physiological condition: Secondary | ICD-10-CM | POA: Diagnosis not present

## 2016-12-23 DIAGNOSIS — Z3202 Encounter for pregnancy test, result negative: Secondary | ICD-10-CM | POA: Diagnosis not present

## 2016-12-23 DIAGNOSIS — F919 Conduct disorder, unspecified: Secondary | ICD-10-CM | POA: Diagnosis not present

## 2016-12-23 DIAGNOSIS — F319 Bipolar disorder, unspecified: Secondary | ICD-10-CM | POA: Diagnosis not present

## 2017-01-03 DIAGNOSIS — M549 Dorsalgia, unspecified: Secondary | ICD-10-CM | POA: Diagnosis not present

## 2017-01-03 DIAGNOSIS — F319 Bipolar disorder, unspecified: Secondary | ICD-10-CM | POA: Diagnosis not present

## 2017-01-03 DIAGNOSIS — F919 Conduct disorder, unspecified: Secondary | ICD-10-CM | POA: Diagnosis not present

## 2017-01-03 DIAGNOSIS — R000 Tachycardia, unspecified: Secondary | ICD-10-CM | POA: Diagnosis not present

## 2017-01-03 DIAGNOSIS — M545 Low back pain: Secondary | ICD-10-CM | POA: Diagnosis not present

## 2017-01-05 DIAGNOSIS — E119 Type 2 diabetes mellitus without complications: Secondary | ICD-10-CM | POA: Diagnosis not present

## 2017-01-05 DIAGNOSIS — F319 Bipolar disorder, unspecified: Secondary | ICD-10-CM | POA: Diagnosis not present

## 2017-01-05 DIAGNOSIS — M545 Low back pain: Secondary | ICD-10-CM | POA: Diagnosis not present

## 2017-01-05 DIAGNOSIS — M25579 Pain in unspecified ankle and joints of unspecified foot: Secondary | ICD-10-CM | POA: Diagnosis not present

## 2017-01-06 DIAGNOSIS — F319 Bipolar disorder, unspecified: Secondary | ICD-10-CM | POA: Diagnosis not present

## 2017-01-06 DIAGNOSIS — M545 Low back pain: Secondary | ICD-10-CM | POA: Diagnosis not present

## 2017-01-06 DIAGNOSIS — M25579 Pain in unspecified ankle and joints of unspecified foot: Secondary | ICD-10-CM | POA: Diagnosis not present

## 2017-01-13 DIAGNOSIS — Z7982 Long term (current) use of aspirin: Secondary | ICD-10-CM | POA: Diagnosis not present

## 2017-01-13 DIAGNOSIS — Z7984 Long term (current) use of oral hypoglycemic drugs: Secondary | ICD-10-CM | POA: Diagnosis not present

## 2017-01-13 DIAGNOSIS — E119 Type 2 diabetes mellitus without complications: Secondary | ICD-10-CM | POA: Diagnosis not present

## 2017-01-13 DIAGNOSIS — J069 Acute upper respiratory infection, unspecified: Secondary | ICD-10-CM | POA: Diagnosis not present

## 2017-01-13 DIAGNOSIS — F319 Bipolar disorder, unspecified: Secondary | ICD-10-CM | POA: Diagnosis not present

## 2017-01-15 DIAGNOSIS — M25571 Pain in right ankle and joints of right foot: Secondary | ICD-10-CM | POA: Diagnosis not present

## 2017-01-15 DIAGNOSIS — M79671 Pain in right foot: Secondary | ICD-10-CM | POA: Diagnosis not present

## 2017-01-20 ENCOUNTER — Encounter (HOSPITAL_COMMUNITY): Payer: Self-pay | Admitting: Emergency Medicine

## 2017-01-20 ENCOUNTER — Emergency Department (HOSPITAL_COMMUNITY)
Admission: EM | Admit: 2017-01-20 | Discharge: 2017-01-20 | Disposition: A | Discharge status: Home / Self Care | Payer: Medicare HMO | Attending: Emergency Medicine | Admitting: Emergency Medicine

## 2017-01-20 ENCOUNTER — Other Ambulatory Visit: Payer: Self-pay

## 2017-01-20 DIAGNOSIS — F319 Bipolar disorder, unspecified: Secondary | ICD-10-CM | POA: Diagnosis not present

## 2017-01-20 DIAGNOSIS — Z5321 Procedure and treatment not carried out due to patient leaving prior to being seen by health care provider: Secondary | ICD-10-CM | POA: Insufficient documentation

## 2017-01-20 DIAGNOSIS — F99 Mental disorder, not otherwise specified: Secondary | ICD-10-CM | POA: Diagnosis not present

## 2017-01-20 DIAGNOSIS — R442 Other hallucinations: Secondary | ICD-10-CM | POA: Insufficient documentation

## 2017-01-20 LAB — CBG MONITORING, ED: GLUCOSE-CAPILLARY: 99 mg/dL (ref 65–99)
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
## 2017-01-20 NOTE — ED Notes (Signed)
PT states she wants to leave and go back home with a taxi cab voucher. PT asked if she could find another ride and she stated she could call a guy that she lives with and he would come get her. PT denies any hallucinations at this time and denies any SI/HI. PT left to go back home at this time.

## 2017-01-21 ENCOUNTER — Other Ambulatory Visit: Payer: Self-pay

## 2017-01-21 ENCOUNTER — Encounter (HOSPITAL_COMMUNITY): Payer: Self-pay | Admitting: *Deleted

## 2017-01-21 ENCOUNTER — Emergency Department (HOSPITAL_COMMUNITY)
Admission: EM | Admit: 2017-01-21 | Discharge: 2017-01-21 | Disposition: A | Discharge status: Other Acute Inpatient Hospital | Payer: Medicare HMO | Attending: Emergency Medicine | Admitting: Emergency Medicine

## 2017-01-21 DIAGNOSIS — F250 Schizoaffective disorder, bipolar type: Secondary | ICD-10-CM | POA: Diagnosis not present

## 2017-01-21 DIAGNOSIS — Z79899 Other long term (current) drug therapy: Secondary | ICD-10-CM | POA: Diagnosis not present

## 2017-01-21 DIAGNOSIS — E119 Type 2 diabetes mellitus without complications: Secondary | ICD-10-CM | POA: Insufficient documentation

## 2017-01-21 DIAGNOSIS — F29 Unspecified psychosis not due to a substance or known physiological condition: Secondary | ICD-10-CM | POA: Insufficient documentation

## 2017-01-21 DIAGNOSIS — R443 Hallucinations, unspecified: Secondary | ICD-10-CM | POA: Diagnosis present

## 2017-01-21 DIAGNOSIS — Z9119 Patient's noncompliance with other medical treatment and regimen: Secondary | ICD-10-CM | POA: Diagnosis not present

## 2017-01-21 DIAGNOSIS — Z9114 Patient's other noncompliance with medication regimen: Secondary | ICD-10-CM | POA: Insufficient documentation

## 2017-01-21 DIAGNOSIS — Z046 Encounter for general psychiatric examination, requested by authority: Secondary | ICD-10-CM | POA: Diagnosis not present

## 2017-01-21 DIAGNOSIS — Z7984 Long term (current) use of oral hypoglycemic drugs: Secondary | ICD-10-CM | POA: Insufficient documentation

## 2017-01-21 DIAGNOSIS — F419 Anxiety disorder, unspecified: Secondary | ICD-10-CM | POA: Insufficient documentation

## 2017-01-21 DIAGNOSIS — G479 Sleep disorder, unspecified: Secondary | ICD-10-CM | POA: Diagnosis not present

## 2017-01-21 DIAGNOSIS — R450 Nervousness: Secondary | ICD-10-CM | POA: Diagnosis not present

## 2017-01-21 LAB — COMPREHENSIVE METABOLIC PANEL
ALBUMIN: 3.8 g/dL (ref 3.5–5.0)
ALT: 17 U/L (ref 14–54)
ANION GAP: 9 (ref 5–15)
AST: 24 U/L (ref 15–41)
Alkaline Phosphatase: 44 U/L (ref 38–126)
BILIRUBIN TOTAL: 0.5 mg/dL (ref 0.3–1.2)
BUN: 9 mg/dL (ref 6–20)
CHLORIDE: 108 mmol/L (ref 101–111)
CO2: 21 mmol/L — AB (ref 22–32)
Calcium: 9 mg/dL (ref 8.9–10.3)
Creatinine, Ser: 0.77 mg/dL (ref 0.44–1.00)
GFR calc Af Amer: >60 mL/min (ref >60)
GFR calc non Af Amer: >60 mL/min (ref >60)
GLUCOSE: 169 mg/dL — AB (ref 65–99)
POTASSIUM: 3.4 mmol/L — AB (ref 3.5–5.1)
SODIUM: 138 mmol/L (ref 135–145)
TOTAL PROTEIN: 7.4 g/dL (ref 6.5–8.1)

## 2017-01-21 LAB — URINALYSIS, ROUTINE W REFLEX MICROSCOPIC
BILIRUBIN URINE: NEGATIVE
GLUCOSE, UA: NEGATIVE mg/dL
Hgb urine dipstick: NEGATIVE
KETONES UR: NEGATIVE mg/dL
Leukocytes, UA: NEGATIVE
NITRITE: NEGATIVE
PH: 7 (ref 5.0–8.0)
Protein, ur: NEGATIVE mg/dL
SPECIFIC GRAVITY, URINE: 1.013 (ref 1.005–1.030)

## 2017-01-21 LAB — RAPID URINE DRUG SCREEN, HOSP PERFORMED
AMPHETAMINES: NOT DETECTED
BARBITURATES: NOT DETECTED
BENZODIAZEPINES: NOT DETECTED
Cocaine: NOT DETECTED
Opiates: NOT DETECTED
TETRAHYDROCANNABINOL: NOT DETECTED

## 2017-01-21 LAB — ETHANOL

## 2017-01-21 LAB — CBC
HEMATOCRIT: 32.7 % — AB (ref 36.0–46.0)
Hemoglobin: 11.1 g/dL — ABNORMAL LOW (ref 12.0–15.0)
MCH: 32.1 pg (ref 26.0–34.0)
MCHC: 33.9 g/dL (ref 30.0–36.0)
MCV: 94.5 fL (ref 78.0–100.0)
PLATELETS: 230 10*3/uL (ref 150–400)
RBC: 3.46 MIL/uL — ABNORMAL LOW (ref 3.87–5.11)
RDW: 12.3 % (ref 11.5–15.5)
WBC: 5.9 10*3/uL (ref 4.0–10.5)

## 2017-01-21 LAB — PREGNANCY, URINE: Preg Test, Ur: NEGATIVE

## 2017-01-21 LAB — CBG MONITORING, ED
GLUCOSE-CAPILLARY: 78 mg/dL (ref 65–99)
Glucose-Capillary: 72 mg/dL (ref 65–99)

## 2017-01-21 LAB — I-STAT BETA HCG BLOOD, ED (MC, WL, AP ONLY)

## 2017-01-21 MED ORDER — STERILE WATER FOR INJECTION IJ SOLN
INTRAMUSCULAR | Status: AC
  Administered 2017-01-21: 1.2 mL
  Filled 2017-01-21: qty 10

## 2017-01-21 MED ORDER — OLANZAPINE 5 MG PO TBDP: 10.0000 mg | ORAL_TABLET | Freq: Three times a day (TID) | ORAL | Status: DC | PRN

## 2017-01-21 MED ORDER — METFORMIN HCL 500 MG PO TABS: 500.0000 mg | ORAL_TABLET | Freq: Every day | ORAL | Status: DC

## 2017-01-21 MED ORDER — ZIPRASIDONE MESYLATE 20 MG IM SOLR
20.0000 mg | INTRAMUSCULAR | Status: AC | PRN
  Administered 2017-01-21: 20 mg via INTRAMUSCULAR
  Filled 2017-01-21: qty 20

## 2017-01-21 MED ORDER — BENZTROPINE MESYLATE 1 MG PO TABS: 1.0000 mg | ORAL_TABLET | Freq: Every day | ORAL | Status: DC

## 2017-01-21 MED ORDER — LORAZEPAM 1 MG PO TABS: 1.0000 mg | ORAL_TABLET | ORAL | Status: DC | PRN

## 2017-01-21 MED ORDER — HYDROXYZINE HCL 50 MG/ML IM SOLN
100.0000 mg | Freq: Once | INTRAMUSCULAR | Status: AC
  Administered 2017-01-21: 100 mg via INTRAMUSCULAR
  Filled 2017-01-21: qty 2

## 2017-01-21 MED ORDER — ARIPIPRAZOLE 10 MG PO TABS
30.0000 mg | ORAL_TABLET | Freq: Every day | ORAL | Status: DC
  Administered 2017-01-21: 30 mg via ORAL
  Filled 2017-01-21: qty 3
  Filled 2017-01-21: qty 2
  Filled 2017-01-21: qty 3

## 2017-01-21 MED ORDER — INSULIN ASPART 100 UNIT/ML ~~LOC~~ SOLN: 0.0000 [IU] | Freq: Three times a day (TID) | SUBCUTANEOUS | Status: DC

## 2017-01-21 MED ORDER — LORAZEPAM 2 MG/ML IJ SOLN
2.0000 mg | Freq: Once | INTRAMUSCULAR | Status: AC
  Administered 2017-01-21: 2 mg via INTRAMUSCULAR
  Filled 2017-01-21: qty 1

## 2017-01-21 MED ORDER — ZOLPIDEM TARTRATE 5 MG PO TABS: 5.0000 mg | ORAL_TABLET | Freq: Every evening | ORAL | Status: DC | PRN

## 2017-01-21 MED ORDER — ONDANSETRON HCL 4 MG PO TABS: 4.0000 mg | ORAL_TABLET | Freq: Three times a day (TID) | ORAL | Status: DC | PRN

## 2017-01-21 MED ORDER — ACETAMINOPHEN 325 MG PO TABS: 650.0000 mg | ORAL_TABLET | ORAL | Status: DC | PRN

## 2017-01-21 NOTE — ED Notes (Signed)
Lunch tray provided at this time

## 2017-01-21 NOTE — ED Notes (Signed)
Pt out of her room screaming she needs a power of attorney now. Pt advised that she needed to go back in her room. Pt is now in her room laying in the bed.

## 2017-01-21 NOTE — ED Notes (Addendum)
Patient is hollering while sitting in room. Stated she had to go the bathroom we advised her that there was someone in the bathroom. When she went to use the bathroom she wouldn't shut the door. She didn't care who saw her and she wasn't going to lock the door. Security is present and the nurse is aware.

## 2017-01-21 NOTE — ED Notes (Signed)
Pt resting with eyes closed, appears to be in no distress. Respirations are even and unlabored. Sitter at bedside.  

## 2017-01-21 NOTE — ED Notes (Signed)
Pt irate at this nurse, yelling and screaming, being delusional, and stating we have killed her. Pt given geodon, security, nurses, and [employer] at bedside for nurses safety.
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
## 2017-01-21 NOTE — ED Notes (Addendum)
Per Crystal, therapist at [facility] states that the pt meets inpatient criteria and are currently looking for bed placement.
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
## 2017-01-21 NOTE — ED Notes (Signed)
Pt verbally aggressive and shouting with staff, [employer] at bedside attempting to calm and re-direct pt. She continues to get loud and verbally aggressive with staff. IM Ativan given, will hold Vistaril at this time if pt will remain calm. [employer] still at bedside.

## 2017-01-21 NOTE — ED Notes (Signed)
Pt resting at this time with eyes closed, respirations even and unlabored.
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
## 2017-01-21 NOTE — ED Notes (Signed)
[employer] is present

## 2017-01-21 NOTE — ED Provider Notes (Signed)
The patient is much calmer at this time.  Earlier she had decompensated, was yelling, irrational and required chemical restraint.  At this time she is standing in the doorway, asked me to come in and talk to me in a respectful manner.  She stated that she needed to 'leave immediately."  I explained that that was not possible as she was involuntarily committed.   Daleen Bo, MD 01/21/17 [phone]
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
## 2017-01-21 NOTE — ED Notes (Signed)
Pt ate crackers and ambulated around nurses station with steady and even gait.

## 2017-01-21 NOTE — ED Notes (Signed)
Pt awoke and asking for food. Will ambulate pt around nurses station.

## 2017-01-21 NOTE — ED Notes (Signed)
telepsych is progress

## 2017-01-21 NOTE — Progress Notes (Signed)
Patient meets criteria for inpatient treatment. CSW faxed referrals to the following inpatient facilities for review:   [facility], [facility], [facility], [place], [place], [place], [place], [place], [facility], [place]    TTS will continue to seek bed placement.   Radonna Ricker, MSW, [place] Clinical Social Worker (Disposition) [facility]  [phone]

## 2017-01-21 NOTE — ED Notes (Signed)
Pt states she is here voluntarily and wants to leave. Pt advised that she is being involuntarily committed at this time. Pt made aware that she had a sitter now and not the avsyst.

## 2017-01-21 NOTE — ED Notes (Addendum)
Pt out in the hallway and went to the restroom and would not close the door. Pt screaming and yelling while on the toilet. Pt had to be escorted back to her room by security and [employer]. Pt continues to say that we are all "going to hell."

## 2017-01-21 NOTE — ED Triage Notes (Signed)
Pt brought in by ems for complaints of having dreams about people trying to hurt her; pt states since she is awake she is still afraid someone may come and hurt her

## 2017-01-21 NOTE — Progress Notes (Addendum)
Accepted to [facility] in [place], [place].   Accepting and attending physician is Retta Diones.  Assigned to the [facility] Nurse to Nurse report number is [phone] Patient can transport to facility at anytime, the bed is ready.    Anderson Malta, RN agreed to inform the patient's assigned nurse,  Josiah Lobo, RN.    Radonna Ricker, MSW, [place] Clinical Social Worker (Disposition) [facility]  [phone]

## 2017-01-21 NOTE — ED Notes (Signed)
Pt made aware of transfer. Pt's belongings are in [place] officer's patrol car. Packet of paperwork provided to [employer]. Pt to restroom at this time.

## 2017-01-21 NOTE — ED Notes (Signed)
Pt sleeping now and law enforcement were signed off with the understanding that if the pt continues to "show out" and be uncontrollable, they would have to come back and sit with the pt and not be signed off again.
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
## 2017-02-08 ENCOUNTER — Emergency Department (HOSPITAL_COMMUNITY)
Admission: EM | Admit: 2017-02-08 | Discharge: 2017-02-08 | Disposition: A | Discharge status: Home / Self Care | Payer: Medicare HMO | Attending: Emergency Medicine | Admitting: Emergency Medicine

## 2017-02-08 ENCOUNTER — Emergency Department (HOSPITAL_COMMUNITY): Payer: Medicare HMO

## 2017-02-08 ENCOUNTER — Encounter (HOSPITAL_COMMUNITY): Payer: Self-pay

## 2017-02-08 DIAGNOSIS — E119 Type 2 diabetes mellitus without complications: Secondary | ICD-10-CM | POA: Diagnosis not present

## 2017-02-08 DIAGNOSIS — B349 Viral infection, unspecified: Secondary | ICD-10-CM | POA: Insufficient documentation

## 2017-02-08 DIAGNOSIS — J069 Acute upper respiratory infection, unspecified: Secondary | ICD-10-CM | POA: Diagnosis not present

## 2017-02-08 DIAGNOSIS — Z7984 Long term (current) use of oral hypoglycemic drugs: Secondary | ICD-10-CM | POA: Diagnosis not present

## 2017-02-08 DIAGNOSIS — B9789 Other viral agents as the cause of diseases classified elsewhere: Secondary | ICD-10-CM

## 2017-02-08 DIAGNOSIS — Z79899 Other long term (current) drug therapy: Secondary | ICD-10-CM | POA: Diagnosis not present

## 2017-02-08 DIAGNOSIS — R05 Cough: Secondary | ICD-10-CM | POA: Diagnosis not present

## 2017-02-08 DIAGNOSIS — J029 Acute pharyngitis, unspecified: Secondary | ICD-10-CM | POA: Diagnosis not present

## 2017-02-08 LAB — URINALYSIS, ROUTINE W REFLEX MICROSCOPIC
BILIRUBIN URINE: NEGATIVE
GLUCOSE, UA: NEGATIVE mg/dL
HGB URINE DIPSTICK: NEGATIVE
Ketones, ur: NEGATIVE mg/dL
Leukocytes, UA: NEGATIVE
Nitrite: NEGATIVE
PH: 5 (ref 5.0–8.0)
Protein, ur: NEGATIVE mg/dL
SPECIFIC GRAVITY, URINE: 1.019 (ref 1.005–1.030)

## 2017-02-08 LAB — POC URINE PREG, ED: Preg Test, Ur: NEGATIVE

## 2017-02-08 MED ORDER — MONTELUKAST SODIUM 10 MG PO TABS: 10.0000 mg | ORAL_TABLET | Freq: Every day | ORAL | 0 refills | Status: DC

## 2017-02-08 MED ORDER — FLUTICASONE PROPIONATE 50 MCG/ACT NA SUSP: 2.0000 | Freq: Every day | NASAL | 2 refills | Status: DC

## 2017-02-08 MED ORDER — PROMETHAZINE-DM 6.25-15 MG/5ML PO SYRP: 5.0000 mL | ORAL_SOLUTION | Freq: Three times a day (TID) | ORAL | 0 refills | Status: DC | PRN

## 2017-02-08 NOTE — ED Triage Notes (Addendum)
Pt reports cough x 1 month but started being productive 2 days ago.  Reports sore throat and back pain.  Pt also requesting pregnancy test.  Pt also requesting something for frequent urination.  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

## 2017-02-08 NOTE — ED Provider Notes (Signed)
[facility] EMERGENCY DEPARTMENT Provider Note   CSN: [id number] Arrival date & time: 02/08/17  0347     History   Chief Complaint Chief Complaint  Patient presents with  . Cough    HPI [name] [name] is a 37 y.o. female.  HPI  The patient is a 37 year old female who has a history of she has a history of schizophrenia, she denies taking any daily medications at all.  She reports to me that she is currently living with her friend in this area, but for the last month she has been having coughing which was initially dry but now is productive of some mucus.  She does endorse having a flu shot within the last 2 months.  Additionally the patient has complained of a runny nose and this morning woke up with a sore throat which she thinks may be because she was singing in church yesterday.  The patient states that she has recently been evaluated from a mental health standpoint because of her history of schizophrenia, she was released and states that she feels better with regards to her mental health at this time.  She does not know why she continues to cough, she denies tobacco use, her significant other who is here with her as well has also been seen in the emergency department for coughing.  She does report an intolerance to multiple medications including many antipsychotics as well as a history of an intolerance to [employer]  She does comment on bilateral foot pain and lower extremity pain which she states is been going on for months and for which she states she has been extensively evaluated without any etiologies found  Past Medical History:  Diagnosis Date  . Bipolar affective disorder, currently manic, mild ([place])   . Diabetes mellitus without complication ([place])   . Schizophrenia [facility])     Patient Active Problem List   Diagnosis Date Noted  . Insomnia   . Anxiety state   . Overactive bladder   . Diabetes mellitus ([place]) 02/08/2015  . Schizoaffective disorder, bipolar  type ([place]) 01/28/2015  . Non compliance w medication regimen     Past Surgical History:  Procedure Laterality Date  . WISDOM TOOTH EXTRACTION      OB History    No data available       Home Medications    Prior to Admission medications   Medication Sig Start Date End Date Taking? Authorizing Provider  ARIPiprazole (ABILIFY) 30 MG tablet Take 30 mg by mouth daily.    [provider]  benztropine (COGENTIN) 1 MG tablet Take 1 tablet (1 mg total) by mouth at bedtime. For prevention of drug induced tremors 10/31/16   Lindell Spar I, NP  fluticasone (FLONASE) 50 MCG/ACT nasal spray Place 2 sprays into both nostrils daily. 02/08/17   Noemi Chapel, MD  hydrOXYzine (ATARAX/VISTARIL) 50 MG tablet Take 1 tablet (50 mg total) by mouth 3 (three) times daily as needed for anxiety. 10/31/16   Lindell Spar I, NP  metFORMIN (GLUCOPHAGE) 500 MG tablet Take 1 tablet (500 mg total) by mouth daily with breakfast. For diabetes management 11/01/16   Lindell Spar I, NP  montelukast (SINGULAIR) 10 MG tablet Take 1 tablet (10 mg total) by mouth at bedtime. 02/08/17 03/10/17  Noemi Chapel, MD  Multiple Vitamin (MULTIVITAMIN) tablet Take 1 tablet by mouth daily.    [provider]  oxybutynin (DITROPAN-XL) 10 MG 24 hr tablet Take 1 tablet (10 mg total) by mouth at bedtime. For  over active bladder 10/31/16   Lindell Spar I, NP  promethazine-dextromethorphan (PROMETHAZINE-DM) 6.25-15 MG/5ML syrup Take 5 mLs by mouth 3 (three) times daily as needed for cough. 02/08/17   Noemi Chapel, MD  temazepam (RESTORIL) 7.5 MG capsule Take 1 capsule (7.5 mg total) by mouth at bedtime. For sleep 10/31/16   Lindell Spar I, NP    Family History Family History  Problem Relation Age of Onset  . Drug abuse Maternal Uncle     Social History Social History   Tobacco Use  . Smoking status: Never Smoker  . Smokeless tobacco: Never Used  Substance Use Topics  . Alcohol use: No  . Drug use: No     Allergies    Patient has no known allergies.   Review of Systems Review of Systems  Constitutional: Negative for chills and fever.  HENT: Positive for congestion, rhinorrhea and sore throat.   Eyes: Negative for pain and redness.  Respiratory: Positive for cough. Negative for shortness of breath and wheezing.   Cardiovascular: Negative for chest pain and leg swelling.  Gastrointestinal: Negative for diarrhea, nausea and vomiting.  Genitourinary: Negative for dysuria.  Musculoskeletal: Negative for back pain and joint swelling.  Skin: Negative for rash.  Neurological: Negative for headaches.  Hematological: Negative for adenopathy.  Psychiatric/Behavioral: Negative for agitation.     Physical Exam Updated Vital Signs Ht 5\' 4"  (1.626 m)   Wt 72.6 kg (160 lb)   LMP  (Within Months)   BMI 27.46 kg/m   Physical Exam  Constitutional: She appears well-developed and well-nourished. No distress.  HENT:  Head: Normocephalic and atraumatic.  Mouth/Throat: No oropharyngeal exudate.  Mild erythema of the posterior pharynx, clear rhinorrhea present in the bilateral nares  Eyes: Conjunctivae and EOM are normal. Pupils are equal, round, and reactive to light. Right eye exhibits no discharge. Left eye exhibits no discharge. No scleral icterus.  Conjunctive are clear with no signs of injection, inflammation or jaundice, pupils are equal round and reactive to light  Neck: Normal range of motion. Neck supple. No JVD present. No thyromegaly present.  Supple neck with no lymphadenopathy, no trismus or torticollis  Cardiovascular: Normal rate, regular rhythm, normal heart sounds and intact distal pulses. Exam reveals no gallop and no friction rub.  No murmur heard. Normal heart rate, normal heart sounds, no murmurs rubs or gallops, normal pulses at the radial arteries  Pulmonary/Chest: Effort normal and breath sounds normal. No respiratory distress. She has no wheezes. She has no rales.  Lung sounds are  clear to auscultation, she is able to breathe deeply in all lung fields are clear, no increased work of breathing, speaks in full sentences  Musculoskeletal: Normal range of motion. She exhibits no edema or tenderness.  Lymphadenopathy:    She has no cervical adenopathy.  Neurological: She is alert. Coordination normal.  Skin: Skin is warm and dry. No rash noted. No erythema.  Psychiatric: She has a normal mood and affect. Her behavior is normal.  Nursing note and vitals reviewed.    ED Treatments / Results  Labs (all labs ordered are listed, but only abnormal results are displayed) Labs Reviewed  URINALYSIS, [place] PREG, ED     Radiology Dg Chest 2 View  Result Date: 02/08/2017 CLINICAL DATA:  Cough x1 month, fever EXAM: CHEST  2 VIEW COMPARISON:  06/13/2015 FINDINGS: Lungs are clear.  No pleural effusion or pneumothorax. The heart is normal in size. Visualized osseous structures are  within normal limits. IMPRESSION: Normal chest radiographs. Electronically Signed   By: Julian Hy M.D.   On: 02/08/2017 07:41    Procedures Procedures (including critical care time)  Medications Ordered in ED Medications - No data to display   Initial Impression / Assessment and Plan / ED Course  I have reviewed the triage vital signs and the nursing notes.  Pertinent labs & imaging results that were available during my care of the patient were reviewed by me and considered in my medical decision making (see chart for details).     Ongoing coughing, etiology is likely some combination of viral versus allergic given the length of time she has had symptoms, her new living situation around animals and his significant other who has had similar cough.  Chest x-ray, rule out pneumonia, suspect this is benign and anticipate benefit with Singulair, Flonase and anti-inflammatories as needed for aches and pains.  I have personally seen the 2 view PA and lateral  chest No signs of infection on my interpretation Pt stable for d/c.  Final Clinical Impressions(s) / ED Diagnoses   Final diagnoses:  Viral URI with cough    ED Discharge Orders        Ordered    montelukast (SINGULAIR) 10 MG tablet  Daily at bedtime     02/08/17 0748    fluticasone (FLONASE) 50 MCG/ACT nasal spray  Daily     02/08/17 0748    promethazine-dextromethorphan (PROMETHAZINE-DM) 6.25-15 MG/5ML syrup  3 times daily PRN     02/08/17 0748       Noemi Chapel, MD 02/08/17 [phone]

## 2017-02-08 NOTE — Discharge Instructions (Signed)
Your xray is normal  You may start taking Singulair (Montelukast) for this as well  Fluticasone nasal spray in the morning  Promethazine / DM cough syrup as needed 3 times daily for coughing  Tylenol / Motrin for aches or pains  Please obtain all of your results from medical records or have your doctors office obtain the results - share them with your doctor - you should be seen at your doctors office in the next 2 days. Call today to arrange your follow up. Take the medications as prescribed. Please review all of the medicines and only take them if you do not have an allergy to them. Please be aware that if you are taking birth control pills, taking other prescriptions, ESPECIALLY ANTIBIOTICS may make the birth control ineffective - if this is the case, either do not engage in sexual activity or use alternative methods of birth control such as condoms until you have finished the medicine and your family doctor says it is OK to restart them. If you are on a blood thinner such as COUMADIN, be aware that any other medicine that you take may cause the coumadin to either work too much, or not enough - you should have your coumadin level rechecked in next 7 days if this is the case.  ?  It is also a possibility that you have an allergic reaction to any of the medicines that you have been prescribed - Everybody reacts differently to medications and while MOST people have no trouble with most medicines, you may have a reaction such as nausea, vomiting, rash, swelling, shortness of breath. If this is the case, please stop taking the medicine immediately and contact your physician.  ?  You should return to the ER if you develop severe or worsening symptoms.     [facility] Primary Care Doctor List    Sinda Du MD. Specialty: Pulmonary Disease Contact information: [address]  [place] [address]  [phone]   Tula Nakayama, MD. Specialty: [facility] Medicine Contact  information: [address], [place] [address]  [phone]   Sallee Lange, MD. Specialty: [facility] Medicine Contact information: [place]  [place] [address]  [phone]   Rosita Fire, MD Specialty: Internal Medicine Contact information: [place] [address]  [phone]   Delphina Cahill, MD. Specialty: Internal Medicine Contact information: [place] [address]  [phone]    [facility] (Dr. Maudie Mercury) Specialty: Family Medicine Contact information: [place] [address]  [phone]   Leslie Andrea, MD. Specialty: [facility] Medicine Contact information: [place] [address]  [phone]   Asencion Noble, MD. Specialty: Internal Medicine Contact information: [place] [address]  [place] [address]  [place]  [address] [place], [place] [address] [phone]  Services [facility] offers a variety of basic health services.  Services include but are not limited to: Blood pressure checks  Heart rate checks  Blood sugar checks  Urine analysis  Rapid strep tests  Pregnancy tests.  Health education and referrals  People needing more complex services will be directed to a physician online. Using these virtual visits, doctors can evaluate and prescribe medicine and treatments. There will be no medication on-site, though  [facility] will help patients fill their prescriptions at little to no cost.   For More information please go to: [email or web address]

## 2017-02-18 ENCOUNTER — Encounter (HOSPITAL_COMMUNITY): Payer: Self-pay | Admitting: *Deleted

## 2017-02-18 ENCOUNTER — Emergency Department (HOSPITAL_COMMUNITY)
Admission: EM | Admit: 2017-02-18 | Discharge: 2017-02-18 | Disposition: A | Discharge status: Other Acute Inpatient Hospital | Payer: Medicare HMO | Attending: Emergency Medicine | Admitting: Emergency Medicine

## 2017-02-18 ENCOUNTER — Other Ambulatory Visit: Payer: Self-pay

## 2017-02-18 DIAGNOSIS — N3281 Overactive bladder: Secondary | ICD-10-CM | POA: Diagnosis not present

## 2017-02-18 DIAGNOSIS — Z7984 Long term (current) use of oral hypoglycemic drugs: Secondary | ICD-10-CM | POA: Diagnosis not present

## 2017-02-18 DIAGNOSIS — F312 Bipolar disorder, current episode manic severe with psychotic features: Secondary | ICD-10-CM

## 2017-02-18 DIAGNOSIS — Z7901 Long term (current) use of anticoagulants: Secondary | ICD-10-CM | POA: Insufficient documentation

## 2017-02-18 DIAGNOSIS — F250 Schizoaffective disorder, bipolar type: Secondary | ICD-10-CM | POA: Diagnosis not present

## 2017-02-18 DIAGNOSIS — E119 Type 2 diabetes mellitus without complications: Secondary | ICD-10-CM | POA: Diagnosis not present

## 2017-02-18 DIAGNOSIS — F99 Mental disorder, not otherwise specified: Secondary | ICD-10-CM | POA: Diagnosis present

## 2017-02-18 DIAGNOSIS — Z9114 Patient's other noncompliance with medication regimen: Secondary | ICD-10-CM | POA: Diagnosis not present

## 2017-02-18 LAB — RAPID URINE DRUG SCREEN, HOSP PERFORMED
Amphetamines: NOT DETECTED
BARBITURATES: NOT DETECTED
Benzodiazepines: NOT DETECTED
COCAINE: NOT DETECTED
Opiates: NOT DETECTED
TETRAHYDROCANNABINOL: NOT DETECTED

## 2017-02-18 LAB — CBG MONITORING, ED: GLUCOSE-CAPILLARY: 75 mg/dL (ref 65–99)

## 2017-02-18 LAB — PREGNANCY, URINE: Preg Test, Ur: NEGATIVE

## 2017-02-18 MED ORDER — ACETAMINOPHEN 325 MG PO TABS
650.0000 mg | ORAL_TABLET | ORAL | Status: DC | PRN
Start: 2017-02-18 — End: 2017-02-18

## 2017-02-18 MED ORDER — OXYBUTYNIN CHLORIDE ER 5 MG PO TB24
10.0000 mg | ORAL_TABLET | Freq: Every day | ORAL | Status: DC
  Filled 2017-02-18 (×2): qty 2

## 2017-02-18 MED ORDER — METFORMIN HCL 500 MG PO TABS
500.0000 mg | ORAL_TABLET | Freq: Every day | ORAL | Status: DC
  Filled 2017-02-18: qty 1

## 2017-02-18 MED ORDER — ADULT MULTIVITAMIN W/MINERALS CH: 1.0000 | ORAL_TABLET | Freq: Every day | ORAL | Status: DC

## 2017-02-18 MED ORDER — ONDANSETRON HCL 4 MG PO TABS: 4.0000 mg | ORAL_TABLET | Freq: Three times a day (TID) | ORAL | Status: DC | PRN

## 2017-02-18 MED ORDER — MONTELUKAST SODIUM 10 MG PO TABS: 10.0000 mg | ORAL_TABLET | Freq: Every day | ORAL | Status: DC

## 2017-02-18 MED ORDER — ALUM & MAG HYDROXIDE-SIMETH 200-200-20 MG/5ML PO SUSP: 30.0000 mL | Freq: Four times a day (QID) | ORAL | Status: DC | PRN
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
## 2017-02-18 NOTE — ED Notes (Signed)
Patient left in the custory of [employer], refused to pt on scrubs and refused to wash the toothpaste off her face, states she is not embarrassed by her nakedness, left in patient gown

## 2017-02-18 NOTE — ED Provider Notes (Signed)
Pt accepted to [facility] in [place], Dr. Dola Factor. Will transfer stable.    Francine Graven, DO 02/18/17 1235

## 2017-02-18 NOTE — ED Notes (Signed)
Per [facility] staff in [place]. Pt has been accepted to [facility] unit, can come anytime. Accepting [employer]. Nurse to Nurse number for report [phone] option 1 for Nurse [place].

## 2017-02-18 NOTE — Progress Notes (Addendum)
Pt. meets criteria for inpatient treatment per Patriciaann Clan, NP.  Referred out to the following hospitals:  [facility]  [facility]  [facility]  [facility]  [facility]         Disposition CSW will continue to follow for placement  Areatha Keas. Judi Cong, MSW, [place] Disposition Clinical Social Work [phone] (cell) [phone] (office)
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
## 2017-02-18 NOTE — ED Notes (Signed)
Refusing to take medications, states she is prophet and much not be touched. Sitting in room with a towel on head, sitter at bedside

## 2017-02-19 ENCOUNTER — Other Ambulatory Visit: Payer: Self-pay

## 2017-02-19 NOTE — Patient Outreach (Signed)
[facility]) Care Management  02/19/2017  [name] [name] [birth year] [id number] \  Telephone Screen Referral Date : 02/19/2017 Referral Source:[facility] ED Census Referral Reason: ED Utilization Insurance: [employer]  Due to the patient's situation in the  ED on 02/18/2017 [facility] will not be able to offer services at this time.  Plan:  RN Health Coach will notify care management assistant of case status.     Lazaro Arms RN, BSN, [place] Direct Dial:  [phone] Fax: [phone]

## 2017-03-01 ENCOUNTER — Other Ambulatory Visit: Payer: Self-pay

## 2017-03-01 ENCOUNTER — Emergency Department (HOSPITAL_COMMUNITY)
Admission: EM | Admit: 2017-03-01 | Discharge: 2017-03-01 | Disposition: A | Discharge status: Home / Self Care | Payer: Medicare HMO | Attending: Emergency Medicine | Admitting: Emergency Medicine

## 2017-03-01 ENCOUNTER — Encounter (HOSPITAL_COMMUNITY): Payer: Self-pay | Admitting: Emergency Medicine

## 2017-03-01 DIAGNOSIS — Z7984 Long term (current) use of oral hypoglycemic drugs: Secondary | ICD-10-CM | POA: Insufficient documentation

## 2017-03-01 DIAGNOSIS — F329 Major depressive disorder, single episode, unspecified: Secondary | ICD-10-CM | POA: Diagnosis present

## 2017-03-01 DIAGNOSIS — E119 Type 2 diabetes mellitus without complications: Secondary | ICD-10-CM | POA: Insufficient documentation

## 2017-03-01 DIAGNOSIS — F203 Undifferentiated schizophrenia: Secondary | ICD-10-CM | POA: Diagnosis not present

## 2017-03-01 DIAGNOSIS — Z79899 Other long term (current) drug therapy: Secondary | ICD-10-CM | POA: Insufficient documentation

## 2017-03-01 LAB — POC URINE PREG, ED: PREG TEST UR: NEGATIVE

## 2017-03-01 MED ORDER — POVIDONE-IODINE 10 % EX SOLN
CUTANEOUS | Status: AC
  Filled 2017-03-01: qty 30

## 2017-03-01 NOTE — ED Notes (Signed)
Pt refused vitals 
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
## 2017-03-01 NOTE — Discharge Instructions (Signed)
You can see the above list of shelters

## 2017-03-01 NOTE — ED Notes (Signed)
Pt escorted out of ED by security and [employer]- alert, ambulatory and cooperative at this time.
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
## 2017-03-02 ENCOUNTER — Emergency Department (HOSPITAL_COMMUNITY)
Admission: EM | Admit: 2017-03-02 | Discharge: 2017-03-02 | Disposition: A | Discharge status: Home / Self Care | Payer: Medicare HMO
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
## 2017-03-06 ENCOUNTER — Ambulatory Visit: Payer: Self-pay | Admitting: Family Medicine

## 2017-03-10 DIAGNOSIS — E119 Type 2 diabetes mellitus without complications: Secondary | ICD-10-CM | POA: Diagnosis not present

## 2017-03-10 DIAGNOSIS — Z590 Homelessness: Secondary | ICD-10-CM | POA: Diagnosis not present

## 2017-03-10 DIAGNOSIS — Z760 Encounter for issue of repeat prescription: Secondary | ICD-10-CM | POA: Diagnosis not present

## 2017-03-10 DIAGNOSIS — R109 Unspecified abdominal pain: Secondary | ICD-10-CM | POA: Diagnosis not present

## 2017-03-11 ENCOUNTER — Encounter: Payer: Self-pay | Admitting: Adult Health

## 2017-03-11 DIAGNOSIS — Z760 Encounter for issue of repeat prescription: Secondary | ICD-10-CM | POA: Diagnosis not present

## 2017-03-18 DIAGNOSIS — Z5181 Encounter for therapeutic drug level monitoring: Secondary | ICD-10-CM | POA: Diagnosis not present

## 2017-03-18 DIAGNOSIS — Z79899 Other long term (current) drug therapy: Secondary | ICD-10-CM | POA: Diagnosis not present

## 2017-03-18 DIAGNOSIS — R5383 Other fatigue: Secondary | ICD-10-CM | POA: Diagnosis not present

## 2017-03-25 DIAGNOSIS — R05 Cough: Secondary | ICD-10-CM | POA: Diagnosis not present

## 2017-03-25 DIAGNOSIS — J029 Acute pharyngitis, unspecified: Secondary | ICD-10-CM | POA: Diagnosis not present

## 2017-03-25 DIAGNOSIS — R51 Headache: Secondary | ICD-10-CM | POA: Diagnosis not present

## 2017-03-25 DIAGNOSIS — M791 Myalgia, unspecified site: Secondary | ICD-10-CM | POA: Diagnosis not present

## 2017-03-25 DIAGNOSIS — R300 Dysuria: Secondary | ICD-10-CM | POA: Diagnosis not present

## 2017-03-25 DIAGNOSIS — R0789 Other chest pain: Secondary | ICD-10-CM | POA: Diagnosis not present

## 2017-03-25 DIAGNOSIS — Z590 Homelessness: Secondary | ICD-10-CM | POA: Diagnosis not present

## 2017-03-25 DIAGNOSIS — R350 Frequency of micturition: Secondary | ICD-10-CM | POA: Diagnosis not present

## 2017-03-25 DIAGNOSIS — G8929 Other chronic pain: Secondary | ICD-10-CM | POA: Diagnosis not present

## 2017-03-26 DIAGNOSIS — Z590 Homelessness: Secondary | ICD-10-CM | POA: Diagnosis not present

## 2017-03-26 DIAGNOSIS — R5383 Other fatigue: Secondary | ICD-10-CM | POA: Diagnosis not present

## 2017-03-26 DIAGNOSIS — E119 Type 2 diabetes mellitus without complications: Secondary | ICD-10-CM | POA: Diagnosis not present

## 2017-03-26 DIAGNOSIS — R05 Cough: Secondary | ICD-10-CM | POA: Diagnosis not present

## 2017-03-29 DIAGNOSIS — R350 Frequency of micturition: Secondary | ICD-10-CM | POA: Diagnosis not present

## 2017-03-29 DIAGNOSIS — R05 Cough: Secondary | ICD-10-CM | POA: Diagnosis not present

## 2017-04-02 DIAGNOSIS — Z79899 Other long term (current) drug therapy: Secondary | ICD-10-CM | POA: Diagnosis not present

## 2017-04-02 DIAGNOSIS — E119 Type 2 diabetes mellitus without complications: Secondary | ICD-10-CM | POA: Diagnosis not present

## 2017-04-02 DIAGNOSIS — R093 Abnormal sputum: Secondary | ICD-10-CM | POA: Diagnosis not present

## 2017-04-02 DIAGNOSIS — J40 Bronchitis, not specified as acute or chronic: Secondary | ICD-10-CM | POA: Diagnosis not present

## 2017-04-02 DIAGNOSIS — F319 Bipolar disorder, unspecified: Secondary | ICD-10-CM | POA: Diagnosis not present

## 2017-04-02 DIAGNOSIS — J029 Acute pharyngitis, unspecified: Secondary | ICD-10-CM | POA: Diagnosis not present

## 2017-04-02 DIAGNOSIS — R350 Frequency of micturition: Secondary | ICD-10-CM | POA: Diagnosis not present

## 2017-04-02 DIAGNOSIS — R05 Cough: Secondary | ICD-10-CM | POA: Diagnosis not present

## 2017-04-02 DIAGNOSIS — Z888 Allergy status to other drugs, medicaments and biological substances status: Secondary | ICD-10-CM | POA: Diagnosis not present

## 2017-04-02 DIAGNOSIS — Z9889 Other specified postprocedural states: Secondary | ICD-10-CM | POA: Diagnosis not present

## 2017-04-07 DIAGNOSIS — F23 Brief psychotic disorder: Secondary | ICD-10-CM | POA: Diagnosis not present

## 2017-04-07 DIAGNOSIS — Z9114 Patient's other noncompliance with medication regimen: Secondary | ICD-10-CM | POA: Diagnosis not present

## 2017-04-07 DIAGNOSIS — F419 Anxiety disorder, unspecified: Secondary | ICD-10-CM | POA: Diagnosis not present

## 2017-04-07 DIAGNOSIS — F29 Unspecified psychosis not due to a substance or known physiological condition: Secondary | ICD-10-CM | POA: Diagnosis not present

## 2017-04-07 DIAGNOSIS — F259 Schizoaffective disorder, unspecified: Secondary | ICD-10-CM | POA: Diagnosis not present

## 2017-04-07 DIAGNOSIS — E162 Hypoglycemia, unspecified: Secondary | ICD-10-CM | POA: Diagnosis not present

## 2017-04-07 DIAGNOSIS — Z9119 Patient's noncompliance with other medical treatment and regimen: Secondary | ICD-10-CM | POA: Diagnosis not present

## 2017-04-07 DIAGNOSIS — R4789 Other speech disturbances: Secondary | ICD-10-CM | POA: Diagnosis not present

## 2017-04-07 DIAGNOSIS — F319 Bipolar disorder, unspecified: Secondary | ICD-10-CM | POA: Diagnosis not present

## 2017-04-07 DIAGNOSIS — Z590 Homelessness: Secondary | ICD-10-CM | POA: Diagnosis not present

## 2017-04-08 DIAGNOSIS — Z590 Homelessness: Secondary | ICD-10-CM | POA: Diagnosis not present

## 2017-04-08 DIAGNOSIS — E162 Hypoglycemia, unspecified: Secondary | ICD-10-CM | POA: Diagnosis not present

## 2017-04-08 DIAGNOSIS — E119 Type 2 diabetes mellitus without complications: Secondary | ICD-10-CM | POA: Diagnosis not present

## 2017-04-08 DIAGNOSIS — R45850 Homicidal ideations: Secondary | ICD-10-CM | POA: Diagnosis not present

## 2017-04-08 DIAGNOSIS — Z87820 Personal history of traumatic brain injury: Secondary | ICD-10-CM | POA: Diagnosis not present

## 2017-04-08 DIAGNOSIS — N3281 Overactive bladder: Secondary | ICD-10-CM | POA: Diagnosis not present

## 2017-04-08 DIAGNOSIS — F319 Bipolar disorder, unspecified: Secondary | ICD-10-CM | POA: Diagnosis not present

## 2017-04-08 DIAGNOSIS — F259 Schizoaffective disorder, unspecified: Secondary | ICD-10-CM | POA: Diagnosis not present

## 2017-04-08 DIAGNOSIS — I252 Old myocardial infarction: Secondary | ICD-10-CM | POA: Diagnosis not present

## 2017-04-08 DIAGNOSIS — F250 Schizoaffective disorder, bipolar type: Secondary | ICD-10-CM | POA: Diagnosis not present

## 2017-04-08 DIAGNOSIS — F23 Brief psychotic disorder: Secondary | ICD-10-CM | POA: Diagnosis not present

## 2017-04-08 DIAGNOSIS — Z9114 Patient's other noncompliance with medication regimen: Secondary | ICD-10-CM | POA: Diagnosis not present

## 2017-04-08 DIAGNOSIS — Z9119 Patient's noncompliance with other medical treatment and regimen: Secondary | ICD-10-CM | POA: Diagnosis not present

## 2017-04-08 DIAGNOSIS — N920 Excessive and frequent menstruation with regular cycle: Secondary | ICD-10-CM | POA: Diagnosis not present

## 2017-05-08 ENCOUNTER — Emergency Department (HOSPITAL_COMMUNITY)
Admission: EM | Admit: 2017-05-08 | Discharge: 2017-05-09 | Disposition: A | Discharge status: Other Acute Inpatient Hospital | Payer: Medicare HMO | Attending: Emergency Medicine | Admitting: Emergency Medicine

## 2017-05-08 DIAGNOSIS — Z79899 Other long term (current) drug therapy: Secondary | ICD-10-CM | POA: Insufficient documentation

## 2017-05-08 DIAGNOSIS — Z7982 Long term (current) use of aspirin: Secondary | ICD-10-CM | POA: Diagnosis not present

## 2017-05-08 DIAGNOSIS — E119 Type 2 diabetes mellitus without complications: Secondary | ICD-10-CM | POA: Diagnosis not present

## 2017-05-08 DIAGNOSIS — F250 Schizoaffective disorder, bipolar type: Secondary | ICD-10-CM | POA: Diagnosis not present

## 2017-05-08 DIAGNOSIS — R456 Violent behavior: Secondary | ICD-10-CM | POA: Diagnosis not present

## 2017-05-08 DIAGNOSIS — R450 Nervousness: Secondary | ICD-10-CM | POA: Diagnosis not present

## 2017-05-08 DIAGNOSIS — F23 Brief psychotic disorder: Secondary | ICD-10-CM

## 2017-05-08 DIAGNOSIS — F419 Anxiety disorder, unspecified: Secondary | ICD-10-CM | POA: Diagnosis not present

## 2017-05-08 DIAGNOSIS — R443 Hallucinations, unspecified: Secondary | ICD-10-CM | POA: Diagnosis present

## 2017-05-08 DIAGNOSIS — Z813 Family history of other psychoactive substance abuse and dependence: Secondary | ICD-10-CM | POA: Diagnosis not present

## 2017-05-08 DIAGNOSIS — Z7984 Long term (current) use of oral hypoglycemic drugs: Secondary | ICD-10-CM | POA: Insufficient documentation

## 2017-05-08 LAB — COMPREHENSIVE METABOLIC PANEL
ALBUMIN: 4.6 g/dL (ref 3.5–5.0)
ALK PHOS: 46 U/L (ref 38–126)
ALT: 18 U/L (ref 14–54)
ANION GAP: 9 (ref 5–15)
AST: 24 U/L (ref 15–41)
BILIRUBIN TOTAL: 0.9 mg/dL (ref 0.3–1.2)
BUN: 19 mg/dL (ref 6–20)
CALCIUM: 9.6 mg/dL (ref 8.9–10.3)
CO2: 24 mmol/L (ref 22–32)
Chloride: 106 mmol/L (ref 101–111)
Creatinine, Ser: 0.85 mg/dL (ref 0.44–1.00)
Glucose, Bld: 118 mg/dL — ABNORMAL HIGH (ref 65–99)
Potassium: 4.1 mmol/L (ref 3.5–5.1)
Sodium: 139 mmol/L (ref 135–145)
TOTAL PROTEIN: 8.9 g/dL — AB (ref 6.5–8.1)

## 2017-05-08 LAB — CBC WITH DIFFERENTIAL/PLATELET
Basophils Absolute: 0 10*3/uL (ref 0.0–0.1)
Basophils Relative: 0 %
EOS PCT: 0 %
Eosinophils Absolute: 0 10*3/uL (ref 0.0–0.7)
HCT: 37.2 % (ref 36.0–46.0)
Hemoglobin: 12.8 g/dL (ref 12.0–15.0)
LYMPHS PCT: 27 %
Lymphs Abs: 1.7 10*3/uL (ref 0.7–4.0)
MCH: 32.1 pg (ref 26.0–34.0)
MCHC: 34.4 g/dL (ref 30.0–36.0)
MCV: 93.2 fL (ref 78.0–100.0)
MONO ABS: 0.5 10*3/uL (ref 0.1–1.0)
Monocytes Relative: 7 %
Neutro Abs: 4.2 10*3/uL (ref 1.7–7.7)
Neutrophils Relative %: 66 %
PLATELETS: 232 10*3/uL (ref 150–400)
RBC: 3.99 MIL/uL (ref 3.87–5.11)
RDW: 13.2 % (ref 11.5–15.5)
WBC: 6.4 10*3/uL (ref 4.0–10.5)

## 2017-05-08 LAB — I-STAT BETA HCG BLOOD, ED (MC, WL, AP ONLY): I-stat hCG, quantitative: <5 m[IU]/mL (ref <5)

## 2017-05-08 LAB — ETHANOL

## 2017-05-08 MED ORDER — LORAZEPAM 2 MG/ML IJ SOLN
2.0000 mg | Freq: Once | INTRAMUSCULAR | Status: AC
  Administered 2017-05-08: 2 mg via INTRAMUSCULAR
  Filled 2017-05-08: qty 1

## 2017-05-08 MED ORDER — ZIPRASIDONE MESYLATE 20 MG IM SOLR
20.0000 mg | Freq: Once | INTRAMUSCULAR | Status: AC
  Administered 2017-05-08: 20 mg via INTRAMUSCULAR
  Filled 2017-05-08: qty 20

## 2017-05-08 MED ORDER — STERILE WATER FOR INJECTION IJ SOLN
INTRAMUSCULAR | Status: AC
  Filled 2017-05-08: qty 10

## 2017-05-08 MED ORDER — DIPHENHYDRAMINE HCL 50 MG/ML IJ SOLN
50.0000 mg | Freq: Once | INTRAMUSCULAR | Status: AC
  Administered 2017-05-08: 50 mg via INTRAMUSCULAR
  Filled 2017-05-08: qty 1

## 2017-05-08 MED ORDER — ZIPRASIDONE HCL 20 MG PO CAPS: 20.0000 mg | ORAL_CAPSULE | Freq: Once | ORAL | Status: DC

## 2017-05-08 NOTE — ED Notes (Signed)
This nurse and Nydia Bouton MHT to pt room to obtain ordered EKG, pt currently sedated. Will inform oncoming shift.
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
## 2017-05-08 NOTE — BH Assessment (Signed)
[place] Assessment Progress Note Case was staffed with Reita Cliche DNP who recommended a inpatient admission to assist with stabilization as appropriate bed placement is investigated.

## 2017-05-08 NOTE — ED Notes (Addendum)
Pt admitted to room #40, pt behavior bizarre, delusional, religiously preoccupied. Pt responding to internal stimuli. During conversation pt disorganized, tangential. Paranoia noted. "They are trying to kill me." Limited assessment d/t pt not answering appropriately to assessment questions at this time.  Encouragement and support provided. Dinner provided. Special checks q 15 mins in place for safety, Video monitoring in place. Will continue to monitor.

## 2017-05-08 NOTE — ED Notes (Signed)
Bed: WA28 Expected date:  Expected time:  Means of arrival:  Comments: [employer]/IVC 
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
## 2017-05-08 NOTE — ED Notes (Signed)
Bed: [facility] Expected date:  Expected time:  Means of arrival:  Comments: 87
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
## 2017-05-08 NOTE — ED Notes (Addendum)
Personal belonging include T-shirt, dress, wig, one shoe, paper license, 2 credit cards, social security benefit card, two necklaces, a ring, a watch, two bracelets, and a charm.

## 2017-05-08 NOTE — ED Notes (Signed)
Pt awake, alert & responsive, no distress noted, calm and resting at present.  Monitoring for safety, Q 15 min checks in effect.

## 2017-05-09 ENCOUNTER — Inpatient Hospital Stay (HOSPITAL_COMMUNITY)
Admission: AD | Admit: 2017-05-09 | Discharge: 2017-05-15 | DRG: 885 | Disposition: A | Discharge status: Home / Self Care | Payer: Medicare HMO | Source: Intra-hospital | Attending: Psychiatry | Admitting: Psychiatry

## 2017-05-09 ENCOUNTER — Encounter (HOSPITAL_COMMUNITY): Payer: Self-pay | Admitting: *Deleted

## 2017-05-09 ENCOUNTER — Other Ambulatory Visit: Payer: Self-pay

## 2017-05-09 DIAGNOSIS — Z888 Allergy status to other drugs, medicaments and biological substances status: Secondary | ICD-10-CM

## 2017-05-09 DIAGNOSIS — J302 Other seasonal allergic rhinitis: Secondary | ICD-10-CM | POA: Diagnosis present

## 2017-05-09 DIAGNOSIS — S61419A Laceration without foreign body of unspecified hand, initial encounter: Secondary | ICD-10-CM | POA: Diagnosis present

## 2017-05-09 DIAGNOSIS — Z813 Family history of other psychoactive substance abuse and dependence: Secondary | ICD-10-CM

## 2017-05-09 DIAGNOSIS — F419 Anxiety disorder, unspecified: Secondary | ICD-10-CM

## 2017-05-09 DIAGNOSIS — Z5329 Procedure and treatment not carried out because of patient's decision for other reasons: Secondary | ICD-10-CM | POA: Diagnosis present

## 2017-05-09 DIAGNOSIS — F250 Schizoaffective disorder, bipolar type: Secondary | ICD-10-CM

## 2017-05-09 DIAGNOSIS — Z7984 Long term (current) use of oral hypoglycemic drugs: Secondary | ICD-10-CM

## 2017-05-09 DIAGNOSIS — G4700 Insomnia, unspecified: Secondary | ICD-10-CM | POA: Diagnosis not present

## 2017-05-09 DIAGNOSIS — Z590 Homelessness: Secondary | ICD-10-CM | POA: Diagnosis not present

## 2017-05-09 DIAGNOSIS — R454 Irritability and anger: Secondary | ICD-10-CM | POA: Diagnosis not present

## 2017-05-09 DIAGNOSIS — Z79899 Other long term (current) drug therapy: Secondary | ICD-10-CM

## 2017-05-09 DIAGNOSIS — F23 Brief psychotic disorder: Secondary | ICD-10-CM | POA: Diagnosis not present

## 2017-05-09 DIAGNOSIS — W25XXXA Contact with sharp glass, initial encounter: Secondary | ICD-10-CM | POA: Diagnosis present

## 2017-05-09 DIAGNOSIS — N3281 Overactive bladder: Secondary | ICD-10-CM | POA: Diagnosis not present

## 2017-05-09 DIAGNOSIS — Z653 Problems related to other legal circumstances: Secondary | ICD-10-CM | POA: Diagnosis not present

## 2017-05-09 DIAGNOSIS — F411 Generalized anxiety disorder: Secondary | ICD-10-CM | POA: Diagnosis present

## 2017-05-09 DIAGNOSIS — R450 Nervousness: Secondary | ICD-10-CM

## 2017-05-09 DIAGNOSIS — R456 Violent behavior: Secondary | ICD-10-CM | POA: Diagnosis not present

## 2017-05-09 DIAGNOSIS — Z635 Disruption of family by separation and divorce: Secondary | ICD-10-CM

## 2017-05-09 DIAGNOSIS — E119 Type 2 diabetes mellitus without complications: Secondary | ICD-10-CM | POA: Diagnosis present

## 2017-05-09 DIAGNOSIS — Z9114 Patient's other noncompliance with medication regimen: Secondary | ICD-10-CM | POA: Diagnosis not present

## 2017-05-09 DIAGNOSIS — Z7982 Long term (current) use of aspirin: Secondary | ICD-10-CM | POA: Diagnosis not present

## 2017-05-09 MED ORDER — ZIPRASIDONE HCL 20 MG PO CAPS
20.0000 mg | ORAL_CAPSULE | Freq: Two times a day (BID) | ORAL | Status: DC
  Administered 2017-05-09: 20 mg via ORAL
  Filled 2017-05-09: qty 1

## 2017-05-09 MED ORDER — OXYBUTYNIN CHLORIDE ER 10 MG PO TB24: 10.0000 mg | ORAL_TABLET | Freq: Every day | ORAL | Status: DC

## 2017-05-09 MED ORDER — OXYBUTYNIN CHLORIDE ER 10 MG PO TB24
10.0000 mg | ORAL_TABLET | Freq: Every day | ORAL | Status: DC
  Administered 2017-05-10: 10 mg via ORAL
  Filled 2017-05-09 (×5): qty 1

## 2017-05-09 MED ORDER — BENZTROPINE MESYLATE 1 MG PO TABS: 1.0000 mg | ORAL_TABLET | Freq: Two times a day (BID) | ORAL | Status: DC

## 2017-05-09 MED ORDER — ACETAMINOPHEN 325 MG PO TABS: 650.0000 mg | ORAL_TABLET | Freq: Four times a day (QID) | ORAL | Status: DC | PRN

## 2017-05-09 MED ORDER — BENZTROPINE MESYLATE 1 MG PO TABS
1.0000 mg | ORAL_TABLET | Freq: Every day | ORAL | Status: DC
  Filled 2017-05-09 (×3): qty 1

## 2017-05-09 MED ORDER — METFORMIN HCL 500 MG PO TABS
500.0000 mg | ORAL_TABLET | Freq: Every day | ORAL | Status: DC
  Filled 2017-05-09 (×3): qty 1

## 2017-05-09 MED ORDER — METFORMIN HCL 500 MG PO TABS: 500.0000 mg | ORAL_TABLET | Freq: Every day | ORAL | Status: DC

## 2017-05-09 MED ORDER — HYDROXYZINE HCL 25 MG PO TABS: 25.0000 mg | ORAL_TABLET | Freq: Three times a day (TID) | ORAL | Status: DC | PRN

## 2017-05-09 MED ORDER — BENZTROPINE MESYLATE 1 MG PO TABS: 1.0000 mg | ORAL_TABLET | Freq: Every day | ORAL | Status: DC

## 2017-05-09 MED ORDER — OLANZAPINE 10 MG PO TBDP: 10.0000 mg | ORAL_TABLET | Freq: Every day | ORAL | Status: DC

## 2017-05-09 MED ORDER — HYDROXYZINE HCL 25 MG PO TABS: 50.0000 mg | ORAL_TABLET | Freq: Three times a day (TID) | ORAL | Status: DC | PRN

## 2017-05-09 MED ORDER — ZIPRASIDONE HCL 20 MG PO CAPS
20.0000 mg | ORAL_CAPSULE | Freq: Two times a day (BID) | ORAL | Status: DC
  Administered 2017-05-10 – 2017-05-14 (×10): 20 mg via ORAL
  Filled 2017-05-09 (×14): qty 1

## 2017-05-09 MED ORDER — ALUM & MAG HYDROXIDE-SIMETH 200-200-20 MG/5ML PO SUSP: 30.0000 mL | ORAL | Status: DC | PRN

## 2017-05-09 MED ORDER — MAGNESIUM HYDROXIDE 400 MG/5ML PO SUSP
30.0000 mL | Freq: Every day | ORAL | Status: DC | PRN
  Administered 2017-05-12: 30 mL via ORAL
  Filled 2017-05-09: qty 30

## 2017-05-09 NOTE — ED Notes (Signed)
Pt is aware that she will transport shortly.

## 2017-05-09 NOTE — ED Notes (Signed)
Patient refused her vitals. She is waiting on the doctor because she wants to go home

## 2017-05-09 NOTE — ED Notes (Addendum)
Pt requesting to talk with social worker, will relay request.  Pt repeatly asks about talking to the social worker and is aware that the CSW is aware of her request.
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
## 2017-05-09 NOTE — ED Notes (Addendum)
Pt sitting quielty reading and agreed to EKG.  Pt reports that she has been calm and does not need medication.  Pt also report that she had follow up testing for her diabetes in [place] and was told she did not need medication for it  Any longer.

## 2017-05-09 NOTE — ED Notes (Signed)
Pt ambulatory w/o difficulty to [facility] with [employer], belongings given to officer.

## 2017-05-09 NOTE — BH Assessment (Signed)
[facility] Assessment Progress Note 05/09/17: Per AC, patient has been accepted to a 500 hall bed later this date. AC to coordinate.

## 2017-05-09 NOTE — ED Notes (Signed)
Pt has been accepted to [facility] per [facility] and can transport

## 2017-05-09 NOTE — ED Notes (Signed)
[employer] contacted for transport 

## 2017-05-09 NOTE — ED Notes (Addendum)
Pt up at the phone, pt initially request her Uncle to get information and said she would sign a release, pt them declined to sign and states she did not want him to get information.  Pt also requesting to be admitted in [place] and not sent out of town, TTS aware.  Pt also states that she does not need medicine and will not take it.

## 2017-05-09 NOTE — ED Notes (Signed)
Up to the bathroom to  Shower and change scrubs

## 2017-05-09 NOTE — ED Notes (Signed)
May give Geodon now Dierdre Forth DNP

## 2017-05-09 NOTE — Tx Team (Signed)
Initial Treatment Plan 05/09/2017 5:20 PM [name] FBX:[id number]    PATIENT STRESSORS: Marital or family conflict Medication change or noncompliance   PATIENT STRENGTHS: Ability for insight Average or above average intelligence General fund of knowledge   PATIENT IDENTIFIED PROBLEMS: Psychosis "Continue the medication that I had earlier"                     DISCHARGE CRITERIA:  Ability to meet basic life and health needs Improved stabilization in mood, thinking, and/or behavior Verbal commitment to aftercare and medication compliance  PRELIMINARY DISCHARGE PLAN: Attend aftercare/continuing care group  PATIENT/FAMILY INVOLVEMENT: This treatment plan has been presented to and reviewed with the patient, [name] [name], and/or family member, .  The patient and family have been given the opportunity to ask questions and make suggestions.  [name], [place], [place] 05/09/2017, 5:20 PM

## 2017-05-09 NOTE — Progress Notes (Signed)
[name] [name] is a 37 year old female pt admitted on involuntary basis. On admission, [name] [name] appears irritable and provides very little as to why [name] [name] is here. [name] [name] reports that [name] [name] does not want to speak about it. [name] [name] denies SI and is able to contract for safety on the unit. [name] [name] denies any substance abuse issues and does not provide an answer in regards to medications prescribed outside of the hospital, but did speak about how [name] [name] did not want the medications changed from the one that [name] [name] took earlier in the afternoon (referencing Geodon). [name] [name] did not provide an answer in regards to living situation other than [name] [name] needs a bus ticket to get to [place] when [name] [name] gets discharged. [name] [name] was oriented to the unit and safety maintained at this time.

## 2017-05-09 NOTE — ED Notes (Signed)
On the phone 

## 2017-05-09 NOTE — ED Notes (Signed)
Pt ate sandwich and juice x2 prior

## 2017-05-09 NOTE — ED Notes (Addendum)
Dr Tiburcio Pea and Theodoro Clock DNP into see.  Pt states that she "don't need any help... Want to go back to [place]..."  Pt denies si/hi/avh at this time.

## 2017-05-10 DIAGNOSIS — F419 Anxiety disorder, unspecified: Secondary | ICD-10-CM

## 2017-05-10 DIAGNOSIS — F250 Schizoaffective disorder, bipolar type: Principal | ICD-10-CM

## 2017-05-10 DIAGNOSIS — Z813 Family history of other psychoactive substance abuse and dependence: Secondary | ICD-10-CM

## 2017-05-10 DIAGNOSIS — R450 Nervousness: Secondary | ICD-10-CM

## 2017-05-10 LAB — GLUCOSE, CAPILLARY: Glucose-Capillary: 109 mg/dL — ABNORMAL HIGH (ref 65–99)

## 2017-05-10 MED ORDER — OLANZAPINE 5 MG PO TBDP: 5.0000 mg | ORAL_TABLET | Freq: Three times a day (TID) | ORAL | Status: DC | PRN

## 2017-05-10 MED ORDER — OXYBUTYNIN CHLORIDE ER 5 MG PO TB24
ORAL_TABLET | ORAL | Status: AC
  Filled 2017-05-10: qty 2

## 2017-05-10 MED ORDER — LORAZEPAM 1 MG PO TABS: 1.0000 mg | ORAL_TABLET | ORAL | Status: DC | PRN
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
## 2017-05-10 NOTE — BHH Suicide Risk Assessment (Signed)
[place] INPATIENT:  Family/Significant Other Suicide Prevention Education  Suicide Prevention Education:  Patient Refusal for Family/Significant Other Suicide Prevention Education: The patient [name] [name] has refused to provide written consent for family/significant other to be provided Family/Significant Other Suicide Prevention Education during admission and/or prior to discharge.  Physician notified.  Darin Engels 05/10/2017, 2:10 PM
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
## 2017-05-10 NOTE — BHH Group Notes (Signed)
[place] Group Notes:  (Nursing/MHT/Case Management/Adjunct)  Date:  05/10/2017  Time:  10:24 AM  Type of Therapy:  Psychoeducational Skills  Participation Level:  Minimal  Participation Quality:  Resistant  Affect:  Resistant  Cognitive:  Disorganized  Insight:  Lacking  Engagement in Group:  Lacking  Modes of Intervention:  Problem-solving  Summary of Progress/Problems: Pt attended Psychoeducational group with topic healthy support system.   Benancio Deeds Shanta 05/10/2017, 10:24 AM

## 2017-05-10 NOTE — BHH Group Notes (Signed)
[place] Group Notes:  (Nursing/MHT/Case Management/Adjunct)  Date:  05/10/2017  Time:  10:23 AM  Type of Therapy:  Patient self inventory group and goals group  Participation Level:  Active  Participation Quality:  Inattentive  Affect:  Anxious and Resistant  Cognitive:  Disorganized  Insight:  Lacking  Engagement in Group:  Lacking  Modes of Intervention:  Education  Summary of Progress/Problems: Pt did attend patient self inventory group and goals group. [name] [name] 05/10/2017, 10:23 AM
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
## 2017-05-10 NOTE — Progress Notes (Signed)
Patient ID: [name] [name], female   DOB: [birth year], 37 y.o.   MRN: [id number]  Patient has refused to staff to check vitals/CBG. Patient refused morning medications. Patient states, "I am not diabetic, I don't need metformin. I don't take Geodon do not give it to me". Patient educated about medications and benefits. Patient refused education and states, "I will wait until I speak with the provider".

## 2017-05-10 NOTE — Progress Notes (Addendum)
Patient ID: [name] [name], female   DOB: 10-06-1980, 37 y.o.   MRN: [id number]  Nursing Progress Note [id number]  Data: Patient remains labile. Patient did not attend lunch but was provided a tray. Patient observed resting in her room. Patient denies SI/HI/AVH. Patient contracts for safety on the unit at this time. Patient complains of chronic, generalized pain but refuses medication. Patient completed self-inventory sheet and rates depression, hopelessness, and anxiety 3,4,2 respectively. Patient rates their sleep and appetite as good/good respectively. Patient states goal for today is to "communicate effectively" and "work on my comfort level". Patient refused EKG.  Action: Patient educated about and provided medication per provider's orders. Patient safety maintained with q15 min safety checks. Low fall risk precautions in place. Emotional support given. 1:1 interaction and active listening provided. Patient encouraged to attend meals and groups. Labs, vital signs and patient behavior monitored throughout shift. Patient encouraged to work on treatment plan and goals.  Response: Patient remains safe on the unit at this time. Will continue to support and monitor.

## 2017-05-10 NOTE — B[facility] Group Notes (Addendum)
[place] LCSW Group Therapy Note  Date/Time:  05/10/2017 11AM-12PM  Type of Therapy and Topic:  Group Therapy:  Healthy and Unhealthy Supports  Participation Level:  Did Not Attend   Description of Group:  Patients in this group were introduced to the idea of adding a variety of healthy supports to address the various needs in their lives.Patients discussed what additional healthy supports could be helpful in their recovery and wellness after discharge in order to prevent future hospitalizations.   An emphasis was placed on using counselor, doctor, therapy groups, 12-step groups, and problem-specific support groups to expand supports.  They also worked as a group on developing a specific plan for several patients to deal with unhealthy supports through [place], psychoeducation with loved ones, and even termination of relationships.   Therapeutic Goals:   1)  discuss importance of adding supports to stay well once out of the hospital  2)  compare healthy versus unhealthy supports and identify some examples of each  3)  generate ideas and descriptions of healthy supports that can be added  4)  offer mutual support about how to address unhealthy supports  5)  encourage active participation in and adherence to discharge plan    Summary of Patient Progress:  Patient was encouraged and invited to attend group. Patient did not attend group. Social worker will continue to encourage group participation in the future.     Therapeutic Modalities:   Motivational Interviewing Brief Solution-Focused Therapy  Darin Engels, LCSWA
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
## 2017-05-10 NOTE — Progress Notes (Signed)
Adult Psychoeducational Group Note  Date:  05/10/2017 Time:  12:05 AM  Group Topic/Focus:  Wrap-Up Group:   The focus of this group is to help patients review their daily goal of treatment and discuss progress on daily workbooks.  Participation Level:  Did Not Attend  Participation Quality:  Did Not Attend  Affect:  Did Not Attend  Cognitive:  Did Not Attend  Insight: None  Engagement in Group:  Did Not Attend  Modes of Intervention:  Did Not Attend  Additional Comments:  Pt did not attend evening wrap up group.  Candy Sledge 05/10/2017, 12:05 AM

## 2017-05-10 NOTE — Progress Notes (Signed)
Patient ID: [name] [name], female   DOB: 06-05-1980, 37 y.o.   MRN: [id number] D: Pt at the time of assessment denies depression, anxiety, pain, SI, HI and AVH; she states, "I just need to take a break." Pt is very flat, minimal interaction and withdrawn to room. Pt remained nonviolent through the shift. A: Medications offered as prescribed.  Support, encouragement, and safe environment provided.  15-minute safety checks continue. R: Pt was med compliant.  Pt did not attend group. Safety checks continue.

## 2017-05-11 DIAGNOSIS — R454 Irritability and anger: Secondary | ICD-10-CM

## 2017-05-11 DIAGNOSIS — R456 Violent behavior: Secondary | ICD-10-CM

## 2017-05-11 DIAGNOSIS — G4700 Insomnia, unspecified: Secondary | ICD-10-CM

## 2017-05-11 DIAGNOSIS — E119 Type 2 diabetes mellitus without complications: Secondary | ICD-10-CM

## 2017-05-11 LAB — PREGNANCY, URINE: PREG TEST UR: NEGATIVE

## 2017-05-11 LAB — RAPID URINE DRUG SCREEN, HOSP PERFORMED
Amphetamines: NOT DETECTED
BARBITURATES: NOT DETECTED
Benzodiazepines: NOT DETECTED
COCAINE: NOT DETECTED
OPIATES: NOT DETECTED
Tetrahydrocannabinol: NOT DETECTED

## 2017-05-11 MED ORDER — HYDROXYZINE HCL 50 MG PO TABS
50.0000 mg | ORAL_TABLET | Freq: Four times a day (QID) | ORAL | Status: DC | PRN
  Filled 2017-05-11: qty 10

## 2017-05-11 NOTE — Progress Notes (Signed)
Adult Psychoeducational Group Note  Date:  05/11/2017 Time:  9:18 PM  Group Topic/Focus:  Wrap-Up Group:   The focus of this group is to help patients review their daily goal of treatment and discuss progress on daily workbooks.  Participation Level:  Active  Participation Quality:  Appropriate  Affect:  Appropriate  Cognitive:  Appropriate  Insight: Appropriate  Engagement in Group:  Engaged  Modes of Intervention:  Discussion  Additional Comments:  The patient expressed that she rates today a 8.The patient also said that she attended group and reach her goal.  Nash Shearer 05/11/2017, 9:18 PM
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
## 2017-05-11 NOTE — Progress Notes (Signed)
Recreation Therapy Notes  INPATIENT RECREATION THERAPY ASSESSMENT  Patient Details Name: [name] [name] MRN: [id number] DOB: [birth year] Today's Date: 05/11/2017       Information Obtained From: Patient  Able to Participate in Assessment/Interview: Yes  Patient Presentation: Alert, Oriented  Reason for Admission (Per Patient): Other (Comments)(Pt stated she was committed and doesn't know why)  Patient Stressors: Relationship, Other (Comment)(Homeless)  Coping Skills:   Journal, Sports, TV, Arguments, Music, Exercise, Meditate, Deep Breathing, Impulsivity, Talk, Prayer, Avoidance, Read, Hot Bath/Shower  Leisure Interests (2+):  Games - Cards, Individual - Other (Comment)(Massage Therapy)  Frequency of Recreation/Participation: (Pt stated she gives herself massages daily; doesn't usually play cards)  Awareness of Community Resources:  Yes  Community Resources:  Engineer, drilling, [employer], Art therapist  Current Use: Yes  If no, Barriers?:    Expressed Interest in [employer] Information: No  [employer] of Residence:  [place]  Patient Main Form of Transportation: Walk  Patient Strengths:  Kind; Loving  Patient Identified Areas of Improvement:  Kind; Loving  Patient Goal for Hospitalization:  "To be listened to credibly, I am credible, I don't want the wrong medications, I am not suicidal or homicidal.  I want to be put on weaker medications.  I do not like sports medicine.  I don't want to be on any medicines and I want to go to a program for women."  Current SI (including self-harm):  No  Current HI:  No  Current AVH: No  Staff Intervention Plan: Group Attendance, Collaborate with Interdisciplinary Treatment Team  Consent to Intern Participation: N/A      Victorino Sparrow, LRT/CTRS  Ria Comment, [place] 05/11/2017, 1:51 PM

## 2017-05-11 NOTE — Progress Notes (Signed)
Recreation Therapy Notes  Date: 5.6.19 Time: 1000 Location: 500 Hall Dayroom  Group Topic: Anger Management  Goal Area(s) Addresses:  Patient will identify triggers for anger.  Patient will identify physical reaction to anger.   Patient will identify benefit of using coping skills when angry.  Intervention: Worksheet  Activity: Intro to Anger Management.  Patients were given a worksheet in which they were to identify what causes their anger, their reaction to anger and any problems they have encountered because of anger.  Education: Anger Management, Discharge Planning   Education Outcome: Acknowledges education/In group clarification offered/Needs additional education.   Clinical Observations/Feedback: Pt did not attend group.      Victorino Sparrow, LRT/CTRS          Victorino Sparrow A 05/11/2017 11:42 AM

## 2017-05-11 NOTE — Tx Team (Signed)
Interdisciplinary Treatment and Diagnostic Plan Update  05/11/2017 Time of Session: 4:17 PM  [name] [name] MRN: [id number]  Principal Diagnosis: Schizoaffective disorder, bipolar type [facility])  Secondary Diagnoses: Principal Problem:   Schizoaffective disorder, bipolar type ([place])   Current Medications:  Current Facility-Administered Medications  Medication Dose Route Frequency Provider Last Rate Last Dose  . acetaminophen (TYLENOL) tablet 650 mg  650 mg Oral Q6H PRN Patrecia Pour, NP      . alum & mag hydroxide-simeth (MAALOX/MYLANTA) 200-200-20 MG/5ML suspension 30 mL  30 mL Oral Q4H PRN Patrecia Pour, NP      . hydrOXYzine (ATARAX/VISTARIL) tablet 50 mg  50 mg Oral Q6H PRN Pennelope Bracken, MD      . OLANZapine zydis (ZYPREXA) disintegrating tablet 5 mg  5 mg Oral Q8H PRN Derrill Center, NP       And  . LORazepam (ATIVAN) tablet 1 mg  1 mg Oral PRN Derrill Center, NP      . magnesium hydroxide (MILK OF MAGNESIA) suspension 30 mL  30 mL Oral Daily PRN Patrecia Pour, NP      . oxybutynin (DITROPAN-XL) 24 hr tablet 10 mg  10 mg Oral QHS Patrecia Pour, NP   10 mg at 05/10/17 0150  . ziprasidone (GEODON) capsule 20 mg  20 mg Oral BID WC Patrecia Pour, NP   20 mg at 05/11/17 4268    PTA Medications: Medications Prior to Admission  Medication Sig Dispense Refill Last Dose  . ASPIRIN PO Take by mouth.   Past Month at Unknown time  . benztropine (COGENTIN) 1 MG tablet Take 1 tablet (1 mg total) by mouth at bedtime. For prevention of drug induced tremors (Patient not taking: Reported on 05/08/2017) 30 tablet 0 Not Taking at Unknown time  . fluticasone (FLONASE) 50 MCG/ACT nasal spray Place 2 sprays into both nostrils daily. (Patient not taking: Reported on 05/08/2017) 16 g 2 Not Taking at Unknown time  . hydrOXYzine (ATARAX/VISTARIL) 50 MG tablet Take 1 tablet (50 mg total) by mouth 3 (three) times daily as needed for anxiety. (Patient not taking: Reported on 05/08/2017) 60  tablet 0 Not Taking at Unknown time  . metFORMIN (GLUCOPHAGE) 500 MG tablet Take 1 tablet (500 mg total) by mouth daily with breakfast. For diabetes management (Patient not taking: Reported on 05/08/2017) 10 tablet 0 Not Taking at Unknown time  . montelukast (SINGULAIR) 10 MG tablet Take 1 tablet (10 mg total) by mouth at bedtime. 30 tablet 0   . oxybutynin (DITROPAN-XL) 10 MG 24 hr tablet Take 1 tablet (10 mg total) by mouth at bedtime. For over active bladder (Patient not taking: Reported on 05/08/2017)   Not Taking at Unknown time  . promethazine-dextromethorphan (PROMETHAZINE-DM) 6.25-15 MG/5ML syrup Take 5 mLs by mouth 3 (three) times daily as needed for cough. (Patient not taking: Reported on 05/08/2017) 118 mL 0 Not Taking at Unknown time  . temazepam (RESTORIL) 7.5 MG capsule Take 1 capsule (7.5 mg total) by mouth at bedtime. For sleep (Patient not taking: Reported on 05/08/2017) 7 capsule 0 Not Taking at Unknown time    Patient Stressors: Marital or family conflict Medication change or noncompliance  Patient Strengths: Ability for insight Average or above average intelligence General fund of knowledge  Treatment Modalities: Medication Management, Group therapy, Case management,  1 to 1 session with clinician, Psychoeducation, Recreational therapy.   Physician Treatment Plan for Primary Diagnosis: Schizoaffective disorder, bipolar type ([place]) Long Term Goal(s): Improvement  in symptoms so as ready for discharge  Short Term Goals: Ability to identify changes in lifestyle to reduce recurrence of condition will improve Ability to demonstrate self-control will improve Compliance with prescribed medications will improve Ability to verbalize feelings will improve Ability to demonstrate self-control will improve Ability to identify and develop effective coping behaviors will improve  Medication Management: Evaluate patient's response, side effects, and tolerance of medication  regimen.  Therapeutic Interventions: 1 to 1 sessions, Unit Group sessions and Medication administration.  Evaluation of Outcomes: Progressing  Physician Treatment Plan for Secondary Diagnosis: Principal Problem:   Schizoaffective disorder, bipolar type ([place])   Long Term Goal(s): Improvement in symptoms so as ready for discharge  Short Term Goals: Ability to identify changes in lifestyle to reduce recurrence of condition will improve Ability to demonstrate self-control will improve Compliance with prescribed medications will improve Ability to verbalize feelings will improve Ability to demonstrate self-control will improve Ability to identify and develop effective coping behaviors will improve  Medication Management: Evaluate patient's response, side effects, and tolerance of medication regimen.  Therapeutic Interventions: 1 to 1 sessions, Unit Group sessions and Medication administration.  Evaluation of Outcomes: Progressing   RN Treatment Plan for Primary Diagnosis: Schizoaffective disorder, bipolar type ([place]) Long Term Goal(s): Knowledge of disease and therapeutic regimen to maintain health will improve  Short Term Goals: Ability to identify and develop effective coping behaviors will improve and Compliance with prescribed medications will improve  Medication Management: RN will administer medications as ordered by provider, will assess and evaluate patient's response and provide education to patient for prescribed medication. RN will report any adverse and/or side effects to prescribing provider.  Therapeutic Interventions: 1 on 1 counseling sessions, Psychoeducation, Medication administration, Evaluate responses to treatment, Monitor vital signs and CBGs as ordered, Perform/monitor CIWA, COWS, AIMS and Fall Risk screenings as ordered, Perform wound care treatments as ordered.  Evaluation of Outcomes: Progressing   LCSW Treatment Plan for Primary Diagnosis: Schizoaffective  disorder, bipolar type ([place]) Long Term Goal(s): Safe transition to appropriate next level of care at discharge, Engage patient in therapeutic group addressing interpersonal concerns.  Short Term Goals: Engage patient in aftercare planning with referrals and resources  Therapeutic Interventions: Assess for all discharge needs, 1 to 1 time with Social worker, Explore available resources and support systems, Assess for adequacy in community support network, Educate family and significant other(s) on suicide prevention, Complete Psychosocial Assessment, Interpersonal group therapy.  Evaluation of Outcomes: Progressing  States she wants to get to [place] to stay with family, and admits she has not yet been saying any outpatient providers   Progress in Treatment: Attending groups: Yes Participating in groups: Yes Taking medication as prescribed: Yes Toleration medication: Yes, no side effects reported at this time Family/Significant other contact made:  Patient understands diagnosis: Yes AEB Discussing patient identified problems/goals with staff: Yes Medical problems stabilized or resolved: Yes Denies suicidal/homicidal ideation: Yes Issues/concerns per patient self-inventory: None Other: N/A  New problem(s) identified: None identified at this time.   New Short Term/Long Term Goal(s): "I want to go home.  My plan is to go stay with my Uncle in [place]. I like the medication the way it is.  Don't change anything."  Discharge Plan or Barriers:   Reason for Continuation of Hospitalization:  Hallucinations  Medication stabilization   Estimated Length of Stay: 5/10  Attendees: Patient: [name] [name] 05/11/2017  4:17 PM  Physician: Maris Berger, MD 05/11/2017  4:17 PM  Nursing: Sena Hitch,  RN 05/11/2017  4:17 PM  RN Care Manager: Lars Pinks, RN 05/11/2017  4:17 PM  Social Worker: Ripley Fraise 05/11/2017  4:17 PM  Recreational Therapist: Winfield Cunas  05/11/2017  4:17 PM  Other: Norberto Sorenson 05/11/2017  4:17 PM  Other:  05/11/2017  4:17 PM    Scribe for Treatment Team:  Roque Lias LCSW 05/11/2017 4:17 PM

## 2017-05-11 NOTE — Plan of Care (Signed)
  Problem: Coping: Goal: Will verbalize feelings Outcome: Progressing   Problem: Health Behavior/Discharge Planning: Goal: Compliance with prescribed medication regimen will improve Outcome: Progressing   Problem: Safety: Goal: Ability to remain free from injury will improve Outcome: Progressing  DAR NOTE: Patient presents with anxious affect and mood.  Denies pain, auditory and visual hallucinations.  Rates depression at 0, hopelessness at 0, and anxiety at 0.  Maintained on routine safety checks.  Medications given as prescribed.  Support and encouragement offered as needed.  Attended group and participated.  States goal for today is "discharge plan."  Patient visible in milieu without interacting.  Continues to be preoccupied with discharge plan and getting the right medications.  Offered no complaint.
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
## 2017-05-12 NOTE — Progress Notes (Signed)
Recreation Therapy Notes  Date: 5.7.19 Time: 0945 Location: 500 Hall Dayroom  Group Topic: Wellness  Goal Area(s) Addresses:  Patient will define components of whole wellness. Patient will verbalize benefit of whole wellness.  Intervention: Music  Activity: Exercise.  LRT led group in four rounds of exercises.  LRT let each patient pick an exercise to lead the group in.  Education:Wellness, Discharge Planning.   Education Outcome: Acknowledges education/In group clarification offered/Needs additional education.   Clinical Observations/Feedback:  Pt did not attend group.   Victorino Sparrow, LRT/CTRS         Ria Comment, Deby Adger A 05/12/2017 11:11 AM

## 2017-05-12 NOTE — [facility] Group Notes (Signed)
LCSW Group Therapy Note  05/12/2017 1:15pm  Type of Therapy/Topic:  Group Therapy:  Feelings about Diagnosis  Participation Level:  Active   Description of Group:   This group will allow patients to explore their thoughts and feelings about diagnoses they have received. Patients will be guided to explore their level of understanding and acceptance of these diagnoses. Facilitator will encourage patients to process their thoughts and feelings about the reactions of others to their diagnosis and will guide patients in identifying ways to discuss their diagnosis with significant others in their lives. This group will be process-oriented, with patients participating in exploration of their own experiences, giving and receiving support, and processing challenge from other group members.   Therapeutic Goals: 1. Patient will demonstrate understanding of diagnosis as evidenced by identifying two or more symptoms of the disorder 2. Patient will be able to express two feelings regarding the diagnosis 3. Patient will demonstrate their ability to communicate their needs through discussion and/or role play  Summary of Patient Progress:  Stayed the entire time, engaged throughout.  Spoke at length about community and how one must make themselves a priorityand not get swallowed up by the community.  Led to an interesting discussion about what makes someone trustworthy, and what one must do to satisfy their curiosity about that.     Therapeutic Modalities:   Cognitive Behavioral Therapy Brief Therapy Feelings Identification    [name], LCSW 05/12/2017 2:48 PM
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
## 2017-05-12 NOTE — Plan of Care (Signed)
  Problem: Health Behavior/Discharge Planning: Goal: Compliance with treatment plan for underlying cause of condition will improve Outcome: Not Progressing   Problem: Safety: Goal: Periods of time without injury will increase Outcome: Progressing   Problem: Education: Goal: Will be free of psychotic symptoms Outcome: Not Progressing   Problem: Coping: Goal: Coping ability will improve Outcome: Not Progressing   Problem: Health Behavior/Discharge Planning: Goal: Compliance with prescribed medication regimen will improve Outcome: Not Progressing  DAR NOTE: Patient presents with irritable affect and mood lability.  Patient verbally aggressive and abusive towards staff.  Continues to demand and request for multiple items from staff.  Denies suicidal thoughts, auditory and visual hallucinations.  Rates depression at 0, hopelessness at 0, and anxiety at 0.  Maintained on routine safety checks.  Medications given as prescribed.  Support and encouragement offered as needed.  Attended group and participated.  States goal for today is "bill and discharge."  Patient visible in milieu for therapy.   Patient remained safe on the unit.

## 2017-05-12 NOTE — Progress Notes (Addendum)
Nursing Progress Note: 7p-7a D: Pt currently presents with a labile/argumentative/irritable/disorganized affect and behavior. Pt states "Tell the doctor I am being compliant. I refuse to get my blood drawn. My blood is a part of me, and I ain't given that up for no crook. I take Geodon so I am being compliant." Interacting intrusively/inappropriate content (was redirected) with the milieu. Pt reports good sleep during the previous night with current medication regimen. Pt did attend wrap-up group.  A: Pt's labs and vitals were monitored throughout the night. Pt supported emotionally and encouraged to express concerns and questions. Pt educated on medications.  R: Pt's safety ensured with 15 minute and environmental checks. Pt currently denies SI, HI, and AVH. Pt verbally contracts to seek staff if SI,HI, or AVH occurs and to consult with staff before acting on any harmful thoughts. Will continue to monitor.

## 2017-05-13 LAB — GLUCOSE, CAPILLARY: GLUCOSE-CAPILLARY: 62 mg/dL — AB (ref 65–99)

## 2017-05-13 NOTE — BHH Group Notes (Signed)
LCSW Group Therapy Note  05/13/2017 1:15pm  Type of Therapy/Topic:  Group Therapy:  Balance in Life  Participation Level:  Active  Description of Group:    This group will address the concept of balance and how it feels and looks when one is unbalanced. Patients will be encouraged to process areas in their lives that are out of balance and identify reasons for remaining unbalanced. Facilitators will guide patients in utilizing problem-solving interventions to address and correct the stressor making their life unbalanced. Understanding and applying boundaries will be explored and addressed for obtaining and maintaining a balanced life. Patients will be encouraged to explore ways to assertively make their unbalanced needs known to significant others in their lives, using other group members and facilitator for support and feedback.  Therapeutic Goals: 1. Patient will identify two or more emotions or situations they have that consume much of in their lives. 2. Patient will identify signs/triggers that life has become out of balance:  3. Patient will identify two ways to set boundaries in order to achieve balance in their lives:  4. Patient will demonstrate ability to communicate their needs through discussion and/or role plays  Summary of Patient Progress:      Therapeutic Modalities:   Cognitive [place] Training  [name], [place] 05/13/2017 3:54 PM

## 2017-05-13 NOTE — Progress Notes (Signed)
Recreation Therapy Notes  Date: 5.8.19 Time: 1000 Location: 500 Hall Dayroom  Group Topic: Self-Esteem  Goal Area(s) Addresses:  Patient will successfully identify positive attributes about themselves.  Patient will successfully identify benefit of improved self-esteem.   Behavioral Response: Engaged  Intervention: Markers, blank crest  Activity: [place].  Patients were to identify things that mean something to them.  Patients could highlight their biggest accomplishments, proudest moment, best feature, something they are good at or something they value.  Education:  Self-Esteem, Dentist.   Education Outcome: Acknowledges education/In group clarification offered/Needs additional education  Clinical Observations/Feedback: Pt focused on all of her accomplishments.  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  Pt was bright and on task throughout group.    Victorino Sparrow, LRT/CTRS      Ria Comment, Lakashia Collison A 05/13/2017 11:08 AM

## 2017-05-13 NOTE — Progress Notes (Signed)
Psychoeducational Group Note  Date:  05/13/2017 Time:  2058  Group Topic/Focus:  Wrap-Up Group:   The focus of this group is to help patients review their daily goal of treatment and discuss progress on daily workbooks.  Participation Level: Did Not Attend  Participation Quality:  Not Applicable  Affect:  Not Applicable  Cognitive:  Not Applicable  Insight:  Not Applicable  Engagement in Group: Not Applicable  Additional Comments: The patient did not attend group this evening.   Archie Balboa S 05/13/2017, 8:57 PM

## 2017-05-13 NOTE — Plan of Care (Signed)
  Problem: Education: Goal: Knowledge of [facility] Education information/materials will improve Outcome: Progressing   Problem: Activity: Goal: Interest or engagement in activities will improve Outcome: Progressing   Problem: Safety: Goal: Periods of time without injury will increase Outcome: Progressing   Problem: Nutritional: Goal: Ability to achieve adequate nutritional intake will improve Outcome: Progressing D: Pt visible in milieu / hall on initial contact. A & O X3. Denies SI, HI, AVH and pain when assessed. Presents animated, hyper verbal and religiously preoccupied on interactions. Reports she's sleeping well with good appetite. Rates her depression, anxiety and hopelessness all 0/10. Pt's goal is to "be discharged, I want to go home or to my uncle's house up [place]". A: Introduced self to pt. Emotional support and availability provided to pt. Scheduled medications given as ordered with verbal education and effects monitored. Compliance with treatment plan / current regimen encouraged including groups. Safety checks maintained at Q 15 minutes intervals without outburst or self harm gestures.   R: Pt receptive to care. Compliant with medications when offered. Denies adverse drug reactions when assessed this shift. Attended groups when prompted. Tolerates all PO intake well. Remains safe on and off unit.

## 2017-05-13 NOTE — Progress Notes (Signed)
[facility] MD Progress Note  05/13/2017 4:21 PM [name] [name]  MRN:  [id number] Subjective:    [name] [name] is a 37 y/o F with history of schizoaffective disorder, bipolar type who was admitted on IVC after brought in to ED by police with agitation, disorganization, aggression, and having broke a window by punching it (causing laceration to her hand). Pt was irritable and uncooperative with initial interview, and she was only in agreement to be started on geodon at low dose of 20mg  po BID. Pt had been refusing multiple medications including metformin and oxybutynin (now both discontinued). She refused to have her dose of geodon adjusted. She has episodic agitation while on the unit, but overall she has been having incremental improvement of her presenting symptoms.  Today upon interview, pt shares, "I'm doing good. I'm sleeping well. My heart feels better." Pt is asked to be more specific about her heart, and she explains that emotionally she is feeling better in terms of her mood and anxiety. She denies any physical complaints today. She denies SI/HI/AH/VH. She is in agreement to continue her current treatment regimen without changes. She is unsure if she would like to discharge to be with her uncle in [place] of she would like to stay in the [place] area to take care of her pending legal charges and obtain an ACT team. Pt asks for time to consider her options, and later in the day she indicates that she would like to go to [place] as she feels she would have more support there. Discussed with patient that SW team would need time to set this up, and pt was in agreement. She had no further questions, comments, or concerns.   Principal Problem: Schizoaffective disorder, bipolar type ([place]) Diagnosis:   Patient Active Problem List   Diagnosis Date Noted  . Insomnia [G47.00]   . Anxiety state [F41.1]   . Overactive bladder [N32.81]   . Diabetes mellitus ([place]) [E11.9] 02/08/2015  .  Schizoaffective disorder, bipolar type ([place]) [F25.0] 01/28/2015  . Non compliance w medication regimen [Z91.14]    Total Time spent with patient: 30 minutes  Past Psychiatric History: see H&P  Past Medical History:  Past Medical History:  Diagnosis Date  . Bipolar affective disorder, currently manic, mild ([place])   . Diabetes mellitus without complication ([place])   . Schizophrenia [facility])     Past Surgical History:  Procedure Laterality Date  . WISDOM TOOTH EXTRACTION     Family History:  Family History  Problem Relation Age of Onset  . Drug abuse Maternal Uncle    Family Psychiatric  History: see H&P Social History:  Social History   Substance and Sexual Activity  Alcohol Use No     Social History   Substance and Sexual Activity  Drug Use No    Social History   Socioeconomic History  . Marital status: Legally Separated    Spouse name: Not on file  . Number of children: Not on file  . Years of education: Not on file  . Highest education level: Not on file  Occupational History  . Not on file  Social Needs  . Financial resource strain: Not on file  . Food insecurity:    Worry: Not on file    Inability: Not on file  . Transportation needs:    Medical: Not on file    Non-medical: Not on file  Tobacco Use  . Smoking status: Never Smoker  . Smokeless tobacco: Never Used  Substance and  Sexual Activity  . Alcohol use: No  . Drug use: No  . Sexual activity: Not Currently  Lifestyle  . Physical activity:    Days per week: Not on file    Minutes per session: Not on file  . Stress: Not on file  Relationships  . Social connections:    Talks on phone: Not on file    Gets together: Not on file    Attends religious service: Not on file    Active member of club or organization: Not on file    Attends meetings of clubs or organizations: Not on file    Relationship status: Not on file  Other Topics Concern  . Not on file  Social History Narrative  . Not on file    Additional Social History:                         Sleep: Fair  Appetite:  Good  Current Medications: Current Facility-Administered Medications  Medication Dose Route Frequency Provider Last Rate Last Dose  . acetaminophen (TYLENOL) tablet 650 mg  650 mg Oral Q6H PRN Patrecia Pour, NP      . alum & mag hydroxide-simeth (MAALOX/MYLANTA) 200-200-20 MG/5ML suspension 30 mL  30 mL Oral Q4H PRN Patrecia Pour, NP      . hydrOXYzine (ATARAX/VISTARIL) tablet 50 mg  50 mg Oral Q6H PRN Pennelope Bracken, MD      . OLANZapine zydis (ZYPREXA) disintegrating tablet 5 mg  5 mg Oral Q8H PRN Derrill Center, NP       And  . LORazepam (ATIVAN) tablet 1 mg  1 mg Oral PRN Derrill Center, NP      . magnesium hydroxide (MILK OF MAGNESIA) suspension 30 mL  30 mL Oral Daily PRN Patrecia Pour, NP   30 mL at 05/12/17 0908  . ziprasidone (GEODON) capsule 20 mg  20 mg Oral BID WC Patrecia Pour, NP   20 mg at 05/13/17 1057    Lab Results: No results found for this or any previous visit (from the past 48 hour(s)).  Blood Alcohol level:  Lab Results  Component Value Date   ETH <10 05/08/2017   ETH <10 90/24/0973    Metabolic Disorder Labs: Lab Results  Component Value Date   HGBA1C 5.6 07/25/2015   MPG 114 07/25/2015   MPG 137 02/01/2015   Lab Results  Component Value Date   PROLACTIN 46.5 (H) 07/25/2015   PROLACTIN 21.6 02/01/2015   Lab Results  Component Value Date   CHOL 135 07/25/2015   TRIG 59 07/25/2015   HDL 50 07/25/2015   CHOLHDL 2.7 07/25/2015   VLDL 12 07/25/2015   LDLCALC 73 07/25/2015   LDLCALC 78 02/01/2015    Physical Findings: AIMS: Facial and Oral Movements Muscles of Facial Expression: None, normal Lips and Perioral Area: None, normal Jaw: None, normal Tongue: None, normal,Extremity Movements Upper (arms, wrists, hands, fingers): None, normal Lower (legs, knees, ankles, toes): None, normal, Trunk Movements Neck, shoulders, hips: None,  normal, Overall Severity Severity of abnormal movements (highest score from questions above): None, normal Incapacitation due to abnormal movements: None, normal Patient's awareness of abnormal movements (rate only patient's report): No Awareness, Dental Status Current problems with teeth and/or dentures?: No Does patient usually wear dentures?: No  CIWA:    COWS:     Musculoskeletal: Strength & Muscle Tone: within normal limits Gait & Station: normal Patient leans: N/A  Psychiatric  Specialty Exam: Physical Exam  Nursing note and vitals reviewed.   Review of Systems  Constitutional: Negative for chills and fever.  Respiratory: Negative for cough and shortness of breath.   Cardiovascular: Negative for chest pain.  Gastrointestinal: Negative for abdominal pain, heartburn, nausea and vomiting.  Psychiatric/Behavioral: Negative for depression, hallucinations and suicidal ideas. The patient is not nervous/anxious and does not have insomnia.     Blood pressure 113/80, pulse 90, temperature (!) 97.3 F (36.3 C), temperature source Oral, resp. rate 18, height 5\' 4"  (1.626 m), weight 72.6 kg (160 lb).Body mass index is 27.46 kg/m.  General Appearance: Casual and Fairly Groomed  Eye Contact:  Good  Speech:  Clear and Coherent and Normal Rate  Volume:  Normal  Mood:  Irritable  Affect:  Blunt and Constricted  Thought Process:  Coherent and Goal Directed  Orientation:  Full (Time, Place, and Person)  Thought Content:  Logical  Suicidal Thoughts:  No  Homicidal Thoughts:  No  Memory:  Immediate;   Fair Recent;   Fair Remote;   Fair  Judgement:  Poor  Insight:  Lacking  Psychomotor Activity:  Normal  Concentration:  Concentration: Fair  Recall:  [employer] of Knowledge:  Fair  Language:  Fair  Akathisia:  No  Handed:    AIMS (if indicated):     Assets:  Resilience  ADL's:  Intact  Cognition:  WNL  Sleep:  Number of Hours: 6   Treatment Plan Summary: Daily contact with  patient to assess and evaluate symptoms and progress in treatment and Medication management   -Continue inpatient hospitalization  -Schizoaffective disorder, bipolar type -Continue geodon 20mg  po BID with meals  -DMII -Discontinued metformin on 5/6 (CBG's between 80-120 and pt had been refusing)  - Anxiety/Insomnia -Continue vistaril 50mg  po q6h prn anxiety/insomnia  -Agitation -Continue zydis 5mg  po q8h prn agitation -Continue ativan 1mg  po once prn agitation  -Overactive bladder -Discontinued oxybutynin 10mg  po qhs (pt had been refusing)  -Discharge planning will be ongoing  -encourage participation in groups and therapeutic milieu  Pennelope Bracken, MD 05/13/2017, 4:21 PM

## 2017-05-14 MED ORDER — METFORMIN HCL 500 MG PO TABS: 500.0000 mg | ORAL_TABLET | Freq: Every day | ORAL | 0 refills | Status: DC

## 2017-05-14 MED ORDER — HYDROXYZINE HCL 50 MG PO TABS: 50.0000 mg | ORAL_TABLET | Freq: Four times a day (QID) | ORAL | 0 refills | Status: DC | PRN

## 2017-05-14 MED ORDER — FLUTICASONE PROPIONATE 50 MCG/ACT NA SUSP: 2.0000 | Freq: Every day | NASAL | 0 refills | Status: DC

## 2017-05-14 MED ORDER — ZIPRASIDONE HCL 20 MG PO CAPS: 20.0000 mg | ORAL_CAPSULE | Freq: Two times a day (BID) | ORAL | 0 refills | Status: DC

## 2017-05-14 NOTE — Progress Notes (Signed)
  [facility] Adult Case Management Discharge Plan :  Will you be returning to the same living situation after discharge:  No. going to stay with uncle in [place] At discharge, do you have transportation home?: Yes,  cab, train ticket Do you have the ability to pay for your medications: Yes,  mental health  Release of information consent forms completed and in the chart;  Patient's signature needed at discharge.  Patient to Follow up at: Follow-up [facility] Follow up.   Why:  I faxed your information to them.  Call them when you arrive in [place] to set up an appointment.  Take along your hospital d/c paperwork, ID and insurance information Contact information: [place] [address] [place] F: [phone]          Next level of care provider has access to [place] and Suicide Prevention discussed: Yes,  yes  Have you used any form of tobacco in the last 30 days? (Cigarettes, Smokeless Tobacco, Cigars, and/or Pipes): No  Has patient been referred to the Quitline?: N/A patient is not a smoker  Patient has been referred for addiction treatment: [place], LCSW 05/14/2017, 3:35 PM

## 2017-05-14 NOTE — Discharge Summary (Addendum)
Physician Discharge Summary Note  Patient:  [name] [name] is an 37 y.o., female  MRN:  [id number]  DOB:  1980-12-18  Patient phone:  [phone] (home)   Patient address:   [address] [place] [address],   Total Time spent with patient: Greater than 30 minutes  Date of Admission:  05/09/2017 Date of Discharge: 05--10-19  Reason for Admission: Agitation, disorganization, aggression, and having broke a window by punching it (causing laceration to her hand).  Principal Problem: Schizoaffective disorder, bipolar type [facility])  Discharge Diagnoses: Patient Active Problem List   Diagnosis Date Noted  . Schizoaffective disorder, bipolar type ([place]) [F25.0] 01/28/2015    Priority: High  . Insomnia [G47.00]   . Anxiety state [F41.1]   . Overactive bladder [N32.81]   . Diabetes mellitus ([place]) [E11.9] 02/08/2015  . Non compliance w medication regimen [Z91.14]    Past Psychiatric History: Schizoaffective disorder, Bipolar-type  Past Medical History:  Past Medical History:  Diagnosis Date  . Bipolar affective disorder, currently manic, mild ([place])   . Diabetes mellitus without complication ([place])   . Schizophrenia [facility])     Past Surgical History:  Procedure Laterality Date  . WISDOM TOOTH EXTRACTION     Family History:  Family History  Problem Relation Age of Onset  . Drug abuse Maternal Uncle    Family Psychiatric  History: See H&P  Social History:  Social History   Substance and Sexual Activity  Alcohol Use No     Social History   Substance and Sexual Activity  Drug Use No    Social History   Socioeconomic History  . Marital status: Legally Separated    Spouse name: Not on file  . Number of children: Not on file  . Years of education: Not on file  . Highest education level: Not on file  Occupational History  . Not on file  Social Needs  . Financial resource strain: Not on file  . Food insecurity:    Worry: Not on file    Inability: Not on file   . Transportation needs:    Medical: Not on file    Non-medical: Not on file  Tobacco Use  . Smoking status: Never Smoker  . Smokeless tobacco: Never Used  Substance and Sexual Activity  . Alcohol use: No  . Drug use: No  . Sexual activity: Not Currently  Lifestyle  . Physical activity:    Days per week: Not on file    Minutes per session: Not on file  . Stress: Not on file  Relationships  . Social connections:    Talks on phone: Not on file    Gets together: Not on file    Attends religious service: Not on file    Active member of club or organization: Not on file    Attends meetings of clubs or organizations: Not on file    Relationship status: Not on file  Other Topics Concern  . Not on file  Social History Narrative  . Not on file   Hospital Course: (Per Md's discharge SRA): [name] [name] is a 37 y/o F with history of schizoaffective disorder, bipolar type who was admitted on IVC after brought in to ED by police with agitation, disorganization, aggression, and having broke a window by punching it (causing laceration to her hand). Pt was irritable and uncooperative with initial interview, and she was only in agreement to be started on geodon at low dose of 20mg  po BID.Pt hadbeen refusing multiple medications including  metformin and oxybutynin (now both discontinued). She refused to have her dose of geodon adjusted. She had episodic agitation while on the unit, but overall she has demonstrated improvement of her presenting symptoms.   Besides the use of Geodon 20 mg for mood control, [name] [name] also was medicated & discharged on Hydroxyzine 50 mg prn for anxiety. She presented no other significant health issues other than seasonal allergies. She was resumed & discharged on her Flonase nasal spray. She was enrolled & participated in the group sessions being offered & held on this unit. She learned coping skills.  Today upon her discharge evaluation, pt shares, "I'm good." She  expresses having some anxiety about her plan to travel to [place] to stay with her uncle, but overall she feels that it is the best plan. Pt called her uncle with this provider and SW present to confirm that he would be available to pick her up on evening on 05/15/17, and he confirmed that he will be awaiting her arrival at the train station. Pt reports that her mood symptoms are doing well. She denies SI/HI/AH/VH. She is sleeping adequately. Her appetite is good. She is tolerating her medications without difficulty. She is in agreement to continue her current regimen without changes. Pt was recommended to establish outpatient follow up for mental health in [place] as one of her top priorities upon arrival, and she verbalized good understanding. She was able to engage in safety planning including plan to return to [facility] or contact emergency services if she feels unable to maintain her own safety or the safety of others. Pt had no further questions, comments, or concerns.  Upon discharge, [name] [name] was both mentally & medically stable. She will continue mental health care on an outpatient basis as noted below. She is provided with all the necessary information needed to make this appointment without problems. She received from the [facility] a 7 days worth, supply samples of her [facility] discharge medications. She left [facility] with all personal belongings in no apparent distress. Transportation per cab, train. [place] assisted with the transportation fares..   Physical Findings: AIMS: Facial and Oral Movements Muscles of Facial Expression: None, normal Lips and Perioral Area: None, normal Jaw: None, normal Tongue: None, normal,Extremity Movements Upper (arms, wrists, hands, fingers): None, normal Lower (legs, knees, ankles, toes): None, normal, Trunk Movements Neck, shoulders, hips: None, normal, Overall Severity Severity of abnormal movements (highest score from questions above): None, normal Incapacitation due to  abnormal movements: None, normal Patient's awareness of abnormal movements (rate only patient's report): No Awareness, Dental Status Current problems with teeth and/or dentures?: No Does patient usually wear dentures?: No  CIWA:    COWS:     Musculoskeletal: Strength & Muscle Tone: within normal limits Gait & Station: normal Patient leans: N/A  Psychiatric Specialty Exam: Physical Exam  Constitutional: She appears well-developed.  HENT:  Head: Normocephalic.  Eyes: Pupils are equal, round, and reactive to light.  Neck: Normal range of motion.  Cardiovascular: Normal rate.  Respiratory: Effort normal.  GI: Soft.  Genitourinary:  Genitourinary Comments: Deferred  Musculoskeletal: Normal range of motion.  Neurological: She is alert.  Skin: Skin is warm.    Review of Systems  Constitutional: Negative.   HENT: Negative.   Eyes: Negative.   Respiratory: Negative.   Cardiovascular: Negative.   Gastrointestinal: Negative.   Genitourinary: Negative.   Musculoskeletal: Negative.   Skin: Negative.   Neurological: Negative.   Endo/Heme/Allergies: Negative.   Psychiatric/Behavioral: Positive for depression (Stabilized with  medication prior to discharge) and hallucinations (Hx, Psychosis). Negative for memory loss, substance abuse and suicidal ideas. The patient has insomnia (Stable). The patient is not nervous/anxious.     Blood pressure 113/80, pulse 90, temperature (!) 97.3 F (36.3 C), temperature source Oral, resp. rate 18, height 5\' 4"  (1.626 m), weight 72.6 kg (160 lb).Body mass index is 27.46 kg/m.  See Md's SRA   Have you used any form of tobacco in the last 30 days? (Cigarettes, Smokeless Tobacco, Cigars, and/or Pipes): No  Has this patient used any form of tobacco in the last 30 days? (Cigarettes, Smokeless Tobacco, Cigars, and/or Pipes): N/A  Blood Alcohol level:  Lab Results  Component Value Date   ETH <10 05/08/2017   ETH <10 16/07/3708   Metabolic Disorder  Labs:  Lab Results  Component Value Date   HGBA1C 5.6 07/25/2015   MPG 114 07/25/2015   MPG 137 02/01/2015   Lab Results  Component Value Date   PROLACTIN 46.5 (H) 07/25/2015   PROLACTIN 21.6 02/01/2015   Lab Results  Component Value Date   CHOL 135 07/25/2015   TRIG 59 07/25/2015   HDL 50 07/25/2015   CHOLHDL 2.7 07/25/2015   VLDL 12 07/25/2015   LDLCALC 73 07/25/2015   LDLCALC 78 02/01/2015   See Psychiatric Specialty Exam and Suicide Risk Assessment completed by Attending Physician prior to discharge.  Discharge destination:  Home  Is patient on multiple antipsychotic therapies at discharge:  No   Has Patient had three or more failed trials of antipsychotic monotherapy by history:  No  Recommended Plan for Multiple Antipsychotic Therapies: NA  Allergies as of 05/14/2017      Reactions   Quetiapine Anaphylaxis   Diphenhydramine Hcl    Lorazepam    Risperidone Other (See Comments)   Pt reports "It makes me blind"   Valproic Acid Other (See Comments)   Pt reports "it makes me too sleepy"   Aripiprazole Other (See Comments)   Fluphenazine Rash   Haloperidol Other (See Comments)   Makes feel hot inside   Trazodone Other (See Comments)   Ziprasidone Hcl Palpitations      Medication List    STOP taking these medications   ASPIRIN PO   benztropine 1 MG tablet Commonly known as:  COGENTIN   metFORMIN 500 MG tablet Commonly known as:  GLUCOPHAGE   montelukast 10 MG tablet Commonly known as:  SINGULAIR   oxybutynin 10 MG 24 hr tablet Commonly known as:  DITROPAN-XL   promethazine-dextromethorphan 6.25-15 MG/5ML syrup Commonly known as:  PROMETHAZINE-DM   temazepam 7.5 MG capsule Commonly known as:  RESTORIL     TAKE these medications     Indication  fluticasone 50 MCG/ACT nasal spray Commonly known as:  FLONASE Place 2 sprays into both nostrils daily. For allergies What changed:  additional instructions  Indication:  Allergic Rhinitis    hydrOXYzine 50 MG tablet Commonly known as:  ATARAX/VISTARIL Take 1 tablet (50 mg total) by mouth every 6 (six) hours as needed for anxiety (sleep). What changed:    when to take this  reasons to take this  Indication:  Feeling Anxious   ziprasidone 20 MG capsule Commonly known as:  GEODON Take 1 capsule (20 mg total) by mouth 2 (two) times daily with a meal. For mood control  Indication:  Mood control       Follow-up recommendations: Activity:  As tolerated Diet: As recommended by your primary care doctor. Keep all scheduled follow-up appointments  as recommended.   Comments: Patient is instructed prior to discharge to: Take all medications as prescribed by his/her mental healthcare provider. Report any adverse effects and or reactions from the medicines to his/her outpatient provider promptly. Patient has been instructed & cautioned: To not engage in alcohol and or illegal drug use while on prescription medicines. In the event of worsening symptoms, patient is instructed to call the crisis hotline, 911 and or go to the nearest ED for appropriate evaluation and treatment of symptoms. To follow-up with his/her primary care provider for your other medical issues, concerns and or health care needs.   Signed: Lindell Spar, NP, PMHNP, FNP-BC 05/14/2017, 10:58 AM   Patient seen, Suicide Assessment Completed.  Disposition Plan Reviewed

## 2017-05-14 NOTE — Progress Notes (Signed)
Patient denies SI, Hi and AVH this shift.  Patient has been calm and cooperative, attended groups and engaged in unit activities.  Patient has had no incidents of behavioral dyscontrol.   Assess patient for safety offer medications as prescribed, engaged patient in 1:1 staff talks.   Patient able to contract for safety. Continue to monitor as planned.

## 2017-05-14 NOTE — Progress Notes (Signed)
Pt has asked that when leaving she be provided with:  Mesh panties Shower gel Bars of soap Razors Shaving cream (Pink Cap) of lotion Pads Blue Bed Pads  Vaseline Toothpaste  Tooth Brush Socks  Size 9 Sneakers Bra 40C or Large in Sports [employer]

## 2017-05-14 NOTE — BHH Group Notes (Signed)
LCSW Group Therapy Note   05/14/2017 1:15pm   Type of Therapy and Topic:  Group Therapy:  Positive Affirmations   Participation Level:  Active  Description of Group: This group addressed positive affirmation toward self and others. Patients went around the room and identified two positive things about themselves and two positive things about a peer in the room. Patients reflected on how it felt to share something positive with others, to identify positive things about themselves, and to hear positive things from others. Patients were encouraged to have a daily reflection of positive characteristics or circumstances.  Therapeutic Goals 1. Patient will verbalize two of their positive qualities 2. Patient will demonstrate empathy for others by stating two positive qualities about a peer in the group 3. Patient will verbalize their feelings when voicing positive self affirmations and when voicing positive affirmations of others 4. Patients will discuss the potential positive impact on their wellness/recovery of focusing on positive traits of self and others. Summary of Patient Progress:  Talked about graduating from college and the messages she gave herself around that.  Therapeutic Modalities Cognitive Behavioral Therapy Motivational [name] [name], [name] [name] 05/14/2017 3:36 PM

## 2017-05-14 NOTE — Progress Notes (Signed)
Adult Psychoeducational Group Note  Date:  05/14/2017 Time:  8:29 PM  Group Topic/Focus:  Wrap-Up Group:   The focus of this group is to help patients review their daily goal of treatment and discuss progress on daily workbooks.  Participation Level:  Active  Participation Quality:  Appropriate  Affect:  Appropriate  Cognitive:  Alert  Insight: Appropriate  Engagement in Group:  Engaged  Modes of Intervention:  Discussion  Additional Comments:  Patient stated having a blessed day.   Donivin Wirt L Prestina Raigoza 05/14/2017, 8:29 PM

## 2017-05-14 NOTE — Tx Team (Signed)
Interdisciplinary Treatment and Diagnostic Plan Update  05/14/2017 Time of Session: 9:15 AM  [name] [name] MRN: [id number]  Principal Diagnosis: Schizoaffective disorder, bipolar type ([place])  Secondary Diagnoses: Principal Problem:   Schizoaffective disorder, bipolar type ([place])   Current Medications:  Current Facility-Administered Medications  Medication Dose Route Frequency Provider Last Rate Last Dose  . acetaminophen (TYLENOL) tablet 650 mg  650 mg Oral Q6H PRN Patrecia Pour, NP      . alum & mag hydroxide-simeth (MAALOX/MYLANTA) 200-200-20 MG/5ML suspension 30 mL  30 mL Oral Q4H PRN Patrecia Pour, NP      . hydrOXYzine (ATARAX/VISTARIL) tablet 50 mg  50 mg Oral Q6H PRN Pennelope Bracken, MD      . OLANZapine zydis (ZYPREXA) disintegrating tablet 5 mg  5 mg Oral Q8H PRN Derrill Center, NP       And  . LORazepam (ATIVAN) tablet 1 mg  1 mg Oral PRN Derrill Center, NP      . magnesium hydroxide (MILK OF MAGNESIA) suspension 30 mL  30 mL Oral Daily PRN Patrecia Pour, NP   30 mL at 05/12/17 0908  . ziprasidone (GEODON) capsule 20 mg  20 mg Oral BID WC Patrecia Pour, NP   20 mg at 05/13/17 1702    PTA Medications: Medications Prior to Admission  Medication Sig Dispense Refill Last Dose  . ASPIRIN PO Take by mouth.   Past Month at Unknown time  . benztropine (COGENTIN) 1 MG tablet Take 1 tablet (1 mg total) by mouth at bedtime. For prevention of drug induced tremors (Patient not taking: Reported on 05/08/2017) 30 tablet 0 Not Taking at Unknown time  . fluticasone (FLONASE) 50 MCG/ACT nasal spray Place 2 sprays into both nostrils daily. (Patient not taking: Reported on 05/08/2017) 16 g 2 Not Taking at Unknown time  . hydrOXYzine (ATARAX/VISTARIL) 50 MG tablet Take 1 tablet (50 mg total) by mouth 3 (three) times daily as needed for anxiety. (Patient not taking: Reported on 05/08/2017) 60 tablet 0 Not Taking at Unknown time  . metFORMIN (GLUCOPHAGE) 500 MG tablet Take 1  tablet (500 mg total) by mouth daily with breakfast. For diabetes management (Patient not taking: Reported on 05/08/2017) 10 tablet 0 Not Taking at Unknown time  . montelukast (SINGULAIR) 10 MG tablet Take 1 tablet (10 mg total) by mouth at bedtime. 30 tablet 0   . oxybutynin (DITROPAN-XL) 10 MG 24 hr tablet Take 1 tablet (10 mg total) by mouth at bedtime. For over active bladder (Patient not taking: Reported on 05/08/2017)   Not Taking at Unknown time  . promethazine-dextromethorphan (PROMETHAZINE-DM) 6.25-15 MG/5ML syrup Take 5 mLs by mouth 3 (three) times daily as needed for cough. (Patient not taking: Reported on 05/08/2017) 118 mL 0 Not Taking at Unknown time  . temazepam (RESTORIL) 7.5 MG capsule Take 1 capsule (7.5 mg total) by mouth at bedtime. For sleep (Patient not taking: Reported on 05/08/2017) 7 capsule 0 Not Taking at Unknown time    Patient Stressors: Marital or family conflict Medication change or noncompliance  Patient Strengths: Ability for insight Average or above average intelligence General fund of knowledge  Treatment Modalities: Medication Management, Group therapy, Case management,  1 to 1 session with clinician, Psychoeducation, Recreational therapy.   Physician Treatment Plan for Primary Diagnosis: Schizoaffective disorder, bipolar type ([place]) Long Term Goal(s): Improvement in symptoms so as ready for discharge  Short Term Goals: Ability to identify changes in lifestyle to reduce recurrence of condition  will improve Ability to demonstrate self-control will improve Compliance with prescribed medications will improve Ability to verbalize feelings will improve Ability to demonstrate self-control will improve Ability to identify and develop effective coping behaviors will improve  Medication Management: Evaluate patient's response, side effects, and tolerance of medication regimen.  Therapeutic Interventions: 1 to 1 sessions, Unit Group sessions and Medication  administration.  Evaluation of Outcomes: Adequate for Discharge  Physician Treatment Plan for Secondary Diagnosis: Principal Problem:   Schizoaffective disorder, bipolar type ([place])   Long Term Goal(s): Improvement in symptoms so as ready for discharge  Short Term Goals: Ability to identify changes in lifestyle to reduce recurrence of condition will improve Ability to demonstrate self-control will improve Compliance with prescribed medications will improve Ability to verbalize feelings will improve Ability to demonstrate self-control will improve Ability to identify and develop effective coping behaviors will improve  Medication Management: Evaluate patient's response, side effects, and tolerance of medication regimen.  Therapeutic Interventions: 1 to 1 sessions, Unit Group sessions and Medication administration.  Evaluation of Outcomes: Adequate for Discharge   RN Treatment Plan for Primary Diagnosis: Schizoaffective disorder, bipolar type ([place]) Long Term Goal(s): Knowledge of disease and therapeutic regimen to maintain health will improve  Short Term Goals: Ability to identify and develop effective coping behaviors will improve and Compliance with prescribed medications will improve  Medication Management: RN will administer medications as ordered by provider, will assess and evaluate patient's response and provide education to patient for prescribed medication. RN will report any adverse and/or side effects to prescribing provider.  Therapeutic Interventions: 1 on 1 counseling sessions, Psychoeducation, Medication administration, Evaluate responses to treatment, Monitor vital signs and CBGs as ordered, Perform/monitor CIWA, COWS, AIMS and Fall Risk screenings as ordered, Perform wound care treatments as ordered.  Evaluation of Outcomes: Adequate for Discharge   LCSW Treatment Plan for Primary Diagnosis: Schizoaffective disorder, bipolar type ([place]) Long Term Goal(s): Safe  transition to appropriate next level of care at discharge, Engage patient in therapeutic group addressing interpersonal concerns.  Short Term Goals: Engage patient in aftercare planning with referrals and resources  Therapeutic Interventions: Assess for all discharge needs, 1 to 1 time with Social worker, Explore available resources and support systems, Assess for adequacy in community support network, Educate family and significant other(s) on suicide prevention, Complete Psychosocial Assessment, Interpersonal group therapy.  Evaluation of Outcomes: Met  States she wants to get to [place] to stay with family, and admits she has not yet been seeing any outpatient providers 5/9: Pt will take train in AM to [place], where uncle will pick up-confirmed by phone.  Follow up at [place] in Treatment: Attending groups: Yes Participating in groups: Yes Taking medication as prescribed: Yes Toleration medication: Yes, no side effects reported at this time Family/Significant other contact made: Yes Patient understands diagnosis: Yes AEB Discussing patient identified problems/goals with staff: Yes Medical problems stabilized or resolved: Yes Denies suicidal/homicidal ideation: Yes Issues/concerns per patient self-inventory: None Other: N/A  New problem(s) identified: None identified at this time.   New Short Term/Long Term Goal(s): "I want to go home.  My plan is to go stay with my Uncle in [place]. I like the medication the way it is.  Don't change anything."  Discharge Plan or Barriers:   Reason for Continuation of Hospitalization:   Medication stabilization   Estimated Length of Stay: D/C tomorrow  Attendees: Patient:  05/14/2017  9:15 AM  Physician: Maris Berger, MD 05/14/2017  9:15  AM  Nursing: Sena Hitch, RN 05/14/2017  9:15 AM  RN Care Manager: Lars Pinks, RN 05/14/2017  9:15 AM  Social Worker: Ripley Fraise 05/14/2017  9:15 AM  Recreational  Therapist: Winfield Cunas 05/14/2017  9:15 AM  Other: Norberto Sorenson 05/14/2017  9:15 AM  Other:  05/14/2017  9:15 AM    Scribe for Treatment Team:  Roque Lias LCSW 05/14/2017 9:15 AM
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
## 2017-05-14 NOTE — Progress Notes (Addendum)
Pt refuses CBG and BP and demands three new outfits.

## 2017-05-14 NOTE — Progress Notes (Signed)
Recreation Therapy Notes  Date: 5.9.19 Time: 1000 Location: 500 Hall Dayroom  Group Topic: Communication, Team Building, Problem Solving  Goal Area(s) Addresses:  Patient will effectively work with peer towards shared goal.  Patient will identify skill used to make activity successful.  Patient will identify how skills used during activity can be used to reach post d/c goals.   Behavioral Response: Disruptive  Intervention: STEM Activity   Activity: Straw Bridge. In groups of 2, groups were to build a standing bridge that could hold the weight of a 60 piece puzzle box.  Each group was given 20 straws and a long piece of masking tape.  Education: Education officer, community, Dentist.   Education Outcome: Acknowledges education/In group clarification offered/Needs additional education.   Clinical Observations/Feedback: Pt came to group late and was told she assist one of the other groups.  Pt wanted to do her own thing and decided she was going try and take supplies and do what she wanted.  Pt was redirected and left.  Pt came back while LRT was processing with the group and took some of the supplies.  LRT took the supplies from pt and pt became upset and argumentative. Pt was put out of group and did not return.    Victorino Sparrow, LRT/CTRS      Victorino Sparrow A 05/14/2017 12:37 PM

## 2017-05-15 NOTE — Progress Notes (Signed)
Patient ID: [name] [name], female   DOB: May 29, 1980, 37 y.o.   MRN: [id number]   Pt. Discharged per MD orders;  PT. Currently denies any HI/SI or AVH.  Pt. Was given education regarding follow up appointments and medications by RN.  Pt. Denies any questions or concerns about the medications.  Pt. Was escorted to the search room to retrieve her belongings by RN before being discharged to the hospital lobby.

## 2017-05-17 DIAGNOSIS — Z046 Encounter for general psychiatric examination, requested by authority: Secondary | ICD-10-CM | POA: Diagnosis not present

## 2017-05-30 DIAGNOSIS — F250 Schizoaffective disorder, bipolar type: Secondary | ICD-10-CM | POA: Diagnosis not present

## 2017-05-31 DIAGNOSIS — F250 Schizoaffective disorder, bipolar type: Secondary | ICD-10-CM | POA: Diagnosis not present

## 2017-06-01 DIAGNOSIS — F250 Schizoaffective disorder, bipolar type: Secondary | ICD-10-CM | POA: Diagnosis not present

## 2017-06-02 DIAGNOSIS — F250 Schizoaffective disorder, bipolar type: Secondary | ICD-10-CM | POA: Diagnosis not present

## 2017-06-03 DIAGNOSIS — F259 Schizoaffective disorder, unspecified: Secondary | ICD-10-CM | POA: Diagnosis not present

## 2017-06-03 DIAGNOSIS — F250 Schizoaffective disorder, bipolar type: Secondary | ICD-10-CM | POA: Diagnosis not present

## 2017-06-03 DIAGNOSIS — Z9114 Patient's other noncompliance with medication regimen: Secondary | ICD-10-CM | POA: Diagnosis not present

## 2017-06-03 DIAGNOSIS — F602 Antisocial personality disorder: Secondary | ICD-10-CM | POA: Diagnosis not present

## 2017-06-03 DIAGNOSIS — F312 Bipolar disorder, current episode manic severe with psychotic features: Secondary | ICD-10-CM | POA: Diagnosis not present

## 2017-06-03 DIAGNOSIS — F99 Mental disorder, not otherwise specified: Secondary | ICD-10-CM | POA: Diagnosis not present

## 2017-06-03 DIAGNOSIS — F200 Paranoid schizophrenia: Secondary | ICD-10-CM | POA: Diagnosis not present

## 2017-06-03 DIAGNOSIS — Z590 Homelessness: Secondary | ICD-10-CM | POA: Diagnosis not present

## 2017-07-10 ENCOUNTER — Encounter (HOSPITAL_COMMUNITY): Payer: Self-pay

## 2017-07-10 ENCOUNTER — Emergency Department (HOSPITAL_COMMUNITY)
Admission: EM | Admit: 2017-07-10 | Discharge: 2017-07-12 | Payer: Medicare HMO | Attending: Emergency Medicine | Admitting: Emergency Medicine

## 2017-07-10 DIAGNOSIS — F419 Anxiety disorder, unspecified: Secondary | ICD-10-CM | POA: Insufficient documentation

## 2017-07-10 DIAGNOSIS — F209 Schizophrenia, unspecified: Secondary | ICD-10-CM | POA: Diagnosis not present

## 2017-07-10 DIAGNOSIS — E119 Type 2 diabetes mellitus without complications: Secondary | ICD-10-CM | POA: Insufficient documentation

## 2017-07-10 DIAGNOSIS — F99 Mental disorder, not otherwise specified: Secondary | ICD-10-CM | POA: Diagnosis present

## 2017-07-10 DIAGNOSIS — F3113 Bipolar disorder, current episode manic without psychotic features, severe: Secondary | ICD-10-CM | POA: Insufficient documentation

## 2017-07-10 DIAGNOSIS — F4325 Adjustment disorder with mixed disturbance of emotions and conduct: Secondary | ICD-10-CM | POA: Diagnosis not present

## 2017-07-10 DIAGNOSIS — Z79899 Other long term (current) drug therapy: Secondary | ICD-10-CM | POA: Diagnosis not present
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
## 2017-07-11 ENCOUNTER — Other Ambulatory Visit: Payer: Self-pay

## 2017-07-11 DIAGNOSIS — F4325 Adjustment disorder with mixed disturbance of emotions and conduct: Secondary | ICD-10-CM

## 2017-07-11 LAB — COMPREHENSIVE METABOLIC PANEL
ALBUMIN: 3.9 g/dL (ref 3.5–5.0)
ALK PHOS: 42 U/L (ref 38–126)
ALT: 19 U/L (ref 0–44)
AST: 24 U/L (ref 15–41)
Anion gap: 11 (ref 5–15)
BILIRUBIN TOTAL: 0.7 mg/dL (ref 0.3–1.2)
BUN: 17 mg/dL (ref 6–20)
CO2: 21 mmol/L — AB (ref 22–32)
Calcium: 9.1 mg/dL (ref 8.9–10.3)
Chloride: 106 mmol/L (ref 98–111)
Creatinine, Ser: 0.84 mg/dL (ref 0.44–1.00)
GFR calc Af Amer: >60 mL/min (ref >60)
Glucose, Bld: 89 mg/dL (ref 70–99)
POTASSIUM: 3.8 mmol/L (ref 3.5–5.1)
Sodium: 138 mmol/L (ref 135–145)
Total Protein: 7.7 g/dL (ref 6.5–8.1)

## 2017-07-11 LAB — SALICYLATE LEVEL: Salicylate Lvl: <7 mg/dL (ref 2.8–30.0)

## 2017-07-11 LAB — ETHANOL: Alcohol, Ethyl (B): <10 mg/dL (ref <10)

## 2017-07-11 LAB — CBC WITH DIFFERENTIAL/PLATELET
BASOS PCT: 1 %
Basophils Absolute: 0 10*3/uL (ref 0.0–0.1)
Eosinophils Absolute: 0 10*3/uL (ref 0.0–0.7)
Eosinophils Relative: 1 %
HEMATOCRIT: 34.2 % — AB (ref 36.0–46.0)
Hemoglobin: 11.7 g/dL — ABNORMAL LOW (ref 12.0–15.0)
LYMPHS PCT: 35 %
Lymphs Abs: 2.1 10*3/uL (ref 0.7–4.0)
MCH: 32.2 pg (ref 26.0–34.0)
MCHC: 34.2 g/dL (ref 30.0–36.0)
MCV: 94.2 fL (ref 78.0–100.0)
MONO ABS: 0.5 10*3/uL (ref 0.1–1.0)
MONOS PCT: 9 %
Neutro Abs: 3.3 10*3/uL (ref 1.7–7.7)
Neutrophils Relative %: 54 %
Platelets: 273 10*3/uL (ref 150–400)
RBC: 3.63 MIL/uL — ABNORMAL LOW (ref 3.87–5.11)
RDW: 13.9 % (ref 11.5–15.5)
WBC: 6 10*3/uL (ref 4.0–10.5)

## 2017-07-11 LAB — RAPID URINE DRUG SCREEN, HOSP PERFORMED
Amphetamines: NOT DETECTED
BENZODIAZEPINES: NOT DETECTED
COCAINE: NOT DETECTED
OPIATES: NOT DETECTED
TETRAHYDROCANNABINOL: NOT DETECTED

## 2017-07-11 LAB — ACETAMINOPHEN LEVEL: Acetaminophen (Tylenol), Serum: <10 ug/mL — ABNORMAL LOW (ref 10–30)

## 2017-07-11 LAB — POC URINE PREG, ED: Preg Test, Ur: NEGATIVE

## 2017-07-11 MED ORDER — STERILE WATER FOR INJECTION IJ SOLN
INTRAMUSCULAR | Status: AC
  Administered 2017-07-11: 1.2 mL
  Filled 2017-07-11: qty 10

## 2017-07-11 MED ORDER — LIP MEDEX EX OINT
TOPICAL_OINTMENT | CUTANEOUS | Status: AC
  Administered 2017-07-11: 11:00:00
  Filled 2017-07-11: qty 7

## 2017-07-11 MED ORDER — ZIPRASIDONE MESYLATE 20 MG IM SOLR
20.0000 mg | Freq: Once | INTRAMUSCULAR | Status: AC
Start: 2017-07-11 — End: 2017-07-11
  Administered 2017-07-11: 20 mg via INTRAMUSCULAR
  Filled 2017-07-11: qty 20
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
## 2017-07-11 NOTE — Discharge Instructions (Signed)
Follow up at:  [facility]  For medication management and therapy
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
## 2017-07-11 NOTE — Progress Notes (Addendum)
Patient received from Triage with 4 [employer] officers and one Animal nutritionist.  Unable to remove ring from pt's left hand. It is a pink glass on a silver colored band. Patient has had Geodon so easily settles back down but states the ring is a religous ring and we are not allowed to remove it.  Security tried to remove while pt sedated but unable to remove it. Will report to next shift and ask patient later in morning. She has been seen by TTS and will be cleared for In Patient treatment. No offers currently. Our [facility] can not take her currently. Given food and blanket and offered bathroom. Sitter at side. Hoyle Barr, RN-C  [phone] staffing notified me that IVC orders and IVC sitter orders not in. Can not place more than one hour. Order written for [phone] but patient has been ivc'd and has had sitter since 0345.

## 2017-07-11 NOTE — ED Provider Notes (Signed)
Patient seen by Dr. Ralene Bathe. Labs reassuring. SANE RN to see, and pt IVC'ed for Psych placement. Medically stable.   Duffy Bruce, MD 07/11/17 [phone]

## 2017-07-11 NOTE — ED Notes (Signed)
Sane Nurse [facility]) notified for consult. Heafner, RN  states she is currently at [facility] handling another case which will take her approximately 6 hours.  Sane nurse will notify writer if something changes.

## 2017-07-11 NOTE — ED Notes (Signed)
Patient discharged into the custody of  [employer]. Patient transported to jail

## 2017-07-11 NOTE — SANE Note (Signed)
I was contacted per telecon at 10:28 this morning by Maudie Mercury, RN,  at [facility].  Kim,RN states patient has not been evaluated by SANE. Kearney Hard, RN that this RN is currently evaluating other patient and will be at least 6 hours and will notify when this RN finishes evaluation. Maudie Mercury, RN verbalized understanding. This RN called [facility] to speak to primary RN at 1441. Reviewed prior note from SANE and instructed Kim,RN that consult was provided to patient and patient refused. Kim,RN verbalized understanding and will notify MD that SANE consult was made at 0213. No further action required by this RN.

## 2017-07-14 ENCOUNTER — Other Ambulatory Visit: Payer: Self-pay

## 2017-07-14 NOTE — Patient Outreach (Signed)
[facility]) Care Management  07/14/2017  [name] [name] [birth year] [id number]   Telephone Screen Referral Date : 07/14/2017 Referral Source:[facility] ED Census Referral Reason: ED Utilization Insurance: [employer]  Due to the patient's situation in the  ED on 07/10/2017 [facility] will not be able to offer services at this time.  Plan:  RN Health Coach will close the case at the time due to the patient is not eligible for services per note on 07/10/2017.  Lazaro Arms RN, BSN, [place] Direct Dial:  [phone]  Fax: [phone]

## 2017-08-25 ENCOUNTER — Encounter: Payer: Self-pay | Admitting: Pediatric Intensive Care

## 2017-08-25 DIAGNOSIS — E119 Type 2 diabetes mellitus without complications: Secondary | ICD-10-CM

## 2017-08-25 LAB — GLUCOSE, POCT (MANUAL RESULT ENTRY): POC GLUCOSE: 86 mg/dL (ref 70–99)

## 2017-08-31 DIAGNOSIS — Z8639 Personal history of other endocrine, nutritional and metabolic disease: Secondary | ICD-10-CM | POA: Diagnosis not present

## 2017-08-31 DIAGNOSIS — Z862 Personal history of diseases of the blood and blood-forming organs and certain disorders involving the immune mechanism: Secondary | ICD-10-CM | POA: Diagnosis not present

## 2017-08-31 DIAGNOSIS — Z3201 Encounter for pregnancy test, result positive: Secondary | ICD-10-CM | POA: Diagnosis not present

## 2017-08-31 DIAGNOSIS — R5383 Other fatigue: Secondary | ICD-10-CM | POA: Diagnosis not present

## 2017-09-01 ENCOUNTER — Encounter: Payer: Self-pay | Admitting: Pediatric Intensive Care

## 2017-09-15 NOTE — Congregational Nurse Program (Signed)
New client encounter. Client states history of insulin dependent diabetes but she has been off of all medication for several years. Client states she has been experiencing "fatigue" and would like BG check. Client would also like to submit SCAT application. Follow up in clinic to fill out application.

## 2017-09-15 NOTE — Congregational Nurse Program (Signed)
Reviewed SCAT application. CN advised client that determination for SCAT eligibility would be made by [facility] and NOT the CN clinic. Client states understanding.

## 2017-10-06 ENCOUNTER — Emergency Department (HOSPITAL_COMMUNITY): Payer: Medicare HMO

## 2017-10-06 ENCOUNTER — Encounter (HOSPITAL_COMMUNITY): Payer: Self-pay | Admitting: *Deleted

## 2017-10-06 ENCOUNTER — Emergency Department (HOSPITAL_COMMUNITY)
Admission: EM | Admit: 2017-10-06 | Discharge: 2017-10-07 | Disposition: A | Discharge status: Other Acute Inpatient Hospital | Payer: Medicare HMO | Attending: Emergency Medicine | Admitting: Emergency Medicine

## 2017-10-06 ENCOUNTER — Encounter (HOSPITAL_COMMUNITY): Payer: Self-pay | Admitting: Emergency Medicine

## 2017-10-06 ENCOUNTER — Inpatient Hospital Stay: Admission: RE | Admit: 2017-10-06 | Payer: Medicare HMO | Source: Intra-hospital | Admitting: Psychiatry

## 2017-10-06 ENCOUNTER — Encounter: Payer: Self-pay | Admitting: Psychiatry

## 2017-10-06 DIAGNOSIS — Z79899 Other long term (current) drug therapy: Secondary | ICD-10-CM | POA: Insufficient documentation

## 2017-10-06 DIAGNOSIS — Z3A10 10 weeks gestation of pregnancy: Secondary | ICD-10-CM | POA: Diagnosis not present

## 2017-10-06 DIAGNOSIS — F3113 Bipolar disorder, current episode manic without psychotic features, severe: Secondary | ICD-10-CM | POA: Insufficient documentation

## 2017-10-06 DIAGNOSIS — O99340 Other mental disorders complicating pregnancy, unspecified trimester: Secondary | ICD-10-CM | POA: Insufficient documentation

## 2017-10-06 DIAGNOSIS — F29 Unspecified psychosis not due to a substance or known physiological condition: Secondary | ICD-10-CM | POA: Diagnosis not present

## 2017-10-06 DIAGNOSIS — Z008 Encounter for other general examination: Secondary | ICD-10-CM | POA: Insufficient documentation

## 2017-10-06 DIAGNOSIS — F250 Schizoaffective disorder, bipolar type: Secondary | ICD-10-CM | POA: Diagnosis not present

## 2017-10-06 DIAGNOSIS — O99321 Drug use complicating pregnancy, first trimester: Secondary | ICD-10-CM | POA: Diagnosis not present

## 2017-10-06 DIAGNOSIS — R451 Restlessness and agitation: Secondary | ICD-10-CM | POA: Diagnosis not present

## 2017-10-06 DIAGNOSIS — O3680X0 Pregnancy with inconclusive fetal viability, not applicable or unspecified: Secondary | ICD-10-CM | POA: Diagnosis not present

## 2017-10-06 DIAGNOSIS — Z046 Encounter for general psychiatric examination, requested by authority: Secondary | ICD-10-CM | POA: Diagnosis not present

## 2017-10-06 DIAGNOSIS — O24111 Pre-existing diabetes mellitus, type 2, in pregnancy, first trimester: Secondary | ICD-10-CM | POA: Diagnosis not present

## 2017-10-06 DIAGNOSIS — O99341 Other mental disorders complicating pregnancy, first trimester: Secondary | ICD-10-CM | POA: Diagnosis not present

## 2017-10-06 LAB — COMPREHENSIVE METABOLIC PANEL
ALT: 27 U/L (ref 0–44)
AST: 53 U/L — AB (ref 15–41)
Albumin: 4.4 g/dL (ref 3.5–5.0)
Alkaline Phosphatase: 35 U/L — ABNORMAL LOW (ref 38–126)
Anion gap: 12 (ref 5–15)
BUN: 25 mg/dL — ABNORMAL HIGH (ref 6–20)
CHLORIDE: 107 mmol/L (ref 98–111)
CO2: 20 mmol/L — AB (ref 22–32)
CREATININE: 0.88 mg/dL (ref 0.44–1.00)
Calcium: 9.8 mg/dL (ref 8.9–10.3)
Glucose, Bld: 74 mg/dL (ref 70–99)
POTASSIUM: 3.5 mmol/L (ref 3.5–5.1)
SODIUM: 139 mmol/L (ref 135–145)
Total Bilirubin: 1.3 mg/dL — ABNORMAL HIGH (ref 0.3–1.2)
Total Protein: 8.4 g/dL — ABNORMAL HIGH (ref 6.5–8.1)

## 2017-10-06 LAB — URINALYSIS, ROUTINE W REFLEX MICROSCOPIC
BILIRUBIN URINE: NEGATIVE
Glucose, UA: NEGATIVE mg/dL
Hgb urine dipstick: NEGATIVE
KETONES UR: 80 mg/dL — AB
Leukocytes, UA: NEGATIVE
Nitrite: NEGATIVE
PH: 5 (ref 5.0–8.0)
Protein, ur: 30 mg/dL — AB
Specific Gravity, Urine: 1.029 (ref 1.005–1.030)

## 2017-10-06 LAB — CBC
HCT: 32.5 % — ABNORMAL LOW (ref 36.0–46.0)
HEMOGLOBIN: 11.4 g/dL — AB (ref 12.0–15.0)
MCH: 32.5 pg (ref 26.0–34.0)
MCHC: 35.1 g/dL (ref 30.0–36.0)
MCV: 92.6 fL (ref 78.0–100.0)
PLATELETS: 291 10*3/uL (ref 150–400)
RBC: 3.51 MIL/uL — AB (ref 3.87–5.11)
RDW: 13.2 % (ref 11.5–15.5)
WBC: 7.3 10*3/uL (ref 4.0–10.5)

## 2017-10-06 LAB — ETHANOL

## 2017-10-06 LAB — PREGNANCY, URINE: Preg Test, Ur: POSITIVE — AB

## 2017-10-06 LAB — RAPID URINE DRUG SCREEN, HOSP PERFORMED
Amphetamines: NOT DETECTED
BENZODIAZEPINES: NOT DETECTED
Barbiturates: NOT DETECTED
Cocaine: NOT DETECTED
OPIATES: NOT DETECTED
TETRAHYDROCANNABINOL: NOT DETECTED

## 2017-10-06 MED ORDER — OLANZAPINE 5 MG PO TBDP
5.0000 mg | ORAL_TABLET | Freq: Two times a day (BID) | ORAL | Status: DC | PRN
  Administered 2017-10-07: 5 mg via ORAL
  Filled 2017-10-06 (×2): qty 1

## 2017-10-06 MED ORDER — BENZTROPINE MESYLATE 0.5 MG PO TABS
0.5000 mg | ORAL_TABLET | Freq: Two times a day (BID) | ORAL | Status: DC | PRN
  Filled 2017-10-06: qty 1

## 2017-10-06 MED ORDER — BENZTROPINE MESYLATE 1 MG/ML IJ SOLN: 0.5000 mg | Freq: Two times a day (BID) | INTRAMUSCULAR | Status: DC | PRN
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
## 2017-10-06 NOTE — BH Assessment (Signed)
Patient has been accepted to [facility]  Accepting physician is Dr. Mellody Dance.  Attending Physician will be Dr. Bary Leriche.  Patient has been assigned to room 307, by [facility] Charge Nurse T'Yawn.  Call report to [phone].  Representative/Transfer Coordinator is Dispensing optician Patient pre-admitted by [facility] Patient Access [facility])  [facility] Staff [facility]) made aware of acceptance.

## 2017-10-06 NOTE — ED Notes (Signed)
Attempted to give patient po medications.  Patient refused and was talking but nurse could not understand what she was saying.  Still refusing blood draw as well.

## 2017-10-06 NOTE — ED Notes (Signed)
Patient continues to respond to internal stimuli having conversations alone in her room.  Sitter at bedside.  Patient can be visualized by nurse.

## 2017-10-06 NOTE — ED Notes (Signed)
Bed: WA26 Expected date:  Expected time:  Means of arrival:  Comments: 

## 2017-10-06 NOTE — ED Provider Notes (Addendum)
Patient care assumed at 1600, under IVC labs are pending. Labs without acute abnormalities. Patient is paranoid on evaluation, flight of ideas. She is non-toxic appearing on examination. She has been medically cleared for psychiatric evaluation and treatment.   Quintella Reichert, MD 10/06/17 1959  Urine pregnancy test is positive. Patient denies any current abdominal pain. She is unsure of her last menstrual cycle. Ultrasound obtained to evaluate for IUP and estimated gestational age. Ultrasound demonstrate IUP at 10 weeks and five days. Patient updated of ultrasound findings.   Quintella Reichert, MD 10/07/17 0005

## 2017-10-06 NOTE — ED Notes (Signed)
Techs were able to get basic labs except for red top.  Patient crying during procedure.

## 2017-10-06 NOTE — ED Notes (Signed)
Patient refused blood draw. Will try again later with security assistance.

## 2017-10-06 NOTE — ED Notes (Signed)
[place] tech in with pt.

## 2017-10-06 NOTE — ED Notes (Signed)
Patient still refusing blood draws.

## 2017-10-06 NOTE — ED Notes (Signed)
Patient talking to herself in a low voice.  Seems to be responding to internal stimuli.

## 2017-10-06 NOTE — ED Triage Notes (Signed)
Patient here from [facility] in the parking lot with pastor wandering around and talking loudly. Reports patient has been off meds for 5 months. Hx of bipolar, schizophrenia. Trying to jump into traffic.

## 2017-10-06 NOTE — ED Provider Notes (Signed)
[facility] DEPT Provider Note   CSN: [id number] Arrival date & time: 10/06/17  1200     History   Chief Complaint Chief Complaint  Patient presents with  . Medical Clearance    HPI [name] [name] is a 37 y.o. female.  Patient with hx schizoaffective disorder, presents with IVC. Per report pt with bizarre behavior, threatening others, psychotic behavior. Pt not responsive to questions - level 5 caveat.   The history is provided by the patient and the police. The history is limited by the condition of the patient.    Past Medical History:  Diagnosis Date  . Bipolar affective disorder, currently manic, mild ([place])   . Diabetes mellitus without complication ([place])   . Schizophrenia [facility])     Patient Active Problem List   Diagnosis Date Noted  . Adjustment disorder with mixed disturbance of emotions and conduct 07/11/2017  . Insomnia   . Anxiety state   . Overactive bladder   . Diabetes mellitus ([place]) 02/08/2015  . Schizoaffective disorder, bipolar type ([place]) 01/28/2015  . Non compliance w medication regimen     Past Surgical History:  Procedure Laterality Date  . WISDOM TOOTH EXTRACTION       OB History   None      Home Medications    Prior to Admission medications   Medication Sig Start Date End Date Taking? Authorizing Provider  fluticasone (FLONASE) 50 MCG/ACT nasal spray Place 2 sprays into both nostrils daily. For allergies 05/14/17   Lindell Spar I, NP  hydrOXYzine (ATARAX/VISTARIL) 50 MG tablet Take 1 tablet (50 mg total) by mouth every 6 (six) hours as needed for anxiety (sleep). 05/14/17   Lindell Spar I, NP  ziprasidone (GEODON) 20 MG capsule Take 1 capsule (20 mg total) by mouth 2 (two) times daily with a meal. For mood control 05/14/17   Encarnacion Slates, NP    Family History Family History  Problem Relation Age of Onset  . Drug abuse Maternal Uncle     Social History Social History   Tobacco Use  . Smoking  status: Never Smoker  . Smokeless tobacco: Never Used  Substance Use Topics  . Alcohol use: No  . Drug use: No     Allergies   Quetiapine; Diphenhydramine hcl; Lorazepam; Risperidone; Valproic acid; Aripiprazole; Fluphenazine; Haloperidol; Trazodone; and Ziprasidone hcl   Review of Systems Review of Systems  Unable to perform ROS: Psychiatric disorder  level 5 caveat   Physical Exam Updated Vital Signs There were no vitals taken for this visit.  Physical Exam  Constitutional: She appears well-developed and well-nourished.  HENT:  Head: Atraumatic.  Eyes: Pupils are equal, round, and reactive to light. Conjunctivae are normal. No scleral icterus.  Neck: Neck supple. No tracheal deviation present.  Cardiovascular: Normal rate, regular rhythm, normal heart sounds and intact distal pulses.  Pulmonary/Chest: Effort normal and breath sounds normal. No respiratory distress.  Abdominal: Normal appearance. She exhibits no distension. There is no tenderness.  Musculoskeletal: She exhibits no edema.  Neurological: She is alert.  Speech pressured, fluent. Steady gait.   Skin: Skin is warm and dry. No rash noted.  Psychiatric:  Pt with pressured speech, rapidly moves from one unrelated subjected to next, at times talks with accent, at other times not. Makes rapid movements w bil arms/hands as talks. Talks about being married 1000x, then jibberish speech about many topics, changing subject every few words.   Nursing note and vitals reviewed.    ED  Treatments / Results  Labs (all labs ordered are listed, but only abnormal results are displayed) Results for orders placed or performed in visit on 08/25/17  POCT glucose  Result Value Ref Range   POC Glucose 86 70 - 99 mg/dl   No results found.  EKG None  Radiology No results found.  Procedures Procedures (including critical care time)  Medications Ordered in ED Medications - No data to display   Initial Impression /  Assessment and Plan / ED Course  I have reviewed the triage vital signs and the nursing notes.  Pertinent labs & imaging results that were available during my care of the patient were reviewed by me and considered in my medical decision making (see chart for details).  Labs sent.  [place] team consulted.  Reviewed nursing notes and prior charts for additional history.   Psychiatric medication management and disposition per [facility] team - pt appears to have acute psychosis and will need inpatient BH tx.   Labs pending - signed out to Dr Gillian Scarce to check labs when back.  Dispo per [facility] team.     Final Clinical Impressions(s) / ED Diagnoses   Final diagnoses:  None    ED Discharge Orders    None       Lajean Saver, MD 10/06/17 1650

## 2017-10-06 NOTE — ED Notes (Signed)
Patient still refusing for blood to be drawn.  Charge nurse notified.

## 2017-10-07 DIAGNOSIS — Z3A10 10 weeks gestation of pregnancy: Secondary | ICD-10-CM | POA: Diagnosis not present

## 2017-10-07 DIAGNOSIS — K5900 Constipation, unspecified: Secondary | ICD-10-CM | POA: Diagnosis not present

## 2017-10-07 DIAGNOSIS — Z79899 Other long term (current) drug therapy: Secondary | ICD-10-CM | POA: Diagnosis not present

## 2017-10-07 DIAGNOSIS — T43596A Underdosing of other antipsychotics and neuroleptics, initial encounter: Secondary | ICD-10-CM | POA: Diagnosis not present

## 2017-10-07 DIAGNOSIS — F312 Bipolar disorder, current episode manic severe with psychotic features: Secondary | ICD-10-CM | POA: Diagnosis not present

## 2017-10-07 DIAGNOSIS — Z008 Encounter for other general examination: Secondary | ICD-10-CM | POA: Diagnosis not present

## 2017-10-07 DIAGNOSIS — E119 Type 2 diabetes mellitus without complications: Secondary | ICD-10-CM | POA: Diagnosis not present

## 2017-10-07 DIAGNOSIS — F250 Schizoaffective disorder, bipolar type: Secondary | ICD-10-CM

## 2017-10-07 DIAGNOSIS — F209 Schizophrenia, unspecified: Secondary | ICD-10-CM | POA: Diagnosis not present

## 2017-10-07 DIAGNOSIS — O24111 Pre-existing diabetes mellitus, type 2, in pregnancy, first trimester: Secondary | ICD-10-CM | POA: Diagnosis not present

## 2017-10-07 DIAGNOSIS — Z046 Encounter for general psychiatric examination, requested by authority: Secondary | ICD-10-CM | POA: Diagnosis not present

## 2017-10-07 DIAGNOSIS — Z9114 Patient's other noncompliance with medication regimen: Secondary | ICD-10-CM | POA: Diagnosis not present

## 2017-10-07 DIAGNOSIS — F3113 Bipolar disorder, current episode manic without psychotic features, severe: Secondary | ICD-10-CM | POA: Diagnosis not present

## 2017-10-07 DIAGNOSIS — O99340 Other mental disorders complicating pregnancy, unspecified trimester: Secondary | ICD-10-CM | POA: Diagnosis not present

## 2017-10-07 DIAGNOSIS — G4700 Insomnia, unspecified: Secondary | ICD-10-CM | POA: Diagnosis not present

## 2017-10-07 DIAGNOSIS — O99341 Other mental disorders complicating pregnancy, first trimester: Secondary | ICD-10-CM | POA: Diagnosis not present

## 2017-10-07 LAB — LIPID PANEL
CHOL/HDL RATIO: 2.8 ratio
CHOLESTEROL: 182 mg/dL (ref 0–200)
HDL: 65 mg/dL (ref >40)
LDL Cholesterol: 108 mg/dL — ABNORMAL HIGH (ref 0–99)
Triglycerides: 46 mg/dL (ref <150)
VLDL: 9 mg/dL (ref 0–40)

## 2017-10-07 LAB — HIV ANTIBODY (ROUTINE TESTING W REFLEX): HIV Screen 4th Generation wRfx: NONREACTIVE

## 2017-10-07 LAB — TSH: TSH: 1.56 u[IU]/mL (ref 0.350–4.500)

## 2017-10-07 LAB — RPR: RPR Ser Ql: NONREACTIVE

## 2017-10-07 MED ORDER — PRENATAL MULTIVITAMIN CH
1.0000 | ORAL_TABLET | Freq: Every day | ORAL | Status: DC
  Filled 2017-10-07: qty 1

## 2017-10-07 MED ORDER — OLANZAPINE 10 MG IM SOLR: 5.0000 mg | Freq: Two times a day (BID) | INTRAMUSCULAR | Status: DC | PRN

## 2017-10-07 MED ORDER — OLANZAPINE 10 MG IM SOLR
5.0000 mg | Freq: Once | INTRAMUSCULAR | Status: AC
  Administered 2017-10-07: 5 mg via INTRAMUSCULAR
  Filled 2017-10-07: qty 10

## 2017-10-07 MED ORDER — OLANZAPINE 10 MG IM SOLR: 5.0000 mg | Freq: Once | INTRAMUSCULAR | Status: DC | PRN

## 2017-10-07 MED ORDER — OLANZAPINE 10 MG IM SOLR
10.0000 mg | Freq: Once | INTRAMUSCULAR | Status: AC
  Administered 2017-10-07: 10 mg via INTRAMUSCULAR
  Filled 2017-10-07: qty 10

## 2017-10-07 MED ORDER — OLANZAPINE 5 MG PO TABS: 5.0000 mg | ORAL_TABLET | Freq: Two times a day (BID) | ORAL | Status: DC | PRN

## 2017-10-07 NOTE — ED Notes (Signed)
Pt lying in bed, eyes open, speaking with pressured speech in a foreign language.

## 2017-10-07 NOTE — ED Notes (Signed)
Pt continues to have disorganized thought content, claps her hands for no apparent reasons and has unintelligible verbalizations. Gestures with hands expressively. Has not been aggressive or violent. Behavior has been staying in her room, or coming to the nurse station window, gesturing wildly, verbalizing something, and returning to her room.

## 2017-10-07 NOTE — ED Provider Notes (Signed)
Pt remains agitated but no acute medical issues.  Medically stable for transfer to a psychiatric facility.     Dorie Rank, MD 10/07/17 [phone]

## 2017-10-07 NOTE — ED Notes (Signed)
Bed: WBH40 Expected date:  Expected time:  Means of arrival:  Comments: 

## 2017-10-07 NOTE — ED Notes (Signed)
Pt is loud and cursing incessantly.  She refused to let us draw lab.  She continues to be very psychotic and manic.

## 2017-10-07 NOTE — Progress Notes (Signed)
Spoke with Margaretha Sheffield [phone] Zacarias Pontes. Reports she found this patient was assigned a bed at [facility]. Writer acknowledged this information and that previous shift had reported patient may come in the morning. Margaretha Sheffield states she did not realize this patient was to be transferred until just now (0115). Asked if we are aware that patient is 10 weeks and 5 days pregnant. No labs are included in chart and no mention of pregnancy noted. Informed TTS and will endorse information to am shift. Margaretha Sheffield states she leaves at 0700 but to contact facility for transport at that number.

## 2017-10-07 NOTE — Progress Notes (Signed)
CSW received a phone call from [facility] at ph: [phone] asking if pt still needs placement and CSW stated that per notes pt has been accepted elsewhere [facility]) at this time.  Please reconsult if future social work needs arise.  CSW signing off, as social work intervention is no longer needed.  Alphonse Guild. Maxxwell Edgett, LCSW, LCAS, CSI Clinical Social Worker Ph: [phone]

## 2017-10-07 NOTE — Consult Note (Addendum)
[facility] Consult   Reason for Consult:  Bizarre behavior Referring Physician:  EDP Patient Identification: [name] [name] MRN:  [id number] Principal Diagnosis: Schizoaffective disorder, bipolar type [facility]) Diagnosis:   Patient Active Problem List   Diagnosis Date Noted  . Adjustment disorder with mixed disturbance of emotions and conduct [F43.25] 07/11/2017  . Insomnia [G47.00]   . Anxiety state [F41.1]   . Overactive bladder [N32.81]   . Diabetes mellitus ([place]) [E11.9] 02/08/2015  . Schizoaffective disorder, bipolar type ([place]) [F25.0] 01/28/2015  . Non compliance w medication regimen [Z91.14]     Total Time spent with patient: 45 minutes  Subjective:   [name] [name] is a 37 y.o. female patient admitted with bizarre behavior.  HPI:  Pt was seen and chart reviewed with treatment team and Dr Mariea Clonts. Pt is exhibiting bizarre behavior with psychotic features. She is hyper-verbal and speaking with a foreign accent. She is making motions with her hands and arms that resemble sign language karate. She is [redacted] weeks pregnant. She was placed under IVC by [facility] for being in the parking lot and acting bizarre. She has a history of schizoaffective bipolar disorder. Pt's UDS and BAL are negative. Pt was recently hospitalized at [facility] from 5-4 to 05-15-2017 and discharge on Geodon, Vistaril and Trazodone. It unclear whether pt has been taking her medications since discharge. Pt has been medicated with IM Zyprexa today in an attempt to reduce her symptoms. She is currently wandering into other Pt's rooms in the TCU. Pt would benefit from an inpatient psychiatric hospitalization for crisis stabilization and medication management.   Past Psychiatric History: As above  Risk to Self: Suicidal Ideation: No Suicidal Intent: No Is patient at risk for suicide?: No Suicidal Plan?: No Access to Means: No What has been your use of drugs/alcohol within the last 12 months?: n How  many times?: (none reported) Other Self Harm Risks: (patient is acutely psychotic and her judgment impaired) Triggers for Past Attempts: None known Intentional Self Injurious Behavior: None Risk to Others: Homicidal Ideation: No Thoughts of Harm to Others: No Current Homicidal Intent: No Current Homicidal Plan: No Access to Homicidal Means: No Identified Victim: none History of harm to others?: No Assessment of Violence: None Noted Violent Behavior Description: none Does patient have access to weapons?: No Criminal Charges Pending?: No Does patient have a court date: No Prior Inpatient Therapy: Prior Inpatient Therapy: Yes Prior Therapy Dates: (05/2017) Prior Therapy Facilty/Provider(s): [facility] Reason for Treatment: Mania Prior Outpatient Therapy: Prior Outpatient Therapy: (unknown)  Past Medical History:  Past Medical History:  Diagnosis Date  . Bipolar affective disorder, currently manic, mild ([place])   . Diabetes mellitus without complication ([place])   . Schizophrenia [facility])     Past Surgical History:  Procedure Laterality Date  . WISDOM TOOTH EXTRACTION     Family History:  Family History  Problem Relation Age of Onset  . Drug abuse Maternal Uncle    Family Psychiatric  History: Unable to obtain, Pt is psychotic Social History:  Social History   Substance and Sexual Activity  Alcohol Use No     Social History   Substance and Sexual Activity  Drug Use No    Social History   Socioeconomic History  . Marital status: Legally Separated    Spouse name: Not on file  . Number of children: Not on file  . Years of education: Not on file  . Highest education level: Not on file  Occupational History  . Not on  file  Social Needs  . Financial resource strain: Not on file  . Food insecurity:    Worry: Not on file    Inability: Not on file  . Transportation needs:    Medical: Not on file    Non-medical: Not on file  Tobacco Use  . Smoking status: Never Smoker  .  Smokeless tobacco: Never Used  Substance and Sexual Activity  . Alcohol use: No  . Drug use: No  . Sexual activity: Not Currently  Lifestyle  . Physical activity:    Days per week: Not on file    Minutes per session: Not on file  . Stress: Not on file  Relationships  . Social connections:    Talks on phone: Not on file    Gets together: Not on file    Attends religious service: Not on file    Active member of club or organization: Not on file    Attends meetings of clubs or organizations: Not on file    Relationship status: Not on file  Other Topics Concern  . Not on file  Social History Narrative  . Not on file   Additional Social History:    Allergies:   Allergies  Allergen Reactions  . Quetiapine Anaphylaxis  . Diphenhydramine Hcl   . Lorazepam   . Risperidone Other (See Comments)    Pt reports "It makes me blind"   . Valproic Acid Other (See Comments)    Pt reports "it makes me too sleepy"   . Aripiprazole Other (See Comments)  . Fluphenazine Rash  . Haloperidol Other (See Comments)    Makes feel hot inside   . Trazodone Other (See Comments)  . Ziprasidone Hcl Palpitations    Labs:  Results for orders placed or performed during the hospital encounter of 10/06/17 (from the past 48 hour(s))  Rapid urine drug screen (hospital performed)     Status: None   Collection Time: 10/06/17 12:30 PM  Result Value Ref Range   Opiates NONE DETECTED NONE DETECTED   Cocaine NONE DETECTED NONE DETECTED   Benzodiazepines NONE DETECTED NONE DETECTED   Amphetamines NONE DETECTED NONE DETECTED   Tetrahydrocannabinol NONE DETECTED NONE DETECTED   Barbiturates NONE DETECTED NONE DETECTED    Comment: (NOTE) DRUG SCREEN FOR MEDICAL PURPOSES ONLY.  IF CONFIRMATION IS NEEDED FOR ANY PURPOSE, NOTIFY LAB WITHIN 5 DAYS. LOWEST DETECTABLE LIMITS FOR URINE DRUG SCREEN Drug Class                     Cutoff (ng/mL) Amphetamine and metabolites    1000 Barbiturate and metabolites     200 Benzodiazepine                 330 Tricyclics and metabolites     300 Opiates and metabolites        300 Cocaine and metabolites        300 THC                            50 Performed at [facility], [place] [address], [place], [place] [address]   Pregnancy, urine     Status: Abnormal   Collection Time: 10/06/17 12:37 PM  Result Value Ref Range   Preg Test, Ur POSITIVE (A) NEGATIVE    Comment:        THE SENSITIVITY OF THIS METHODOLOGY IS >20 mIU/mL. Performed at [facility], [place]  [address], [place], [place] [address]   CBC     Status: Abnormal   Collection Time: 10/06/17  5:06 PM  Result Value Ref Range   WBC 7.3 4.0 - 10.5 K/uL   RBC 3.51 (L) 3.87 - 5.11 MIL/uL   Hemoglobin 11.4 (L) 12.0 - 15.0 g/dL   HCT 32.5 (L) 36.0 - 46.0 %   MCV 92.6 78.0 - 100.0 fL   MCH 32.5 26.0 - 34.0 pg   MCHC 35.1 30.0 - 36.0 g/dL   RDW 13.2 11.5 - 15.5 %   Platelets 291 150 - 400 K/uL    Comment: Performed at [facility], [place] [address], [place], [place] [address]  Comprehensive metabolic panel     Status: Abnormal   Collection Time: 10/06/17  5:06 PM  Result Value Ref Range   Sodium 139 135 - 145 mmol/L   Potassium 3.5 3.5 - 5.1 mmol/L   Chloride 107 98 - 111 mmol/L   CO2 20 (L) 22 - 32 mmol/L   Glucose, Bld 74 70 - 99 mg/dL   BUN 25 (H) 6 - 20 mg/dL   Creatinine, Ser 0.88 0.44 - 1.00 mg/dL   Calcium 9.8 8.9 - 10.3 mg/dL   Total Protein 8.4 (H) 6.5 - 8.1 g/dL   Albumin 4.4 3.5 - 5.0 g/dL   AST 53 (H) 15 - 41 U/L   ALT 27 0 - 44 U/L   Alkaline Phosphatase 35 (L) 38 - 126 U/L   Total Bilirubin 1.3 (H) 0.3 - 1.2 mg/dL   GFR calc non Af Amer >60 >60 mL/min   GFR calc Af Amer >60 >60 mL/min    Comment: (NOTE) The eGFR has been calculated using the CKD EPI equation. This calculation has not been validated in all clinical situations. eGFR's persistently <60 mL/min signify possible Chronic Kidney Disease.    Anion gap 12 5 - 15     Comment: Performed at [facility], [place] [address], [place], [place] [address]  Ethanol     Status: None   Collection Time: 10/06/17  5:06 PM  Result Value Ref Range   Alcohol, Ethyl (B) <10 <10 mg/dL    Comment: (NOTE) Lowest detectable limit for serum alcohol is 10 mg/dL. For medical purposes only. Performed at [facility], [place] [address], [place], [place] [address]   Urinalysis, Routine w reflex microscopic     Status: Abnormal   Collection Time: 10/06/17  8:39 PM  Result Value Ref Range   Color, Urine YELLOW YELLOW   APPearance CLEAR CLEAR   Specific Gravity, Urine 1.029 1.005 - 1.030   pH 5.0 5.0 - 8.0   Glucose, UA NEGATIVE NEGATIVE mg/dL   Hgb urine dipstick NEGATIVE NEGATIVE   Bilirubin Urine NEGATIVE NEGATIVE   Ketones, ur 80 (A) NEGATIVE mg/dL   Protein, ur 30 (A) NEGATIVE mg/dL   Nitrite NEGATIVE NEGATIVE   Leukocytes, UA NEGATIVE NEGATIVE   RBC / HPF 0-5 0 - 5 RBC/hpf   WBC, UA 0-5 0 - 5 WBC/hpf   Bacteria, UA RARE (A) NONE SEEN   Squamous Epithelial / LPF 0-5 0 - 5   Mucus PRESENT    Hyaline Casts, UA PRESENT     Comment: Performed at [facility], [place] [address], [place],  [address]    Current Facility-Administered Medications  Medication Dose Route Frequency Provider Last Rate Last Dose  . benztropine (COGENTIN) tablet 0.5 mg  0.5 mg Oral BID PRN  Nanci Pina, FNP       Or  . benztropine mesylate (COGENTIN) injection 0.5 mg  0.5 mg Intramuscular BID PRN Nanci Pina, FNP      . OLANZapine zydis (ZYPREXA) disintegrating tablet 5 mg  5 mg Oral BID PRN Nanci Pina, FNP   5 mg at 10/07/17 0229  . prenatal multivitamin tablet 1 tablet  1 tablet Oral Q1200 Quintella Reichert, MD       Current Outpatient Medications  Medication Sig Dispense Refill  . diphenhydrAMINE (BENADRYL) 25 mg capsule Take 100 mg by mouth every 6 (six) hours as needed for sleep.    . Prenatal Vit-Fe Fumarate-FA  (MULTIVITAMIN-PRENATAL) 27-0.8 MG TABS tablet Take 1 tablet by mouth daily at 12 noon.    . fluticasone (FLONASE) 50 MCG/ACT nasal spray Place 2 sprays into both nostrils daily. For allergies (Patient not taking: Reported on 10/07/2017) 16 g 0  . hydrOXYzine (ATARAX/VISTARIL) 50 MG tablet Take 1 tablet (50 mg total) by mouth every 6 (six) hours as needed for anxiety (sleep). (Patient not taking: Reported on 10/07/2017) 90 tablet 0  . ziprasidone (GEODON) 20 MG capsule Take 1 capsule (20 mg total) by mouth 2 (two) times daily with a meal. For mood control (Patient not taking: Reported on 10/07/2017) 60 capsule 0    Musculoskeletal: Strength & Muscle Tone: within normal limits Gait & Station: normal Patient leans: N/A  Psychiatric Specialty Exam: Physical Exam  Nursing note and vitals reviewed. Constitutional: She appears well-developed and well-nourished.  HENT:  Head: Normocephalic and atraumatic.  Neck: Normal range of motion.  Respiratory: Effort normal.  Musculoskeletal: Normal range of motion.  Neurological: She is alert.  Psychiatric: Her mood appears anxious. Her affect is labile. Her speech is tangential. She is agitated. Thought content is delusional. Cognition and memory are impaired. She expresses impulsivity. She exhibits a depressed mood.    Review of Systems  Unable to perform ROS: Mental status change    Blood pressure 133/76, pulse (!) 113, temperature 98 F (36.7 C), temperature source Axillary, resp. rate 16, SpO2 98 %.There is no height or weight on file to calculate BMI.  General Appearance: Bizarre  Eye Contact:  Fair  Speech:  Pressured  Volume:  Increased  Mood:  Angry and Irritable  Affect:  Congruent and Labile  Thought Process:  Disorganized and Descriptions of Associations: Loose  Orientation:  Other:  UTA pt is psychotic  Thought Content:  Illogical and Tangential  Suicidal Thoughts:  UTA Pt is psychotic  Homicidal Thoughts:  UTA Pt is psychotic  Memory:   Immediate;   UTA Pt is psychotic Recent;   UTA Pt is psychotic Remote;   UTA Pt is psychotic  Judgement:  Other:  UTA Pt is psychotic  Insight:  UTA Pt is psychotic  Psychomotor Activity:  Increased and Restlessness  Concentration:  Concentration: Poor and Attention Span: Poor  Recall:  UTA Pt is psychotic  Fund of Knowledge:  UTA Pt is psychotic  Language:  Fair  Akathisia:  No  Handed:  Right  AIMS (if indicated):   N/A  Assets:  Physical Health  ADL's:  Intact  Cognition:  Impaired,  Severe  Sleep:   N/A     Treatment Plan Summary: Daily contact with patient to assess and evaluate symptoms and progress in treatment and Medication management  Disposition: Recommend psychiatric Inpatient admission when medically cleared. TTS to seek placement -Continue Zyprexa PRN for psychosis/agitation since relatively safe in pregnancy.  Ethelene Hal, NP 10/07/2017 1:22 PM   Patient seen face-to-face for psychiatric evaluation, chart reviewed and case discussed with the physician extender and developed treatment plan. Reviewed the information documented and agree with the treatment plan.  Buford Dresser, DO 10/07/17 1:51 PM

## 2017-10-07 NOTE — BH Assessment (Signed)
Per instructions of [facility], Probation officer called patient's nurse Olegario Shearer) to inform her we are unable to tranfser patient to the unit at this time due to the patient's and units acuityy. Concerns for patient's safety.  Writer also informed [facility] Ria Comment) but she was already aware and have instructed [facility] Disposition TTS to refer the patient out.

## 2017-10-07 NOTE — ED Notes (Signed)
Pt speaking with a [place] accent, then changed to speaking softly.

## 2017-10-07 NOTE — BH Assessment (Signed)
[facility] Assessment Progress Note  Per Buford Dresser, DO, this pt requires psychiatric hospitalization at this time.  Pt presents under IVC initiated by pt's pastor, which Dr Mariea Clonts has upheld.  At 15:42 Santiago Glad calls from [facility] to report that pt has been accepted to their facility by Dr Bjorn Loser.  EDP Dorie Rank, MD concurs with this decision.  Pt's nurse, Diane, has been notified, and agrees to call report to [phone].  Pt is to be transported via [facility].  [place] Coordinator [phone]

## 2017-10-07 NOTE — ED Notes (Signed)
Patient transported to [facility] by [facility], for continuation of specialized care. Belongings signed for and given to Deputy. Patient left in no acute distress.

## 2017-10-07 NOTE — ED Notes (Signed)
This patient remains very psychotic.  She wants to see her doctor but will not tell us who that is.  She continues to use hand gestures and threatening gestures although patient willingly let me give her injections.  She is very bizarre.

## 2017-10-07 NOTE — ED Notes (Signed)
Dialed [phone], [facility], spoke with Ubaldo Glassing.  Informed a note in the chart reflects pt has a bed assignment.  Per Ubaldo Glassing, was informed pt would probably be coming tomorrow.  Informed Alex, pt is 10 weeks and 5 days pregnant.  Alex stated the report she had received did not even have lab results.  Alex to pass information along to day shift.

## 2017-10-07 NOTE — ED Notes (Signed)
Spoke with Dr. Dina Rich r/t administering zyprexa d/t pt pregnancy.  Per Dr. Dina Rich, is okay if pt will take.

## 2017-10-07 NOTE — ED Provider Notes (Signed)
Pt alert ambulates without difficulty. Stable for transfer to psychiatric facility. Dr Bjorn Loser accepting pysician   [name] Dakin, MD 10/07/17 Drema Halon

## 2017-10-07 NOTE — ED Notes (Signed)
Pt has become loud and agitated and animated.  She is using hand gestures and yelling.  She is difficult to understand.  She took her medication with a show of force and a lot of verbal deescalating.  Sitter is at bedside and patient will be monitored closely for safety.

## 2017-10-08 LAB — HEMOGLOBIN A1C
HEMOGLOBIN A1C: 5 % (ref 4.8–5.6)
Mean Plasma Glucose: 97 mg/dL

## 2017-11-19 ENCOUNTER — Emergency Department (HOSPITAL_COMMUNITY)
Admission: EM | Admit: 2017-11-19 | Discharge: 2017-11-29 | Disposition: A | Discharge status: Home / Self Care | Payer: Medicare HMO | Attending: Emergency Medicine | Admitting: Emergency Medicine

## 2017-11-19 ENCOUNTER — Encounter (HOSPITAL_COMMUNITY): Payer: Self-pay

## 2017-11-19 DIAGNOSIS — E119 Type 2 diabetes mellitus without complications: Secondary | ICD-10-CM | POA: Insufficient documentation

## 2017-11-19 DIAGNOSIS — Z3A17 17 weeks gestation of pregnancy: Secondary | ICD-10-CM | POA: Diagnosis not present

## 2017-11-19 DIAGNOSIS — Z79899 Other long term (current) drug therapy: Secondary | ICD-10-CM | POA: Diagnosis not present

## 2017-11-19 DIAGNOSIS — R451 Restlessness and agitation: Secondary | ICD-10-CM | POA: Diagnosis present

## 2017-11-19 DIAGNOSIS — F312 Bipolar disorder, current episode manic severe with psychotic features: Secondary | ICD-10-CM | POA: Insufficient documentation

## 2017-11-19 DIAGNOSIS — F250 Schizoaffective disorder, bipolar type: Secondary | ICD-10-CM | POA: Diagnosis not present

## 2017-11-19 DIAGNOSIS — O9989 Other specified diseases and conditions complicating pregnancy, childbirth and the puerperium: Secondary | ICD-10-CM | POA: Diagnosis not present

## 2017-11-19 DIAGNOSIS — F531 Puerperal psychosis: Secondary | ICD-10-CM | POA: Diagnosis not present

## 2017-11-19 DIAGNOSIS — F23 Brief psychotic disorder: Secondary | ICD-10-CM

## 2017-11-19 DIAGNOSIS — O0001 Abdominal pregnancy with intrauterine pregnancy: Secondary | ICD-10-CM | POA: Diagnosis not present

## 2017-11-19 DIAGNOSIS — Z331 Pregnant state, incidental: Secondary | ICD-10-CM | POA: Insufficient documentation

## 2017-11-19 DIAGNOSIS — Z3A16 16 weeks gestation of pregnancy: Secondary | ICD-10-CM | POA: Diagnosis not present

## 2017-11-19 LAB — CBC WITH DIFFERENTIAL/PLATELET
ABS IMMATURE GRANULOCYTES: 0.02 10*3/uL (ref 0.00–0.07)
Basophils Absolute: 0 10*3/uL (ref 0.0–0.1)
Basophils Relative: 1 %
Eosinophils Absolute: 0 10*3/uL (ref 0.0–0.5)
Eosinophils Relative: 0 %
HCT: 29 % — ABNORMAL LOW (ref 36.0–46.0)
HEMOGLOBIN: 9.8 g/dL — AB (ref 12.0–15.0)
Immature Granulocytes: 0 %
LYMPHS PCT: 17 %
Lymphs Abs: 1 10*3/uL (ref 0.7–4.0)
MCH: 32.9 pg (ref 26.0–34.0)
MCHC: 33.8 g/dL (ref 30.0–36.0)
MCV: 97.3 fL (ref 80.0–100.0)
MONO ABS: 0.5 10*3/uL (ref 0.1–1.0)
MONOS PCT: 8 %
NEUTROS ABS: 4.5 10*3/uL (ref 1.7–7.7)
Neutrophils Relative %: 74 %
Platelets: 190 10*3/uL (ref 150–400)
RBC: 2.98 MIL/uL — AB (ref 3.87–5.11)
RDW: 12.4 % (ref 11.5–15.5)
WBC: 6 10*3/uL (ref 4.0–10.5)
nRBC: 0 % (ref 0.0–0.2)

## 2017-11-19 LAB — COMPREHENSIVE METABOLIC PANEL
ALBUMIN: 3.9 g/dL (ref 3.5–5.0)
ALK PHOS: 30 U/L — AB (ref 38–126)
ALT: 23 U/L (ref 0–44)
ANION GAP: 12 (ref 5–15)
AST: 41 U/L (ref 15–41)
BUN: 14 mg/dL (ref 6–20)
CALCIUM: 9.1 mg/dL (ref 8.9–10.3)
CO2: 21 mmol/L — ABNORMAL LOW (ref 22–32)
Chloride: 106 mmol/L (ref 98–111)
Creatinine, Ser: 0.76 mg/dL (ref 0.44–1.00)
GFR calc Af Amer: >60 mL/min (ref >60)
GFR calc non Af Amer: >60 mL/min (ref >60)
GLUCOSE: 58 mg/dL — AB (ref 70–99)
Potassium: 3.1 mmol/L — ABNORMAL LOW (ref 3.5–5.1)
SODIUM: 139 mmol/L (ref 135–145)
Total Bilirubin: 1.1 mg/dL (ref 0.3–1.2)
Total Protein: 7.4 g/dL (ref 6.5–8.1)

## 2017-11-19 LAB — I-STAT BETA HCG BLOOD, ED (MC, WL, AP ONLY): I-stat hCG, quantitative: >2000 m[IU]/mL — ABNORMAL HIGH (ref <5)

## 2017-11-19 LAB — ACETAMINOPHEN LEVEL: Acetaminophen (Tylenol), Serum: <10 ug/mL — ABNORMAL LOW (ref 10–30)

## 2017-11-19 LAB — ETHANOL: Alcohol, Ethyl (B): <10 mg/dL (ref <10)

## 2017-11-19 LAB — SALICYLATE LEVEL: Salicylate Lvl: <7 mg/dL (ref 2.8–30.0)

## 2017-11-19 MED ORDER — ZIPRASIDONE MESYLATE 20 MG IM SOLR
20.0000 mg | Freq: Once | INTRAMUSCULAR | Status: AC
  Administered 2017-11-19: 20 mg via INTRAMUSCULAR
  Filled 2017-11-19: qty 20

## 2017-11-19 MED ORDER — PRENATAL PLUS 27-1 MG PO TABS
1.0000 | ORAL_TABLET | Freq: Every day | ORAL | Status: DC
  Administered 2017-11-19 – 2017-11-29 (×10): 1 via ORAL
  Filled 2017-11-19 (×13): qty 1

## 2017-11-19 MED ORDER — ZIPRASIDONE HCL 20 MG PO CAPS
20.0000 mg | ORAL_CAPSULE | Freq: Two times a day (BID) | ORAL | Status: DC
  Administered 2017-11-20 – 2017-11-21 (×2): 20 mg via ORAL
  Filled 2017-11-19 (×3): qty 1

## 2017-11-19 MED ORDER — ZIPRASIDONE MESYLATE 20 MG IM SOLR
20.0000 mg | Freq: Two times a day (BID) | INTRAMUSCULAR | Status: DC | PRN
  Administered 2017-11-20 – 2017-11-21 (×2): 20 mg via INTRAMUSCULAR
  Filled 2017-11-19 (×3): qty 20

## 2017-11-19 MED ORDER — ZIPRASIDONE MESYLATE 20 MG IM SOLR: 20.0000 mg | Freq: Once | INTRAMUSCULAR | Status: DC

## 2017-11-19 NOTE — BH Assessment (Signed)
[facility] Assessment Progress Note       Attempted to see patient, unable to arouse her to complete assessment

## 2017-11-19 NOTE — ED Notes (Signed)
Bed: WBH40 Expected date:  Expected time:  Means of arrival:  Comments: Hold for 29 

## 2017-11-19 NOTE — BH Assessment (Signed)
[facility] Assessment Progress Note   Per Leilani Merl, DO and Jinny Blossom, NP, patient will be observed and monitored overnight for safety and re-started on her medications and most likely will be discharged tomorrow once she is more stable.

## 2017-11-19 NOTE — BH Assessment (Signed)
[facility] Assessment Progress Note  Per Buford Dresser, DO, this pt requires psychiatric hospitalization at this time.  Pt presents under IVC initiated by [place].  The following facilities have been contacted to seek placement for this pt, with results as noted:  At capacity:  [name] Coordinator [phone]

## 2017-11-19 NOTE — ED Notes (Signed)
Pt. Refused bed pan. RN,Holly made aware.

## 2017-11-19 NOTE — ED Notes (Signed)
On admission to the Acute Unit pt is responding to internal stimuli, unsure if it is visual, auditory, or both. Stands in front of the nurses station gesturing and it appears to be meaningless. When spoken to, she whispers rapidly and is not understandable. Pt given a room, but she would not go to her room at this time. If pt selects an empty room on her on this will become her room. Comprehension appears to be poor.

## 2017-11-19 NOTE — ED Notes (Signed)
Bed: [facility] Expected date:  Expected time:  Means of arrival:  Comments:

## 2017-11-19 NOTE — BH Assessment (Addendum)
Assessment Note  Per EDP Report: [name] [name] is a 37 y.o. female with a history of schizophrenia.  She was brought by police after being found wandering the streets.  She was reportedly trying to enter buildings because she falsely thought she lived there.  She has had a labile mood varying between periods of cooperation and periods of agitation and combativeness.  On arrival she is yelling nonsense and thrashing about.  She was noted to be wearing multiple layers of clothing including multiple layers of underwear.  She is reportedly about [redacted] weeks pregnant.  TTS Assessment:  Patient was seen recently by this writer with a similar manic presentation and psychosis and she was very sexually focused.  As a result of her presentation, she had to be psychiatrically hospitalized at [facility].  Patient is again presenting with manic behavior, very animated and with bizarre behavior.  Most likely, patient is again off of her medication. TTS and Dr, Mariea Clonts saw this patient and she is denying SI/HI, but patient does have some baseline psychotic behavior and mania.  Patient does not express any thought of hurting herself or others and she has no reported drug use.  Patient's presentation on this occasion is much better than her presentation during her last ED admission.  Despite her unusual presentation, it is most likely her baseline.  However, Dr. Mariea Clonts feels like patient needs to be back on her medications before being discharged from the ED because she has most likely not been seeing an outpatient provider, or if so, TTS is unable to identify who the provider is that she is assigned to. Patient is requesting to be prescribed Geodon because it works better for her.  Patient is alert and oriented, but very animated.  Patient has been cooperative for the most part this morning, but last pm she was very agitated and had to be chemically restrained.  Patient's insight, judgement and impulse control were  impaired.  She did not seem to be responding to any internal stimuli.  Her thoughts were somewhat disorganized and patient was rambling.  Her speech was pressured, but her eye contact was good.  Diagnosis: Bipolar Disorder Manic F31.2  Past Medical History:  Past Medical History:  Diagnosis Date  . Bipolar affective disorder, currently manic, mild ([place])   . Diabetes mellitus without complication ([place])   . Schizophrenia [facility])     Past Surgical History:  Procedure Laterality Date  . WISDOM TOOTH EXTRACTION      Family History:  Family History  Problem Relation Age of Onset  . Drug abuse Maternal Uncle     Social History:  reports that she has never smoked. She has never used smokeless tobacco. She reports that she does not drink alcohol or use drugs.  Additional Social History:  Alcohol / Drug Use Pain Medications: see MAR Prescriptions: see MAR Over the Counter: see MAR History of alcohol / drug use?: No history of alcohol / drug abuse  CIWA: CIWA-Ar BP: 114/80 Pulse Rate: (!) 107 COWS:    Allergies:  Allergies  Allergen Reactions  . Quetiapine Anaphylaxis    "Pass out"  . Diphenhydramine Hcl   . Gabapentin Other (See Comments)    "Feet on fire"  . Lorazepam   . Risperidone Other (See Comments)    Pt reports "It makes me blind"   . Valproic Acid Other (See Comments)    Pt reports "it makes me too sleepy"   . Aripiprazole Other (See Comments)  Makes patient "too tired"  . Fluphenazine Rash  . Haloperidol Other (See Comments)    Makes feel hot inside. "Tires my tongue"   . Trazodone Other (See Comments)  . Ziprasidone Hcl Palpitations    Home Medications:  (Not in a hospital admission)  OB/GYN Status:  No LMP recorded. Patient is pregnant.  General Assessment Data Location of Assessment: [name] Assessment: In system Is this a Tele or Face-to-Face Assessment?: Face-to-Face Is this an Initial Assessment or a Re-assessment for this encounter?:  Initial Assessment Patient Accompanied by:: N/A Language Other than English: No Living Arrangements: Other (Comment)(was living with boyfriend recently) What gender do you identify as?: Female Marital status: Single Maiden name: Lovena Le) Pregnancy Status: Yes (Comment: include estimated delivery date)(16 weeks) Living Arrangements: Spouse/significant other Can pt return to current living arrangement?: Yes Admission Status: Involuntary Petitioner: ED Attending Is patient capable of signing voluntary admission?: No Referral Source: Self/Family/Friend Insurance type: Personnel officer Medicare)     Crisis Care Plan Living Arrangements: Spouse/significant other Legal Guardian: Other:(self) Name of Psychiatrist: ([facility]) Name of Therapist: [facility]  Education Status Is patient currently in school?: No Is the patient employed, unemployed or receiving disability?: Receiving disability income  Risk to self with the past 6 months Suicidal Ideation: No Has patient been a risk to self within the past 6 months prior to admission? : No Suicidal Intent: No Has patient had any suicidal intent within the past 6 months prior to admission? : No Is patient at risk for suicide?: No Suicidal Plan?: No Has patient had any suicidal plan within the past 6 months prior to admission? : No Access to Means: No What has been your use of drugs/alcohol within the last 12 months?: (none reported) Previous Attempts/Gestures: (UTA) How many times?: (none reported) Other Self Harm Risks: (non-compliant wit medications) Triggers for Past Attempts: None known Intentional Self Injurious Behavior: None Family Suicide History: No Persecutory voices/beliefs?: No Depression: Yes Depression Symptoms: Despondent, Insomnia, Loss of interest in usual pleasures, Feeling worthless/self pity Substance abuse history and/or treatment for substance abuse?: No Suicide prevention information given to non-admitted patients: Not  applicable  Risk to Others within the past 6 months Homicidal Ideation: No Does patient have any lifetime risk of violence toward others beyond the six months prior to admission? : No Thoughts of Harm to Others: No Current Homicidal Intent: No Current Homicidal Plan: No Access to Homicidal Means: No Identified Victim: none History of harm to others?: No Assessment of Violence: None Noted Violent Behavior Description: none Does patient have access to weapons?: No Criminal Charges Pending?: No Does patient have a court date: No Is patient on probation?: No  Psychosis Hallucinations: Auditory, Visual Delusions: None noted  Mental Status Report Appearance/Hygiene: Unremarkable Eye Contact: Good Motor Activity: Restlessness, Mannerisms Speech: Pressured Level of Consciousness: Alert Mood: Depressed Affect: Blunted Anxiety Level: Moderate Thought Processes: Coherent, Relevant Judgement: Impaired Orientation: Person, Place, Time, Situation Obsessive Compulsive Thoughts/Behaviors: None  Cognitive Functioning Concentration: Decreased Memory: Recent Intact, Remote Intact Is patient IDD: No Insight: Poor Impulse Control: Poor Appetite: Poor Have you had any weight changes? : No Change Sleep: Decreased Total Hours of Sleep: (UTA) Vegetative Symptoms: None  ADLScreening [facility] Assessment Services) Patient's cognitive ability adequate to safely complete daily activities?: Yes Patient able to express need for assistance with ADLs?: Yes Independently performs ADLs?: Yes (appropriate for developmental age)  Prior Inpatient Therapy Prior Inpatient Therapy: Yes Prior Therapy Dates: (5/19-[facility] and 10/19 at [facility]) Prior Therapy Facilty/Provider(s): [facility] [place], [place]  [place]) Reason for Treatment: (Mania)  Prior Outpatient Therapy Prior Outpatient Therapy: (unknown [place] Provider)  ADL Screening (condition at time of admission) Patient's cognitive ability adequate to safely  complete daily activities?: Yes Is the patient deaf or have difficulty hearing?: No Does the patient have difficulty seeing, even when wearing glasses/contacts?: No Does the patient have difficulty concentrating, remembering, or making decisions?: No Patient able to express need for assistance with ADLs?: Yes Does the patient have difficulty dressing or bathing?: No Independently performs ADLs?: Yes (appropriate for developmental age) Does the patient have difficulty walking or climbing stairs?: No Weakness of Legs: None Weakness of Arms/Hands: None  Home Assistive Devices/Equipment Home Assistive Devices/Equipment: None  Therapy Consults (therapy consults require a physician order) PT Evaluation Needed: No OT Evalulation Needed: No SLP Evaluation Needed: No Abuse/Neglect Assessment (Assessment to be complete while patient is alone) Abuse/Neglect Assessment Can Be Completed: Unable to assess, patient is non-responsive or altered mental status     Advance Directives (For Healthcare) Does Patient Have a Medical Advance Directive?: No Would patient like information on creating a medical advance directive?: No - Patient declined Nutrition Screen- [facility] Patient's home diet: NPO Has the patient recently lost weight without trying?: No Has the patient been eating poorly because of a decreased appetite?: No Malnutrition Screening Tool Score: 0        Disposition: Per Leilani Merl, DO and Jinny Blossom, NP, patient will be observed and monitored overnight for safety and re-started on her medications and most likely will be discharged tomorrow once she is more stable. Disposition Initial Assessment Completed for this Encounter: Yes Disposition of Patient: (overnight OBS)  On Site Evaluation by:   Reviewed with Physician:    Judeth Porch Kenta Laster 11/19/2017 12:10 PM

## 2017-11-19 NOTE — ED Notes (Signed)
Pt arrived calmly to room 29.  Restraints were removed.  Pt appears to be responding to internal stimuli.  Pt encouraged to provide urine sample.

## 2017-11-19 NOTE — ED Notes (Signed)
Bizarre behavior continues as pt has taken up residence in the bathroom talking to the toilet paper and paper towels, arranging them specifically. Appears ritualized. When redirected she whispered, "No No Please Please". Behavior has not been a problem for the milieu.

## 2017-11-19 NOTE — ED Triage Notes (Signed)
Pt arrived via [employer] for a psychiatric evaluation, pt arrived yelling in hallway, refusing to cooperate. Pt in excessive amount of clothing.

## 2017-11-19 NOTE — ED Provider Notes (Addendum)
[place] DEPT Provider Note: [name], MD, FACEP  CSN: [id number] MRN: [id number] ARRIVAL: 11/19/17 at [place]: [place]  Psychiatric Evaluation  Level 5 caveat: Psychotic HISTORY OF PRESENT ILLNESS  11/19/17 6:18 AM [name] [name] is a 37 y.o. female with a history of schizophrenia.  She was brought by police after being found wandering the streets.  She was reportedly trying to enter buildings because she falsely thought she lived there.  She has had a labile mood varying between periods of cooperation and periods of agitation and combativeness.  On arrival she is yelling nonsense and thrashing about.  She was noted to be wearing multiple layers of clothing including multiple layers of underwear.  She is reportedly about [redacted] weeks pregnant.   Past Medical History:  Diagnosis Date  . Bipolar affective disorder, currently manic, mild ([place])   . Diabetes mellitus without complication ([place])   . Schizophrenia [facility])     Past Surgical History:  Procedure Laterality Date  . WISDOM TOOTH EXTRACTION      Family History  Problem Relation Age of Onset  . Drug abuse Maternal Uncle     Social History   Tobacco Use  . Smoking status: Never Smoker  . Smokeless tobacco: Never Used  Substance Use Topics  . Alcohol use: No  . Drug use: No    Prior to Admission medications   Not on File    Allergies Quetiapine; Diphenhydramine hcl; Gabapentin; Lorazepam; Risperidone; Valproic acid; Aripiprazole; Fluphenazine; Haloperidol; Trazodone; and Ziprasidone hcl   REVIEW OF SYSTEMS     PHYSICAL EXAMINATION  Initial Vital Signs Blood pressure (!) 121/105, temperature 98 F (36.7 C), temperature source Oral, resp. rate 20.  Examination General: Well-developed, well-nourished female in obvious emotional distress; appearance consistent with age of record HENT: normocephalic; atraumatic Eyes: pupils equal, round and reactive to light;  extraocular muscles intact Neck: supple Heart: regular rate and rhythm Lungs: clear to auscultation bilaterally Abdomen: soft; gravid; nontender; bowel sounds present Extremities: No deformity; full range of motion; pulses normal Neurologic: Awake, alert; motor function intact in all extremities and symmetric; no facial droop Skin: Warm and dry Psychiatric: Agitated; incomprehensible speech   RESULTS  Summary of this visit's results, reviewed by myself:   EKG Interpretation  Date/Time:    Ventricular Rate:    PR Interval:    QRS Duration:   QT Interval:    QTC Calculation:   R Axis:     Text Interpretation:        Laboratory Studies: Results for orders placed or performed during the hospital encounter of 11/19/17 (from the past 24 hour(s))  Ethanol     Status: None   Collection Time: 11/19/17  6:08 AM  Result Value Ref Range   Alcohol, Ethyl (B) <64 <33 mg/dL  Salicylate level     Status: None   Collection Time: 11/19/17  6:08 AM  Result Value Ref Range   Salicylate Lvl <2.9 2.8 - 30.0 mg/dL  Acetaminophen level     Status: Abnormal   Collection Time: 11/19/17  6:08 AM  Result Value Ref Range   Acetaminophen (Tylenol), Serum <10 (L) 10 - 30 ug/mL  CBC with Differential/Platelet     Status: Abnormal   Collection Time: 11/19/17  6:08 AM  Result Value Ref Range   WBC 6.0 4.0 - 10.5 K/uL   RBC 2.98 (L) 3.87 - 5.11 MIL/uL   Hemoglobin 9.8 (L) 12.0 - 15.0 g/dL   HCT 29.0 (L)  36.0 - 46.0 %   MCV 97.3 80.0 - 100.0 fL   MCH 32.9 26.0 - 34.0 pg   MCHC 33.8 30.0 - 36.0 g/dL   RDW 12.4 11.5 - 15.5 %   Platelets 190 150 - 400 K/uL   nRBC 0.0 0.0 - 0.2 %   Neutrophils Relative % 74 %   Neutro Abs 4.5 1.7 - 7.7 K/uL   Lymphocytes Relative 17 %   Lymphs Abs 1.0 0.7 - 4.0 K/uL   Monocytes Relative 8 %   Monocytes Absolute 0.5 0.1 - 1.0 K/uL   Eosinophils Relative 0 %   Eosinophils Absolute 0.0 0.0 - 0.5 K/uL   Basophils Relative 1 %   Basophils Absolute 0.0 0.0 - 0.1  K/uL   Immature Granulocytes 0 %   Abs Immature Granulocytes 0.02 0.00 - 0.07 K/uL  Comprehensive metabolic panel     Status: Abnormal   Collection Time: 11/19/17  6:38 AM  Result Value Ref Range   Sodium 139 135 - 145 mmol/L   Potassium 3.1 (L) 3.5 - 5.1 mmol/L   Chloride 106 98 - 111 mmol/L   CO2 21 (L) 22 - 32 mmol/L   Glucose, Bld 58 (L) 70 - 99 mg/dL   BUN 14 6 - 20 mg/dL   Creatinine, Ser 0.76 0.44 - 1.00 mg/dL   Calcium 9.1 8.9 - 10.3 mg/dL   Total Protein 7.4 6.5 - 8.1 g/dL   Albumin 3.9 3.5 - 5.0 g/dL   AST 41 15 - 41 U/L   ALT 23 0 - 44 U/L   Alkaline Phosphatase 30 (L) 38 - 126 U/L   Total Bilirubin 1.1 0.3 - 1.2 mg/dL   GFR calc non Af Amer >60 >60 mL/min   GFR calc Af Amer >60 >60 mL/min   Anion gap 12 5 - 15  I-Stat Beta hCG blood, ED ([facility], [facility], [facility] only)     Status: Abnormal   Collection Time: 11/19/17  6:48 AM  Result Value Ref Range   I-stat hCG, quantitative >2,000.0 (H) <5 mIU/mL   Comment 3           Imaging Studies: No results found.  ED COURSE and MDM  Nursing notes and initial vitals signs, including pulse oximetry, reviewed.  Vitals:   11/19/17 0618 11/19/17 0643 11/19/17 0752 11/19/17 1123  BP: (!) 121/105 116/71 108/74 114/80  Pulse:  97 86 (!) 107  Resp: 20 18 16  (!) 22  Temp: 98 F (36.7 C)     TempSrc: Oral     SpO2:  100% 100% 100%   6:28 AM Patient given Geodon IM and placed in soft restraints pending Geodon becoming therapeutic.  PROCEDURES    ED DIAGNOSES     ICD-10-CM   1. Acute psychosis ([place]) F23        Shanon Rosser, MD 11/19/17 2240    Shanon Rosser, MD 11/19/17 2240

## 2017-11-20 DIAGNOSIS — F250 Schizoaffective disorder, bipolar type: Secondary | ICD-10-CM

## 2017-11-20 DIAGNOSIS — F312 Bipolar disorder, current episode manic severe with psychotic features: Secondary | ICD-10-CM | POA: Diagnosis not present

## 2017-11-20 MED ORDER — STERILE WATER FOR INJECTION IJ SOLN
INTRAMUSCULAR | Status: AC
  Administered 2017-11-20: 10 mL
  Filled 2017-11-20: qty 10

## 2017-11-20 MED ORDER — DIPHENHYDRAMINE HCL 25 MG PO CAPS
25.0000 mg | ORAL_CAPSULE | Freq: Every evening | ORAL | Status: DC | PRN
  Administered 2017-11-21 – 2017-11-28 (×9): 25 mg via ORAL
  Filled 2017-11-20 (×10): qty 1

## 2017-11-20 NOTE — ED Notes (Signed)
Patient is alert and oriented to person and place.  Appears preoccupied and responding to internal stimuli.  Observed having a full conversation with herself.  Patient is disorganized with pressured speech and tangential thought process.  Presents with restless affect and paranoid.  Denies suicidal thought, auditory and visual hallucinations.  Medication given as prescribed.  Routine safety checks maintained every 15 minutes and via security camera.  Patient is safe on the unit.  Support and encouragement offered as needed.

## 2017-11-20 NOTE — ED Notes (Signed)
Patient in bed but still not sleeping.  Has been getting up and coming to the nurse's station trying to talk but unable to communicate due to disorganized thoughts.  Patient redirected to room and bed due to lack of sleep.

## 2017-11-20 NOTE — Consult Note (Addendum)
[place] Psychiatry Consult   Reason for Consult:  Psychotic behavior Referring Physician:  EDP Patient Identification: [name] [name] MRN:  [id number] Principal Diagnosis: Schizoaffective disorder, bipolar type [facility]) Diagnosis:   Patient Active Problem List   Diagnosis Date Noted  . Adjustment disorder with mixed disturbance of emotions and conduct [F43.25] 07/11/2017  . Insomnia [G47.00]   . Anxiety state [F41.1]   . Overactive bladder [N32.81]   . Diabetes mellitus ([place]) [E11.9] 02/08/2015  . Schizoaffective disorder, bipolar type ([place]) [F25.0] 01/28/2015  . Non compliance w medication regimen [Z91.14]     Total Time spent with patient: 30 minutes  Subjective:   [name] [name] is a 37 y.o. female patient admitted with acute psychotic behavior.  HPI: Pt was seen and chart reviewed with treatment team and Dr Mariea Clonts. Pt denies suicidal/homicidal ideation, denies auditory/visual hallucinations and does not appear to be responding to internal stimuli. Pt is acutely psychotic at this time. Pt is responding to internal stimuli, having full conversations with people who are not present and not sleeping. Pt is able to answer some questions however the information provided is tangential and unreliable. Pt is also ~[redacted] weeks pregnant, per [place] completed on 10-06-2017 pt was 10 wks, 5 days pregnant. She appears to be under no distress with her pregnancy.  Pt will take her prenatal vitamin. Pt was recently hospitalized for the same presentation at [facility]. Pt has been taking her Geodon 20 mg PO while here in the [place]. She stated she was on Zyprexa but it "doesn't work" and she refused to take it. Medications choices are limited due to her pregnancy. Pt's BAL is negative and Pt consistently refuses to give a urine sample. Pt is currently in need of an inpatient psychiatric admission for crisis stabilization and medication management.    Past Psychiatric History: As  above  Risk to Self: Suicidal Ideation: No Suicidal Intent: No Is patient at risk for suicide?: No Suicidal Plan?: No Access to Means: No What has been your use of drugs/alcohol within the last 12 months?: (none reported) How many times?: (none reported) Other Self Harm Risks: (non-compliant wit medications) Triggers for Past Attempts: None known Intentional Self Injurious Behavior: None Risk to Others: Homicidal Ideation: No Thoughts of Harm to Others: No Current Homicidal Intent: No Current Homicidal Plan: No Access to Homicidal Means: No Identified Victim: none History of harm to others?: No Assessment of Violence: None Noted Violent Behavior Description: none Does patient have access to weapons?: No Criminal Charges Pending?: No Does patient have a court date: No Prior Inpatient Therapy: Prior Inpatient Therapy: Yes Prior Therapy Dates: (5[facility]19[facility] and [facility] Prior Therapy Facilty/Provider(s): [facility], [facility]) Reason for Treatment: (Mania) Prior Outpatient Therapy: Prior Outpatient Therapy: (unknown [place] Provider)  Past Medical History:  Past Medical History:  Diagnosis Date  . Bipolar affective disorder, currently manic, mild ([place])   . Diabetes mellitus without complication ([place])   . Schizophrenia [facility])     Past Surgical History:  Procedure Laterality Date  . WISDOM TOOTH EXTRACTION     Family History:  Family History  Problem Relation Age of Onset  . Drug abuse Maternal Uncle    Family Psychiatric  History: As listed above.  Social History:  Social History   Substance and Sexual Activity  Alcohol Use No     Social History   Substance and Sexual Activity  Drug Use No    Social History   Socioeconomic History  . Marital status: Legally  Separated    Spouse name: Not on file  . Number of children: Not on file  . Years of education: Not on file  . Highest education level: Not on file  Occupational History  . Not on file   Social Needs  . Financial resource strain: Not on file  . Food insecurity:    Worry: Not on file    Inability: Not on file  . Transportation needs:    Medical: Not on file    Non-medical: Not on file  Tobacco Use  . Smoking status: Never Smoker  . Smokeless tobacco: Never Used  Substance and Sexual Activity  . Alcohol use: No  . Drug use: No  . Sexual activity: Not Currently  Lifestyle  . Physical activity:    Days per week: Not on file    Minutes per session: Not on file  . Stress: Not on file  Relationships  . Social connections:    Talks on phone: Not on file    Gets together: Not on file    Attends religious service: Not on file    Active member of club or organization: Not on file    Attends meetings of clubs or organizations: Not on file    Relationship status: Not on file  Other Topics Concern  . Not on file  Social History Narrative  . Not on file   Additional Social History: N/A    Allergies:   Allergies  Allergen Reactions  . Quetiapine Anaphylaxis    "Pass out"  . Diphenhydramine Hcl   . Gabapentin Other (See Comments)    "Feet on fire"  . Lorazepam   . Risperidone Other (See Comments)    Pt reports "It makes me blind"   . Valproic Acid Other (See Comments)    Pt reports "it makes me too sleepy"   . Aripiprazole Other (See Comments)    Makes patient "too tired"  . Fluphenazine Rash  . Haloperidol Other (See Comments)    Makes feel hot inside. "Tires my tongue"   . Trazodone Other (See Comments)  . Ziprasidone Hcl Palpitations    Labs:  Results for orders placed or performed during the hospital encounter of 11/19/17 (from the past 48 hour(s))  Ethanol     Status: None   Collection Time: 11/19/17  6:08 AM  Result Value Ref Range   Alcohol, Ethyl (B) <10 <10 mg/dL    Comment: (NOTE) Lowest detectable limit for serum alcohol is 10 mg/dL. For medical purposes only. Performed at [facility], [place] [address] [place], [place] [address]   Salicylate level     Status: None   Collection Time: 11/19/17  6:08 AM  Result Value Ref Range   Salicylate Lvl <8.8 2.8 - 30.0 mg/dL    Comment: Performed at [facility], [place] [address] [place], [place] [address]  Acetaminophen level     Status: Abnormal   Collection Time: 11/19/17  6:08 AM  Result Value Ref Range   Acetaminophen (Tylenol), Serum <10 (L) 10 - 30 ug/mL    Comment: (NOTE) Therapeutic concentrations vary significantly. A range of 10-30 ug/mL  may be an effective concentration for many patients. However, some  are best treated at concentrations outside of this range. Acetaminophen concentrations >150 ug/mL at 4 hours after ingestion  and >50 ug/mL at 12 hours after ingestion are often associated with  toxic reactions. Performed at [facility], [place] [address] [place], [place] [address]  CBC with Differential/Platelet     Status: Abnormal   Collection Time: 11/19/17  6:08 AM  Result Value Ref Range   WBC 6.0 4.0 - 10.5 K/uL   RBC 2.98 (L) 3.87 - 5.11 MIL/uL   Hemoglobin 9.8 (L) 12.0 - 15.0 g/dL   HCT 29.0 (L) 36.0 - 46.0 %   MCV 97.3 80.0 - 100.0 fL   MCH 32.9 26.0 - 34.0 pg   MCHC 33.8 30.0 - 36.0 g/dL   RDW 12.4 11.5 - 15.5 %   Platelets 190 150 - 400 K/uL   nRBC 0.0 0.0 - 0.2 %   Neutrophils Relative % 74 %   Neutro Abs 4.5 1.7 - 7.7 K/uL   Lymphocytes Relative 17 %   Lymphs Abs 1.0 0.7 - 4.0 K/uL   Monocytes Relative 8 %   Monocytes Absolute 0.5 0.1 - 1.0 K/uL   Eosinophils Relative 0 %   Eosinophils Absolute 0.0 0.0 - 0.5 K/uL   Basophils Relative 1 %   Basophils Absolute 0.0 0.0 - 0.1 K/uL   Immature Granulocytes 0 %   Abs Immature Granulocytes 0.02 0.00 - 0.07 K/uL    Comment: Performed at [facility], [place] [address], [place], [place] [address]  Comprehensive metabolic panel     Status: Abnormal   Collection Time: 11/19/17  6:38 AM  Result Value Ref Range    Sodium 139 135 - 145 mmol/L   Potassium 3.1 (L) 3.5 - 5.1 mmol/L   Chloride 106 98 - 111 mmol/L   CO2 21 (L) 22 - 32 mmol/L   Glucose, Bld 58 (L) 70 - 99 mg/dL   BUN 14 6 - 20 mg/dL   Creatinine, Ser 0.76 0.44 - 1.00 mg/dL   Calcium 9.1 8.9 - 10.3 mg/dL   Total Protein 7.4 6.5 - 8.1 g/dL   Albumin 3.9 3.5 - 5.0 g/dL   AST 41 15 - 41 U/L   ALT 23 0 - 44 U/L   Alkaline Phosphatase 30 (L) 38 - 126 U/L   Total Bilirubin 1.1 0.3 - 1.2 mg/dL   GFR calc non Af Amer >60 >60 mL/min   GFR calc Af Amer >60 >60 mL/min    Comment: (NOTE) The eGFR has been calculated using the CKD EPI equation. This calculation has not been validated in all clinical situations. eGFR's persistently <60 mL/min signify possible Chronic Kidney Disease.    Anion gap 12 5 - 15    Comment: Performed at [facility], [place] [address], [place], [place] [address]  I-Stat Beta hCG blood, ED ([facility], [facility], [facility] only)     Status: Abnormal   Collection Time: 11/19/17  6:48 AM  Result Value Ref Range   I-stat hCG, quantitative >2,000.0 (H) <5 mIU/mL   Comment 3            Comment:   GEST. AGE      CONC.  (mIU/mL)   <=1 WEEK        5 - 50     2 WEEKS       50 - 500     3 WEEKS       100 - 10,000     4 WEEKS     1,000 - 30,000        FEMALE AND NON-PREGNANT FEMALE:     LESS THAN 5 mIU/mL     Current Facility-Administered Medications  Medication Dose Route Frequency Provider Last Rate Last Dose  . prenatal  vitamin w/FE, FA (PRENATAL 1 + 1) 27-1 MG tablet 1 tablet  1 tablet Oral Q1200 Molpus, John, MD   1 tablet at 11/19/17 1734  . ziprasidone (GEODON) capsule 20 mg  20 mg Oral BID WC Buford Dresser J, DO   20 mg at 11/20/17 0843  . ziprasidone (GEODON) injection 20 mg  20 mg Intramuscular BID PRN Faythe Dingwall, DO       No current outpatient medications on file.    Musculoskeletal: Strength & Muscle Tone: within normal limits Gait & Station: normal Patient leans: N/A  Psychiatric Specialty  Exam: Physical Exam  Review of Systems  Psychiatric/Behavioral: Positive for hallucinations. The patient has insomnia.   All other systems reviewed and are negative.   Blood pressure 114/80, pulse (!) 107, temperature 98 F (36.7 C), temperature source Oral, resp. rate (!) 22, SpO2 100 %.There is no height or weight on file to calculate BMI.  General Appearance: Casual and Guarded  Eye Contact:  Fair  Speech:  Clear and Coherent and Pressured  Volume:  Increased  Mood:  Anxious and Irritable  Affect:  Congruent and Labile  Thought Process:  Disorganized  Orientation:  Other:  person and place  Thought Content:  Illogical, Ideas of Reference:   Paranoia, Rumination and Tangential  Suicidal Thoughts:  No  Homicidal Thoughts:  No  Memory:  Immediate;   Fair Recent;   Fair Remote;   Fair  Judgement:  Impaired  Insight:  Shallow  Psychomotor Activity:  Increased  Concentration:  Concentration: Fair and Attention Span: Fair  Recall:  [employer] of Knowledge:  Fair  Language:  Good  Akathisia:  No  Handed:  Right  AIMS (if indicated):   N/A  Assets:  Communication Skills Resilience  ADL's:  Intact  Cognition:  WNL  Sleep:   N/A     Treatment Plan Summary: Daily contact with patient to assess and evaluate symptoms and progress in treatment and Medication management (see MAR)  Disposition: Recommend psychiatric Inpatient admission when medically cleared. TTS to seek placement  Ethelene Hal, NP 11/20/2017 11:25 AM   Patient seen face-to-face for psychiatric evaluation, chart reviewed and case discussed with the physician extender and developed treatment plan. Reviewed the information documented and agree with the treatment plan.  Buford Dresser, DO 11/20/17 1:26 PM

## 2017-11-20 NOTE — ED Notes (Signed)
Patient standing at nurse's station responding to internal stimuli.

## 2017-11-20 NOTE — ED Notes (Signed)
Patient has become louder and louder. Patient constantly come put in hallway being loud and disturbing other patient. Patient has not slept none on this shift. Staff is unable to re-direct patient at this time. Encouragement and support provided and safety maintain. Q 15 mins safety check remain in place and video monitoring.

## 2017-11-20 NOTE — ED Notes (Signed)
Patient continuing to walk around the unit but is quieting down.  Nurse suggested that patient go lie down and try to get some sleep.  Patient continued to talk and walk around then appeared to be getting dizzy and unsafe.  Assisted patient to her bed and offered something to drink.  Patient lying down at the moment.

## 2017-11-20 NOTE — BH Assessment (Signed)
[facility] Assessment Progress Note  Per Buford Dresser, DO, this pt requires psychiatric hospitalization at this time.  Pt is presents under IVC initiated by EDP Lorenza [place], MD.  The following facilities have been contacted to seek placement for this pt, with results as noted:  Beds available, information sent, decision pending:  [place]   At capacity:  La[facility] Penning [place], [place] Coordinator [phone]

## 2017-11-21 DIAGNOSIS — F250 Schizoaffective disorder, bipolar type: Secondary | ICD-10-CM | POA: Diagnosis not present

## 2017-11-21 DIAGNOSIS — F312 Bipolar disorder, current episode manic severe with psychotic features: Secondary | ICD-10-CM | POA: Diagnosis not present

## 2017-11-21 MED ORDER — STERILE WATER FOR INJECTION IJ SOLN
INTRAMUSCULAR | Status: AC
  Administered 2017-11-21: 10 mL
  Filled 2017-11-21: qty 10

## 2017-11-21 MED ORDER — OLANZAPINE 10 MG PO TABS
10.0000 mg | ORAL_TABLET | Freq: Two times a day (BID) | ORAL | Status: DC
  Administered 2017-11-21 – 2017-11-22 (×4): 10 mg via ORAL
  Filled 2017-11-21 (×4): qty 1

## 2017-11-21 MED ORDER — DIPHENHYDRAMINE HCL 50 MG/ML IJ SOLN
50.0000 mg | Freq: Once | INTRAMUSCULAR | Status: AC
  Administered 2017-11-21: 50 mg via INTRAMUSCULAR
  Filled 2017-11-21: qty 1
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
## 2017-11-21 NOTE — ED Notes (Signed)
Patient willingly took injection.

## 2017-11-21 NOTE — ED Notes (Signed)
P denies any si,hi, or avh at this time. Pt denies any pain or discomfort at this time.Pt repeatedly makes statements about her living condition. Pt explains to nurse she needs asocial worker and that she cant return to the motel. Pt currently taking a shower. Will continue to monitor.
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
## 2017-11-21 NOTE — ED Notes (Signed)
Pt currently resting In bed asleep. Will assess at a later time. Will continue to monitor.

## 2017-11-21 NOTE — ED Notes (Signed)
Patient in hallway yelling.  Unable to redirect patient.

## 2017-11-21 NOTE — ED Notes (Addendum)
Patient in hallway with loud verbal outburst.  Tangential speech.  Thoughts are disorganized.

## 2017-11-21 NOTE — Progress Notes (Addendum)
This patient continues to meet inpatient criteria. CSW faxed information to the following facilities:   [facility]- declined [place]- has beds, reviewing patient [place], [place] Social Worker [phone]

## 2017-11-22 DIAGNOSIS — F312 Bipolar disorder, current episode manic severe with psychotic features: Secondary | ICD-10-CM | POA: Diagnosis not present

## 2017-11-22 LAB — RAPID URINE DRUG SCREEN, HOSP PERFORMED
AMPHETAMINES: NOT DETECTED
Barbiturates: NOT DETECTED
Benzodiazepines: NOT DETECTED
Cocaine: NOT DETECTED
Opiates: NOT DETECTED
TETRAHYDROCANNABINOL: NOT DETECTED

## 2017-11-22 NOTE — Consult Note (Signed)
[facility] Psych ED Progress Note  11/22/2017 11:24 AM [name] [name]  MRN:  [id number] Subjective: " You need to discharge me today, otherwise I will mess someone up!''  Objective:  Patient seen, chart reviewed and case discussed with the treatment team. Patient has been threatening to mess people up if she is not discharged today. She remains extremely angry, irritable, labile, moody, easily agitated, combative, yelling and has continued to exhibit bizarre/disorganized behavior. She is defiant, uncooperative, oppositional and argumentative. She has been observed talking to herself anf yelling at the imaginary figures.   Principal Problem: Schizoaffective disorder, bipolar type ([place]) Diagnosis:   Patient Active Problem List   Diagnosis Date Noted  . Schizoaffective disorder, bipolar type ([place]) [F25.0] 01/28/2015    Priority: High  . Adjustment disorder with mixed disturbance of emotions and conduct [F43.25] 07/11/2017  . Insomnia [G47.00]   . Anxiety state [F41.1]   . Overactive bladder [N32.81]   . Diabetes mellitus ([place]) [E11.9] 02/08/2015  . Non compliance w medication regimen [Z91.14]    Total Time spent with patient: 30 minutes  Past Psychiatric History: as above  Past Medical History:  Past Medical History:  Diagnosis Date  . Bipolar affective disorder, currently manic, mild ([place])   . Diabetes mellitus without complication ([place])   . Schizophrenia [facility])     Past Surgical History:  Procedure Laterality Date  . WISDOM TOOTH EXTRACTION     Family History:  Family History  Problem Relation Age of Onset  . Drug abuse Maternal Uncle    Family Psychiatric  History:  Social History:  Social History   Substance and Sexual Activity  Alcohol Use No     Social History   Substance and Sexual Activity  Drug Use No    Social History   Socioeconomic History  . Marital status: Legally Separated    Spouse name: Not on file  . Number of children: Not on file  . Years  of education: Not on file  . Highest education level: Not on file  Occupational History  . Not on file  Social Needs  . Financial resource strain: Not on file  . Food insecurity:    Worry: Not on file    Inability: Not on file  . Transportation needs:    Medical: Not on file    Non-medical: Not on file  Tobacco Use  . Smoking status: Never Smoker  . Smokeless tobacco: Never Used  Substance and Sexual Activity  . Alcohol use: No  . Drug use: No  . Sexual activity: Not Currently  Lifestyle  . Physical activity:    Days per week: Not on file    Minutes per session: Not on file  . Stress: Not on file  Relationships  . Social connections:    Talks on phone: Not on file    Gets together: Not on file    Attends religious service: Not on file    Active member of club or organization: Not on file    Attends meetings of clubs or organizations: Not on file    Relationship status: Not on file  Other Topics Concern  . Not on file  Social History Narrative  . Not on file    Sleep: Poor  Appetite:  Fair  Current Medications: Current Facility-Administered Medications  Medication Dose Route Frequency Provider Last Rate Last Dose  . diphenhydrAMINE (BENADRYL) capsule 25 mg  25 mg Oral QHS,MR X 1 Faythe Dingwall, DO   25 mg at 11/21/17  2232  . OLANZapine (ZYPREXA) tablet 10 mg  10 mg Oral BID Patrecia Pour, NP   10 mg at 11/22/17 0933  . prenatal vitamin w/FE, FA (PRENATAL 1 + 1) 27-1 MG tablet 1 tablet  1 tablet Oral Q1200 Molpus, John, MD   1 tablet at 11/20/17 1208   No current outpatient medications on file.    Lab Results: No results found for this or any previous visit (from the past 48 hour(s)).  Blood Alcohol level:  Lab Results  Component Value Date   ETH <10 11/19/2017   ETH <10 10/06/2017    Physical Findings: AIMS:  , ,  ,  ,    CIWA:    COWS:     Musculoskeletal: Strength & Muscle Tone: within normal limits Gait & Station: normal Patient leans:  N/A  Psychiatric Specialty Exam: Physical Exam  Psychiatric: Her affect is angry and labile. Her speech is rapid and/or pressured and tangential. She is agitated, aggressive and actively hallucinating. Thought content is paranoid and delusional. Cognition and memory are normal. She expresses impulsivity.    Review of Systems  Constitutional: Negative.   HENT: Negative.   Eyes: Negative.   Respiratory: Negative.   Cardiovascular: Negative.   Gastrointestinal: Negative.   Genitourinary: Negative.   Musculoskeletal: Negative.   Skin: Negative.   Neurological: Negative.   Endo/Heme/Allergies: Negative.   Psychiatric/Behavioral: Positive for hallucinations.    Blood pressure 113/77, pulse (!) 114, temperature 98.3 F (36.8 C), temperature source Oral, resp. rate 20, SpO2 100 %.There is no height or weight on file to calculate BMI.  General Appearance: Casual  Eye Contact:  Minimal  Speech:  Pressured  Volume:  Increased  Mood:  Angry and Irritable  Affect:  Labile  Thought Process:  Disorganized  Orientation:  Full (Time, Place, and Person)  Thought Content:  Illogical, Hallucinations: Auditory and Paranoid Ideation  Suicidal Thoughts:  No  Homicidal Thoughts:  No  Memory:  Immediate;   Fair Recent;   Fair Remote;   Fair  Judgement:  Poor  Insight:  Lacking  Psychomotor Activity:  Increased and Restlessness  Concentration:  Concentration: Poor and Attention Span: Poor  Recall:  [employer] of Knowledge:  Fair  Language:  Good  Akathisia:  No  Handed:  Right  AIMS (if indicated):     Assets:  Communication Skills  ADL's:  Intact  Cognition:  WNL  Sleep:   poor      Treatment Plan Summary: Daily contact with patient to assess and evaluate symptoms and progress in treatment and Medication management  Continue Zyprexa 10 mg twice daily for Bipolar/psychosis.  Disposition: Recommend psychiatric Inpatient admission, patient is a danger to self and others.   Corena Pilgrim, MD 11/22/2017, 11:24 AM

## 2017-11-22 NOTE — ED Notes (Signed)
Pt refused vitals. Pt states "Im not sick and I don't want anyone to touch me". Nurse emphasize the importance of the vitals. Pt continue to refused vital signs.

## 2017-11-22 NOTE — ED Notes (Signed)
Pt at nursing station after being redirected by nursing staff and security several times, However pt continues to yell at nursing staff making accusatory statements to staff about the care she is being provided and demanding that she is allowed to go home today.

## 2017-11-22 NOTE — ED Notes (Signed)
Patient yelling refusing to take medications unless Dr sees her take it. Dr. Loni Muse informed and stood at door while patient takes med. Patient took med and threw water all over floor.

## 2017-11-22 NOTE — ED Notes (Signed)
Patient back in room sleeping.

## 2017-11-22 NOTE — ED Notes (Signed)
Patient requesting cab voucher upon discharge.

## 2017-11-22 NOTE — ED Notes (Signed)
Pt continuously at nurses station, increasingly verbal aggressive towards staff, needing a great deal of redirection. Pt is hyper religious, hyper focused on each individual staff member, verbally attacking staff members physical features.

## 2017-11-22 NOTE — ED Notes (Signed)
Patient concerned about belongings. Report property is at the [employer] on [address]. Called, front desk personnel stated that property is still in room and would not be moved until 2 days. Belongings would be stored in the storage closet. Patient verbalized understanding.

## 2017-11-22 NOTE — ED Notes (Signed)
Patient requesting to decrease Zyprexa to 5mg .

## 2017-11-23 ENCOUNTER — Emergency Department (HOSPITAL_COMMUNITY): Payer: Medicare HMO

## 2017-11-23 DIAGNOSIS — F250 Schizoaffective disorder, bipolar type: Secondary | ICD-10-CM | POA: Diagnosis not present

## 2017-11-23 DIAGNOSIS — F312 Bipolar disorder, current episode manic severe with psychotic features: Secondary | ICD-10-CM | POA: Diagnosis not present

## 2017-11-23 MED ORDER — STERILE WATER FOR INJECTION IJ SOLN
INTRAMUSCULAR | Status: AC
  Administered 2017-11-23: 10 mL
  Filled 2017-11-23: qty 10

## 2017-11-23 MED ORDER — OLANZAPINE 10 MG IM SOLR
10.0000 mg | Freq: Once | INTRAMUSCULAR | Status: AC
  Administered 2017-11-23: 10 mg via INTRAMUSCULAR
  Filled 2017-11-23: qty 10

## 2017-11-23 MED ORDER — OLANZAPINE 10 MG PO TABS
10.0000 mg | ORAL_TABLET | Freq: Two times a day (BID) | ORAL | Status: DC
  Administered 2017-11-23 – 2017-11-29 (×12): 10 mg via ORAL
  Filled 2017-11-23 (×13): qty 1

## 2017-11-23 MED ORDER — FOLIC ACID 1 MG PO TABS
3.0000 mg | ORAL_TABLET | Freq: Every day | ORAL | Status: DC
  Administered 2017-11-23 – 2017-11-29 (×7): 3 mg via ORAL
  Filled 2017-11-23 (×7): qty 3

## 2017-11-23 MED ORDER — OLANZAPINE 10 MG IM SOLR
10.0000 mg | Freq: Two times a day (BID) | INTRAMUSCULAR | Status: DC
  Filled 2017-11-23: qty 10

## 2017-11-23 MED ORDER — STERILE WATER FOR INJECTION IJ SOLN
INTRAMUSCULAR | Status: AC
  Administered 2017-11-23: 10:00:00
  Filled 2017-11-23: qty 10

## 2017-11-23 MED ORDER — DIPHENHYDRAMINE HCL 50 MG/ML IJ SOLN
50.0000 mg | Freq: Once | INTRAMUSCULAR | Status: AC
  Administered 2017-11-23: 50 mg via INTRAMUSCULAR
  Filled 2017-11-23: qty 1

## 2017-11-23 NOTE — ED Provider Notes (Signed)
Patient is currently psychotic. I believe she needs forced medications to assist in her treatment.   Sherwood Gambler, MD 11/23/17 [phone]

## 2017-11-23 NOTE — ED Notes (Signed)
Pt refusing zyprexa, Pt behavior escalating, paranoid, delusional. This nurse notified Kennyth Lose MD of pt behavior.

## 2017-11-23 NOTE — ED Notes (Signed)
Pt awake, alert & responsive, no distress noted, calm at present.  Pt watching TV at present.  Monitoring for safety, Q 15 min checks in effect.

## 2017-11-23 NOTE — ED Notes (Addendum)
[place] [employer] at bedside. Pt verbally aggressive, yelling, threatening, refusing ultra sound at this time. Tanika,NP notified.

## 2017-11-23 NOTE — Consult Note (Signed)
[facility] Psych ED Progress Note  11/23/2017 10:47 AM [name] [name]  MRN:  [id number] Subjective:   [name] [name] is disorganized in behavior and thought process today. She is irritable. She reports that she would like to be discharged because she was disrespected by staff. She is paranoid and would not like the treatment team to come pass a certain point in her room. She reports, "Do not step over that line." She has been refusing her medications and warrants forced medications.   Principal Problem: Schizoaffective disorder, bipolar type ([place]) Diagnosis:   Patient Active Problem List   Diagnosis Date Noted  . Adjustment disorder with mixed disturbance of emotions and conduct [F43.25] 07/11/2017  . Insomnia [G47.00]   . Anxiety state [F41.1]   . Overactive bladder [N32.81]   . Diabetes mellitus ([place]) [E11.9] 02/08/2015  . Schizoaffective disorder, bipolar type ([place]) [F25.0] 01/28/2015  . Non compliance w medication regimen [Z91.14]    Total Time spent with patient: 15 minutes  Past Psychiatric History: Schizoaffective disorder, bipolar type, anxiety and adjustment disorder.   Past Medical History:  Past Medical History:  Diagnosis Date  . Bipolar affective disorder, currently manic, mild ([place])   . Diabetes mellitus without complication ([place])   . Schizophrenia [facility])     Past Surgical History:  Procedure Laterality Date  . WISDOM TOOTH EXTRACTION     Family History:  Family History  Problem Relation Age of Onset  . Drug abuse Maternal Uncle    Family Psychiatric  History: As listed above.  Social History:  Social History   Substance and Sexual Activity  Alcohol Use No     Social History   Substance and Sexual Activity  Drug Use No    Social History   Socioeconomic History  . Marital status: Legally Separated    Spouse name: Not on file  . Number of children: Not on file  . Years of education: Not on file  . Highest education level: Not on file  Occupational  History  . Not on file  Social Needs  . Financial resource strain: Not on file  . Food insecurity:    Worry: Not on file    Inability: Not on file  . Transportation needs:    Medical: Not on file    Non-medical: Not on file  Tobacco Use  . Smoking status: Never Smoker  . Smokeless tobacco: Never Used  Substance and Sexual Activity  . Alcohol use: No  . Drug use: No  . Sexual activity: Not Currently  Lifestyle  . Physical activity:    Days per week: Not on file    Minutes per session: Not on file  . Stress: Not on file  Relationships  . Social connections:    Talks on phone: Not on file    Gets together: Not on file    Attends religious service: Not on file    Active member of club or organization: Not on file    Attends meetings of clubs or organizations: Not on file    Relationship status: Not on file  Other Topics Concern  . Not on file  Social History Narrative  . Not on file    Sleep: Good  Appetite:  Good  Current Medications: Current Facility-Administered Medications  Medication Dose Route Frequency Provider Last Rate Last Dose  . diphenhydrAMINE (BENADRYL) capsule 25 mg  25 mg Oral QHS,MR X 1 Faythe Dingwall, DO   25 mg at 11/22/17 2334  . OLANZapine (ZYPREXA) tablet 10 mg  10 mg Oral BID Patrecia Pour, NP   10 mg at 11/22/17 2335  . prenatal vitamin w/FE, FA (PRENATAL 1 + 1) 27-1 MG tablet 1 tablet  1 tablet Oral Q1200 Molpus, John, MD   1 tablet at 11/22/17 1440   No current outpatient medications on file.    Lab Results: No results found for this or any previous visit (from the past 48 hour(s)).  Blood Alcohol level:  Lab Results  Component Value Date   ETH <10 11/19/2017   ETH <10 10/06/2017    Musculoskeletal: Strength & Muscle Tone: within normal limits Gait & Station: normal Patient leans: N/A  Psychiatric Specialty Exam: Physical Exam  Nursing note and vitals reviewed. Constitutional: She is oriented to person, place, and time.  She appears well-developed and well-nourished.  HENT:  Head: Normocephalic and atraumatic.  Neck: Normal range of motion.  Respiratory: Effort normal.  Musculoskeletal: Normal range of motion.  Neurological: She is alert and oriented to person, place, and time.  Psychiatric: Her affect is labile. Her speech is rapid and/or pressured. She is agitated. Thought content is paranoid. Cognition and memory are impaired. She expresses impulsivity.    ROS  Blood pressure 114/86, pulse (!) 115, temperature 97.9 F (36.6 C), temperature source Oral, resp. rate 18, SpO2 100 %.There is no height or weight on file to calculate BMI.  General Appearance: Fairly Groomed, young, African American female, wearing paper hospital scrubs who is sitting in a chair in her room. NAD.   Eye Contact:  Good  Speech:  Pressured  Volume:  Increased  Mood:  Dysphoric  Affect:  Labile  Thought Process:  Disorganized and Descriptions of Associations: Tangential  Orientation:  Full (Time, Place, and Person)  Thought Content:  Illogical, Delusions and Paranoid Ideation  Suicidal Thoughts:  No  Homicidal Thoughts:  No  Memory:  Immediate;   Good Recent;   Good Remote;   Good  Judgement:  Impaired  Insight:  Shallow  Psychomotor Activity:  Increased  Concentration:  Concentration: Good and Attention Span: Good  Recall:  Good  Fund of Knowledge:  Good  Language:  Good  Akathisia:  No  Handed:  Right  AIMS (if indicated):   N/A  Assets:  Communication Skills Housing  ADL's:  Intact  Cognition:  WNL  Sleep:   N/A   Assessment:  [name] [name] is a 37 y.o. female who was admitted with psychosis and agitation. She continues to warrant inpatient psychiatric hospitalization for manic symptoms and disorganized thoughts/behavior.   Treatment Plan Summary: Daily contact with patient to assess and evaluate symptoms and progress in treatment and Medication management  -Continue Zyprexa 10 mg BID for  schizoaffective disorder. Forced medications ordered if she refuses PO.  -Continue Benadryl 25 mg qhs for insomnia.   Faythe Dingwall, DO 11/23/2017, 10:47 AM

## 2017-11-23 NOTE — ED Notes (Addendum)
Pt behavior continues to escalate, knocking down room doors, verbally aggressive with nursing staff, pt behavior not redirectable. This nurse notified Kennyth Lose MD.

## 2017-11-23 NOTE — Progress Notes (Signed)
Pt. meets criteria for inpatient treatment per Leilani Merl, DNP.  Referred out to the following hospitals:  [facility]    Disposition CSW will continue to follow for placement.  Areatha Keas. Judi Cong, MSW, [place] Disposition Clinical Social Work [phone] (cell) [phone] (office)

## 2017-11-23 NOTE — ED Notes (Signed)
Pt remains agitated, rambling nonsensical, religiously preoccupied.  Pt requested po meds instead of IM injection.  Tolerated po med with assistance of Security, standing at bedside.

## 2017-11-23 NOTE — ED Notes (Signed)
Pt banging on glass at nurses station, cursing at staff, unredirectable back to room.

## 2017-11-23 NOTE — ED Notes (Signed)
Patient at nurse's station yelling at staff.  After multiple attempts at redirection patient returned to her room.

## 2017-11-23 NOTE — ED Notes (Signed)
Pt taking a shower,

## 2017-11-23 NOTE — ED Notes (Signed)
Pt cursing at staff, requesting to be discharged home.  Pt redirected back to room.

## 2017-11-24 ENCOUNTER — Emergency Department (HOSPITAL_COMMUNITY): Payer: Medicare HMO

## 2017-11-24 DIAGNOSIS — F312 Bipolar disorder, current episode manic severe with psychotic features: Secondary | ICD-10-CM | POA: Diagnosis not present

## 2017-11-24 DIAGNOSIS — O0001 Abdominal pregnancy with intrauterine pregnancy: Secondary | ICD-10-CM | POA: Diagnosis not present

## 2017-11-24 DIAGNOSIS — Z3A17 17 weeks gestation of pregnancy: Secondary | ICD-10-CM | POA: Diagnosis not present

## 2017-11-24 NOTE — ED Notes (Signed)
Ultrasound notified that patient consented to having ultrasound done today.  Ultrasound tech said she did not know when they would be over as she was working short staffed today.

## 2017-11-24 NOTE — ED Notes (Signed)
Pt refused vital signs.

## 2017-11-24 NOTE — ED Notes (Signed)
Patient continuously up at the window saying she wants to leave and could we call [facility] for her.  Then she will get on the phone and talk continuously to an unknown person.  Patient denies that she has any mental health issues.

## 2017-11-24 NOTE — ED Notes (Signed)
Patient had untrasound done.

## 2017-11-24 NOTE — ED Notes (Signed)
Pt alert and cooperative at this time. Pt denies and pain or discomfort. Pt irritable, and making statements about going home. Pt appears to be paranoid at times. Pt easily redirectable at this time. Pt denies any si,hi, and avh at this time. Pt stable will. Will continue to monitor.

## 2017-11-25 DIAGNOSIS — F312 Bipolar disorder, current episode manic severe with psychotic features: Secondary | ICD-10-CM | POA: Diagnosis not present

## 2017-11-25 DIAGNOSIS — F23 Brief psychotic disorder: Secondary | ICD-10-CM | POA: Diagnosis not present

## 2017-11-25 DIAGNOSIS — F250 Schizoaffective disorder, bipolar type: Secondary | ICD-10-CM | POA: Diagnosis not present

## 2017-11-25 DIAGNOSIS — Z79899 Other long term (current) drug therapy: Secondary | ICD-10-CM | POA: Diagnosis not present
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
## 2017-11-25 NOTE — ED Notes (Signed)
Patient back in her room now.  Patient not able to be redirected well, but at times will return to her room and remain quiet.  Nurse reassured her that the staff was doing everything they could to find a hospital for her but sometimes it takes a while.

## 2017-11-25 NOTE — ED Notes (Addendum)
Patient up a window talking with pressured speech continually asking to go home.  Patient thinks that her current medication is keeping her awake, but still is willing to take it and is asking what time it is due.  Nurse reassured patient that she would get her medication as soon as it is due.  Patient continues to talk without stopping.  Night shift notified this writer that patient only slept several hours last night.
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
## 2017-11-25 NOTE — [facility] Counselor (Signed)
11/25/17- Per Mendel Ryder, [facility] patient is declined at [facility]. TTS working to secure placement at other facilities.

## 2017-11-25 NOTE — ED Notes (Signed)
Pt refused vital signs.

## 2017-11-25 NOTE — Progress Notes (Signed)
Disposition CSW contacted [facility] and spoke to Marlowe Kays, Intake and Assessment for that program..  Cecille Rubin advised that there were no beds available but did refer this writer to the [facility] patient flow (access).  CSW will fax referral.  Areatha Keas. Judi Cong, MSW, [place] Disposition Clinical Social Work [phone] (cell) [phone] (office)

## 2017-11-25 NOTE — Progress Notes (Addendum)
Pt. meets criteria for inpatient treatment per Leilani Merl, DO.  Referred out to the following hospital via paper fax as [facility] fax number is incorrect in system. Faxed to [phone]  Disposition CSW will continue to follow for placement.  Areatha Keas. Judi Cong, MSW, [place] Disposition Clinical Social Work [phone] (cell) [phone] (office)

## 2017-11-25 NOTE — ED Notes (Signed)
Patient is anxious at this time. Encouragement and support provided and safety maintain. Q 15 min safety checks remain in place and video monitoring.

## 2017-11-25 NOTE — ED Notes (Signed)
Patient took po medications without difficulty.

## 2017-11-26 DIAGNOSIS — F312 Bipolar disorder, current episode manic severe with psychotic features: Secondary | ICD-10-CM | POA: Diagnosis not present

## 2017-11-26 MED ORDER — HYDROCORTISONE 1 % EX CREA
TOPICAL_CREAM | Freq: Three times a day (TID) | CUTANEOUS | Status: DC
  Administered 2017-11-26 – 2017-11-27 (×4): via TOPICAL
  Filled 2017-11-26: qty 28

## 2017-11-26 NOTE — ED Notes (Signed)
Patient has been calm and cooperative during this shift. Patient has rested good thru out the night.
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
## 2017-11-26 NOTE — ED Notes (Signed)
Pt taking a shower 

## 2017-11-26 NOTE — ED Notes (Signed)
Pt restless, demanding, argumentative with nursing staff. Pt requires frequent redirection.paranoia noted. Encouragement and support provided. Special checks q 15 mins in place for safety, Video monitoring in place. Will continue to monitor.

## 2017-11-26 NOTE — ED Notes (Signed)
Patient is denies SI/HI/AVH at this time.

## 2017-11-26 NOTE — ED Notes (Signed)
Patient has fine red rash to left forearm. Dr. Nat Christen contacted and order for Hydrocortisone cream placed. Orders read back and verified.

## 2017-11-27 DIAGNOSIS — F23 Brief psychotic disorder: Secondary | ICD-10-CM

## 2017-11-27 DIAGNOSIS — F312 Bipolar disorder, current episode manic severe with psychotic features: Secondary | ICD-10-CM | POA: Diagnosis not present

## 2017-11-27 DIAGNOSIS — F250 Schizoaffective disorder, bipolar type: Secondary | ICD-10-CM | POA: Diagnosis not present

## 2017-11-27 NOTE — ED Notes (Signed)
Patient has been calm and cooperative during this shift. Patient has slept good during this shift. Encouragement and support provided and safety maintain. Q 15 min safety checks remain in place and video monitoring.

## 2017-11-27 NOTE — Consult Note (Addendum)
[facility] Consult   Reason for Consult:  Bizarre and psychotic behavior Referring Physician:  EDP Patient Identification: [name] [name] MRN:  [id number] Principal Diagnosis: Schizoaffective disorder, bipolar type ([place]) Diagnosis:  Principal Problem:   Schizoaffective disorder, bipolar type ([place]) Active Problems:   Acute psychosis ([place])   Total Time spent with patient: 15 minutes  Subjective:   Today during evaluation: Pt reports she slept last night and ate her breakfast. She is dressed in blue paper hospital gowns (two of them) and also has one blue gown tied around her head, turban style. She appears calm at first but then begins to state (to the doctor) "I am sitting here calm, in my clothes and you are standing there in your clothes and you have a jealousy issue at home." She was also asking to have her Zyprexa dose decreased and demanded the doctor comply with this request. She remains intrusive and at times will present to the nurses station and become agitated and demand to leave the hospital.  Patient does not express any thought of hurting herself or others and she has no reported drug use.  Patient's presentation on this occasion is much better than her presentation during her last ED admission.  Patient's Aunt was contacted for collateral and she stated this is not [name] [name]'s baseline.   Of note, pt is [redacted] weeks pregnant, repeat OB ultrasound repeated on 11-24-2017 and is within normal limits and shows normal fetal growth with fetal heart rate of 149 bpm. She continues to require inpatient psychiatric hospitalization for crisis stabilization and medication management.   Past Psychiatric History: Schizophrenia  Risk to Self: Suicidal Ideation: No Suicidal Intent: No Is patient at risk for suicide?: No Suicidal Plan?: No Access to Means: No What has been your use of drugs/alcohol within the last 12 months?: (none reported) How many times?: (none  reported) Other Self Harm Risks: (non-compliant wit medications) Triggers for Past Attempts: None known Intentional Self Injurious Behavior: None Risk to Others: Homicidal Ideation: No Thoughts of Harm to Others: No Current Homicidal Intent: No Current Homicidal Plan: No Access to Homicidal Means: No Identified Victim: none History of harm to others?: No Assessment of Violence: None Noted Violent Behavior Description: none Does patient have access to weapons?: No Criminal Charges Pending?: No Does patient have a court date: No Prior Inpatient Therapy: Prior Inpatient Therapy: Yes Prior Therapy Dates: (5/19-[facility] and 10/19 at [facility]) Prior Therapy Facilty/Provider(s): [facility], [facility]) Reason for Treatment: (Mania) Prior Outpatient Therapy: Prior Outpatient Therapy: (unknown [facility] Provider)  Past Medical History:  Past Medical History:  Diagnosis Date  . Bipolar affective disorder, currently manic, mild ([place])   . Diabetes mellitus without complication ([place])   . Schizophrenia [facility])     Past Surgical History:  Procedure Laterality Date  . WISDOM TOOTH EXTRACTION     Family History:  Family History  Problem Relation Age of Onset  . Drug abuse Maternal Uncle    Family Psychiatric  History: As listed above.  Social History:  Social History   Substance and Sexual Activity  Alcohol Use No     Social History   Substance and Sexual Activity  Drug Use No    Social History   Socioeconomic History  . Marital status: Legally Separated    Spouse name: Not on file  . Number of children: Not on file  . Years of education: Not on file  . Highest education level: Not on file  Occupational History  . Not  on file  Social Needs  . Financial resource strain: Not on file  . Food insecurity:    Worry: Not on file    Inability: Not on file  . Transportation needs:    Medical: Not on file    Non-medical: Not on file  Tobacco Use  . Smoking status: Never Smoker  .  Smokeless tobacco: Never Used  Substance and Sexual Activity  . Alcohol use: No  . Drug use: No  . Sexual activity: Not Currently  Lifestyle  . Physical activity:    Days per week: Not on file    Minutes per session: Not on file  . Stress: Not on file  Relationships  . Social connections:    Talks on phone: Not on file    Gets together: Not on file    Attends religious service: Not on file    Active member of club or organization: Not on file    Attends meetings of clubs or organizations: Not on file    Relationship status: Not on file  Other Topics Concern  . Not on file  Social History Narrative  . Not on file   Additional Social History: N/A    Allergies:   Allergies  Allergen Reactions  . Quetiapine Anaphylaxis    "Pass out"  . Gabapentin Other (See Comments)    "Feet on fire"  . Lorazepam   . Risperidone Other (See Comments)    Pt reports "It makes me blind"   . Valproic Acid Other (See Comments)    Pt reports "it makes me too sleepy"   . Aripiprazole Other (See Comments)    Makes patient "too tired"  . Fluphenazine Rash  . Haloperidol Other (See Comments)    Makes feel hot inside. "Tires my tongue"   . Trazodone Other (See Comments)  . Ziprasidone Hcl Palpitations    Labs: No results found for this or any previous visit (from the past 48 hour(s)).  Current Facility-Administered Medications  Medication Dose Route Frequency Provider Last Rate Last Dose  . diphenhydrAMINE (BENADRYL) capsule 25 mg  25 mg Oral QHS,MR X 1 Faythe Dingwall, DO   25 mg at 19/50/93 2671  . folic acid (FOLVITE) tablet 3 mg  3 mg Oral Daily Sherwood Gambler, MD   3 mg at 11/27/17 0953  . hydrocortisone cream 1 %   Topical TID Nat Christen, MD      . OLANZapine [facility]) tablet 10 mg  10 mg Oral BID Faythe Dingwall, DO   10 mg at 11/27/17 2458   Or  . OLANZapine (ZYPREXA) injection 10 mg  10 mg Intramuscular BID Faythe Dingwall, DO      . prenatal vitamin w/FE, FA  (PRENATAL 1 + 1) 27-1 MG tablet 1 tablet  1 tablet Oral Q1200 Molpus, John, MD   1 tablet at 11/26/17 1224   No current outpatient medications on file.    Musculoskeletal: Strength & Muscle Tone: within normal limits Gait & Station: normal Patient leans: N/A  Psychiatric Specialty Exam: Physical Exam  Nursing note and vitals reviewed. Constitutional: She is oriented to person, place, and time. She appears well-developed and well-nourished.  HENT:  Head: Normocephalic and atraumatic.  Neck: Normal range of motion.  Cardiovascular: Normal rate.  Respiratory: Effort normal.  Musculoskeletal: Normal range of motion.  Neurological: She is alert and oriented to person, place, and time.  Skin: Skin is warm.  Psychiatric: Her affect is labile. Her speech is tangential. She is  agitated. Thought content is delusional. Cognition and memory are impaired. She expresses impulsivity.    Review of Systems  Psychiatric/Behavioral: Positive for hallucinations. Negative for substance abuse and suicidal ideas.  All other systems reviewed and are negative.   Blood pressure 114/86, pulse (!) 115, temperature 97.9 F (36.6 C), temperature source Oral, resp. rate 18, SpO2 100 %.There is no height or weight on file to calculate BMI.  General Appearance: Fairly Groomed, young, African American female, wearing paper hospital scrubs (and hospital scrubs used as a hair wrap) who is sitting in bed. NAD.   Eye Contact:  Good  Speech:  Pressured  Volume:  Increased  Mood:  Irritable  Affect:  Labile  Thought Process:  Disorganized, Irrelevant and Descriptions of Associations: Tangential  Orientation:  Full (Time, Place, and Person)  Thought Content:  Illogical and Tangential  Suicidal Thoughts:  No  Homicidal Thoughts:  No  Memory:  Immediate;   Fair Recent;   Fair Remote;   Fair  Judgement:  Impaired  Insight:  Lacking  Psychomotor Activity:  Normal  Concentration:  Concentration: Fair and Attention  Span: Fair  Recall:  Good  Fund of Knowledge:  Fair  Language:  Fair  Akathisia:  No  Handed:  Right  AIMS (if indicated):   N/A  Assets:  Financial Resources/Insurance Resilience  ADL's:  Intact  Cognition:  WNL  Sleep:   Okay    Assessment:  Taniaya Rudder is a 37 y.o. female who was admitted with agitation and psychosis. She continues to warrant inpatient psychiatric hospitalization for further stabilization and treatment. She remains disorganized in thought process and behavior although gradually improving.   Treatment Plan Summary: Daily contact with patient to assess and evaluate symptoms and progress in treatment and Medication management  -Continue Zyprexa 10 mg BID for psychosis/agitation. Patient has forced medication orders but has been taking PO medications.  -Continue Benadryl 25 mg qhs for insomnia.  -Folvite 3 mg daily -Prenatal vitamin daily   Disposition: Recommend psychiatric Inpatient admission when medically cleared. TTS to seek placement  Ethelene Hal, NP 11/27/2017 1:18 PM   Patient seen face-to-face for psychiatric evaluation, chart reviewed and case discussed with the physician extender and developed treatment plan. Reviewed the information documented and agree with the treatment plan.  Buford Dresser, DO 11/27/17 5:26 PM

## 2017-11-27 NOTE — ED Notes (Signed)
Pt took another shower. She is basically calm and cooperative. Sometimes she talks a lot and asks the same questions repetitively,mostly related to when she will be able to leave, but has redirected from that with firm redirection.

## 2017-11-27 NOTE — BH Assessment (Signed)
[facility] Assessment Progress Note  Per Buford Dresser, DO, this pt continues to require psychiatric hospitalization at this time.  With pt's IVC having expired, Dr Mariea Clonts also finds that pt continues to meet criteria for IVC, which she has initiated.  IVC documents have been faxed to [facility], and at 15:33 Lattie Corns confirms receipt.  He has since faxed Findings and Custody Order to this Probation officer.  At 15:56 I called [employer] and spoke to [employer], who took demographic information, agreeing to dispatch law enforcement to fill out Return of Service.  Law enforcement then presented at [facility], completing Return of Service.  The following facilities have been contacted to seek placement for this pt, with results as noted:  Beds available, information sent, decision pending:  [facility]   At capacity:  [facility], [place] Coordinator [phone]

## 2017-11-27 NOTE — ED Notes (Signed)
Patient at nurse's station speaking in a loud tone.  Patient wants to discharge and feels that she is being held against her will.  Patient requested to see IVC paperwork.  Patient reports that the claims are all "lies."

## 2017-11-28 DIAGNOSIS — F312 Bipolar disorder, current episode manic severe with psychotic features: Secondary | ICD-10-CM | POA: Diagnosis not present

## 2017-11-28 NOTE — BH Assessment (Addendum)
[facility] Assessment Progress Note This Probation officer spoke with patient this date to assess current mental state. Patient is alert and oriented although continues to appear to be confused and disoriented at times. Patient ruminates on various topics unrelated to her immediate care. Patient is unable to be redirected and when asked in reference to S/I states"some things are private." Patient presents with a pleasant affect and speaks in a low soft voice as she interacts with this Probation officer. Patient is observed to be very animated as she describes the AVH at length. Patient states she feels she is ready to be discharged and when informed that she continues to meet inpatient criteria (per Mariea Clonts DO) states she would like to go to [facility]. This Probation officer informed patient of disposition and placement process with patient stating "please just try to get me in there." Patient seemed to process the content of this writer's questions and patient was informed they would be updated on their placement process throughout the day.

## 2017-11-28 NOTE — [facility] Counselor (Signed)
Pt stopped clinician asked when she was allowed to leave [facility]. Clinician expressed that it is at the discretion of the doctor for her to be discharged. Pt reported, she is at there baseline and is ready to leave the hospital. Pt reported, not wanting to be institutionalized.   Vertell Novak, MS, [facility], [facility] Triage Specialist [phone]

## 2017-11-28 NOTE — ED Notes (Addendum)
Up to the bathroom.  Pt is aware that she is not going to be dc'd today, remains calm, cooperative. Pt reports that she wants to be dc'd tomorrow, and has a safe place to go.

## 2017-11-28 NOTE — ED Notes (Signed)
Pt up to the desk, talkative, wanting to call the rehab in [place], to confirm her admission there.  pt reminded that she was not being dc'd . pt redirected back to room w/o difficulty.

## 2017-11-28 NOTE — ED Notes (Addendum)
Pt declined to allow VS to be taken didn't want to be touched.

## 2017-11-28 NOTE — ED Notes (Signed)
Pt up to the desk reports that  She wants to go home to get her things before she goes to the drug rehab in [place].  Pt reports that she has already been approved for admission there.

## 2017-11-28 NOTE — ED Notes (Signed)
Pt was given a snack.

## 2017-11-28 NOTE — ED Notes (Signed)
Pt refused vitals, RN aware  

## 2017-11-28 NOTE — ED Notes (Signed)
Up to the bathroom 

## 2017-11-28 NOTE — Progress Notes (Signed)
CSW followed up on referrals with the following results:  Still reviewing: [facility]  At Capacity: [name] [name]  DeclinedRosana [name]  At capacity: [name] [name], MSW, [place] Work/Disposition Phone: [phone] Fax: [phone]

## 2017-11-28 NOTE — ED Notes (Signed)
Bed: KQA06 Expected date:  Expected time:  Means of arrival:  Comments:

## 2017-11-28 NOTE — ED Notes (Signed)
Patient is calm and cooperative at this time. Plan of care discussed. Encouragement and support provided and safety. Encouragement and support provided and safety maintain. Q 15 min safety checks remain in place and video monitoring.

## 2017-11-28 NOTE — ED Notes (Signed)
Pt moved to room 42, TV in previous room not working.  Pt remains pleasant/cooperative.

## 2017-11-28 NOTE — ED Notes (Signed)
Patients belongings moved from locker #43 to locker #42

## 2017-11-28 NOTE — ED Notes (Signed)
Pt up in the hall, talkative, wanting to go home.  Pt reports that [place] too far for her to go, so now she wants to go to [facility] house in [place].

## 2017-11-28 NOTE — ED Notes (Signed)
In the bathroom

## 2017-11-28 NOTE — ED Notes (Signed)
Up in the hall reading

## 2017-11-29 DIAGNOSIS — F312 Bipolar disorder, current episode manic severe with psychotic features: Secondary | ICD-10-CM | POA: Diagnosis not present

## 2017-11-29 DIAGNOSIS — Z79899 Other long term (current) drug therapy: Secondary | ICD-10-CM

## 2017-11-29 DIAGNOSIS — F23 Brief psychotic disorder: Secondary | ICD-10-CM | POA: Diagnosis not present

## 2017-11-29 DIAGNOSIS — F250 Schizoaffective disorder, bipolar type: Secondary | ICD-10-CM | POA: Diagnosis not present

## 2017-11-29 MED ORDER — DIPHENHYDRAMINE HCL 25 MG PO CAPS: 25.0000 mg | ORAL_CAPSULE | Freq: Every evening | ORAL | 0 refills | Status: DC | PRN

## 2017-11-29 MED ORDER — OLANZAPINE 10 MG PO TABS: 10.0000 mg | ORAL_TABLET | Freq: Two times a day (BID) | ORAL | 0 refills | Status: DC

## 2017-11-29 MED ORDER — FOLIC ACID 1 MG PO TABS: 3.0000 mg | ORAL_TABLET | Freq: Every day | ORAL | 0 refills | Status: DC

## 2017-11-29 MED ORDER — PRENATAL PLUS 27-1 MG PO TABS: 1.0000 | ORAL_TABLET | Freq: Every day | ORAL | 0 refills | Status: DC

## 2017-11-29 NOTE — [facility] Counselor (Signed)
Per Dr. Mariea Clonts and Jinny Blossom, NP patient is psychiatrically cleared for discharge.

## 2017-11-29 NOTE — ED Notes (Signed)
Patient calm and cooperative, bathed this morning and is not coming up to nurse's station and talking non-stop as before.

## 2017-11-29 NOTE — ED Notes (Signed)
Social worker from [employer] working on getting patient a Clinical cytogeneticist for a ride home.
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
## 2017-11-29 NOTE — ED Notes (Signed)
Taxi voucher obtained from [facility], [place].  Patient is going to the [employer] in [place].  Patient left alert and oriented.  No SI, HI, or AVH.

## 2017-11-29 NOTE — ED Notes (Signed)
Patient has been calm and pleasant during this shift.

## 2017-11-29 NOTE — Discharge Instructions (Signed)
Follow-up Information    [facility] Follow up.   Contact information: [address] [place] [address] [phone]   If you are living in the [place] area, follow up at:  [facility] [address] [place] [address] [phone]

## 2018-01-14 ENCOUNTER — Encounter: Payer: Self-pay | Admitting: Adult Health

## 2018-02-05 ENCOUNTER — Emergency Department (HOSPITAL_COMMUNITY)
Admission: EM | Admit: 2018-02-05 | Discharge: 2018-02-05 | Disposition: A | Discharge status: Home / Self Care | Payer: Medicare HMO | Attending: Emergency Medicine | Admitting: Emergency Medicine

## 2018-02-05 DIAGNOSIS — Z046 Encounter for general psychiatric examination, requested by authority: Secondary | ICD-10-CM | POA: Diagnosis not present

## 2018-02-05 DIAGNOSIS — Z5321 Procedure and treatment not carried out due to patient leaving prior to being seen by health care provider: Secondary | ICD-10-CM | POA: Diagnosis not present

## 2018-02-05 NOTE — ED Notes (Signed)
Patient refusing to change into scrubs and grabbed her personal belongings and left ED.

## 2018-02-05 NOTE — ED Triage Notes (Signed)
Patient brought in by [employer] with flight of ideas ranging from wanting to go to [facility] to religiosity.  Patient called 911 and was on the phone with dispatch for awhile per [employer].  She denies any SI or HI at this time, but does want some help and does not want any medications.

## 2018-02-06 DIAGNOSIS — Z3A30 30 weeks gestation of pregnancy: Secondary | ICD-10-CM | POA: Diagnosis not present

## 2018-02-06 DIAGNOSIS — F319 Bipolar disorder, unspecified: Secondary | ICD-10-CM | POA: Diagnosis not present

## 2018-02-06 DIAGNOSIS — F99 Mental disorder, not otherwise specified: Secondary | ICD-10-CM | POA: Diagnosis not present

## 2018-02-06 DIAGNOSIS — O26893 Other specified pregnancy related conditions, third trimester: Secondary | ICD-10-CM | POA: Diagnosis not present

## 2018-02-06 DIAGNOSIS — R4586 Emotional lability: Secondary | ICD-10-CM | POA: Diagnosis not present

## 2018-02-06 DIAGNOSIS — Z590 Homelessness: Secondary | ICD-10-CM | POA: Diagnosis not present

## 2018-02-06 DIAGNOSIS — Z91411 Personal history of adult psychological abuse: Secondary | ICD-10-CM | POA: Diagnosis not present

## 2018-02-06 DIAGNOSIS — O99343 Other mental disorders complicating pregnancy, third trimester: Secondary | ICD-10-CM | POA: Diagnosis not present

## 2018-02-06 DIAGNOSIS — Z91410 Personal history of adult physical and sexual abuse: Secondary | ICD-10-CM | POA: Diagnosis not present

## 2018-02-06 DIAGNOSIS — Z781 Physical restraint status: Secondary | ICD-10-CM | POA: Diagnosis not present

## 2018-02-06 DIAGNOSIS — F4310 Post-traumatic stress disorder, unspecified: Secondary | ICD-10-CM | POA: Diagnosis not present

## 2018-02-16 DIAGNOSIS — O99332 Smoking (tobacco) complicating pregnancy, second trimester: Secondary | ICD-10-CM | POA: Diagnosis not present

## 2018-02-16 DIAGNOSIS — O99342 Other mental disorders complicating pregnancy, second trimester: Secondary | ICD-10-CM | POA: Diagnosis not present

## 2018-02-16 DIAGNOSIS — R462 Strange and inexplicable behavior: Secondary | ICD-10-CM | POA: Diagnosis not present

## 2018-02-16 DIAGNOSIS — F39 Unspecified mood [affective] disorder: Secondary | ICD-10-CM | POA: Insufficient documentation

## 2018-02-16 DIAGNOSIS — O99343 Other mental disorders complicating pregnancy, third trimester: Secondary | ICD-10-CM | POA: Diagnosis not present

## 2018-02-16 DIAGNOSIS — Z590 Homelessness: Secondary | ICD-10-CM | POA: Diagnosis not present

## 2018-02-16 DIAGNOSIS — Z3A30 30 weeks gestation of pregnancy: Secondary | ICD-10-CM | POA: Diagnosis not present

## 2018-02-16 DIAGNOSIS — F4310 Post-traumatic stress disorder, unspecified: Secondary | ICD-10-CM | POA: Diagnosis not present

## 2018-02-16 DIAGNOSIS — E119 Type 2 diabetes mellitus without complications: Secondary | ICD-10-CM | POA: Diagnosis not present

## 2018-02-16 DIAGNOSIS — R456 Violent behavior: Secondary | ICD-10-CM | POA: Diagnosis not present

## 2018-02-16 DIAGNOSIS — F312 Bipolar disorder, current episode manic severe with psychotic features: Secondary | ICD-10-CM | POA: Diagnosis not present

## 2018-02-16 DIAGNOSIS — Z331 Pregnant state, incidental: Secondary | ICD-10-CM | POA: Diagnosis not present

## 2018-02-16 DIAGNOSIS — Z781 Physical restraint status: Secondary | ICD-10-CM | POA: Diagnosis not present

## 2018-02-16 DIAGNOSIS — Z888 Allergy status to other drugs, medicaments and biological substances status: Secondary | ICD-10-CM | POA: Diagnosis not present

## 2018-02-16 DIAGNOSIS — F17200 Nicotine dependence, unspecified, uncomplicated: Secondary | ICD-10-CM | POA: Diagnosis not present

## 2018-02-16 DIAGNOSIS — F319 Bipolar disorder, unspecified: Secondary | ICD-10-CM | POA: Diagnosis not present

## 2018-02-16 DIAGNOSIS — O24112 Pre-existing diabetes mellitus, type 2, in pregnancy, second trimester: Secondary | ICD-10-CM | POA: Diagnosis not present

## 2018-03-02 DIAGNOSIS — O99343 Other mental disorders complicating pregnancy, third trimester: Secondary | ICD-10-CM | POA: Diagnosis not present

## 2018-03-02 DIAGNOSIS — F23 Brief psychotic disorder: Secondary | ICD-10-CM | POA: Diagnosis not present

## 2018-03-02 DIAGNOSIS — F309 Manic episode, unspecified: Secondary | ICD-10-CM | POA: Diagnosis not present

## 2018-03-02 DIAGNOSIS — Z590 Homelessness: Secondary | ICD-10-CM | POA: Diagnosis not present

## 2018-03-02 DIAGNOSIS — Z9114 Patient's other noncompliance with medication regimen: Secondary | ICD-10-CM | POA: Diagnosis not present

## 2018-03-02 DIAGNOSIS — Z781 Physical restraint status: Secondary | ICD-10-CM | POA: Diagnosis not present

## 2018-03-02 DIAGNOSIS — Z79899 Other long term (current) drug therapy: Secondary | ICD-10-CM | POA: Diagnosis not present

## 2018-03-02 DIAGNOSIS — R000 Tachycardia, unspecified: Secondary | ICD-10-CM | POA: Diagnosis not present

## 2018-03-02 DIAGNOSIS — R456 Violent behavior: Secondary | ICD-10-CM | POA: Diagnosis not present

## 2018-03-02 DIAGNOSIS — Z3A31 31 weeks gestation of pregnancy: Secondary | ICD-10-CM | POA: Diagnosis not present

## 2018-03-11 DIAGNOSIS — D252 Subserosal leiomyoma of uterus: Secondary | ICD-10-CM | POA: Diagnosis not present

## 2018-03-11 DIAGNOSIS — O99820 Streptococcus B carrier state complicating pregnancy: Secondary | ICD-10-CM | POA: Diagnosis not present

## 2018-03-11 DIAGNOSIS — Z3A35 35 weeks gestation of pregnancy: Secondary | ICD-10-CM | POA: Diagnosis not present

## 2018-03-11 DIAGNOSIS — K5900 Constipation, unspecified: Secondary | ICD-10-CM | POA: Diagnosis not present

## 2018-03-11 DIAGNOSIS — E669 Obesity, unspecified: Secondary | ICD-10-CM | POA: Diagnosis not present

## 2018-03-11 DIAGNOSIS — O99824 Streptococcus B carrier state complicating childbirth: Secondary | ICD-10-CM | POA: Diagnosis not present

## 2018-03-11 DIAGNOSIS — F319 Bipolar disorder, unspecified: Secondary | ICD-10-CM | POA: Diagnosis not present

## 2018-03-11 DIAGNOSIS — O3483 Maternal care for other abnormalities of pelvic organs, third trimester: Secondary | ICD-10-CM | POA: Diagnosis not present

## 2018-03-11 DIAGNOSIS — O1002 Pre-existing essential hypertension complicating childbirth: Secondary | ICD-10-CM | POA: Diagnosis not present

## 2018-03-11 DIAGNOSIS — O99344 Other mental disorders complicating childbirth: Secondary | ICD-10-CM | POA: Diagnosis not present

## 2018-03-11 DIAGNOSIS — O99213 Obesity complicating pregnancy, third trimester: Secondary | ICD-10-CM | POA: Diagnosis not present

## 2018-03-11 DIAGNOSIS — F312 Bipolar disorder, current episode manic severe with psychotic features: Secondary | ICD-10-CM | POA: Diagnosis not present

## 2018-03-11 DIAGNOSIS — O09523 Supervision of elderly multigravida, third trimester: Secondary | ICD-10-CM | POA: Diagnosis not present

## 2018-03-11 DIAGNOSIS — O133 Gestational [pregnancy-induced] hypertension without significant proteinuria, third trimester: Secondary | ICD-10-CM | POA: Diagnosis not present

## 2018-03-11 DIAGNOSIS — Z9114 Patient's other noncompliance with medication regimen: Secondary | ICD-10-CM | POA: Diagnosis not present

## 2018-03-11 DIAGNOSIS — N83292 Other ovarian cyst, left side: Secondary | ICD-10-CM | POA: Diagnosis not present

## 2018-03-11 DIAGNOSIS — R456 Violent behavior: Secondary | ICD-10-CM | POA: Diagnosis not present

## 2018-03-11 DIAGNOSIS — O99343 Other mental disorders complicating pregnancy, third trimester: Secondary | ICD-10-CM | POA: Diagnosis not present

## 2018-03-11 DIAGNOSIS — Z79899 Other long term (current) drug therapy: Secondary | ICD-10-CM | POA: Diagnosis not present

## 2018-03-11 DIAGNOSIS — F23 Brief psychotic disorder: Secondary | ICD-10-CM | POA: Diagnosis not present

## 2018-03-11 DIAGNOSIS — Z781 Physical restraint status: Secondary | ICD-10-CM | POA: Diagnosis not present

## 2018-03-11 DIAGNOSIS — Z590 Homelessness: Secondary | ICD-10-CM | POA: Diagnosis not present

## 2018-03-11 DIAGNOSIS — G4700 Insomnia, unspecified: Secondary | ICD-10-CM | POA: Diagnosis not present

## 2018-03-11 DIAGNOSIS — Z3A31 31 weeks gestation of pregnancy: Secondary | ICD-10-CM | POA: Diagnosis not present

## 2018-03-11 DIAGNOSIS — Z3A32 32 weeks gestation of pregnancy: Secondary | ICD-10-CM | POA: Diagnosis not present

## 2018-03-11 DIAGNOSIS — O3413 Maternal care for benign tumor of corpus uteri, third trimester: Secondary | ICD-10-CM | POA: Diagnosis not present

## 2018-03-11 DIAGNOSIS — Z23 Encounter for immunization: Secondary | ICD-10-CM | POA: Diagnosis not present

## 2018-03-11 DIAGNOSIS — Z3A37 37 weeks gestation of pregnancy: Secondary | ICD-10-CM | POA: Diagnosis not present

## 2018-03-11 DIAGNOSIS — I10 Essential (primary) hypertension: Secondary | ICD-10-CM | POA: Diagnosis not present

## 2018-03-11 DIAGNOSIS — F29 Unspecified psychosis not due to a substance or known physiological condition: Secondary | ICD-10-CM | POA: Diagnosis not present

## 2018-03-11 DIAGNOSIS — O10013 Pre-existing essential hypertension complicating pregnancy, third trimester: Secondary | ICD-10-CM | POA: Diagnosis not present

## 2018-03-11 DIAGNOSIS — Z3A34 34 weeks gestation of pregnancy: Secondary | ICD-10-CM | POA: Diagnosis not present

## 2018-03-11 DIAGNOSIS — F3110 Bipolar disorder, current episode manic without psychotic features, unspecified: Secondary | ICD-10-CM | POA: Diagnosis not present

## 2018-03-19 IMAGING — DX DG CHEST 2V
2 series · 2 of 2 positions shown · non-contrast
Comparison: 06/13/2015

CLINICAL DATA: Cough x1 month, fever

EXAM:
CHEST  2 VIEW

[chest lat]
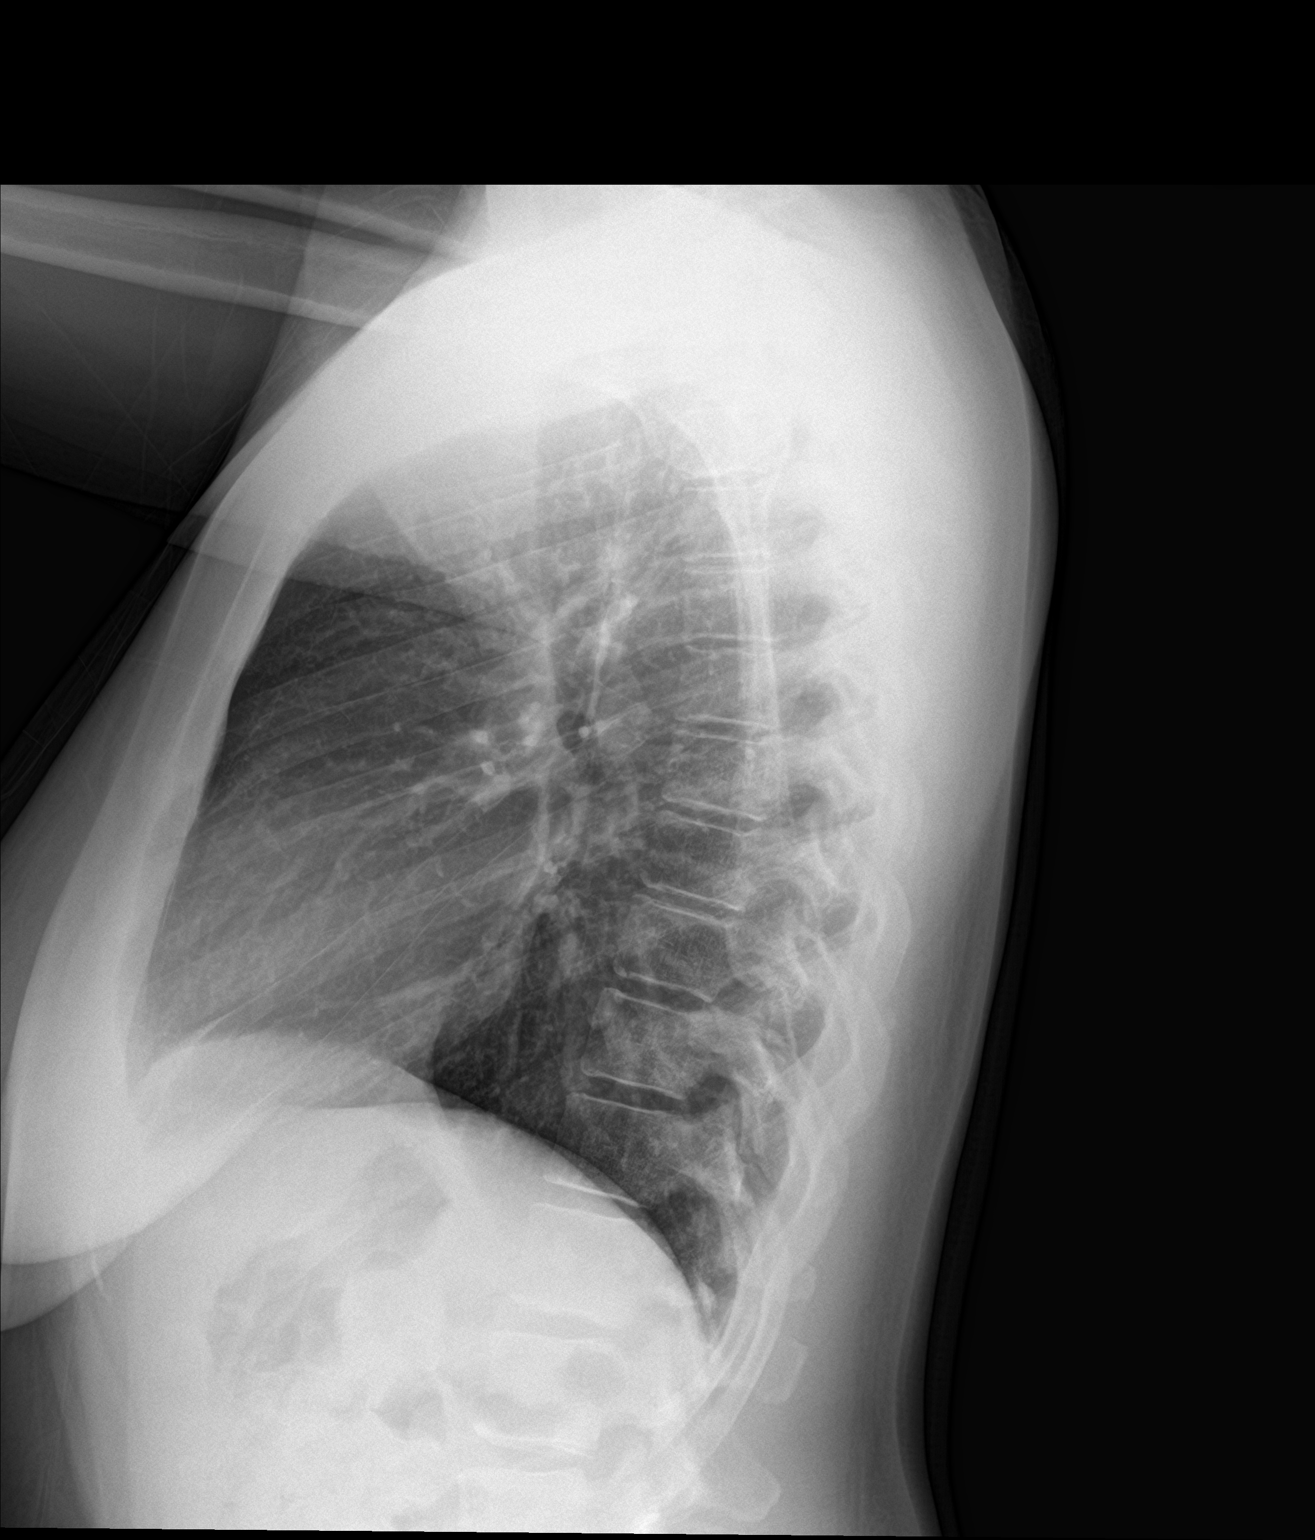

[chest pa]
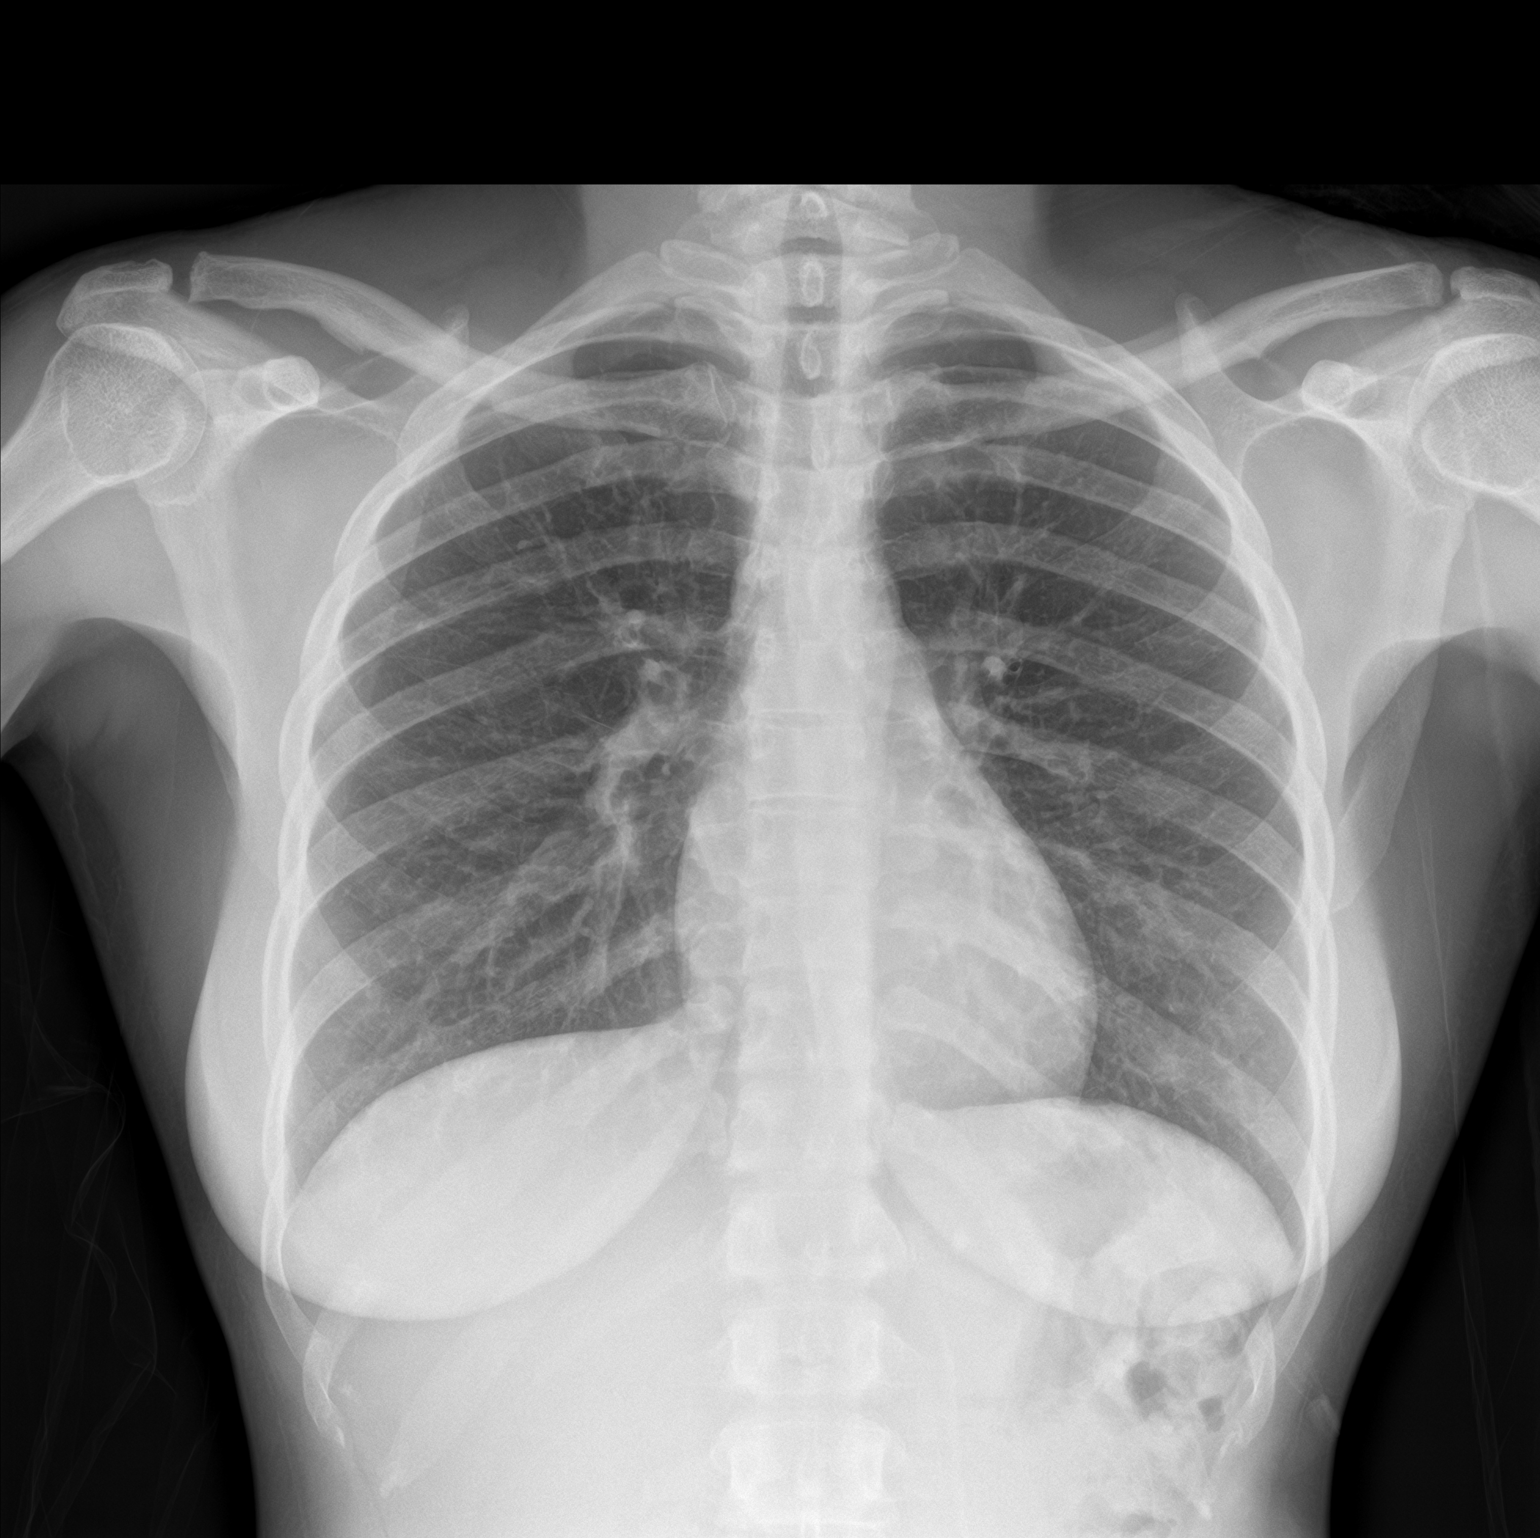

[2 of 2 positions shown; findings below may reference images not displayed]

FINDINGS: Lungs are clear.  No pleural effusion or pneumothorax.

The heart is normal in size.

Visualized osseous structures are within normal limits.
IMPRESSION: Normal chest radiographs.

## 2018-04-12 DIAGNOSIS — O99213 Obesity complicating pregnancy, third trimester: Secondary | ICD-10-CM | POA: Diagnosis not present

## 2018-04-12 DIAGNOSIS — E669 Obesity, unspecified: Secondary | ICD-10-CM | POA: Diagnosis not present

## 2018-04-12 DIAGNOSIS — O10013 Pre-existing essential hypertension complicating pregnancy, third trimester: Secondary | ICD-10-CM | POA: Diagnosis not present

## 2018-04-12 DIAGNOSIS — O99824 Streptococcus B carrier state complicating childbirth: Secondary | ICD-10-CM | POA: Diagnosis not present

## 2018-04-12 DIAGNOSIS — O99343 Other mental disorders complicating pregnancy, third trimester: Secondary | ICD-10-CM | POA: Diagnosis not present

## 2018-04-12 DIAGNOSIS — F23 Brief psychotic disorder: Secondary | ICD-10-CM | POA: Diagnosis not present

## 2018-04-12 DIAGNOSIS — D252 Subserosal leiomyoma of uterus: Secondary | ICD-10-CM | POA: Diagnosis not present

## 2018-04-12 DIAGNOSIS — O99344 Other mental disorders complicating childbirth: Secondary | ICD-10-CM | POA: Diagnosis not present

## 2018-04-12 DIAGNOSIS — G4700 Insomnia, unspecified: Secondary | ICD-10-CM | POA: Diagnosis not present

## 2018-04-12 DIAGNOSIS — F312 Bipolar disorder, current episode manic severe with psychotic features: Secondary | ICD-10-CM | POA: Diagnosis not present

## 2018-04-12 DIAGNOSIS — O139 Gestational [pregnancy-induced] hypertension without significant proteinuria, unspecified trimester: Secondary | ICD-10-CM | POA: Diagnosis not present

## 2018-04-12 DIAGNOSIS — O1002 Pre-existing essential hypertension complicating childbirth: Secondary | ICD-10-CM | POA: Diagnosis not present

## 2018-04-12 DIAGNOSIS — O99820 Streptococcus B carrier state complicating pregnancy: Secondary | ICD-10-CM | POA: Diagnosis not present

## 2018-04-12 DIAGNOSIS — F29 Unspecified psychosis not due to a substance or known physiological condition: Secondary | ICD-10-CM | POA: Diagnosis not present

## 2018-04-12 DIAGNOSIS — O3413 Maternal care for benign tumor of corpus uteri, third trimester: Secondary | ICD-10-CM | POA: Diagnosis not present

## 2018-04-12 DIAGNOSIS — F319 Bipolar disorder, unspecified: Secondary | ICD-10-CM | POA: Diagnosis not present

## 2018-04-12 DIAGNOSIS — O133 Gestational [pregnancy-induced] hypertension without significant proteinuria, third trimester: Secondary | ICD-10-CM | POA: Diagnosis not present

## 2018-04-12 DIAGNOSIS — O09523 Supervision of elderly multigravida, third trimester: Secondary | ICD-10-CM | POA: Diagnosis not present

## 2018-04-12 DIAGNOSIS — Z3A32 32 weeks gestation of pregnancy: Secondary | ICD-10-CM | POA: Diagnosis not present

## 2018-04-12 DIAGNOSIS — Z98891 History of uterine scar from previous surgery: Secondary | ICD-10-CM | POA: Diagnosis not present

## 2018-04-12 DIAGNOSIS — G8918 Other acute postprocedural pain: Secondary | ICD-10-CM | POA: Diagnosis not present

## 2018-04-12 DIAGNOSIS — Z23 Encounter for immunization: Secondary | ICD-10-CM | POA: Diagnosis not present

## 2018-04-12 DIAGNOSIS — Z3A37 37 weeks gestation of pregnancy: Secondary | ICD-10-CM | POA: Diagnosis not present

## 2018-04-12 DIAGNOSIS — N83292 Other ovarian cyst, left side: Secondary | ICD-10-CM | POA: Diagnosis not present

## 2018-04-12 DIAGNOSIS — O3483 Maternal care for other abnormalities of pelvic organs, third trimester: Secondary | ICD-10-CM | POA: Diagnosis not present

## 2018-05-06 DIAGNOSIS — F29 Unspecified psychosis not due to a substance or known physiological condition: Secondary | ICD-10-CM | POA: Diagnosis not present

## 2018-05-06 DIAGNOSIS — F319 Bipolar disorder, unspecified: Secondary | ICD-10-CM | POA: Diagnosis not present

## 2018-05-06 DIAGNOSIS — F23 Brief psychotic disorder: Secondary | ICD-10-CM | POA: Diagnosis not present

## 2018-05-06 DIAGNOSIS — R451 Restlessness and agitation: Secondary | ICD-10-CM | POA: Diagnosis not present

## 2018-05-07 DIAGNOSIS — N898 Other specified noninflammatory disorders of vagina: Secondary | ICD-10-CM | POA: Diagnosis not present

## 2018-05-07 DIAGNOSIS — Z62819 Personal history of unspecified abuse in childhood: Secondary | ICD-10-CM | POA: Diagnosis not present

## 2018-05-07 DIAGNOSIS — O99345 Other mental disorders complicating the puerperium: Secondary | ICD-10-CM | POA: Diagnosis not present

## 2018-05-07 DIAGNOSIS — F23 Brief psychotic disorder: Secondary | ICD-10-CM | POA: Diagnosis not present

## 2018-05-07 DIAGNOSIS — Z915 Personal history of self-harm: Secondary | ICD-10-CM | POA: Diagnosis not present

## 2018-05-07 DIAGNOSIS — F312 Bipolar disorder, current episode manic severe with psychotic features: Secondary | ICD-10-CM | POA: Diagnosis not present

## 2018-05-07 DIAGNOSIS — E119 Type 2 diabetes mellitus without complications: Secondary | ICD-10-CM | POA: Diagnosis not present

## 2018-05-07 DIAGNOSIS — Z9114 Patient's other noncompliance with medication regimen: Secondary | ICD-10-CM | POA: Diagnosis not present

## 2018-05-07 DIAGNOSIS — F319 Bipolar disorder, unspecified: Secondary | ICD-10-CM | POA: Diagnosis not present

## 2018-05-07 DIAGNOSIS — F3113 Bipolar disorder, current episode manic without psychotic features, severe: Secondary | ICD-10-CM | POA: Diagnosis not present

## 2018-05-30 DIAGNOSIS — R7989 Other specified abnormal findings of blood chemistry: Secondary | ICD-10-CM | POA: Diagnosis not present

## 2018-05-30 DIAGNOSIS — Z1159 Encounter for screening for other viral diseases: Secondary | ICD-10-CM | POA: Diagnosis not present

## 2018-05-30 DIAGNOSIS — F23 Brief psychotic disorder: Secondary | ICD-10-CM | POA: Diagnosis not present

## 2018-05-30 DIAGNOSIS — F319 Bipolar disorder, unspecified: Secondary | ICD-10-CM | POA: Diagnosis not present

## 2018-05-31 DIAGNOSIS — F319 Bipolar disorder, unspecified: Secondary | ICD-10-CM | POA: Diagnosis not present

## 2018-05-31 DIAGNOSIS — F312 Bipolar disorder, current episode manic severe with psychotic features: Secondary | ICD-10-CM | POA: Diagnosis not present

## 2018-05-31 DIAGNOSIS — Z9119 Patient's noncompliance with other medical treatment and regimen: Secondary | ICD-10-CM | POA: Diagnosis not present

## 2018-05-31 DIAGNOSIS — Z1159 Encounter for screening for other viral diseases: Secondary | ICD-10-CM | POA: Diagnosis not present

## 2018-05-31 DIAGNOSIS — G4700 Insomnia, unspecified: Secondary | ICD-10-CM | POA: Diagnosis not present

## 2018-05-31 DIAGNOSIS — F23 Brief psychotic disorder: Secondary | ICD-10-CM | POA: Diagnosis not present

## 2018-06-17 DIAGNOSIS — U071 COVID-19: Secondary | ICD-10-CM | POA: Diagnosis not present

## 2018-06-17 DIAGNOSIS — F319 Bipolar disorder, unspecified: Secondary | ICD-10-CM | POA: Diagnosis not present

## 2018-06-17 DIAGNOSIS — F3113 Bipolar disorder, current episode manic without psychotic features, severe: Secondary | ICD-10-CM | POA: Diagnosis not present

## 2018-06-17 DIAGNOSIS — F312 Bipolar disorder, current episode manic severe with psychotic features: Secondary | ICD-10-CM | POA: Diagnosis not present

## 2018-06-17 DIAGNOSIS — R4182 Altered mental status, unspecified: Secondary | ICD-10-CM | POA: Diagnosis not present

## 2018-06-17 DIAGNOSIS — Z9114 Patient's other noncompliance with medication regimen: Secondary | ICD-10-CM | POA: Diagnosis not present

## 2018-07-21 DIAGNOSIS — Z781 Physical restraint status: Secondary | ICD-10-CM | POA: Diagnosis not present

## 2018-07-21 DIAGNOSIS — Z1159 Encounter for screening for other viral diseases: Secondary | ICD-10-CM | POA: Diagnosis not present

## 2018-07-21 DIAGNOSIS — Z9114 Patient's other noncompliance with medication regimen: Secondary | ICD-10-CM | POA: Diagnosis not present

## 2018-07-21 DIAGNOSIS — F209 Schizophrenia, unspecified: Secondary | ICD-10-CM | POA: Diagnosis not present

## 2018-07-21 DIAGNOSIS — F29 Unspecified psychosis not due to a substance or known physiological condition: Secondary | ICD-10-CM | POA: Diagnosis not present

## 2018-07-21 DIAGNOSIS — K5900 Constipation, unspecified: Secondary | ICD-10-CM | POA: Diagnosis not present

## 2018-07-21 DIAGNOSIS — F259 Schizoaffective disorder, unspecified: Secondary | ICD-10-CM | POA: Diagnosis not present

## 2018-07-21 DIAGNOSIS — R45850 Homicidal ideations: Secondary | ICD-10-CM | POA: Diagnosis not present

## 2018-07-21 DIAGNOSIS — R440 Auditory hallucinations: Secondary | ICD-10-CM | POA: Diagnosis not present

## 2018-07-21 DIAGNOSIS — I499 Cardiac arrhythmia, unspecified: Secondary | ICD-10-CM | POA: Diagnosis not present

## 2018-07-21 DIAGNOSIS — F319 Bipolar disorder, unspecified: Secondary | ICD-10-CM | POA: Diagnosis not present

## 2018-07-21 DIAGNOSIS — Z03818 Encounter for observation for suspected exposure to other biological agents ruled out: Secondary | ICD-10-CM | POA: Diagnosis not present

## 2018-08-05 DIAGNOSIS — F99 Mental disorder, not otherwise specified: Secondary | ICD-10-CM | POA: Diagnosis not present

## 2018-08-05 DIAGNOSIS — R451 Restlessness and agitation: Secondary | ICD-10-CM | POA: Diagnosis not present

## 2018-08-05 DIAGNOSIS — Z79899 Other long term (current) drug therapy: Secondary | ICD-10-CM | POA: Diagnosis not present

## 2018-08-05 DIAGNOSIS — R000 Tachycardia, unspecified: Secondary | ICD-10-CM | POA: Diagnosis not present

## 2018-08-19 DIAGNOSIS — F430 Acute stress reaction: Secondary | ICD-10-CM | POA: Diagnosis not present

## 2018-08-19 DIAGNOSIS — Z79899 Other long term (current) drug therapy: Secondary | ICD-10-CM | POA: Diagnosis not present

## 2018-08-19 DIAGNOSIS — F23 Brief psychotic disorder: Secondary | ICD-10-CM | POA: Diagnosis not present

## 2018-08-19 DIAGNOSIS — Z781 Physical restraint status: Secondary | ICD-10-CM | POA: Diagnosis not present

## 2018-08-19 DIAGNOSIS — Z9114 Patient's other noncompliance with medication regimen: Secondary | ICD-10-CM | POA: Diagnosis not present

## 2018-08-19 DIAGNOSIS — Z20828 Contact with and (suspected) exposure to other viral communicable diseases: Secondary | ICD-10-CM | POA: Diagnosis not present

## 2018-08-19 DIAGNOSIS — F312 Bipolar disorder, current episode manic severe with psychotic features: Secondary | ICD-10-CM | POA: Diagnosis not present

## 2018-08-19 DIAGNOSIS — R400 Somnolence: Secondary | ICD-10-CM | POA: Diagnosis not present

## 2018-09-02 DIAGNOSIS — F209 Schizophrenia, unspecified: Secondary | ICD-10-CM | POA: Diagnosis not present

## 2018-09-02 DIAGNOSIS — R4182 Altered mental status, unspecified: Secondary | ICD-10-CM | POA: Diagnosis not present

## 2018-09-03 DIAGNOSIS — R45850 Homicidal ideations: Secondary | ICD-10-CM | POA: Diagnosis not present

## 2018-09-03 DIAGNOSIS — K5900 Constipation, unspecified: Secondary | ICD-10-CM | POA: Diagnosis not present

## 2018-09-03 DIAGNOSIS — F259 Schizoaffective disorder, unspecified: Secondary | ICD-10-CM | POA: Diagnosis not present

## 2018-09-03 DIAGNOSIS — F209 Schizophrenia, unspecified: Secondary | ICD-10-CM | POA: Diagnosis not present

## 2018-09-20 DIAGNOSIS — Z590 Homelessness: Secondary | ICD-10-CM | POA: Diagnosis not present

## 2018-09-20 DIAGNOSIS — Z5329 Procedure and treatment not carried out because of patient's decision for other reasons: Secondary | ICD-10-CM | POA: Diagnosis not present

## 2018-09-20 DIAGNOSIS — F209 Schizophrenia, unspecified: Secondary | ICD-10-CM | POA: Diagnosis not present

## 2018-09-20 DIAGNOSIS — R000 Tachycardia, unspecified: Secondary | ICD-10-CM | POA: Diagnosis not present

## 2018-09-22 DIAGNOSIS — Z022 Encounter for examination for admission to residential institution: Secondary | ICD-10-CM | POA: Diagnosis not present

## 2018-09-22 DIAGNOSIS — Z590 Homelessness: Secondary | ICD-10-CM | POA: Diagnosis not present

## 2018-09-23 DIAGNOSIS — F23 Brief psychotic disorder: Secondary | ICD-10-CM | POA: Diagnosis not present

## 2018-09-23 DIAGNOSIS — Z79899 Other long term (current) drug therapy: Secondary | ICD-10-CM | POA: Diagnosis not present

## 2018-09-23 DIAGNOSIS — F29 Unspecified psychosis not due to a substance or known physiological condition: Secondary | ICD-10-CM | POA: Diagnosis not present

## 2018-09-23 DIAGNOSIS — Z20828 Contact with and (suspected) exposure to other viral communicable diseases: Secondary | ICD-10-CM | POA: Diagnosis not present

## 2018-09-23 DIAGNOSIS — R45850 Homicidal ideations: Secondary | ICD-10-CM | POA: Diagnosis not present

## 2018-09-24 DIAGNOSIS — F250 Schizoaffective disorder, bipolar type: Secondary | ICD-10-CM | POA: Diagnosis not present

## 2018-09-24 DIAGNOSIS — Z20828 Contact with and (suspected) exposure to other viral communicable diseases: Secondary | ICD-10-CM | POA: Diagnosis not present

## 2018-09-24 DIAGNOSIS — R45850 Homicidal ideations: Secondary | ICD-10-CM | POA: Diagnosis not present

## 2018-09-24 DIAGNOSIS — Z79899 Other long term (current) drug therapy: Secondary | ICD-10-CM | POA: Diagnosis not present

## 2018-09-24 DIAGNOSIS — F29 Unspecified psychosis not due to a substance or known physiological condition: Secondary | ICD-10-CM | POA: Diagnosis not present

## 2018-09-24 DIAGNOSIS — F23 Brief psychotic disorder: Secondary | ICD-10-CM | POA: Diagnosis not present

## 2018-11-14 IMAGING — US US OB LIMITED
1 series · 7 of 7 positions shown · non-contrast
Comparison: None.

CLINICAL DATA: Unknown LMP, dating and viability

EXAM:
OBSTETRIC <14 WK ULTRASOUND
TECHNIQUE: Transabdominal ultrasound was performed for evaluation of the
gestation as well as the maternal uterus and adnexal regions.

[Series 1: us ob limited · 7 of 7 slices shown]
[im 1/7]
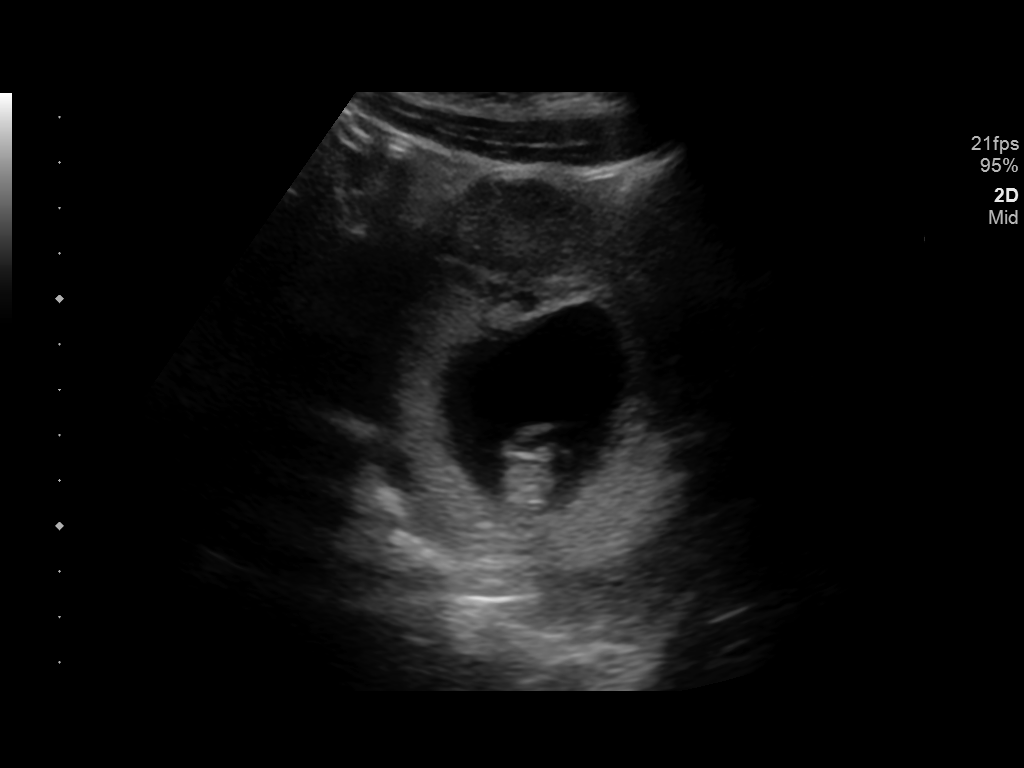
[im 2/7]
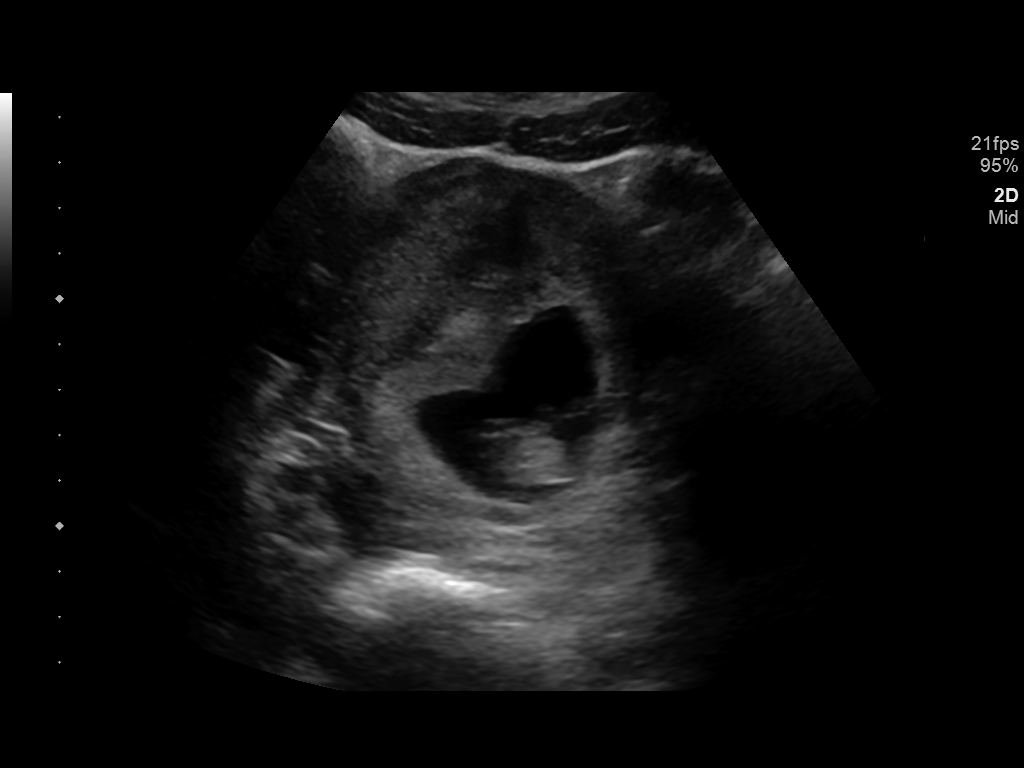
[im 3/7]
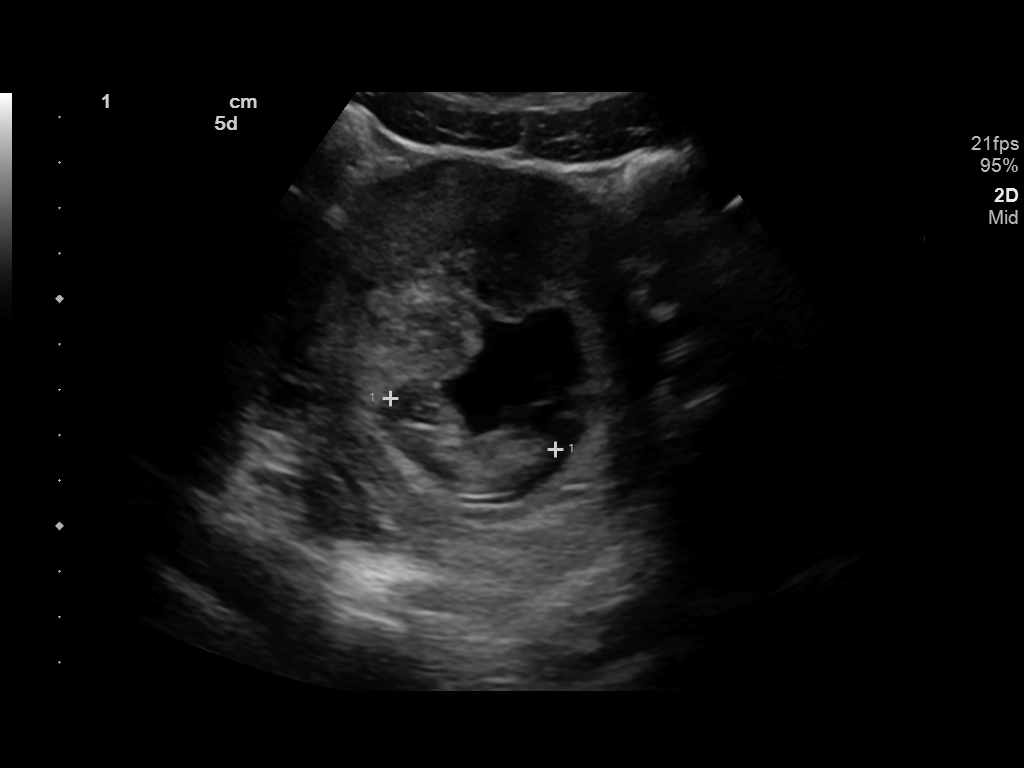
[im 4/7]
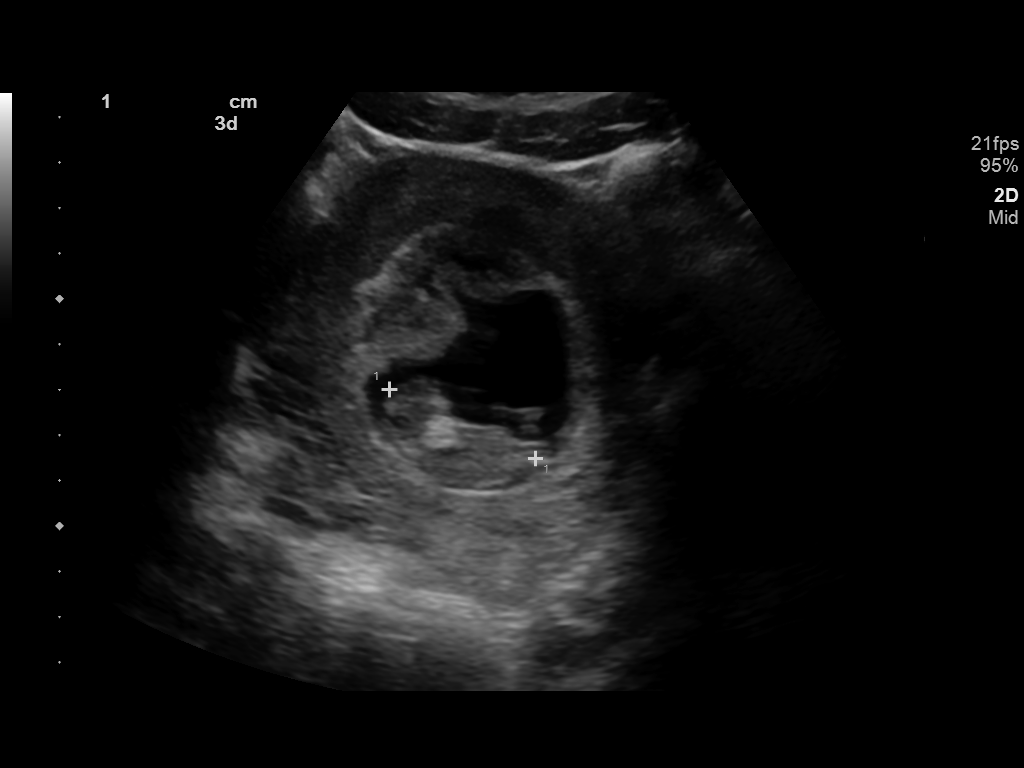
[im 5/7]
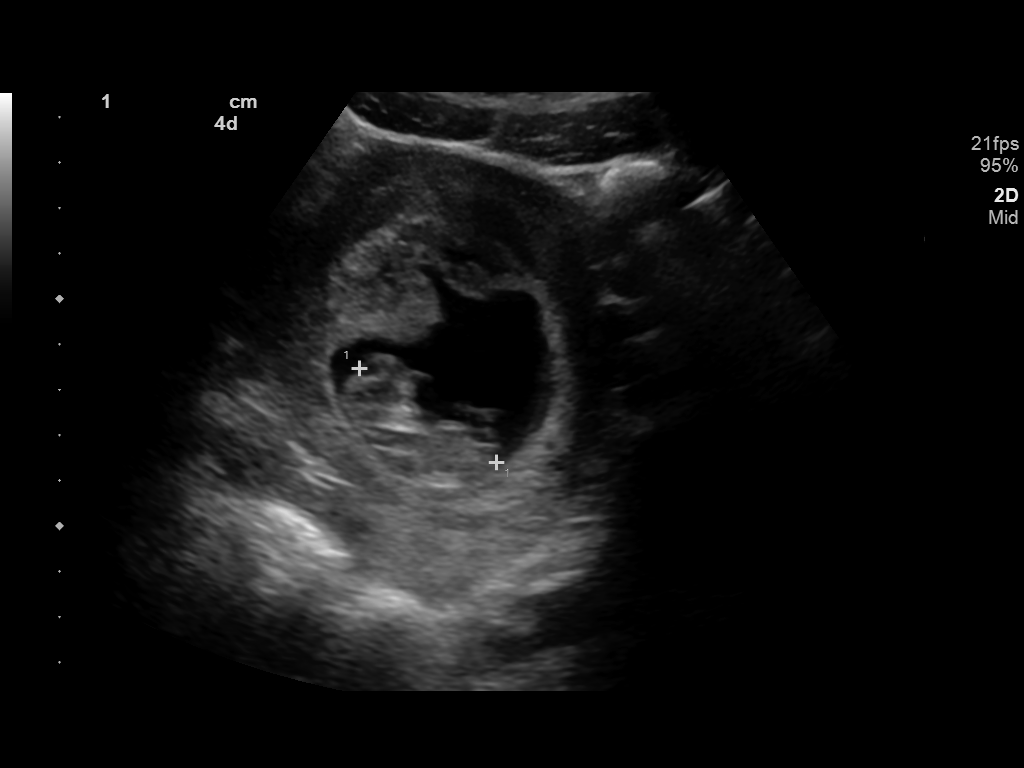
[im 6/7]
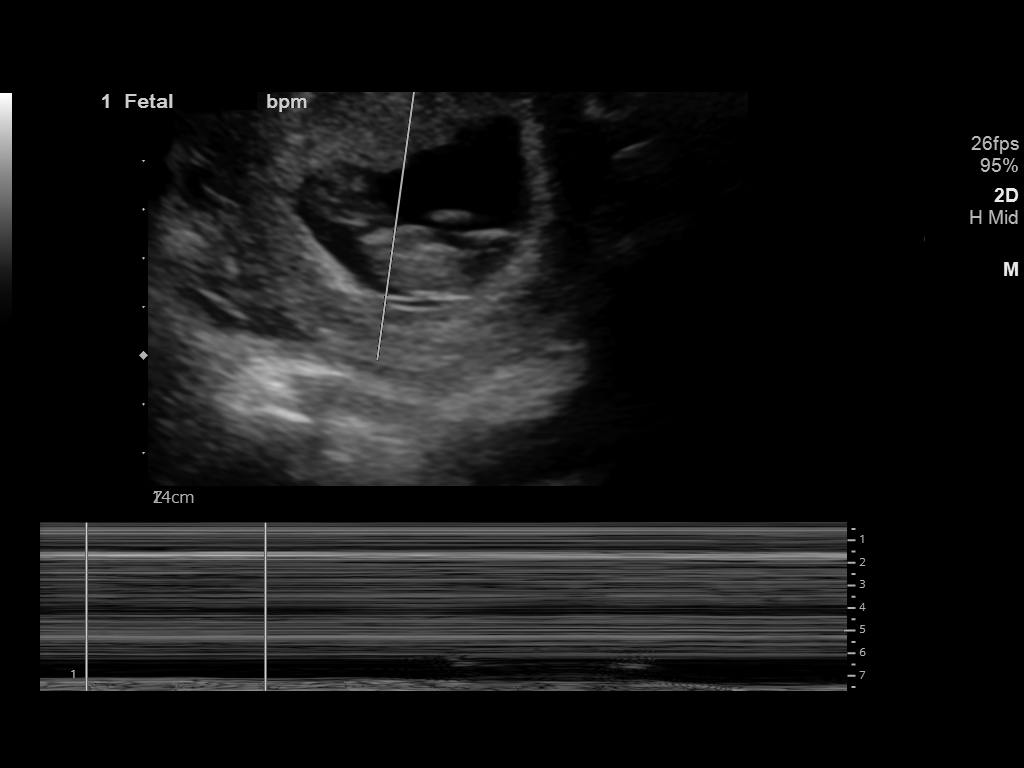
[im 7/7]
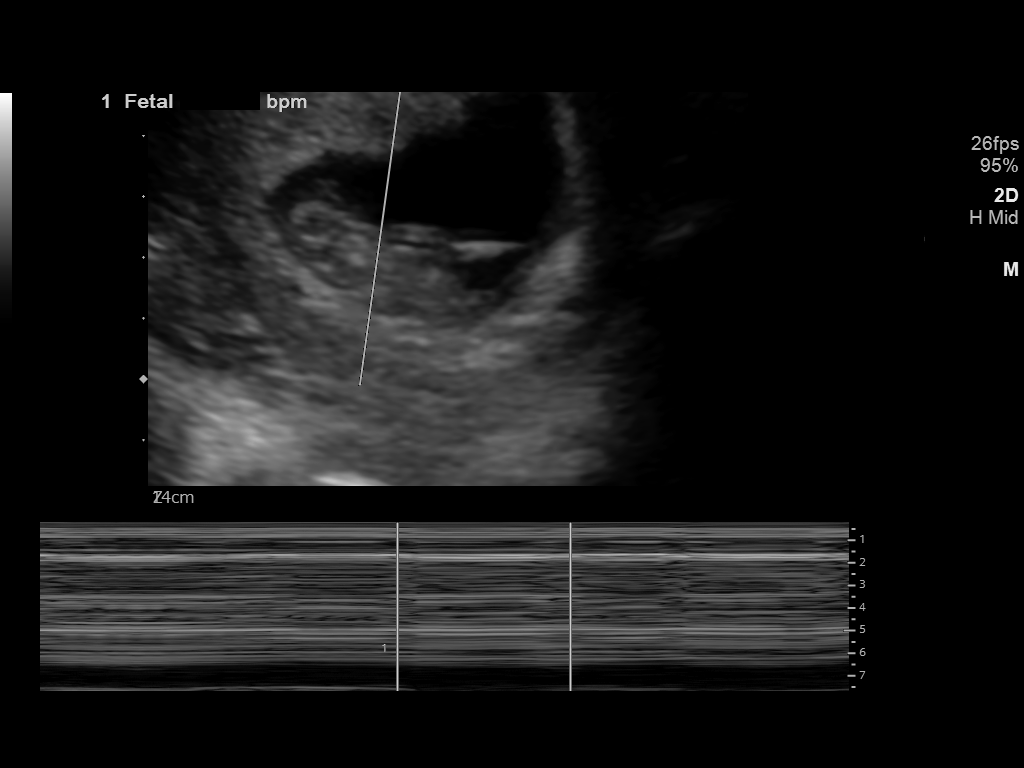

[7 of 7 positions shown; findings below may reference images not displayed]

FINDINGS: Intrauterine gestational sac: Single intrauterine pregnancy

Yolk sac:  Not seen

Embryo:  Visible

Cardiac Activity: Visible

Heart Rate: 166 bpm

CRL: 3.8 cm mm   10 w 5 d                  US EDC: 04/26/2018

Subchorionic hemorrhage:  Not evaluated

Maternal uterus/adnexae: Not evaluated
IMPRESSION: Single IUP with estimated sonographic age of 10 weeks 5 days an EDC
of 04/26/2018. Adnexa were not evaluated.

## 2018-11-25 DIAGNOSIS — Z3A00 Weeks of gestation of pregnancy not specified: Secondary | ICD-10-CM | POA: Diagnosis not present

## 2018-11-25 DIAGNOSIS — O210 Mild hyperemesis gravidarum: Secondary | ICD-10-CM | POA: Diagnosis not present

## 2018-11-25 DIAGNOSIS — Z888 Allergy status to other drugs, medicaments and biological substances status: Secondary | ICD-10-CM | POA: Diagnosis not present

## 2018-11-25 DIAGNOSIS — R110 Nausea: Secondary | ICD-10-CM | POA: Diagnosis not present

## 2018-11-25 DIAGNOSIS — Z331 Pregnant state, incidental: Secondary | ICD-10-CM | POA: Diagnosis not present

## 2019-01-07 NOTE — L&D Delivery Note (Addendum)
Delivery Note [name] [name] is a 39 y.o. G2P1001 at [redacted]w[redacted]d admitted for SOL.   GBS Status: unknown   Maximum Maternal Temperature: 98.6  Labor course: Initial SVE: 6.5. Augmentation with: N/A. She then progressed rapidly to complete.  ROM: 0h 79m with clear fluid  Birth (by Rise Patience, DO w/ my hands guiding hers): At 36 a viable female was delivered via vaginal birth after cesarean (VBAC) (Presentation: LOA). Nuchal cord present: No.  Shoulders and body delivered in usual fashion. Infant placed directly on mom's abdomen for bonding/skin-to-skin, baby dried and stimulated. Cord clamped x 2 after 1 minute and cut by me.  Cord blood collected.  The placenta separated spontaneously and delivered via gentle cord traction.  Pitocin given IM (no IV).  Fundus firm with massage.  Placenta inspected and appears to be intact with a 3 VC.  Placenta/Cord with the following complications: none .  Cord pH: not done Sponge and instrument count were correct x2.  Intrapartum complications:  None Anesthesia:  none Episiotomy: none Lacerations:  Bilateral labial, hemostatic, not repaired Suture Repair: none EBL (mL): 335ml   Infant: APGAR (1 MIN): 9   APGAR (5 MINS): 9   APGAR (10 MINS):    Infant weight: pending  Mom to postpartum.  Baby to Couplet care / Skin to Skin. Placenta to L&D   Plans to Breastfeed Contraception: pills Circumcision: N/A  Note sent to [facility]: [facility] for pp visit.  [place], [facility] 07/10/2019 12:33 PM   Addendum: concern from nurses that pt needs a sitter to ensure safety of baby/herself. Pt pulling covers up over hers and baby's head. Asking if she gives baby water to drink, and if she heats milk up in microwave for baby. Pt has extensive mental health history w/ recent hospitalization for acute psychosis. Doesn't have custody of her other child. Order for safety sitter placed for now, may be more appropriate for baby to be in nursery- nurses discussing  w/ house coverage and SW. SW contacted CPS.  Roma Schanz, CNM, [facility] 07/10/2019 1430

## 2019-07-03 ENCOUNTER — Inpatient Hospital Stay (HOSPITAL_BASED_OUTPATIENT_CLINIC_OR_DEPARTMENT_OTHER): Payer: Medicare Other

## 2019-07-03 ENCOUNTER — Inpatient Hospital Stay (HOSPITAL_COMMUNITY)
Admission: AD | Admit: 2019-07-03 | Discharge: 2019-07-05 | DRG: 833 | Disposition: A | Discharge status: Other Acute Inpatient Hospital | Payer: Medicare Other | Attending: Obstetrics and Gynecology | Admitting: Obstetrics and Gynecology

## 2019-07-03 ENCOUNTER — Encounter (HOSPITAL_COMMUNITY): Payer: Self-pay | Admitting: Obstetrics & Gynecology

## 2019-07-03 DIAGNOSIS — O99213 Obesity complicating pregnancy, third trimester: Secondary | ICD-10-CM | POA: Diagnosis present

## 2019-07-03 DIAGNOSIS — Z3A38 38 weeks gestation of pregnancy: Secondary | ICD-10-CM

## 2019-07-03 DIAGNOSIS — O133 Gestational [pregnancy-induced] hypertension without significant proteinuria, third trimester: Secondary | ICD-10-CM | POA: Diagnosis present

## 2019-07-03 DIAGNOSIS — O26893 Other specified pregnancy related conditions, third trimester: Secondary | ICD-10-CM | POA: Diagnosis present

## 2019-07-03 DIAGNOSIS — O0933 Supervision of pregnancy with insufficient antenatal care, third trimester: Secondary | ICD-10-CM

## 2019-07-03 DIAGNOSIS — O24313 Unspecified pre-existing diabetes mellitus in pregnancy, third trimester: Secondary | ICD-10-CM

## 2019-07-03 DIAGNOSIS — G4700 Insomnia, unspecified: Secondary | ICD-10-CM | POA: Diagnosis present

## 2019-07-03 DIAGNOSIS — F99 Mental disorder, not otherwise specified: Secondary | ICD-10-CM

## 2019-07-03 DIAGNOSIS — F250 Schizoaffective disorder, bipolar type: Secondary | ICD-10-CM | POA: Diagnosis present

## 2019-07-03 DIAGNOSIS — O289 Unspecified abnormal findings on antenatal screening of mother: Secondary | ICD-10-CM | POA: Diagnosis not present

## 2019-07-03 DIAGNOSIS — O288 Other abnormal findings on antenatal screening of mother: Secondary | ICD-10-CM

## 2019-07-03 DIAGNOSIS — F4325 Adjustment disorder with mixed disturbance of emotions and conduct: Secondary | ICD-10-CM | POA: Diagnosis present

## 2019-07-03 DIAGNOSIS — O34219 Maternal care for unspecified type scar from previous cesarean delivery: Secondary | ICD-10-CM | POA: Diagnosis present

## 2019-07-03 DIAGNOSIS — E669 Obesity, unspecified: Secondary | ICD-10-CM | POA: Diagnosis present

## 2019-07-03 DIAGNOSIS — Z9114 Patient's other noncompliance with medication regimen: Secondary | ICD-10-CM

## 2019-07-03 DIAGNOSIS — O09893 Supervision of other high risk pregnancies, third trimester: Secondary | ICD-10-CM

## 2019-07-03 DIAGNOSIS — O09523 Supervision of elderly multigravida, third trimester: Secondary | ICD-10-CM | POA: Diagnosis not present

## 2019-07-03 DIAGNOSIS — O99343 Other mental disorders complicating pregnancy, third trimester: Secondary | ICD-10-CM | POA: Diagnosis present

## 2019-07-03 DIAGNOSIS — Z3687 Encounter for antenatal screening for uncertain dates: Secondary | ICD-10-CM | POA: Diagnosis not present

## 2019-07-03 DIAGNOSIS — F23 Brief psychotic disorder: Secondary | ICD-10-CM | POA: Diagnosis present

## 2019-07-03 DIAGNOSIS — Z98891 History of uterine scar from previous surgery: Secondary | ICD-10-CM

## 2019-07-03 DIAGNOSIS — Z20822 Contact with and (suspected) exposure to covid-19: Secondary | ICD-10-CM | POA: Diagnosis present

## 2019-07-03 DIAGNOSIS — R451 Restlessness and agitation: Secondary | ICD-10-CM | POA: Diagnosis not present

## 2019-07-03 DIAGNOSIS — O99891 Other specified diseases and conditions complicating pregnancy: Secondary | ICD-10-CM | POA: Diagnosis not present

## 2019-07-03 DIAGNOSIS — O99210 Obesity complicating pregnancy, unspecified trimester: Secondary | ICD-10-CM | POA: Diagnosis present

## 2019-07-03 DIAGNOSIS — O0930 Supervision of pregnancy with insufficient antenatal care, unspecified trimester: Secondary | ICD-10-CM

## 2019-07-03 DIAGNOSIS — O0990 Supervision of high risk pregnancy, unspecified, unspecified trimester: Secondary | ICD-10-CM

## 2019-07-03 LAB — COMPREHENSIVE METABOLIC PANEL
ALT: 26 U/L (ref 0–44)
AST: 29 U/L (ref 15–41)
Albumin: 2.6 g/dL — ABNORMAL LOW (ref 3.5–5.0)
Alkaline Phosphatase: 166 U/L — ABNORMAL HIGH (ref 38–126)
Anion gap: 10 (ref 5–15)
BUN: 5 mg/dL — ABNORMAL LOW (ref 6–20)
CO2: 23 mmol/L (ref 22–32)
Calcium: 9.1 mg/dL (ref 8.9–10.3)
Chloride: 104 mmol/L (ref 98–111)
Creatinine, Ser: 0.7 mg/dL (ref 0.44–1.00)
GFR calc Af Amer: >60 mL/min (ref >60)
GFR calc non Af Amer: >60 mL/min (ref >60)
Glucose, Bld: 104 mg/dL — ABNORMAL HIGH (ref 70–99)
Potassium: 3.3 mmol/L — ABNORMAL LOW (ref 3.5–5.1)
Sodium: 137 mmol/L (ref 135–145)
Total Bilirubin: 0.7 mg/dL (ref 0.3–1.2)
Total Protein: 7 g/dL (ref 6.5–8.1)

## 2019-07-03 LAB — DIFFERENTIAL
Abs Immature Granulocytes: 0.01 10*3/uL (ref 0.00–0.07)
Basophils Absolute: 0 10*3/uL (ref 0.0–0.1)
Basophils Relative: 0 %
Eosinophils Absolute: 0 10*3/uL (ref 0.0–0.5)
Eosinophils Relative: 0 %
Immature Granulocytes: 0 %
Lymphocytes Relative: 18 %
Lymphs Abs: 1.3 10*3/uL (ref 0.7–4.0)
Monocytes Absolute: 0.5 10*3/uL (ref 0.1–1.0)
Monocytes Relative: 7 %
Neutro Abs: 5.2 10*3/uL (ref 1.7–7.7)
Neutrophils Relative %: 75 %

## 2019-07-03 LAB — URINALYSIS, ROUTINE W REFLEX MICROSCOPIC
Bacteria, UA: NONE SEEN
Bilirubin Urine: NEGATIVE
Glucose, UA: NEGATIVE mg/dL
Ketones, ur: 20 mg/dL — AB
Nitrite: NEGATIVE
Protein, ur: 30 mg/dL — AB
Specific Gravity, Urine: 1.015 (ref 1.005–1.030)
pH: 5 (ref 5.0–8.0)

## 2019-07-03 LAB — HIV ANTIBODY (ROUTINE TESTING W REFLEX): HIV Screen 4th Generation wRfx: NONREACTIVE

## 2019-07-03 LAB — RAPID URINE DRUG SCREEN, HOSP PERFORMED
Amphetamines: NOT DETECTED
Barbiturates: NOT DETECTED
Benzodiazepines: NOT DETECTED
Cocaine: NOT DETECTED
Opiates: NOT DETECTED
Tetrahydrocannabinol: NOT DETECTED

## 2019-07-03 LAB — CBC
HCT: 33 % — ABNORMAL LOW (ref 36.0–46.0)
Hemoglobin: 10.9 g/dL — ABNORMAL LOW (ref 12.0–15.0)
MCH: 30.7 pg (ref 26.0–34.0)
MCHC: 33 g/dL (ref 30.0–36.0)
MCV: 93 fL (ref 80.0–100.0)
Platelets: 212 10*3/uL (ref 150–400)
RBC: 3.55 MIL/uL — ABNORMAL LOW (ref 3.87–5.11)
RDW: 13.5 % (ref 11.5–15.5)
WBC: 6.9 10*3/uL (ref 4.0–10.5)
nRBC: 0 % (ref 0.0–0.2)

## 2019-07-03 LAB — WET PREP, GENITAL
Sperm: NONE SEEN
Trich, Wet Prep: NONE SEEN
Yeast Wet Prep HPF POC: NONE SEEN

## 2019-07-03 LAB — SARS CORONAVIRUS 2 BY RT PCR (HOSPITAL ORDER, PERFORMED IN ~~LOC~~ HOSPITAL LAB): SARS Coronavirus 2: NEGATIVE

## 2019-07-03 LAB — HEPATITIS B SURFACE ANTIGEN: Hepatitis B Surface Ag: NONREACTIVE

## 2019-07-03 LAB — GLUCOSE, CAPILLARY: Glucose-Capillary: 59 mg/dL — ABNORMAL LOW (ref 70–99)

## 2019-07-03 MED ORDER — COMPLETENATE 29-1 MG PO CHEW
1.0000 | CHEWABLE_TABLET | Freq: Every day | ORAL | Status: DC
  Administered 2019-07-03 – 2019-07-05 (×3): 1 via ORAL
  Filled 2019-07-03 (×3): qty 1
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
## 2019-07-03 NOTE — Progress Notes (Signed)
Spoke with [facility] regarding placement options. [facility] AC is reviewing the chart and will call back.
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
## 2019-07-03 NOTE — Progress Notes (Signed)
Pt refused GBS swab.

## 2019-07-03 NOTE — BH Assessment (Signed)
Per Waylan Boga, DNP, inpatient  Psychiatric treatment is recommended. Tosin Olasunkmi, AC at [facility], confirmed adult unit is at capacity. TTS will contact other facilities for placement.   Evelena Peat, [facility], [facility] Triage Specialist [phone]

## 2019-07-03 NOTE — Progress Notes (Signed)
Pt is pacing the room and unwilling to get back in bed for monitoring.  Monitors applied while pt standing at [facility].  Pt has now removed wig and is washing her hair in the sink.

## 2019-07-03 NOTE — Progress Notes (Signed)
Pt declined lab draw

## 2019-07-03 NOTE — Progress Notes (Addendum)
[facility] reports no appropriate beds available.  She will consult with TTS to seek placement.

## 2019-07-03 NOTE — Progress Notes (Addendum)
Pt speaking to [facility] provider.  Declines FHR monitoring at this time.  Had become fixated on the need for prenatal vitamin.  Still having brief moments of clarity with disorganized thought throughout.

## 2019-07-03 NOTE — Progress Notes (Signed)
Pt states that she had diabetes.  CBG done per pt request and found to be 59.   IV fluid offered to help correct sugar.  Pt advised to be NPO at this time while fetal status is established.  Pt declines IV stating that she does not have diabetes and that any time she has ever had low sugar she just drank OJ.    Pt reassure that food would be helpful but because of the possibility of repeat c-section it is advisable that she not eat anything.  Pt refuses IV fluid and any further "needle treatment".  Pt begins to become agitated and more disorganized in though.  Pt requesting socks.  Socks given

## 2019-07-03 NOTE — Progress Notes (Signed)
BH consult done vis TTS.

## 2019-07-04 ENCOUNTER — Other Ambulatory Visit: Payer: Self-pay | Admitting: Obstetrics and Gynecology

## 2019-07-04 ENCOUNTER — Encounter (HOSPITAL_COMMUNITY): Payer: Self-pay | Admitting: Obstetrics and Gynecology

## 2019-07-04 ENCOUNTER — Other Ambulatory Visit: Payer: Self-pay

## 2019-07-04 DIAGNOSIS — Z98891 History of uterine scar from previous surgery: Secondary | ICD-10-CM

## 2019-07-04 DIAGNOSIS — F250 Schizoaffective disorder, bipolar type: Secondary | ICD-10-CM

## 2019-07-04 DIAGNOSIS — O09893 Supervision of other high risk pregnancies, third trimester: Secondary | ICD-10-CM

## 2019-07-04 DIAGNOSIS — O09523 Supervision of elderly multigravida, third trimester: Secondary | ICD-10-CM | POA: Diagnosis present

## 2019-07-04 DIAGNOSIS — E669 Obesity, unspecified: Secondary | ICD-10-CM | POA: Diagnosis present

## 2019-07-04 DIAGNOSIS — O0990 Supervision of high risk pregnancy, unspecified, unspecified trimester: Secondary | ICD-10-CM

## 2019-07-04 DIAGNOSIS — O0933 Supervision of pregnancy with insufficient antenatal care, third trimester: Secondary | ICD-10-CM

## 2019-07-04 DIAGNOSIS — O99210 Obesity complicating pregnancy, unspecified trimester: Secondary | ICD-10-CM | POA: Diagnosis present

## 2019-07-04 LAB — PROTEIN / CREATININE RATIO, URINE
Creatinine, Urine: 62.69 mg/dL
Protein Creatinine Ratio: 0.13 mg/mg{Cre} (ref 0.00–0.15)
Total Protein, Urine: 8 mg/dL

## 2019-07-04 LAB — RPR: RPR Ser Ql: NONREACTIVE

## 2019-07-04 LAB — HEMOGLOBIN A1C
Hgb A1c MFr Bld: 5.7 % — ABNORMAL HIGH (ref 4.8–5.6)
Mean Plasma Glucose: 116.89 mg/dL

## 2019-07-04 LAB — RUBELLA SCREEN: Rubella: 2.1 index (ref >0.99)

## 2019-07-04 MED ORDER — ENOXAPARIN SODIUM 40 MG/0.4ML ~~LOC~~ SOLN
40.0000 mg | SUBCUTANEOUS | Status: DC
  Administered 2019-07-04: 40 mg via SUBCUTANEOUS
  Filled 2019-07-04: qty 0.4

## 2019-07-04 MED ORDER — PRENATAL MULTIVITAMIN CH: 1.0000 | ORAL_TABLET | Freq: Every day | ORAL | Status: DC

## 2019-07-04 MED ORDER — DOCUSATE SODIUM 100 MG PO CAPS: 100.0000 mg | ORAL_CAPSULE | Freq: Every day | ORAL | Status: DC

## 2019-07-04 MED ORDER — POTASSIUM CHLORIDE CRYS ER 20 MEQ PO TBCR
20.0000 meq | EXTENDED_RELEASE_TABLET | Freq: Two times a day (BID) | ORAL | Status: DC
  Administered 2019-07-04 – 2019-07-05 (×3): 20 meq via ORAL
  Filled 2019-07-04 (×3): qty 1

## 2019-07-04 MED ORDER — AVEENO MOISTURIZING EX BAR
CHEWABLE_BAR | CUTANEOUS | Status: DC | PRN
  Filled 2019-07-04: qty 1

## 2019-07-04 MED ORDER — DOCUSATE SODIUM 100 MG PO CAPS: 100.0000 mg | ORAL_CAPSULE | Freq: Two times a day (BID) | ORAL | Status: DC | PRN

## 2019-07-04 MED ORDER — CALCIUM CARBONATE ANTACID 500 MG PO CHEW: 2.0000 | CHEWABLE_TABLET | ORAL | Status: DC | PRN

## 2019-07-04 MED ORDER — CALAMINE EX LOTN
TOPICAL_LOTION | CUTANEOUS | Status: DC | PRN
  Filled 2019-07-04: qty 177

## 2019-07-04 MED ORDER — ACETAMINOPHEN 325 MG PO TABS
650.0000 mg | ORAL_TABLET | ORAL | Status: DC | PRN
  Filled 2019-07-04: qty 2

## 2019-07-04 MED ORDER — AVEENO SOOTHING BATH TREATMENT EX PACK
1.0000 | PACK | Freq: Every day | CUTANEOUS | Status: DC | PRN
  Administered 2019-07-04 – 2019-07-05 (×2): 1 via TOPICAL
  Filled 2019-07-04 (×3): qty 1

## 2019-07-04 MED ORDER — HYDROXYZINE HCL 10 MG PO TABS
10.0000 mg | ORAL_TABLET | Freq: Three times a day (TID) | ORAL | Status: DC | PRN
  Administered 2019-07-04 – 2019-07-05 (×3): 10 mg via ORAL
  Filled 2019-07-04 (×5): qty 1

## 2019-07-04 MED ORDER — ZOLPIDEM TARTRATE 5 MG PO TABS: 5.0000 mg | ORAL_TABLET | Freq: Every evening | ORAL | Status: DC | PRN

## 2019-07-04 NOTE — Progress Notes (Signed)
Patient is refusing vitals at this time. Will attempt to obtain them again in a few hours.
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
## 2019-07-04 NOTE — Progress Notes (Signed)
Patient seems agitated at this time, patient refuses to be monitored.

## 2019-07-04 NOTE — Progress Notes (Signed)
OB Note  I introduced myself to patient.   From what I can glean from her prior OB history, she states she had a c-section in April 2020 at term. She states they took her baby away from her. She declines to say what hospital she delivered at because she didn't like the hospital.  Patient has flight of ideas and states the only person she talks to is God and that her itching is due to bugs, plants.   Gen: patient covered in calamine lotion on nec, arms, legs Ext: 1 to 2+ LE edema b/l , symmetric Patient declines physical exam  I d/w her re: the plan and I recommended lovenox for VTE prophylaxis which she appeared to be amenable to. She declined blood testing for bile acids to see if her itching could be pregnancy related.  She would like to deliver in 3 days but I told her we can talk to her about this more later once we have her work up more complete. She declined to let me see her abdomen but sign out is that she does have a scar on her belly that is what a c-section incision would look like. If she has no other pregnancy issues, then delivery at 39wks, which would be July 4 would be indicated. She does want a repeat c-section. Based on her current state, I'm not sure if I can properly consent her for a tubal ligation, so I didn't even bring this up with her. I can d/w psych re: this  She does need a GTT. a1c ordered and will talk to her more about this later  Durene Romans MD Attending Center for [facility]) GYN Consult Phone: [phone] (M-F, [phone]) & [phone] (Off hours, weekends, holidays)

## 2019-07-04 NOTE — Progress Notes (Signed)
Explained to patient that we needed to apply the monitors for the FHR at some point during this shift. Patient refuses at this time.

## 2019-07-04 NOTE — Progress Notes (Signed)
FACULTY PRACTICE ANTEPARTUM(COMPREHENSIVE) NOTE  [name] [name] is a 39 y.o. G2P1000 at [redacted]w[redacted]d by best clinical estimate, which is late u/s yesterday who is admitted for Involuntary commitment, inappropriate behavior.   Fetal presentation is cephalic. She has scratched herself , on right thigh, and is pleased that it will leave a scar that will look like a heart. Length of Stay:  1  Days  Subjective: Patient denies headache scotoma ruq pain.  Patient reports the fetal movement as hasn't been paying attention. Patient reports uterine contraction  activity as none. Patient reports  vaginal bleeding as none. Patient describes fluid per vagina as None.  Vitals:  Blood pressure (!) 141/84, pulse (!) 108, temperature 97.9 F (36.6 C), temperature source Oral, resp. rate 20, height 5\' 4"  (1.626 m), weight 92.9 kg, SpO2 100 %. Physical Examination:  General appearance - alert, well appearing, and in no distress and oriented to person, place, and time spent time in prolonged shower, coated lower extremities in Calamine lotion, feel well now no itching. Heart - normal rate and regular rhythm Abdomen - soft, nontender, nondistended Fundal Height:  size equals dates Cervical Exam: Not evaluated. and found to be not evaluated/ Extremities: extremities normal, atraumatic, no cyanosis or edema and Homans sign is negative, no sign of DVT with DTRs 0-1+ bilaterally Membranes:intact  Fetal Monitoring:  Baseline: 135 bpm, Variability: Good {> 6 bpm), Accelerations: Reactive and Decelerations: Absent  Labs:  Results for orders placed or performed during the hospital encounter of 07/03/19 (from the past 24 hour(s))  Glucose, capillary   Collection Time: 07/03/19  4:16 PM  Result Value Ref Range   Glucose-Capillary 59 (L) 70 - 99 mg/dL  Wet prep, genital   Collection Time: 07/03/19  6:05 PM  Result Value Ref Range   Yeast Wet Prep HPF POC NONE SEEN NONE SEEN   Trich, Wet Prep NONE SEEN NONE SEEN    Clue Cells Wet Prep HPF POC PRESENT (A) NONE SEEN   WBC, Wet Prep HPF POC FEW (A) NONE SEEN   Sperm NONE SEEN   Urine rapid drug screen (hosp performed)   Collection Time: 07/03/19  6:05 PM  Result Value Ref Range   Opiates NONE DETECTED NONE DETECTED   Cocaine NONE DETECTED NONE DETECTED   Benzodiazepines NONE DETECTED NONE DETECTED   Amphetamines NONE DETECTED NONE DETECTED   Tetrahydrocannabinol NONE DETECTED NONE DETECTED   Barbiturates NONE DETECTED NONE DETECTED  Urinalysis, Routine w reflex microscopic   Collection Time: 07/03/19  6:05 PM  Result Value Ref Range   Color, Urine AMBER (A) YELLOW   APPearance TURBID (A) CLEAR   Specific Gravity, Urine 1.015 1.005 - 1.030   pH 5.0 5.0 - 8.0   Glucose, UA NEGATIVE NEGATIVE mg/dL   Hgb urine dipstick SMALL (A) NEGATIVE   Bilirubin Urine NEGATIVE NEGATIVE   Ketones, ur 20 (A) NEGATIVE mg/dL   Protein, ur 30 (A) NEGATIVE mg/dL   Nitrite NEGATIVE NEGATIVE   Leukocytes,Ua LARGE (A) NEGATIVE   RBC / HPF 21-50 0 - 5 RBC/hpf   WBC, UA 11-20 0 - 5 WBC/hpf   Bacteria, UA NONE SEEN NONE SEEN   Squamous Epithelial / LPF 21-50 0 - 5   Mucus PRESENT   SARS Coronavirus 2 by RT PCR (hospital order, performed in [facility] hospital lab) Nasopharyngeal Nasopharyngeal Swab   Collection Time: 07/03/19  6:08 PM   Specimen: Nasopharyngeal Swab  Result Value Ref Range   SARS Coronavirus 2 NEGATIVE NEGATIVE  Hepatitis  B surface antigen   Collection Time: 07/03/19  6:54 PM  Result Value Ref Range   Hepatitis B Surface Ag NON REACTIVE NON REACTIVE  CBC   Collection Time: 07/03/19  6:54 PM  Result Value Ref Range   WBC 6.9 4.0 - 10.5 K/uL   RBC 3.55 (L) 3.87 - 5.11 MIL/uL   Hemoglobin 10.9 (L) 12.0 - 15.0 g/dL   HCT 33.0 (L) 36 - 46 %   MCV 93.0 80.0 - 100.0 fL   MCH 30.7 26.0 - 34.0 pg   MCHC 33.0 30.0 - 36.0 g/dL   RDW 13.5 11.5 - 15.5 %   Platelets 212 150 - 400 K/uL   nRBC 0.0 0.0 - 0.2 %  Differential   Collection Time: 07/03/19   6:54 PM  Result Value Ref Range   Neutrophils Relative % 75 %   Neutro Abs 5.2 1.7 - 7.7 K/uL   Lymphocytes Relative 18 %   Lymphs Abs 1.3 0.7 - 4.0 K/uL   Monocytes Relative 7 %   Monocytes Absolute 0.5 0 - 1 K/uL   Eosinophils Relative 0 %   Eosinophils Absolute 0.0 0 - 0 K/uL   Basophils Relative 0 %   Basophils Absolute 0.0 0 - 0 K/uL   Immature Granulocytes 0 %   Abs Immature Granulocytes 0.01 0.00 - 0.07 K/uL  HIV Antibody (routine testing w rflx)   Collection Time: 07/03/19  6:54 PM  Result Value Ref Range   HIV Screen 4th Generation wRfx Non Reactive Non Reactive  Comprehensive metabolic panel   Collection Time: 07/03/19  6:54 PM  Result Value Ref Range   Sodium 137 135 - 145 mmol/L   Potassium 3.3 (L) 3.5 - 5.1 mmol/L   Chloride 104 98 - 111 mmol/L   CO2 23 22 - 32 mmol/L   Glucose, Bld 104 (H) 70 - 99 mg/dL   BUN 5 (L) 6 - 20 mg/dL   Creatinine, Ser 0.70 0.44 - 1.00 mg/dL   Calcium 9.1 8.9 - 10.3 mg/dL   Total Protein 7.0 6.5 - 8.1 g/dL   Albumin 2.6 (L) 3.5 - 5.0 g/dL   AST 29 15 - 41 U/L   ALT 26 0 - 44 U/L   Alkaline Phosphatase 166 (H) 38 - 126 U/L   Total Bilirubin 0.7 0.3 - 1.2 mg/dL   GFR calc non Af Amer >60 >60 mL/min   GFR calc Af Amer >60 >60 mL/min   Anion gap 10 5 - 15  Type and screen [place]   Collection Time: 07/03/19  6:54 PM  Result Value Ref Range   ABO/RH(D) O POS    Antibody Screen POS    Sample Expiration 07/06/2019,2359    Antibody Identification NO CLINICALLY SIGNIFICANT ANTIBODY IDENTIFIED    Unit Number [id number]    Blood Component Type RED CELLS,LR    Unit division 00    Status of Unit ALLOCATED    Transfusion Status OK TO TRANSFUSE    Crossmatch Result COMPATIBLE    Unit Number [id number]    Blood Component Type RED CELLS,LR    Unit division 00    Status of Unit ALLOCATED    Transfusion Status OK TO TRANSFUSE    Crossmatch Result COMPATIBLE   BPAM RBC   Collection Time: 07/03/19  6:54 PM   Result Value Ref Range   Blood Product Unit Number [id number]    PRODUCT CODE R4270W23    Unit Type and Rh 5100  Blood Product Expiration Date 979150413643    ISSUE DATE / TIME [id number]    Blood Product Unit Number [id number]    PRODUCT CODE E0382V00    Unit Type and Rh 5100    Blood Product Expiration Date [id number]     Imaging Studies:    Final report pending but BPD 91 mm, = 36+ wk,HC =39.0, AC 38.3, FL 38.4.  Medications:  Scheduled . docusate sodium  100 mg Oral Daily  . prenatal vitamin w/FE, FA  1 tablet Oral Q1200   I have reviewed the patient's current medications. No current antipsychotics  ASSESSMENT:  Pregnancy term, no PNC Schizoaffective disorder bipolar type Prior cesarean not desiring TOLAC  Patient Active Problem List   Diagnosis Date Noted  . Adjustment disorder with mixed disturbance of emotions and conduct 07/11/2017  . Acute psychosis ([place]) 12/21/2015  . Insomnia   . Anxiety state   . Overactive bladder   . Diabetes mellitus ([place]) 02/08/2015  . Schizoaffective disorder, bipolar type ([place]) 01/28/2015  . Non compliance w medication regimen     PLAN: With elevated BP this a.m, and no baseline BP's will complete Preeclampsia w/u . LFT normal,  PR/Cr urine ordered.  Placed NPO til results  Jonnie Kind 07/04/2019,7:19 AM    Patient ID: [name], female   DOB: Apr 24, 1980, 39 y.o.   MRN: [id number]

## 2019-07-04 NOTE — Progress Notes (Signed)
CSW received return call from [facility] staff member Aqua, who reported that the recommendation from attending psychiatrist (Dr. Weber Cooks) is for patient to deliver and then be re-evaluated for inpatient psychiatric treatment. CSW updated RN, who agreed to update MD.   Abundio Miu, [place] Worker [facility] Cell#: [phone]

## 2019-07-04 NOTE — Progress Notes (Signed)
CSW contacted [facility] Disposition social worker to inquire about bed availability for inpatient psychiatric treatment. Disposition social worker reported that patient is currently under review at [facility].  CSW contacted [facility] and spoke with staff member Aqua. Staff confirmed that patient is under review and agreed to contact CSW once a decision has been made.   Abundio Miu, [place] Worker [facility] Cell#: [phone]

## 2019-07-04 NOTE — Progress Notes (Addendum)
OB Note  I went to discuss with the patient re: my recommendation that she have a formal anatomy u/s and a GTT. I also came by to recommend delivery given her most recent BP which gives her the dx of gHTN based on two mild range BPs > 24 hours apart.  Patient very agitated and angry with lots of yelling. I told the patient my rationale for the testing and that it is for both mom and baby's health and also with regards to delivery but patient states she doesn't want another ultrasound and that she does not have diabetes b/c her a1c is not diagnostic of diabetes. I explained to her that the a1c is a rough estimate of blood sugar control but it is not able to 100% rule out diabetes in pregnancy. She said her other doctors ruled her out for diabetes although she wouldn't state where this was. I also d/w her re: her gHTN dx but she stated that she controls her BPs.  She yelled at me and stated that she wanted someone else to take care of her. I told her that I am only telling her these to inform her of what's going on and to give her my recommendations and that it is up to her whether she would like to proceed with them. I told her that I will talk to her tomorrow. 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  We will continue to follow.   Durene Romans MD Attending Center for [employer] (Faculty Practice) 07/04/2019 Time: [phone]

## 2019-07-04 NOTE — Progress Notes (Signed)
OB Note Since [facility] recommends delivery before transfer, psych was formally consulted and they said they would come by and see her for formal recommendations in the interim   Durene Romans MD Attending Center for [employer] (Faculty Practice) 07/04/2019 Time: [phone]

## 2019-07-04 NOTE — Progress Notes (Signed)
Patient refuses to have monitors applied at this time. Patient also refuses to have blood drawn or urine sample collected. Patient states that she is requesting another physician as her itching is not being treated. Patient continues to ramble about different stories. Will continue to monitor and come back later. Patient refuses to be NPO at this time and states she knows her rights.

## 2019-07-04 NOTE — Progress Notes (Signed)
Patient allowed nurse tech to obtain her vital signs at this time. See chart for vital sign values.

## 2019-07-05 ENCOUNTER — Inpatient Hospital Stay
Admission: RE | Admit: 2019-07-05 | Discharge: 2019-07-06 | DRG: 833 | Disposition: A | Discharge status: Home / Self Care | Payer: Medicare Other | Source: Intra-hospital | Attending: Psychiatry | Admitting: Psychiatry

## 2019-07-05 ENCOUNTER — Encounter: Payer: Self-pay | Admitting: Psychiatry

## 2019-07-05 ENCOUNTER — Encounter (HOSPITAL_COMMUNITY): Payer: Self-pay | Admitting: Obstetrics and Gynecology

## 2019-07-05 ENCOUNTER — Other Ambulatory Visit: Payer: Self-pay | Admitting: Psychiatry

## 2019-07-05 ENCOUNTER — Other Ambulatory Visit: Payer: Self-pay

## 2019-07-05 DIAGNOSIS — O99343 Other mental disorders complicating pregnancy, third trimester: Principal | ICD-10-CM | POA: Diagnosis present

## 2019-07-05 DIAGNOSIS — O99891 Other specified diseases and conditions complicating pregnancy: Secondary | ICD-10-CM

## 2019-07-05 DIAGNOSIS — Z3493 Encounter for supervision of normal pregnancy, unspecified, third trimester: Secondary | ICD-10-CM

## 2019-07-05 DIAGNOSIS — F250 Schizoaffective disorder, bipolar type: Secondary | ICD-10-CM | POA: Diagnosis present

## 2019-07-05 DIAGNOSIS — R451 Restlessness and agitation: Secondary | ICD-10-CM

## 2019-07-05 DIAGNOSIS — Z3A38 38 weeks gestation of pregnancy: Secondary | ICD-10-CM

## 2019-07-05 DIAGNOSIS — O34219 Maternal care for unspecified type scar from previous cesarean delivery: Secondary | ICD-10-CM

## 2019-07-05 DIAGNOSIS — O0933 Supervision of pregnancy with insufficient antenatal care, third trimester: Secondary | ICD-10-CM

## 2019-07-05 DIAGNOSIS — O09523 Supervision of elderly multigravida, third trimester: Secondary | ICD-10-CM

## 2019-07-05 DIAGNOSIS — O133 Gestational [pregnancy-induced] hypertension without significant proteinuria, third trimester: Secondary | ICD-10-CM

## 2019-07-05 LAB — BILE ACIDS, TOTAL: Bile Acids Total: 6.5 umol/L (ref 0.0–10.0)

## 2019-07-05 MED ORDER — HYDROXYZINE HCL 10 MG PO TABS: 10.0000 mg | ORAL_TABLET | Freq: Three times a day (TID) | ORAL | Status: DC | PRN

## 2019-07-05 MED ORDER — ALUM & MAG HYDROXIDE-SIMETH 200-200-20 MG/5ML PO SUSP: 30.0000 mL | ORAL | Status: DC | PRN

## 2019-07-05 MED ORDER — MAGNESIUM HYDROXIDE 400 MG/5ML PO SUSP: 30.0000 mL | Freq: Every day | ORAL | Status: DC | PRN

## 2019-07-05 MED ORDER — POTASSIUM CHLORIDE CRYS ER 20 MEQ PO TBCR: 20.0000 meq | EXTENDED_RELEASE_TABLET | Freq: Two times a day (BID) | ORAL | Status: AC

## 2019-07-05 MED ORDER — OLANZAPINE 10 MG PO TABS
10.0000 mg | ORAL_TABLET | Freq: Two times a day (BID) | ORAL | Status: DC
  Administered 2019-07-05: 10 mg via ORAL
  Filled 2019-07-05 (×2): qty 1

## 2019-07-05 MED ORDER — CALAMINE EX LOTN
TOPICAL_LOTION | CUTANEOUS | Status: DC | PRN
  Filled 2019-07-05: qty 177

## 2019-07-05 MED ORDER — COMPLETENATE 29-1 MG PO CHEW: 1.0000 | CHEWABLE_TABLET | Freq: Every day | ORAL | Status: DC

## 2019-07-05 MED ORDER — PRENATAL MULTIVITAMIN CH
1.0000 | ORAL_TABLET | Freq: Every day | ORAL | Status: DC
  Filled 2019-07-05: qty 1

## 2019-07-05 MED ORDER — ACETAMINOPHEN 325 MG PO TABS: 650.0000 mg | ORAL_TABLET | Freq: Four times a day (QID) | ORAL | Status: DC | PRN

## 2019-07-05 MED ORDER — DOCUSATE SODIUM 100 MG PO CAPS: 100.0000 mg | ORAL_CAPSULE | Freq: Two times a day (BID) | ORAL | Status: DC | PRN

## 2019-07-05 MED ORDER — CALCIUM CARBONATE ANTACID 500 MG PO CHEW
2.0000 | CHEWABLE_TABLET | ORAL | Status: DC | PRN
  Filled 2019-07-05: qty 2

## 2019-07-05 NOTE — Progress Notes (Signed)
Patient left unit with [employer] to be escorted to [place]

## 2019-07-05 NOTE — Plan of Care (Signed)
New admission  Problem: Activity: Goal: Will verbalize the importance of balancing activity with adequate rest periods Outcome: Not Progressing   Problem: Education: Goal: Will be free of psychotic symptoms Outcome: Not Progressing Goal: Knowledge of the prescribed therapeutic regimen will improve Outcome: Not Progressing   Problem: Coping: Goal: Coping ability will improve Outcome: Not Progressing Goal: Will verbalize feelings Outcome: Not Progressing   Problem: Health Behavior/Discharge Planning: Goal: Compliance with prescribed medication regimen will improve Outcome: Not Progressing   Problem: Nutritional: Goal: Ability to achieve adequate nutritional intake will improve Outcome: Not Progressing   Problem: Role Relationship: Goal: Ability to communicate needs accurately will improve Outcome: Not Progressing Goal: Ability to interact with others will improve Outcome: Not Progressing   Problem: Safety: Goal: Ability to redirect hostility and anger into socially appropriate behaviors will improve Outcome: Not Progressing Goal: Ability to remain free from injury will improve Outcome: Not Progressing   Problem: Self-Care: Goal: Ability to participate in self-care as condition permits will improve Outcome: Not Progressing   Problem: Self-Concept: Goal: Will verbalize positive feelings about self Outcome: Not Progressing   Problem: Education: Goal: Knowledge of [place] [facility] Education information/materials will improve Outcome: Not Progressing Goal: Emotional status will improve Outcome: Not Progressing Goal: Mental status will improve Outcome: Not Progressing Goal: Verbalization of understanding the information provided will improve Outcome: Not Progressing   Problem: Activity: Goal: Interest or engagement in activities will improve Outcome: Not Progressing Goal: Sleeping patterns will improve Outcome: Not Progressing   Problem: Coping: Goal: Ability  to verbalize frustrations and anger appropriately will improve Outcome: Not Progressing Goal: Ability to demonstrate self-control will improve Outcome: Not Progressing   Problem: Health Behavior/Discharge Planning: Goal: Identification of resources available to assist in meeting health care needs will improve Outcome: Not Progressing Goal: Compliance with treatment plan for underlying cause of condition will improve Outcome: Not Progressing   Problem: Physical Regulation: Goal: Ability to maintain clinical measurements within normal limits will improve Outcome: Not Progressing   Problem: Safety: Goal: Periods of time without injury will increase Outcome: Not Progressing

## 2019-07-05 NOTE — Care Management (Signed)
Consult for HH/DME. Patient had no needs for [facility] or DME.  CM spoke to [place] she is following patient.    Rosita Fire RNC-MNN, BSN Transitions of Care Pediatrics/Women's and [place]

## 2019-07-05 NOTE — Discharge Summary (Addendum)
Physician Discharge Summary  Patient ID: [name] [name] MRN: [id number] DOB/AGE: 09/07/1980 39 y.o.  Admit date: 07/03/2019 Discharge date: 07/05/2019  Admission Diagnoses:  Possible psychotic episode. Pregnancy at 38/0 weeks (dated by 38wk ultrasound). No prenatal care. AMA. History of prior c-section  Discharge Diagnoses: Pregnancy 38/2 weeks. Gestational hypertension. Same.  Discharged Condition: stable  Hospital Course: Patient to [facility] triage via EMS due to acting agitated and likely psychotic episode. OB work up that patient allowed was normal; pt did not allow GTT or a formal anatomy u/s, but her growth u/s on 6/27 showed 3458gm fetus with normal AFI and at 38/1 weeks with a fundal placenta, cephalic. She did meet criteria for gestational HTN with a negative PC ratio and CMP, but patient declined delivery and diagnosis. Psychiatric consulted, she was started on any medications but she was transferred to [facility] for psych services on HD#3.   Consults: psychiatry. Social work  Significant Diagnostic Studies: Growth and dating ultrasound. New OB lab work  Discharge Exam: Blood pressure (!) 142/80, pulse 96, temperature 97.7 F (36.5 C), temperature source Oral, resp. rate 18, height 5\' 4"  (1.626 m), weight 92.9 kg, SpO2 100 %. General: sitting, wearing hospital gown. Yelling at MD  Disposition: to [facility]   Allergies as of 07/05/2019      Reactions   Quetiapine Anaphylaxis   "Pass out"   Gabapentin Other (See Comments)   "Feet on fire"   Lorazepam    Risperidone Other (See Comments)   Pt reports "It makes me blind"   Valproic Acid Other (See Comments)   Pt reports "it makes me too sleepy"   Aripiprazole Other (See Comments)   Makes patient "too tired"   Fluphenazine Rash   Haloperidol Other (See Comments)   Makes feel hot inside. "Tires my tongue"   Trazodone Other (See Comments)   Ziprasidone Hcl Palpitations        40 minutes were spent in  discharge planning  Signed: Aletha Halim 07/05/2019, 10:25 AM

## 2019-07-05 NOTE — Tx Team (Signed)
Initial Treatment Plan 07/05/2019 5:55 PM [name] [name] JEH:[id number]    PATIENT STRESSORS: Other: pregnancy    PATIENT STRENGTHS: Average or above average intelligence Communication skills   PATIENT IDENTIFIED PROBLEMS: psychosis                      DISCHARGE CRITERIA:  Ability to meet basic life and health needs Improved stabilization in mood, thinking, and/or behavior Verbal commitment to aftercare and medication compliance  PRELIMINARY DISCHARGE PLAN: Outpatient therapy Return to previous living arrangement Return to previous work or school arrangements  PATIENT/FAMILY INVOLVEMENT: This treatment plan has been presented to and reviewed with the patient, [name] [name]. The patient has been given the opportunity to ask questions and make suggestions.  Kieth Brightly, RN 07/05/2019, 5:55 PM

## 2019-07-05 NOTE — Progress Notes (Signed)
OB Note 1022am  I spoke to Dr Weber Cooks re: patient. OB wise she has mild range BPs but no e/o pre-eclampsia. Given she is over 37wks, gestational HTN is an indication for delivery, but as I noted previously, patient unwilling to talk about her OB care. I told him that we will make sure she is fine for transfer to [facility], with a non stress test and a repeat set of vitals. I said that if her daily fetal assessments are fine and her blood pressures aren't severe (>155 SBP and/or >774 diastolic) and negative ROS for severe pre-eclampsia (severe HA, visual changes, RUQ pain) that delaying delivery for a few days to get her stable from a psych standpoint is fine. I recommend having OB at [facility] ultimately make the final decision.  Per RN, patient requesting to speak to SW. Hopefully since patient has eaten breakfast and will talk to SW that she will allow [place] to do her NST for today and be amenable to transfer.  Durene Romans MD Attending Center for [place] [facility] (Faculty Practice) GYN Consult Phone: [phone] (M-F, [phone]) & [phone] (Off hours, weekends, holidays)

## 2019-07-05 NOTE — BH Assessment (Signed)
Pt admission is scheduled between 7:30-8pm Today  Patient has been accepted to [facility] physician is Dr. Weber Cooks.  AttendingPhysician will be Dr.Clapacs.  Patient has been assigned to room 309, by [place].  Call report to [phone].  Representative/Transfer Coordinator is Agustine Rossitto Patient pre-admitted by [facility] Patient Access Shannan Harper)  [facility] ER Staff Joelene Millin, LCSW) made aware of acceptance.

## 2019-07-05 NOTE — Progress Notes (Signed)
Patient informed she would be transported to [facility] at this time. Patient agrees to transfer.

## 2019-07-05 NOTE — Progress Notes (Signed)
Daily Antepartum Note  Admission Date: 07/03/2019 Current Date: 07/05/2019 9:00 AM  Wilbur Labuda is a 39 y.o. G2P1001 @ [redacted]w[redacted]d by 38wk u/s, HD#3, admitted for psychiatric care, no prenatal care.  Pregnancy complicated by: Patient Active Problem List   Diagnosis Date Noted  . AMA (advanced maternal age) multigravida 71+, third trimester 07/04/2019  . Supervision of high risk pregnancy, antepartum 07/04/2019  . No prenatal care in current pregnancy in third trimester 07/04/2019  . Obesity in pregnancy 07/04/2019  . BMI 30s 07/04/2019  . History of cesarean delivery 07/04/2019  . Short interval between pregnancies affecting pregnancy in third trimester, antepartum 07/04/2019  . Adjustment disorder with mixed disturbance of emotions and conduct 07/11/2017  . Acute psychosis ([place]) 12/21/2015  . Insomnia   . Anxiety state   . Overactive bladder   . Diabetes mellitus ([place]) 02/08/2015  . Schizoaffective disorder, bipolar type ([place]) 01/28/2015  . Non compliance w medication regimen     Overnight/24hr events:  See nursing notes. No obstetrical complaints  Subjective:  Patient refused to answer any of my questions and looked away from me on ROS assessment During this, she suddenly started yelling me and said that I didn't even apologize and all I want to do is do tests on her. I told her that I apologized for anything I did wrong, and that I'm not asking her to do any tests currently, but she continued to yell at me asked me to leave the room so I left.   Objective:    Current Vital Signs 24h Vital Sign Ranges  T 97.7 F (36.5 C) Temp  Avg: 97.9 F (36.6 C)  Min: 97.7 F (36.5 C)  Max: 98 F (36.7 C)  BP  (patient refused vitals) BP  Min: 117/90  Max: 142/80  HR 96 Pulse  Avg: 101  Min: 96  Max: 106  RR 18 Resp  Avg: 17.5  Min: 17  Max: 18  SaO2 100 %   SpO2  Avg: 100 %  Min: 100 %  Max: 100 %       24 Hour I/O Current Shift I/O  Time Ins Outs No intake/output data  recorded. No intake/output data recorded.   Patient Vitals for the past 24 hrs:  BP Temp Temp src Pulse Resp SpO2  07/04/19 2000 (!) 142/80 97.7 F (36.5 C) Oral 96 18 100 %  07/04/19 1656 117/90 98 F (36.7 C) Oral (!) 106 17 100 %   FHT: 140 baseline, +accels, no decel, mod variability Toco: quiet x 30 minutes  Physical exam: General:  Sitting in bed, wearing hospital gown and her wig. See above  Medications: Current Facility-Administered Medications  Medication Dose Route Frequency Provider Last Rate Last Admin  . acetaminophen (TYLENOL) tablet 650 mg  650 mg Oral Q4H PRN Jonnie Kind, MD      . [place] Soothing Bath Treatment 1 packet  1 packet Topical Daily PRN Jonnie Kind, MD   1 packet at 07/05/19 [phone]  . calamine lotion   Topical PRN Jonnie Kind, MD   Given at 07/04/19 [phone]  . calcium carbonate (TUMS - dosed in mg elemental calcium) chewable tablet 400 mg of elemental calcium  2 tablet Oral Q4H PRN Jonnie Kind, MD      . docusate sodium (COLACE) capsule 100 mg  100 mg Oral BID PRN Aletha Halim, MD      . hydrOXYzine (ATARAX/VISTARIL) tablet 10 mg  10 mg Oral TID PRN Ilda Basset,  Eduard Clos, MD   10 mg at 07/04/19 2217  . potassium chloride SA (KLOR-CON) CR tablet 20 mEq  20 mEq Oral BID Aletha Halim, MD   20 mEq at 07/04/19 2217  . prenatal vitamin w/FE, FA (NATACHEW) chewable tablet 1 tablet  1 tablet Oral Q1200 Rasch, Anderson Malta I, NP   1 tablet at 07/04/19 1009    Labs:  Bile acids 6.5  Radiology:  No new imaging  Assessment & Plan:  Pt stable *Pregnancy: based on her reactive NST, fetal status is reassuring.  -pt declined full anatomy u/s and GTT screening *Gestational HTN: based on her blood pressures, as I tried to tell her yesterday evening, I recommend delivery, since she is over 37 weeks, but the patient was yelling and screaming at me when I tried yesterday; patient asked me to leave today before I could talk to her about her pregnancy. Based  on her talking Sunday/Monday, she stated she wanted to be delivered in 3 days based on prophesy. I will call the OR and post her for Thursday *Psych: recs yesterday are PRN meds and to assess after delivery. I tried to contact them today to see if there's anything to do to help the patient allow [place] to talk to her about her care and give her recommendations. I will continue to try and get in touch with them.  *PPx: SCDs *FEN/GI: regular diet, PO k-dur *Dispo: see above  Durene Romans MD Attending Center for [place] (Faculty Practice) GYN Consult Phone: [phone] (M-F, [phone]) & [phone] (Off hours, weekends, holidays)

## 2019-07-05 NOTE — Progress Notes (Signed)
Patient has become aggressive and refuses to speak to nurses. Patient request charge nurse because she wants a phone left in her room and wants to be left alone without a person in her room. Patient states if she can not have these things she wants to be discharged.

## 2019-07-05 NOTE — Consult Note (Signed)
Discharge/readmission orders have been placed to allow for transfer to [facility].

## 2019-07-05 NOTE — Progress Notes (Signed)
Patient refuses vitals and to be monitored at this time. Multiple attempts made to see if patient needed something or would consider fetal monitoring. Patient becomes more irate with more questions.

## 2019-07-06 DIAGNOSIS — Z3493 Encounter for supervision of normal pregnancy, unspecified, third trimester: Secondary | ICD-10-CM

## 2019-07-06 DIAGNOSIS — F250 Schizoaffective disorder, bipolar type: Secondary | ICD-10-CM

## 2019-07-06 DIAGNOSIS — Z3A38 38 weeks gestation of pregnancy: Secondary | ICD-10-CM

## 2019-07-06 LAB — RUPTURE OF MEMBRANE (ROM)PLUS: Rom Plus: NEGATIVE

## 2019-07-06 MED ORDER — PRENATAL MULTIVITAMIN CH: 1.0000 | ORAL_TABLET | Freq: Every day | ORAL | 0 refills | Status: DC

## 2019-07-06 NOTE — H&P (Signed)
Psychiatric Admission Assessment Adult  Patient Identification: [name] [name] MRN:  [id number] Date of Evaluation:  07/06/2019 Chief Complaint:  Schizoaffective disorder, bipolar type ([place]) [F25.0] Principal Diagnosis: Schizoaffective disorder, bipolar type ([place]) Diagnosis:  Principal Problem:   Schizoaffective disorder, bipolar type ([place]) Active Problems:   Third trimester pregnancy  History of Present Illness: Patient seen and chart reviewed.  39 year old woman with a past history of chronic mental health issues who was transferred to [place] from [place].  She had presented there several days ago with initial concerns just about her pregnancy and believing that she was going to deliver her baby.  It was evident that the patient had mental health issues and an evaluation was requested from psychiatry who had recommended inpatient treatment.  Patient is only slightly cooperative today but there is information available in the chart.  Patient herself does not believe she is having any mental health symptoms.  When she will answer questions she denies any symptoms or mental health complaints.  Her affect comes across as inappropriately angry and hostile but when this is pointed out she will angrily deny that that is what she is doing.  At no time has she threatened me or anyone else directly nor has she done anything physically aggressive.  She has not made any suicidal statements.  On interview today she denies any suicidal or homicidal ideation.  She states that she wants to be discharged back home to [facility] where she will follow-up to deliver the baby.  Patient is paranoid and unwilling to give much detail about where she is staying but it seems evident that she had had a reasonable place to stay prior to coming into the hospital.  Patient refuses to even consider taking any kind of psychiatric medicine currently. Associated Signs/Symptoms: Depression Symptoms:  Nothing really  reported (Hypo) Manic Symptoms:  Irritable Mood, Anxiety Symptoms:  Will not answer any questions about this Psychotic Symptoms:  Paranoia, Disorganized thinking and a general sense of paranoia although I did not find her making any obvious delusional statements nor does she appear from what I can tell to be responding to hallucinations PTSD Symptoms: Negative Total Time spent with patient: 1 hour  Past Psychiatric History: From what I can piece together in the chart this patient has chronic mental health issues but it is not clear if she has ever really followed up with outpatient treatment.  She first apparently arrived in [place] about 4 or 5 years ago and since then has been staying with various friends or acquaintances or boyfriends in [place].  She has had multiple contacts with the mental health system during all of which it sounds she was more or less like she is now.  She seems to have pretty consistently refused medication although it looks like there were some times when she would take it for a day or 2 in the hospital.  Patient will not confirm with me that she is ever been compliant with any outpatient treatment.  She denies having ever tried to hurt herself or kill her self in the past and denies any history of being violent to others and I could not find anything in the chart that would contradict this.  Patient denies that she has been using any alcohol or drugs recently  Is the patient at risk to self? No.  Has the patient been a risk to self in the past 6 months? No.  Has the patient been a risk to self within the distant past?  No.  Is the patient a risk to others? No.  Has the patient been a risk to others in the past 6 months? No.  Has the patient been a risk to others within the distant past? No.   Prior Inpatient Therapy:   Prior Outpatient Therapy:    Alcohol Screening: Patient refused Alcohol Screening Tool: Yes 1. How often do you have a drink containing  alcohol?: Never 2. How many drinks containing alcohol do you have on a typical day when you are drinking?: 1 or 2 3. How often do you have six or more drinks on one occasion?: Never AUDIT-C Score: 0 4. How often during the last year have you found that you were not able to stop drinking once you had started?: Never 5. How often during the last year have you failed to do what was normally expected from you because of drinking?: Never 6. How often during the last year have you needed a first drink in the morning to get yourself going after a heavy drinking session?: Never 7. How often during the last year have you had a feeling of guilt of remorse after drinking?: Never 8. How often during the last year have you been unable to remember what happened the night before because you had been drinking?: Never 9. Have you or someone else been injured as a result of your drinking?: No 10. Has a relative or friend or a doctor or another health worker been concerned about your drinking or suggested you cut down?: No Alcohol Use Disorder Identification Test Final Score (AUDIT): 0 Alcohol Brief Interventions/Follow-up: AUDIT Score <7 follow-up not indicated Substance Abuse History in the last 12 months:  No. Consequences of Substance Abuse: Negative Previous Psychotropic Medications: Yes  Psychological Evaluations: Yes  Past Medical History:  Past Medical History:  Diagnosis Date  . Bipolar affective disorder, currently manic, mild ([place])   . Diabetes mellitus without complication ([place])   . Schizophrenia [facility])     Past Surgical History:  Procedure Laterality Date  . CESAREAN SECTION  04/2018  . WISDOM TOOTH EXTRACTION     Family History:  Family History  Problem Relation Age of Onset  . Drug abuse Maternal Uncle    Family Psychiatric  History: Patient declines to answer any questions about this Tobacco Screening: Have you used any form of tobacco in the last 30 days? (Cigarettes, Smokeless  Tobacco, Cigars, and/or Pipes): No Social History:  Social History   Substance and Sexual Activity  Alcohol Use No     Social History   Substance and Sexual Activity  Drug Use No    Additional Social History:                           Allergies:   Allergies  Allergen Reactions  . Quetiapine Anaphylaxis    "Pass out"  . Gabapentin Other (See Comments)    "Feet on fire"  . Lorazepam   . Risperidone Other (See Comments)    Pt reports "It makes me blind"   . Valproic Acid Other (See Comments)    Pt reports "it makes me too sleepy"   . Aripiprazole Other (See Comments)    Makes patient "too tired"  . Fluphenazine Rash  . Haloperidol Other (See Comments)    Makes feel hot inside. "Tires my tongue"   . Trazodone Other (See Comments)  . Ziprasidone Hcl Palpitations   Lab Results:  Results for orders placed or  performed during the hospital encounter of 07/05/19 (from the past 48 hour(s))  ROM Plus ([facility] only)     Status: None   Collection Time: 07/06/19  6:00 AM  Result Value Ref Range   Rom Plus NEGATIVE     Comment: Performed at [facility], [address] [place], [place] [address]    Blood Alcohol level:  Lab Results  Component Value Date   [facility] <10 11/19/2017   ETH <10 28/78/6767    Metabolic Disorder Labs:  Lab Results  Component Value Date   HGBA1C 5.7 (H) 07/04/2019   MPG 116.89 07/04/2019   MPG 97 10/07/2017   Lab Results  Component Value Date   PROLACTIN 46.5 (H) 07/25/2015   PROLACTIN 21.6 02/01/2015   Lab Results  Component Value Date   CHOL 182 10/07/2017   TRIG 46 10/07/2017   HDL 65 10/07/2017   CHOLHDL 2.8 10/07/2017   VLDL 9 10/07/2017   LDLCALC 108 (H) 10/07/2017   LDLCALC 73 07/25/2015    Current Medications: Current Facility-Administered Medications  Medication Dose Route Frequency Provider Last Rate Last Admin  . acetaminophen (TYLENOL) tablet 650 mg  650 mg Oral Q6H PRN Johnwilliam Shepperson T, MD      . alum  & mag hydroxide-simeth (MAALOX/MYLANTA) 200-200-20 MG/5ML suspension 30 mL  30 mL Oral Q4H PRN Nyasha Rahilly, Madie Reno, MD      . calamine lotion   Topical PRN Adenike Shidler, Madie Reno, MD      . calcium carbonate (TUMS - dosed in mg elemental calcium) chewable tablet 400 mg of elemental calcium  2 tablet Oral Q4H PRN Ilani Otterson T, MD      . docusate sodium (COLACE) capsule 100 mg  100 mg Oral BID PRN Jaclyn Carew, Madie Reno, MD      . hydrOXYzine (ATARAX/VISTARIL) tablet 10 mg  10 mg Oral TID PRN Daud Cayer T, MD      . magnesium hydroxide (MILK OF MAGNESIA) suspension 30 mL  30 mL Oral Daily PRN Maitri Schnoebelen T, MD      . OLANZapine (ZYPREXA) tablet 10 mg  10 mg Oral BID Jenaye Rickert, Madie Reno, MD   10 mg at 07/05/19 1907  . prenatal multivitamin tablet 1 tablet  1 tablet Oral Q1200 Santanna Olenik, Madie Reno, MD       PTA Medications: No medications prior to admission.    Musculoskeletal: Strength & Muscle Tone: within normal limits Gait & Station: normal Patient leans: N/A  Psychiatric Specialty Exam: Physical Exam Vitals and nursing note reviewed.  Constitutional:      Appearance: She is well-developed.  HENT:     Head: Normocephalic and atraumatic.  Eyes:     Conjunctiva/sclera: Conjunctivae normal.     Pupils: Pupils are equal, round, and reactive to light.  Cardiovascular:     Heart sounds: Normal heart sounds.  Pulmonary:     Effort: Pulmonary effort is normal.  Abdominal:     General: There is distension.    Musculoskeletal:        General: Normal range of motion.     Cervical back: Normal range of motion.  Skin:    General: Skin is warm and dry.  Neurological:     Mental Status: She is alert.  Psychiatric:        Attention and Perception: She is inattentive.        Mood and Affect: Affect is labile and angry.        Speech: Speech is rapid and  pressured.        Behavior: Behavior is agitated.        Thought Content: Thought content is paranoid. Thought content does not include homicidal or  suicidal ideation.        Cognition and Memory: Memory is impaired.        Judgment: Judgment is impulsive and inappropriate.     Review of Systems  Constitutional: Negative.   HENT: Negative.   Eyes: Negative.   Respiratory: Negative.   Cardiovascular: Negative.   Gastrointestinal: Negative.   Musculoskeletal: Negative.   Skin: Negative.   Neurological: Negative.   Psychiatric/Behavioral: Negative.        Patient sees herself as not having any kind of psychiatric condition and denies any symptoms    Blood pressure 116/75, pulse 100, temperature 98.9 F (37.2 C), temperature source Oral, resp. rate 18, height 5\' 4"  (1.626 m), weight 92.9 kg, SpO2 100 %.Body mass index is 35.16 kg/m.  General Appearance: Casual  Eye Contact:  Good  Speech:  Pressured  Volume:  Increased  Mood:  Angry and Irritable  Affect:  Inappropriate  Thought Process:  Disorganized  Orientation:  Full (Time, Place, and Person)  Thought Content:  Paranoid Ideation and Tangential  Suicidal Thoughts:  No  Homicidal Thoughts:  No  Memory:  Immediate;   Fair Recent;   Poor Remote;   Poor  Judgement:  Impaired  Insight:  Shallow  Psychomotor Activity:  Normal  Concentration:  Concentration: Fair  Recall:  [employer] of Knowledge:  Fair  Language:  Fair  Akathisia:  No  Handed:  Right  AIMS (if indicated):     Assets:  Desire for Improvement Resilience  ADL's:  Intact  Cognition:  WNL  Sleep:  Number of Hours: 4    Treatment Plan Summary: Plan This is a 39 year old woman with a history of chronic mental health disorder.  Variously diagnosed in the chart as schizoaffective or bipolar.  Given the chronicity I would say that schizoaffective is probably a better fit.  Currently she is angry and irritable but has not been threatening and has displayed no dangerous behavior in the hospital and denies any suicidal thought.  She knows she is pregnant and has an understanding of what appropriate treatment is  for delivery and articulates a plan to follow through with presentation to [facility] for appropriate care in the last couple weeks of her pregnancy.  She declines to take any psychiatric medicine at this point and is able to articulate that 1 reason is she does not want any risks to her unborn child.  At this point although she is clearly symptomatic with inappropriate affect and thought patterns, it is not evident that she is acutely dangerous to herself or others.  There is not appear to be a justification for continuing to force hospitalization or medication against the patient's will.  Not likely to be of any lasting benefit to her or the unborn child.  Instead the patient is requesting to go back to [place].  Declines to share with me where she will be staying but says she does have a safe place to live.  Patient has been urged to strongly consider follow-up outpatient mental health care at least after delivery.  Case reviewed with treatment team including nursing and social work.  Observation Level/Precautions:  15 minute checks  Laboratory:  Patient had a test this morning of fluid that she was leaking which was negative for amniotic fluid.  She was  seen also by OB/GYN who confirmed that she does not seem to broken her amniotic bag or to be in labor  Psychotherapy:    Medications:    Consultations:    Discharge Concerns:    Estimated LOS:  Other:     Physician Treatment Plan for Primary Diagnosis: Schizoaffective disorder, bipolar type ([place]) Long Term Goal(s): Improvement in symptoms so as ready for discharge  Short Term Goals: Ability to demonstrate self-control will improve  Physician Treatment Plan for Secondary Diagnosis: Principal Problem:   Schizoaffective disorder, bipolar type ([place]) Active Problems:   Third trimester pregnancy  Long Term Goal(s): Improvement in symptoms so as ready for discharge  Short Term Goals: Ability to identify and develop effective coping behaviors  will improve  I certify that inpatient services furnished can reasonably be expected to improve the patient's condition.    Alethia Berthold, MD 6/30/20212:00 PM

## 2019-07-06 NOTE — Progress Notes (Signed)
MD ordered Lorazepam fo r anxiety and restlessness but patient refused it upon looking at her chart it was noted on her allergy list, will not enter in verbal order.

## 2019-07-06 NOTE — Progress Notes (Signed)
Patient is agitated and in an irritable mood at this encounter. She refused her prescribed medication, stating she does not like how medication makes her feel. She denies having any pain. She denies SI/HI/AVH anxiety and depression and was angered with the questioning. She was hyper fixated on getting her clothes out of the dryer and despite the clothes being wet to touch insisted on taking the clothes out of the dryer and taking them to her room. Patient remains safe on the unit with 15 minute safety checks and informed to contact staff with any concerns.    Cleo Butler-Nicholson, LPN

## 2019-07-06 NOTE — Progress Notes (Signed)
Pt denies SI, Hi and AVH. Pt received prescriptions, belongings and dc packet. Pt was educated on care plan and verbalizes understanding.Collier Bullock RN

## 2019-07-06 NOTE — Progress Notes (Signed)
Notified by CN [place] that this patient communicated that her water had broke. Per Britt Bolognese she was unable to reach night NP provider and had attempted contact with Dr. Weber Cooks and was waiting on a return call. Instructed her that I would reach out to [facility] and would be down.   Talked with Charge Nurse Sam on 3rd floor who said they could perform a ROM plus to confirm ROM once the test was ordered and patient could be accommodated should this test be positive or patient begin to have other symptoms.   Upon arrival to BMU patient was walking halls and asking to sign out. Patient denied any contractions but unable to reliably answer as to whether she felt baby movement or whether fluids were still leaking. This was communicated to [facility] staff. Contacted Dr. Weber Cooks to request order for ROM plus and to give [facility] provider name and number to be contacted for admission. Also discussed need for patient to have sitter once sent to 3rd floor. OB charge nurse aware of conversation with Dr. Weber Cooks and plan.   OB staff arrived to swab patient. Both BMU RN's and security present and patient taken into treatment room to have specimen collected.   This RN walked specimen to lab for processing.

## 2019-07-06 NOTE — BHH Suicide Risk Assessment (Signed)
[place] INPATIENT:  Family/Significant Other Suicide Prevention Education  Suicide Prevention Education:  Patient Refusal for Family/Significant Other Suicide Prevention Education: The patient [name] [name] has refused to provide written consent for family/significant other to be provided Family/Significant Other Suicide Prevention Education during admission and/or prior to discharge.  Physician notified.  SPE completed with pt, as pt refused to consent to family contact. SPI pamphlet provided to pt and pt was encouraged to share information with support network, ask questions, and talk about any concerns relating to SPE. Pt denies access to guns/firearms and verbalized understanding of information provided. Mobile Crisis information also provided to pt.   Rozann Lesches 07/06/2019, 2:06 PM

## 2019-07-06 NOTE — BHH Counselor (Signed)
CSW met with the patient to address aftercare plans. Patient declined aftercare at this time.   Patient declined SPE contact with collaterals at this time.  SPE completed with the patient.  Assunta Curtis, MSW, LCSW 07/06/2019 2:03 PM

## 2019-07-06 NOTE — Discharge Summary (Signed)
Physician Discharge Summary Note  Patient:  [name] [name] is an 39 y.o., female MRN:  [id number] DOB:  Oct 13, 1980 Patient phone:  [phone] (home)  Patient address:   [address] [place] [address],  Total Time spent with patient: 1 hour  Date of Admission:  07/05/2019 Date of Discharge: 07/06/2019  Reason for Admission: 39 year old woman with a history of chronic mental health issues was transferred to [place] from [facility] for mental health treatment and obstetric treatment because of current symptoms of psychosis  Principal Problem: Schizoaffective disorder, bipolar type [facility]) Discharge Diagnoses: Principal Problem:   Schizoaffective disorder, bipolar type ([place]) Active Problems:   Third trimester pregnancy   Past Psychiatric History: Multiple contacts with mental health treatment over the last several years.  Not clear to me if she is ever had any lengthy hospitalizations.  Many times it looks like she has been in the emergency room and then either left on her own or been ultimately discharged.  Patient has a presentation most consistent probably with schizoaffective disorder.  She absolutely denies any history of suicidal or homicidal behavior  Past Medical History:  Past Medical History:  Diagnosis Date  . Bipolar affective disorder, currently manic, mild ([place])   . Diabetes mellitus without complication ([place])   . Schizophrenia [facility])     Past Surgical History:  Procedure Laterality Date  . CESAREAN SECTION  04/2018  . WISDOM TOOTH EXTRACTION     Family History:  Family History  Problem Relation Age of Onset  . Drug abuse Maternal Uncle    Family Psychiatric  History: Will not answer questions like this Social History:  Social History   Substance and Sexual Activity  Alcohol Use No     Social History   Substance and Sexual Activity  Drug Use No    Social History   Socioeconomic History  . Marital status: Legally Separated    Spouse name: Not on  file  . Number of children: Not on file  . Years of education: Not on file  . Highest education level: Not on file  Occupational History  . Not on file  Tobacco Use  . Smoking status: Never Smoker  . Smokeless tobacco: Never Used  Vaping Use  . Vaping Use: Never used  Substance and Sexual Activity  . Alcohol use: No  . Drug use: No  . Sexual activity: Not Currently  Other Topics Concern  . Not on file  Social History Narrative  . Not on file   Social Determinants of Health   Financial Resource Strain:   . Difficulty of Paying Living Expenses:   Food Insecurity:   . Worried About Charity fundraiser in the Last Year:   . Arboriculturist in the Last Year:   Transportation Needs:   . Film/video editor (Medical):   Marland Kitchen Lack of Transportation (Non-Medical):   Physical Activity:   . Days of Exercise per Week:   . Minutes of Exercise per Session:   Stress:   . Feeling of Stress :   Social Connections:   . Frequency of Communication with Friends and Family:   . Frequency of Social Gatherings with Friends and Family:   . Attends Religious Services:   . Active Member of Clubs or Organizations:   . Attends Archivist Meetings:   Marland Kitchen Marital Status:     Hospital Course: Patient admitted to the psychiatric ward.  Since being here she has not been physically violent to anyone has  not been physically threatening to anyone and has not voiced any suicidal ideation.  At times she has been loud and argumentative with staff but has not become physically aggressive.  Patient shows disorganized thoughts with paranoia.  Does not appear to be clearly responding to internal stimuli.  Patient's primary focus has been follow-up obstetric care.  Early this morning she told staff that she had "broken her water".  She was not having any contractions.  A point-of-care test was done for amniotic fluid which was negative.  Patient was seen in consultation by the midwife who was able to do an  exam which confirmed that the patient had not ruptured her amniotic sac and did not appear to be in active labor.  Patient is requesting discharge from the hospital.  She states that she does not believe she has a mental illness and she will not take any psychiatric medication.  At this point there does not seem to be clear evidence that she is dangerous in a sense that would justify forced hospitalization or forced medicine.  Patient articulates an appropriate plan to follow-up with obstetric care for delivery.  Appears to be able to make decisions to care for her basic needs outside the hospital.  Case reviewed with treatment team but patient will be discharged today and given encouragement to consider follow-up with appropriate outpatient treatment after discharge  Physical Findings: AIMS:  , ,  ,  ,    CIWA:    COWS:     Musculoskeletal: Strength & Muscle Tone: within normal limits Gait & Station: normal Patient leans: N/A  Psychiatric Specialty Exam: Physical Exam Vitals and nursing note reviewed.  Constitutional:      Appearance: She is well-developed.  HENT:     Head: Normocephalic and atraumatic.  Eyes:     Conjunctiva/sclera: Conjunctivae normal.     Pupils: Pupils are equal, round, and reactive to light.  Cardiovascular:     Heart sounds: Normal heart sounds.  Pulmonary:     Effort: Pulmonary effort is normal.  Abdominal:     Palpations: Abdomen is soft.  Musculoskeletal:        General: Normal range of motion.     Cervical back: Normal range of motion.  Skin:    General: Skin is warm and dry.  Neurological:     General: No focal deficit present.     Mental Status: She is alert.  Psychiatric:        Attention and Perception: She is inattentive.        Mood and Affect: Affect is labile and angry.        Speech: Speech is rapid and pressured.        Behavior: Behavior is agitated.        Thought Content: Thought content is paranoid. Thought content does not include  homicidal or suicidal ideation.        Cognition and Memory: Cognition is impaired.        Judgment: Judgment is impulsive and inappropriate.     Review of Systems  Constitutional: Negative.   HENT: Negative.   Eyes: Negative.   Respiratory: Negative.   Cardiovascular: Negative.   Gastrointestinal: Negative.   Musculoskeletal: Negative.   Skin: Negative.   Neurological: Negative.   Psychiatric/Behavioral: Negative.        Patient does not view herself as symptomatic    Blood pressure 116/75, pulse 100, temperature 98.9 F (37.2 C), temperature source Oral, resp. rate 18, height 5\' 4"  (  1.626 m), weight 92.9 kg, SpO2 100 %.Body mass index is 35.16 kg/m.  General Appearance: Casual  Eye Contact:  Good  Speech:  Pressured  Volume:  Increased  Mood:  Angry and Irritable  Affect:  Inappropriate and Labile  Thought Process:  Coherent and Disorganized  Orientation:  Full (Time, Place, and Person)  Thought Content:  Illogical, Paranoid Ideation and Tangential  Suicidal Thoughts:  No  Homicidal Thoughts:  No  Memory:  Immediate;   Fair Recent;   Fair Remote;   Fair  Judgement:  Impaired  Insight:  Shallow  Psychomotor Activity:  Restlessness  Concentration:  Concentration: Fair  Recall:  [place] of Knowledge:  Fair  Language:  Fair  Akathisia:  No  Handed:  Right  AIMS (if indicated):     Assets:  Desire for Improvement Housing Physical Health Resilience  ADL's:  Intact  Cognition:  Impaired,  Mild  Sleep:  Number of Hours: 4     Have you used any form of tobacco in the last 30 days? (Cigarettes, Smokeless Tobacco, Cigars, and/or Pipes): No  Has this patient used any form of tobacco in the last 30 days? (Cigarettes, Smokeless Tobacco, Cigars, and/or Pipes) Yes, No  Blood Alcohol level:  Lab Results  Component Value Date   ETH <10 11/19/2017   ETH <10 93/71/6967    Metabolic Disorder Labs:  Lab Results  Component Value Date   HGBA1C 5.7 (H) 07/04/2019    MPG 116.89 07/04/2019   MPG 97 10/07/2017   Lab Results  Component Value Date   PROLACTIN 46.5 (H) 07/25/2015   PROLACTIN 21.6 02/01/2015   Lab Results  Component Value Date   CHOL 182 10/07/2017   TRIG 46 10/07/2017   HDL 65 10/07/2017   CHOLHDL 2.8 10/07/2017   VLDL 9 10/07/2017   LDLCALC 108 (H) 10/07/2017   LDLCALC 73 07/25/2015    See Psychiatric Specialty Exam and Suicide Risk Assessment completed by Attending Physician prior to discharge.  Discharge destination:  Home  Is patient on multiple antipsychotic therapies at discharge:  No   Has Patient had three or more failed trials of antipsychotic monotherapy by history:  No  Recommended Plan for Multiple Antipsychotic Therapies: NA  Discharge Instructions    Diet - low sodium heart healthy   Complete by: As directed    Increase activity slowly   Complete by: As directed      Allergies as of 07/06/2019      Reactions   Quetiapine Anaphylaxis   "Pass out"   Gabapentin Other (See Comments)   "Feet on fire"   Lorazepam    Risperidone Other (See Comments)   Pt reports "It makes me blind"   Valproic Acid Other (See Comments)   Pt reports "it makes me too sleepy"   Aripiprazole Other (See Comments)   Makes patient "too tired"   Fluphenazine Rash   Haloperidol Other (See Comments)   Makes feel hot inside. "Tires my tongue"   Trazodone Other (See Comments)   Ziprasidone Hcl Palpitations      Medication List    TAKE these medications     Indication  prenatal multivitamin Tabs tablet Take 1 tablet by mouth daily at 12 noon.  Indication: Pregnancy       Follow-up Information    Patient declined Follow up.   Why: Patient declined aftercare at this time. Thanks! Contact information: Patient declined  Follow-up recommendations:  Activity:  Activity as tolerated Diet:  Regular diet Other:  Reviewed with patient symptoms of labor and encouraged her to make sure she got appropriate  obstetric follow-up.  Encourage patient to consider mental health treatment longer term.  Comments: Only prescription is for prenatal vitamin  Signed: Alethia Berthold, MD 07/06/2019, 2:15 PM

## 2019-07-06 NOTE — Plan of Care (Signed)
°  Problem: Activity: Goal: Will verbalize the importance of balancing activity with adequate rest periods Outcome: Not Progressing   Problem: Education: Goal: Will be free of psychotic symptoms Outcome: Not Progressing Goal: Knowledge of the prescribed therapeutic regimen will improve Outcome: Not Progressing   Problem: Coping: Goal: Coping ability will improve Outcome: Not Progressing Goal: Will verbalize feelings Outcome: Not Progressing   Problem: Health Behavior/Discharge Planning: Goal: Compliance with prescribed medication regimen will improve Outcome: Not Progressing   Problem: Nutritional: Goal: Ability to achieve adequate nutritional intake will improve Outcome: Not Progressing   Problem: Role Relationship: Goal: Ability to communicate needs accurately will improve Outcome: Not Progressing Goal: Ability to interact with others will improve Outcome: Not Progressing   Problem: Safety: Goal: Ability to redirect hostility and anger into socially appropriate behaviors will improve Outcome: Not Progressing Goal: Ability to remain free from injury will improve Outcome: Not Progressing   Problem: Self-Care: Goal: Ability to participate in self-care as condition permits will improve Outcome: Not Progressing   Problem: Self-Concept: Goal: Will verbalize positive feelings about self Outcome: Not Progressing   Problem: Education: Goal: Knowledge of [place] [facility] Education information/materials will improve Outcome: Not Progressing Goal: Emotional status will improve Outcome: Not Progressing Goal: Mental status will improve Outcome: Not Progressing Goal: Verbalization of understanding the information provided will improve Outcome: Not Progressing   Problem: Activity: Goal: Interest or engagement in activities will improve Outcome: Not Progressing Goal: Sleeping patterns will improve Outcome: Not Progressing   Problem: Coping: Goal: Ability to verbalize  frustrations and anger appropriately will improve Outcome: Not Progressing Goal: Ability to demonstrate self-control will improve Outcome: Not Progressing   Problem: Health Behavior/Discharge Planning: Goal: Identification of resources available to assist in meeting health care needs will improve Outcome: Not Progressing Goal: Compliance with treatment plan for underlying cause of condition will improve Outcome: Not Progressing   Problem: Physical Regulation: Goal: Ability to maintain clinical measurements within normal limits will improve Outcome: Not Progressing   Problem: Safety: Goal: Periods of time without injury will increase Outcome: Not Progressing

## 2019-07-06 NOTE — Progress Notes (Signed)
  [facility] Adult Case Management Discharge Plan :  Will you be returning to the same living situation after discharge:  Yes,  pt reports that she is returning home.  At discharge, do you have transportation home?: Yes,  CSW will assist with transportation needs. Do you have the ability to pay for your medications: Yes,  Medicare Part A and B.  Release of information consent forms completed and in the chart;  Patient's signature needed at discharge.  Patient to Follow up at:  Follow-up Information    Patient declined Follow up.   Why: Patient declined aftercare at this time. Thanks! Contact information: Patient declined              Next level of care provider has access to [place] and Suicide Prevention discussed: No.  Have you used any form of tobacco in the last 30 days? (Cigarettes, Smokeless Tobacco, Cigars, and/or Pipes): No  Has patient been referred to the Quitline?: Patient refused referral  Patient has been referred for addiction treatment: Pt. refused referral  Rozann Lesches, LCSW 07/06/2019, 2:04 PM

## 2019-07-06 NOTE — Progress Notes (Signed)
Recreation Therapy Notes    Date: 07/06/2019   Time: 9:30 am   Location: Room 21   Behavioral response: N/A   Intervention Topic: Communication   Discussion/Intervention: Patient did not attend group.   Clinical Observations/Feedback:  Patient did not attend group.   Gemma Ruan LRT/CTRS        Latesia Norrington 07/06/2019 1:19 PM

## 2019-07-06 NOTE — Progress Notes (Signed)
Checked on and pt has no complaints.Denies cramping and pain. Spoke with [facility] and they are waiting on test results to see if pt will be taken to third floor. Collier Bullock RN

## 2019-07-06 NOTE — BHH Suicide Risk Assessment (Signed)
[facility] Discharge Suicide Risk Assessment   Principal Problem: Schizoaffective disorder, bipolar type [facility]) Discharge Diagnoses: Principal Problem:   Schizoaffective disorder, bipolar type ([place]) Active Problems:   Third trimester pregnancy   Total Time spent with patient: 1 hour  Musculoskeletal: Strength & Muscle Tone: within normal limits Gait & Station: normal Patient leans: N/A  Psychiatric Specialty Exam: Review of Systems  Constitutional: Negative.   HENT: Negative.   Eyes: Negative.   Respiratory: Negative.   Cardiovascular: Negative.   Gastrointestinal: Negative.   Musculoskeletal: Negative.   Skin: Negative.   Neurological: Negative.   Psychiatric/Behavioral: Negative.        Patient does not feel that she is symptomatic    Blood pressure 116/75, pulse 100, temperature 98.9 F (37.2 C), temperature source Oral, resp. rate 18, height 5\' 4"  (1.626 m), weight 92.9 kg, SpO2 100 %.Body mass index is 35.16 kg/m.  General Appearance: Casual  Eye Contact::  Good  Speech:  Pressured409  Volume:  Increased  Mood:  Angry and Irritable  Affect:  Inappropriate  Thought Process:  Disorganized  Orientation:  Full (Time, Place, and Person)  Thought Content:  Illogical, Paranoid Ideation and Tangential  Suicidal Thoughts:  No  Homicidal Thoughts:  No  Memory:  Immediate;   Fair Recent;   Fair Remote;   Fair  Judgement:  Impaired  Insight:  Shallow  Psychomotor Activity:  Restlessness  Concentration:  Fair  Recall:  [employer] of Knowledge:Fair  Language: Fair  Akathisia:  No  Handed:  Right  AIMS (if indicated):     Assets:  Desire for Improvement Resilience  Sleep:  Number of Hours: 4  Cognition: WNL  ADL's:  Intact   Mental Status Per Nursing Assessment::   On Admission:  NA  Demographic Factors:  NA  Loss Factors: NA  Historical Factors: Impulsivity  Risk Reduction Factors:   Pregnancy and Positive coping skills or problem solving  skills  Continued Clinical Symptoms:  Schizophrenia:   Paranoid or undifferentiated type  Cognitive Features That Contribute To Risk:  Thought constriction (tunnel vision)    Suicide Risk:  Minimal: No identifiable suicidal ideation.  Patients presenting with no risk factors but with morbid ruminations; may be classified as minimal risk based on the severity of the depressive symptoms   Follow-up Information    Patient declined Follow up.   Why: Patient declined aftercare at this time. Thanks! Contact information: Patient declined              Plan Of Care/Follow-up recommendations:  Activity:  Activity as tolerated Diet:  Regular diet Other:  Encourage patient to consider psychiatric follow-up in the future especially after delivery  Alethia Berthold, MD 07/06/2019, 2:11 PM

## 2019-07-06 NOTE — Consult Note (Addendum)
S: I was asked to see patient for evaluation of complaint of leaking fluid. Her gestational age today is 38 weeks 4 days based on ultrasound done 3 days ago. She was transferred from [facility] yesterday for admission in [place] at [facility]. NOB labs and first prenatal care were done in [place] 3 days ago. She has a history of primary c/section in 2020 and prefers repeat c/section per note from Dr Ilda Basset. It is her understanding that her c/section should be scheduled asap. Upon my entering her room, patient is sitting on her bed and talking with Dr Weber Cooks. She is calm, however is mildly agitated by certain questions and therefore does not answer. She reports a large amount of fluid this morning- more than she ever experienced with her previous pregnancy(s). Patient mentions a previous miscarriage, however this is not reflected in her obstetric record. She reports fetal movement and denies vaginal bleeding. She states she has pain and is not able to describe the onset, quality, or frequency. She denies headache. Initially she declines vaginal exam since a swab was just done earlier in the morning. She then agrees to cervical exam to determine if she has rupture of membranes. She also requests that I look for yeast on the specimen.   O: Vital Signs: BP 116/75 (BP Location: Left Arm)   Pulse 100   Temp 98.9 F (37.2 C) (Oral)   Resp 18   Ht 5\' 4"  (1.626 m)   Wt 92.9 kg   SpO2 100%   BMI 35.16 kg/m  Constitutional: Well nourished, well developed female in no acute distress.  HEENT: normal Skin: Warm and dry.    Extremity: +1 edema lower extremities  Respiratory:  Normal respiratory effort Abdomen: gravid Neuro: DTRs 2+, Cranial nerves grossly intact Psych: Alert  MS: normal gait, walks with a cane  Pelvic exam: (female chaperone present) is not limited by body habitus EGBUS: within normal limits Vagina: within normal limits and with normal mucosa, normal appearing thin white  discharge Cervix: sterile speculum exam; negative for pooling, cervix appears to be closed. She declines digital exam.  Specimen negative for ferning or yeast.  A: 39 y.o. G2 P19 female with IUP at 38 weeks, admitted to behavioral health for further mental health care, prenatal care was initiated 2 days ago at [facility], previous c/section, no evidence of rupture of membranes or labor  P: Patient was discharged from [facility] this afternoon per her preference. She will follow up for delivery planning in [place].  Christean Leaf, CNM [facility] 07/06/2019, 4:10 PM

## 2019-07-06 NOTE — BHH Counselor (Signed)
Adult Comprehensive Assessment  Patient ID: [name] [name], female   DOB: 08-Mar-1980, 39 y.o.   MRN: [id number]  Information Source:    Current Stressors:     Living/Environment/Situation:  Living Arrangements: Other (Comment) ("family")  Family History:     Childhood History:     Education:     Employment/Work Situation:      Pensions consultant:      Alcohol/Substance Abuse:      Social Support System:      Leisure/Recreation:      Strengths/Needs:      Discharge Plan:      Summary/Recommendations:   Architectural technologist and Recommendations (to be completed by the evaluator): Patient is a 39 year old female from [place] [place] [facility]   She presents to the hospital following concerns for disorganized thinking, history of Bipolar, Schizophrenia and diabetes and not maintaining her prenatal care.  She has a primary diagnosis of Schizoaffective Disorder, Bipolar Type.     Recommendations include: crisis stabilization, therapeutic milieu, encourage group attendance and participation, medication management for detox/mood stabilization and development of comprehensive mental wellness/sobriety plan.  [name] [name]. 07/06/2019

## 2019-07-06 NOTE — BHH Group Notes (Signed)
LCSW Group Therapy Note  07/06/2019 2:55 PM  Type of Therapy/Topic:  Group Therapy:  Emotion Regulation  Participation Level:  Did Not Attend   Description of Group:   The purpose of this group is to assist patients in learning to regulate negative emotions and experience positive emotions. Patients will be guided to discuss ways in which they have been vulnerable to their negative emotions. These vulnerabilities will be juxtaposed with experiences of positive emotions or situations, and patients will be challenged to use positive emotions to combat negative ones. Special emphasis will be placed on coping with negative emotions in conflict situations, and patients will process healthy conflict resolution skills.  Therapeutic Goals: 1. Patient will identify two positive emotions or experiences to reflect on in order to balance out negative emotions 2. Patient will label two or more emotions that they find the most difficult to experience 3. Patient will demonstrate positive conflict resolution skills through discussion and/or role plays  Summary of Patient Progress: X  Therapeutic Modalities:   Cognitive Behavioral Therapy Feelings Identification Dialectical Behavioral Therapy  Assunta Curtis, MSW, LCSW 07/06/2019 2:55 PM

## 2019-07-06 NOTE — BHH Suicide Risk Assessment (Signed)
[facility] Admission Suicide Risk Assessment   Nursing information obtained from:  Patient Demographic factors:  Female Current Mental Status:  NA Loss Factors:  NA Historical Factors:  NA Risk Reduction Factors:  NA  Total Time spent with patient: 1 hour Principal Problem: Schizoaffective disorder, bipolar type ([place]) Diagnosis:  Principal Problem:   Schizoaffective disorder, bipolar type ([place]) Active Problems:   Third trimester pregnancy  Subjective Data: Patient seen and chart reviewed.  39 year old woman with a history of chronic mental illness who is approximately [redacted] weeks pregnant.  She was transferred to [place] from [facility] for mental health care.  Patient states that she does not believe she has any mental health problems.  She denies any suicidal or homicidal ideation.  She has not been threatening or violent on the unit.  She is categorically refusing to take any psychiatric medicine or engage in any further psychiatric evaluation or treatment and is requesting discharge.  No evidence of any suicidal or dangerous behavior prior to admission  Continued Clinical Symptoms:  Alcohol Use Disorder Identification Test Final Score (AUDIT): 0 The "Alcohol Use Disorders Identification Test", Guidelines for Use in Primary Care, Second Edition.  World Pharmacologist [facility]). Score between 0-7:  no or low risk or alcohol related problems. Score between 8-15:  moderate risk of alcohol related problems. Score between 16-19:  high risk of alcohol related problems. Score 20 or above:  warrants further diagnostic evaluation for alcohol dependence and treatment.   CLINICAL FACTORS:   Schizophrenia:   Paranoid or undifferentiated type   Musculoskeletal: Strength & Muscle Tone: within normal limits Gait & Station: normal Patient leans: N/A  Psychiatric Specialty Exam: Physical Exam Vitals and nursing note reviewed.  Constitutional:      Appearance: She is well-developed.  HENT:     Head:  Normocephalic and atraumatic.  Eyes:     Conjunctiva/sclera: Conjunctivae normal.     Pupils: Pupils are equal, round, and reactive to light.  Cardiovascular:     Heart sounds: Normal heart sounds.  Pulmonary:     Effort: Pulmonary effort is normal.  Abdominal:     General: There is distension.     Comments: Patient is [redacted] weeks pregnant and looks it.  Musculoskeletal:        General: Normal range of motion.     Cervical back: Normal range of motion.  Skin:    General: Skin is warm and dry.  Neurological:     General: No focal deficit present.     Mental Status: She is alert.  Psychiatric:        Attention and Perception: She is inattentive.        Mood and Affect: Affect is angry and inappropriate.        Speech: Speech is rapid and pressured.        Behavior: Behavior is agitated.        Thought Content: Thought content is paranoid. Thought content does not include homicidal or suicidal ideation.        Cognition and Memory: Memory is impaired.        Judgment: Judgment is impulsive and inappropriate.     Review of Systems  Constitutional: Negative.   HENT: Negative.   Eyes: Negative.   Respiratory: Negative.   Cardiovascular: Negative.   Gastrointestinal: Negative.   Musculoskeletal: Negative.   Skin: Negative.   Neurological: Negative.   Psychiatric/Behavioral: Negative.        Patient does not see herself as having any psychiatric  concerns and does not endorse any complaints    Blood pressure 116/75, pulse 100, temperature 98.9 F (37.2 C), temperature source Oral, resp. rate 18, height 5\' 4"  (1.626 m), weight 92.9 kg, SpO2 100 %.Body mass index is 35.16 kg/m.  General Appearance: Casual  Eye Contact:  Fair  Speech:  Pressured  Volume:  Increased  Mood:  Irritable  Affect:  Inappropriate  Thought Process:  Disorganized  Orientation:  Full (Time, Place, and Person)  Thought Content:  Paranoid Ideation and Tangential  Suicidal Thoughts:  No  Homicidal  Thoughts:  No  Memory:  Immediate;   Fair Recent;   Poor Remote;   Poor  Judgement:  Impaired  Insight:  Lacking  Psychomotor Activity:  Normal  Concentration:  Concentration: Poor  Recall:  Poor  Fund of Knowledge:  Fair  Language:  Fair  Akathisia:  No  Handed:  Right  AIMS (if indicated):     Assets:  Housing Resilience  ADL's:  Intact  Cognition:  Impaired,  Mild  Sleep:  Number of Hours: 4      COGNITIVE FEATURES THAT CONTRIBUTE TO RISK:  Thought constriction (tunnel vision)    SUICIDE RISK:   Minimal: No identifiable suicidal ideation.  Patients presenting with no risk factors but with morbid ruminations; may be classified as minimal risk based on the severity of the depressive symptoms  PLAN OF CARE: Patient is currently showing signs of disordered thinking and is agitated and inappropriate in her affect but her behavior has not shown any threats towards others and she has not made any suicidal statements or movements.  She is requesting discharge and states that she does intend to follow-up with appropriate obstetric care to deliver the baby.  She is declining any psychiatric treatment.  Patient is going to be discharged with encouragement that she reconsider psychiatric treatment in the future.  No evidence currently of acute dangerousness.  I certify that inpatient services furnished can reasonably be expected to improve the patient's condition.   Alethia Berthold, MD 07/06/2019, 1:55 PM

## 2019-07-06 NOTE — Plan of Care (Signed)
Pt rates depression 9/10. Pt denies SI, HI and AVH. Pt was educated on care plan and verbalizes understanding. Pt was encouraged to attend groups. Collier Bullock RN Problem: Activity: Goal: Will verbalize the importance of balancing activity with adequate rest periods Outcome: Not Progressing   Problem: Education: Goal: Will be free of psychotic symptoms Outcome: Not Progressing Goal: Knowledge of the prescribed therapeutic regimen will improve Outcome: Not Progressing   Problem: Coping: Goal: Coping ability will improve Outcome: Not Progressing Goal: Will verbalize feelings Outcome: Not Progressing   Problem: Health Behavior/Discharge Planning: Goal: Compliance with prescribed medication regimen will improve Outcome: Not Progressing   Problem: Nutritional: Goal: Ability to achieve adequate nutritional intake will improve Outcome: Not Progressing   Problem: Role Relationship: Goal: Ability to communicate needs accurately will improve Outcome: Not Progressing Goal: Ability to interact with others will improve Outcome: Not Progressing   Problem: Safety: Goal: Ability to redirect hostility and anger into socially appropriate behaviors will improve Outcome: Not Progressing Goal: Ability to remain free from injury will improve Outcome: Not Progressing   Problem: Self-Care: Goal: Ability to participate in self-care as condition permits will improve Outcome: Not Progressing   Problem: Self-Concept: Goal: Will verbalize positive feelings about self Outcome: Not Progressing   Problem: Education: Goal: Knowledge of [place] [facility] Education information/materials will improve Outcome: Not Progressing Goal: Emotional status will improve Outcome: Not Progressing Goal: Mental status will improve Outcome: Not Progressing Goal: Verbalization of understanding the information provided will improve Outcome: Not Progressing   Problem: Activity: Goal: Interest or engagement in  activities will improve Outcome: Not Progressing Goal: Sleeping patterns will improve Outcome: Not Progressing   Problem: Coping: Goal: Ability to verbalize frustrations and anger appropriately will improve Outcome: Not Progressing Goal: Ability to demonstrate self-control will improve Outcome: Not Progressing   Problem: Health Behavior/Discharge Planning: Goal: Identification of resources available to assist in meeting health care needs will improve Outcome: Not Progressing Goal: Compliance with treatment plan for underlying cause of condition will improve Outcome: Not Progressing   Problem: Physical Regulation: Goal: Ability to maintain clinical measurements within normal limits will improve Outcome: Not Progressing   Problem: Safety: Goal: Periods of time without injury will increase Outcome: Not Progressing

## 2019-07-08 LAB — BPAM RBC
Blood Product Expiration Date: 202107152359
Blood Product Expiration Date: 202107312359
ISSUE DATE / TIME: 202106301503
Unit Type and Rh: 5100
Unit Type and Rh: 5100

## 2019-07-08 LAB — TYPE AND SCREEN
ABO/RH(D): O POS
Antibody Screen: POSITIVE
Donor AG Type: NEGATIVE
Unit division: 0
Unit division: 0

## 2019-07-10 ENCOUNTER — Encounter (HOSPITAL_COMMUNITY): Payer: Self-pay | Admitting: Family Medicine

## 2019-07-10 ENCOUNTER — Inpatient Hospital Stay (HOSPITAL_COMMUNITY)
Admission: EM | Admit: 2019-07-10 | Discharge: 2019-07-13 | DRG: 807 | Disposition: A | Discharge status: Home / Self Care | Payer: Medicare Other | Attending: Family Medicine | Admitting: Family Medicine

## 2019-07-10 ENCOUNTER — Other Ambulatory Visit: Payer: Self-pay

## 2019-07-10 ENCOUNTER — Encounter (HOSPITAL_COMMUNITY): Payer: Self-pay | Admitting: Anesthesiology

## 2019-07-10 DIAGNOSIS — O0933 Supervision of pregnancy with insufficient antenatal care, third trimester: Secondary | ICD-10-CM | POA: Diagnosis not present

## 2019-07-10 DIAGNOSIS — O99344 Other mental disorders complicating childbirth: Secondary | ICD-10-CM | POA: Diagnosis present

## 2019-07-10 DIAGNOSIS — O34211 Maternal care for low transverse scar from previous cesarean delivery: Secondary | ICD-10-CM | POA: Diagnosis not present

## 2019-07-10 DIAGNOSIS — O99343 Other mental disorders complicating pregnancy, third trimester: Secondary | ICD-10-CM | POA: Diagnosis not present

## 2019-07-10 DIAGNOSIS — R451 Restlessness and agitation: Secondary | ICD-10-CM | POA: Diagnosis not present

## 2019-07-10 DIAGNOSIS — Z3A39 39 weeks gestation of pregnancy: Secondary | ICD-10-CM

## 2019-07-10 DIAGNOSIS — Z20822 Contact with and (suspected) exposure to covid-19: Secondary | ICD-10-CM | POA: Diagnosis present

## 2019-07-10 DIAGNOSIS — O34219 Maternal care for unspecified type scar from previous cesarean delivery: Secondary | ICD-10-CM | POA: Diagnosis present

## 2019-07-10 DIAGNOSIS — O479 False labor, unspecified: Secondary | ICD-10-CM

## 2019-07-10 DIAGNOSIS — F250 Schizoaffective disorder, bipolar type: Secondary | ICD-10-CM | POA: Diagnosis present

## 2019-07-10 DIAGNOSIS — O99214 Obesity complicating childbirth: Secondary | ICD-10-CM | POA: Diagnosis present

## 2019-07-10 DIAGNOSIS — O134 Gestational [pregnancy-induced] hypertension without significant proteinuria, complicating childbirth: Secondary | ICD-10-CM | POA: Diagnosis present

## 2019-07-10 DIAGNOSIS — O24113 Pre-existing diabetes mellitus, type 2, in pregnancy, third trimester: Secondary | ICD-10-CM | POA: Diagnosis not present

## 2019-07-10 DIAGNOSIS — O133 Gestational [pregnancy-induced] hypertension without significant proteinuria, third trimester: Secondary | ICD-10-CM | POA: Diagnosis not present

## 2019-07-10 DIAGNOSIS — O26893 Other specified pregnancy related conditions, third trimester: Secondary | ICD-10-CM | POA: Diagnosis present

## 2019-07-10 LAB — SARS CORONAVIRUS 2 BY RT PCR (HOSPITAL ORDER, PERFORMED IN ~~LOC~~ HOSPITAL LAB): SARS Coronavirus 2: NEGATIVE

## 2019-07-10 MED ORDER — PRENATAL MULTIVITAMIN CH
1.0000 | ORAL_TABLET | Freq: Every day | ORAL | Status: DC
Start: 2019-07-11 — End: 2019-07-10

## 2019-07-10 MED ORDER — SOD CITRATE-CITRIC ACID 500-334 MG/5ML PO SOLN: 30.0000 mL | ORAL | Status: DC | PRN

## 2019-07-10 MED ORDER — ONDANSETRON HCL 4 MG/2ML IJ SOLN: 4.0000 mg | INTRAMUSCULAR | Status: DC | PRN

## 2019-07-10 MED ORDER — ONDANSETRON HCL 4 MG/2ML IJ SOLN: 4.0000 mg | Freq: Four times a day (QID) | INTRAMUSCULAR | Status: DC | PRN

## 2019-07-10 MED ORDER — COCONUT OIL OIL: 1.0000 "application " | TOPICAL_OIL | Status: DC | PRN

## 2019-07-10 MED ORDER — IBUPROFEN 600 MG PO TABS
600.0000 mg | ORAL_TABLET | Freq: Four times a day (QID) | ORAL | Status: DC
  Filled 2019-07-10 (×2): qty 1

## 2019-07-10 MED ORDER — WITCH HAZEL-GLYCERIN EX PADS: 1.0000 "application " | MEDICATED_PAD | CUTANEOUS | Status: DC | PRN

## 2019-07-10 MED ORDER — BENZOCAINE-MENTHOL 20-0.5 % EX AERO: 1.0000 "application " | INHALATION_SPRAY | CUTANEOUS | Status: DC | PRN

## 2019-07-10 MED ORDER — OXYTOCIN 10 UNIT/ML IJ SOLN
INTRAMUSCULAR | Status: AC
  Administered 2019-07-10: 10 [IU]
  Filled 2019-07-10: qty 1

## 2019-07-10 MED ORDER — TETANUS-DIPHTH-ACELL PERTUSSIS 5-2.5-18.5 LF-MCG/0.5 IM SUSP: 0.5000 mL | Freq: Once | INTRAMUSCULAR | Status: DC

## 2019-07-10 MED ORDER — SENNOSIDES-DOCUSATE SODIUM 8.6-50 MG PO TABS: 2.0000 | ORAL_TABLET | ORAL | Status: DC

## 2019-07-10 MED ORDER — ONDANSETRON HCL 4 MG PO TABS: 4.0000 mg | ORAL_TABLET | ORAL | Status: DC | PRN

## 2019-07-10 MED ORDER — OXYTOCIN-SODIUM CHLORIDE 30-0.9 UT/500ML-% IV SOLN: 2.5000 [IU]/h | INTRAVENOUS | Status: DC

## 2019-07-10 MED ORDER — ACETAMINOPHEN 325 MG PO TABS
650.0000 mg | ORAL_TABLET | ORAL | Status: DC | PRN
  Administered 2019-07-10: 650 mg via ORAL
  Filled 2019-07-10: qty 2

## 2019-07-10 MED ORDER — LACTATED RINGERS IV SOLN: INTRAVENOUS | Status: DC

## 2019-07-10 MED ORDER — DIPHENHYDRAMINE HCL 25 MG PO CAPS: 25.0000 mg | ORAL_CAPSULE | Freq: Four times a day (QID) | ORAL | Status: DC | PRN

## 2019-07-10 MED ORDER — OXYTOCIN BOLUS FROM INFUSION: 333.0000 mL | Freq: Once | INTRAVENOUS | Status: DC

## 2019-07-10 MED ORDER — OXYCODONE-ACETAMINOPHEN 5-325 MG PO TABS: 2.0000 | ORAL_TABLET | ORAL | Status: DC | PRN

## 2019-07-10 MED ORDER — SIMETHICONE 80 MG PO CHEW: 80.0000 mg | CHEWABLE_TABLET | ORAL | Status: DC | PRN

## 2019-07-10 MED ORDER — DIBUCAINE (PERIANAL) 1 % EX OINT: 1.0000 "application " | TOPICAL_OINTMENT | CUTANEOUS | Status: DC | PRN

## 2019-07-10 MED ORDER — FENTANYL CITRATE (PF) 100 MCG/2ML IJ SOLN: 50.0000 ug | INTRAMUSCULAR | Status: DC | PRN

## 2019-07-10 MED ORDER — PRENATAL MULTIVITAMIN CH: 1.0000 | ORAL_TABLET | Freq: Every day | ORAL | Status: DC

## 2019-07-10 MED ORDER — OXYCODONE-ACETAMINOPHEN 5-325 MG PO TABS: 1.0000 | ORAL_TABLET | ORAL | Status: DC | PRN

## 2019-07-10 MED ORDER — ZOLPIDEM TARTRATE 5 MG PO TABS: 5.0000 mg | ORAL_TABLET | Freq: Every evening | ORAL | Status: DC | PRN

## 2019-07-10 MED ORDER — LIDOCAINE HCL (PF) 1 % IJ SOLN: 30.0000 mL | INTRAMUSCULAR | Status: DC | PRN

## 2019-07-10 MED ORDER — LACTATED RINGERS IV SOLN: 500.0000 mL | INTRAVENOUS | Status: DC | PRN

## 2019-07-10 MED ORDER — ACETAMINOPHEN 325 MG PO TABS: 650.0000 mg | ORAL_TABLET | ORAL | Status: DC | PRN

## 2019-07-10 NOTE — H&P (Addendum)
OBSTETRIC ADMISSION HISTORY AND PHYSICAL  [name] [name] is a 39 y.o. female G2P1001 with IUP at 52w0dpresenting for SOL. She reports +FMs. No LOF, VB, blurry vision, headaches, peripheral edema, or RUQ pain. She plans on breast feeding. She requests pills for birth control.  Dating: By 375rdTrimester [place]--->  Estimated Date of Delivery: 07/17/19  Sono:    '@[redacted]w[redacted]d' , normal anatomy, cephalic presentation,  EFW [id number]   Prenatal History/Complications: --No Prenatal Care --Acute psychosis  --Schizoaffective Disorder Bipolar Type  --Advanced Maternal Age --gHTN --Obesity --Diabetes Mellitus --Prior C-section  Past Medical History: Past Medical History:  Diagnosis Date  . Bipolar affective disorder, currently manic, mild ([place]   . Diabetes mellitus without complication ([place]   . Schizophrenia ([facility]     Past Surgical History: Past Surgical History:  Procedure Laterality Date  . CESAREAN SECTION  04/2018  . WISDOM TOOTH EXTRACTION      Obstetrical History: OB History    Gravida  2   Para  1   Term  1   Preterm      AB      Living  1     SAB      TAB      Ectopic      Multiple      Live Births  1           Social History: Social History   Socioeconomic History  . Marital status: Legally Separated    Spouse name: Not on file  . Number of children: Not on file  . Years of education: Not on file  . Highest education level: Not on file  Occupational History  . Not on file  Tobacco Use  . Smoking status: Never Smoker  . Smokeless tobacco: Never Used  Vaping Use  . Vaping Use: Never used  Substance and Sexual Activity  . Alcohol use: No  . Drug use: No  . Sexual activity: Not Currently  Other Topics Concern  . Not on file  Social History Narrative  . Not on file   Social Determinants of Health   Financial Resource Strain:   . Difficulty of Paying Living Expenses:   Food Insecurity:   . Worried About RCharity fundraiserin the Last  Year:   . RArboriculturistin the Last Year:   Transportation Needs:   . LFilm/video editor(Medical):   .Marland KitchenLack of Transportation (Non-Medical):   Physical Activity:   . Days of Exercise per Week:   . Minutes of Exercise per Session:   Stress:   . Feeling of Stress :   Social Connections:   . Frequency of Communication with Friends and Family:   . Frequency of Social Gatherings with Friends and Family:   . Attends Religious Services:   . Active Member of Clubs or Organizations:   . Attends CArchivistMeetings:   .Marland KitchenMarital Status:     Family History: Family History  Problem Relation Age of Onset  . Drug abuse Maternal Uncle     Allergies: Allergies  Allergen Reactions  . Quetiapine Anaphylaxis    "Pass out"  . Gabapentin Other (See Comments)    "Feet on fire"  . Lorazepam   . Risperidone Other (See Comments)    Pt reports "It makes me blind"   . Valproic Acid Other (See Comments)    Pt reports "it makes me too sleepy"   . Aripiprazole Other (See Comments)  Makes patient "too tired"  . Fluphenazine Rash  . Haloperidol Other (See Comments)    Makes feel hot inside. "Tires my tongue"   . Trazodone Other (See Comments)  . Ziprasidone Hcl Palpitations    Medications Prior to Admission  Medication Sig Dispense Refill Last Dose  . Prenatal Vit-Fe Fumarate-FA (PRENATAL MULTIVITAMIN) TABS tablet Take 1 tablet by mouth daily at 12 noon. 30 tablet 0      Review of Systems:  All systems reviewed and negative except as stated in HPI  PE: Blood pressure 121/80, pulse (!) 103, temperature 98.6 F (37 C), resp. rate 17. General appearance: semi-alert, cooperative, responding to instructions, difficulty with verbal response. Lungs: regular rate and effort Heart: regular rate  Abdomen: soft, non-tender Extremities: Homans sign is negative, no sign of DVT Presentation: cephalic EFM: 130 bpm, moderate variability, no accels, no decels Toco:  regular Dilation: 10 Station: -2 Exam by:: kim fields  Prenatal labs: ABO, Rh: --/--/O POS (06/27 1854) Antibody: POS (06/27 1854) Rubella: 2.10 (06/27 1854) RPR: NON REACTIVE (06/27 1854)  HBsAg: NON REACTIVE (06/27 1854)  HIV: Non Reactive (06/27 1854)  GBS:   Unknown 2 hr GTT: Not done, A1c 5.7   No results found for this or any previous visit (from the past 24 hour(s)).  Patient Active Problem List   Diagnosis Date Noted  . Labor without complication 86/57/8469  . Third trimester pregnancy 07/06/2019  . [redacted] weeks gestation of pregnancy   . AMA (advanced maternal age) multigravida 27+, third trimester 07/04/2019  . Supervision of high risk pregnancy, antepartum 07/04/2019  . No prenatal care in current pregnancy in third trimester 07/04/2019  . Obesity in pregnancy 07/04/2019  . BMI 30s 07/04/2019  . History of cesarean delivery 07/04/2019  . Short interval between pregnancies affecting pregnancy in third trimester, antepartum 07/04/2019  . Adjustment disorder with mixed disturbance of emotions and conduct 07/11/2017  . Acute psychosis ([place]) 12/21/2015  . Insomnia   . Anxiety state   . Overactive bladder   . Diabetes mellitus ([place]) 02/08/2015  . Schizoaffective disorder, bipolar type ([place]) 01/28/2015  . Non compliance w medication regimen     Assessment: [name] [name] is a 39 y.o. G2P1001 at 47w0dhere for SOL.  1. Labor: Active 2. FWB: Cat 1 3. Pain: Per patient request 4. GBS: Unknown 5. No Prenatal Care 6. Acute psychosis  7. Schizoaffective Disorder Bipolar Type  8. Advanced Maternal Age 39 gHTN 10.Obesity 11. Diabetes Mellitus 12. Previous C-section, requesting TOLAC   Plan: Admit to labor and delivery for expectant management of vaginal delivery. --gHTN: will monitor BP  Alana Lilland, DO  PGY1, Family Medicine  07/10/2019, 12:32 PM   I spoke with and examined patient and agree with resident/PA-S/MS/SNM's note and plan of care. Pt w/ no  prenatal care, admitted 6/27-6/30 for pyschosis, dx w/ schizoaffective disorder. Met criteria for GHTN while hospitalized, pre-e labs were normal. Pt currently asymptomatic and normotensive. UDS was neg at that time. Not currently on meds. Lives w/ her pastor. Doesn't have custody of her other child. Prev c/s for 'unable to push', wants TOLAC. ?h/o DM on problem list from 2017, not on meds, A1C on 6/28 was 5.7.  Expectant management KRoma Schanz CNM, [facility]7/04/2019 1:04 PM

## 2019-07-10 NOTE — Progress Notes (Signed)
Patient refused her shift assessment . Explained the importance  of the assessment states that she is ok. Will continue to monitor .

## 2019-07-10 NOTE — ED Provider Notes (Signed)
[facility] LABOR AND DELIVERY Provider Note   CSN: [id number] Arrival date & time: 07/10/19  1126     History No chief complaint on file.   [name] [name] is a 39 y.o. female.  39 yo F  G2P1 with a prior C-section comes in with a chief complaint of recurrent abdominal cramping.  Patient is estimated 39 weeks based on the ultrasound done last week.  Arrived via EMS to room in the emergency department.  Having ongoing contractions.  Complaining of diffuse abdominal pain.  Thinks she had a gush of fluid last week but no gush of fluid or vaginal bleeding today.  Level 5 caveat acuity condition.  The history is provided by the patient.  Illness Severity:  Severe Onset quality:  Sudden Duration:  2 hours Timing:  Constant Progression:  Unchanged Chronicity:  New      Past Medical History:  Diagnosis Date  . Bipolar affective disorder, currently manic, mild ([place])   . Diabetes mellitus without complication ([place])   . Schizophrenia [facility])     Patient Active Problem List   Diagnosis Date Noted  . Labor without complication 10/30/8525  . Third trimester pregnancy 07/06/2019  . [redacted] weeks gestation of pregnancy   . AMA (advanced maternal age) multigravida 43+, third trimester 07/04/2019  . Supervision of high risk pregnancy, antepartum 07/04/2019  . No prenatal care in current pregnancy in third trimester 07/04/2019  . Obesity in pregnancy 07/04/2019  . BMI 30s 07/04/2019  . History of cesarean delivery 07/04/2019  . Short interval between pregnancies affecting pregnancy in third trimester, antepartum 07/04/2019  . Adjustment disorder with mixed disturbance of emotions and conduct 07/11/2017  . Acute psychosis ([place]) 12/21/2015  . Insomnia   . Anxiety state   . Overactive bladder   . Diabetes mellitus ([place]) 02/08/2015  . Schizoaffective disorder, bipolar type ([place]) 01/28/2015  . Non compliance w medication regimen     Past Surgical History:  Procedure Laterality Date  .  CESAREAN SECTION  04/2018  . WISDOM TOOTH EXTRACTION       OB History    Gravida  2   Para  1   Term  1   Preterm      AB      Living  1     SAB      TAB      Ectopic      Multiple      Live Births  1           Family History  Problem Relation Age of Onset  . Drug abuse Maternal Uncle     Social History   Tobacco Use  . Smoking status: Never Smoker  . Smokeless tobacco: Never Used  Vaping Use  . Vaping Use: Never used  Substance Use Topics  . Alcohol use: No  . Drug use: No    Home Medications Prior to Admission medications   Medication Sig Start Date End Date Taking? Authorizing Provider  Prenatal Vit-Fe Fumarate-FA (PRENATAL MULTIVITAMIN) TABS tablet Take 1 tablet by mouth daily at 12 noon. 07/06/19   Clapacs, Madie Reno, MD    Allergies    Quetiapine, Gabapentin, Lorazepam, Risperidone, Valproic acid, Aripiprazole, Fluphenazine, Haloperidol, Trazodone, and Ziprasidone hcl  Review of Systems   Review of Systems  Unable to perform ROS: Acuity of condition    Physical Exam Updated Vital Signs BP 121/80   Pulse (!) 103   Temp 98.6 F (37 C)   Resp 17  Physical Exam Vitals and nursing note reviewed.  Constitutional:      General: She is not in acute distress.    Appearance: She is well-developed. She is not diaphoretic.  HENT:     Head: Normocephalic and atraumatic.  Eyes:     Pupils: Pupils are equal, round, and reactive to light.  Cardiovascular:     Rate and Rhythm: Normal rate and regular rhythm.     Heart sounds: No murmur heard.  No friction rub. No gallop.   Pulmonary:     Effort: Pulmonary effort is normal.     Breath sounds: No wheezing or rales.  Abdominal:     General: There is no distension.     Palpations: Abdomen is soft.     Tenderness: There is no abdominal tenderness.     Comments: Gravid.  Fundal height just underneath the costal margin.  Musculoskeletal:        General: No tenderness.     Cervical back: Normal  range of motion and neck supple.  Skin:    General: Skin is warm and dry.  Neurological:     Mental Status: She is alert and oriented to person, place, and time.  Psychiatric:        Behavior: Behavior normal.     ED Results / Procedures / Treatments   Labs (all labs ordered are listed, but only abnormal results are displayed) Labs Reviewed  RAPID URINE DRUG SCREEN, HOSP PERFORMED  COMPREHENSIVE METABOLIC PANEL  CBC  RPR  TYPE AND SCREEN    EKG None  Radiology No results found.  Procedures Procedures (including critical care time)  Medications Ordered in ED Medications  lactated ringers infusion (has no administration in time range)  oxytocin (PITOCIN) IV BOLUS FROM BAG (has no administration in time range)  oxytocin (PITOCIN) IV infusion 30 units in NS 500 mL - Premix (has no administration in time range)  lactated ringers infusion 500-1,000 mL (has no administration in time range)  acetaminophen (TYLENOL) tablet 650 mg (has no administration in time range)  oxyCODONE-acetaminophen (PERCOCET/ROXICET) 5-325 MG per tablet 1 tablet (has no administration in time range)  oxyCODONE-acetaminophen (PERCOCET/ROXICET) 5-325 MG per tablet 2 tablet (has no administration in time range)  ondansetron (ZOFRAN) injection 4 mg (has no administration in time range)  sodium citrate-citric acid (ORACIT) solution 30 mL (has no administration in time range)  lidocaine (PF) (XYLOCAINE) 1 % injection 30 mL (has no administration in time range)  fentaNYL (SUBLIMAZE) injection 50-100 mcg (has no administration in time range)    ED Course  I have reviewed the triage vital signs and the nursing notes.  Pertinent labs & imaging results that were available during my care of the patient were reviewed by me and considered in my medical decision making (see chart for details).    MDM Rules/Calculators/A&P                          39 yo F G2P1 here with a chief complaint of contractions.   Patient is presently [redacted] weeks gestation.  Brought to the emergency department instead of the MAU by EMS.  Discussion with the rep response OB patient taken urgently to the MAU.  The patients results and plan were reviewed and discussed.   Any x-rays performed were independently reviewed by myself.   Differential diagnosis were considered with the presenting HPI.  Medications  lactated ringers infusion (has no administration in time range)  oxytocin (  PITOCIN) IV BOLUS FROM BAG (has no administration in time range)  oxytocin (PITOCIN) IV infusion 30 units in NS 500 mL - Premix (has no administration in time range)  lactated ringers infusion 500-1,000 mL (has no administration in time range)  acetaminophen (TYLENOL) tablet 650 mg (has no administration in time range)  oxyCODONE-acetaminophen (PERCOCET/ROXICET) 5-325 MG per tablet 1 tablet (has no administration in time range)  oxyCODONE-acetaminophen (PERCOCET/ROXICET) 5-325 MG per tablet 2 tablet (has no administration in time range)  ondansetron (ZOFRAN) injection 4 mg (has no administration in time range)  sodium citrate-citric acid (ORACIT) solution 30 mL (has no administration in time range)  lidocaine (PF) (XYLOCAINE) 1 % injection 30 mL (has no administration in time range)  fentaNYL (SUBLIMAZE) injection 50-100 mcg (has no administration in time range)    Vitals:   07/10/19 1153  BP: 121/80  Pulse: (!) 103  Resp: 17  Temp: 98.6 F (37 C)    Final diagnoses:  Uterine contractions during pregnancy      Final Clinical Impression(s) / ED Diagnoses Final diagnoses:  Uterine contractions during pregnancy    Rx / DC Orders ED Discharge Orders    None       Deno Etienne, DO 07/10/19 1156

## 2019-07-10 NOTE — Progress Notes (Signed)
CSW was called to labor and delivery due to [name]'s behaviors ( disorganized and tangential thought process). CSW briefly reviewed chart and identified  [name] was discharged from [facility] on 6/30 after treatment for psychosis. CSW visited [name] at bedside, however, was unable to complete a full assessment due to [name] thought process. CPS was contacted due  concerns and [name]'s mention of other child in foster care.  CSW contacted [facility] CPS to complete report. Investigator Wes Early took report and came to hospital to visit with [name] for additional review.   Mr. Donnetta Hutching stated a meeting with assigned SW will take place on Tuesday. A copy of temporary safety disposition wasl left for weekday CSW and placed in chart.  CSW updated weekday [place] via telephone so that additional f/u can be conducted omorrow. At this time, infant is not safe for discharge with [name]. A sitter is currently in room with [name] for additional support and obsertvation.   Madiha Bambrick D. Lissa Morales, MSW, [facility] Clinical Social Worker [phone]

## 2019-07-10 NOTE — Anesthesia Preprocedure Evaluation (Deleted)
Anesthesia Evaluation    Airway Mallampati: III  TM Distance: >3 FB Neck ROM: Full    Dental no notable dental hx. (+) Teeth Intact   Pulmonary neg pulmonary ROS,    Pulmonary exam normal breath sounds clear to auscultation       Cardiovascular negative cardio ROS Normal cardiovascular exam Rhythm:Regular Rate:Normal     Neuro/Psych PSYCHIATRIC DISORDERS Anxiety Bipolar Disorder Schizophrenia Currently in Manic statenegative neurological ROS     GI/Hepatic Neg liver ROS, GERD  ,  Endo/Other  diabetes, Poorly Controlled, GestationalObesity  Renal/GU negative Renal ROS     Musculoskeletal   Abdominal (+) + obese,   Peds  Hematology  (+) anemia ,   Anesthesia Other Findings   Reproductive/Obstetrics (+) Pregnancy Term  Previous C/Section in labor                             Anesthesia Physical Anesthesia Plan  ASA: III  Anesthesia Plan: Epidural and Spinal   Post-op Pain Management:    Induction:   PONV Risk Score and Plan:   Airway Management Planned: Natural Airway  Additional Equipment:   Intra-op Plan:   Post-operative Plan:   Informed Consent: I have reviewed the patients History and Physical, chart, labs and discussed the procedure including the risks, benefits and alternatives for the proposed anesthesia with the patient or authorized representative who has indicated his/her understanding and acceptance.       Plan Discussed with: CRNA, Anesthesiologist and Surgeon  Anesthesia Plan Comments: (Spinal if repeat C/Section. Epidural if VBAC. Awaiting labs. Covid pending.)        Anesthesia Quick Evaluation

## 2019-07-10 NOTE — Progress Notes (Signed)
1130 called to [facility] to see a pregnant pt with a psych hx. RN reports pt is complaining of abd pain. 1135 at pt's bedside. EMT says pt is a previous C/S. FHR 130 BPM. Pt cooperative. Pt's cervix is ? Completely dilated. The vertex is at a zero station.Dr. Nehemiah Settle notified. Pt is to be transferred to L&D. Dr. Quinn Plowman with transferr of pt. 1145 pt on L7D in room 217.

## 2019-07-10 NOTE — ED Triage Notes (Signed)
Pt here via [facility] with CO contractions every 2-5 mins. Pt states she is in her 9th month. Pt states this is her second baby. 1st baby was delivered Via section. OB rapid response paged on arrival to ED.   1140 Rapid OB in ER room . FHR 131. PT cervix complete and pt transported to L&D with this RN, [facility] and Dr. Nehemiah Settle.

## 2019-07-10 NOTE — Progress Notes (Signed)
Phlebotomist at bedside to draw routine bloodwork (CBC, RPR, CMP and type screen) Patient refusing to allow blood to be drawn.

## 2019-07-10 NOTE — Progress Notes (Signed)
Patient states that she does not want 650 mg of tylenol .States  That she only want 350 mg called Mallie Snooks  CNM states that it ok to give 350 mg. States that no new order is needed.

## 2019-07-10 NOTE — Discharge Summary (Addendum)
Postpartum Discharge Summary  Date of Service updated  07/11/2019     Patient Name: [name] [name] DOB: [birth year] MRN: [id number]  Date of admission: 07/10/2019 Delivery date:07/10/2019  Delivering provider: Wells Guiles R  Date of discharge:  07/11/2019   Admitting diagnosis: Labor without complication [T65] Indication for care in labor or delivery [O75.9] Intrauterine pregnancy: [redacted]w[redacted]d    Secondary diagnosis:  Active Problems:   Schizoaffective disorder, bipolar type ([place]   Labor without complication   Indication for care in labor or delivery  Additional problems: recent hospitalization for acute psychosis     Discharge diagnosis: Term Pregnancy Delivered and Gestational Hypertension                                            Post partum procedures:none Augmentation: N/A Complications: None  Hospital course: Onset of Labor With Vaginal Delivery      39y.o. yo G2P1001 at 395w0das admitted in Active Labor on 07/10/2019. Patient had an uncomplicated labor course as follows:  Membrane Rupture Time/Date: 12:00 PM ,07/10/2019   Delivery Method:Vaginal, Spontaneous  Episiotomy: None  Lacerations:  Periurethral     SRN spoke to Psychiatry nurse practitioner with Pediatrician discussing safety of infant and Psychiatry NP suggests not having infant in room with patient as it posses safety risks. Will keep infant in nursery to keep safe. RN went to lunch and after Pediatrician informed patient infant would not be returning to the room patient raised voice enough for another staff member to enter room. Security was called and RN was informed to stay away from room to not escalate situation. RN informed charge nurse who decided to change assignment. Physician team consulted to remove patient to a unit to provide care for behavior and mental issues. Will report to following nurse. Infant continued to stay in nursery care and was safe. Psych NP note states: Treatment Plan  Summary: Schizoaffective disorder, bipolar type: -Recommend inpatient psychiatric hospitalization -Recommend Zyprexa 10 mg twice daily  Agitation: -Recommend Zyprexa 1062mM or oral along with Benadryl 50 IM or oral as needed daily  Patient is NOT able to safely care for her baby based on the assessment and her past history including non-compliance of psychiatric medications.  Disposition: Recommend psychiatric Inpatient admission when medically cleared She is ambulating, tolerating a regular diet, passing flatus, and urinating well. Patient is discharged  in stable condition on  07/11/2019 .  Newborn Data: Birth date:07/10/2019  Birth time:12:10 PM  Gender:Female  Living status:Living  Apgars:9 ,9  Weight:3130 g  3130g  Magnesium Sulfate received: No BMZ received: No Rhophylac:N/A MMR:N/A T-DaP:ordered Flu: No Transfusion:No  Physical exam  Vitals:   07/10/19 1331 07/10/19 1511 07/10/19 1759 07/10/19 2215  BP: 117/71 112/70 (!) 122/93 110/79  Pulse: 99 (!) 110 (!) 101 100  Resp: '16 18 18 18  ' Temp:  97.8 F (36.6 C) 97.8 F (36.6 C) 98.3 F (36.8 C)  TempSrc:  Oral Oral Oral  SpO2:  100% 100% 100%  Weight:      Height:       General: alert Lochia: appropriate Uterine Fundus: firm Incision: N/A DVT Evaluation: No evidence of DVT seen on physical exam. Labs: Lab Results  Component Value Date   WBC 6.9 07/03/2019   HGB 10.9 (L) 07/03/2019   HCT 33.0 (L) 07/03/2019   MCV 93.0 07/03/2019  PLT 212 07/03/2019   CMP Latest Ref Rng & Units 07/03/2019  Glucose 70 - 99 mg/dL 104(H)  BUN 6 - 20 mg/dL 5(L)  Creatinine 0.44 - 1.00 mg/dL 0.70  Sodium 135 - 145 mmol/L 137  Potassium 3.5 - 5.1 mmol/L 3.3(L)  Chloride 98 - 111 mmol/L 104  CO2 22 - 32 mmol/L 23  Calcium 8.9 - 10.3 mg/dL 9.1  Total Protein 6.5 - 8.1 g/dL 7.0  Total Bilirubin 0.3 - 1.2 mg/dL 0.7  Alkaline Phos 38 - 126 U/L 166(H)  AST 15 - 41 U/L 29  ALT 0 - 44 U/L 26   Edinburgh Score: No  flowsheet data found.   After visit meds:  Allergies as of 07/11/2019      Reactions   Quetiapine Anaphylaxis   "Pass out"   Gabapentin Other (See Comments)   "Feet on fire"   Lorazepam    Risperidone Other (See Comments)   Pt reports "It makes me blind"   Valproic Acid Other (See Comments)   Pt reports "it makes me too sleepy"   Aripiprazole Other (See Comments)   Makes patient "too tired"   Fluphenazine Rash   Haloperidol Other (See Comments)   Makes feel hot inside. "Tires my tongue"   Trazodone Other (See Comments)   Ziprasidone Hcl Palpitations      Medication List    TAKE these medications   OLANZapine 10 MG tablet Commonly known as: ZYPREXA Take 1 tablet (10 mg total) by mouth 2 (two) times daily.   prenatal multivitamin Tabs tablet Take 1 tablet by mouth daily at 12 noon.        Discharge home in stable condition Infant Feeding: Bottle Infant Disposition:newborn nursery Discharge instruction: per After Visit Summary and Postpartum booklet. Activity: Advance as tolerated. Pelvic rest for 6 weeks.  Diet: routine diet Future Appointments:No future appointments. Follow up Visit:  [place] [facility] [place] [facility] Follow up in 4 week(s).   Specialty: Obstetrics and Gynecology Contact information: [place] [address] [phone]              Roma Schanz, CNM  P [facility] Admin Pool Please schedule this patient for PP visit in: 4 weeks  High risk pregnancy complicated by: GHTN, psychosis w/ recent admission, no prenatal care  Delivery mode: VBAC  Anticipated Birth Control: pills  PP Procedures needed: BP check  Schedule Integrated BH visit: yes  Provider: MD  Patient was IVC and will be taken to appropriate behavioral health facility  07/11/2019 Emeterio Reeve, MD

## 2019-07-11 DIAGNOSIS — F250 Schizoaffective disorder, bipolar type: Secondary | ICD-10-CM

## 2019-07-11 MED ORDER — OLANZAPINE 10 MG PO TABS
10.0000 mg | ORAL_TABLET | Freq: Two times a day (BID) | ORAL | Status: DC
  Administered 2019-07-11: 10 mg via ORAL
  Filled 2019-07-11 (×4): qty 1

## 2019-07-11 MED ORDER — OLANZAPINE 10 MG PO TABS: 10.0000 mg | ORAL_TABLET | Freq: Two times a day (BID) | ORAL | 1 refills | Status: DC

## 2019-07-11 NOTE — Progress Notes (Signed)
Patient refused her  3rd four hour check.States that she Is ok. Patient also refused to have her vital  signs taken. Patient denies any pain. Educated the patient on the important of her assessment states" that she has the right to refuse." Will continue to monitor.

## 2019-07-11 NOTE — Progress Notes (Signed)
Patient is refusing to have nursing care. Durward Fortes, MSW in with security. Patient only wants to see a doctor. Dr. Roselie Awkward in with security. Patient will be transferred to [facility] when a bed is available. Sitter was asked to sit outside patient's room while security and MD go into room to talk with patient. Due to risk of patient causing harm to herself, security along with Herbert Deaner, RN, Administrative Coordinator removed all high risk objects from patient's room including patient's purse. All belongings were bagged and placed in locked area. Patient is now in hospital gown.

## 2019-07-11 NOTE — Progress Notes (Signed)
CSW received returned call from [place] Social Worker K. Ricard Dillon and discussed safety concerns and visitation. CPS social worker reported that safety plan will remain in place and if safety concerns are present, [name] is not allowed to visit with infant. CFT meeting is scheduled for tomorrow 07/12/19 and discharge plan will be decided. Currently there are barriers to infant discharging to [name] at this time.      Virgie Dad Florabelle Cardin, MSW, LCSW [facility] and [place] at [place] [phone]
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
## 2019-07-11 NOTE — Progress Notes (Signed)
Patient was eating small bites of her meal from dinner and mumbling inaudibly. Pt stated she would like to see her baby. I informed Pt I would let the nurse know her request. After briefly talking with Pt (not making much sense and very scattered train of thought) . Pt stated she was very tired and no one would let her sleep.  I was able to help her get her socks on and laid down in bed with lights off. She asked to see CSW tomorrow, I let her know I would pass along her request to the RN.   Pt now sleeping.

## 2019-07-11 NOTE — Progress Notes (Signed)
Post Partum Day 1  Subjective: up ad lib, voiding, tolerating PO and + flatus  Objective: Blood pressure 110/79, pulse 100, temperature 98.3 F (36.8 C), temperature source Oral, resp. rate 18, height 5\' 4"  (1.626 m), weight 90.7 kg, SpO2 100 %, unknown if currently breastfeeding.  Physical Exam:  General: alert, cooperative, appears stated age and mild distress Lochia: Unknown Uterine Fundus: Unknown Incision: N/A DVT Evaluation: Patient denies all symptoms  No results for input(s): HGB, HCT in the last 72 hours.  Assessment/Plan: --Patient denying all lab work and physical exams --Discussed expectation for CPS and LCSW today. Pt previously aware --Sitter at bedside --Discussed possible discharge tomorrow pending CPS involvement to allow time with newborn --Total time at bedside: 30 minutes   LOS: 1 day   Darlina Rumpf, CNM 07/11/2019, 7:10 AM

## 2019-07-11 NOTE — Progress Notes (Signed)
Patient requesting medication for anxiety. Patient reports God is telling her she she needs to take medicine, just one pill, no pain medicine. Patient is rambling statements, sentence by sentence, using pauses, looking up and touching her head. She is holding a wash cloth over her nose while standing and talking with RN. Dr. Roselie Awkward notified for patient's request of medication. Dr. Roselie Awkward wants to review chart and will order med.  Patient states she wants to write down her thoughts. High lighter with top removed and piece of paper given to patient while supervised by sitter. Officer also present. Patient standing up and writing on paper, calmly and quietly.
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
## 2019-07-11 NOTE — Progress Notes (Signed)
Patient yelling at nurses and sitter after she was told the baby must stay in the nursery.  She walked around the nurses station yelling.  I guided her back in her room and she began yelling at me.  Sitter was removed from room for her safety after patient made racial comments . She rambled saying "white people shouldn't be in here while I am eating."  " I hope all you [facility] people pay, in [place] name"    Security officer is in room presently. Patient is staying in room.

## 2019-07-11 NOTE — Progress Notes (Signed)
Sitter in room with patient. Security standing at doorway with door open. Patient is calmly talking with sitter. Dinner was ordered for patient.

## 2019-07-11 NOTE — Progress Notes (Signed)
RN spoke to Psychiatry nurse practitioner with Pediatrician discussing safety of infant and Psychiatry NP suggests not having infant in room with patient as it posses safety risks. Will keep infant in nursery to keep safe. RN went to lunch and after Pediatrician informed patient infant would not be returning to the room patient raised voice enough for another staff member to enter room. Security was called and RN was informed to stay away from room to not escalate situation. RN informed charge nurse who decided to change assignment. Physician team consulted to remove patient to a unit to provide care for behavior and mental issues. Will report to following nurse. Infant continued to stay in nursery care and was safe.

## 2019-07-11 NOTE — Progress Notes (Addendum)
3:58pm-CSW aware that [name] has been served [employer] 2 copies have been placed on [name]'s chart at this time. CSW will continue to seek placement for [name], as placement is pending at this time.   CSW updated CPS of recent behaviors as well as sent over Psych note to K. Owens at this time as requested. CSW also has completed all IVC paperwork and placed on chart at this time. CSW is awaiting Sheriff arrival in order for [name] to be served IVC paperwork.  CSW has also began search for inpt bed at this time for [name]. [facility] full as well as [facility]. CSW to reach out to other facilities at this time.     Virgie Dad Jaziel Bennett, MSW, LCSW Women[facility] and [place] at [place] [phone]

## 2019-07-11 NOTE — Progress Notes (Signed)
Called to room about patient behavior with nurses stating that she was upset after being told that the baby will be staying in the nursery. It is reported that she began yelling at staff members and walking around the nursing station several times; she was reported as verbally aggressive. Patient sitter was removed for safety concerns and security was called to the room. Nursery staff reports that the patient had also been banging at their door to see the baby. Pediatrics is also concerned about baby safety with mother.   Psychiatry recently did an evaluation and we are waiting on their note. Nursing staff is concerned that the patient may need emergency IVC. Patient is currently in her room with security present. Situation was reviewed with Dr. Roselie Awkward and we will await recommendations from psychiatry.   [name] Patience, DO PGY 1, Family Medicine

## 2019-07-11 NOTE — Discharge Instructions (Signed)

## 2019-07-11 NOTE — Progress Notes (Signed)
Notified Mallie Snooks @ 547 am that patient has refused fundal checks and  ,assessments all shift and that she also refused morning vitals.

## 2019-07-11 NOTE — Progress Notes (Signed)
RN entered room to assess patient and baby. Physician asked for baby to go to the nursery since the sitter expressed she didn't think baby was safe with patient in the room. RN listened to patient talk about her life progress. Patient appears to have maniac thoughts, unorganized stories. RN told patient the baby was going to be assessed in the nursery and patient seemed okay with this plan of care. Patient let RN take baby but did not let RN assess her for care. Will continue to monitor.

## 2019-07-11 NOTE — Progress Notes (Signed)
CSW followed back up with [name] [name] with [facility] CPS to voice concerns of staff at this time. CSW was asked to send over Peds note so that CPS worker can staff with supervisor at this time. CSW awaiting call back at this time from [name] [name].      [name] [name], MSW, LCSW Women[facility] and [place] at [place] [phone]

## 2019-07-11 NOTE — Progress Notes (Addendum)
Baby was given her bath by Rn. Once the bath was complete explained to the mother that the baby needs  to be dryed and wrapped in cover or either skin to skin. The patient refused to let the baby be wrapped immediately. The patient would not let the Rn put clothes or cover on the baby for several minutes .   States that the baby would be ok .  The baby was shivering. Educated  the mother that the baby need to be wrapped or skin to skin immediatly."The patient states that the Rn and the sitters were evil and that we did not know how to care for a baby. "  Rn called charge nurse. Patient made inappropriate remarks toward the sitter and Rn . Patient was verbally  Aggressive. Patient eventually let Rn wrap the baby in blankets .Charge nurse currently talking with patient .
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
## 2019-07-11 NOTE — Progress Notes (Signed)
Patient appears to be very sleepy. Pt continues to refuse me putting baby in crib for safe sleep. Pt continues to cover babys head and face and is holdng the baby with the body dangling. I am afraid patient is going to drop or smother baby. Pt. Finally agrees to let me put baby in crib and is going to eat a breakfast try.

## 2019-07-11 NOTE — Progress Notes (Signed)
Patient took medication after making several non-coherent statements about sleep, God, suicide, tired, voices, stealing debt, [place]e Belts, possession, and more. Allowed VS to be taken and meal given. Sitter remains in room. Security not present.
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
## 2019-07-11 NOTE — Progress Notes (Signed)
Security remains in room with patient. She is currently calm and cooperative with security present. Dinner ordered. RN and sitter remain available as patient needs assistance.

## 2019-07-11 NOTE — Progress Notes (Signed)
CSW received returned call from [place] Social Worker K. Ricard Dillon and discussed safety concerns and visitation. CPS social worker reported that safety plan will remain in place and if safety concerns are present, [name] is not allowed to visit with infant. CFT meeting is scheduled for tomorrow 07/12/19 and discharge plan will be decided. Currently there are barriers to infant discharging to [name] at this time.      Virgie Dad Irven Ingalsbe, MSW, LCSW [facility] [place] at [place] [phone]

## 2019-07-12 MED ORDER — OLANZAPINE 5 MG PO TABS
5.0000 mg | ORAL_TABLET | Freq: Two times a day (BID) | ORAL | Status: DC
  Administered 2019-07-12 – 2019-07-13 (×2): 5 mg via ORAL
  Filled 2019-07-12 (×4): qty 1

## 2019-07-12 MED ORDER — MELATONIN 3 MG PO TABS
6.0000 mg | ORAL_TABLET | Freq: Every day | ORAL | Status: DC
  Filled 2019-07-12 (×3): qty 2

## 2019-07-12 NOTE — Progress Notes (Signed)
Went to evaluate patient per RN request as she is refusing Zyprexa.   States she will not take 10mg  as it makes her too sleepy. Tried many times to convince her to take increased dose at Psych recommendation but she refuses. She is willing to take 5mg  BID, would also like melatonin for sleep.   Dose changed to 5mg  BID for Zyprexa and given 6mg  melatonin at bedtime. Will notify psych.   Ongoing search for inpatient psych bed.   Clarnce Flock MD/MPH 11:46 AM

## 2019-07-12 NOTE — Progress Notes (Signed)
Pt requesting Depakote 250 mg and asking for me to call MD. Dr. Marice Potter notified of patient's request for medication. Patient is very verbal and agitated this am. I told patient I would relay her request.

## 2019-07-12 NOTE — Progress Notes (Addendum)
1:29pm-CSW spoke with Neoma Laming from [facility] to gather more information on whether [name] has been offered a bed. CSW aware that at this time [facility] is still looking over information. CSW to follow back up with dispositions CSW later.   11:47am-CSW received call back from P. Bright with [facility] CPS. CSW was advised that CFT would be held today at 1pm here at the hospital. CSW advised CPS that CSW is not sure if [name] is coherent enough to participate however CSW was advised by CPS that they would still need to speak with [name] for further documentation. CSW understanding of this.   CSW spoke with CSW supervisor to get moe direction on case at this time. CSW still has not located bed as no calls have been returned or updates given by [facility] or [facility].   11:15am-CSW received call from [place] with [facility] CPS. CSW was asked to send over documentation regarding [name] and her mental health needs. CSW will fax over information at this time. CSW inquired on if [name] is allowed to see infant and per CPS "we are not allowed to make that decision until we have custody of infant". Per CPS. They are planning CFT for further discussion of custody today.   10:24am-CSW spoke with [facility] for inpt psych bed. CSW was asked to call back tomorrow as there are no beds today. CSW to follow back up with facility on tomorrow if bed is still needed.   10:12am-CSW spoke with Brunetta Genera and asked for further details on [name] visiting with infant. Brunetta Genera with CPS advised CSW that she would call her program manager and then follow back up with CSW as supervisor is not answering at this time.   CSW made aware that [name] is becoming more agitated and asking to see infant. CSW went to speak with MOBN to advise her of CPS needing to speak with her before she is able to see infant. [name] reported that she understood, but then became upste again once CSW advised [name] CPS worker name. [name] is still under IVC and CSW is still seeking  placement for her at this time. CSW has sent information ober to [place] as well as updated Dispositions with [facility] of urgent need for bed.   9:43am- CSW spoke with Brunetta Genera from [place] and was advised that she would call CSW back once speak with supervisor.   9:19am-CSW reached back out to [facility] and left message asking for call back from Disposition CSW.   9:12am-CSW updated that CPS case has now been assigned to J. Wardlow [phone]  with [facility] CPS. CSW has left voicmail with Brunetta Genera asking for call back at this time.   8:38am-CSW has reached out to following facilities for placement with response given: [facility]- refaxed for further review. [facility]- no answer [facility] answer [facility]- no answer [facility]- spoke with Judson Roch and sent ref over  [facility] voicemail asking for call back [facility] with [facility] and was advised that they have beds, resent referral over.  [facility]-sent referral over  [facility]- advised that no beds at this time, asked to call back around 9:30am for updates.  [facility]-no answer   8:00am-CSW continues to search for inpt bed for [name] at this time. CSW will also plan to follow up with [facility] CPS to gather more information on plans for infant and discharge when medically stable.

## 2019-07-12 NOTE — Progress Notes (Signed)
[name] [name] refused for me to assess her. She stated,  "I was not to touch her." She also refused to take Zyprexa this morning because it "makes her sleepy." She agitated and very loud and boisterous with staff. She is asking to see her baby repeatedly. She is agitated and talking continuously to anybody in the hall or nurses station. Her baby is presently in the nursery. Pediatrician is awaiting directions from CPS concerning visitation with baby.

## 2019-07-12 NOTE — Progress Notes (Signed)
Sat with Pt while Security, Education officer, museum, CPS and Therapist, sports present. Pt received the information that CPS is filing to put mothers baby in [facility]. Mother asked to see an attorney and began to speak irrationally with several examples of flight of ideas. This RN earned her trust and sat with her for @ an hour and listened to Pts flight of ideas. When this RN asked if She was "cold" all the time growing up;( Pt continued to speak of cold moments in life with others and no heaters), the pt began to speak louder (almost "offended) and insistent that her" mother was a very good mother". They were "rich". This RN expressed the need to understand where her comments were coming from and then changed the subject. It's very difficult to leave the room as she will recall other staff" dismissing"her or" disrespecting" her.

## 2019-07-12 NOTE — Progress Notes (Signed)
In [place] escorting CPS worker Brunetta Genera into [employer] along with security and [employer], [name] appear to be appropriate in behavior, however once [name] was advised of CPS taking infant into custody, [name] began to call CPS worker a "liar" and reported no desire to speak with her any longer. In the room, there were other parties that observed [name] "scan the room (looking with eyes) and making other comments such as "my mother didn't allow me to wear braids because I chose to be ugly, I don't like liars, you are my friend and I like [facility]" and other comments. CSW observed that [name] alsop reports that she speaks [place], [place], and multiple other languages. [name] expressed that she would like to have an attorney because "we are lying". [name] requested the presence of Wes Early (Weekend Water engineer) as she felt that "he was an advocate for me and told me that nothing was wrong with my mental health". [name] refused to give contact information or name of FOB to CPS worker at this time.   CSW will continue to seek placement at this time for [name].      [name] [name], MSW, LCSW Women[facility] and [place] at [place] [phone]

## 2019-07-12 NOTE — Progress Notes (Addendum)
4:54pm-CSW escorted J. Wardlow back to [name]'s room to advised [name] of CPS taking infant into custody Hydrologist petetion). CSW awaits copy of Petition at this time from J. Wardlow to place on infants chart. In the event that CSW has left for the day, CSW also updated [employer] (Jess) of the need for petition. CSW also has followed up with Psych Facilities with no designated bed found at this time. CSW to continue to search for bed at this time.   CSW escorted P Bright and CPS worker J. Wardlow up to [name]'s room. CSW was advised that after discussion with [name], CPS will file for petition of infant at thsi time.    CSW MADE AWARE THAT I PER CSW SUPERVISORS IF PEDS FEELS THAT IT IS UNSAFE FOR [name] TO HAVE CONTACT WITH INFANT, THEN THIS STANDS.  CSW aware that Pediatrician has placed nite in chart to indicate these concerns.       [name] [name], MSW, LCSW Women[facility] and [place] at [place] [phone]

## 2019-07-12 NOTE — Progress Notes (Signed)
Post Partum Day#2  Subjective: Resting comfortably in bed. Has been ambulating, urinating and tolerating PO well.   Objective: Blood pressure 122/82, pulse 100, temperature 97.9 F (36.6 C), temperature source Oral, resp. rate 18, height 5\' 4"  (1.626 m), weight 90.7 kg, SpO2 100 %, unknown if currently breastfeeding.  Physical Exam:  General: resting in bed Lochia: appropriate  Uterine Fundus: patient declines Incision: N/A DVT Evaluation: No calf tenderness  No results for input(s): HGB, HCT in the last 72 hours.  Assessment/Plan: --Patient denying all lab work and physical exams --Vitals stable --Infant in nursery --IVC: Plan for DC today pending availability of inpatient beds; SW coordinating --Sitter outside of door   LOS: 2 days   Chauncey Mann, MD 07/12/2019, 5:34 AM

## 2019-07-13 ENCOUNTER — Inpatient Hospital Stay (HOSPITAL_COMMUNITY)
Admission: EM | Admit: 2019-07-13 | Discharge: 2019-07-19 | DRG: 885 | Disposition: A | Discharge status: Home / Self Care | Payer: Medicare Other | Attending: Psychiatry | Admitting: Psychiatry

## 2019-07-13 ENCOUNTER — Other Ambulatory Visit: Payer: Self-pay

## 2019-07-13 ENCOUNTER — Encounter (HOSPITAL_COMMUNITY): Payer: Self-pay | Admitting: Psychiatry

## 2019-07-13 DIAGNOSIS — F29 Unspecified psychosis not due to a substance or known physiological condition: Secondary | ICD-10-CM | POA: Diagnosis present

## 2019-07-13 DIAGNOSIS — F259 Schizoaffective disorder, unspecified: Secondary | ICD-10-CM | POA: Diagnosis not present

## 2019-07-13 DIAGNOSIS — L680 Hirsutism: Secondary | ICD-10-CM | POA: Diagnosis present

## 2019-07-13 DIAGNOSIS — F250 Schizoaffective disorder, bipolar type: Principal | ICD-10-CM | POA: Diagnosis present

## 2019-07-13 DIAGNOSIS — I10 Essential (primary) hypertension: Secondary | ICD-10-CM | POA: Diagnosis present

## 2019-07-13 DIAGNOSIS — O0933 Supervision of pregnancy with insufficient antenatal care, third trimester: Secondary | ICD-10-CM | POA: Diagnosis not present

## 2019-07-13 DIAGNOSIS — R358 Other polyuria: Secondary | ICD-10-CM | POA: Diagnosis present

## 2019-07-13 DIAGNOSIS — F419 Anxiety disorder, unspecified: Secondary | ICD-10-CM | POA: Diagnosis present

## 2019-07-13 DIAGNOSIS — O34211 Maternal care for low transverse scar from previous cesarean delivery: Secondary | ICD-10-CM | POA: Diagnosis not present

## 2019-07-13 DIAGNOSIS — E119 Type 2 diabetes mellitus without complications: Secondary | ICD-10-CM | POA: Diagnosis present

## 2019-07-13 DIAGNOSIS — Z79899 Other long term (current) drug therapy: Secondary | ICD-10-CM | POA: Diagnosis not present

## 2019-07-13 DIAGNOSIS — O24113 Pre-existing diabetes mellitus, type 2, in pregnancy, third trimester: Secondary | ICD-10-CM | POA: Diagnosis not present

## 2019-07-13 DIAGNOSIS — O133 Gestational [pregnancy-induced] hypertension without significant proteinuria, third trimester: Secondary | ICD-10-CM | POA: Diagnosis not present

## 2019-07-13 DIAGNOSIS — D649 Anemia, unspecified: Secondary | ICD-10-CM | POA: Diagnosis present

## 2019-07-13 MED ORDER — DIPHENHYDRAMINE HCL 50 MG/ML IJ SOLN
25.0000 mg | INTRAMUSCULAR | Status: AC
  Administered 2019-07-13: 25 mg via INTRAMUSCULAR

## 2019-07-13 MED ORDER — OLANZAPINE 10 MG IM SOLR
10.0000 mg | INTRAMUSCULAR | Status: AC
  Administered 2019-07-13: 10 mg via INTRAMUSCULAR

## 2019-07-13 MED ORDER — ALUM & MAG HYDROXIDE-SIMETH 200-200-20 MG/5ML PO SUSP: 30.0000 mL | ORAL | Status: DC | PRN

## 2019-07-13 MED ORDER — DIPHENHYDRAMINE HCL 50 MG/ML IJ SOLN
50.0000 mg | Freq: Four times a day (QID) | INTRAMUSCULAR | Status: DC | PRN
  Filled 2019-07-13: qty 1

## 2019-07-13 MED ORDER — PRENATAL MULTIVITAMIN CH
1.0000 | ORAL_TABLET | Freq: Every day | ORAL | Status: DC
  Administered 2019-07-14 – 2019-07-16 (×3): 1 via ORAL
  Filled 2019-07-13 (×6): qty 1

## 2019-07-13 MED ORDER — OLANZAPINE 10 MG PO TBDP: 10.0000 mg | ORAL_TABLET | Freq: Three times a day (TID) | ORAL | Status: DC | PRN

## 2019-07-13 MED ORDER — OLANZAPINE 10 MG IM SOLR: 10.0000 mg | Freq: Two times a day (BID) | INTRAMUSCULAR | Status: DC | PRN

## 2019-07-13 MED ORDER — DIPHENHYDRAMINE HCL 25 MG PO CAPS
50.0000 mg | ORAL_CAPSULE | ORAL | Status: DC | PRN
  Administered 2019-07-14 – 2019-07-17 (×2): 50 mg via ORAL
  Filled 2019-07-13 (×2): qty 2

## 2019-07-13 MED ORDER — MELATONIN 3 MG PO TABS: 6.0000 mg | ORAL_TABLET | Freq: Every day | ORAL | 0 refills | Status: DC

## 2019-07-13 MED ORDER — LORAZEPAM 1 MG PO TABS: 1.0000 mg | ORAL_TABLET | ORAL | Status: DC | PRN

## 2019-07-13 MED ORDER — DOCUSATE SODIUM 100 MG PO CAPS: 100.0000 mg | ORAL_CAPSULE | Freq: Every day | ORAL | Status: DC | PRN

## 2019-07-13 MED ORDER — OLANZAPINE 15 MG PO TABS: 15.0000 mg | ORAL_TABLET | Freq: Every day | ORAL | 0 refills | Status: DC

## 2019-07-13 MED ORDER — OLANZAPINE 10 MG PO TBDP
10.0000 mg | ORAL_TABLET | Freq: Every day | ORAL | Status: DC
  Administered 2019-07-14: 10 mg via ORAL
  Filled 2019-07-13 (×5): qty 1

## 2019-07-13 MED ORDER — MAGNESIUM HYDROXIDE 400 MG/5ML PO SUSP: 30.0000 mL | Freq: Every day | ORAL | Status: DC | PRN

## 2019-07-13 MED ORDER — DIVALPROEX SODIUM 250 MG PO DR TAB
250.0000 mg | DELAYED_RELEASE_TABLET | Freq: Three times a day (TID) | ORAL | Status: DC
  Filled 2019-07-13 (×3): qty 1

## 2019-07-13 MED ORDER — OLANZAPINE 5 MG PO TABS
15.0000 mg | ORAL_TABLET | Freq: Every day | ORAL | Status: DC
  Filled 2019-07-13: qty 1

## 2019-07-13 MED ORDER — LORAZEPAM 2 MG/ML IJ SOLN
2.0000 mg | INTRAMUSCULAR | Status: DC | PRN
  Administered 2019-07-15: 2 mg via INTRAMUSCULAR
  Filled 2019-07-13 (×2): qty 1

## 2019-07-13 MED ORDER — ACETAMINOPHEN 325 MG PO TABS: 650.0000 mg | ORAL_TABLET | Freq: Four times a day (QID) | ORAL | Status: DC | PRN

## 2019-07-13 MED ORDER — ZIPRASIDONE MESYLATE 20 MG IM SOLR: 20.0000 mg | INTRAMUSCULAR | Status: DC | PRN

## 2019-07-13 MED ORDER — OLANZAPINE 5 MG PO TBDP
15.0000 mg | ORAL_TABLET | Freq: Every day | ORAL | Status: DC
  Filled 2019-07-13 (×2): qty 3

## 2019-07-13 MED ORDER — OLANZAPINE 10 MG PO TABS
10.0000 mg | ORAL_TABLET | Freq: Every morning | ORAL | Status: DC
  Administered 2019-07-13: 10 mg via ORAL
  Filled 2019-07-13: qty 1

## 2019-07-13 MED ORDER — HYDROXYZINE HCL 50 MG PO TABS: 50.0000 mg | ORAL_TABLET | Freq: Three times a day (TID) | ORAL | Status: DC | PRN

## 2019-07-13 MED ORDER — LORAZEPAM 1 MG PO TABS
2.0000 mg | ORAL_TABLET | ORAL | Status: DC | PRN
  Administered 2019-07-14: 2 mg via ORAL
  Filled 2019-07-13: qty 2

## 2019-07-13 MED ORDER — OLANZAPINE 10 MG PO TABS: 10.0000 mg | ORAL_TABLET | Freq: Every morning | ORAL | 0 refills | Status: DC

## 2019-07-13 MED ORDER — LORAZEPAM 2 MG/ML IJ SOLN
2.0000 mg | Freq: Once | INTRAMUSCULAR | Status: AC
  Administered 2019-07-13: 2 mg via INTRAMUSCULAR

## 2019-07-13 NOTE — Progress Notes (Signed)
Pt became very agitated and verbally abusive. Barrington Ellison MD was contacted and referred nurse to call Psych. Nurse called Psych unit and received no answer. Will continue to monitor.

## 2019-07-13 NOTE — Plan of Care (Signed)
Pt is alert to person. She is currently not of sound mind (see all other notes)

## 2019-07-13 NOTE — Progress Notes (Addendum)
10:46am-CSW received call from ED RNCM to update CSW that ED CSW had received call regarding FOB/boyfriend not being allowed to take infant or visit. CSW is aware of this and has updated CPS worker as well as Biochemist, clinical.   10:32am-CSW followed up with Neoma Laming [facility] from [facility] and was advised that they would call back once bed is ready, CSW aware that [facility] is working on staffing before [name] can arrive.   10:11am-CSW received call from CPS Worker. CSW updated CPS worker and potential FOB calling hospital asking to come and pick infant up. CSW provded J. Wardlow with contact information for him at this time. CSW updated CPS that infant would likley be ready to discharge today, however it is being requested that [facility] Parents engage in a feeding with infant prior to discharge. Per CPS, they are in contact with county that has [name]'s other daughter to see about placing infant with that family. CSW will continue to follow for other needs.   9:02am-CSW and unit AD attended meeting with [facility] via zoom call. CSW and Unit AD were both advised that [name] would be going to [facility] (Room 501) on [place]. Unti AD provided her contact information  for RN from [facility] to call in order to arrange transportation and needs. CSW will continue  to follow and offered support as needed in the event that placement changes.   CSW aware that [name] is becoming more and more agitated at this time. CSW has been in search of bed for the past two days. CSW spoke with Aqua from [facility] and was advised that at this time she is unaware of availability and would call CSW back once meeting have been held. CSW was notified that Aqua would call CSW back later with further updates.   CSW also updated that RN has been in contact with [facility]. CSW to follow back up with  [facility] In an hour [facility]. CSW aware that Custody Order has been placed on infants chart at this time. CSW to follow up with CPS for further needs. CSW ill keep searching for bed.       [name] [name], MSW, LCSW Women[facility] and [place] at [place] [phone]

## 2019-07-13 NOTE — Progress Notes (Signed)
Rn attempted to go over AVS with pt. Pt was screaming at this RN and said she could read it herself and then grabbed the papers out of the RN's hand

## 2019-07-13 NOTE — H&P (Signed)
Psychiatric Admission Assessment Adult  Patient Identification: [name] [name] MRN:  [id number] Date of Evaluation:  07/13/2019 Chief Complaint:  Psychosis [facility]) [F29] Principal Diagnosis: <principal problem not specified> Diagnosis:  Active Problems:   Psychosis ([place])  History of Present Illness: Patient is seen and examined.  Patient is a 39 year old female with a suspected past psychiatric history significant for schizoaffective disorder versus bipolar disorder with psychotic features who has been with then be [facility] system since approximately 6/27.  The patient had been admitted for involuntary commitment.  There were no rooms available for involuntary commitment at [facility] which had obstetrical services available to them.  She stated that she thinks she had been contaminated with bugs at that time.  She was also found to be pregnant with complaints of abdominal pain.  The patient had had no prenatal care.  During that time in the hospital evaluation seems that she stated she had been off "off my medications for a long time".  She has multiple allergies without great explanation.  She was angry, hostile, disorganized and tangential.  Recommended Zyprexa be started because she is not allergic to that.  She was transferred to the [facility] psychiatric unit on 07/06/2019.  Patient refused to even consider taking any kind of medication at that time.  She stated that that time she did not want to take anything that would cause any risk to her unborn child.  She was discharged that same day from [place].  The patient complained of possible leaking of gestational fluid.  An ultrasound which had been done 3 days earlier showed that the fetus was at 38 weeks and 4 days.  She had a history of a previous C-section in 2020.  Plans were for follow-up of the patient in [place] for her delivery.  She read presented to the [facility] system through emergency  medicine and labor delivery.  At that time she complained of diffuse abdominal pain.  She was evaluated by the obstetrical service, and decision was made that she was in active labor.  The child was delivered on 07/10/2019 via vaginal delivery after cesarean.  Psychiatric consultation was obtained, and the recommendation was not to have the infant in the room with the patient secondary to her clear paranoia and agitation.  Zyprexa 10 mg IM p.o. twice daily with Benadryl 50 IM was recommended as needed.  She remained in the hospital on the obstetrical ward, and transferred to the psychiatric unit was achieved on 07/13/2019.  On arrival to our facility she was significantly angry, hostile, yelling and paranoid.  She was quite delusional and disorganized.  Full mental status examination was limited, and the patient was escorted to the 500 unit where she was admitted.  The decision was made to give her 10 mg of Zyprexa IM, 2 mg of Ativan IM and 25 mg of Benadryl IM x1 to calm her down and to prevent danger to other patients, this patient as well as staff.  We have limited information on her, but at least her current presentation is consistent with schizoaffective disorder; bipolar type versus bipolar disorder, manic with psychotic features.  She was admitted to the hospital for evaluation and stabilization.  Review of the electronic medical record was limited, but it does appear that she has been admitted to [facility] previously for psychiatric reasons.  Her diagnosis per those charts include schizoaffective disorder, brief psychotic disorder, schizophrenia.  Unfortunately no other those records  had medication records.  The last admission that I was able to find was on 05/09/2017 at our facility.  Her discharge medications at that time included ziprasidone 20 mg p.o. twice daily, hydroxyzine prior to that there was admission on 10/23/2016 that showed her discharge medications including  Abilify, hydroxyzine and temazepam.  She is also being treated with Metformin at bedtime.  Associated Signs/Symptoms: Depression Symptoms:  insomnia, psychomotor agitation, difficulty concentrating, anxiety, disturbed sleep, (Hypo) Manic Symptoms:  Delusions, Elevated Mood, Hallucinations, Impulsivity, Irritable Mood, Labiality of Mood, Anxiety Symptoms:  Excessive Worry, Psychotic Symptoms:  Delusions, Hallucinations: Auditory Paranoia, PTSD Symptoms: Negative Total Time spent with patient: 1 hour  Past Psychiatric History: Patient was able to provide a limited history.  Review of the electronic medical record revealed multiple psychiatric hospitalizations.  She has had 2 hospitalizations at our facility in 2020 and 2018.  It looks like she has had multiple admissions at [facility], [place], [facility].  As best as I can tell she has previously been treated with Geodon, Abilify.  She claims multiple drug allergies with limited explanations.  The chart from 2018 revealed several admissions as well in [place].  She also apparently had been at [facility] previously.  She stated during the conversation that she was from [place], is unclear whether or not she had any psychiatric admissions in any facilities there.  She denied any previous suicide attempts according to the records in the old chart.  Is the patient at risk to self? No.  Has the patient been a risk to self in the past 6 months? No.  Has the patient been a risk to self within the distant past? No.  Is the patient a risk to others? Yes.    Has the patient been a risk to others in the past 6 months? Yes.    Has the patient been a risk to others within the distant past? Yes.     Prior Inpatient Therapy:   Prior Outpatient Therapy:    Alcohol Screening:   Substance Abuse History in the last 12 months:  No. Consequences of Substance Abuse: Negative Previous Psychotropic Medications: Yes   Psychological Evaluations: Yes  Past Medical History:  Past Medical History:  Diagnosis Date   Bipolar affective disorder, currently manic, mild ([place])    Diabetes mellitus without complication ([place])    Schizophrenia ([place])     Past Surgical History:  Procedure Laterality Date   CESAREAN SECTION  04/2018   WISDOM TOOTH EXTRACTION     Family History:  Family History  Problem Relation Age of Onset   Drug abuse Maternal Uncle    Family Psychiatric  History: Patient was a poor historian and unable to acquire this information. Tobacco Screening:   Social History:  Social History   Substance and Sexual Activity  Alcohol Use No     Social History   Substance and Sexual Activity  Drug Use No    Additional Social History:                           Allergies:   Allergies  Allergen Reactions   Quetiapine Anaphylaxis    "Pass out"   Gabapentin Other (See Comments)    "Feet on fire"   Lorazepam Other (See Comments)    unknown   Risperidone Other (See Comments)    Pt reports "It makes me blind"    Valproic Acid Other (See Comments)  Pt reports "it makes me too sleepy"    Aripiprazole Other (See Comments)    Makes patient "too tired"   Fluphenazine Rash   Haloperidol Other (See Comments)    Makes feel hot inside. "Tires my tongue"    Trazodone Other (See Comments)   Ziprasidone Hcl Palpitations   Lab Results: No results found for this or any previous visit (from the past 48 hour(s)).  Blood Alcohol level:  Lab Results  Component Value Date   ETH <10 11/19/2017   ETH <10 99/24/2683    Metabolic Disorder Labs:  Lab Results  Component Value Date   HGBA1C 5.7 (H) 07/04/2019   MPG 116.89 07/04/2019   MPG 97 10/07/2017   Lab Results  Component Value Date   PROLACTIN 46.5 (H) 07/25/2015   PROLACTIN 21.6 02/01/2015   Lab Results  Component Value Date   CHOL 182 10/07/2017   TRIG 46 10/07/2017   HDL 65 10/07/2017   CHOLHDL 2.8  10/07/2017   VLDL 9 10/07/2017   LDLCALC 108 (H) 10/07/2017   LDLCALC 73 07/25/2015    Current Medications: Current Facility-Administered Medications  Medication Dose Route Frequency Provider Last Rate Last Admin   acetaminophen (TYLENOL) tablet 650 mg  650 mg Oral Q6H PRN Sharma Covert, MD       alum & mag hydroxide-simeth (MAALOX/MYLANTA) 200-200-20 MG/5ML suspension 30 mL  30 mL Oral Q4H PRN Sharma Covert, MD       divalproex (DEPAKOTE) DR tablet 250 mg  250 mg Oral Q8H Lenix Kidd, Cordie Grice, MD       docusate sodium (COLACE) capsule 100 mg  100 mg Oral Daily PRN Sharma Covert, MD       hydrOXYzine (ATARAX/VISTARIL) tablet 50 mg  50 mg Oral TID PRN Sharma Covert, MD       magnesium hydroxide (MILK OF MAGNESIA) suspension 30 mL  30 mL Oral Daily PRN Sharma Covert, MD       OLANZapine [facility]) injection 10 mg  10 mg Intramuscular Q12H PRN Sharma Covert, MD       OLANZapine zydis (ZYPREXA) disintegrating tablet 10 mg  10 mg Oral Daily Sharma Covert, MD       OLANZapine zydis (ZYPREXA) disintegrating tablet 15 mg  15 mg Oral QHS Sharma Covert, MD       [START ON 07/14/2019] prenatal multivitamin tablet 1 tablet  1 tablet Oral Q1200 Sharma Covert, MD       PTA Medications: Medications Prior to Admission  Medication Sig Dispense Refill Last Dose   melatonin 3 MG TABS tablet Take 2 tablets (6 mg total) by mouth at bedtime. 30 tablet 0    [START ON 07/14/2019] OLANZapine (ZYPREXA) 10 MG tablet Take 1 tablet (10 mg total) by mouth in the morning. 30 tablet 0    OLANZapine (ZYPREXA) 15 MG tablet Take 1 tablet (15 mg total) by mouth at bedtime. 30 tablet 0    Prenatal Vit-Fe Fumarate-FA (PRENATAL MULTIVITAMIN) TABS tablet Take 1 tablet by mouth daily at 12 noon. 30 tablet 0     Musculoskeletal: Strength & Muscle Tone: within normal limits Gait & Station: normal Patient leans: N/A  Psychiatric Specialty Exam: Physical Exam Vitals and  nursing note reviewed.  HENT:     Head: Normocephalic and atraumatic.  Pulmonary:     Effort: Pulmonary effort is normal.  Neurological:     General: No focal deficit present.     Mental Status: She is  alert.     Review of Systems  unknown if currently breastfeeding.There is no height or weight on file to calculate BMI.  General Appearance: Disheveled  Eye Contact:  Good  Speech:  Pressured  Volume:  Increased  Mood:  Angry and Dysphoric  Affect:  Labile  Thought Process:  Disorganized and Descriptions of Associations: Loose  Orientation:  Negative  Thought Content:  Delusions, Hallucinations: Auditory, Paranoid Ideation, Rumination and Tangential  Suicidal Thoughts:  No  Homicidal Thoughts:  No  Memory:  Immediate;   Poor Recent;   Poor Remote;   Poor  Judgement:  Impaired  Insight:  Lacking  Psychomotor Activity:  Increased  Concentration:  Concentration: Poor and Attention Span: Poor  Recall:  Poor  Fund of Knowledge:  Poor  Language:  Good  Akathisia:  Negative  Handed:  Right  AIMS (if indicated):     Assets:  Desire for Improvement Resilience  ADL's:  Impaired  Cognition:  WNL  Sleep:       Treatment Plan Summary: Daily contact with patient to assess and evaluate symptoms and progress in treatment, Medication management and Plan :   Observation Level/Precautions:  15 minute checks  Laboratory:  Chemistry Profile  Psychotherapy:    Medications:    Consultations:    Discharge Concerns:    Estimated LOS:  Other:     Physician Treatment Plan for Primary Diagnosis: <principal problem not specified> Long Term Goal(s): Improvement in symptoms so as ready for discharge  Short Term Goals: Ability to identify changes in lifestyle to reduce recurrence of condition will improve, Ability to verbalize feelings will improve, Ability to demonstrate self-control will improve, Ability to identify and develop effective coping behaviors will improve, Ability to maintain  clinical measurements within normal limits will improve and Compliance with prescribed medications will improve  Physician Treatment Plan for Secondary Diagnosis: Active Problems:   Psychosis ([place])  Long Term Goal(s): Improvement in symptoms so as ready for discharge  Short Term Goals: Ability to identify changes in lifestyle to reduce recurrence of condition will improve, Ability to verbalize feelings will improve, Ability to demonstrate self-control will improve, Ability to identify and develop effective coping behaviors will improve, Ability to maintain clinical measurements within normal limits will improve and Compliance with prescribed medications will improve  I certify that inpatient services furnished can reasonably be expected to improve the patient's condition.    Sharma Covert, MD 7/7/20213:37 PM

## 2019-07-13 NOTE — Progress Notes (Signed)
CSW provided [employer] with paperwork for [name]. CSW aware that [name] has been discharged to [facility] at this time via [employer].    Virgie Dad Guenevere Roorda, MSW, LCSW [facility] and [place] at [place] [phone]

## 2019-07-13 NOTE — Progress Notes (Addendum)
12:20pm-CSW notified that [employer] are Mickel Baas and Rosalee Kaufman.   CSW received update from [facility] Disposition advising CSW that [name] as been accepted at [facility]. Accepting MD is Dr. Dwyane Dee. RN to call report to [phone] . [name] will go to [address]. CSW to call non emergent [employer] for escort to facility.    CSW also made aware that [facility] has been located at this time. CSW to get more information on family and keep staff update.     Virgie Dad Rosielee Corporan, MSW, LCSW Women[facility] and [place] at [place] [phone]

## 2019-07-13 NOTE — Progress Notes (Signed)
Pt declined 5mg  zyprexa. Stated she wants 10mg  then 15mg  at night. Disorganized thinking and rambling speech

## 2019-07-13 NOTE — Discharge Summary (Signed)
Postpartum Discharge Summary   Patient Name: [name] [name] DOB: 1980-07-25 MRN: [id number]  Date of admission: 07/10/2019 Delivery date:07/10/2019  Delivering provider: Wells Guiles R  Date of discharge: 07/13/2019  Admitting diagnosis: Labor without complication [T25] Indication for care in labor or delivery [O75.9] Intrauterine pregnancy: [redacted]w[redacted]d    Secondary diagnosis:  Active Problems:   Schizoaffective disorder, bipolar type ([place]   Labor without complication   Indication for care in labor or delivery  Additional problems:     Discharge diagnosis: Term Pregnancy Delivered                                              Post partum procedures:none Augmentation: none Complications: None  Hospital course: Onset of Labor With Vaginal Delivery      39y.o. yo G2P2002 at 328w0das admitted in Active Labor on 07/10/2019. Patient had an uncomplicated labor course as follows:  Membrane Rupture Time/Date: 12:00 PM ,07/10/2019   Delivery Method:Vaginal, Spontaneous  Episiotomy: None  Lacerations:  Periurethral  Patient had an uncomplicated postpartum course obstetrically.  She is ambulating, tolerating a regular diet, passing flatus, and urinating well. Patient is discharged home in stable condition on 07/13/19. The postpartum and peripartum period was complicated by acute/chronic psychosis. She remained intermittently agitated and can respond to redirection. She refused most medications. Zyrpexa was written but patient often refused administration. Psychiatry was consulted to help coordinate admission. Social Work heavily involved for infant and coordination of transfer.  Patient is cleared medically for psychiatric admission.    Newborn Data: Birth date:07/10/2019  Birth time:12:10 PM  Gender:Female  Living status:Living  Apgars:9 ,9  Weight:3130 g   Magnesium Sulfate received: No BMZ received: No Rhophylac:No MMR:No T-DaP: No PNC, refused  Flu:  No Transfusion:No  Physical exam  Vitals:   07/10/19 2215 07/11/19 1812 07/12/19 1730 07/13/19 0200  BP:  122/82  (!) 143/87  Pulse: 100 100  (!) 130  Resp: '18 18  18  ' Temp: 98.3 F (36.8 C) 97.9 F (36.6 C) 97.6 F (36.4 C)   TempSrc: Oral Oral Oral   SpO2: 100% 100%    Weight:      Height:       General: alert and no distress. Some what agitated but able converse and be calmed. Reports breasts are not painful or engorged with milk.  Lochia: appropriate Uterine Fundus: Patient refused exam Incision: N/A DVT Evaluation: Refused physical touch exam but denies leg pain and calves are equal without redness PYSCH: Patient is non linear in thought process. Pressured speech. Able to redirect. Responding to internal stimuli frequently during our conversation.   Labs: Lab Results  Component Value Date   WBC 6.9 07/03/2019   HGB 10.9 (L) 07/03/2019   HCT 33.0 (L) 07/03/2019   MCV 93.0 07/03/2019   PLT 212 07/03/2019   CMP Latest Ref Rng & Units 07/03/2019  Glucose 70 - 99 mg/dL 104(H)  BUN 6 - 20 mg/dL 5(L)  Creatinine 0.44 - 1.00 mg/dL 0.70  Sodium 135 - 145 mmol/L 137  Potassium 3.5 - 5.1 mmol/L 3.3(L)  Chloride 98 - 111 mmol/L 104  CO2 22 - 32 mmol/L 23  Calcium 8.9 - 10.3 mg/dL 9.1  Total Protein 6.5 - 8.1 g/dL 7.0  Total Bilirubin 0.3 - 1.2 mg/dL 0.7  Alkaline Phos 38 - 126 U/L 166(H)  AST 15 - 41 U/L 29  ALT 0 - 44 U/L 26   Edinburgh Score: No flowsheet data found.    After visit meds:  Allergies as of 07/13/2019      Reactions   Quetiapine Anaphylaxis   "Pass out"   Gabapentin Other (See Comments)   "Feet on fire"   Lorazepam Other (See Comments)   unknown   Risperidone Other (See Comments)   Pt reports "It makes me blind"   Valproic Acid Other (See Comments)   Pt reports "it makes me too sleepy"   Aripiprazole Other (See Comments)   Makes patient "too tired"   Fluphenazine Rash   Haloperidol Other (See Comments)   Makes feel hot inside. "Tires my  tongue"   Trazodone Other (See Comments)   Ziprasidone Hcl Palpitations      Medication List    TAKE these medications   melatonin 3 MG Tabs tablet Take 2 tablets (6 mg total) by mouth at bedtime.   OLANZapine 15 MG tablet Commonly known as: ZYPREXA Take 1 tablet (15 mg total) by mouth at bedtime.   OLANZapine 10 MG tablet Commonly known as: ZYPREXA Take 1 tablet (10 mg total) by mouth in the morning. Start taking on: July 14, 2019   prenatal multivitamin Tabs tablet Take 1 tablet by mouth daily at 12 noon.        Discharge to inpatient psychiatric facility  in stable condition Infant Feeding: Formula Infant Disposition:Nursery- custody taken by CPS Discharge instruction: per After Visit Summary and Postpartum booklet. Activity: Advance as tolerated. Pelvic rest for 6 weeks.  Diet: routine diet Anticipated Birth Control: Patient refuses contraception today. She reports she "only gets pregnant when she wants to and asks God for a baby." Postpartum Appointment:4 weeks Additional Postpartum F/U: Will be admited to inpatient pyschiatric facility.  Future Appointments:No future appointments. Follow up Visit:  [place] for [place] at [facility] Follow up in 4 week(s).   Specialty: Obstetrics and Gynecology Contact information: [place] [phone]                  07/13/2019 Caren Macadam, MD

## 2019-07-13 NOTE — BHH Suicide Risk Assessment (Signed)
[facility] Admission Suicide Risk Assessment   Nursing information obtained from:    Demographic factors:    Current Mental Status:    Loss Factors:    Historical Factors:    Risk Reduction Factors:     Total Time spent with patient: 45 minutes Principal Problem: <principal problem not specified> Diagnosis:  Active Problems:   Psychosis (HCC)  Subjective Data: Patient is seen and examined.  Patient is a 39 year old female with a suspected past psychiatric history significant for schizoaffective disorder versus bipolar disorder with psychotic features who has been with then be [place] care system since approximately 6/27.  The patient had been admitted for involuntary commitment.  There were no rooms available for involuntary commitment at [facility] which had obstetrical services available to them.  She stated that she thinks she had been contaminated with bugs at that time.  She was also found to be pregnant with complaints of abdominal pain.  The patient had had no prenatal care.  During that time in the hospital evaluation seems that she stated she had been off "off my medications for a long time".  She has multiple allergies without great explanation.  She was angry, hostile, disorganized and tangential.  Recommended Zyprexa be started because she is not allergic to that.  She was transferred to the [facility] psychiatric unit on 07/06/2019.  Patient refused to even consider taking any kind of medication at that time.  She stated that that time she did not want to take anything that would cause any risk to her unborn child.  She was discharged that same day from [place].  The patient complained of possible leaking of gestational fluid.  An ultrasound which had been done 3 days earlier showed that the fetus was at 38 weeks and 4 days.  She had a history of a previous C-section in 2020.  Plans were for follow-up of the patient in [place] for her delivery.   She read presented to the [facility] system through emergency medicine and labor delivery.  At that time she complained of diffuse abdominal pain.  She was evaluated by the obstetrical service, and decision was made that she was in active labor.  The child was delivered on 07/10/2019 via vaginal delivery after cesarean.  Psychiatric consultation was obtained, and the recommendation was not to have the infant in the room with the patient secondary to her clear paranoia and agitation.  Zyprexa 10 mg IM p.o. twice daily with Benadryl 50 IM was recommended as needed.  She remained in the hospital on the obstetrical ward, and transferred to the psychiatric unit was achieved on 07/13/2019.  On arrival to our facility she was significantly angry, hostile, yelling and paranoid.  She was quite delusional and disorganized.  Full mental status examination was limited, and the patient was escorted to the 500 unit where she was admitted.  The decision was made to give her 10 mg of Zyprexa IM, 2 mg of Ativan IM and 25 mg of Benadryl IM x1 to calm her down and to prevent danger to other patients, this patient as well as staff.  We have limited information on her, but at least her current presentation is consistent with schizoaffective disorder; bipolar type versus bipolar disorder, manic with psychotic features.  She was admitted to the hospital for evaluation and stabilization.  Review of the electronic medical record was limited, but it does appear that she has been admitted to [facility], [facility],  [facility] previously for psychiatric reasons.  Her diagnosis per those charts include schizoaffective disorder, brief psychotic disorder, schizophrenia.  Unfortunately no other those records had medication records.  The last admission that I was able to find was on 05/09/2017 at our facility.  Her discharge medications at that time included ziprasidone 20 mg p.o. twice daily, hydroxyzine prior to that there was admission on  10/23/2016 that showed her discharge medications including Abilify, hydroxyzine and temazepam.  She is also being treated with Metformin at bedtime.  Continued Clinical Symptoms:    The "Alcohol Use Disorders Identification Test", Guidelines for Use in Primary Care, Second Edition.  World Pharmacologist [facility]). Score between 0-7:  no or low risk or alcohol related problems. Score between 8-15:  moderate risk of alcohol related problems. Score between 16-19:  high risk of alcohol related problems. Score 20 or above:  warrants further diagnostic evaluation for alcohol dependence and treatment.   CLINICAL FACTORS:   Bipolar Disorder:   Mixed State Schizophrenia:   Less than 60 years old Paranoid or undifferentiated type   Musculoskeletal: Strength & Muscle Tone: within normal limits Gait & Station: normal Patient leans: N/A  Psychiatric Specialty Exam: Physical Exam Vitals and nursing note reviewed.  HENT:     Head: Normocephalic and atraumatic.  Pulmonary:     Effort: Pulmonary effort is normal.  Neurological:     General: No focal deficit present.     Mental Status: She is alert.     Review of Systems  unknown if currently breastfeeding.There is no height or weight on file to calculate BMI.  General Appearance: Disheveled  Eye Contact:  Good  Speech:  Pressured  Volume:  Increased  Mood:  Angry and Dysphoric  Affect:  Labile  Thought Process:  Disorganized and Descriptions of Associations: Loose  Orientation:  Negative  Thought Content:  Delusions, Paranoid Ideation, Rumination and Tangential  Suicidal Thoughts:  No  Homicidal Thoughts:  No  Memory:  Immediate;   Poor Recent;   Poor Remote;   Poor  Judgement:  Impaired  Insight:  Lacking  Psychomotor Activity:  Increased  Concentration:  Concentration: Poor and Attention Span: Poor  Recall:  Poor  Fund of Knowledge:  Poor  Language:  Poor  Akathisia:  Negative  Handed:  Right  AIMS (if indicated):      Assets:  Desire for Improvement Resilience  ADL's:  Impaired  Cognition:  WNL  Sleep:         COGNITIVE FEATURES THAT CONTRIBUTE TO RISK:  Closed-mindedness    SUICIDE RISK:   Mild:  Suicidal ideation of limited frequency, intensity, duration, and specificity.  There are no identifiable plans, no associated intent, mild dysphoria and related symptoms, good self-control (both objective and subjective assessment), few other risk factors, and identifiable protective factors, including available and accessible social support.  PLAN OF CARE: Patient is seen and examined.  Patient is a 39 year old female with the above-stated past psychiatric history who was admitted secondary to agitation, paranoia, threatening behavior.  She will be admitted to the hospital.  She will be integrated in the milieu.  She will be encouraged to attend groups.  We have already given her 10 mg of Zyprexa IM x1 and also Ativan 2 mg IM with Benadryl 25 mg.  She was previously written for Zyprexa 10 mg p.o. daily and 50 mg p.o. nightly.  This will be continued.  We will also continue the Zyprexa as needed.  She claims multiple allergies to  medications.  She did state that she had previously taken Depakote.  I will start her on Depakote 250 mg p.o. 3 times daily.  Other medication she was on during the obstetrical service was Dulcolax and out of the continue.  Review of her laboratories from 6/27 revealed a mildly low potassium, normal liver function enzymes and otherwise normal electrolytes.  Her CBC showed a slight anemia with a hemoglobin of 10.9 and hematocrit of 33.  Her platelets were 212,000.  She had a previous history of taking Metformin, but her blood sugar on 6/27 was 104.  Her hemoglobin A1c was 5.7.  RPR was nonreactive.  Hepatitis B was nonreactive.  HIV was nonreactive.  Drug screen from 6/27 was completely negative.  I certify that inpatient services furnished can reasonably be expected to improve the patient's  condition.   Sharma Covert, MD 07/13/2019, 3:13 PM

## 2019-07-13 NOTE — Progress Notes (Signed)
Pt stated that the holy ghost told her to take her medications. Pt requested both 10 and 5 mg zyprexa because she didn't want to go against the holy ghost.

## 2019-07-13 NOTE — Progress Notes (Addendum)
CSW observed that [name] is very upset standing in door way of room yelling. [name] reported to [employer] officer "dont cross this line, ill ill break your glasses". [name] very upset with RN reporting "I hope God curses you for your mind to go blank". [name] yelling "I told you I don't take medicine. Yall cant supervise those. Yall are no experts". [name] clapping hands and yelling about medications. [name] still speaking to herself and yelling uncontrollably. [name] cursing at staff and yelling "you are not anything to me but a nurse, and you are not allowed to give me medicine. I need to see the judge". [name] speaking in loud tone and having disorganized thoughts.    [name] [name], MSW, LCSW [facility] [place] [facility] [place] [phone]

## 2019-07-13 NOTE — Progress Notes (Signed)
Pt is a 39 year old female who was involuntarily admitted to [facility] after having been a pt on the Mother Baby Unit at [facility].  Pt had a baby on 07/10/2019.  Pt has a long hx of mental health issues.  Pt presented at [facility] with paranoia, psychosis, delusional thinking and severe agitation.  Pt was combative, yelling and screaming, throwing things across the room.  Pt was given IM ativan, benadryl and zyprexa.  Assessment was limited due to pt's mental state and combativeness.    Pt is currently resting at this time.  Q 15 min checks have been initiated.

## 2019-07-13 NOTE — Progress Notes (Signed)
Pt loud and verbally abusive. She is rambling from topic to topic and states that I tried to kill her with the zyprexa. "I command you to hear, stop medicating me...". RN replied with I gave you the medicine because the holy spirit told you to take it". Pt stated that God tried to kill her and it was my fault.  Pt said that zyprexa feels like clapping in her head and it took her 15 minutes to wake up. Pt states that she told this RN that zyprexa was not her medication. Pt screaming at this RN at the door and threatening this RN and the security guard.

## 2019-07-13 NOTE — Progress Notes (Addendum)
Report called to Will Hilma Favors at [facility].  Since report pt pulling the code blue and emergency cords in the bathroom. Pt demands to go home and see her daughter. "Don't you Goddam idiots come close! Don't you cross this line!" (threshold of pt's room) When it the judge coming?'"  This RN printed off dc paperwork. Rn will attempt to go over dc paper work before transport

## 2019-07-13 NOTE — Progress Notes (Signed)
Pt was sitting up on the sofa when I arrived; she was awake and alert; she was on the phone talking but soon ended the call. She initially asked me to leave saying the Reita Cliche said I wasn't supposed to be there.  She continued to talk and was talking very loud. I asked her to talk softer and initially it upset her but she complied. We had a good visit which included reading the book of Eben Burow, talking about her daughter and husband, talking about God. She said she would stay her and then go to [facility].  Her conversation, for lack of better words, was all over the place.  She said she was going to die in September; she said she knows how to take care of her children and wants them back. She spoke briefly of her deceased mom.  We read the Bible together and prayed together.  She was very grateful for the visit.  She said people pick on her and laugh at her wig. We talked about Psalm 139 (fearfully, wonderfully, marvelously made) and she really the reference.  I provided listening support, spiritual support, prayer and empathic care. Please page if additional support is needed.   07/13/19 0100  Clinical Encounter Type  Visited With Patient   Chaplain Marlise Eves, MDiv

## 2019-07-13 NOTE — Discharge Summary (Signed)
RNCM received call from EDSW regarding not letting pt boyfriend/baby father visit and or take child.  RNCM relayed information to [facility] Jeanette Caprice), who was already aware of situation and events surrounding pt.

## 2019-07-14 MED ORDER — METOPROLOL SUCCINATE ER 25 MG PO TB24
25.0000 mg | ORAL_TABLET | Freq: Every day | ORAL | Status: DC
  Administered 2019-07-15 – 2019-07-19 (×3): 25 mg via ORAL
  Filled 2019-07-14 (×7): qty 1

## 2019-07-14 MED ORDER — OLANZAPINE 5 MG PO TBDP
5.0000 mg | ORAL_TABLET | Freq: Every day | ORAL | Status: DC
  Administered 2019-07-15 – 2019-07-16 (×2): 5 mg via ORAL
  Filled 2019-07-14 (×4): qty 1

## 2019-07-14 MED ORDER — OLANZAPINE 10 MG PO TBDP
20.0000 mg | ORAL_TABLET | Freq: Every day | ORAL | Status: DC
  Filled 2019-07-14 (×2): qty 2

## 2019-07-14 MED ORDER — OLANZAPINE 10 MG PO TBDP
10.0000 mg | ORAL_TABLET | Freq: Every day | ORAL | Status: DC
  Administered 2019-07-14: 10 mg via ORAL
  Filled 2019-07-14 (×2): qty 1

## 2019-07-14 MED ORDER — DIVALPROEX SODIUM ER 500 MG PO TB24
500.0000 mg | ORAL_TABLET | Freq: Every day | ORAL | Status: DC
  Administered 2019-07-14 – 2019-07-16 (×3): 500 mg via ORAL
  Filled 2019-07-14 (×7): qty 1

## 2019-07-14 NOTE — Progress Notes (Signed)
[facility] MD Progress Note  07/14/2019 10:01 AM [name] [name]  MRN:  [id number] Subjective: Patient is a 39 year old female with a past psychiatric history significant for schizoaffective disorder versus schizophrenia who was admitted on 07/13/2019 secondary to paranoia, agitation, visual hallucinations.  Objective: Patient is seen and examined.  Patient is a 39 year old female with the above-stated past psychiatric history who is seen in follow-up.  She still very irritable today, but not to the degree as she was yesterday afternoon.  She is intrusive and angry, but far less loud than she had been.  She did fall asleep yesterday after receiving the Zyprexa/Benadryl/Ativan injection.  She continues to be delusional and paranoid.  She stated she has a court hearing today that she has to be at.  She refuses to discuss any of her previous admissions to various hospitals.  She stated "I am not going to answer anything about the past".  She continues to ruminate on going to [place] as soon as she is released.  The last blood pressure that we have on her is from last night, it was 143/87.  Her pulse was 130.  Sleep was reported 10.25 hours.  No new laboratories this morning.  She denied any suicidal or homicidal ideation, but continues to be quite threatening.  Principal Problem: <principal problem not specified> Diagnosis: Active Problems:   Psychosis ([place])  Total Time spent with patient: 20 minutes  Past Psychiatric History: See admission H&P  Past Medical History:  Past Medical History:  Diagnosis Date  . Bipolar affective disorder, currently manic, mild ([place])   . Diabetes mellitus without complication ([place])   . Schizophrenia [facility])     Past Surgical History:  Procedure Laterality Date  . CESAREAN SECTION  04/2018  . WISDOM TOOTH EXTRACTION     Family History:  Family History  Problem Relation Age of Onset  . Drug abuse Maternal Uncle    Family Psychiatric  History: See admission  H&P Social History:  Social History   Substance and Sexual Activity  Alcohol Use No     Social History   Substance and Sexual Activity  Drug Use No    Social History   Socioeconomic History  . Marital status: Legally Separated    Spouse name: Not on file  . Number of children: Not on file  . Years of education: Not on file  . Highest education level: Not on file  Occupational History  . Not on file  Tobacco Use  . Smoking status: Never Smoker  . Smokeless tobacco: Never Used  Vaping Use  . Vaping Use: Never used  Substance and Sexual Activity  . Alcohol use: No  . Drug use: No  . Sexual activity: Not Currently  Other Topics Concern  . Not on file  Social History Narrative  . Not on file   Social Determinants of Health   Financial Resource Strain:   . Difficulty of Paying Living Expenses:   Food Insecurity:   . Worried About Charity fundraiser in the Last Year:   . Arboriculturist in the Last Year:   Transportation Needs:   . Film/video editor (Medical):   Marland Kitchen Lack of Transportation (Non-Medical):   Physical Activity:   . Days of Exercise per Week:   . Minutes of Exercise per Session:   Stress:   . Feeling of Stress :   Social Connections:   . Frequency of Communication with Friends and Family:   . Frequency of Social  Gatherings with Friends and Family:   . Attends Religious Services:   . Active Member of Clubs or Organizations:   . Attends Archivist Meetings:   Marland Kitchen Marital Status:    Additional Social History:                         Sleep: Good  Appetite:  Fair  Current Medications: Current Facility-Administered Medications  Medication Dose Route Frequency Provider Last Rate Last Admin  . acetaminophen (TYLENOL) tablet 650 mg  650 mg Oral Q6H PRN Sharma Covert, MD      . alum & mag hydroxide-simeth (MAALOX/MYLANTA) 200-200-20 MG/5ML suspension 30 mL  30 mL Oral Q4H PRN Sharma Covert, MD      . diphenhydrAMINE  (BENADRYL) capsule 50 mg  50 mg Oral Q4H PRN Sharma Covert, MD   50 mg at 07/14/19 9629   Or  . diphenhydrAMINE (BENADRYL) injection 50 mg  50 mg Intramuscular QID PRN Sharma Covert, MD      . divalproex (DEPAKOTE ER) 24 hr tablet 500 mg  500 mg Oral Daily Sharma Covert, MD      . docusate sodium (COLACE) capsule 100 mg  100 mg Oral Daily PRN Sharma Covert, MD      . hydrOXYzine (ATARAX/VISTARIL) tablet 50 mg  50 mg Oral TID PRN Sharma Covert, MD      . LORazepam (ATIVAN) tablet 2 mg  2 mg Oral Q4H PRN Sharma Covert, MD   2 mg at 07/14/19 5284   Or  . LORazepam (ATIVAN) injection 2 mg  2 mg Intramuscular Q4H PRN Sharma Covert, MD      . OLANZapine zydis (ZYPREXA) disintegrating tablet 10 mg  10 mg Oral Q8H PRN Sharma Covert, MD       And  . LORazepam (ATIVAN) tablet 1 mg  1 mg Oral PRN Sharma Covert, MD       And  . ziprasidone (GEODON) injection 20 mg  20 mg Intramuscular PRN Sharma Covert, MD      . magnesium hydroxide (MILK OF MAGNESIA) suspension 30 mL  30 mL Oral Daily PRN Sharma Covert, MD      . OLANZapine zydis (ZYPREXA) disintegrating tablet 10 mg  10 mg Oral Daily Sharma Covert, MD   10 mg at 07/14/19 1324  . OLANZapine zydis (ZYPREXA) disintegrating tablet 20 mg  20 mg Oral QHS Sharma Covert, MD      . prenatal multivitamin tablet 1 tablet  1 tablet Oral Q1200 Sharma Covert, MD        Lab Results: No results found for this or any previous visit (from the past 48 hour(s)).  Blood Alcohol level:  Lab Results  Component Value Date   ETH <10 11/19/2017   ETH <10 40/10/2723    Metabolic Disorder Labs: Lab Results  Component Value Date   HGBA1C 5.7 (H) 07/04/2019   MPG 116.89 07/04/2019   MPG 97 10/07/2017   Lab Results  Component Value Date   PROLACTIN 46.5 (H) 07/25/2015   PROLACTIN 21.6 02/01/2015   Lab Results  Component Value Date   CHOL 182 10/07/2017   TRIG 46 10/07/2017   HDL 65 10/07/2017    CHOLHDL 2.8 10/07/2017   VLDL 9 10/07/2017   LDLCALC 108 (H) 10/07/2017   LDLCALC 73 07/25/2015    Physical Findings: AIMS:  , ,  ,  ,  CIWA:    COWS:     Musculoskeletal: Strength & Muscle Tone: within normal limits Gait & Station: normal Patient leans: N/A  Psychiatric Specialty Exam: Physical Exam Vitals and nursing note reviewed.  HENT:     Head: Normocephalic and atraumatic.  Pulmonary:     Effort: Pulmonary effort is normal.  Neurological:     General: No focal deficit present.     Mental Status: She is alert and oriented to person, place, and time.     Review of Systems  Pulse (!) 110, SpO2 97 %, unknown if currently breastfeeding.There is no height or weight on file to calculate BMI.  General Appearance: Disheveled  Eye Contact:  Good  Speech:  Pressured  Volume:  Increased  Mood:  Dysphoric and Irritable  Affect:  Labile  Thought Process:  Goal Directed and Descriptions of Associations: Tangential  Orientation:  Negative  Thought Content:  Delusions, Hallucinations: Auditory, Paranoid Ideation, Rumination and Tangential  Suicidal Thoughts:  No  Homicidal Thoughts:  No  Memory:  Immediate;   Poor Recent;   Poor Remote;   Poor  Judgement:  Impaired  Insight:  Lacking  Psychomotor Activity:  Increased  Concentration:  Concentration: Poor and Attention Span: Poor  Recall:  Poor  Fund of Knowledge:  Poor  Language:  Fair  Akathisia:  Negative  Handed:  Right  AIMS (if indicated):     Assets:  Desire for Improvement Resilience  ADL's:  Intact  Cognition:  WNL  Sleep:  Number of Hours: 10.25     Treatment Plan Summary: Daily contact with patient to assess and evaluate symptoms and progress in treatment, Medication management and Plan : Patient is seen and examined.  Patient is a 39 year old female with the above-stated past psychiatric history who is seen in follow-up.   Diagnosis: 1.  Schizoaffective disorder; bipolar type versus schizophrenia  versus bipolar disorder; manic with psychotic features 2.  Recent vaginal delivery of normal child. 3.  Essential hypertension 4.  Mild anemia  Pertinent findings on examination today: 1.  Continued paranoia, delusional thinking, intrusiveness, anger/hostility.  Plan: 1.  Increase Zyprexa Zydis to 20 mg p.o. nightly for psychosis and sleep as well as mood stability. 2.  Change Depakote DR to Depakote ER 500 mg p.o. daily for mood stability 3.  Continue as needed Benadryl, lorazepam and injectable Zyprexa 4.  Start metoprolol ER 25 mg for hypertension 5.  Disposition planning-in progress.  Sharma Covert, MD 07/14/2019, 10:01 AM

## 2019-07-14 NOTE — Progress Notes (Signed)
Dar Note: Patient presents with irritable affect and mood.  Became loud, yelling at staff at the start of shift.  Still disorganized, delusional and paranoid.  Medication given after several attempts and encouragement.  Routine safety checks maintained every 15 minutes.  Benadryl 50 mg and Ativan 2 mg given for severe agitation with good effect.  Patient is safe on the unit.

## 2019-07-14 NOTE — Progress Notes (Signed)
Recreation Therapy Notes  Date: 7.8.21 Time: 0930 Location: 500 Hall Dayroom  Group Topic: Coping Skills  Goal Area(s) Addresses:  Patient will identify positive coping skills. Patient will identify benefit of using coping skills post d/c.  Intervention: Game  Activity: Scientist, clinical (histocompatibility and immunogenetics).  Patients and LRT played Jenga according to the rules. However, each time a person pulled a jenga piece, they had to name a positive coping skill.  Education: Radiographer, therapeutic, Dentist.   Education Outcome: Acknowledges understanding/In group clarification offered/Needs additional education.   Clinical Observations/Feedback: Pt did not attend group session.     Victorino Sparrow, LRT/CTRS         Victorino Sparrow A 07/14/2019 12:02 PM

## 2019-07-15 LAB — URINALYSIS, COMPLETE (UACMP) WITH MICROSCOPIC
Bacteria, UA: NONE SEEN
Bilirubin Urine: NEGATIVE
Glucose, UA: NEGATIVE mg/dL
Hgb urine dipstick: NEGATIVE
Ketones, ur: 5 mg/dL — AB
Leukocytes,Ua: NEGATIVE
Nitrite: NEGATIVE
Protein, ur: NEGATIVE mg/dL
Specific Gravity, Urine: 1.016 (ref 1.005–1.030)
pH: 7 (ref 5.0–8.0)

## 2019-07-15 MED ORDER — OLANZAPINE 5 MG PO TBDP
15.0000 mg | ORAL_TABLET | Freq: Every day | ORAL | Status: DC
  Administered 2019-07-15: 15 mg via ORAL
  Filled 2019-07-15 (×3): qty 3

## 2019-07-15 MED ORDER — SPIRONOLACTONE 25 MG PO TABS
12.5000 mg | ORAL_TABLET | Freq: Every day | ORAL | Status: DC
  Administered 2019-07-15 – 2019-07-19 (×3): 12.5 mg via ORAL
  Filled 2019-07-15 (×4): qty 1
  Filled 2019-07-15: qty 4
  Filled 2019-07-15 (×3): qty 1

## 2019-07-15 MED ORDER — HYDROCERIN EX CREA
TOPICAL_CREAM | Freq: Two times a day (BID) | CUTANEOUS | Status: DC
  Administered 2019-07-15: 1 via TOPICAL
  Filled 2019-07-15: qty 113

## 2019-07-15 MED ORDER — ENSURE ENLIVE PO LIQD
237.0000 mL | Freq: Two times a day (BID) | ORAL | Status: DC
  Administered 2019-07-15 – 2019-07-17 (×5): 237 mL via ORAL

## 2019-07-15 NOTE — Progress Notes (Signed)
Patient was asleep earlier and when she woke up she requested a cane because she was crippled and wanted her medicine brought to her. She was standing making up her bed while talking about being crippled. She reports that black people are very mean to her and she does not understand why writer is giving her these medicines. When writer attempted to tell her the medication she was receiving she replied "don't talk to me just give me the medicine." She took her medicine and returned to her room after she finished talking to our [facility]. She is labile and becomes easily agitated.

## 2019-07-15 NOTE — BHH Suicide Risk Assessment (Signed)
[place] INPATIENT:  Family/Significant Other Suicide Prevention Education  Suicide Prevention Education:  Contact Attempts: [name] [employer], (name of family member/significant other) has been identified by the patient as the family member/significant other with whom the patient will be residing, and identified as the person(s) who will aid the patient in the event of a mental health crisis.  With written consent from the patient, two attempts were made to provide suicide prevention education, prior to and/or following the patient's discharge.  We were unsuccessful in providing suicide prevention education.  A suicide education pamphlet was given to the patient to share with family/significant other.  Date and time of first attempt:08/04/19 38pm CSW called uncle. He request call back on different day due to being at funeral.  Date and time of second attempt:  [name] 07/15/2019, 4:29 PM

## 2019-07-15 NOTE — BHH Counselor (Signed)
CSW made an attempt to get patient's email address to be used by the follow-up provider. Patient stated she prefers not communicate by email and requested all correspondence be either verbal or written. Scheduler Donnie Coffin was able to refer patient  to the [place] and instructed the patient share her email with this provider if she deems it a means of communication she wants to utilize.

## 2019-07-15 NOTE — Progress Notes (Signed)
   07/15/19 1004  Psych Admission Type (Psych Patients Only)  Admission Status Involuntary  Psychosocial Assessment  Patient Complaints Agitation;Irritability  Eye Contact Glaring;Intense;Suspiciousness  Facial Expression Pensive;Wide-eyed;Grimacing  Affect Angry;Irritable;Sad  Speech Aggressive;Argumentative;Rapid;Pressured;Loud  Interaction Defensive;Demanding;Hostile;Intrusive  Motor Activity Hyperactive;Pacing  Appearance/Hygiene Unremarkable  Behavior Characteristics Appropriate to situation  Mood Labile;Depressed  Thought Process  Coherency Blocking  Content Blaming others;Delusions;Paranoia  Delusions Religious  Perception WDL  Hallucination None reported or observed  Judgment Impaired  Confusion Mild  Danger to Self  Current suicidal ideation? Denies  Danger to Others  Danger to Others None reported or observed   Pt visible in dayroom and hall majority of this shift. Presents very irritable, demanding, defensive and verbally abusive on interactions with staff. Urine sample obtained as ordered. Pt compliant with medications with increase verbal encouragement. Denies SI, HI, AVH and pain when assessed. Q 15 minutes safety checks maintained without self harm gestures or outburst. Emotional support offered. All medications administered as ordered and effects monitored. Q 15 minutes safety checks maintained.  Pt tolerated all PO intake well. Awake in hall at this time without concerns.
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
## 2019-07-15 NOTE — BHH Suicide Risk Assessment (Signed)
[name] [name] INPATIENT:  Family/Significant Other Suicide Prevention Education  Suicide Prevention Education:  Education Completed; Engineer, site (pastor),  (name of family member/significant other) has been identified by the patient as the family member/significant other with whom the patient will be residing, and identified as the person(s) who will aid the patient in the event of a mental health crisis (suicidal ideations/suicide attempt).  With written consent from the patient, the family member/significant other has been provided the following suicide prevention education, prior to the and/or following the discharge of the patient.  The suicide prevention education provided includes the following:  Suicide risk factors  Suicide prevention and interventions  National Suicide Hotline telephone number  [facility] assessment telephone number  [facility] Emergency Assistance [place] and/or Residential Mobile Crisis Unit telephone number  Request made of family/significant other to:  Remove weapons (e.g., guns, rifles, knives), all items previously/currently identified as safety concern.    Remove drugs/medications (over-the-counter, prescriptions, illicit drugs), all items previously/currently identified as a safety concern.  The family member/significant other verbalizes understanding of the suicide prevention education information provided.  The family member/significant other agrees to remove the items of safety concern listed above.   Update provided to pastor. [name] [name] may be able to pick pt up upon discharge.   [name] [name] 07/15/2019, 4:26 PM

## 2019-07-15 NOTE — Progress Notes (Signed)
[facility] MD Progress Note  07/15/2019 12:21 PM [name] [name]  MRN:  [id number] Subjective:  Patient is a 39 year old female with a past psychiatric history significant for schizoaffective disorder versus schizophrenia who was admitted on 07/13/2019 secondary to paranoia, agitation, visual hallucinations.  Objective: Patient is seen and examined.  Patient is a 39 year old female with the above-stated past psychiatric history who is seen in follow-up.  Her irritability has decreased, but she can be a bit prickly.  She was compliant with her medicines, and slept better last night.  She is far less verbally abusive, less intrusive, but continues to complain intermittently of oversedation from every medication.  She has several request today.  She does have severe hirsutism, and would like to shave.  I have asked the nurses to help with that, and whether or not we have the access to a cream like Carlton Adam, and there is a prescription medication for hirsutism that would be helpful.  I am going to start her on Aldactone but that may take several weeks to months to really be helpful.  She denied any auditory or visual hallucinations.  She continues to have some delusional thinking, but much less prominent than it was.  Her paranoia seems to have decreased as well.  Her vital signs are stable, she is afebrile.  She did sleep 5.5 hours last night.  She also of polyuria.  That is most likely secondary to her recent delivery.  We will repeat a urinalysis on that.  Principal Problem: <principal problem not specified> Diagnosis: Active Problems:   Psychosis ([place])  Total Time spent with patient: 20 minutes  Past Psychiatric History: See admission H&P  Past Medical History:  Past Medical History:  Diagnosis Date   Bipolar affective disorder, currently manic, mild ([place])    Diabetes mellitus without complication ([place])    Schizophrenia ([place])     Past Surgical History:  Procedure Laterality Date   CESAREAN  SECTION  04/2018   WISDOM TOOTH EXTRACTION     Family History:  Family History  Problem Relation Age of Onset   Drug abuse Maternal Uncle    Family Psychiatric  History: See admission H&P Social History:  Social History   Substance and Sexual Activity  Alcohol Use No     Social History   Substance and Sexual Activity  Drug Use No    Social History   Socioeconomic History   Marital status: Legally Separated    Spouse name: Not on file   Number of children: Not on file   Years of education: Not on file   Highest education level: Not on file  Occupational History   Not on file  Tobacco Use   Smoking status: Never Smoker   Smokeless tobacco: Never Used  Vaping Use   Vaping Use: Never used  Substance and Sexual Activity   Alcohol use: No   Drug use: No   Sexual activity: Not Currently  Other Topics Concern   Not on file  Social History Narrative   Not on file   Social Determinants of Health   Financial Resource Strain:    Difficulty of Paying Living Expenses:   Food Insecurity:    Worried About Charity fundraiser in the Last Year:    Arboriculturist in the Last Year:   Transportation Needs:    Film/video editor (Medical):    Lack of Transportation (Non-Medical):   Physical Activity:    Days of Exercise per Week:  Minutes of Exercise per Session:   Stress:    Feeling of Stress :   Social Connections:    Frequency of Communication with Friends and Family:    Frequency of Social Gatherings with Friends and Family:    Attends Religious Services:    Active Member of Clubs or Organizations:    Attends Archivist Meetings:    Marital Status:    Additional Social History:                         Sleep: Fair  Appetite:  Fair  Current Medications: Current Facility-Administered Medications  Medication Dose Route Frequency Provider Last Rate Last Admin   acetaminophen (TYLENOL) tablet 650 mg  650 mg  Oral Q6H PRN Sharma Covert, MD       alum & mag hydroxide-simeth (MAALOX/MYLANTA) 200-200-20 MG/5ML suspension 30 mL  30 mL Oral Q4H PRN Sharma Covert, MD       diphenhydrAMINE (BENADRYL) capsule 50 mg  50 mg Oral Q4H PRN Sharma Covert, MD   50 mg at 07/14/19 7371   Or   diphenhydrAMINE (BENADRYL) injection 50 mg  50 mg Intramuscular QID PRN Sharma Covert, MD       divalproex (DEPAKOTE ER) 24 hr tablet 500 mg  500 mg Oral Daily Sharma Covert, MD   500 mg at 07/15/19 0626   docusate sodium (COLACE) capsule 100 mg  100 mg Oral Daily PRN Sharma Covert, MD       hydrOXYzine (ATARAX/VISTARIL) tablet 50 mg  50 mg Oral TID PRN Sharma Covert, MD       LORazepam (ATIVAN) tablet 2 mg  2 mg Oral Q4H PRN Sharma Covert, MD   2 mg at 07/14/19 9485   Or   LORazepam (ATIVAN) injection 2 mg  2 mg Intramuscular Q4H PRN Sharma Covert, MD       OLANZapine zydis (ZYPREXA) disintegrating tablet 10 mg  10 mg Oral Q8H PRN Sharma Covert, MD       And   LORazepam (ATIVAN) tablet 1 mg  1 mg Oral PRN Sharma Covert, MD       And   ziprasidone (GEODON) injection 20 mg  20 mg Intramuscular PRN Sharma Covert, MD       magnesium hydroxide (MILK OF MAGNESIA) suspension 30 mL  30 mL Oral Daily PRN Sharma Covert, MD       metoprolol succinate (TOPROL-XL) 24 hr tablet 25 mg  25 mg Oral Daily Sharma Covert, MD   25 mg at 07/15/19 0735   OLANZapine zydis (ZYPREXA) disintegrating tablet 15 mg  15 mg Oral QHS Sharma Covert, MD       OLANZapine zydis (ZYPREXA) disintegrating tablet 5 mg  5 mg Oral Daily Sharma Covert, MD   5 mg at 07/15/19 0736   prenatal multivitamin tablet 1 tablet  1 tablet Oral Q1200 Sharma Covert, MD   1 tablet at 07/14/19 1124   spironolactone (ALDACTONE) tablet 12.5 mg  12.5 mg Oral Daily Sharma Covert, MD        Lab Results: No results found for this or any previous visit (from the past 5  hour(s)).  Blood Alcohol level:  Lab Results  Component Value Date   [facility] <10 11/19/2017   ETH <10 46/27/0350    Metabolic Disorder Labs: Lab Results  Component Value Date   HGBA1C 5.7 (H)  07/04/2019   MPG 116.89 07/04/2019   MPG 97 10/07/2017   Lab Results  Component Value Date   PROLACTIN 46.5 (H) 07/25/2015   PROLACTIN 21.6 02/01/2015   Lab Results  Component Value Date   CHOL 182 10/07/2017   TRIG 46 10/07/2017   HDL 65 10/07/2017   CHOLHDL 2.8 10/07/2017   VLDL 9 10/07/2017   LDLCALC 108 (H) 10/07/2017   LDLCALC 73 07/25/2015    Physical Findings: AIMS:  , ,  ,  ,    CIWA:    COWS:     Musculoskeletal: Strength & Muscle Tone: within normal limits Gait & Station: normal Patient leans: N/A  Psychiatric Specialty Exam: Physical Exam Vitals and nursing note reviewed.  Constitutional:      Appearance: Normal appearance.  HENT:     Head: Normocephalic and atraumatic.  Pulmonary:     Effort: Pulmonary effort is normal.  Neurological:     General: No focal deficit present.     Mental Status: She is alert and oriented to person, place, and time.     Review of Systems  Blood pressure 125/86, pulse (!) 111, temperature 98.1 F (36.7 C), temperature source Oral, SpO2 97 %, unknown if currently breastfeeding.There is no height or weight on file to calculate BMI.  General Appearance: Disheveled  Eye Contact:  Fair  Speech:  Pressured  Volume:  Normal  Mood:  Dysphoric and Irritable  Affect:  Labile  Thought Process:  Coherent and Descriptions of Associations: Intact  Orientation:  Full (Time, Place, and Person)  Thought Content:  Delusions and Rumination  Suicidal Thoughts:  No  Homicidal Thoughts:  No  Memory:  Immediate;   Fair Recent;   Fair Remote;   Fair  Judgement:  Intact  Insight:  Lacking  Psychomotor Activity:  Increased  Concentration:  Concentration: Fair and Attention Span: Fair  Recall:  [employer] of Knowledge:  Fair  Language:  Good   Akathisia:  Negative  Handed:  Right  AIMS (if indicated):     Assets:  Desire for Improvement Resilience  ADL's:  Intact  Cognition:  WNL  Sleep:  Number of Hours: 5.5     Treatment Plan Summary: Daily contact with patient to assess and evaluate symptoms and progress in treatment, Medication management and Plan : Patient seen and examined.  Patient is a 39 year old female with the above-stated past psychiatric history who is seen in follow-up.  Diagnosis: 1.  Schizoaffective disorder; bipolar type versus schizophrenia versus bipolar disorder; manic with psychotic features 2.  Recent vaginal delivery of normal child. 3.  Essential hypertension 4.  Mild anemia 5.  Hirsutism 6.  Polyuria  Pertinent findings on examination today: 1.  Still paranoid and intrusive, but far better than previously.  Anger and hostility has decreased.  Sleep has slightly improved.  Plan: 1.  Change Zyprexa Zydis to 5 mg p.o. daily 15 mg p.o. nightly for psychosis, mood stability and sleep. 2.  Change Depakote ER 500 mg 2 p.o. nightly for mood stability. 3.  Continue hydroxyzine 50 mg p.o. 3 times daily as needed anxiety. 4.  Continue Benadryl/lorazepam/Zyprexa as needed for agitation. 5.  Add Aldactone 12.5 mg p.o. daily for hirsutism. 6.  Repeat urinalysis secondary to polyuria. 7.  Disposition planning-in progress.   Sharma Covert, MD 07/15/2019, 12:21 PM

## 2019-07-15 NOTE — Progress Notes (Signed)
   07/15/19 2230  COVID-19 Daily Checkoff  Have you had a fever (temp > 37.80C/100F)  in the past 24 hours?  No  If you have had runny nose, nasal congestion, sneezing in the past 24 hours, has it worsened? No  COVID-19 EXPOSURE  Have you traveled outside the state in the past 14 days? No  Have you been in contact with someone with a confirmed diagnosis of COVID-19 or PUI in the past 14 days without wearing appropriate PPE? No  Have you been living in the same home as a person with confirmed diagnosis of COVID-19 or a PUI (household contact)? No  Have you been diagnosed with COVID-19? No

## 2019-07-15 NOTE — Progress Notes (Addendum)
SPIRITUALITY GROUP NOTE  Pt attended spirituality group facilitated by Simone Curia, MDIv, [place].  Group Description: Group focused on topic of hope. Patients participated in facilitated discussion around topic, connecting with one another around experiences and definitions for hope. Group members engaged with visual explorer photos, reflecting on what hope looks like for them today. Group engaged in discussion around how their definitions of hope are present today in hospital.  Modalities: Psycho-social ed, Adlerian, Narrative, MI  Patient Progress: [name] [name] was present throughout group with exception of being pulled from group to speak with staff.  [name] [name] presented with calm affect, loosely oriented to topic - focusing on upcoming court date and relating that her children are being taken away from her. She states that she had / has a relationship, but described this person as unfaithful.   She states she is hopeful to have some support around this upcoming court date and is worried that she will be penalized that she is in [facility].  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  She notes that she is praying.

## 2019-07-15 NOTE — Tx Team (Addendum)
Interdisciplinary Treatment and Diagnostic Plan Update  07/15/2019 Time of Session: [place] MRN: [id number]  Principal Diagnosis: <principal problem not specified>  Secondary Diagnoses: Active Problems:   Psychosis ([place])   Current Medications:  Current Facility-Administered Medications  Medication Dose Route Frequency Provider Last Rate Last Admin  . acetaminophen (TYLENOL) tablet 650 mg  650 mg Oral Q6H PRN Sharma Covert, MD      . alum & mag hydroxide-simeth (MAALOX/MYLANTA) 200-200-20 MG/5ML suspension 30 mL  30 mL Oral Q4H PRN Sharma Covert, MD      . diphenhydrAMINE (BENADRYL) capsule 50 mg  50 mg Oral Q4H PRN Sharma Covert, MD   50 mg at 07/14/19 4196   Or  . diphenhydrAMINE (BENADRYL) injection 50 mg  50 mg Intramuscular QID PRN Sharma Covert, MD      . divalproex (DEPAKOTE ER) 24 hr tablet 500 mg  500 mg Oral Daily Sharma Covert, MD   500 mg at 07/15/19 0735  . docusate sodium (COLACE) capsule 100 mg  100 mg Oral Daily PRN Sharma Covert, MD      . feeding supplement (ENSURE ENLIVE) (ENSURE ENLIVE) liquid 237 mL  237 mL Oral BID BM Sharma Covert, MD      . hydrocerin (EUCERIN) cream   Topical BID Sharma Covert, MD      . hydrOXYzine (ATARAX/VISTARIL) tablet 50 mg  50 mg Oral TID PRN Sharma Covert, MD      . LORazepam (ATIVAN) tablet 2 mg  2 mg Oral Q4H PRN Sharma Covert, MD   2 mg at 07/14/19 2229   Or  . LORazepam (ATIVAN) injection 2 mg  2 mg Intramuscular Q4H PRN Sharma Covert, MD      . OLANZapine zydis (ZYPREXA) disintegrating tablet 10 mg  10 mg Oral Q8H PRN Sharma Covert, MD       And  . LORazepam (ATIVAN) tablet 1 mg  1 mg Oral PRN Sharma Covert, MD       And  . ziprasidone (GEODON) injection 20 mg  20 mg Intramuscular PRN Sharma Covert, MD      . magnesium hydroxide (MILK OF MAGNESIA) suspension 30 mL  30 mL Oral Daily PRN Sharma Covert, MD      . metoprolol succinate  (TOPROL-XL) 24 hr tablet 25 mg  25 mg Oral Daily Sharma Covert, MD   25 mg at 07/15/19 0735  . OLANZapine zydis (ZYPREXA) disintegrating tablet 15 mg  15 mg Oral QHS Sharma Covert, MD      . OLANZapine zydis [facility]) disintegrating tablet 5 mg  5 mg Oral Daily Sharma Covert, MD   5 mg at 07/15/19 0736  . prenatal multivitamin tablet 1 tablet  1 tablet Oral Q1200 Sharma Covert, MD   1 tablet at 07/15/19 1257  . spironolactone (ALDACTONE) tablet 12.5 mg  12.5 mg Oral Daily Sharma Covert, MD       PTA Medications: Medications Prior to Admission  Medication Sig Dispense Refill Last Dose  . melatonin 3 MG TABS tablet Take 2 tablets (6 mg total) by mouth at bedtime. 30 tablet 0   . OLANZapine (ZYPREXA) 10 MG tablet Take 1 tablet (10 mg total) by mouth in the morning. 30 tablet 0   . OLANZapine (ZYPREXA) 15 MG tablet Take 1 tablet (15 mg total) by mouth at bedtime. 30 tablet 0   . Prenatal Vit-Fe Fumarate-FA (PRENATAL MULTIVITAMIN)  TABS tablet Take 1 tablet by mouth daily at 12 noon. 30 tablet 0     Patient Stressors:    Patient Strengths:    Treatment Modalities: Medication Management, Group therapy, Case management,  1 to 1 session with clinician, Psychoeducation, Recreational therapy.   Physician Treatment Plan for Primary Diagnosis: <principal problem not specified> Long Term Goal(s): Improvement in symptoms so as ready for discharge Improvement in symptoms so as ready for discharge   Short Term Goals: Ability to identify changes in lifestyle to reduce recurrence of condition will improve Ability to verbalize feelings will improve Ability to demonstrate self-control will improve Ability to identify and develop effective coping behaviors will improve Ability to maintain clinical measurements within normal limits will improve Compliance with prescribed medications will improve Ability to identify changes in lifestyle to reduce recurrence of condition will  improve Ability to verbalize feelings will improve Ability to demonstrate self-control will improve Ability to identify and develop effective coping behaviors will improve Ability to maintain clinical measurements within normal limits will improve Compliance with prescribed medications will improve  Medication Management: Evaluate patient's response, side effects, and tolerance of medication regimen.  Therapeutic Interventions: 1 to 1 sessions, Unit Group sessions and Medication administration.  Evaluation of Outcomes: Not Met  Physician Treatment Plan for Secondary Diagnosis: Active Problems:   Psychosis ([place])  Long Term Goal(s): Improvement in symptoms so as ready for discharge Improvement in symptoms so as ready for discharge   Short Term Goals: Ability to identify changes in lifestyle to reduce recurrence of condition will improve Ability to verbalize feelings will improve Ability to demonstrate self-control will improve Ability to identify and develop effective coping behaviors will improve Ability to maintain clinical measurements within normal limits will improve Compliance with prescribed medications will improve Ability to identify changes in lifestyle to reduce recurrence of condition will improve Ability to verbalize feelings will improve Ability to demonstrate self-control will improve Ability to identify and develop effective coping behaviors will improve Ability to maintain clinical measurements within normal limits will improve Compliance with prescribed medications will improve     Medication Management: Evaluate patient's response, side effects, and tolerance of medication regimen.  Therapeutic Interventions: 1 to 1 sessions, Unit Group sessions and Medication administration.  Evaluation of Outcomes: Not Met   RN Treatment Plan for Primary Diagnosis: <principal problem not specified> Long Term Goal(s): Knowledge of disease and therapeutic regimen to maintain  health will improve  Short Term Goals: Ability to verbalize frustration and anger appropriately will improve, Ability to demonstrate self-control, Ability to participate in decision making will improve, Ability to verbalize feelings will improve, Ability to identify and develop effective coping behaviors will improve and Compliance with prescribed medications will improve  Medication Management: RN will administer medications as ordered by provider, will assess and evaluate patient's response and provide education to patient for prescribed medication. RN will report any adverse and/or side effects to prescribing provider.  Therapeutic Interventions: 1 on 1 counseling sessions, Psychoeducation, Medication administration, Evaluate responses to treatment, Monitor vital signs and CBGs as ordered, Perform/monitor CIWA, COWS, AIMS and Fall Risk screenings as ordered, Perform wound care treatments as ordered.  Evaluation of Outcomes: Not Met   LCSW Treatment Plan for Primary Diagnosis: <principal problem not specified> Long Term Goal(s): Safe transition to appropriate next level of care at discharge, Engage patient in therapeutic group addressing interpersonal concerns.  Short Term Goals: Engage patient in aftercare planning with referrals and resources, Increase social support, Increase ability to appropriately  verbalize feelings, Increase emotional regulation, Facilitate acceptance of mental health diagnosis and concerns, Facilitate patient progression through stages of change regarding substance use diagnoses and concerns, Identify triggers associated with mental health/substance abuse issues and Increase skills for wellness and recovery  Therapeutic Interventions: Assess for all discharge needs, 1 to 1 time with Social worker, Explore available resources and support systems, Assess for adequacy in community support network, Educate family and significant other(s) on suicide prevention, Complete  Psychosocial Assessment, Interpersonal group therapy.  Evaluation of Outcomes: Not Met   Progress in Treatment: Attending groups: Yes. Participating in groups: Yes. Taking medication as prescribed: Yes. Toleration medication: Yes. Family/Significant other contact made:No Will contact Lenord Fellers Patient understands diagnosis: Yes. Discussing patient identified problems/goals with staff: Yes. Medical problems stabilized or resolved: Yes. Denies suicidal/homicidal ideation: Yes. Issues/concerns per patient self-inventory: No. Other:    New problem(s) identified: No, Describe:  no new problems  New Short Term/Long Term Goal(s):  Patient Goals: "To Go home"    Discharge Plan or Barriers:   Reason for Continuation of Hospitalization: Medication stabilization  Estimated Length of Stay:  Attendees: Patient: [name] [name] 07/15/2019 2:11 PM  Physician: Mallie Darting  07/15/2019 2:11 PM  Nursing:  07/15/2019 2:11 PM  RN Care Manager: 07/15/2019 2:11 PM  Social Worker: Macon Large 07/15/2019 2:11 PM  Recreational Therapist:  07/15/2019 2:11 PM  Other:  07/15/2019 2:11 PM  Other:  07/15/2019 2:11 PM  Other: 07/15/2019 2:11 PM    Scribe for Treatment Team: [name], LCSW 07/15/2019 2:11 PM

## 2019-07-15 NOTE — Progress Notes (Signed)
DAR NOTE: Pt present with flat affect and irritable mood in the unit. Pt's verbally abusive towards staff, demanding for things to be done her way. Medication given as ordered, safety ensured with q 15 minute and environmental checks. Pt currently denies SI/HI and A/V hallucinations. Pt verbally agrees to seek staff if SI/HI or A/VH occurs and to consult with staff before acting on these thoughts. Will continue POC.

## 2019-07-16 MED ORDER — OLANZAPINE 5 MG PO TBDP
2.5000 mg | ORAL_TABLET | Freq: Every day | ORAL | Status: DC
  Filled 2019-07-16 (×3): qty 0.5

## 2019-07-16 MED ORDER — OLANZAPINE 5 MG PO TBDP: 5.0000 mg | ORAL_TABLET | Freq: Three times a day (TID) | ORAL | Status: DC | PRN

## 2019-07-16 MED ORDER — LORAZEPAM 1 MG PO TABS: 1.0000 mg | ORAL_TABLET | Freq: Four times a day (QID) | ORAL | Status: DC | PRN

## 2019-07-16 MED ORDER — LORAZEPAM 0.5 MG PO TABS: 0.5000 mg | ORAL_TABLET | ORAL | Status: DC | PRN

## 2019-07-16 MED ORDER — OLANZAPINE 10 MG PO TBDP
10.0000 mg | ORAL_TABLET | Freq: Every day | ORAL | Status: DC
  Filled 2019-07-16: qty 1

## 2019-07-16 MED ORDER — OLANZAPINE 5 MG PO TBDP
7.5000 mg | ORAL_TABLET | Freq: Every day | ORAL | Status: DC
  Administered 2019-07-18: 7.5 mg via ORAL
  Filled 2019-07-16 (×4): qty 1.5

## 2019-07-16 MED ORDER — LORAZEPAM 0.5 MG PO TABS: 0.5000 mg | ORAL_TABLET | Freq: Four times a day (QID) | ORAL | Status: DC | PRN

## 2019-07-16 MED ORDER — LORAZEPAM 2 MG/ML IJ SOLN: 1.0000 mg | Freq: Four times a day (QID) | INTRAMUSCULAR | Status: DC | PRN

## 2019-07-16 MED ORDER — LORAZEPAM 1 MG PO TABS: 2.0000 mg | ORAL_TABLET | Freq: Four times a day (QID) | ORAL | Status: DC | PRN

## 2019-07-16 NOTE — Progress Notes (Signed)
Pt presents with paranoia and irritability today.  She also is lethargic and says that the medicine she receives "is poison because it makes me too tired."  Pt continues to have disorganized thinking.  RN validated pt's feelings and used redirection and encouragement as much as possible.  At times pt is respectful and calm in her response to RN's encouragement/redirection, and then there are other times when pt becomes volatile and upset. Q 15 min safety checks remain in place.  RN will monitor and intervene as indicated.

## 2019-07-16 NOTE — Progress Notes (Signed)
[facility] MD Progress Note  07/16/2019 2:45 PM [name] [name]  MRN:  [id number] Subjective:  "  You all are poisoning me with too many medications" AA female seen today by this provider and Dr Parke Poisson.  Patient was admitted few days ago after she delivered a baby.  She has long hx of mental illness-Schizoaffective d/o or Bipolar disorder. It appears she has not been taking her medications during pregnancy.  After delivering baby, per previous notes, baby was taken away from her due to psychiatric symptoms-Paranoia and agitation.  In [facility] unit she has remained agitated and angry and difficult to redirect.  She received  Her last PRN medication for agitation last night at 11 pm.  She also received Agitation protocol combination of medications on Thurdsay.  Today she was drowsy and unable to participate in assessment this morning.  By non time she was able to wake up enough and had a shower.  When she was met again in the dayroom sleeping she wa able to have conversation with the two providers.  She believes that she is being poisoned with "heavy dose" medications.  She wanted to be discharged today  And she admitted that in the past she did not take her medications.  She wanted to be discharged home today with prescriptions for lower doses of her medicines and she plans to go back to her apartment.  Mentioning her new born baby girl made her more angry.  She accepted her am medications without difficulties. Providers promised her that we will  Review her medications and make changes.  She also was promised that her behavior in the unit and compliance with her medications will determine her discharge on Monday or Tuesday.  She denied SI/HI/AVH. Principal Problem: Psychosis ([name]) Diagnosis: Principal Problem:   Psychosis ([place])  Total Time spent with patient: 30 minutes  Past Psychiatric History: Denies, unknown by patient  Past Medical History:  Past Medical History:  Diagnosis Date  . Bipolar affective  disorder, currently manic, mild ([place])   . Diabetes mellitus without complication ([place])   . Schizophrenia [facility])     Past Surgical History:  Procedure Laterality Date  . CESAREAN SECTION  04/2018  . WISDOM TOOTH EXTRACTION     Family History:  Family History  Problem Relation Age of Onset  . Drug abuse Maternal Uncle    Family Psychiatric  History: Denies unknown Social History:  Social History   Substance and Sexual Activity  Alcohol Use No     Social History   Substance and Sexual Activity  Drug Use No    Social History   Socioeconomic History  . Marital status: Legally Separated    Spouse name: Not on file  . Number of children: Not on file  . Years of education: Not on file  . Highest education level: Not on file  Occupational History  . Not on file  Tobacco Use  . Smoking status: Never Smoker  . Smokeless tobacco: Never Used  Vaping Use  . Vaping Use: Never used  Substance and Sexual Activity  . Alcohol use: No  . Drug use: No  . Sexual activity: Not Currently  Other Topics Concern  . Not on file  Social History Narrative  . Not on file   Social Determinants of Health   Financial Resource Strain:   . Difficulty of Paying Living Expenses:   Food Insecurity:   . Worried About Charity fundraiser in the Last Year:   . [employer]  of Food in the Last Year:   Transportation Needs:   . Film/video editor (Medical):   Marland Kitchen Lack of Transportation (Non-Medical):   Physical Activity:   . Days of Exercise per Week:   . Minutes of Exercise per Session:   Stress:   . Feeling of Stress :   Social Connections:   . Frequency of Communication with Friends and Family:   . Frequency of Social Gatherings with Friends and Family:   . Attends Religious Services:   . Active Member of Clubs or Organizations:   . Attends Archivist Meetings:   Marland Kitchen Marital Status:    Additional Social History:                         Sleep: Good  Appetite:   Fair  Current Medications: Current Facility-Administered Medications  Medication Dose Route Frequency Provider Last Rate Last Admin  . acetaminophen (TYLENOL) tablet 650 mg  650 mg Oral Q6H PRN Sharma Covert, MD      . alum & mag hydroxide-simeth (MAALOX/MYLANTA) 200-200-20 MG/5ML suspension 30 mL  30 mL Oral Q4H PRN Sharma Covert, MD      . diphenhydrAMINE (BENADRYL) capsule 50 mg  50 mg Oral Q4H PRN Sharma Covert, MD   50 mg at 07/14/19 3845   Or  . diphenhydrAMINE (BENADRYL) injection 50 mg  50 mg Intramuscular QID PRN Sharma Covert, MD      . divalproex (DEPAKOTE ER) 24 hr tablet 500 mg  500 mg Oral Daily Sharma Covert, MD   500 mg at 07/16/19 1005  . docusate sodium (COLACE) capsule 100 mg  100 mg Oral Daily PRN Sharma Covert, MD      . feeding supplement (ENSURE ENLIVE) (ENSURE ENLIVE) liquid 237 mL  237 mL Oral BID BM Sharma Covert, MD   237 mL at 07/16/19 1348  . hydrocerin (EUCERIN) cream   Topical BID Sharma Covert, MD   1 application at 36/46/80 1722  . hydrOXYzine (ATARAX/VISTARIL) tablet 50 mg  50 mg Oral TID PRN Sharma Covert, MD      . LORazepam (ATIVAN) tablet 1 mg  1 mg Oral Q6H PRN Delfin Gant, NP       Or  . LORazepam (ATIVAN) injection 1 mg  1 mg Intramuscular Q6H PRN Kaedyn Belardo C, NP      . LORazepam (ATIVAN) tablet 0.5 mg  0.5 mg Oral Q6H PRN Delfin Gant, NP       And  . OLANZapine zydis (ZYPREXA) disintegrating tablet 5 mg  5 mg Oral Q8H PRN Jourden Delmont C, NP      . magnesium hydroxide (MILK OF MAGNESIA) suspension 30 mL  30 mL Oral Daily PRN Sharma Covert, MD      . metoprolol succinate (TOPROL-XL) 24 hr tablet 25 mg  25 mg Oral Daily Sharma Covert, MD   25 mg at 07/16/19 1003  . [START ON 07/17/2019] OLANZapine zydis (ZYPREXA) disintegrating tablet 2.5 mg  2.5 mg Oral Daily Cadden Elizondo C, NP      . OLANZapine zydis (ZYPREXA) disintegrating tablet 7.5 mg  7.5 mg Oral QHS Kamsiyochukwu Buist,  Earsie Humm C, NP      . prenatal multivitamin tablet 1 tablet  1 tablet Oral Q1200 Sharma Covert, MD   1 tablet at 07/16/19 1348  . spironolactone (ALDACTONE) tablet 12.5 mg  12.5 mg Oral Daily Sharma Covert,  MD   12.5 mg at 07/16/19 1003    Lab Results:  Results for orders placed or performed during the hospital encounter of 07/13/19 (from the past 48 hour(s))  Urinalysis, Complete w Microscopic     Status: Abnormal   Collection Time: 07/15/19 12:21 PM  Result Value Ref Range   Color, Urine YELLOW YELLOW   APPearance CLEAR CLEAR   Specific Gravity, Urine 1.016 1.005 - 1.030   pH 7.0 5.0 - 8.0   Glucose, UA NEGATIVE NEGATIVE mg/dL   Hgb urine dipstick NEGATIVE NEGATIVE   Bilirubin Urine NEGATIVE NEGATIVE   Ketones, ur 5 (A) NEGATIVE mg/dL   Protein, ur NEGATIVE NEGATIVE mg/dL   Nitrite NEGATIVE NEGATIVE   Leukocytes,Ua NEGATIVE NEGATIVE   RBC / HPF 0-5 0 - 5 RBC/hpf   WBC, UA 0-5 0 - 5 WBC/hpf   Bacteria, UA NONE SEEN NONE SEEN   Squamous Epithelial / LPF 0-5 0 - 5   Mucus PRESENT     Comment: Performed at [facility], [place] [address] [place], [place] [address]    Blood Alcohol level:  Lab Results  Component Value Date   ETH <10 11/19/2017   ETH <10 62/37/6283    Metabolic Disorder Labs: Lab Results  Component Value Date   HGBA1C 5.7 (H) 07/04/2019   MPG 116.89 07/04/2019   MPG 97 10/07/2017   Lab Results  Component Value Date   PROLACTIN 46.5 (H) 07/25/2015   PROLACTIN 21.6 02/01/2015   Lab Results  Component Value Date   CHOL 182 10/07/2017   TRIG 46 10/07/2017   HDL 65 10/07/2017   CHOLHDL 2.8 10/07/2017   VLDL 9 10/07/2017   LDLCALC 108 (H) 10/07/2017   LDLCALC 73 07/25/2015    Physical Findings: AIMS:  , ,  ,  ,    CIWA:    COWS:     Musculoskeletal: Strength & Muscle Tone: within normal limits Gait & Station: normal Patient leans: N/A  Psychiatric Specialty Exam: Physical Exam  Review of Systems  Unable to  perform ROS: Psychiatric disorder  All other systems reviewed and are negative. Drowsy, not answering questions and  Delusional-Poisoned with too much medications.  Blood pressure 119/83, pulse (!) 101, temperature 99 F (37.2 C), temperature source Oral, resp. rate 18, SpO2 97 %, unknown if currently breastfeeding.There is no height or weight on file to calculate BMI.  General Appearance: Casual and Fairly Groomed  Eye Contact:  Poor  Speech:  Clear and Coherent and Pressured  Volume:  Increased  Mood:  Angry and Irritable  Affect:  Congruent and Labile  Thought Process:  Coherent  Orientation:  Full (Time, Place, and Person)  Thought Content:  Illogical  Suicidal Thoughts:  No  Homicidal Thoughts:  No  Memory:  intact  Judgement:  Poor  Insight:  Fair  Psychomotor Activity:  Wide Base  Concentration:  Concentration: Fair and Attention Span: Fair  Recall:  [employer] of Knowledge:  Fair  Language:  Good  Akathisia:  No  Handed:  Right  AIMS (if indicated):     Assets:  Communication Skills Desire for Improvement Housing Physical Health Social Support  ADL's:  Intact  Cognition:  WNL  Sleep:  Number of Hours: 5.5     Treatment Plan Summary: Daily contact with patient to assess and evaluate symptoms and progress in treatment, Medication management and Plan Decrease Zyprexa from 10 mg po qhs to 7.5 mg.  Decrease day time doise Zyprexa to  2.5 mg daily.  Discontinue Geodone, decrease both scheduled and PRN Ativan.  Goal is to avoid Hypersomnia, manage mood and Psychosis.  Plan is to discharge with referral to outpatient an outpatient [place], NP 07/16/2019, 2:45 PM

## 2019-07-16 NOTE — [facility] Group Notes (Signed)
Adult Psychoeducational Group Note  Date:  07/16/2019 Time:  11:55 AM  Group Topic/Focus:  Goals Group:   The focus of this group is to help patients establish daily goals to achieve during treatment and discuss how the patient can incorporate goal setting into their daily lives to aide in recovery.  Participation Level:  None  Participation Quality:  Drowsy  Affect:  Flat and Lethargic  Cognitive:  Lacking and unable to assess  Insight: None  Engagement in Group:  Lacking  Modes of Intervention:  Discussion, Education and Socialization  Additional Comments:  Pt came in the group room in the middle of the group and went straight to sleep  Bryson Dames A 07/16/2019, 11:55 AM

## 2019-07-17 MED ORDER — LORAZEPAM 2 MG/ML IJ SOLN: 0.5000 mg | Freq: Four times a day (QID) | INTRAMUSCULAR | Status: DC | PRN

## 2019-07-17 MED ORDER — LORAZEPAM 0.5 MG PO TABS: 0.5000 mg | ORAL_TABLET | Freq: Four times a day (QID) | ORAL | Status: DC | PRN

## 2019-07-17 MED ORDER — DIVALPROEX SODIUM ER 250 MG PO TB24
750.0000 mg | ORAL_TABLET | Freq: Every day | ORAL | Status: DC
  Filled 2019-07-17 (×3): qty 3

## 2019-07-17 NOTE — Progress Notes (Addendum)
[facility] MD Progress Note  07/17/2019 2:21 PM [name] [name]  MRN:  [id number] Subjective:  " I feel better to dayand not sleeping as much" Patient was met in her room this am by providers.  She is alert and oriented x 4.  She engaged in meaningful conversation as she is not drowsy today.  She is missing her baby girl and believes she was taken from her out of lies purported by people around her who believes she is homeless.  Patient has asked to continue to decrease her medications as they all make her hypersomnia.  She has not required PRN medications in the last 36 hours although Nursing reports she voided into her trash can last night and would not allow staff help her clean up.  She states her bahavior last night was due to medications.  We will continue to evaluate medications and continue to taper down.  She denies SI/HI/AVH.  Principal Problem: Psychosis ([place]) Diagnosis: Principal Problem:   Psychosis ([place])  Total Time spent with patient: 20 minutes  Past Psychiatric History: Denies, unknown by patient  Past Medical History:  Past Medical History:  Diagnosis Date  . Bipolar affective disorder, currently manic, mild ([place])   . Diabetes mellitus without complication ([place])   . Schizophrenia [facility])     Past Surgical History:  Procedure Laterality Date  . CESAREAN SECTION  04/2018  . WISDOM TOOTH EXTRACTION     Family History:  Family History  Problem Relation Age of Onset  . Drug abuse Maternal Uncle    Family Psychiatric  History: Denies unknown Social History:  Social History   Substance and Sexual Activity  Alcohol Use No     Social History   Substance and Sexual Activity  Drug Use No    Social History   Socioeconomic History  . Marital status: Legally Separated    Spouse name: Not on file  . Number of children: Not on file  . Years of education: Not on file  . Highest education level: Not on file  Occupational History  . Not on file  Tobacco Use  . Smoking  status: Never Smoker  . Smokeless tobacco: Never Used  Vaping Use  . Vaping Use: Never used  Substance and Sexual Activity  . Alcohol use: No  . Drug use: No  . Sexual activity: Not Currently  Other Topics Concern  . Not on file  Social History Narrative  . Not on file   Social Determinants of Health   Financial Resource Strain:   . Difficulty of Paying Living Expenses:   Food Insecurity:   . Worried About Charity fundraiser in the Last Year:   . Arboriculturist in the Last Year:   Transportation Needs:   . Film/video editor (Medical):   Marland Kitchen Lack of Transportation (Non-Medical):   Physical Activity:   . Days of Exercise per Week:   . Minutes of Exercise per Session:   Stress:   . Feeling of Stress :   Social Connections:   . Frequency of Communication with Friends and Family:   . Frequency of Social Gatherings with Friends and Family:   . Attends Religious Services:   . Active Member of Clubs or Organizations:   . Attends Archivist Meetings:   Marland Kitchen Marital Status:    Additional Social History:                         Sleep:  Good  Appetite:  Fair  Current Medications: Current Facility-Administered Medications  Medication Dose Route Frequency Provider Last Rate Last Admin  . acetaminophen (TYLENOL) tablet 650 mg  650 mg Oral Q6H PRN Sharma Covert, MD      . alum & mag hydroxide-simeth (MAALOX/MYLANTA) 200-200-20 MG/5ML suspension 30 mL  30 mL Oral Q4H PRN Sharma Covert, MD      . diphenhydrAMINE (BENADRYL) capsule 50 mg  50 mg Oral Q4H PRN Sharma Covert, MD   50 mg at 07/14/19 2091   Or  . diphenhydrAMINE (BENADRYL) injection 50 mg  50 mg Intramuscular QID PRN Sharma Covert, MD      . Derrill Memo ON 07/18/2019] divalproex (DEPAKOTE ER) 24 hr tablet 750 mg  750 mg Oral Daily Ossiel Marchio C, NP      . docusate sodium (COLACE) capsule 100 mg  100 mg Oral Daily PRN Sharma Covert, MD      . feeding supplement (ENSURE ENLIVE)  (ENSURE ENLIVE) liquid 237 mL  237 mL Oral BID BM Sharma Covert, MD   237 mL at 07/16/19 1348  . hydrocerin (EUCERIN) cream   Topical BID Sharma Covert, MD   Given at 07/16/19 1702  . hydrOXYzine (ATARAX/VISTARIL) tablet 50 mg  50 mg Oral TID PRN Sharma Covert, MD      . LORazepam (ATIVAN) tablet 0.5 mg  0.5 mg Oral Q6H PRN Delfin Gant, NP       Or  . LORazepam (ATIVAN) injection 0.5 mg  0.5 mg Intramuscular Q6H PRN Marice Angelino C, NP      . LORazepam (ATIVAN) tablet 0.5 mg  0.5 mg Oral Q6H PRN Delfin Gant, NP       And  . OLANZapine zydis (ZYPREXA) disintegrating tablet 5 mg  5 mg Oral Q8H PRN Tanishia Lemaster C, NP      . magnesium hydroxide (MILK OF MAGNESIA) suspension 30 mL  30 mL Oral Daily PRN Sharma Covert, MD      . metoprolol succinate (TOPROL-XL) 24 hr tablet 25 mg  25 mg Oral Daily Sharma Covert, MD   25 mg at 07/16/19 1003  . OLANZapine zydis (ZYPREXA) disintegrating tablet 7.5 mg  7.5 mg Oral QHS Keshana Klemz C, NP      . prenatal multivitamin tablet 1 tablet  1 tablet Oral Q1200 Sharma Covert, MD   1 tablet at 07/16/19 1348  . spironolactone (ALDACTONE) tablet 12.5 mg  12.5 mg Oral Daily Sharma Covert, MD   12.5 mg at 07/16/19 1003    Lab Results:  No results found for this or any previous visit (from the past 48 hour(s)).  Blood Alcohol level:  Lab Results  Component Value Date   ETH <10 11/19/2017   ETH <10 98/02/2177    Metabolic Disorder Labs: Lab Results  Component Value Date   HGBA1C 5.7 (H) 07/04/2019   MPG 116.89 07/04/2019   MPG 97 10/07/2017   Lab Results  Component Value Date   PROLACTIN 46.5 (H) 07/25/2015   PROLACTIN 21.6 02/01/2015   Lab Results  Component Value Date   CHOL 182 10/07/2017   TRIG 46 10/07/2017   HDL 65 10/07/2017   CHOLHDL 2.8 10/07/2017   VLDL 9 10/07/2017   LDLCALC 108 (H) 10/07/2017   LDLCALC 73 07/25/2015    Physical Findings: AIMS:  , ,  ,  ,    CIWA:     COWS:  Musculoskeletal: Strength & Muscle Tone: within normal limits Gait & Station: normal Patient leans: N/A  Psychiatric Specialty Exam: Physical Exam Constitutional:      Appearance: Normal appearance.  Cardiovascular:     Pulses: Normal pulses.  Pulmonary:     Effort: Pulmonary effort is normal.  Neurological:     Mental Status: She is alert. Mental status is at baseline.     ROS-Negative  Blood pressure 119/83, pulse (!) 101, temperature 99 F (37.2 C), temperature source Oral, resp. rate 18, SpO2 97 %, unknown if currently breastfeeding.There is no height or weight on file to calculate BMI.  General Appearance: Casual and Fairly Groomed  Eye Contact:  Good  Speech:  Clear and Coherent  Volume:  Normal  Mood:  calm, denies depression  Affect:  Appropriate and Congruent  Thought Process:  Coherent  Orientation:  Full (Time, Place, and Person)  Thought Content:  Logical  Suicidal Thoughts:  No  Homicidal Thoughts:  No  Memory:  intact  Judgement:  Fair  Insight:  Good and much improved  Psychomotor Activity:  Normal  Concentration:  Concentration: Fair and Attention Span: Fair  Recall:  [employer] of Knowledge:  Fair  Language:  Good  Akathisia:  No  Handed:  Right  AIMS (if indicated):     Assets:  Communication Skills Desire for Improvement Housing Physical Health Social Support  ADL's:  Intact  Cognition:  WNL  Sleep:  Number of Hours: 5.5     Treatment Plan Summary: Daily contact with patient to assess and evaluate symptoms and progress in treatment, Medication management and Plan D/C daytime Zyprexa, continue Zyprexa 7.5 mg po at bed time.  Increase mood stabilizer Depakote to 750 mg po at bed time.  Obtain Depakote level in am.  Continue to monitor for mood lability  Goal is to avoid Hypersomnia, manage mood and Psychosis.  Plan is to discharge with referral to outpatient [place], NP 07/17/2019, 2:21 PMP
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
## 2019-07-17 NOTE — [facility] Group Notes (Signed)
[place] Group Notes: (Clinical Social Work)   07/17/2019      Type of Therapy:  Group Therapy   Participation Level:  Did Not Attend - was invited individually by Nurse/MHT and chose not to attend.   Blane Ohara, LCSW 07/17/2019  12:28 PM

## 2019-07-17 NOTE — Progress Notes (Signed)
Patient came to nursing station c/o itching and swollen feet. She received benadryl 50 mg and encouraged to keep her feet elevated. Writer informed her that she has been refusing medications in the am that she really should be taking such as her blood pressure and diuretic medications that will help with her swelling. She spoke about many subjects also such as pain when she urinates, medication to stop her menstrual cycle and some type of abrasion treatment. She was pleasant tonight during our conversation. She returned to her room to rest after receiving benadryl. Will continue to monitor effectiveness of med given. Safety maintained with 15 min checks.

## 2019-07-17 NOTE — Progress Notes (Signed)
[facility] NOVEL CORONAVIRUS (COVID-19) DAILY CHECK-OFF SYMPTOMS - answer yes or no to each - every day NO YES  Have you had a fever in the past 24 hours?  . Fever (Temp > 37.80C / 100F) X   Have you had any of these symptoms in the past 24 hours? . New Cough .  Sore Throat  .  Shortness of Breath .  Difficulty Breathing .  Unexplained Body Aches   X   Have you had any one of these symptoms in the past 24 hours not related to allergies?   . Runny Nose .  Nasal Congestion .  Sneezing   X   If you have had runny nose, nasal congestion, sneezing in the past 24 hours, has it worsened?  X   EXPOSURES - check yes or no X   Have you traveled outside the state in the past 14 days?  X   Have you been in contact with someone with a confirmed diagnosis of COVID-19 or PUI in the past 14 days without wearing appropriate PPE?  X   Have you been living in the same home as a person with confirmed diagnosis of COVID-19 or a PUI (household contact)?    X   Have you been diagnosed with COVID-19?    X              What to do next: Answered NO to all: Answered YES to anything:   Proceed with unit schedule Follow the [facility] Inpatient Flowsheet.   Juanluis Guastella K. Amela Handley MSN, RN, [facility] [phone] 

## 2019-07-17 NOTE — Progress Notes (Signed)
Patient has been isolative to her room tonight other than going to dayroom to receive her snack and to get towels at nursing station. At beginning of shift patient urinated in her trash can.When staff attempted to ask about the incident she became angry and would not allow staff to assist with getting items cleaned. She came to nursing station to get towels and requested a print out of her medicines. Writer had print out and informed her of scheduled medication for tonight. She would not acknowledge that she heard writer and kept her back turned to door and she was awake. Patient has not spoken with nor responded to staff since then. Selective mutism currently. Will continue to monitor patients behavior.

## 2019-07-17 NOTE — Progress Notes (Signed)
  Pt is black female  of 39 y.o. , DOB [birth year], MRN  [id number]  presents IVC with a bazaar behaviors,  Hx of diabetes and Bipolar.   Pt refused Rx medication twice this morning and requested another printout of her medication.   Denies SI, HI or AVH today.  Pt reports LBM yesterday, no physical problems and pain denied.   Vitals signs being monitored. Pt states most important goal is go home.  no No signs of distress observed, safety maintained with q15 minute checks.    Lolly Mustache. Bobby Rumpf MSN, [place], [facility] [phone]

## 2019-07-17 NOTE — Progress Notes (Signed)
   07/16/19 2200  COVID-19 Daily Checkoff  Have you had a fever (temp > 37.80C/100F)  in the past 24 hours?  No  If you have had runny nose, nasal congestion, sneezing in the past 24 hours, has it worsened? No  COVID-19 EXPOSURE  Have you traveled outside the state in the past 14 days? No  Have you been in contact with someone with a confirmed diagnosis of COVID-19 or PUI in the past 14 days without wearing appropriate PPE? No  Have you been living in the same home as a person with confirmed diagnosis of COVID-19 or a PUI (household contact)? No  Have you been diagnosed with COVID-19? No

## 2019-07-18 DIAGNOSIS — F259 Schizoaffective disorder, unspecified: Secondary | ICD-10-CM

## 2019-07-18 MED ORDER — ZIPRASIDONE MESYLATE 20 MG IM SOLR: 20.0000 mg | INTRAMUSCULAR | Status: DC | PRN

## 2019-07-18 MED ORDER — DIVALPROEX SODIUM ER 500 MG PO TB24: 750.0000 mg | ORAL_TABLET | Freq: Every day | ORAL | Status: DC

## 2019-07-18 MED ORDER — OLANZAPINE 5 MG PO TBDP: 5.0000 mg | ORAL_TABLET | Freq: Three times a day (TID) | ORAL | Status: DC | PRN

## 2019-07-18 MED ORDER — LORAZEPAM 1 MG PO TABS: 1.0000 mg | ORAL_TABLET | ORAL | Status: DC | PRN

## 2019-07-18 MED ORDER — DIVALPROEX SODIUM ER 250 MG PO TB24
250.0000 mg | ORAL_TABLET | ORAL | Status: DC
  Administered 2019-07-19: 250 mg via ORAL
  Filled 2019-07-18: qty 1
  Filled 2019-07-18: qty 21
  Filled 2019-07-18: qty 1

## 2019-07-18 MED ORDER — HYDROXYZINE HCL 25 MG PO TABS
25.0000 mg | ORAL_TABLET | Freq: Four times a day (QID) | ORAL | Status: DC | PRN
  Administered 2019-07-19: 25 mg via ORAL
  Filled 2019-07-18: qty 1

## 2019-07-18 MED ORDER — OLANZAPINE 2.5 MG PO TABS
2.5000 mg | ORAL_TABLET | ORAL | Status: DC
  Administered 2019-07-19: 2.5 mg via ORAL
  Filled 2019-07-18 (×3): qty 1

## 2019-07-18 MED ORDER — DIVALPROEX SODIUM ER 500 MG PO TB24
500.0000 mg | ORAL_TABLET | Freq: Every day | ORAL | Status: DC
  Filled 2019-07-18: qty 1

## 2019-07-18 NOTE — [facility] Group Notes (Signed)
Adult Psychoeducational Group Note  Date:  07/18/2019 Time:  9:24 PM  Group Topic/Focus:  Wrap-Up Group:   The focus of this group is to help patients review their daily goal of treatment and discuss progress on daily workbooks.  Participation Level:  Active  Participation Quality:  Attentive  Affect:  Appropriate  Cognitive:  Appropriate  Insight: Good  Engagement in Group:  Improving  Modes of Intervention:  Discussion  Additional Comments:   Dalene Carrow 07/18/2019, 9:24 PM

## 2019-07-18 NOTE — Progress Notes (Signed)
Pt visible in the dayroom some this evening    07/18/19 2000  Psych Admission Type (Psych Patients Only)  Admission Status Involuntary  Psychosocial Assessment  Patient Complaints Irritability  Eye Contact Brief  Facial Expression Flat  Affect Preoccupied  Speech Logical/coherent  Interaction Assertive  Motor Activity Slow  Appearance/Hygiene Improved  Behavior Characteristics Appropriate to situation  Mood Labile  Aggressive Behavior  Effect No apparent injury  Thought Horticulturist, commercial of ideas  Content Preoccupation  Delusions Somatic;Religious  Perception UTA;WDL  Hallucination None reported or observed  Judgment Impaired  Confusion Mild  Danger to Self  Current suicidal ideation? Denies  Danger to Others  Danger to Others None reported or observed
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
## 2019-07-18 NOTE — Progress Notes (Signed)
   07/18/19 1100  Psych Admission Type (Psych Patients Only)  Admission Status Involuntary  Psychosocial Assessment  Patient Complaints Irritability  Eye Contact Brief  Facial Expression Flat  Affect Preoccupied  Speech Logical/coherent  Interaction Assertive  Motor Activity Slow  Appearance/Hygiene Improved  Behavior Characteristics Unwilling to participate  Mood Labile;Preoccupied  Aggressive Behavior  Effect No apparent injury  Thought Horticulturist, commercial of ideas  Content Preoccupation  Delusions Somatic;Religious  Perception UTA;WDL  Hallucination None reported or observed  Judgment Impaired  Confusion Mild  Danger to Self  Current suicidal ideation? Denies  Danger to Others  Danger to Others None reported or observed  Dar Note: Patient presents with irritable affect and mood.  Refused all prescribed medication after several attempts and education.  MD made aware.  States nothing is wrong with her mentally.  Patient continues to be hyper religious, disorganized with delusional thoughts and behaviors.  Routine safety checks maintained every 15 minutes.  Patient is safe on and off the unit.

## 2019-07-19 MED ORDER — OLANZAPINE 7.5 MG PO TABS: 7.5000 mg | ORAL_TABLET | Freq: Every day | ORAL | 0 refills | Status: DC

## 2019-07-19 MED ORDER — DIVALPROEX SODIUM ER 500 MG PO TB24: 500.0000 mg | ORAL_TABLET | Freq: Every day | ORAL | 0 refills | Status: DC

## 2019-07-19 MED ORDER — HYDROCERIN EX CREA: 1.0000 "application " | TOPICAL_CREAM | Freq: Two times a day (BID) | CUTANEOUS | 0 refills | Status: DC

## 2019-07-19 MED ORDER — DIVALPROEX SODIUM ER 250 MG PO TB24: 250.0000 mg | ORAL_TABLET | ORAL | 0 refills | Status: DC

## 2019-07-19 MED ORDER — SPIRONOLACTONE 25 MG PO TABS: 12.5000 mg | ORAL_TABLET | Freq: Every day | ORAL | 0 refills | Status: DC

## 2019-07-19 MED ORDER — METOPROLOL SUCCINATE ER 25 MG PO TB24: 25.0000 mg | ORAL_TABLET | Freq: Every day | ORAL | 0 refills | Status: DC

## 2019-07-19 MED ORDER — OLANZAPINE 7.5 MG PO TABS
7.5000 mg | ORAL_TABLET | Freq: Every day | ORAL | Status: DC
  Filled 2019-07-19: qty 1

## 2019-07-19 MED ORDER — OLANZAPINE 2.5 MG PO TABS: 2.5000 mg | ORAL_TABLET | ORAL | 0 refills | Status: DC

## 2019-07-19 NOTE — Progress Notes (Signed)
Pt discharged to lobby. Pt was stable and appreciative at that time. All papers, samples and prescriptions were given and valuables returned. Verbal understanding expressed. Denies SI/HI and A/VH. Pt given opportunity to express concerns and ask questions.  

## 2019-07-19 NOTE — Progress Notes (Signed)
  [facility] Adult Case Management Discharge Plan :  Will you be returning to the same living situation after discharge:  No. Will return to hotel.  At discharge, do you have transportation home?: No. Safe Transport will be arranged.  Do you have the ability to pay for your medications: No. Samples will be arranged at discharge.  Release of information consent forms completed and in the chart;  Patient's signature needed at discharge.  Patient to Follow up at:  Follow-up [place] Call.   Why: A referral has been made on your behalf.  Please contact this provider with your EMAIL address in order to obtain medication management and therapy services. Contact information: [place] [address] [place] Follow up.   Why: Go to this clinic for follow-up labwork 07/21/19. Contact information: [address] [place] [address] [phone]              Next level of care provider has access to [place] and Suicide Prevention discussed: Yes,  with patient.      Has patient been referred to the Quitline?: Patient refused referral  Patient has been referred for addiction treatment: Pt. refused referral  Vassie Moselle, [place] 07/19/2019, 10:51 AM

## 2019-07-19 NOTE — BHH Suicide Risk Assessment (Signed)
[facility] Discharge Suicide Risk Assessment   Principal Problem: Psychosis [facility]) Discharge Diagnoses: Principal Problem:   Psychosis ([place])   Total Time spent with patient: 15 minutes  Musculoskeletal: Strength & Muscle Tone: within normal limits Gait & Station: normal Patient leans: N/A  Psychiatric Specialty Exam: Review of Systems  All other systems reviewed and are negative.   Blood pressure (!) 118/95, pulse 99, temperature 97.7 F (36.5 C), temperature source Oral, resp. rate 20, SpO2 99 %, unknown if currently breastfeeding.There is no height or weight on file to calculate BMI.  General Appearance: Casual  Eye Contact::  Fair  Speech:  Normal Rate409  Volume:  Normal  Mood:  Euthymic  Affect:  Congruent  Thought Process:  Coherent and Descriptions of Associations: Intact  Orientation:  Full (Time, Place, and Person)  Thought Content:  Logical  Suicidal Thoughts:  No  Homicidal Thoughts:  No  Memory:  Immediate;   Fair Recent;   Fair Remote;   Fair  Judgement:  Intact  Insight:  Fair  Psychomotor Activity:  Increased  Concentration:  Fair  Recall:  [employer] of Knowledge:Fair  Language: Good  Akathisia:  Negative  Handed:  Right  AIMS (if indicated):     Assets:  Desire for Improvement Housing Resilience Talents/Skills  Sleep:  Number of Hours: 2  Cognition: WNL  ADL's:  Intact   Mental Status Per Nursing Assessment::   On Admission:     Demographic Factors:  Divorced or widowed, Low socioeconomic status, Living alone and Unemployed  Loss Factors: Legal issues  Historical Factors: Impulsivity  Risk Reduction Factors:   Positive coping skills or problem solving skills  Continued Clinical Symptoms:  Bipolar Disorder:   Mixed State  Cognitive Features That Contribute To Risk:  None    Suicide Risk:  Minimal: No identifiable suicidal ideation.  Patients presenting with no risk factors but with morbid ruminations; may be classified as minimal risk  based on the severity of the depressive symptoms   [place] Call.   Why: A referral has been made on your behalf.  Please contact this provider with your EMAIL address in order to obtain medication management and therapy services. Contact information: [address] [place] [address] [phone]               Plan Of Care/Follow-up recommendations:  Activity:  ad lib  Sharma Covert, MD 07/19/2019, 10:02 AM

## 2019-07-19 NOTE — Progress Notes (Signed)
Pt up much of the morning. Pt stated Zyprexa keeps her up and makes her angry, pt stated she can only take Benadryl and Melatonin to help her sleep . Pt encouraged to discuss that with the doctor.

## 2019-07-19 NOTE — Discharge Summary (Signed)
Physician Discharge Summary Note  Patient:  [name] [name] is an 39 y.o., female MRN:  [id number] DOB:  11-23-1980 Patient phone:  [phone] (home)  Patient address:   [address] [place] [address],  Total Time spent with patient: 15 minutes  Date of Admission:  07/13/2019 Date of Discharge: 07/19/19  Reason for Admission:  psychosis  Principal Problem: Psychosis [facility]) Discharge Diagnoses: Principal Problem:   Psychosis [facility])   Past Psychiatric History: Patient was able to provide a limited history.  Review of the electronic medical record revealed multiple psychiatric hospitalizations.  She has had 2 hospitalizations at our facility in 2020 and 2018.  It looks like she has had multiple admissions at [facility], [facility], [facility].  As best as I can tell she has previously been treated with Geodon, Abilify.  She claims multiple drug allergies with limited explanations.  The chart from 2018 revealed several admissions as well in [place].  She also apparently had been at [facility] previously.  She stated during the conversation that she was from [place], is unclear whether or not she had any psychiatric admissions in any facilities there.  She denied any previous suicide attempts according to the records in the old chart.  Past Medical History:  Past Medical History:  Diagnosis Date  . Bipolar affective disorder, currently manic, mild ([place])   . Diabetes mellitus without complication ([place])   . Schizophrenia [facility])     Past Surgical History:  Procedure Laterality Date  . CESAREAN SECTION  04/2018  . WISDOM TOOTH EXTRACTION     Family History:  Family History  Problem Relation Age of Onset  . Drug abuse Maternal Uncle    Family Psychiatric  History: Patient was a poor historian and unable to acquire this information. Social History:  Social History   Substance and Sexual Activity  Alcohol Use No     Social History   Substance and  Sexual Activity  Drug Use No    Social History   Socioeconomic History  . Marital status: Legally Separated    Spouse name: Not on file  . Number of children: Not on file  . Years of education: Not on file  . Highest education level: Not on file  Occupational History  . Not on file  Tobacco Use  . Smoking status: Never Smoker  . Smokeless tobacco: Never Used  Vaping Use  . Vaping Use: Never used  Substance and Sexual Activity  . Alcohol use: No  . Drug use: No  . Sexual activity: Not Currently  Other Topics Concern  . Not on file  Social History Narrative  . Not on file   Social Determinants of Health   Financial Resource Strain:   . Difficulty of Paying Living Expenses:   Food Insecurity:   . Worried About Charity fundraiser in the Last Year:   . Arboriculturist in the Last Year:   Transportation Needs:   . Film/video editor (Medical):   Marland Kitchen Lack of Transportation (Non-Medical):   Physical Activity:   . Days of Exercise per Week:   . Minutes of Exercise per Session:   Stress:   . Feeling of Stress :   Social Connections:   . Frequency of Communication with Friends and Family:   . Frequency of Social Gatherings with Friends and Family:   . Attends Religious Services:   . Active Member of Clubs or Organizations:   . Attends Archivist  Meetings:   Marland Kitchen Marital Status:     Hospital Course:  From admission H&P: Patient is a 39 year old female with a suspected past psychiatric history significant for schizoaffective disorder versus bipolar disorder with psychotic features who has been with then be [place] care system since approximately 6/27. The patient had been admitted for involuntary commitment. There were no rooms available for involuntary commitment at [facility] which had obstetrical services available to them. She stated that she thinks she had been contaminated with bugs at that time. She was also found to be pregnant  with complaints of abdominal pain. The patient had had no prenatal care. During that time in the hospital evaluation seems that she stated she had been off "off my medications for a long time". She has multiple allergies without great explanation. She was angry, hostile, disorganized and tangential. Recommended Zyprexa be started because she is not allergic to that. She was transferred to the [facility] psychiatric unit on 07/06/2019. Patient refused to even consider taking any kind of medication at that time. She stated that that time she did not want to take anything that would cause any risk to her unborn child. She was discharged that same day from [place]. The patient complained of possible leaking of gestational fluid. An ultrasound which had been done 3 days earlier showed that the fetus was at 38 weeks and 4 days. She had a history of a previous C-section in 2020. Plans were for follow-up of the patient in [place] for her delivery. She read presented to the [facility] system through emergency medicine and labor delivery. At that time she complained of diffuse abdominal pain. She was evaluated by the obstetrical service, and decision was made that she was in active labor. The child was delivered on 07/10/2019 via vaginal delivery after cesarean. Psychiatric consultation was obtained, and the recommendation was not to have the infant in the room with the patient secondary to her clear paranoia and agitation. Zyprexa 10 mg IM p.o. twice daily with Benadryl 50 IM was recommended as needed. She remained in the hospital on the obstetrical ward, and transferred to the psychiatric unit was achieved on 07/13/2019. On arrival to our facility she was significantly angry, hostile, yelling and paranoid. She was quite delusional and disorganized. Full mental status examination was limited, and the patient was escorted to the 500 unit where she was admitted. The decision was made to  give her 10 mg of Zyprexa IM, 2 mg of Ativan IM and 25 mg of Benadryl IM x1 to calm her down and to prevent danger to other patients, this patient as well as staff. We have limited information on her, but at least her current presentation is consistent with schizoaffective disorder; bipolar type versus bipolar disorder, manic with psychotic features. She was admitted to the hospital for evaluation and stabilization. Review of the electronic medical record was limited, but it does appear that she has been admitted to [facility] [place] previously for psychiatric reasons. Her diagnosis per those charts include schizoaffective disorder, brief psychotic disorder, schizophrenia. Unfortunately no other those records had medication records. The last admission that I was able to find was on 05/09/2017 at our facility. Her discharge medications at that time included ziprasidone 20 mg p.o. twice daily, hydroxyzine prior to that there was admission on 10/23/2016 that showed her discharge medications including Abilify, hydroxyzine and temazepam. She is also being treated with Metformin at bedtime.  Ms. Lovena Le was admitted  for psychosis postpartum as described above. She remained on the [facility] unit for six days. She was started on Depakote and Zyprexa. She participated in group therapy on the unit. She responded well to treatment. She has shown calmer mood and affect with improved sleep and interaction. She denies any SI/HI/AVH and contracts for safety. She is discharging on the medications listed below. She agrees to follow up at the [place] (see below). Patient is provided with prescriptions for medications upon discharge. She is discharging back to her hotel via [employer].  Physical Findings: AIMS:  , ,  ,  ,    CIWA:    COWS:     Musculoskeletal: Strength & Muscle Tone: within normal limits Gait & Station: normal Patient leans:  N/A  Psychiatric Specialty Exam: Physical Exam Vitals and nursing note reviewed.  Constitutional:      Appearance: She is well-developed.  Cardiovascular:     Rate and Rhythm: Normal rate.  Pulmonary:     Effort: Pulmonary effort is normal.  Neurological:     Mental Status: She is alert and oriented to person, place, and time.     Review of Systems  Constitutional: Negative.   Psychiatric/Behavioral: Positive for sleep disturbance. Negative for agitation, behavioral problems, confusion, decreased concentration, dysphoric mood, hallucinations, self-injury and suicidal ideas. The patient is not nervous/anxious and is not hyperactive.     Blood pressure (!) 118/95, pulse 99, temperature 97.7 F (36.5 C), temperature source Oral, resp. rate 20, SpO2 99 %, unknown if currently breastfeeding.There is no height or weight on file to calculate BMI.  See MD's discharge SRA      Has this patient used any form of tobacco in the last 30 days? (Cigarettes, Smokeless Tobacco, Cigars, and/or Pipes)  No  Blood Alcohol level:  Lab Results  Component Value Date   ETH <10 11/19/2017   ETH <10 29/93/7169    Metabolic Disorder Labs:  Lab Results  Component Value Date   HGBA1C 5.7 (H) 07/04/2019   MPG 116.89 07/04/2019   MPG 97 10/07/2017   Lab Results  Component Value Date   PROLACTIN 46.5 (H) 07/25/2015   PROLACTIN 21.6 02/01/2015   Lab Results  Component Value Date   CHOL 182 10/07/2017   TRIG 46 10/07/2017   HDL 65 10/07/2017   CHOLHDL 2.8 10/07/2017   VLDL 9 10/07/2017   LDLCALC 108 (H) 10/07/2017   LDLCALC 73 07/25/2015    See Psychiatric Specialty Exam and Suicide Risk Assessment completed by Attending Physician prior to discharge.  Discharge destination:  Home  Is patient on multiple antipsychotic therapies at discharge:  No   Has Patient had three or more failed trials of antipsychotic monotherapy by history:  No  Recommended Plan for Multiple Antipsychotic  Therapies: NA  Discharge Instructions    Discharge instructions   Complete by: As directed    Follow up with [facility] for [employer]. Activity as tolerated. Diet as recommended by primary care physician. Keep all scheduled follow-up appointments as recommended.     Allergies as of 07/19/2019      Reactions   Quetiapine Anaphylaxis   "Pass out"   Gabapentin Other (See Comments)   "Feet on fire"   Lorazepam Other (See Comments)   unknown   Risperidone Other (See Comments)   Pt reports "It makes me blind"   Valproic Acid Other (See Comments)   Pt reports "it makes me too sleepy"   Aripiprazole Other (  See Comments)   Makes patient "too tired"   Fluphenazine Rash   Haloperidol Other (See Comments)   Makes feel hot inside. "Tires my tongue"   Trazodone Other (See Comments)   Ziprasidone Hcl Palpitations      Medication List    STOP taking these medications   melatonin 3 MG Tabs tablet     TAKE these medications     Indication  divalproex 500 MG 24 hr tablet Commonly known as: DEPAKOTE ER Take 1 tablet (500 mg total) by mouth at bedtime.  Indication: MIXED BIPOLAR AFFECTIVE DISORDER   divalproex 250 MG 24 hr tablet Commonly known as: DEPAKOTE ER Take 1 tablet (250 mg total) by mouth every morning. Start taking on: July 20, 2019  Indication: MIXED BIPOLAR AFFECTIVE DISORDER   hydrocerin Crea Apply 1 application topically 2 (two) times daily.  Indication: dry skin   metoprolol succinate 25 MG 24 hr tablet Commonly known as: TOPROL-XL Take 1 tablet (25 mg total) by mouth daily. Start taking on: July 20, 2019  Indication: High Blood Pressure Disorder   OLANZapine 7.5 MG tablet Commonly known as: ZYPREXA Take 1 tablet (7.5 mg total) by mouth at bedtime. What changed:   medication strength  how much to take  when to take this  Indication: Manic Phase of Manic-Depression   OLANZapine 2.5 MG tablet Commonly known as: ZYPREXA Take 1 tablet (2.5 mg  total) by mouth every morning. Start taking on: July 20, 2019 What changed:   medication strength  how much to take  when to take this  Indication: Manic Phase of Manic-Depression   prenatal multivitamin Tabs tablet Take 1 tablet by mouth daily at 12 noon.  Indication: Pregnancy   spironolactone 25 MG tablet Commonly known as: ALDACTONE Take 0.5 tablets (12.5 mg total) by mouth daily. Start taking on: July 20, 2019  Indication: Abnormal Growth of Body or Facial Hair       [facility]. Call.   Why: A referral has been made on your behalf.  Please contact this provider with your EMAIL address in order to obtain medication management and therapy services. Contact information: [place] [address] [place] Follow up.   Why: Go to this clinic for follow-up labwork 07/21/19. Contact information: [address] [place] [address] [phone]              Follow-up recommendations: Activity as tolerated. Diet as recommended by primary care physician. Keep all scheduled follow-up appointments as recommended.   Comments:   Patient is instructed to take all prescribed medications as recommended. Report any side effects or adverse reactions to your outpatient psychiatrist. Patient is instructed to abstain from alcohol and illegal drugs while on prescription medications. In the event of worsening symptoms, patient is instructed to call the crisis hotline, 911, or go to the nearest emergency department for evaluation and treatment.  Signed: Connye Burkitt, NP 07/19/2019, 2:18 PM

## 2019-07-26 NOTE — BH Specialist Note (Signed)
Pt did not arrive to video visit and did not answer the phone ; Left HIPPA-compliant message to call back Roselyn Reef from [facility] [employer] at [facility] at [phone] (main office) or [phone] ([place] office).    [facility] via Telemedicine Video Publishing rights manager) Visit  07/26/2019 [name] [name] [phone]  [name]

## 2019-07-27 ENCOUNTER — Ambulatory Visit: Payer: Medicare Other

## 2019-08-02 ENCOUNTER — Other Ambulatory Visit: Payer: Self-pay

## 2019-08-02 ENCOUNTER — Ambulatory Visit: Payer: Medicare Other | Admitting: Clinical

## 2019-08-02 DIAGNOSIS — Z91199 Patient's noncompliance with other medical treatment and regimen due to unspecified reason: Secondary | ICD-10-CM

## 2019-08-17 ENCOUNTER — Ambulatory Visit: Payer: Medicare Other | Admitting: Obstetrics & Gynecology

## 2019-08-17 ENCOUNTER — Encounter: Payer: Self-pay | Admitting: Obstetrics & Gynecology

## 2019-09-09 DIAGNOSIS — Z91199 Patient's noncompliance with other medical treatment and regimen due to unspecified reason: Secondary | ICD-10-CM | POA: Insufficient documentation

## 2019-09-09 DIAGNOSIS — R451 Restlessness and agitation: Secondary | ICD-10-CM | POA: Insufficient documentation

## 2019-09-29 ENCOUNTER — Emergency Department (HOSPITAL_COMMUNITY)
Admission: EM | Admit: 2019-09-29 | Discharge: 2019-10-04 | Disposition: A | Discharge status: Other Acute Inpatient Hospital | Payer: Medicare Other | Attending: Emergency Medicine | Admitting: Emergency Medicine

## 2019-09-29 ENCOUNTER — Other Ambulatory Visit: Payer: Self-pay

## 2019-09-29 ENCOUNTER — Encounter (HOSPITAL_COMMUNITY): Payer: Self-pay | Admitting: Emergency Medicine

## 2019-09-29 DIAGNOSIS — Z20822 Contact with and (suspected) exposure to covid-19: Secondary | ICD-10-CM | POA: Diagnosis not present

## 2019-09-29 DIAGNOSIS — G8929 Other chronic pain: Secondary | ICD-10-CM | POA: Diagnosis not present

## 2019-09-29 DIAGNOSIS — F209 Schizophrenia, unspecified: Secondary | ICD-10-CM | POA: Insufficient documentation

## 2019-09-29 DIAGNOSIS — M79671 Pain in right foot: Secondary | ICD-10-CM | POA: Insufficient documentation

## 2019-09-29 DIAGNOSIS — F29 Unspecified psychosis not due to a substance or known physiological condition: Secondary | ICD-10-CM | POA: Diagnosis not present

## 2019-09-29 DIAGNOSIS — M79672 Pain in left foot: Secondary | ICD-10-CM | POA: Diagnosis not present

## 2019-09-29 DIAGNOSIS — R479 Unspecified speech disturbances: Secondary | ICD-10-CM | POA: Insufficient documentation

## 2019-09-29 DIAGNOSIS — R69 Illness, unspecified: Secondary | ICD-10-CM | POA: Diagnosis present

## 2019-09-29 LAB — CBC WITH DIFFERENTIAL/PLATELET
Abs Immature Granulocytes: 0 10*3/uL (ref 0.00–0.07)
Basophils Absolute: 0 10*3/uL (ref 0.0–0.1)
Basophils Relative: 1 %
Eosinophils Absolute: 0 10*3/uL (ref 0.0–0.5)
Eosinophils Relative: 0 %
HCT: 34.1 % — ABNORMAL LOW (ref 36.0–46.0)
Hemoglobin: 10.7 g/dL — ABNORMAL LOW (ref 12.0–15.0)
Immature Granulocytes: 0 %
Lymphocytes Relative: 42 %
Lymphs Abs: 1.9 10*3/uL (ref 0.7–4.0)
MCH: 30.1 pg (ref 26.0–34.0)
MCHC: 31.4 g/dL (ref 30.0–36.0)
MCV: 95.8 fL (ref 80.0–100.0)
Monocytes Absolute: 0.4 10*3/uL (ref 0.1–1.0)
Monocytes Relative: 9 %
Neutro Abs: 2.2 10*3/uL (ref 1.7–7.7)
Neutrophils Relative %: 48 %
Platelets: 326 10*3/uL (ref 150–400)
RBC: 3.56 MIL/uL — ABNORMAL LOW (ref 3.87–5.11)
RDW: 14.1 % (ref 11.5–15.5)
WBC: 4.5 10*3/uL (ref 4.0–10.5)
nRBC: 0 % (ref 0.0–0.2)

## 2019-09-29 LAB — ETHANOL: Alcohol, Ethyl (B): <10 mg/dL (ref <10)

## 2019-09-29 LAB — COMPREHENSIVE METABOLIC PANEL
ALT: 15 U/L (ref 0–44)
AST: 19 U/L (ref 15–41)
Albumin: 4.1 g/dL (ref 3.5–5.0)
Alkaline Phosphatase: 49 U/L (ref 38–126)
Anion gap: 11 (ref 5–15)
BUN: 11 mg/dL (ref 6–20)
CO2: 25 mmol/L (ref 22–32)
Calcium: 9.9 mg/dL (ref 8.9–10.3)
Chloride: 104 mmol/L (ref 98–111)
Creatinine, Ser: 0.91 mg/dL (ref 0.44–1.00)
GFR calc Af Amer: >60 mL/min (ref >60)
GFR calc non Af Amer: >60 mL/min (ref >60)
Glucose, Bld: 100 mg/dL — ABNORMAL HIGH (ref 70–99)
Potassium: 3.9 mmol/L (ref 3.5–5.1)
Sodium: 140 mmol/L (ref 135–145)
Total Bilirubin: 0.6 mg/dL (ref 0.3–1.2)
Total Protein: 7.9 g/dL (ref 6.5–8.1)

## 2019-09-29 LAB — I-STAT CHEM 8, ED
BUN: 12 mg/dL (ref 6–20)
Calcium, Ion: 1.19 mmol/L (ref 1.15–1.40)
Chloride: 104 mmol/L (ref 98–111)
Creatinine, Ser: 0.9 mg/dL (ref 0.44–1.00)
Glucose, Bld: 99 mg/dL (ref 70–99)
HCT: 33 % — ABNORMAL LOW (ref 36.0–46.0)
Hemoglobin: 11.2 g/dL — ABNORMAL LOW (ref 12.0–15.0)
Potassium: 3.7 mmol/L (ref 3.5–5.1)
Sodium: 140 mmol/L (ref 135–145)
TCO2: 25 mmol/L (ref 22–32)

## 2019-09-29 LAB — I-STAT BETA HCG BLOOD, ED (MC, WL, AP ONLY): I-stat hCG, quantitative: <5 m[IU]/mL (ref <5)

## 2019-09-29 MED ORDER — STERILE WATER FOR INJECTION IJ SOLN
INTRAMUSCULAR | Status: AC
  Filled 2019-09-29: qty 10

## 2019-09-29 MED ORDER — ZIPRASIDONE MESYLATE 20 MG IM SOLR
INTRAMUSCULAR | Status: AC
  Administered 2019-09-29: 20 mg
  Filled 2019-09-29: qty 20

## 2019-09-29 MED ORDER — GABAPENTIN 100 MG PO CAPS: 100.0000 mg | ORAL_CAPSULE | Freq: Two times a day (BID) | ORAL | 0 refills | Status: DC

## 2019-09-29 NOTE — ED Notes (Signed)
Patient continues to ramble, flight of ideas. Patient in hall cursing. Patient swinging at security and [employer]. Dr Zenia Resides to IVC patient

## 2019-09-29 NOTE — Discharge Instructions (Signed)
Pregnancy test is negative.  Lab work today is overall unremarkable.  You have normal electrolytes and normal kidney function.

## 2019-09-29 NOTE — ED Notes (Signed)
Patient has apologized to this RN. Patient cooperative and requesting shower. This RN escorted patient to shower. Patient has been changed into scrubs. 3 rings remain on L hand.

## 2019-09-29 NOTE — ED Triage Notes (Signed)
Pt to triage via [facility] from a house.  Pt states she doesn't know her name.  Called EMS to check her blood sugar (132) and pt talking about wanting to talk to social worker to "get daughter back".  Pt talks about playing musical instruments at different churches and people giving her gifts.  Denies SI/HI.  States she believes she has eczema all over.

## 2019-09-29 NOTE — ED Notes (Signed)
Patient has been removed from restrains. Drowsy in bed. Not threatening towards staff. Food and drink offered.

## 2019-09-29 NOTE — ED Notes (Signed)
Patient ambulated to bathroom with stand by assist. Hostile, guarded, and paranoid. Patient escorted back to bed
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
## 2019-09-29 NOTE — ED Notes (Signed)
Patient remains too uncooperative at this time to change into scrubs. Belongings have been removed. Bible remains with patient

## 2019-09-29 NOTE — ED Notes (Signed)
Pt refusing to change into scrubs.

## 2019-09-29 NOTE — ED Notes (Signed)
Pt continues to ramble, making threats of hard to this RN, socks placed on pt per her request.

## 2019-09-29 NOTE — ED Notes (Signed)
Taxi voucher has been provided

## 2019-09-30 DIAGNOSIS — M79671 Pain in right foot: Secondary | ICD-10-CM | POA: Diagnosis not present

## 2019-09-30 LAB — RAPID URINE DRUG SCREEN, HOSP PERFORMED
Amphetamines: NOT DETECTED
Barbiturates: NOT DETECTED
Benzodiazepines: NOT DETECTED
Cocaine: NOT DETECTED
Opiates: NOT DETECTED
Tetrahydrocannabinol: NOT DETECTED

## 2019-09-30 LAB — RESPIRATORY PANEL BY RT PCR (FLU A&B, COVID)
Influenza A by PCR: NEGATIVE
Influenza B by PCR: NEGATIVE
SARS Coronavirus 2 by RT PCR: NEGATIVE

## 2019-09-30 MED ORDER — ONDANSETRON HCL 4 MG PO TABS: 4.0000 mg | ORAL_TABLET | Freq: Three times a day (TID) | ORAL | Status: DC | PRN

## 2019-09-30 MED ORDER — ALUM & MAG HYDROXIDE-SIMETH 200-200-20 MG/5ML PO SUSP
30.0000 mL | Freq: Four times a day (QID) | ORAL | Status: DC | PRN
  Administered 2019-10-02: 30 mL via ORAL
  Filled 2019-09-30: qty 30

## 2019-09-30 MED ORDER — ACETAMINOPHEN 325 MG PO TABS
650.0000 mg | ORAL_TABLET | ORAL | Status: DC | PRN
  Filled 2019-09-30: qty 2

## 2019-09-30 MED ORDER — ZOLPIDEM TARTRATE 5 MG PO TABS
5.0000 mg | ORAL_TABLET | Freq: Every evening | ORAL | Status: DC | PRN
  Administered 2019-09-30: 5 mg via ORAL
  Filled 2019-09-30 (×2): qty 1

## 2019-09-30 MED ORDER — LORAZEPAM 1 MG PO TABS
2.0000 mg | ORAL_TABLET | Freq: Once | ORAL | Status: AC
  Administered 2019-10-02: 2 mg via ORAL
  Filled 2019-09-30: qty 2

## 2019-09-30 MED ORDER — ZIPRASIDONE MESYLATE 20 MG IM SOLR
20.0000 mg | Freq: Once | INTRAMUSCULAR | Status: AC
  Administered 2019-09-30: 20 mg via INTRAMUSCULAR
  Filled 2019-09-30: qty 20

## 2019-09-30 NOTE — ED Notes (Signed)
Patient up walking through the hallways, flights of ideas with conversation. Sitter walked patient to the shower.
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
## 2019-09-30 NOTE — ED Provider Notes (Signed)
Emergency Medicine Observation Re-evaluation Note  [name] [name] is a 39 y.o. female, seen on rounds today.  Pt initially presented to the ED for complaints of Manic Behavior Currently, the patient is intermittently talking with staff.  Physical Exam  BP (!) 121/93 (BP Location: Right Arm)   Pulse 80   Temp 97.8 F (36.6 C) (Oral)   Resp 16   Ht 5\' 4"  (1.626 m)   Wt 72.6 kg   SpO2 100%   BMI 27.46 kg/m  Physical Exam   No diaphoresis.  No pallor.  Pulmonary: No increased work of breathing.  Speaks in full sentences without difficulty. No tachypnea.   Cardiac: Normal rate and regular.   Neurologic: Moves all extremities.  Follows commands.  Psych: Speech is rapid and pressured.  Flight of ideas.  Tangential.  ED Course / MDM  EKG:    I have reviewed the labs performed to date as well as medications administered while in observation.  Patient has not needed IM medications for agitation since yesterday.  Plan  Current plan is for inpatient psychiatric management.  Waiting for placement. Patient is under full IVC at this time.  There are no home meds to order.  She does not know if she has a history of anemia.  Refused oral Ativan.  So far, patient has stayed in her room and has not needed IM injections today.   Lorayne Bender, PA-C 09/30/19 1754    Blanchie Dessert, MD 10/07/19 2210

## 2019-09-30 NOTE — Progress Notes (Signed)
[facility] is considering patient.  Requesting additional information to review (IVC paperwork, Nurses notes).  Spoke with Donah Driver, RN who will fax the additional information. FAX# [place] Disposition, CSW [phone] (cell)

## 2019-09-30 NOTE — ED Notes (Signed)
4 bags in [place] #6

## 2019-09-30 NOTE — Progress Notes (Signed)
Pt meets inpatient criteria per Lindon Romp, NP. Referral information has been sent to the following hospitals for review:  [facility]  [facility]     Disposition will continue to follow for inpatient placement needs.    Audree Camel, MSW, LCSW, [place] Clinical Social Worker II Disposition CSW [phone]

## 2019-10-01 DIAGNOSIS — M79671 Pain in right foot: Secondary | ICD-10-CM | POA: Diagnosis not present

## 2019-10-01 MED ORDER — OLANZAPINE 5 MG PO TABS
2.5000 mg | ORAL_TABLET | Freq: Every day | ORAL | Status: DC
  Administered 2019-10-03: 2.5 mg via ORAL
  Filled 2019-10-01 (×2): qty 1

## 2019-10-01 MED ORDER — STERILE WATER FOR INJECTION IJ SOLN
INTRAMUSCULAR | Status: AC
  Filled 2019-10-01: qty 10

## 2019-10-01 MED ORDER — LORAZEPAM 2 MG/ML IJ SOLN
INTRAMUSCULAR | Status: AC
  Administered 2019-10-01: 2 mg via INTRAMUSCULAR
  Filled 2019-10-01: qty 1

## 2019-10-01 MED ORDER — LORAZEPAM 2 MG/ML IJ SOLN: 1.0000 mg | Freq: Once | INTRAMUSCULAR | Status: DC

## 2019-10-01 MED ORDER — LORAZEPAM 2 MG/ML IJ SOLN
2.0000 mg | INTRAMUSCULAR | Status: AC | PRN
  Administered 2019-10-02: 2 mg via INTRAMUSCULAR
  Filled 2019-10-01: qty 1

## 2019-10-01 MED ORDER — ZIPRASIDONE MESYLATE 20 MG IM SOLR
20.0000 mg | Freq: Once | INTRAMUSCULAR | Status: AC | PRN
  Administered 2019-10-01: 20 mg via INTRAMUSCULAR
  Filled 2019-10-01: qty 20

## 2019-10-01 MED ORDER — DIVALPROEX SODIUM ER 500 MG PO TB24
500.0000 mg | ORAL_TABLET | Freq: Every day | ORAL | Status: DC
  Administered 2019-10-03 (×2): 500 mg via ORAL
  Filled 2019-10-01 (×3): qty 1

## 2019-10-01 MED ORDER — ZIPRASIDONE MESYLATE 20 MG IM SOLR
10.0000 mg | INTRAMUSCULAR | Status: AC | PRN
  Administered 2019-10-02: 10 mg via INTRAMUSCULAR
  Filled 2019-10-01: qty 20

## 2019-10-01 MED ORDER — OLANZAPINE 5 MG PO TABS
7.5000 mg | ORAL_TABLET | Freq: Every day | ORAL | Status: DC
  Administered 2019-10-02 – 2019-10-03 (×3): 7.5 mg via ORAL
  Filled 2019-10-01: qty 1
  Filled 2019-10-01: qty 2
  Filled 2019-10-01: qty 1
  Filled 2019-10-01: qty 2

## 2019-10-01 MED ORDER — DIPHENHYDRAMINE HCL 50 MG/ML IJ SOLN: 25.0000 mg | Freq: Once | INTRAMUSCULAR | Status: DC

## 2019-10-01 MED ORDER — ZIPRASIDONE MESYLATE 20 MG IM SOLR
INTRAMUSCULAR | Status: AC
  Administered 2019-10-01: 10 mg via INTRAMUSCULAR
  Filled 2019-10-01: qty 20

## 2019-10-01 MED ORDER — ZIPRASIDONE MESYLATE 20 MG IM SOLR: 10.0000 mg | Freq: Once | INTRAMUSCULAR | Status: AC

## 2019-10-01 MED ORDER — DIVALPROEX SODIUM ER 250 MG PO TB24
250.0000 mg | ORAL_TABLET | Freq: Every day | ORAL | Status: DC
  Filled 2019-10-01 (×3): qty 1

## 2019-10-01 MED ORDER — STERILE WATER FOR INJECTION IJ SOLN
INTRAMUSCULAR | Status: AC
  Administered 2019-10-01: 1 mL
  Filled 2019-10-01: qty 10

## 2019-10-01 NOTE — ED Notes (Addendum)
Pt is out in hallway preaching, yelling, and having flight of ideas, pt is hyper religious, hypersexual and yelling racial slurs towards staff and visitors

## 2019-10-01 NOTE — ED Notes (Signed)
Pt in standing room talking to self
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
## 2019-10-01 NOTE — ED Provider Notes (Signed)
Patient care assumed at 1100.  Pt with hx/o schizophrenia in ED under IVC with acute psychosis and agitation.  Has received multiple doses of geodon and ativan with persistent agitation, threatening toward staff.  Will remedicate with ativan and benadryl.     Quintella Reichert, MD 10/01/19 1816

## 2019-10-01 NOTE — BH Assessment (Signed)
TTS had planned on re-assessing patient, but based on what staff has charted this morning concerning patient's behavior, continued inpatient is recommended.  Social Work is currently seeking placement for her.

## 2019-10-01 NOTE — ED Notes (Addendum)
Pt refusing all medication. Pt states "I have the right to refuse".  MD aware and will order IM medications.

## 2019-10-01 NOTE — ED Notes (Signed)
Pt asked multiple times if she can have her vitals obtained and she refused.

## 2019-10-01 NOTE — ED Notes (Signed)
TTS Breakfast Ordered
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
## 2019-10-01 NOTE — ED Notes (Signed)
Pt refused all medications and refused VS. Pt educated on why this RN needs VS in chart; pt states "I have a right to refuse". Pt will not open eyes and is not talking to this RN anymore.

## 2019-10-01 NOTE — ED Notes (Signed)
Attempted to administer PO medications to patient. Patient once again stated "I do not take any medications and will not be taking any" and pulled the blanket over her head.  MD aware.

## 2019-10-02 DIAGNOSIS — M79671 Pain in right foot: Secondary | ICD-10-CM | POA: Diagnosis not present

## 2019-10-02 MED ORDER — ZIPRASIDONE MESYLATE 20 MG IM SOLR
20.0000 mg | Freq: Four times a day (QID) | INTRAMUSCULAR | Status: DC | PRN
  Filled 2019-10-02: qty 20

## 2019-10-02 MED ORDER — STERILE WATER FOR INJECTION IJ SOLN
INTRAMUSCULAR | Status: AC
  Filled 2019-10-02: qty 10

## 2019-10-02 MED ORDER — DOCUSATE SODIUM 100 MG PO CAPS: 100.0000 mg | ORAL_CAPSULE | Freq: Every day | ORAL | Status: DC
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
## 2019-10-02 NOTE — ED Notes (Signed)
Per previous RN, vitals not obtained due to pt's violent outbursts. Pt noted to be resting, respirations even & unlabored, NAD noted. Will continue to monitor

## 2019-10-02 NOTE — ED Notes (Signed)
3 rings taken off of pt and given to security

## 2019-10-02 NOTE — ED Notes (Signed)
TTS machine at bedside waiting for assessment.
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
## 2019-10-02 NOTE — ED Notes (Signed)
Pt is coming out of room thinking that staff is talking about her and is yelling that she wants to be moved to another department. Security has been called. Pt is not being aggressive or physical at this time. Pt has been walked to bathroom by the sitter.

## 2019-10-02 NOTE — BHH Counselor (Signed)
Pt was reassessed this AM.  She was noticeably irritated and expressed impatience at remaining in the ED.  Pt denied suicidal ideation, homicidal ideation, hallucination, and self-injurious behavior.  Pt stated that she came to the hospital because she thought she was pregnant, and since she is not, she wants to leave.  She stated that she lives with a pastor -- Mr. Marcelino Scot at [phone].    With her permission, spoke with Mr. Marcelino Scot, who confirmed that he lives with her.  He stated that Pt is not compliant with medication.  He stated that previously she received an anti-psychotic injection, but she is not longer taking medication.  He stated that she does not do well when given prescriptions for self-administration of medication.  Per Mr. Marcelino Scot, Pt has been hallucinating, staying up for several nights at a time, and becoming more aggressive.  He asked that she remain at the ED to be stabilized.  Recommend continued inpatient.

## 2019-10-02 NOTE — ED Provider Notes (Signed)
Emergency Medicine Observation Re-evaluation Note  [name] [name] is a 39 y.o. female, seen on rounds today.  Pt initially presented to the ED for complaints of Manic Behavior Currently, the patient is intermittently agitated, requiring doses of injected medications, continuing to refuse oral medications.  Physical Exam  BP 117/87 (BP Location: Right Arm)   Pulse 72   Temp 97.9 F (36.6 C) (Axillary)   Resp 10   Ht 5\' 4"  (1.626 m)   Wt 72.6 kg   SpO2 100%   BMI 27.46 kg/m  Physical Exam   Physical Exam:  No diaphoresis.  No pallor.  Pulmonary: No increased work of breathing.  Speaks in full sentences without difficulty. No tachypnea.   Cardiac: Normal rate and regular.   Abdominal:  No guarding.  Neurologic: Moves each of her extremities. No speech deficit.  Psych: Patient still exhibiting symptoms of psychosis. She does not seem to have good logical reasoning ability currently. She is fixated on returning to live with her pastor.  ED Course / MDM  EKG:EKG Interpretation  Date/Time:  Saturday October 01 2019 12:43:23 EDT Ventricular Rate:  79 PR Interval:    QRS Duration: 81 QT Interval:  364 QTC Calculation: 418 R Axis:   47 Text Interpretation: Sinus rhythm Probable left atrial enlargement Low voltage, precordial leads ST elev, probable normal early repol pattern Confirmed by Quintella Reichert [phone]) on 10/01/2019 1:04:38 PM    I have reviewed the labs performed to date as well as medications administered while in observation.    Plan  Current plan is for inpatient psychiatric management. Patient is under full IVC at this time.   Patient exhibiting symptoms of psychosis. She is requiring periodic injections for behavioral outbursts. She continues to refuse oral medications. No further treatment recommendations have been offered by psych team.  8:54 PM when given a choice between additional Geodon injection or taking the oral medications that have been  prescribed, patient endorses desire for taking oral medications.    Lorayne Bender, PA-C 10/02/19 2058    Gareth Morgan, MD 10/03/19 [phone]

## 2019-10-02 NOTE — ED Notes (Signed)
Breakfast Ordered 
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
## 2019-10-03 DIAGNOSIS — M79671 Pain in right foot: Secondary | ICD-10-CM | POA: Diagnosis not present

## 2019-10-03 NOTE — ED Notes (Addendum)
PASTOR CREECY [phone]. RN spoke with him and he is willing to take pt bc states unsafe for her on street. He will help ensure she takes her meds. She states she would need prescription for her meds however.
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
## 2019-10-03 NOTE — ED Notes (Signed)
Social worker called requesting to speak with pt. Pt currently on phone with SW at desk.

## 2019-10-03 NOTE — Progress Notes (Signed)
Pt accepted to  [facility]: [address], [place], [place] [address]    [phone]   Dr. Jonelle Sports  Call report to [phone]  Pt is IVC  Pt may be transported by law enforcement  Pt scheduled  to arrive at [facility] after 9 a.m.  Clinician notified patients nurse of acceptance.  [place] Disposition, CSW [phone] (cell)

## 2019-10-03 NOTE — ED Notes (Signed)
Pt awake & washing face at sink in room. Still refusing vital signs. NAD noted at this time, no needs/requests made.

## 2019-10-03 NOTE — BH Assessment (Signed)
TTS reassessment: Patient is alert and oriented x 3. She is dressed in scrubs and her affect is flat. Patient is initially calm during assessment, however as she continues to talk she becomes more agitated and begins to raise her voice at assessor. She appears manic. Patient states "I came here because I thought I was pregnant. I know I'm not so I want to go home." She tells this assessor false information. 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 She states her last psychiatrist at [facility] told her she did not need medications. 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 She states "I've never been manic." She also states "I have no problem behaviors. I have been calm the entire time I have been here." Per chart review patient has been intermittently agitated requiring medications. This counselor informed her that at this time was recommended to go into a psychiatric hospital.  This counselor attempted to reach patient's pastor at [phone]. He did not answer and there was no option for voicemail. However, yesterday pastor requested patient be hospitalized due to medication non-compliance and erratic behavior.   Patient continues to meet in patient care criteria.

## 2019-10-03 NOTE — Progress Notes (Signed)
Patient meets inpatient criteria.  Re Faxed information to the following facilities:  [place]  [facility]  [facility]  [facility]   CSW will continue to follow up.  [place] Disposition, CSW [phone] (cell)

## 2019-10-03 NOTE — ED Notes (Signed)
Patient noted to be resting. Respirations equal and unlabored, NAD noted. Sitter at bedside. Will continue to monitor.

## 2019-10-03 NOTE — ED Provider Notes (Addendum)
Emergency Medicine Observation Re-evaluation Note  [name] [name] is a 39 y.o. female, seen on rounds today.  Pt initially presented to the ED for complaints of Manic Behavior Currently, the patient is asking to be with [name].  More cooperative with medication administration.  Expresses that she wants to go home and that she does not want to be here anymore.  No medical complaints.  Physical Exam  BP (!) 122/94 (BP Location: Right Arm)   Pulse 88   Temp 97.6 F (36.4 C)   Resp 16   Ht 5\' 4"  (1.626 m)   Wt 72.6 kg   SpO2 100%   BMI 27.46 kg/m  Physical Exam Vitals and nursing note reviewed. Exam conducted with a chaperone present.  Constitutional:      Appearance: Normal appearance.  HENT:     Head: Normocephalic and atraumatic.  Eyes:     General: No scleral icterus.    Conjunctiva/sclera: Conjunctivae normal.  Cardiovascular:     Rate and Rhythm: Normal rate.  Pulmonary:     Effort: Pulmonary effort is normal.  Skin:    General: Skin is dry.  Neurological:     Mental Status: She is alert.     GCS: GCS eye subscore is 4. GCS verbal subscore is 5. GCS motor subscore is 6.  Psychiatric:        Mood and Affect: Mood normal.        Behavior: Behavior normal.    ED Course / MDM  EKG:EKG Interpretation  Date/Time:  Saturday October 01 2019 12:43:23 EDT Ventricular Rate:  79 PR Interval:    QRS Duration: 81 QT Interval:  364 QTC Calculation: 418 R Axis:   47 Text Interpretation: Sinus rhythm Probable left atrial enlargement Low voltage, precordial leads ST elev, probable normal early repol pattern Confirmed by Quintella Reichert [phone]) on 10/01/2019 1:04:38 PM    I have reviewed the labs performed to date as well as medications administered while in observation.  Recent changes in the last 24 hours include no longer refusing oral medication.    Plan  Current plan reportedly is for IVC rescind and discharge into care of her pastor, per psychiatry.   Will wait until formal note by TTS is placed.   Patient is under full IVC at this time.   Corena Herter, PA-C 10/03/19 1245    Corena Herter, PA-C 10/03/19 1245    Pattricia Boss, MD 10/07/19 1348

## 2019-10-03 NOTE — ED Notes (Signed)
Pt given paper and crayon to record numbers given to her while speaking with Education officer, museum. Pt making outgoing phone call at this time, states call is to a Chief Executive Officer.

## 2019-10-03 NOTE — ED Notes (Signed)
Pt states she is ok to go and has been complaint with taking her meds and has someone at home who can make sure she takes them. She states she no longer needs to be here. Pt redirected back to room after taking a shower.

## 2019-10-03 NOTE — ED Notes (Signed)
Pt taking shower.  

## 2019-10-03 NOTE — Progress Notes (Signed)
Went to get vitals and patient refused and was anger

## 2019-10-04 ENCOUNTER — Encounter (HOSPITAL_COMMUNITY): Payer: Self-pay | Admitting: Psychiatry

## 2019-10-04 NOTE — ED Provider Notes (Signed)
Patient being transferred to [facility] under ivc. Patient seen this am by Dr. Regenia Skeeter and emtala documents signed. I redid section 4 - patient hemodynamically stable for d/c    Pattricia Boss, MD 10/04/19 1008

## 2019-10-04 NOTE — ED Notes (Signed)
Spoke with Sgt. Deri Fuelling [facility] [employer] [phone] for patient transport to [facility]

## 2019-10-04 NOTE — ED Notes (Signed)
Patient made calls this morning x 2. Noted patient speaks of mistreatment in the facility to whomever she was "speaking with". Patient speaks of no desire to hurt self or others and informing the other party that she is being transferred to [facility]. Patient attempted to make multiple outgoing phone calls, informed patient that she is allowed 2 calls per day. Patient sets the receiver down and stated "Leave me alone, don't talk to me". Patient then walk back to her room and attempted to close door. Sitter re-directed patient and left door open but closed the curtain. Opened the curtain and told patient of the current safety protocols, patient in chair reading, patient stated "fine, but don't talk to me, leave me alone".

## 2019-10-04 NOTE — ED Provider Notes (Signed)
Patient is accepted to [facility]. Accepting physician is Dr. Autumn Patty, MD 10/04/19 [phone]

## 2020-09-19 ENCOUNTER — Ambulatory Visit (HOSPITAL_COMMUNITY)
Admission: EM | Admit: 2020-09-19 | Discharge: 2020-09-19 | Disposition: A | Discharge status: Home / Self Care | Payer: Medicare Other | Attending: Family | Admitting: Family

## 2020-09-19 ENCOUNTER — Other Ambulatory Visit: Payer: Self-pay

## 2020-09-19 DIAGNOSIS — F250 Schizoaffective disorder, bipolar type: Secondary | ICD-10-CM | POA: Diagnosis present

## 2020-09-19 DIAGNOSIS — Z9114 Patient's other noncompliance with medication regimen: Secondary | ICD-10-CM | POA: Insufficient documentation

## 2020-09-19 DIAGNOSIS — Z79899 Other long term (current) drug therapy: Secondary | ICD-10-CM | POA: Insufficient documentation

## 2020-09-19 MED ORDER — OLANZAPINE 5 MG PO TBDP: 15.0000 mg | ORAL_TABLET | Freq: Once | ORAL | Status: DC

## 2020-09-19 MED ORDER — OLANZAPINE 5 MG PO TBDP
15.0000 mg | ORAL_TABLET | Freq: Every day | ORAL | Status: DC
  Filled 2020-09-19: qty 21

## 2020-09-19 MED ORDER — OLANZAPINE 7.5 MG PO TABS: 15.0000 mg | ORAL_TABLET | Freq: Every day | ORAL | Status: DC

## 2020-09-19 MED ORDER — OLANZAPINE 15 MG PO TBDP: 15.0000 mg | ORAL_TABLET | Freq: Once | ORAL | 0 refills | Status: DC

## 2020-09-19 MED ORDER — OLANZAPINE 15 MG PO TABS: 15.0000 mg | ORAL_TABLET | Freq: Every day | ORAL | 0 refills | Status: DC

## 2020-09-19 NOTE — BH Assessment (Signed)
Pt here with caregiver reporting pt has been off meds for the past five days. Caregiver reports that patient did not sleep last night and today having manic like symptoms (talking loud, singing, odd behaviors). Pt was recently in a state facility in [place] for about three months and discharged about a week ago. Pt with odd behaviors, verbal non threatening outburst. Caregiver denies concerns about safety and is willing to take pt home. Pt is routine

## 2020-09-19 NOTE — ED Provider Notes (Addendum)
Behavioral Health Urgent Care Medical Screening Exam  Patient Name: [name] [name] MRN: [id number] Date of Evaluation: 09/19/20 Chief Complaint:   Diagnosis:  Final diagnoses:  Schizoaffective disorder, bipolar type [facility])   [name] [name], 40 y.o., female patient seen face to face by this provider, consulted with Dr. Serafina Mitchell and chart reviewed on 09/19/20.  On evaluation Veanna Pettway reports   History of Present illness: [name] [name] is a 40 y.o. female presents to [facility] urgent care accompanied by her caregiver.  Who reported that patient was recently discharged from the state facility and continued due paranoia, delusions.  Reported diagnosis with schizoaffective disorder.  States she is prescribed olanzapine 15 mg p.o. states patient has been off her medication for the past 5 days because " the pharmacy will not fill the medication prescribed by out of state provider."   [name] was seen presents pressured, bizarre and hyperreligious.  She is denying suicidal or homicidal ideations.  Denies auditory and visual hallucinations.  Pharmacist provided Zyprexa Zydis 15 mg however patient refused.  Caregiver denied any safety concerns.  Support, encouragement and  reassurance was provided. During evaluation [name] is standing in no acute distress. She is alert,oriented x 4 irritable, pressured and delusional ; and mood is incongruent with affect.   NP will initiate involuntary commitment due to mania.   Psychiatric Specialty Exam  Presentation  General Appearance:Bizarre  Eye Contact:Good  Speech:Pressured  Speech Volume:Increased  Handedness:Right   Mood and Affect  Mood:Anxious; Dysphoric; Labile; Irritable  Affect:Labile   Thought Process  Thought Processes:Disorganized  Descriptions of Associations:Intact  Orientation:Full (Time, Place and Person)  Thought Content:Paranoid Ideation  Diagnosis of Schizophrenia or  Schizoaffective disorder in past: Yes  Duration of Psychotic Symptoms: Greater than six months  Hallucinations:Auditory  Ideas of Reference:Delusions; Paranoia  Suicidal Thoughts:No  Homicidal Thoughts:No   Sensorium  Memory:Immediate Fair; Recent Fair; Remote Fair  Judgment:Fair  Insight:Fair   Executive Functions  Concentration:Fair  Attention Span:Poor  Recall:Poor  Fund of Knowledge:Poor  Language:Fair   Psychomotor Activity  Psychomotor Activity:Normal   Assets  Assets:Intimacy; Desire for Improvement; Social Support   Sleep  Sleep:Fair  Number of hours:  No data recorded  Nutritional Assessment (For OBS and FBC admissions only) Has the patient had a weight loss or gain of 10 pounds or more in the last 3 months?: No Has the patient had a decrease in food intake/or appetite?: No Does the patient have dental problems?: No Does the patient have eating habits or behaviors that may be indicators of an eating disorder including binging or inducing vomiting?: No Has the patient recently lost weight without trying?: 0 Has the patient been eating poorly because of a decreased appetite?: 0 Malnutrition Screening Tool Score: 0   Physical Exam: Physical Exam Vitals and nursing note reviewed.  HENT:     Head: Normocephalic.  Cardiovascular:     Rate and Rhythm: Normal rate and regular rhythm.  Skin:    General: Skin is dry.  Neurological:     Mental Status: She is alert and oriented to person, place, and time.  Psychiatric:        Attention and Perception: She is inattentive.        Mood and Affect: Mood is anxious. Affect is labile.        Speech: Speech is rapid and pressured.        Behavior: Behavior is agitated.        Thought Content: Thought content is paranoid and delusional.  Cognition and Memory: Memory is impaired.        Judgment: Judgment is impulsive.   Review of Systems  HENT: Negative.    Eyes: Negative.   Skin: Negative.    Neurological: Negative.   Psychiatric/Behavioral:  Negative for depression and suicidal ideas. The patient is nervous/anxious.   All other systems reviewed and are negative. Blood pressure 124/80, temperature 98.7 F (37.1 C), temperature source Oral, resp. rate 18, height '5\' 4"'$  (1.626 m), weight 169 lb (76.7 kg), SpO2 100 %, unknown if currently breastfeeding. Body mass index is 29.01 kg/m.  Musculoskeletal: Strength & Muscle Tone: within normal limits Gait & Station: normal Patient leans: N/A   [place] MSE Discharge Disposition for Follow up and Recommendations: Based on my evaluation the patient does not appear to have an emergency medical condition and can be discharged with resources and follow up care in outpatient services for Medication Management -NP initiated involuntary commitment  Derrill Center, NP 09/19/2020, 3:19 PM

## 2020-09-19 NOTE — Discharge Instructions (Signed)
Take all medications as prescribed. Keep all follow-up appointments as scheduled.  Do not consume alcohol or use illegal drugs while on prescription medications. Report any adverse effects from your medications to your primary care provider promptly.  In the event of recurrent symptoms or worsening symptoms, call 911, a crisis hotline, or go to the nearest emergency department for evaluation.   

## 2020-09-19 NOTE — Progress Notes (Signed)
Patient on unit, standing in hallway, talking to TTS. Staff went to go out door at end of hallway.  Patient turned and ran towards door yelling that she had to leave.  Staff stepped in between unit door and patient.  Patient clawing at staff and door.  Door closed.  Patient stepped back and began yelling at staff.  Stated she was upset because she had not been allowed to leave.  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  Staff attempting to verbally deescalate patient and patient calmed somewhat. NP opened door and directed patient through door.

## 2020-09-19 NOTE — Progress Notes (Signed)
Patient discharged by provider to caregiver.  See provider discharge note.

## 2020-09-20 ENCOUNTER — Emergency Department (HOSPITAL_COMMUNITY)
Admission: EM | Admit: 2020-09-20 | Discharge: 2020-09-27 | Disposition: A | Discharge status: Other Acute Inpatient Hospital | Payer: Medicare Other | Attending: Emergency Medicine | Admitting: Emergency Medicine

## 2020-09-20 ENCOUNTER — Ambulatory Visit (HOSPITAL_COMMUNITY)
Admission: EM | Admit: 2020-09-20 | Discharge: 2020-09-20 | Disposition: A | Discharge status: Other Acute Inpatient Hospital | Payer: Medicare Other | Attending: Psychiatry | Admitting: Psychiatry

## 2020-09-20 ENCOUNTER — Other Ambulatory Visit: Payer: Self-pay

## 2020-09-20 ENCOUNTER — Encounter (HOSPITAL_COMMUNITY): Payer: Self-pay | Admitting: Emergency Medicine

## 2020-09-20 DIAGNOSIS — G4700 Insomnia, unspecified: Secondary | ICD-10-CM | POA: Insufficient documentation

## 2020-09-20 DIAGNOSIS — F250 Schizoaffective disorder, bipolar type: Secondary | ICD-10-CM

## 2020-09-20 DIAGNOSIS — Z79899 Other long term (current) drug therapy: Secondary | ICD-10-CM | POA: Diagnosis not present

## 2020-09-20 DIAGNOSIS — R45851 Suicidal ideations: Secondary | ICD-10-CM | POA: Insufficient documentation

## 2020-09-20 DIAGNOSIS — J45909 Unspecified asthma, uncomplicated: Secondary | ICD-10-CM | POA: Insufficient documentation

## 2020-09-20 DIAGNOSIS — Z20822 Contact with and (suspected) exposure to covid-19: Secondary | ICD-10-CM | POA: Diagnosis not present

## 2020-09-20 LAB — CBC WITH DIFFERENTIAL/PLATELET
Abs Immature Granulocytes: 0.03 10*3/uL (ref 0.00–0.07)
Basophils Absolute: 0 10*3/uL (ref 0.0–0.1)
Basophils Relative: 0 %
Eosinophils Absolute: 0 10*3/uL (ref 0.0–0.5)
Eosinophils Relative: 0 %
HCT: 36.5 % (ref 36.0–46.0)
Hemoglobin: 12.4 g/dL (ref 12.0–15.0)
Immature Granulocytes: 0 %
Lymphocytes Relative: 9 %
Lymphs Abs: 0.9 10*3/uL (ref 0.7–4.0)
MCH: 31.9 pg (ref 26.0–34.0)
MCHC: 34 g/dL (ref 30.0–36.0)
MCV: 93.8 fL (ref 80.0–100.0)
Monocytes Absolute: 0.8 10*3/uL (ref 0.1–1.0)
Monocytes Relative: 8 %
Neutro Abs: 8 10*3/uL — ABNORMAL HIGH (ref 1.7–7.7)
Neutrophils Relative %: 83 %
Platelets: 195 10*3/uL (ref 150–400)
RBC: 3.89 MIL/uL (ref 3.87–5.11)
RDW: 13.5 % (ref 11.5–15.5)
WBC: 9.8 10*3/uL (ref 4.0–10.5)
nRBC: 0 % (ref 0.0–0.2)

## 2020-09-20 LAB — COMPREHENSIVE METABOLIC PANEL
ALT: 15 U/L (ref 0–44)
AST: 22 U/L (ref 15–41)
Albumin: 4.4 g/dL (ref 3.5–5.0)
Alkaline Phosphatase: 43 U/L (ref 38–126)
Anion gap: 12 (ref 5–15)
BUN: 16 mg/dL (ref 6–20)
CO2: 21 mmol/L — ABNORMAL LOW (ref 22–32)
Calcium: 9.1 mg/dL (ref 8.9–10.3)
Chloride: 105 mmol/L (ref 98–111)
Creatinine, Ser: 1.04 mg/dL — ABNORMAL HIGH (ref 0.44–1.00)
GFR, Estimated: >60 mL/min (ref >60)
Glucose, Bld: 96 mg/dL (ref 70–99)
Potassium: 3.6 mmol/L (ref 3.5–5.1)
Sodium: 138 mmol/L (ref 135–145)
Total Bilirubin: 1.2 mg/dL (ref 0.3–1.2)
Total Protein: 8.4 g/dL — ABNORMAL HIGH (ref 6.5–8.1)

## 2020-09-20 LAB — SALICYLATE LEVEL: Salicylate Lvl: <7 mg/dL — ABNORMAL LOW (ref 7.0–30.0)

## 2020-09-20 LAB — ACETAMINOPHEN LEVEL: Acetaminophen (Tylenol), Serum: <10 ug/mL — ABNORMAL LOW (ref 10–30)

## 2020-09-20 LAB — I-STAT BETA HCG BLOOD, ED (MC, WL, AP ONLY): I-stat hCG, quantitative: <5 m[IU]/mL (ref <5)

## 2020-09-20 LAB — ETHANOL: Alcohol, Ethyl (B): <10 mg/dL (ref <10)

## 2020-09-20 MED ORDER — ACETAMINOPHEN 325 MG PO TABS
650.0000 mg | ORAL_TABLET | Freq: Once | ORAL | Status: AC
  Administered 2020-09-20: 650 mg via ORAL
  Filled 2020-09-20: qty 2

## 2020-09-20 NOTE — ED Notes (Signed)
NT attempted to get EKG on pt. Pt kept pulling off cords and wasn't being still or cooperative enough to get and EKG. RN notified.

## 2020-09-20 NOTE — ED Triage Notes (Signed)
Pt BIB [facility] from [facility] under IVC, hx schizophrenia. Pt presents with soft, disorganized speech.

## 2020-09-20 NOTE — BH Assessment (Addendum)
[name] [name], Urgent, MR #[id number]; 40 years old presents this date with her caretaker, Leticia Clas, [phone].  Pt presents psychotic and hearing voices.  Pt has a history of schizophrenia.  Pt caretaker states no SI, or HI.  Pt is currently responding to internal stimuli or experiencing delusional thought content.  Pt was seen by Dr. Dwyane Dee, recommended inpatient; also, was IVC.  Pt was brought in on 09/19/20.  MSE was signed by caretaker [employer].

## 2020-09-20 NOTE — ED Provider Notes (Signed)
[place] EMERGENCY DEPARTMENT Provider Note   CSN: [id number] Arrival date & time: 09/20/20  1529     History Chief Complaint  Patient presents with   IVC    [name] [name] is a 40 y.o. female.  With a past medical history of schizoaffective disorder, bipolar type who is brought to the emergency department by [facility] Department under involuntary commitment by Dr. Hampton Abbot.  According to her nerve root the patient has not been sleeping for several days and is only responding to internal stimuli.  She was recently discharged from an inpatient psychiatric facility in [place] and [facility] and prescribed Zyprexa and Zydis however patient has continued to refuse take medications.  She does not have the capacity to make these decisions at this time.  IVC paperwork upheld with first examination  HPI     Past Medical History:  Diagnosis Date   Bipolar affective disorder, currently manic, mild ([place])    Diabetes mellitus without complication ([place])    Schizophrenia ([place])     Patient Active Problem List   Diagnosis Date Noted   Psychosis ([place]) 07/13/2019   Labor without complication [id number]   Indication for care in labor or delivery 07/10/2019   Third trimester pregnancy 07/06/2019   [redacted] weeks gestation of pregnancy    AMA (advanced maternal age) multigravida 35+, third trimester 07/04/2019   Supervision of high risk pregnancy, antepartum 07/04/2019   No prenatal care in current pregnancy in third trimester 07/04/2019   Obesity in pregnancy 07/04/2019   BMI 30s 07/04/2019   History of cesarean delivery 07/04/2019   Short interval between pregnancies affecting pregnancy in third trimester, antepartum 07/04/2019   Adjustment disorder with mixed disturbance of emotions and conduct 07/11/2017   Acute psychosis ([place]) 12/21/2015   Insomnia    Anxiety state    Overactive bladder    Diabetes mellitus ([place]) 02/08/2015    Schizoaffective disorder, bipolar type ([place]) 01/28/2015   Non compliance w medication regimen     Past Surgical History:  Procedure Laterality Date   CESAREAN SECTION  04/2018   WISDOM TOOTH EXTRACTION       OB History     Gravida  2   Para  2   Term  2   Preterm  0   AB  0   Living  2      SAB  0   IAB  0   Ectopic  0   Multiple      Live Births  2           Family History  Problem Relation Age of Onset   Drug abuse Maternal Uncle     Social History   Tobacco Use   Smoking status: Never   Smokeless tobacco: Never  Vaping Use   Vaping Use: Never used  Substance Use Topics   Alcohol use: Not Currently   Drug use: Not Currently    Home Medications Prior to Admission medications   Medication Sig Start Date End Date Taking? Authorizing Provider  divalproex (DEPAKOTE ER) 250 MG 24 hr tablet Take 1 tablet (250 mg total) by mouth every morning. 07/20/19   Connye Burkitt, NP  divalproex (DEPAKOTE ER) 500 MG 24 hr tablet Take 1 tablet (500 mg total) by mouth at bedtime. 07/19/19   Connye Burkitt, NP  gabapentin (NEURONTIN) 100 MG capsule Take 1 capsule (100 mg total) by mouth 2 (two) times daily for 20 doses. 09/29/19  10/09/19  Curatolo, Adam, DO  hydrocerin (EUCERIN) CREA Apply 1 application topically 2 (two) times daily. 07/19/19   Connye Burkitt, NP  metoprolol succinate (TOPROL-XL) 25 MG 24 hr tablet Take 1 tablet (25 mg total) by mouth daily. 07/20/19   Connye Burkitt, NP  OLANZapine (ZYPREXA) 15 MG tablet Take 1 tablet (15 mg total) by mouth daily. 09/19/20   Derrill Center, NP  OLANZapine zydis (ZYPREXA) 15 MG disintegrating tablet Take 1 tablet (15 mg total) by mouth once for 1 dose. 09/19/20 09/19/20  Derrill Center, NP  Prenatal Vit-Fe Fumarate-FA (PRENATAL MULTIVITAMIN) TABS tablet Take 1 tablet by mouth daily at 12 noon. 07/06/19   Clapacs, Madie Reno, MD  spironolactone (ALDACTONE) 25 MG tablet Take 0.5 tablets (12.5 mg total) by mouth daily. 07/20/19    Connye Burkitt, NP    Allergies    Quetiapine, Gabapentin, Lorazepam, Risperidone, Valproic acid, Aripiprazole, Fluphenazine, Haloperidol, Trazodone, and Ziprasidone hcl  Review of Systems   Review of Systems  Unable to perform ROS: Psychiatric disorder   Physical Exam Updated Vital Signs BP (!) 147/102 (BP Location: Left Arm)   Pulse (!) 116   Temp (!) 100.6 F (38.1 C) (Oral)   Resp 18   SpO2 99%   Physical Exam Vitals and nursing note reviewed.  Constitutional:      General: She is not in acute distress.    Appearance: She is well-developed. She is not diaphoretic.  HENT:     Head: Normocephalic and atraumatic.     Right Ear: External ear normal.     Left Ear: External ear normal.     Nose: Nose normal.     Mouth/Throat:     Mouth: Mucous membranes are moist.  Eyes:     General: No scleral icterus.    Conjunctiva/sclera: Conjunctivae normal.  Cardiovascular:     Rate and Rhythm: Normal rate and regular rhythm.     Heart sounds: Normal heart sounds. No murmur heard.   No friction rub. No gallop.  Pulmonary:     Effort: Pulmonary effort is normal. No respiratory distress.     Breath sounds: Normal breath sounds.  Abdominal:     General: Bowel sounds are normal. There is no distension.     Palpations: Abdomen is soft. There is no mass.     Tenderness: There is no abdominal tenderness. There is no guarding.  Musculoskeletal:     Cervical back: Normal range of motion.  Skin:    General: Skin is warm and dry.  Neurological:     Mental Status: She is alert.  Psychiatric:        Attention and Perception: She is inattentive. She perceives auditory and visual hallucinations.        Mood and Affect: Affect is inappropriate.        Speech: She is noncommunicative.        Behavior: Behavior is withdrawn. Behavior is not agitated.     Comments: Patient is constantly speaking however only speaks to internal stimuli.  When addressed the patient stares blankly with flat  affect then returns to speaking with internal stimuli.  She is dissociated with reality    ED Results / Procedures / Treatments   Labs (all labs ordered are listed, but only abnormal results are displayed) Labs Reviewed  COMPREHENSIVE METABOLIC PANEL - Abnormal; Notable for the following components:      Result Value   CO2 21 (*)    Creatinine, Ser 1.04 (*)  Total Protein 8.4 (*)    All other components within normal limits  CBC WITH DIFFERENTIAL/PLATELET - Abnormal; Notable for the following components:   Neutro Abs 8.0 (*)    All other components within normal limits  SALICYLATE LEVEL - Abnormal; Notable for the following components:   Salicylate Lvl [id number] (*)    All other components within normal limits  ACETAMINOPHEN LEVEL - Abnormal; Notable for the following components:   Acetaminophen (Tylenol), Serum <10 (*)    All other components within normal limits  RESP PANEL BY RT-PCR (FLU A&B, COVID) ARPGX2  ETHANOL  URINALYSIS, ROUTINE W REFLEX MICROSCOPIC  RAPID URINE DRUG SCREEN, HOSP PERFORMED  I-STAT BETA HCG BLOOD, ED ([facility], [facility], [facility] ONLY)    EKG None  Radiology No results found.  Procedures Procedures   Medications Ordered in ED Medications  acetaminophen (TYLENOL) tablet 650 mg (650 mg Oral Given 09/20/20 1548)    ED Course  I have reviewed the triage vital signs and the nursing notes.  Pertinent labs & imaging results that were available during my care of the patient were reviewed by me and considered in my medical decision making (see chart for details).    MDM Rules/Calculators/A&P                           Patient here with psychosis under involuntary commitment.  Patient is medically clear. Final Clinical Impression(s) / ED Diagnoses Final diagnoses:  None    Rx / DC Orders ED Discharge Orders     None        Margarita Mail, PA-C 09/21/20 0919    Deno Etienne, DO 09/21/20 1916

## 2020-09-20 NOTE — ED Notes (Signed)
Pt's belongings in triage. She only has a skirt and a shirt, it was put into a paper bag by [facility] officer and whole bag was placed into belongings bag.

## 2020-09-20 NOTE — ED Provider Notes (Signed)
Emergency Medicine Provider Triage Evaluation Note  [name] [name] , a 40 y.o. female  was evaluated in triage.  Pt here for psych eval. Here with [facility] under ivc. Sent from [facility].  Review of Systems  Positive: Unable to obtain due to psychiatric disorder Negative: Unable to obtain due to psychiatric disorder  Physical Exam  There were no vitals taken for this visit. Gen:   Awake, no distress   Resp:  Normal effort  MSK:   Moves extremities without difficulty  Other:  Tangential, easily distracted  Medical Decision Making  Medically screening exam initiated at 3:29 PM.  Appropriate orders placed.  Tkai Tiley was informed that the remainder of the evaluation will be completed by another provider, this initial triage assessment does not replace that evaluation, and the importance of remaining in the ED until their evaluation is complete.     Rodney Booze, PA-C 09/20/20 1534    Lorelle Gibbs, DO 09/20/20 1640

## 2020-09-20 NOTE — Discharge Instructions (Signed)
Transfer to ED

## 2020-09-20 NOTE — ED Provider Notes (Addendum)
Behavioral Health Urgent Care Medical Screening Exam  Patient Name: [name] [name] MRN: [id number] Date of Evaluation: 09/20/20 Chief Complaint: med decisions, is manic and psychotic Diagnosis:  Final diagnoses:  None    History of Present illness: [name] [name] is a 40 y.o. female. Patient is a 40 year old female diagnosed with schizoaffective disorder, bipolar type who was brought in by her caregiver.  Patient was noted to be agitated, has not slept for a few days, is responding to internal stimuli, has rambling speech with loosening of association.  Patient was recently discharged a few weeks ago from [place], was seen yesterday for an evaluation and prescribed Zyprexa Zydis to help her hypomania, mania.  IVC was also initiated as patient refused to stay but the papers did not get service yesterday.  The papers have been refaxed to the magistrate so patient can be served with IVC as she does not have mental capacity to make any informed decision.  Psychiatric Specialty Exam  Presentation  General Appearance:Bizarre  Eye Contact:Fleeting  Speech:Pressured  Speech Volume:Increased  Handedness:Right   Mood and Affect  Mood:Irritable; Labile  Affect:Inappropriate; Labile   Thought Process  Thought Processes:Disorganized  Descriptions of Associations:Loose  Orientation:None  Thought Content:Delusions; Illogical; Paranoid Ideation  Diagnosis of Schizophrenia or Schizoaffective disorder in past: Yes  Duration of Psychotic Symptoms: Greater than six months  Hallucinations:Auditory  Ideas of Reference:Delusions  Suicidal Thoughts:No  Homicidal Thoughts:No   Sensorium  Memory:Immediate Poor; Remote Poor; Recent Poor  Judgment:Impaired  Insight:None   Executive Functions  Concentration:Poor  Attention Span:Poor  Recall:Poor  Fund of Knowledge:Poor  Language:Poor   Psychomotor Activity  Psychomotor Activity:Restlessness   Assets   Assets:Social Support; Housing; Financial Resources/Insurance   Sleep  Sleep:Poor  Number of hours:  No data recorded  Nutritional Assessment (For OBS and FBC admissions only) Has the patient had a weight loss or gain of 10 pounds or more in the last 3 months?: No Has the patient had a decrease in food intake/or appetite?: No Does the patient have dental problems?: No Does the patient have eating habits or behaviors that may be indicators of an eating disorder including binging or inducing vomiting?: No Has the patient recently lost weight without trying?: 0 Has the patient been eating poorly because of a decreased appetite?: 0 Malnutrition Screening Tool Score: 0    Physical Exam: Physical Exam Constitutional:      Appearance: Normal appearance.  HENT:     Head: Normocephalic and atraumatic.     Nose: Nose normal.     Mouth/Throat:     Mouth: Mucous membranes are dry.  Eyes:     Extraocular Movements: Extraocular movements intact.     Pupils: Pupils are equal, round, and reactive to light.  Cardiovascular:     Rate and Rhythm: Normal rate and regular rhythm.  Pulmonary:     Effort: Pulmonary effort is normal.     Breath sounds: Normal breath sounds.  Musculoskeletal:        General: Normal range of motion.     Cervical back: Normal range of motion and neck supple.  Skin:    General: Skin is warm and dry.  Neurological:     Mental Status: She is alert. She is disoriented.   Review of Systems  Constitutional: Negative.  Negative for fever and malaise/fatigue.  HENT: Negative.  Negative for congestion, ear discharge and sore throat.   Eyes: Negative.  Negative for blurred vision, double vision, discharge and redness.  Respiratory: Negative.  Negative for cough, shortness of breath and wheezing.   Cardiovascular: Negative.  Negative for chest pain and palpitations.  Gastrointestinal:  Negative for abdominal pain, heartburn and nausea.  Musculoskeletal:  Negative for  myalgias.  Neurological:  Negative for dizziness, seizures and loss of consciousness.  Psychiatric/Behavioral:  Positive for hallucinations. Negative for depression, substance abuse and suicidal ideas. The patient has insomnia. The patient is not nervous/anxious.   Blood pressure (!) 150/80, pulse 100, temperature 98.1 F (36.7 C), temperature source Oral, resp. rate 18, SpO2 99 %, unknown if currently breastfeeding. There is no height or weight on file to calculate BMI.  Musculoskeletal: Strength & Muscle Tone: within normal limits Gait & Station: normal Patient leans: N/A   [facility] MSE Discharge Disposition for Follow up and Recommendations: Based on my evaluation, patient needs inpatient psychiatric admission but is medically stable.  Once IVC paperwork is submitted, patient will have to be transferred to [facility] ED as patient is manic, delusional and psychotic and refuses voluntary treatment Would recommend patient to be started on Zyprexa 15 mg QHS and depakote 500 mg BID Hampton Abbot, MD 09/20/2020, 2:41 PM

## 2020-09-21 DIAGNOSIS — F250 Schizoaffective disorder, bipolar type: Secondary | ICD-10-CM | POA: Diagnosis not present

## 2020-09-21 LAB — RAPID URINE DRUG SCREEN, HOSP PERFORMED
Amphetamines: NOT DETECTED
Barbiturates: NOT DETECTED
Benzodiazepines: NOT DETECTED
Cocaine: NOT DETECTED
Opiates: NOT DETECTED
Tetrahydrocannabinol: NOT DETECTED

## 2020-09-21 LAB — URINALYSIS, ROUTINE W REFLEX MICROSCOPIC
Bilirubin Urine: NEGATIVE
Glucose, UA: NEGATIVE mg/dL
Hgb urine dipstick: NEGATIVE
Ketones, ur: 80 mg/dL — AB
Nitrite: NEGATIVE
Protein, ur: 30 mg/dL — AB
Specific Gravity, Urine: 1.027 (ref 1.005–1.030)
pH: 5 (ref 5.0–8.0)

## 2020-09-21 LAB — RESP PANEL BY RT-PCR (FLU A&B, COVID) ARPGX2
Influenza A by PCR: NEGATIVE
Influenza B by PCR: NEGATIVE
SARS Coronavirus 2 by RT PCR: NEGATIVE

## 2020-09-21 MED ORDER — ZIPRASIDONE MESYLATE 20 MG IM SOLR
INTRAMUSCULAR | Status: AC
  Administered 2020-09-21: 20 mg via INTRAMUSCULAR
  Filled 2020-09-21: qty 20

## 2020-09-21 MED ORDER — CEPHALEXIN 250 MG PO CAPS
500.0000 mg | ORAL_CAPSULE | Freq: Once | ORAL | Status: AC
  Administered 2020-09-22: 500 mg via ORAL
  Filled 2020-09-21 (×2): qty 2

## 2020-09-21 MED ORDER — STERILE WATER FOR INJECTION IJ SOLN
INTRAMUSCULAR | Status: AC
  Filled 2020-09-21: qty 10

## 2020-09-21 MED ORDER — DROPERIDOL 2.5 MG/ML IJ SOLN
2.5000 mg | Freq: Once | INTRAMUSCULAR | Status: AC
  Administered 2020-09-21: 2.5 mg via INTRAMUSCULAR
  Filled 2020-09-21: qty 2

## 2020-09-21 MED ORDER — ZIPRASIDONE MESYLATE 20 MG IM SOLR: 20.0000 mg | Freq: Once | INTRAMUSCULAR | Status: AC

## 2020-09-21 MED ORDER — ACETAMINOPHEN 325 MG PO TABS
650.0000 mg | ORAL_TABLET | Freq: Once | ORAL | Status: DC
  Filled 2020-09-21: qty 2

## 2020-09-21 MED ORDER — HYDROXYZINE HCL 25 MG PO TABS
25.0000 mg | ORAL_TABLET | Freq: Once | ORAL | Status: AC
  Administered 2020-09-21: 25 mg via ORAL
  Filled 2020-09-21: qty 1

## 2020-09-21 MED ORDER — OLANZAPINE 5 MG PO TBDP
15.0000 mg | ORAL_TABLET | Freq: Every day | ORAL | Status: DC
  Administered 2020-09-22: 15 mg via ORAL
  Administered 2020-09-23: 10 mg via ORAL
  Administered 2020-09-25 – 2020-09-26 (×3): 15 mg via ORAL
  Filled 2020-09-21 (×6): qty 3

## 2020-09-21 NOTE — ED Notes (Signed)
Current sitter with patient is leaving at shift change, per sitter there is no one coming after her. This RN spoke to charge RN, no other sitters in the department to come and sit with patient.

## 2020-09-21 NOTE — ED Notes (Signed)
Bladder scanned pt and showed 495m.

## 2020-09-21 NOTE — Progress Notes (Signed)
Patient has been faxed out due to no beds available at [facility]. Patient meets inpatient criteria per Dr. Dwyane Dee. Patient referred to the following facilities:  [facility]  [address] [place] [address] [place]  [address] [place] [address] [phone] [place]  [address], [place] [address] [phone] [place]  [address] [place] [address] [phone] [place]  [address] [place] [address] [phone] [facility]  [address] [place] [address] [phone] [facility]  [place], [place] [address] [phone]  [facility]  [address] [place], [place] [address] [phone]  [facility]  [phone] [address] [place] [address] [phone] [facility]  [address] [place] [address] [phone] [facility]  [address], [place] [address] [phone]  [facility]  [address], [place] [address] [place]  [facility]  [address] [place] [address] [phone]    CSW will continue to monitor disposition.    Mariea Clonts, MSW, LCSW-A  10:00 AM 09/21/2020

## 2020-09-21 NOTE — ED Notes (Signed)
Pt up out of bed again, rambling, appears to be talking to people who are not there. Security called to bedside. Pt assisted back to bed and medicine given

## 2020-09-21 NOTE — ED Notes (Signed)
Pt given tylenol and keflex, however, pt rolled tylenol around on the table and refused to take it.

## 2020-09-21 NOTE — ED Notes (Signed)
Pt up walking in the middle of the hallway - rambling about topics that do not make sense - pt able to tell us her name. Pt redirectable at this time. MD [place] notified. When this RN came back to pt bed pt now in bed but still rambling.

## 2020-09-21 NOTE — ED Notes (Signed)
Pt continues to have voluntary, jerking movements while talking to herself. Pt also makes certain gestures. Does not make eye contact at all. Pt is not redirectable. Pt started standing and walking in the hallway while making jerking movements and gestures, talking to herself. Constantly talking since this AM. Pt was given IM Geodon '20mg'$  at this time with security at bedside for safety purposes. Pt has no agitation observed. However, pt is not redirectable at this time. Will continue to monitor. Safety precautions maintained. 1:1 sitter continued.

## 2020-09-21 NOTE — ED Notes (Signed)
Sitter now at bedside.

## 2020-09-22 DIAGNOSIS — F250 Schizoaffective disorder, bipolar type: Secondary | ICD-10-CM | POA: Diagnosis not present

## 2020-09-22 MED ORDER — DIPHENHYDRAMINE HCL 50 MG/ML IJ SOLN
25.0000 mg | Freq: Once | INTRAMUSCULAR | Status: AC
  Administered 2020-09-22: 25 mg via INTRAMUSCULAR
  Filled 2020-09-22: qty 1

## 2020-09-22 MED ORDER — LORAZEPAM 2 MG/ML IJ SOLN
2.0000 mg | Freq: Once | INTRAMUSCULAR | Status: AC
  Administered 2020-09-22: 2 mg via INTRAMUSCULAR
  Filled 2020-09-22: qty 1

## 2020-09-22 NOTE — ED Notes (Signed)
Received verbal report from [place] at this time

## 2020-09-22 NOTE — ED Notes (Signed)
Pt now standing out of bed, rambling, twitching. Redirected back to bed by this RN and sitter. This RN spoke to Dr. Leonette Monarch who is okay with pt getting missed dose of Zyprexa now if pt will take it

## 2020-09-22 NOTE — ED Notes (Signed)
Pt singing out loud and yelling. Pt making gestures. Pt having to be directed multiple times to get back into bed. MD [place] made aware.

## 2020-09-22 NOTE — ED Notes (Addendum)
A/A/O, showered with Midwife present. Is calm and cooperative, appropriate questions to RN.

## 2020-09-22 NOTE — Consult Note (Signed)
  [name] [name] is an 40 year old female who presents psychotic and hearing voices.  Patient has a history of schizophrenia.  Patient was very disruptive yesterday throughout the day and night, and she was observed to be responding to internal stimuli, external stimuli, and hearing voices.  Her speech yesterday was slurred, and she was noted to be rambling throughout.   Attempted to reassess patient this morning, however she is asleep.  She was sedated, and had recently received medications.  Writer did not attempt to wake as patient has not been sleep in over 24 hours.  Will attempt to reassess.  -Patient continues to meet inpatient psychiatric criteria.  At present there are no current beds available at [facility].  Will need to be referred out of system.

## 2020-09-22 NOTE — ED Notes (Signed)
Pt sitting in bed with gown over head, fidgeting, pt states that she is "trying to block my ring from everybody" - sitter at bedside. Pt remains in bed

## 2020-09-22 NOTE — ED Notes (Signed)
Will obtain vital signs once pt is awake.

## 2020-09-22 NOTE — ED Notes (Signed)
Remaining droperidol wasted with Humphrey Rolls RN in sharps

## 2020-09-22 NOTE — ED Notes (Signed)
Meds administered by this RN and Humphrey Rolls RN. At Dr. Wilma Flavin request pt given Keflex at this time as she did not get it earlier.

## 2020-09-22 NOTE — ED Notes (Signed)
Pt took oral zyprexa with assistance by this RN. Pt continues to jump out of bed, kissing ground, speaking in a different language, rubbing face on bed. Pt redirected back into bed and given warm blankets. Sitter remains at bedside with patient

## 2020-09-23 DIAGNOSIS — F250 Schizoaffective disorder, bipolar type: Secondary | ICD-10-CM | POA: Diagnosis not present

## 2020-09-23 MED ORDER — DIPHENHYDRAMINE HCL 50 MG/ML IJ SOLN
50.0000 mg | INTRAMUSCULAR | Status: AC | PRN
  Administered 2020-09-23: 50 mg via INTRAMUSCULAR
  Filled 2020-09-23: qty 1

## 2020-09-23 MED ORDER — OLANZAPINE 10 MG IM SOLR
10.0000 mg | Freq: Every day | INTRAMUSCULAR | Status: DC | PRN
  Administered 2020-09-26: 10 mg via INTRAMUSCULAR
  Filled 2020-09-23: qty 10

## 2020-09-23 MED ORDER — LORAZEPAM 2 MG/ML IJ SOLN
2.0000 mg | Freq: Four times a day (QID) | INTRAMUSCULAR | Status: DC | PRN
  Administered 2020-09-23 – 2020-09-26 (×3): 2 mg via INTRAMUSCULAR
  Filled 2020-09-23 (×4): qty 1

## 2020-09-23 NOTE — Progress Notes (Signed)
CSW followed up with [facility], who advised that they currently have any beds.  Glennie Isle, MSW, [place], LCAS-A Phone: [phone] Disposition/TOC
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
## 2020-09-23 NOTE — ED Notes (Signed)
TTS in progress 
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
## 2020-09-23 NOTE — ED Notes (Signed)
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 States "my doctor told me that I can go home now, and all my medicines are sent to my house and I have to go home to take meds. I take only 1 though". 1:1 sitter at bedside.
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
## 2020-09-23 NOTE — Progress Notes (Signed)
Per [name] [name], patient meets criteria for inpatient treatment. There are no available or appropriate beds at [facility] today. CSW faxed referrals to the following facilities for review:  [address] [place] [address] [phone] --  [place] [address] [place] [address] [phone] --  [place] [address], [place] [address] [phone] --  [facility]  Pending - Request Sent N/A [place] [place] [address] [phone] --  [place] [address] [place] [address] [phone] --  [facility]  Pending - Request Sent N/A [address] [place] [address] [phone] --  [facility]  Pending - Request Sent N/A [place], [place] [address] [phone] --  [facility]  Pending - Request Sent N/A [address] [place] [address] [phone] --  [place] [phone] [address] [place] [address] [place] --  [facility]  Pending - Request Sent N/A [address] [place] [address] [phone] --  [facility]  Pending - Request Sent N/A [address] [place] [address] [phone] --  [place] N/A [address] [place] [address] [phone] --  [facility]  Pending - Request Sent N/A [address], [place] [address] [phone] --  [facility]  Pending - No Request Sent N/A  [address], [place] [address] [phone] --  [place] No Request Sent N/A [address], [place] [address] [phone] --  [facility]  Pending - No Request Sent N/A [address] [place] [address] [phone] --  [place]  Pending - No Request Sent N/A [address], [place] [address] [phone] --  [facility]  Pending - No Request Sent N/A [address] [place], [place] [address] [phone] --  [facility]  Pending - No Request [facility] [address] [place] [address] [phone] --  [facility]  Pending - No Request Sent N/A [address] [place] [address] [phone] [facility]  Pending - No Request Sent N/A [place], [place] [address] [phone] --  [facility]  Pending - No Request Sent N/A [address] [place] [address] [phone] --  [facility]  Pending - No Request Sent N/A [address], [place] [address] [phone] --  [place]  Pending - No Request Sent N/A [place]., [place] [address] [phone] --   TTS will continue to seek bed placement.  Glennie Isle, MSW, [place], LCAS-A Phone: [phone] Disposition/TOC

## 2020-09-23 NOTE — ED Notes (Signed)
Patient requesting a hysterectomy, birth control, and an iron infusion. This RN informed the patient that we do not provide those services in the emergency department. Patient requesting a doctor to come draw her blood. Patient informed that her blood was already drawn when she first arrived. Patient asking for a copy of her blood results. Patient referred to medical records and reassured that there was nothing concerning in her blood work. Patient requesting a ride home. Informed patient that she is under IVC and we are awaiting placement. 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

## 2020-09-23 NOTE — ED Notes (Addendum)
Patient asking for her medications to be sent again. This RN asked the patient if she recalls our previous conversation. Patient states, "I have short-term memory loss." Reminded patient of previous conversation.

## 2020-09-23 NOTE — ED Notes (Addendum)
Patient refusing vital signs at this time. Patient stated, "I don't want to participate in that." Patient continues to mention getting her medications filled at the pharmacy.
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
## 2020-09-23 NOTE — ED Notes (Signed)
Patient refusing vitals per previous staff. Patient too agitated to obtain at this time.
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
## 2020-09-23 NOTE — ED Notes (Signed)
Pt refusing vitals at this time. Will attempt again later.

## 2020-09-23 NOTE — ED Notes (Addendum)
PT refused to have her vitals taken. PT stated that  she thinks it is very rude to even come in and ask. PT was observed using a tooth brush on her face, when asked what she was doing , PT stated that she was exfoliating her face close the door!  The tooth brushes have been removed for PT's safety by this NT.

## 2020-09-23 NOTE — ED Notes (Signed)
Patient pulled the code blue button; pt in hallway the manic behaviors and refusing staff to take vitals; [place] NP notified for help with medications due to large allergy list; Patient will only take partial of PO med ordered; [employer] in unit for safety-Monique,RN

## 2020-09-23 NOTE — ED Notes (Signed)
Sitter at bedside. Pt awake and requesting a wet wash cloth to wash her face. One provided at this time

## 2020-09-23 NOTE — ED Notes (Signed)
Patient showering at this time. 
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
## 2020-09-24 ENCOUNTER — Encounter (HOSPITAL_COMMUNITY): Payer: Self-pay | Admitting: Registered Nurse

## 2020-09-24 DIAGNOSIS — F250 Schizoaffective disorder, bipolar type: Secondary | ICD-10-CM

## 2020-09-24 DIAGNOSIS — G4700 Insomnia, unspecified: Secondary | ICD-10-CM | POA: Diagnosis not present

## 2020-09-24 LAB — VALPROIC ACID LEVEL: Valproic Acid Lvl: <10 ug/mL — ABNORMAL LOW (ref 50.0–100.0)

## 2020-09-24 MED ORDER — DIVALPROEX SODIUM ER 250 MG PO TB24
250.0000 mg | ORAL_TABLET | Freq: Every morning | ORAL | Status: DC
  Administered 2020-09-26 – 2020-09-27 (×2): 250 mg via ORAL
  Filled 2020-09-24 (×2): qty 1

## 2020-09-24 MED ORDER — DIVALPROEX SODIUM ER 250 MG PO TB24
250.0000 mg | ORAL_TABLET | Freq: Every day | ORAL | Status: DC
  Administered 2020-09-25 – 2020-09-26 (×2): 250 mg via ORAL
  Filled 2020-09-24: qty 1

## 2020-09-24 NOTE — ED Notes (Signed)
Pt sleeping, will wait till AM meal to check vitals.

## 2020-09-24 NOTE — ED Notes (Signed)
PT at desk making a Phone call.

## 2020-09-24 NOTE — ED Notes (Signed)
Reviewed Pt insuline order with DR Roslynn Amble . New orders placed ,Diabetic RN order placed.

## 2020-09-24 NOTE — ED Notes (Signed)
Pt at desk to make a phone call.

## 2020-09-24 NOTE — ED Notes (Signed)
ED Provider at bedside. PT in BR

## 2020-09-24 NOTE — ED Notes (Signed)
Faxed EKG's to [facility] . First copies were not received.

## 2020-09-24 NOTE — ED Notes (Signed)
Pt at desk making phone call.   

## 2020-09-24 NOTE — Progress Notes (Signed)
Patient has been faxed out due to [facility] not having available thought d/o beds. Patient meets inpatient criteria per [facility]. Patient referred to the following facilities:  [facility]  [address] [place] [address] [place]  [address], [place] [address] [phone] [place]  [address], [place] [address] [phone] [place]  [address], [place] [address] [phone] [place]  [address] [place] [address] [phone] [facility]  [address] [place] [address] [phone] [facility]  [place], [place] [address] [phone]  [facility]  [address] [place], [place] [address] [phone]  [facility]  [phone] [address] [place] [address] [phone] [facility]  [place]., [place] [address] [phone]  [facility]  [address], [place] [address] [phone] [facility]  [address] [place] [address] [place]  [facility]  [address] [place] [address] [phone]  [facility]  [address], [place] [address] [phone] [place] [address] [phone]  [facility]  [address], [place] [address] [phone]  [facility]  [place], [place] [address] [phone]  [facility]  [address]  [place], [place] [address] [phone]  [facility] [place] [address] [place]  [facility]  [address] [place] [address] [phone] [facility]  [address], [place] [address] [phone]  [facility]  [address] [place] [address] [phone] [facility]  [address], [place] [address] [phone] [place]., [place] [address] [phone]    CSW will continue to monitor disposition.    Mariea Clonts, MSW, LCSW-A  1:44 PM 09/24/2020
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
## 2020-09-24 NOTE — ED Notes (Addendum)
Pt showered without incident.

## 2020-09-24 NOTE — ED Notes (Signed)
Devaughn pastor [phone] would like to speak with the patient

## 2020-09-25 DIAGNOSIS — F250 Schizoaffective disorder, bipolar type: Secondary | ICD-10-CM | POA: Diagnosis not present

## 2020-09-25 LAB — CBG MONITORING, ED: Glucose-Capillary: 122 mg/dL — ABNORMAL HIGH (ref 70–99)

## 2020-09-25 MED ORDER — LORAZEPAM 2 MG/ML IJ SOLN
2.0000 mg | Freq: Once | INTRAMUSCULAR | Status: AC
  Administered 2020-09-25: 2 mg via INTRAMUSCULAR

## 2020-09-25 MED ORDER — DIPHENHYDRAMINE HCL 50 MG/ML IJ SOLN
INTRAMUSCULAR | Status: AC
  Administered 2020-09-25: 50 mg via INTRAMUSCULAR
  Filled 2020-09-25: qty 1

## 2020-09-25 MED ORDER — DIPHENHYDRAMINE HCL 50 MG/ML IJ SOLN: 50.0000 mg | Freq: Once | INTRAMUSCULAR | Status: AC
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
## 2020-09-25 NOTE — ED Provider Notes (Signed)
Emergency Medicine Observation Re-evaluation Note  [name] [name] is a 40 y.o. female, seen on rounds today.  Pt initially presented to the ED for complaints of psychiatric disturbance/ hallucinations Currently, the patient is sleeping  Physical Exam  BP (!) 136/94   Pulse (!) 102   Temp (!) 97.5 F (36.4 C) (Oral)   Resp 20   Ht 5\' 4"  (1.626 m)   Wt 76.7 kg   SpO2 100%   BMI 29.02 kg/m  Physical Exam General: NAD Cardiac: Borderline tachycardia (HR 90-100 bpm) Lungs: No respiratory distress Psych: Flat affect  ED Course / MDM  EKG:EKG Interpretation  Date/Time:  Friday September 21 2020 23:03:03 EDT Ventricular Rate:  103 PR Interval:  146 QRS Duration: 74 QT Interval:  332 QTC Calculation: 434 R Axis:   55 Text Interpretation: Sinus tachycardia Nonspecific T wave abnormality Abnormal ECG Poor data quality Confirmed by Dorie Rank [phone]) on 09/22/2020 2:47:41 PM  I have reviewed the labs performed to date as well as medications administered while in observation.    Plan  Current plan is for inpatient psychiatric placement. Patient has been medically cleared. [name] [name] is under involuntary commitment.      [name] Dusky, MD 09/25/20 [phone]

## 2020-09-25 NOTE — ED Notes (Addendum)
Patient approached this RN and stated "Is this where you apply to get a cane?" Patient also requesting assistance getting a hysterectomy, laser hair removal, and glasses. This RN assured a note would be made regarding her requests.

## 2020-09-25 NOTE — ED Notes (Signed)
Pt brought a hand written note to desk that stated " I'd like a MRI to see if I have any blood clots.

## 2020-09-25 NOTE — ED Notes (Signed)
Patient requesting flu shot. This RN reminded patient it is not flu shot season but that this RN would make note of her request

## 2020-09-25 NOTE — Progress Notes (Signed)
Patient has been faxed out due to no beds available for thought d/o. Patient meets inpatient criteria per [facility]. Patient referred to the following facilities:  [facility]  [address] [place] [address] [place]  [address] [place] [address] [phone] [place]  [address], [place] [address] [phone] [place]  [address], [place] [address] [phone] [place]  [address] [place] [address] [phone] [facility]  [address], [place] [address] [phone] [facility]  [place], [place] [address] [phone]  [facility]  [address] [place], [place] [address] [phone]  [facility]  [phone] [address] [place] [address] [phone] [facility]  [place] [place] [address] [phone]  [facility]  [address] [place] [address] [phone] [facility]  [address] [place] [address] [place]  [facility]  [address] [place] [address] [phone]  [facility]  [address], [place] [address] [phone] [place] [address] [place] [address] [phone]  [facility]  [address] [place], [place] [address] [phone]  [facility]  [place], [place] [address] [phone]  [facility]  [address] [place],  [place]  [address] [phone]  [facility] [place] [address] [place]  [facility]  [address] [place] [address] [phone] [facility]  [address] [place] [address] [phone]  [facility]  [address] [place] [address] [phone] [facility]  [address] [place] [address] [phone] [address] [place] [address] [phone]    CSW will continue to monitor disposition.    Mariea Clonts, MSW, LCSW-A  10:15 AM 09/25/2020

## 2020-09-25 NOTE — ED Notes (Signed)
Pt made two phone calls and is now showering. Sitter is changing the linens on her bed. This tech is going to assist the sitter.

## 2020-09-25 NOTE — ED Notes (Signed)
Patient up doing push ups and asked for bible; when bible given patient open the door and started reading scripture loudly in the unit; pt asked to read to herself in the room with the door closed so she does not disturb other patient's-Monique,RN

## 2020-09-25 NOTE — ED Notes (Addendum)
Patient requesting that [employer] [name] and Investment banker, operational) to be called by the doctor in the morning.  Patient contiues to ramble at the nurses station. Number in chart verified.  Patient stating " I am not gay, I am just divorced"     "Multi-vitamin Colace Zyprexa (given in [place] for "no reason")  Acetaminophen  Baby Aspirin Ibuprofen  Vitamin D, C, E"

## 2020-09-25 NOTE — ED Notes (Signed)
Pt out of room ask staff for legal aid . Pt asking if staff can help her apply for Medicaid in [place].
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
## 2020-09-25 NOTE — ED Notes (Signed)
Patient requesting her "green therapeutic shoes"

## 2020-09-26 DIAGNOSIS — F250 Schizoaffective disorder, bipolar type: Secondary | ICD-10-CM | POA: Diagnosis not present

## 2020-09-26 DIAGNOSIS — G4700 Insomnia, unspecified: Secondary | ICD-10-CM | POA: Diagnosis not present

## 2020-09-26 MED ORDER — STERILE WATER FOR INJECTION IJ SOLN
INTRAMUSCULAR | Status: AC
  Filled 2020-09-26: qty 10

## 2020-09-26 NOTE — ED Notes (Signed)
Patient continues to rest at this time. Chest rise and fall observed. Will continue to monitor.

## 2020-09-26 NOTE — ED Provider Notes (Signed)
Emergency Medicine Observation Re-evaluation Note  [name] [name] is a 40 y.o. female, seen on rounds today.  Pt initially presented to the ED for complaints of IVC, due to psychosis with delusions. Currently, the patient is resting, no distress, awake.Marland Kitchen  Physical Exam  BP 119/89   Pulse 98   Temp 98 F (36.7 C) (Oral)   Resp 16   Ht 5\' 4"  (1.626 m)   Wt 76.7 kg   SpO2 100%   BMI 29.02 kg/m  Physical Exam General: No distress, awake and alert Cardiac: Regular rate and rhythm Lungs: No increased work of Psych: Delusional  ED Course / MDM  EKG:EKG Interpretation  Date/Time:  Friday September 21 2020 23:03:03 EDT Ventricular Rate:  103 PR Interval:  146 QRS Duration: 74 QT Interval:  332 QTC Calculation: 434 R Axis:   55 Text Interpretation: Sinus tachycardia Nonspecific T wave abnormality Abnormal ECG Poor data quality Confirmed by Dorie Rank [phone]) on 09/22/2020 2:47:41 PM  I have reviewed the labs performed to date as well as medications administered while in observation.  Recent changes in the last 24 hours include none.  Plan  Current plan is for behavioral health placement. [name] [name] is under involuntary commitment.      Carmin Muskrat, MD 09/26/20 [phone]

## 2020-09-26 NOTE — ED Notes (Signed)
Patient requesting a dermatologist.

## 2020-09-26 NOTE — ED Notes (Signed)
Pt talking to TTS 

## 2020-09-26 NOTE — ED Notes (Signed)
Pt woke up from nap and asked to see the MD. Martin Majestic to the bathroom, back in her room. Will continue to monitor pt

## 2020-09-26 NOTE — ED Notes (Signed)
Patient given 3 cups of ice water per her request
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
## 2020-09-26 NOTE — ED Notes (Signed)
Patient refusing vital signs
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
## 2020-09-26 NOTE — ED Notes (Signed)
Patient now at the RN desk, requesting therapy with her pastor. Patient stating that she is willing to go to [facility], for two appointments as long as [place]on is her therapist.

## 2020-09-26 NOTE — ED Notes (Signed)
Pt still continues to go off on a tangent with the door opened towards the police officer. It has bee advised for the pt to lay down and relax on her bed, but the pt refuses to do this and continues to talk to the officer. Pt's train of thoughts are hyper-religious, wanting to go home and needing to talk to the doctor. Will continue to monitor pt.

## 2020-09-26 NOTE — ED Notes (Addendum)
Patient requesting legal services. This RN reminded patient that this is a hospital and does not offer legal services.

## 2020-09-26 NOTE — ED Notes (Addendum)
Patient states to this RN "You will pay. You should fear me". This RN continues to redirect patient back into room and into bed.  Patient requesting to shower, this RN reminded patient that it was too early to shower. Patient then stated "No, I hear you. She just doesn't want me to shower" This RN reminded patient of her concerns around recent medication administration and the increase likelihood patient could fall due to medication while in the shower. Patient states "I am not a fall risk"  Patient continues to refuse to lay down in bed. Patient shouting "Is your name written in the book of life? Do you believe in the lord and [place]?" This RN reminded patient that other patients were sleeping, and patient needed to keep her voice down and go into room.   Patient now stating "You will be sorry, I want you to be sorry"

## 2020-09-26 NOTE — ED Notes (Signed)
Pt's breakfast tray has arrived. Pt sat up and eating her breakfast. Will continue to monitor pt.

## 2020-09-26 NOTE — ED Notes (Signed)
Pt came out of her room and wanting to talk to the doctor. Pt has been made aware that the MD may or may not see her today. Pt continues to ask for a doctor despite being aware that they will come see her or not again, since they have done their rounds in the morning. Pt requesting to talk to a doctor, pt at nurses station hoping to talk to the nurse. Will continue to monitor pt.

## 2020-09-26 NOTE — Progress Notes (Signed)
Patient has been faxed out due to no thought d/o beds being available at [facility]. Patient meets inpatient criteria per Sheran Fava ,NP. Patient referred to the following facilities:  [facility]  [address] [place] [address] [place]  [address] [place] [address] [phone] [place]  [address] [place] [address] [phone] [place]  [address] [place] [address] [phone] [place]  [address] [facility] [place] [address] [phone] [facility]  [address] [place] [address] [phone] [facility]  [place], [place] [address] [phone]  [facility]  [address] [place], [place] [address] [phone]  [facility]  [phone] [address] [place] [address] [phone] [facility]  [address] [place] [place] [address] [phone]  [facility]  [address], [place] [address] [phone] [facility]  [address], [place] [address] [place]  [facility]  [address] [place] [address] [phone]  [facility]  [address] [place] [address] [phone] [place]., [place] [address] [phone]  [facility]  [address] [place] [address] [phone]  [facility]ult  [place], [place] [address] [phone]  [facility]  [address] [place], [place] [address] [phone]  [facility] [place] [address] [place]  [facility]  [address] [place] [address] [phone] [facility]  [address], [place] [address] [phone]  [facility]  [address], [place] [address] [phone] [facility]  [address], [place] [address] [phone] [place]., [place] [address] [phone]    CSW will continue to monitor disposition.  \  Mariea Clonts, MSW, LCSW-A  9:43 AM 09/26/2020

## 2020-09-26 NOTE — ED Notes (Addendum)
Patient called this RN into her room and stated "I am allergic to Zyprexa. 15mg  is an overdose to me. You killed me"
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
## 2020-09-26 NOTE — ED Notes (Signed)
Patient refusing lunch

## 2020-09-26 NOTE — ED Notes (Signed)
Pt refused depakote medication, stated "I'm not on depakote".

## 2020-09-26 NOTE — ED Notes (Signed)
Patient resting at this time. Chest rise and fall observed.

## 2020-09-26 NOTE — ED Notes (Signed)
Patient resting in stretcher comfortably. Eyes closed, Equal chest rise and fall. Patient alert to verbal stimuli. Call bell in reach, Stretcher in low and locked position. Side rails up x2.   

## 2020-09-26 NOTE — ED Notes (Signed)
Pt stated "I know I only have a skirt and shirt but can you go back to my [name] and ask if I can have my shoes? Since I am at work, wearing my scrubs. I am a nurse. And if you see my wig, please bring it back. My feet hurt without my shoes. Thank you". This Primary school teacher of pt;'s request. Will continue to monitor pt.
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
## 2020-09-26 NOTE — ED Notes (Signed)
Placed Breakfast order 

## 2020-09-26 NOTE — ED Notes (Signed)
Patient requested a band- aid for hair growth related to playing the clarinet

## 2020-09-26 NOTE — ED Notes (Signed)
Pt walking around the unit. RN asked if she wanted a shower. Pt stated they were okay with this. Pt currently taking a shower. Will continue to monitor  pt

## 2020-09-26 NOTE — ED Notes (Addendum)
Patient requesting Qtips. This RN denied request and listed concerns for safety since qtips are wooden
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
## 2020-09-26 NOTE — ED Notes (Signed)
Pt wanted her lunch after she had refused it. This writer heated up her lunch then asked if she needed anything else. Pt stated "no". This Probation officer stepped out and said "okay, well call out if you need anything!". Pt responded with "water". This Probation officer asked "would you like water?". Pt remained mute and nodded. This writer brought ice water to pt, when back into pt's room, pt stated "do you guys not have forks?!". This Probation officer had explained to pt that we do not have forks in the ER, but we could bring her one. Pt only responded with "dogs know the difference between a mess and a cleanliness. NOW LEAVE". This writer stepped out of the room, pt now yelling "the patients, the nurses and everyone here will not see god!". Will continue to monitor pt.
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
## 2020-09-26 NOTE — ED Notes (Signed)
Hourly rounding on pt. Equal rise and fall of chest noted. Pt is resting at this time.

## 2020-09-26 NOTE — ED Notes (Signed)
Will obtain vitals when pt is awake due to behaviors earlier today. Pt sleeping at this time. Equal rise and fall of chest noted.

## 2020-09-26 NOTE — ED Notes (Addendum)
1002: Pt asked to make a phonecall. Pt reminded of her 2 phonecalls, 5 minutes each. Pt currently making a phonecall. Will continue to monitor pt.   1009: Pt made a phone call and leaving a voice mail.

## 2020-09-26 NOTE — ED Notes (Signed)
Pt asked this writer if she could have her depakote. Pt stated they "I want 250 mg and that zyprexa makes her too tired. In fact I overdosed on it. S, last month in [place], they put me on depakote." RN notified, will continue to monitor pt.

## 2020-09-26 NOTE — ED Notes (Addendum)
Pt walking around unit with no shirt on at this time. Redirected to room. Pt yelling "I AM AN HONOR STUDENT".  This RN asked patient to please put shirt on. Patient compliant and place purple scrub top back on.

## 2020-09-26 NOTE — ED Notes (Addendum)
Patient rambling about ex-husbands and ex-boyfriends and her sex life. Patient reminded this is inappropriate to share with staff. Patient redirected back to her room.
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
## 2020-09-26 NOTE — ED Notes (Signed)
Patient requesting Lamictal instead of Zyprexa. States she took it in the [place]. And it kept her "out of Limbo".

## 2020-09-26 NOTE — Progress Notes (Signed)
Pt accepted to [facility]    Patient meets inpatient criteria per Sheran Fava, NP   Dr. Jonelle Sports is the attending provider.    Call report to [phone] or [phone]  Bartholomew Boards, RN @ [facility] ED notified.     Pt scheduled  to arrive at [place] after 0800.   Mariea Clonts, MSW, LCSW-A  12:57 PM 09/26/2020

## 2020-09-26 NOTE — ED Notes (Addendum)
Patient continues to talk to internal stimuli.

## 2020-09-26 NOTE — ED Notes (Signed)
Patient coming out of room, very tangential in nature, acting agitated requesting to speak with a doctor at this time dissatisfied with care. Pt arguing with staff stating that, "I would've never received this type of care in [place]". Pt retreated back to room, actually fully undressed herself in the hallway. 10 mg of Zyprexa given IM with [employer] with no issue.

## 2020-09-26 NOTE — ED Notes (Signed)
Pt in room, requested lip balm which was provided by sitter. Pt in room sitting on bed.

## 2020-09-26 NOTE — ED Notes (Signed)
Patient continues to escalate.

## 2020-09-27 DIAGNOSIS — F250 Schizoaffective disorder, bipolar type: Secondary | ICD-10-CM | POA: Diagnosis not present

## 2020-09-27 LAB — CBG MONITORING, ED: Glucose-Capillary: 111 mg/dL — ABNORMAL HIGH (ref 70–99)
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
## 2020-09-27 NOTE — ED Notes (Signed)
Patient is at the nursing station continuously asking for new items to bring to her room and also needs to talk on the phone constantly.

## 2020-09-27 NOTE — ED Provider Notes (Signed)
I was notifed that the patient is going to be transferred to [facility] for inpatient psychiatric treatment.  Patient is alert and awake talking on the telephone.  Vital signs are stable this morning.  The patient is stable for transfer to the psychiatric facility.     Dorie Rank, MD 09/27/20 [phone]

## 2020-10-09 ENCOUNTER — Emergency Department (HOSPITAL_COMMUNITY)
Admission: EM | Admit: 2020-10-09 | Discharge: 2020-10-19 | Disposition: A | Discharge status: Other Acute Inpatient Hospital | Payer: Medicare Other | Attending: Emergency Medicine | Admitting: Emergency Medicine

## 2020-10-09 ENCOUNTER — Ambulatory Visit (INDEPENDENT_AMBULATORY_CARE_PROVIDER_SITE_OTHER)
Admission: EM | Admit: 2020-10-09 | Discharge: 2020-10-09 | Disposition: A | Discharge status: Other Acute Inpatient Hospital | Payer: Medicare Other | Source: Home / Self Care

## 2020-10-09 ENCOUNTER — Other Ambulatory Visit: Payer: Self-pay

## 2020-10-09 ENCOUNTER — Encounter (HOSPITAL_COMMUNITY): Payer: Self-pay | Admitting: Registered Nurse

## 2020-10-09 DIAGNOSIS — E119 Type 2 diabetes mellitus without complications: Secondary | ICD-10-CM | POA: Diagnosis not present

## 2020-10-09 DIAGNOSIS — F309 Manic episode, unspecified: Secondary | ICD-10-CM | POA: Insufficient documentation

## 2020-10-09 DIAGNOSIS — F3010 Manic episode without psychotic symptoms, unspecified: Secondary | ICD-10-CM

## 2020-10-09 DIAGNOSIS — F29 Unspecified psychosis not due to a substance or known physiological condition: Secondary | ICD-10-CM | POA: Diagnosis not present

## 2020-10-09 DIAGNOSIS — F250 Schizoaffective disorder, bipolar type: Secondary | ICD-10-CM | POA: Insufficient documentation

## 2020-10-09 DIAGNOSIS — N9489 Other specified conditions associated with female genital organs and menstrual cycle: Secondary | ICD-10-CM | POA: Insufficient documentation

## 2020-10-09 DIAGNOSIS — Z20822 Contact with and (suspected) exposure to covid-19: Secondary | ICD-10-CM | POA: Insufficient documentation

## 2020-10-09 DIAGNOSIS — Z7984 Long term (current) use of oral hypoglycemic drugs: Secondary | ICD-10-CM | POA: Insufficient documentation

## 2020-10-09 DIAGNOSIS — Z79899 Other long term (current) drug therapy: Secondary | ICD-10-CM | POA: Diagnosis not present

## 2020-10-09 DIAGNOSIS — F23 Brief psychotic disorder: Secondary | ICD-10-CM

## 2020-10-09 DIAGNOSIS — F259 Schizoaffective disorder, unspecified: Secondary | ICD-10-CM | POA: Diagnosis not present

## 2020-10-09 DIAGNOSIS — R462 Strange and inexplicable behavior: Secondary | ICD-10-CM | POA: Diagnosis present

## 2020-10-09 LAB — CBC WITH DIFFERENTIAL/PLATELET
Abs Immature Granulocytes: 0.05 10*3/uL (ref 0.00–0.07)
Basophils Absolute: 0 10*3/uL (ref 0.0–0.1)
Basophils Relative: 0 %
Eosinophils Absolute: 0 10*3/uL (ref 0.0–0.5)
Eosinophils Relative: 0 %
HCT: 36.3 % (ref 36.0–46.0)
Hemoglobin: 12 g/dL (ref 12.0–15.0)
Immature Granulocytes: 1 %
Lymphocytes Relative: 16 %
Lymphs Abs: 1.8 10*3/uL (ref 0.7–4.0)
MCH: 31.6 pg (ref 26.0–34.0)
MCHC: 33.1 g/dL (ref 30.0–36.0)
MCV: 95.5 fL (ref 80.0–100.0)
Monocytes Absolute: 1.2 10*3/uL — ABNORMAL HIGH (ref 0.1–1.0)
Monocytes Relative: 11 %
Neutro Abs: 8 10*3/uL — ABNORMAL HIGH (ref 1.7–7.7)
Neutrophils Relative %: 72 %
Platelets: 264 10*3/uL (ref 150–400)
RBC: 3.8 MIL/uL — ABNORMAL LOW (ref 3.87–5.11)
RDW: 13.1 % (ref 11.5–15.5)
WBC: 11 10*3/uL — ABNORMAL HIGH (ref 4.0–10.5)
nRBC: 0 % (ref 0.0–0.2)

## 2020-10-09 LAB — COMPREHENSIVE METABOLIC PANEL
ALT: 27 U/L (ref 0–44)
AST: 55 U/L — ABNORMAL HIGH (ref 15–41)
Albumin: 4.4 g/dL (ref 3.5–5.0)
Alkaline Phosphatase: 50 U/L (ref 38–126)
Anion gap: 16 — ABNORMAL HIGH (ref 5–15)
BUN: 18 mg/dL (ref 6–20)
CO2: 23 mmol/L (ref 22–32)
Calcium: 9.6 mg/dL (ref 8.9–10.3)
Chloride: 107 mmol/L (ref 98–111)
Creatinine, Ser: 0.98 mg/dL (ref 0.44–1.00)
GFR, Estimated: >60 mL/min (ref >60)
Glucose, Bld: 99 mg/dL (ref 70–99)
Potassium: 4 mmol/L (ref 3.5–5.1)
Sodium: 146 mmol/L — ABNORMAL HIGH (ref 135–145)
Total Bilirubin: 0.9 mg/dL (ref 0.3–1.2)
Total Protein: 8.5 g/dL — ABNORMAL HIGH (ref 6.5–8.1)

## 2020-10-09 LAB — RESP PANEL BY RT-PCR (FLU A&B, COVID) ARPGX2
Influenza A by PCR: NEGATIVE
Influenza B by PCR: NEGATIVE
SARS Coronavirus 2 by RT PCR: NEGATIVE

## 2020-10-09 LAB — ETHANOL: Alcohol, Ethyl (B): <10 mg/dL (ref <10)

## 2020-10-09 LAB — HCG, QUANTITATIVE, PREGNANCY: hCG, Beta Chain, Quant, S: <1 m[IU]/mL (ref <5)

## 2020-10-09 MED ORDER — LORAZEPAM 1 MG PO TABS
1.0000 mg | ORAL_TABLET | ORAL | Status: AC | PRN
  Administered 2020-10-10: 1 mg via ORAL
  Filled 2020-10-09: qty 1

## 2020-10-09 MED ORDER — ZIPRASIDONE MESYLATE 20 MG IM SOLR
20.0000 mg | INTRAMUSCULAR | Status: DC | PRN
  Administered 2020-10-10 – 2020-10-12 (×6): 20 mg via INTRAMUSCULAR
  Filled 2020-10-09 (×6): qty 20

## 2020-10-09 MED ORDER — SODIUM CHLORIDE 0.9 % IV BOLUS: 1000.0000 mL | Freq: Once | INTRAVENOUS | Status: DC

## 2020-10-09 MED ORDER — LORAZEPAM 2 MG/ML IJ SOLN
1.0000 mg | Freq: Once | INTRAMUSCULAR | Status: DC
  Filled 2020-10-09: qty 1

## 2020-10-09 MED ORDER — STERILE WATER FOR INJECTION IJ SOLN
INTRAMUSCULAR | Status: AC
  Administered 2020-10-09: 10 mL
  Filled 2020-10-09: qty 10

## 2020-10-09 MED ORDER — ZIPRASIDONE MESYLATE 20 MG IM SOLR
20.0000 mg | INTRAMUSCULAR | Status: AC | PRN
Start: 2020-10-09 — End: 2020-10-09
  Administered 2020-10-09: 20 mg via INTRAMUSCULAR
  Filled 2020-10-09: qty 20

## 2020-10-09 MED ORDER — LORAZEPAM 2 MG/ML IJ SOLN: 1.0000 mg | Freq: Once | INTRAMUSCULAR | Status: DC

## 2020-10-09 MED ORDER — LORAZEPAM 2 MG/ML IJ SOLN
1.0000 mg | Freq: Four times a day (QID) | INTRAMUSCULAR | Status: AC | PRN
  Administered 2020-10-09 – 2020-10-12 (×2): 1 mg via INTRAMUSCULAR
  Filled 2020-10-09: qty 1

## 2020-10-09 NOTE — ED Notes (Signed)
Pt refused vital signs.

## 2020-10-09 NOTE — ED Triage Notes (Signed)
Pt transferred from [facility], and is under IVC

## 2020-10-09 NOTE — ED Notes (Signed)
Patient coming from [facility] reports patient is manic-throwing things-patient is IVC'd

## 2020-10-09 NOTE — ED Provider Notes (Addendum)
[facility] DEPT Provider Note   CSN: [id number] Arrival date & time: 10/09/20  1428     History Chief Complaint  Patient presents with   Medical Clearance    [name] [name] is a 40 y.o. female.  40 year old female with a past medical history of schizoaffective disorder, bipolar disorder was brought in by her friend due to decompensating since leaving [place] home on Friday.  Patient was noted to have erratic behavior, agitated, talking loudly and dancing in the parking lot.  During my examination patient was patient in the room, responding to internal stimuli, and has rambling speech with loose associations.  When asked about her medications she states "they are not necessary".   The history is provided by the patient. No language interpreter was used.      Past Medical History:  Diagnosis Date   Bipolar affective disorder, currently manic, mild ([place])    Diabetes mellitus without complication ([place])    Schizophrenia [facility])     Patient Active Problem List   Diagnosis Date Noted   Manic behavior ([place]) 10/09/2020   Psychosis ([place]) 07/13/2019   Labor without complication 20/25/4270   Indication for care in labor or delivery 07/10/2019   Third trimester pregnancy 07/06/2019   [redacted] weeks gestation of pregnancy    AMA (advanced maternal age) multigravida 35+, third trimester 07/04/2019   Supervision of high risk pregnancy, antepartum 07/04/2019   No prenatal care in current pregnancy in third trimester 07/04/2019   Obesity in pregnancy 07/04/2019   BMI 30s 07/04/2019   History of cesarean delivery 07/04/2019   Short interval between pregnancies affecting pregnancy in third trimester, antepartum 07/04/2019   Adjustment disorder with mixed disturbance of emotions and conduct 07/11/2017   Acute psychosis ([place]) 12/21/2015   Insomnia    Anxiety state    Overactive bladder    Diabetes mellitus ([place]) 02/08/2015   Schizoaffective disorder, bipolar  type ([place]) 01/28/2015   Non compliance w medication regimen     Past Surgical History:  Procedure Laterality Date   CESAREAN SECTION  04/2018   WISDOM TOOTH EXTRACTION       OB History     Gravida  2   Para  2   Term  2   Preterm  0   AB  0   Living  2      SAB  0   IAB  0   Ectopic  0   Multiple      Live Births  2           Family History  Problem Relation Age of Onset   Drug abuse Maternal Uncle     Social History   Tobacco Use   Smoking status: Never   Smokeless tobacco: Never  Vaping Use   Vaping Use: Never used  Substance Use Topics   Alcohol use: Not Currently   Drug use: Not Currently    Home Medications Prior to Admission medications   Medication Sig Start Date End Date Taking? Authorizing Provider  divalproex (DEPAKOTE ER) 250 MG 24 hr tablet Take 1 tablet (250 mg total) by mouth every morning. 07/20/19   Connye Burkitt, NP  divalproex (DEPAKOTE ER) 500 MG 24 hr tablet Take 1 tablet (500 mg total) by mouth at bedtime. 07/19/19   Connye Burkitt, NP  hydrocerin (EUCERIN) CREA Apply 1 application topically 2 (two) times daily. Patient not taking: Reported on 09/21/2020 07/19/19   Connye Burkitt, NP  metFORMIN (GLUCOPHAGE)  500 MG tablet Take 500 mg by mouth 2 (two) times daily with a meal.    [provider]  metoprolol succinate (TOPROL-XL) 25 MG 24 hr tablet Take 1 tablet (25 mg total) by mouth daily. 07/20/19   Connye Burkitt, NP  OLANZapine (ZYPREXA) 15 MG tablet Take 1 tablet (15 mg total) by mouth daily. 09/19/20   Derrill Center, NP  OLANZapine zydis (ZYPREXA) 15 MG disintegrating tablet Take 1 tablet (15 mg total) by mouth once for 1 dose. 09/19/20 09/19/20  Derrill Center, NP  Prenatal Vit-Fe Fumarate-FA (PRENATAL MULTIVITAMIN) TABS tablet Take 1 tablet by mouth daily at 12 noon. Patient not taking: Reported on 09/21/2020 07/06/19   Clapacs, Madie Reno, MD  spironolactone (ALDACTONE) 25 MG tablet Take 0.5 tablets (12.5 mg total) by  mouth daily. 07/20/19   Connye Burkitt, NP    Allergies    Quetiapine, Gabapentin, Lorazepam, Olanzapine, Risperidone, Valproic acid, Aripiprazole, Fluphenazine, Haloperidol, Trazodone, and Ziprasidone hcl  Review of Systems   Review of Systems  Unable to perform ROS: Psychiatric disorder   Physical Exam Updated Vital Signs BP (!) 135/95 (BP Location: Right Arm)   Pulse (!) 141   Temp 98.8 F (37.1 C) (Oral)   Resp (!) 24   SpO2 99%   Physical Exam Vitals and nursing note reviewed.  Constitutional:      General: She is not in acute distress.    Appearance: Normal appearance. She is not ill-appearing.  HENT:     Head: Normocephalic and atraumatic.     Nose: Nose normal.  Eyes:     Conjunctiva/sclera: Conjunctivae normal.  Pulmonary:     Effort: Pulmonary effort is normal. No respiratory distress.  Musculoskeletal:        General: No deformity.  Skin:    Findings: No rash.  Neurological:     Mental Status: She is alert.  Psychiatric:        Attention and Perception: She is inattentive.        Mood and Affect: Affect is inappropriate.        Speech: Speech is rapid and pressured and tangential.        Behavior: Behavior is uncooperative.        Thought Content: Thought content is delusional.        Cognition and Memory: Cognition is impaired.        Judgment: Judgment is inappropriate.    ED Results / Procedures / Treatments   Labs (all labs ordered are listed, but only abnormal results are displayed) Labs Reviewed  RESP PANEL BY RT-PCR (FLU A&B, COVID) ARPGX2  COMPREHENSIVE METABOLIC PANEL  ETHANOL  RAPID URINE DRUG SCREEN, HOSP PERFORMED  CBC WITH DIFFERENTIAL/PLATELET  URINALYSIS, ROUTINE W REFLEX MICROSCOPIC  I-STAT BETA HCG BLOOD, ED ([facility], [facility], [facility] ONLY)    EKG None  Radiology No results found.  Procedures Procedures   Medications Ordered in ED Medications  LORazepam (ATIVAN) tablet 1 mg (has no administration in time range)    And  ziprasidone  (GEODON) injection 20 mg (has no administration in time range)    ED Course  I have reviewed the triage vital signs and the nursing notes.  Pertinent labs & imaging results that were available during my care of the patient were reviewed by me and considered in my medical decision making (see chart for details).    MDM Rules/Calculators/A&P  39 year old female with past medical history of schizoaffective and bipolar disorder who is brought in for IVC due to recent decompensation following release from [facility]..  First assessment has been done by psychiatry.  Patient on exam appears to be agitated is pacing in the room, has rambling speech.  CBC, CMP, EKG, ethanol level, UA, and UDS ordered for medical evaluation. Lab work predominantly unremarkable with the exception of WBC of 11.0, sodium of 146, and AST of 55. Her heart rate is 130. Will give IV fluids and re-evaluate. Patient is medically cleared for psych eval.   Unable to give IV fluids but patient's heart rate improved to 113.  Final Clinical Impression(s) / ED Diagnoses Final diagnoses:  None    Rx / DC Orders ED Discharge Orders     None        Evlyn Courier, PA-C 10/09/20 1841    Evlyn Courier, PA-C 10/09/20 2056    Lucrezia Starch, MD 10/11/20 [phone]

## 2020-10-09 NOTE — Progress Notes (Addendum)
Patient transferred to [place] due to delusional/manic behavior.  Disorganized thought process. Report given to [facility] long ED charge nurse.  Patient IVC'd.  Escorted by [employer] and ambulated out of building.

## 2020-10-09 NOTE — BH Assessment (Addendum)
Clinician attempted to complete patient's TTS assessment, @2040 , per Dorian Pod, RN, "she was medicated and is currently sleeping". Patient unable to complete a TTS at this time. Nursing agreed to contact TTS when patient is available to be seen.

## 2020-10-09 NOTE — ED Notes (Signed)
Pt got up very agitated and went to shower, continued to talk to people who where not there and could not make a full sentence. MD called for prn meds. PRN meds given  per mar

## 2020-10-09 NOTE — ED Provider Notes (Signed)
Behavioral Health Urgent Care Medical Screening Exam  Patient Name: [name] [name] MRN: [id number] Date of Evaluation: 10/09/20 Chief Complaint:   Diagnosis:  Final diagnoses:  Manic behavior ([place])  Schizoaffective disorder, bipolar type ([place] [address])    History of Present illness: [name] [name] is a 40 y.o. female patient presented to [facility] as a walk in accompanied by care giver with complaints of bizarre, manic behavior  [name] [name], 40 y.o., female patient seen face to face by this provider, consulted with Dr. Hampton Abbot; and chart reviewed on 10/09/20.  On evaluation [name] [name] is not a good historian.  Patient care giver reports that patient was just discharged from [facility] 3 days ago but patient hasn't taken any medications since discharge.   IVC in progress.  Spoke to Dr. Kathrynn Humble at [facility] ED informed of assessment and patient and sending to ED for medical clearance for inpatient psychiatric admission.     Psychiatric Specialty Exam  Presentation  General Appearance:Bizarre  Eye Contact:Fleeting  Speech:Clear and Coherent  Speech Volume:Normal  Handedness:Right   Mood and Affect  Mood:Labile  Affect:Labile   Thought Process  Thought Processes:Disorganized; Irrevelant  Descriptions of Associations:Tangential  Orientation:Full (Time, Place and Person)  Thought Content:Delusions; Paranoid Ideation  Diagnosis of Schizophrenia or Schizoaffective disorder in past: Yes  Duration of Psychotic Symptoms: Greater than six months  Hallucinations:None Patient has been seen responding to auditory hallucinations.  No discription of voices was given  Ideas of Reference:Paranoia; Delusions  Suicidal Thoughts:No  Homicidal Thoughts:No   Sensorium  Memory:Immediate Poor; Recent Poor  Judgment:Impaired  Insight:Lacking   Executive Functions  Concentration:Poor  Attention Span:Poor  Recall:Poor  Fund of  Knowledge:Poor  Language:Poor   Psychomotor Activity  Psychomotor Activity:Restlessness   Assets  Assets:Social Support   Sleep  Sleep:Poor  Number of hours:  No data recorded  Nutritional Assessment (For OBS and FBC admissions only) Has the patient had a weight loss or gain of 10 pounds or more in the last 3 months?: No Has the patient had a decrease in food intake/or appetite?: No Does the patient have dental problems?: No Does the patient have eating habits or behaviors that may be indicators of an eating disorder including binging or inducing vomiting?: No Has the patient recently lost weight without trying?: 0 Has the patient been eating poorly because of a decreased appetite?: 0 Malnutrition Screening Tool Score: 0    Physical Exam: Physical Exam Vitals and nursing note reviewed. Exam conducted with a chaperone present.  Constitutional:      General: She is not in acute distress.    Appearance: Normal appearance. She is not ill-appearing.  Cardiovascular:     Rate and Rhythm: Normal rate.  Pulmonary:     Effort: Pulmonary effort is normal.  Musculoskeletal:        General: Normal range of motion.     Cervical back: Normal range of motion.  Skin:    General: Skin is warm and dry.  Neurological:     Mental Status: She is alert.  Psychiatric:        Attention and Perception: She is inattentive.        Mood and Affect: Mood is anxious. Affect is labile.        Speech: Speech is rapid and pressured.        Behavior: Behavior is uncooperative.        Thought Content: Thought content is delusional.        Judgment: Judgment is impulsive.  Review of Systems  Unable to perform ROS: Acuity of condition (Unable to get answers from patient)  unknown if currently breastfeeding. There is no height or weight on file to calculate BMI.  Musculoskeletal: Strength & Muscle Tone: within normal limits Gait & Station: normal Patient leans: N/A   [place] MSE Discharge  Disposition for Follow up and Recommendations: Based on my evaluation I certify that psychiatric inpatient services furnished can reasonably be expected to improve the patient's condition which I recommend transfer to an appropriate accepting facility. Sending to [facility] ED for medical clearance   Spoke to Dr. Kathrynn Humble via telephone and sent secure message informing of patient  Earleen Newport, NP 10/09/2020, 1:22 PM

## 2020-10-09 NOTE — BH Assessment (Signed)
Pt presents to [facility] manic, with bizzar and erratic behaviors. Pt is talking loudly, dancing in the parking lot and agitated. Pt here with her friend who reports pt was released from [facility] on Friday and has since decompensated.   Pt is emergent

## 2020-10-10 DIAGNOSIS — F250 Schizoaffective disorder, bipolar type: Secondary | ICD-10-CM | POA: Diagnosis not present

## 2020-10-10 MED ORDER — OLANZAPINE 10 MG PO TBDP
10.0000 mg | ORAL_TABLET | Freq: Two times a day (BID) | ORAL | Status: DC
  Administered 2020-10-10 – 2020-10-14 (×7): 10 mg via ORAL
  Filled 2020-10-10 (×7): qty 1

## 2020-10-10 MED ORDER — STERILE WATER FOR INJECTION IJ SOLN
INTRAMUSCULAR | Status: AC
  Filled 2020-10-10: qty 10

## 2020-10-10 NOTE — ED Provider Notes (Signed)
Emergency Medicine Observation Re-evaluation Note  [name] [name] is a 40 y.o. female, seen on rounds today.  Pt initially presented to the ED for complaints of Medical Clearance Currently, the patient is awaiting placement  Physical Exam  BP (!) 141/90 (BP Location: Left Arm)   Pulse (!) 118   Temp 97.8 F (36.6 C) (Oral)   Resp 20   SpO2 100%  Physical Exam Awake and aggressive  ED Course / MDM  EKG:   I have reviewed the labs performed to date as well as medications administered while in observation.    Plan  Current plan is for placement [facility] is under involuntary commitment.      Milton Ferguson, MD 10/10/20 [phone]

## 2020-10-10 NOTE — BH Assessment (Signed)
TTS clinician attempted to complete assessment. Per Dorian Pod, patient was medicated and asleep, unable to participate in assessment. TTS will attempt at later time.

## 2020-10-10 NOTE — BH Assessment (Signed)
TTS attempted to complete assessment. Per RN, patient was medicated and asleep, unable to participate in assessment. TTS will attempt at later time.
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
## 2020-10-10 NOTE — ED Notes (Signed)
Pt has been cooperative this afternoon.  Focused on going home.

## 2020-10-10 NOTE — ED Notes (Signed)
[name] [name] (Patient would like this number on her chart).

## 2020-10-10 NOTE — ED Notes (Signed)
Patient reports that she does not want to take Depakote and she also does not want to take Zyprexa because it makes her tongue hurt.  She is requesting Risperdal.

## 2020-10-11 DIAGNOSIS — F259 Schizoaffective disorder, unspecified: Secondary | ICD-10-CM | POA: Diagnosis not present

## 2020-10-11 DIAGNOSIS — F250 Schizoaffective disorder, bipolar type: Secondary | ICD-10-CM | POA: Diagnosis not present

## 2020-10-11 LAB — URINALYSIS, ROUTINE W REFLEX MICROSCOPIC
Bilirubin Urine: NEGATIVE
Glucose, UA: NEGATIVE mg/dL
Hgb urine dipstick: NEGATIVE
Ketones, ur: NEGATIVE mg/dL
Nitrite: NEGATIVE
Protein, ur: NEGATIVE mg/dL
Specific Gravity, Urine: 1.006 (ref 1.005–1.030)
pH: 6 (ref 5.0–8.0)

## 2020-10-11 LAB — RAPID URINE DRUG SCREEN, HOSP PERFORMED
Amphetamines: NOT DETECTED
Barbiturates: NOT DETECTED
Benzodiazepines: NOT DETECTED
Cocaine: NOT DETECTED
Opiates: NOT DETECTED
Tetrahydrocannabinol: NOT DETECTED

## 2020-10-11 LAB — PREGNANCY, URINE: Preg Test, Ur: NEGATIVE

## 2020-10-11 MED ORDER — STERILE WATER FOR INJECTION IJ SOLN
INTRAMUSCULAR | Status: AC
  Administered 2020-10-11: 10 mL
  Filled 2020-10-11: qty 10

## 2020-10-11 MED ORDER — LORAZEPAM 2 MG/ML IJ SOLN
2.0000 mg | Freq: Once | INTRAMUSCULAR | Status: AC
  Administered 2020-10-11: 2 mg via INTRAMUSCULAR
  Filled 2020-10-11: qty 1

## 2020-10-11 MED ORDER — STERILE WATER FOR INJECTION IJ SOLN
INTRAMUSCULAR | Status: AC
  Filled 2020-10-11: qty 10

## 2020-10-11 NOTE — ED Notes (Signed)
Patient talking loud.  Verbally aggressive toward staff.  Unable to redirect.

## 2020-10-11 NOTE — ED Notes (Signed)
Patient manic and verbally aggressive toward staff.

## 2020-10-11 NOTE — Consult Note (Signed)
Psych ED COnsult note  Reason for Consult:  Psychosis and manic behavior Referring Physician:  EDP Location of Patient: [facility]  Patient Identification: [name] [name] MRN:  [id number] Principal Diagnosis: Psychosis ([place]) Diagnosis:  Principal Problem:   Psychosis ([place])   Total Time spent with patient: 30 minutes  Subjective:   [name] [name] is a 40 y.o. female patient admitted to [facility] ED after being sent from [facility] for medical clearance for psychiatric hospitalization.  Patient initially presented to [facility] with complaints of paranoia, delusions, bizarre and hyper religious behavior.  [name] [name], 40 y.o., female patient seen via tele health for psychiatric reassessment by this provider, consulted with Dr. Hampton Abbot; and chart reviewed on 10/11/20.  On evaluation [name] [name]  continues to present with restlessness, hyper religious, delusional, and paranoid.  Patient remains delusional noting that she is pregnant and watns to keep the baby. This is similar to her last presentation about 3 weeks ago. Urine pregnancy has not been ordered, which she has requested. Patient continues to perseverate about her medications Zyprexa, Ambien, and Risperdal. She continues to have flight of ideas, easily distracted, and tangential thought processes.    During evaluation [name] [name] is sitting up right in bed in no acute distress.  She is alert, oriented x 2.  She was able to give correct information on DOB, Age, Place, [place].  Patient is somewhat calm during assessment only irritated when she feels she is being interrupted or when asking when she will be able to go home.  Patient recently discharged from [facility], unclear of medications she was discharged on but stats it was Zyprexa. SHe further reports she no longer takes Depakote anymore and would like Risperdal in its place. She denies any suicidal ideations, homicidal ideations, and hallucinations.   Past  Psychiatric History: Schizophrenia, bipolar affect disorder, schizoaffective disorder bipolar type, depression, and anxiety  Risk to Self:  Denies Risk to Others:  denies Prior Inpatient Therapy:  Significant history of psychiatric hospitalizations Prior Outpatient Therapy:  Yes  Past Medical History:  Past Medical History:  Diagnosis Date   Bipolar affective disorder, currently manic, mild ([place])    Diabetes mellitus without complication ([place])    Schizophrenia ([place])     Past Surgical History:  Procedure Laterality Date   CESAREAN SECTION  04/2018   WISDOM TOOTH EXTRACTION     Family History:  Family History  Problem Relation Age of Onset   Drug abuse Maternal Uncle    Family Psychiatric  History: Unaware Social History:  Social History   Substance and Sexual Activity  Alcohol Use Not Currently     Social History   Substance and Sexual Activity  Drug Use Not Currently    Social History   Socioeconomic History   Marital status: Legally Separated    Spouse name: Not on file   Number of children: Not on file   Years of education: Not on file   Highest education level: Not on file  Occupational History   Not on file  Tobacco Use   Smoking status: Never   Smokeless tobacco: Never  Vaping Use   Vaping Use: Never used  Substance and Sexual Activity   Alcohol use: Not Currently   Drug use: Not Currently   Sexual activity: Not Currently  Other Topics Concern   Not on file  Social History Narrative   ** Merged History Encounter **       Social Determinants of Health   Financial Resource Strain:  Not on file  Food Insecurity: Not on file  Transportation Needs: Not on file  Physical Activity: Not on file  Stress: Not on file  Social Connections: Not on file   Additional Social History:    Allergies:   Allergies  Allergen Reactions   Quetiapine Anaphylaxis    "Pass out"   Gabapentin Other (See Comments)    "Feet on fire"   Lorazepam Other (See  Comments)    unknown   Olanzapine    Risperidone Other (See Comments)    Pt reports "It makes me blind"    Valproic Acid Other (See Comments)    Pt reports "it makes me too sleepy"    Aripiprazole Other (See Comments)    Makes patient "too tired"   Fluphenazine Rash   Haloperidol Other (See Comments)    Makes feel hot inside. "Tires my tongue"    Trazodone Other (See Comments)   Ziprasidone Hcl Palpitations    Labs:  Results for orders placed or performed during the hospital encounter of 10/09/20 (from the past 48 hour(s))  Comprehensive metabolic panel     Status: Abnormal   Collection Time: 10/09/20  4:06 PM  Result Value Ref Range   Sodium 146 (H) 135 - 145 mmol/L   Potassium 4.0 3.5 - 5.1 mmol/L   Chloride 107 98 - 111 mmol/L   CO2 23 22 - 32 mmol/L   Glucose, Bld 99 70 - 99 mg/dL    Comment: Glucose reference range applies only to samples taken after fasting for at least 8 hours.   BUN 18 6 - 20 mg/dL   Creatinine, Ser 0.98 0.44 - 1.00 mg/dL   Calcium 9.6 8.9 - 10.3 mg/dL   Total Protein 8.5 (H) 6.5 - 8.1 g/dL   Albumin 4.4 3.5 - 5.0 g/dL   AST 55 (H) 15 - 41 U/L   ALT 27 0 - 44 U/L   Alkaline Phosphatase 50 38 - 126 U/L   Total Bilirubin 0.9 0.3 - 1.2 mg/dL   GFR, Estimated >60 >60 mL/min    Comment: (NOTE) Calculated using the CKD-EPI Creatinine Equation (2021)    Anion gap 16 (H) 5 - 15    Comment: Performed at [facility], [place] [address], [place], [place] [address]  Ethanol     Status: None   Collection Time: 10/09/20  4:06 PM  Result Value Ref Range   Alcohol, Ethyl (B) <10 <10 mg/dL    Comment: (NOTE) Lowest detectable limit for serum alcohol is 10 mg/dL.  For medical purposes only. Performed at [facility], [place] [address], [place], [place] [address]   CBC with Diff     Status: Abnormal   Collection Time: 10/09/20  4:06 PM  Result Value Ref Range   WBC 11.0 (H) 4.0 - 10.5 K/uL   RBC 3.80 (L) 3.87 - 5.11  MIL/uL   Hemoglobin 12.0 12.0 - 15.0 g/dL   HCT 36.3 36.0 - 46.0 %   MCV 95.5 80.0 - 100.0 fL   MCH 31.6 26.0 - 34.0 pg   MCHC 33.1 30.0 - 36.0 g/dL   RDW 13.1 11.5 - 15.5 %   Platelets 264 150 - 400 K/uL   nRBC 0.0 0.0 - 0.2 %   Neutrophils Relative % 72 %   Neutro Abs 8.0 (H) 1.7 - 7.7 K/uL   Lymphocytes Relative 16 %   Lymphs Abs 1.8 0.7 - 4.0 K/uL   Monocytes Relative 11 %  Monocytes Absolute 1.2 (H) 0.1 - 1.0 K/uL   Eosinophils Relative 0 %   Eosinophils Absolute 0.0 0.0 - 0.5 K/uL   Basophils Relative 0 %   Basophils Absolute 0.0 0.0 - 0.1 K/uL   Immature Granulocytes 1 %   Abs Immature Granulocytes 0.05 0.00 - 0.07 K/uL    Comment: Performed at [facility], [place] [address], [place], [place] [address]  hCG, quantitative, pregnancy     Status: None   Collection Time: 10/09/20  4:06 PM  Result Value Ref Range   hCG, Beta Chain, Quant, S <1 <5 mIU/mL    Comment:          GEST. AGE      CONC.  (mIU/mL)   <=1 WEEK        5 - 50     2 WEEKS       50 - 500     3 WEEKS       100 - 10,000     4 WEEKS     1,000 - 30,000     5 WEEKS     3,500 - 115,000   6-8 WEEKS     12,000 - 270,000    12 WEEKS     15,000 - 220,000        FEMALE AND NON-PREGNANT FEMALE:     LESS THAN 5 mIU/mL Performed at [facility], [place] [address], [place], [place] [address]   Resp Panel by RT-PCR (Flu A&B, Covid) Nasopharyngeal Swab     Status: None   Collection Time: 10/09/20  4:38 PM   Specimen: Nasopharyngeal Swab; Nasopharyngeal(NP) swabs in vial transport medium  Result Value Ref Range   SARS Coronavirus 2 by RT PCR NEGATIVE NEGATIVE    Comment: (NOTE) SARS-CoV-2 target nucleic acids are NOT DETECTED.  The SARS-CoV-2 RNA is generally detectable in upper respiratory specimens during the acute phase of infection. The lowest concentration of SARS-CoV-2 viral copies this assay can detect is 138 copies/mL. A negative result does not preclude  SARS-Cov-2 infection and should not be used as the sole basis for treatment or other patient management decisions. A negative result may occur with  improper specimen collection/handling, submission of specimen other than nasopharyngeal swab, presence of viral mutation(s) within the areas targeted by this assay, and inadequate number of viral copies(<138 copies/mL). A negative result must be combined with clinical observations, patient history, and epidemiological information. The expected result is Negative.  Fact Sheet for Patients:  EntrepreneurPulse.com.au  Fact Sheet for Healthcare Providers:  IncredibleEmployment.be  This test is no t yet approved or cleared by the [place] FDA and  has been authorized for detection and/or diagnosis of SARS-CoV-2 by FDA under an Emergency Use Authorization (EUA). This EUA will remain  in effect (meaning this test can be used) for the duration of the COVID-19 declaration under Section 564(b)(1) of the Act, 21 U.S.C.section 360bbb-3(b)(1), unless the authorization is terminated  or revoked sooner.       Influenza A by PCR NEGATIVE NEGATIVE   Influenza B by PCR NEGATIVE NEGATIVE    Comment: (NOTE) The Xpert Xpress SARS-CoV-2/FLU/RSV plus assay is intended as an aid in the diagnosis of influenza from Nasopharyngeal swab specimens and should not be used as a sole basis for treatment. Nasal washings and aspirates are unacceptable for Xpert Xpress SARS-CoV-2/FLU/RSV testing.  Fact Sheet for Patients: EntrepreneurPulse.com.au  Fact Sheet for Healthcare Providers: IncredibleEmployment.be  This test is not yet approved or  cleared by the [place] and has been authorized for detection and/or diagnosis of SARS-CoV-2 by FDA under an Emergency Use Authorization (EUA). This EUA will remain in effect (meaning this test can be used) for the duration of the COVID-19  declaration under Section 564(b)(1) of the Act, 21 U.S.C. section 360bbb-3(b)(1), unless the authorization is terminated or revoked.  Performed at [facility], [place] [address], [place], [place] [address]     Medications:  Current Facility-Administered Medications  Medication Dose Route Frequency Provider Last Rate Last Admin   LORazepam (ATIVAN) injection 1 mg  1 mg Intramuscular Once Lucrezia Starch, MD       LORazepam (ATIVAN) injection 1 mg  1 mg Intramuscular Q6H PRN Lucrezia Starch, MD   1 mg at 10/09/20 2345   OLANZapine zydis (ZYPREXA) disintegrating tablet 10 mg  10 mg Oral BID Suella Broad, FNP   10 mg at 10/11/20 0803   ziprasidone (GEODON) injection 20 mg  20 mg Intramuscular PRN Lucrezia Starch, MD   20 mg at 10/11/20 0041   Current Outpatient Medications  Medication Sig Dispense Refill   divalproex (DEPAKOTE ER) 250 MG 24 hr tablet Take 1 tablet (250 mg total) by mouth every morning. 30 tablet 0   divalproex (DEPAKOTE ER) 500 MG 24 hr tablet Take 1 tablet (500 mg total) by mouth at bedtime. 30 tablet 0   hydrocerin (EUCERIN) CREA Apply 1 application topically 2 (two) times daily. (Patient not taking: No sig reported)  0   metoprolol succinate (TOPROL-XL) 25 MG 24 hr tablet Take 1 tablet (25 mg total) by mouth daily. (Patient not taking: Reported on 10/09/2020) 30 tablet 0   OLANZapine (ZYPREXA) 15 MG tablet Take 1 tablet (15 mg total) by mouth daily. 30 tablet 0   OLANZapine zydis (ZYPREXA) 15 MG disintegrating tablet Take 1 tablet (15 mg total) by mouth once for 1 dose. 1 tablet 0   Prenatal Vit-Fe Fumarate-FA (PRENATAL MULTIVITAMIN) TABS tablet Take 1 tablet by mouth daily at 12 noon. (Patient not taking: No sig reported) 30 tablet 0   spironolactone (ALDACTONE) 25 MG tablet Take 0.5 tablets (12.5 mg total) by mouth daily. (Patient not taking: Reported on 10/09/2020) 15 tablet 0    Musculoskeletal: Strength & Muscle Tone: within normal  limits Gait & Station: normal Patient leans: N/A    Psychiatric Specialty Exam:  Presentation  General Appearance: Appropriate for Environment; Casual  Eye Contact:Fair  Speech:Clear and Coherent; Normal Rate  Speech Volume:Normal  Handedness:Right   Mood and Affect  Mood:Anxious; Irritable  Affect:Labile; Full Range   Thought Process  Thought Processes:Disorganized; Irrevelant  Descriptions of Associations:Loose  Orientation:Partial  Thought Content:Tangential; Scattered; Perseveration; Paranoid Ideation  History of Schizophrenia/Schizoaffective disorder:Yes  Duration of Psychotic Symptoms:Greater than six months  Hallucinations:Hallucinations: None  Ideas of Reference:Paranoia  Suicidal Thoughts:Suicidal Thoughts: No  Homicidal Thoughts:Homicidal Thoughts: No   Sensorium  Memory:Remote Poor; Immediate Poor; Recent Poor  Judgment:Poor  Insight:Shallow   Executive Functions  Concentration:Fair  Attention Span:Poor  Recall:Poor  Fund of Knowledge:Fair  Language:Fair   Psychomotor Activity  Psychomotor Activity:Psychomotor Activity: Normal   Assets  Assets:Communication Skills; Leisure Time; Physical Health; Desire for Improvement; Housing   Sleep  Sleep:Sleep: Fair    Physical Exam: Physical Exam Vitals and nursing note reviewed. Exam conducted with a chaperone present.  Constitutional:      General: She is not in acute distress.    Appearance: Normal appearance. She is not ill-appearing.  HENT:     Head: Normocephalic.  Cardiovascular:     Rate and Rhythm: Normal rate.  Pulmonary:     Effort: Pulmonary effort is normal.  Neurological:     Mental Status: She is alert.     Comments: Oriented to person and place   Psychiatric:        Attention and Perception: Attention normal.        Mood and Affect: Mood is anxious. Affect is labile.        Speech: Speech is rapid and pressured.        Behavior: Behavior is  cooperative.        Thought Content: Thought content is paranoid and delusional. Thought content does not include homicidal or suicidal ideation.        Judgment: Judgment is impulsive.   Review of Systems  Constitutional: Negative.   HENT: Negative.    Eyes: Negative.   Respiratory: Negative.    Cardiovascular: Negative.   Gastrointestinal: Negative.   Genitourinary: Negative.   Musculoskeletal: Negative.   Skin: Negative.   Neurological: Negative.   Endo/Heme/Allergies: Negative.   Psychiatric/Behavioral:  Positive for depression (Stable). Hallucinations: Denies. Suicidal ideas: Denies.The patient is nervous/anxious (St[place]) and has insomnia.   Blood pressure 121/85, pulse (!) 105, temperature 98.8 F (37.1 C), temperature source Oral, resp. rate 18, SpO2 94 %, unknown if currently breastfeeding. There is no height or weight on file to calculate BMI.  Treatment Plan Summary: Daily contact with patient to assess and evaluate symptoms and progress in treatment, Medication management, and Plan Psychiatric hospitalization Continue to meet criteria for inpatient psychiatric treatment  Medication Management: Continue Zyprexa 10 mg po BID, she has had some improvement in her clinical symptoms. Goal is to continue with olanzapine for quicker stabilization -Order urine studies to include pregnancy test.    Disposition: Recommend psychiatric Inpatient admission when medically cleared.  Suella Broad, FNP 10/11/2020 10:15 AM

## 2020-10-11 NOTE — ED Notes (Signed)
Pt alert at this time. No aggression noted at this time.  Needy. Disorganized.

## 2020-10-11 NOTE — BH Assessment (Addendum)
[place] Assessment Progress Note   Per Sheran Fava, NP, this pt requires psychiatric hospitalization at this time.  Pt presents under IVC initiated by Earleen Newport, NP.  Under the direction of Hampton Abbot, MD this writer has sought placement for pt at facilities outside of the [facility] system.  The following facilities have been contacted to seek placement for this pt, with results as noted:  Beds available, information sent, decision pending: [facility]  At capacity: [facility]   Jalene Mullet, [place] Coordinator [phone]
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
## 2020-10-11 NOTE — ED Notes (Signed)
Pt was agitated, escalating behaviors, leaving room, continuously asking to leave, difficult to redirect.   Threatening and yelled at staff PRN Geodon 20 mg IM given

## 2020-10-11 NOTE — ED Notes (Signed)
Pt has been calm this afternoon.

## 2020-10-11 NOTE — ED Provider Notes (Signed)
Emergency Medicine Observation Re-evaluation Note  Dail Meece is a 40 y.o. female, seen on rounds today.  Pt initially presented to the ED for complaints of Medical Clearance Currently, the patient is agitated.  Physical Exam  BP 121/85 (BP Location: Right Arm)   Pulse (!) 105   Temp 98.8 F (37.1 C) (Oral)   Resp 18   SpO2 94%  Physical Exam General: No acute distress Cardiac: Well-perfused Lungs: Nonlabored Psych: Agitated  ED Course / MDM  EKG:   I have reviewed the labs performed to date as well as medications administered while in observation.  Recent changes in the last 24 hours include behavioral health evaluation.  Plan  Current plan is for inpatient. Pessy Delamar is under involuntary commitment.      Hayden Rasmussen, MD 10/11/20 [phone]

## 2020-10-12 DIAGNOSIS — F250 Schizoaffective disorder, bipolar type: Secondary | ICD-10-CM | POA: Diagnosis not present

## 2020-10-12 MED ORDER — RISPERIDONE 0.5 MG PO TBDP: 2.0000 mg | ORAL_TABLET | Freq: Three times a day (TID) | ORAL | Status: DC | PRN

## 2020-10-12 MED ORDER — ZIPRASIDONE MESYLATE 20 MG IM SOLR
20.0000 mg | INTRAMUSCULAR | Status: AC | PRN
  Administered 2020-10-13: 20 mg via INTRAMUSCULAR
  Filled 2020-10-12: qty 20

## 2020-10-12 NOTE — ED Notes (Signed)
Pt becoming aggressive , meds given per mar
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
## 2020-10-12 NOTE — ED Notes (Signed)
Due to pt behaviors vitals to be obtained when pt wakes. Respirations even and unlabored

## 2020-10-12 NOTE — ED Provider Notes (Signed)
Emergency Medicine Observation Re-evaluation Note  [name] [name] is a 40 y.o. female, seen on rounds today.  Pt initially presented to the ED for complaints of Medical Clearance Currently, the patient is resting in bed.  Physical Exam  BP (!) 131/92 (BP Location: Right Arm)   Pulse (!) 102   Temp (!) 97.5 F (36.4 C) (Oral)   Resp 18   SpO2 98%  Physical Exam General: Nontoxic appearance Cardiac: Mild tachycardia Lungs: Normal respiratory rate Psych: Calm  ED Course / MDM  EKG:   I have reviewed the labs performed to date as well as medications administered while in observation.  Recent changes in the last 24 hours include continues to be intermittently aggressive and violent.  Plan  Current plan is for psychiatric admission. [name] [name] is under involuntary commitment.      Daleen Bo, MD 10/12/20 1102

## 2020-10-13 DIAGNOSIS — F23 Brief psychotic disorder: Secondary | ICD-10-CM | POA: Diagnosis not present

## 2020-10-13 DIAGNOSIS — F250 Schizoaffective disorder, bipolar type: Secondary | ICD-10-CM | POA: Diagnosis not present

## 2020-10-13 MED ORDER — LORAZEPAM 1 MG PO TABS
1.0000 mg | ORAL_TABLET | ORAL | Status: AC | PRN
  Administered 2020-10-13: 1 mg via ORAL
  Filled 2020-10-13: qty 1

## 2020-10-13 MED ORDER — ZIPRASIDONE MESYLATE 20 MG IM SOLR
20.0000 mg | INTRAMUSCULAR | Status: AC | PRN
  Administered 2020-10-13: 20 mg via INTRAMUSCULAR
  Filled 2020-10-13: qty 20

## 2020-10-13 MED ORDER — RISPERIDONE 0.5 MG PO TBDP
2.0000 mg | ORAL_TABLET | Freq: Three times a day (TID) | ORAL | Status: DC | PRN
  Administered 2020-10-13: 2 mg via ORAL
  Filled 2020-10-13: qty 4

## 2020-10-13 MED ORDER — OLANZAPINE 10 MG PO TBDP: 10.0000 mg | ORAL_TABLET | Freq: Two times a day (BID) | ORAL | Status: DC

## 2020-10-13 NOTE — ED Notes (Signed)
Pt woke up wanting her zyprexa, gave her medication, then she wanted to take a shower, tech assisted with shower
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
## 2020-10-13 NOTE — Progress Notes (Signed)
Per [facility], patient meets criteria for inpatient treatment. There are no available or appropriate beds at [facility] today. CSW faxed referrals to the following facilities for review:  [facility] [address] [place], [place] [address] [phone] --  [place] [address] [place] [address] [phone] --  [facility]  Pending - Request Sent N/A [address] [place] [address] [phone] --  [place] N/A [place] [place] [address] [place] --  [facility]  Pending - Request Sent N/A [place], [place] [address] [phone] --  [facility]  Pending - No Request Sent N/A [address] [place] [address] [phone] --  [place]  Pending - No Request Sent N/A [address] [place] [address] [phone] --  [facility]  Pending - No Request [address] [place] [address] [phone] --  [facility]  Pending - No Request Sent N/A [address] [place] [address] [phone] --  [facility]  Pending - No Request Sent N/A [address], [place] [address] [phone] [facility]  Pending - No Request Sent N/A [address] [name] [address] [phone] --  [facility]  Pending - No Request Sent N/A [place] [place] [address] [phone] --  [place]  Pending - No Request Sent N/A [address] [place] [address] [phone] --  [place]  Pending - No Request  Sent N/A [address], [place] [address] [phone] --  [facility]  Pending - No Request Sent N/A [address] [place] [address] [phone] --  [facility]  Pending - No Request Sent N/A [address], [place] [address] [phone] --  [place]  Pending - No Request Sent N/A [address] [place] [address] [phone] --  [facility]  Pending - No Request Sent N/A [address], [place] [address] [phone] --  [facility]  Pending - No Request Sent N/A [address] [place] [address] [phone] --  [place]  Pending - No Request Sent N/A [place] [address] [phone]    TTS will continue to seek bed placement.  Glennie Isle, MSW, [place], LCAS-A Phone: [phone] Disposition/TOC

## 2020-10-13 NOTE — ED Notes (Signed)
Pt becoming aggressive, meds given per mar

## 2020-10-13 NOTE — ED Notes (Signed)
Pt appears to be agiaited at time. Wi

## 2020-10-13 NOTE — ED Notes (Signed)
The pt  continues to come to the door, speaking and asking questions in rapid pace and tone, she is easily redirected. I will continue to monitor.

## 2020-10-13 NOTE — ED Provider Notes (Signed)
Emergency Medicine Observation Re-evaluation Note  [name] [name] is a 40 y.o. female, seen on rounds today.  Pt initially presented to the ED for complaints of Medical Clearance Currently, the patient is resting.  Physical Exam  BP (!) 143/98 (BP Location: Right Arm)   Pulse 96   Temp 98.2 F (36.8 C) (Oral)   Resp 18   SpO2 100%  Physical Exam General: resting comfortably, NAD Lungs: normal WOB Psych: currently calm and resting  ED Course / MDM  EKG:EKG Interpretation  Date/Time:  Friday October 12 2020 14:22:52 EDT Ventricular Rate:  101 PR Interval:  156 QRS Duration: 76 QT Interval:  336 QTC Calculation: 435 R Axis:   50 Text Interpretation: Sinus tachycardia Nonspecific T wave abnormality Abnormal ECG since last tracing no significant change Confirmed by Daleen Bo [phone]) on 10/12/2020 4:27:24 PM  I have reviewed the labs performed to date as well as medications administered while in observation.  Recent changes in the last 24 hours include none.  Plan  Current plan is for inpatient psychiatric care. Janique Hoefer is under involuntary commitment.      Lorelle Gibbs, [place] 10/13/20 6045

## 2020-10-13 NOTE — ED Notes (Signed)
Pt is wanting to leave and wants to speak to the doctor.

## 2020-10-13 NOTE — ED Notes (Signed)
Pt becoming increasingly agitated, meds given per mar

## 2020-10-13 NOTE — Progress Notes (Signed)
CSW followed up with Roderic Palau with [facility]. Patient is under review.  Glennie Isle, MSW, [place], LCAS-A Phone: [phone] Disposition/TOC

## 2020-10-13 NOTE — ED Notes (Signed)
Pt has become very aggressive and verbally abusive. Pt is taking clothes of and pulling alarms. Prn meds given per mar

## 2020-10-14 DIAGNOSIS — F250 Schizoaffective disorder, bipolar type: Secondary | ICD-10-CM | POA: Diagnosis not present

## 2020-10-14 DIAGNOSIS — F23 Brief psychotic disorder: Secondary | ICD-10-CM | POA: Diagnosis not present

## 2020-10-14 MED ORDER — RISPERIDONE 0.5 MG PO TBDP
2.0000 mg | ORAL_TABLET | Freq: Three times a day (TID) | ORAL | Status: DC | PRN
  Administered 2020-10-14 – 2020-10-16 (×3): 2 mg via ORAL
  Filled 2020-10-14 (×4): qty 4

## 2020-10-14 MED ORDER — HALOPERIDOL LACTATE 5 MG/ML IJ SOLN: 5.0000 mg | Freq: Three times a day (TID) | INTRAMUSCULAR | Status: DC | PRN

## 2020-10-14 MED ORDER — ZIPRASIDONE MESYLATE 20 MG IM SOLR
20.0000 mg | INTRAMUSCULAR | Status: AC | PRN
  Administered 2020-10-15: 20 mg via INTRAMUSCULAR
  Filled 2020-10-14: qty 20

## 2020-10-14 MED ORDER — DIVALPROEX SODIUM 500 MG PO DR TAB
500.0000 mg | DELAYED_RELEASE_TABLET | Freq: Two times a day (BID) | ORAL | Status: DC
  Administered 2020-10-14 – 2020-10-17 (×7): 500 mg via ORAL
  Filled 2020-10-14 (×7): qty 1

## 2020-10-14 MED ORDER — OLANZAPINE 5 MG PO TBDP
5.0000 mg | ORAL_TABLET | Freq: Two times a day (BID) | ORAL | Status: AC
  Administered 2020-10-14 – 2020-10-15 (×2): 5 mg via ORAL
  Filled 2020-10-14 (×2): qty 1

## 2020-10-14 MED ORDER — DIPHENHYDRAMINE HCL 25 MG PO CAPS
ORAL_CAPSULE | ORAL | Status: AC
  Administered 2020-10-14: 25 mg
  Filled 2020-10-14: qty 1

## 2020-10-14 MED ORDER — LORAZEPAM 1 MG PO TABS
1.0000 mg | ORAL_TABLET | ORAL | Status: AC | PRN
  Administered 2020-10-15: 1 mg via ORAL
  Filled 2020-10-14: qty 1

## 2020-10-14 MED ORDER — DIVALPROEX SODIUM 250 MG PO DR TAB: 250.0000 mg | DELAYED_RELEASE_TABLET | Freq: Two times a day (BID) | ORAL | Status: DC

## 2020-10-14 MED ORDER — STERILE WATER FOR INJECTION IJ SOLN
INTRAMUSCULAR | Status: AC
  Administered 2020-10-15: 10 mL
  Filled 2020-10-14: qty 10

## 2020-10-14 MED ORDER — DIPHENHYDRAMINE HCL 25 MG PO CAPS
25.0000 mg | ORAL_CAPSULE | Freq: Once | ORAL | Status: AC
  Administered 2020-10-14: 25 mg via ORAL

## 2020-10-14 MED ORDER — PALIPERIDONE ER 3 MG PO TB24
3.0000 mg | ORAL_TABLET | Freq: Two times a day (BID) | ORAL | Status: AC
  Administered 2020-10-14 – 2020-10-16 (×4): 3 mg via ORAL
  Filled 2020-10-14 (×4): qty 1

## 2020-10-14 MED ORDER — BENZTROPINE MESYLATE 1 MG PO TABS
1.0000 mg | ORAL_TABLET | Freq: Two times a day (BID) | ORAL | Status: DC | PRN
  Administered 2020-10-14: 1 mg via ORAL
  Filled 2020-10-14: qty 1

## 2020-10-14 MED ORDER — HALOPERIDOL 5 MG PO TABS
5.0000 mg | ORAL_TABLET | Freq: Three times a day (TID) | ORAL | Status: DC | PRN
  Administered 2020-10-14: 5 mg via ORAL
  Filled 2020-10-14: qty 1

## 2020-10-14 MED ORDER — PALIPERIDONE PALMITATE ER 234 MG/1.5ML IM SUSY
234.0000 mg | PREFILLED_SYRINGE | Freq: Once | INTRAMUSCULAR | Status: AC
  Administered 2020-10-16: 234 mg via INTRAMUSCULAR
  Filled 2020-10-14: qty 1.5

## 2020-10-14 MED ORDER — BENZTROPINE MESYLATE 1 MG/ML IJ SOLN: 1.0000 mg | Freq: Two times a day (BID) | INTRAMUSCULAR | Status: DC | PRN

## 2020-10-14 MED ORDER — ACETAMINOPHEN 325 MG PO TABS
650.0000 mg | ORAL_TABLET | Freq: Once | ORAL | Status: DC
  Filled 2020-10-14: qty 2

## 2020-10-14 NOTE — ED Notes (Signed)
Pt continues to need redirection.  Pushing emergency alarms several times.  Difficult redirect. Disorganized. Delusional, grandiose.
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
## 2020-10-14 NOTE — ED Provider Notes (Signed)
Emergency Medicine Observation Re-evaluation Note  [name] [name] is a 40 y.o. female, seen on rounds today.  Pt initially presented to the ED for complaints of Medical Clearance Currently, the patient is standing at the doorway, asking to be released.  Physical Exam  BP (!) 150/110 (BP Location: Right Arm)   Pulse (!) 113   Temp (!) 97.4 F (36.3 C) (Oral)   Resp (!) 22 Comment: patient was doing jumping jacks  SpO2 100%  Physical Exam General: Awake and alert Lungs: Resp even and unlabored Psych: Calm but restless  ED Course / MDM  EKG:EKG Interpretation  Date/Time:  Friday October 12 2020 14:22:52 EDT Ventricular Rate:  101 PR Interval:  156 QRS Duration: 76 QT Interval:  336 QTC Calculation: 435 R Axis:   50 Text Interpretation: Sinus tachycardia Nonspecific T wave abnormality Abnormal ECG since last tracing no significant change Confirmed by Daleen Bo [phone]) on 10/12/2020 4:27:24 PM  I have reviewed the labs performed to date as well as medications administered while in observation.  Recent changes in the last 24 hours include None.  Plan  Current plan is for Inpatient psych admission. [name] [name] is under involuntary commitment.      Truddie Hidden, MD 10/14/20 [phone]

## 2020-10-14 NOTE — ED Notes (Signed)
Pt go her dinner tray

## 2020-10-14 NOTE — ED Notes (Signed)
Pt was agitated. Pt continuously coming out of room saying random delusional thoughts. Difficult to redirect. Grandiose. Pt yelling at staff, continues to escalate and  talking about going home.   Labile with thoughts and emotions. PRN Haldol 5 mg PO given (see MAR), partially effective.

## 2020-10-15 DIAGNOSIS — F250 Schizoaffective disorder, bipolar type: Secondary | ICD-10-CM | POA: Diagnosis not present

## 2020-10-15 MED ORDER — DIPHENHYDRAMINE HCL 50 MG/ML IJ SOLN
50.0000 mg | Freq: Once | INTRAMUSCULAR | Status: AC
  Administered 2020-10-15: 50 mg via INTRAMUSCULAR
  Filled 2020-10-15: qty 1

## 2020-10-15 NOTE — ED Notes (Addendum)
Pt has taken her scrubs off and coming out of room stating she was told she would have a ride today. Pt continuing to talk erratically saying she is a celebrity and she is the original [place] and we are holding her against her well. Pt asking to speak with a lawyer. Pt is asked to return to her room and close the door.

## 2020-10-15 NOTE — Progress Notes (Signed)
Continue recommendation of inpatient psychiatric care per Oneida Alar, NP.  Denied by [facility] at this time. Patient referred out again. See original not written by Glennie Isle, LCSWA for full list of facilities. Situation ongoing, CSW will continue to monitor and update note as more information becomes available.   Signed:  Durenda Hurt, MSW, [place], LCASA 10/15/2020 9:37 AM

## 2020-10-15 NOTE — ED Provider Notes (Signed)
Emergency Medicine Observation Re-evaluation Note  [name] [name] is a 40 y.o. female, seen on rounds today.  Pt initially presented to the ED for complaints of Medical Clearance Currently, the patient is sleeping.  Physical Exam  BP (!) 144/125 (BP Location: Right Arm) Comment: RN is aware of pt BP  Pulse (!) 130 Comment: patient agitated.  Temp 98 F (36.7 C) (Oral)   Resp 18   SpO2 100%  Physical Exam General: No distress Lungs: Resp even and unlabored Psych: Sleeping  ED Course / MDM  EKG:EKG Interpretation  Date/Time:  Friday October 12 2020 14:22:52 EDT Ventricular Rate:  101 PR Interval:  156 QRS Duration: 76 QT Interval:  336 QTC Calculation: 435 R Axis:   50 Text Interpretation: Sinus tachycardia Nonspecific T wave abnormality Abnormal ECG since last tracing no significant change Confirmed by Daleen Bo [phone]) on 10/12/2020 4:27:24 PM  I have reviewed the labs performed to date as well as medications administered while in observation.  Recent changes in the last 24 hours include none.  Plan  Current plan is for inpatient psych admission. [name] [name] is under involuntary commitment.      Truddie Hidden, MD 10/15/20 [phone]
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
## 2020-10-15 NOTE — ED Notes (Signed)
Pt becoming more and more agitated. MD made aware.

## 2020-10-15 NOTE — ED Notes (Signed)
Patient was agitated and difficult to redirect.  Will defer vitals until she is awake.

## 2020-10-15 NOTE — ED Notes (Signed)
Throughout shift pts behavior changed from being calm, talkative, talking erratic back to calm. Pt was able to complete all of her ADLs alone.

## 2020-10-15 NOTE — ED Notes (Signed)
Pt coming out of room asking is any one going to help her get out of this place, she needs to get out of here.

## 2020-10-15 NOTE — BH Assessment (Signed)
Patient was seen this date to assess current mental health status. Patient contnues to be delusional and speaks at length in reference to her "being on the red carpet with all the stars." Patient was difficult to redirect and is recommended for a continued inpatient admission per [facility] NP as bed placement is investigated.

## 2020-10-15 NOTE — ED Notes (Addendum)
Pt continuing to come out room talking erratic. Pt asking to see her pastor.

## 2020-10-15 NOTE — ED Notes (Signed)
Patient is intrusive with tangential speech.  She is difficult to redirect and is not responding well to her current medication regimen.  Patient given 20 mg Geodon IM 4 hours ago and has not rested.  Her behavior is now beginning to escalate as she is yelling "help" and requesting to discharge.

## 2020-10-16 ENCOUNTER — Other Ambulatory Visit: Payer: Self-pay

## 2020-10-16 ENCOUNTER — Other Ambulatory Visit (HOSPITAL_COMMUNITY): Payer: Self-pay | Admitting: Family

## 2020-10-16 DIAGNOSIS — F250 Schizoaffective disorder, bipolar type: Secondary | ICD-10-CM | POA: Diagnosis not present

## 2020-10-16 MED ORDER — LORAZEPAM 1 MG PO TABS
1.0000 mg | ORAL_TABLET | Freq: Four times a day (QID) | ORAL | Status: DC | PRN
  Administered 2020-10-16 – 2020-10-19 (×2): 1 mg via ORAL
  Filled 2020-10-16 (×4): qty 1

## 2020-10-16 NOTE — BH Assessment (Addendum)
TTS TO REASSESS:   @ 1924, Attempted to complete patient's assessment. Per nursing (Junior), patient is sedated and unable to be seen.   @2306 , followed up with patient's nurse for patient's tele assessment, Junior, RN whom states that patient may still be sleeping, she was given night meds, Junior RN will check to see if patient is awake, and able to be seen will notify TTS.  Pt on [facility] wait list.  @2317  informed by Junior, RN, that patient refused to respond to nursing in their attempts to wake her up.

## 2020-10-16 NOTE — ED Notes (Signed)
Throughout shift pt has had many mood swings. Pt has been calm, talktive, talking erratic and back calm. Pt laying in bed calm at this time. Pt was able to complete ADLs herself.
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
## 2020-10-16 NOTE — ED Notes (Addendum)
Pt coming out of room saying "Donnald Garre been captured here against my will." Pt informed why she is here and instructed to close door due to other patient starting to get agitated with her outburst.

## 2020-10-16 NOTE — ED Notes (Signed)
Pt is given a sandwich, orange juice and crackers.

## 2020-10-16 NOTE — ED Provider Notes (Signed)
Emergency Medicine Observation Re-evaluation Note  [name] [name] is a 40 y.o. female, seen on rounds today.  Pt initially presented to the ED for complaints of Medical Clearance Currently, the patient is awaiting behavioral health placement patient's IVC.  Physical Exam  BP (!) 141/114 (BP Location: Right Arm)   Pulse (!) 127   Temp 97.6 F (36.4 C) (Oral)   Resp 20   Ht 1.626 m (5\' 4" )   Wt 77 kg   SpO2 98%   BMI 29.14 kg/m  Physical Exam General: Nontoxic no acute distress. Cardiac: RRR Lungs: No respiratory distress Psych: Baseline  ED Course / MDM  EKG:EKG Interpretation  Date/Time:  Friday October 12 2020 14:22:52 EDT Ventricular Rate:  101 PR Interval:  156 QRS Duration: 76 QT Interval:  336 QTC Calculation: 435 R Axis:   50 Text Interpretation: Sinus tachycardia Nonspecific T wave abnormality Abnormal ECG since last tracing no significant change Confirmed by Daleen Bo [phone]) on 10/12/2020 4:27:24 PM  I have reviewed the labs performed to date as well as medications administered while in observation.  Recent changes in the last 24 hours include no significant change.  Plan  Current plan is for IVC awaiting placement. Genora Arp is under involuntary commitment.      Fredia Sorrow, MD 10/16/20 1052

## 2020-10-17 DIAGNOSIS — F259 Schizoaffective disorder, unspecified: Secondary | ICD-10-CM | POA: Diagnosis not present

## 2020-10-17 DIAGNOSIS — F250 Schizoaffective disorder, bipolar type: Secondary | ICD-10-CM | POA: Diagnosis not present

## 2020-10-17 LAB — VALPROIC ACID LEVEL: Valproic Acid Lvl: 65 ug/mL (ref 50.0–100.0)

## 2020-10-17 MED ORDER — METOPROLOL SUCCINATE ER 50 MG PO TB24
25.0000 mg | ORAL_TABLET | Freq: Every day | ORAL | Status: DC
  Administered 2020-10-18 – 2020-10-19 (×2): 25 mg via ORAL
  Filled 2020-10-17 (×3): qty 1

## 2020-10-17 MED ORDER — POLYETHYLENE GLYCOL 3350 17 G PO PACK
17.0000 g | PACK | Freq: Every day | ORAL | Status: DC
  Administered 2020-10-18 – 2020-10-19 (×2): 17 g via ORAL
  Filled 2020-10-17 (×2): qty 1

## 2020-10-17 MED ORDER — DIVALPROEX SODIUM 500 MG PO DR TAB
750.0000 mg | DELAYED_RELEASE_TABLET | Freq: Every day | ORAL | Status: DC
  Administered 2020-10-19: 750 mg via ORAL
  Filled 2020-10-17: qty 1

## 2020-10-17 MED ORDER — DIVALPROEX SODIUM 500 MG PO DR TAB
500.0000 mg | DELAYED_RELEASE_TABLET | Freq: Every morning | ORAL | Status: DC
  Administered 2020-10-18 – 2020-10-19 (×2): 500 mg via ORAL
  Filled 2020-10-17 (×2): qty 1

## 2020-10-17 MED ORDER — OLANZAPINE 10 MG PO TBDP
10.0000 mg | ORAL_TABLET | Freq: Two times a day (BID) | ORAL | Status: DC
  Administered 2020-10-17 – 2020-10-19 (×5): 10 mg via ORAL
  Filled 2020-10-17 (×5): qty 1

## 2020-10-17 NOTE — ED Notes (Signed)
Pt has been cooperative this shift but using a hostile tone when speaking to staff. Continues to fixate on discharge, pastor, car, medications.

## 2020-10-17 NOTE — ED Provider Notes (Signed)
Emergency Medicine Observation Re-evaluation Note  [name] [name] is a 40 y.o. female, seen on rounds today.  Pt initially presented to the ED for complaints of delusional behavior.  Currently, the patient is pending TTS evaluation  Physical Exam  BP 113/82 (BP Location: Right Arm)   Pulse (!) 127   Temp (!) 97.4 F (36.3 C) (Oral)   Resp 20   Ht 5\' 4"  (1.626 m)   Wt 77 kg   SpO2 98%   BMI 29.14 kg/m  Physical Exam General: NAD Cardiac: Tachycardic Lungs: No respiratory distress Psych: Stable  ED Course / MDM  EKG:EKG Interpretation  Date/Time:  Friday October 12 2020 14:22:52 EDT Ventricular Rate:  101 PR Interval:  156 QRS Duration: 76 QT Interval:  336 QTC Calculation: 435 R Axis:   50 Text Interpretation: Sinus tachycardia Nonspecific T wave abnormality Abnormal ECG since last tracing no significant change Confirmed by Daleen Bo [phone]) on 10/12/2020 4:27:24 PM  I have reviewed the labs performed to date as well as medications administered while in observation.    Plan  Current plan is for recheck basic labs - patient initially hypernatremia with Na 146 on arrival, will reassess today with labs.  She is also noted to be persistently tachycardic, it appears her home metoprolol had not been ordered yet, but I ordered this morning Toprol 25 mg XL  [name] [name] is under involuntary commitment.      Wyvonnia Dusky, MD 10/17/20 [phone]

## 2020-10-17 NOTE — ED Notes (Signed)
Pt to doorway requesting brush. Comb offered. Pt states "I already have a comb. You all have been keeping me here without my covering, it's unfair. I shouldn't be walking around naked before you." Pt has remained clothed in front of staff at all times.
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
## 2020-10-17 NOTE — ED Notes (Signed)
Patient refused Vital signs again this morning.
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
## 2020-10-17 NOTE — ED Notes (Signed)
Pt allowed this writer to draw blood. Calm and cooperative, however pressured speech regarding her car and wanting to go back to [facility] to get car. Pt anxious to 'get out of here. I'm not crazy.' Informed TTS pt willing to talk, will get update around 0930 regarding TTS v provider assessment.

## 2020-10-17 NOTE — ED Notes (Signed)
Pt refused metoprolol and PRN anxiety meds. Loudly stated "I only take depakote. You need to call the pastor so he can take me home where my meds are." Pt again left hone number note on napkin.
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
## 2020-10-17 NOTE — Progress Notes (Signed)
Patient refuse VS Will check in the morning

## 2020-10-17 NOTE — Progress Notes (Signed)
Patient refused TTS Eval tonight.

## 2020-10-18 DIAGNOSIS — F250 Schizoaffective disorder, bipolar type: Secondary | ICD-10-CM | POA: Diagnosis not present

## 2020-10-18 LAB — RESP PANEL BY RT-PCR (FLU A&B, COVID) ARPGX2
Influenza A by PCR: NEGATIVE
Influenza B by PCR: NEGATIVE
SARS Coronavirus 2 by RT PCR: NEGATIVE

## 2020-10-18 MED ORDER — STERILE WATER FOR INJECTION IJ SOLN
INTRAMUSCULAR | Status: AC
  Filled 2020-10-18: qty 10

## 2020-10-18 MED ORDER — OLANZAPINE 10 MG IM SOLR
10.0000 mg | Freq: Three times a day (TID) | INTRAMUSCULAR | Status: DC | PRN
  Filled 2020-10-18: qty 10

## 2020-10-18 NOTE — ED Notes (Signed)
Pt pressing call bell multiple times. Continues to ask for prescriptions so she can leave and go back to [place]. Demanding to speak to a doctor for 'a second opinion'.

## 2020-10-18 NOTE — ED Notes (Signed)
Pt refuse her vital signs

## 2020-10-18 NOTE — ED Notes (Signed)
Pt was given [place] sandwich, two cheese sticks, 2 pack of crackers and a  cup sprite
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
## 2020-10-18 NOTE — Progress Notes (Signed)
Follow-up with [facility] for bed offer acceptance tomorrow 10/18/20 PENDING the following requested documents...  CSW called and spoke with Stanton Kidney from [facility] [phone] who advised that pt does have an available bed for tomorrow 10/18/20 pending the following information: Current IVC paperwork, COVID-19; negative test result within 48 hours, MAR and vitals from last 5 days, and MD note within the last 5 days. CSW advised that CSW would share with 1st shift, Jalene Mullet to follow and provide the following documents via fax.   Benjaman Kindler, MSW, LCSWA 10/18/2020 10:15 PM

## 2020-10-18 NOTE — ED Provider Notes (Signed)
Emergency Medicine Observation Re-evaluation Note  [name] [name] is a 40 y.o. female, seen on rounds today.  Pt initially presented to the ED for complaints of Medical Clearance Currently, the patient is awaiting placement  Physical Exam  BP 123/80 (BP Location: Right Arm)   Pulse (!) 111   Temp (!) 97.5 F (36.4 C) (Oral)   Resp 20   Ht 5\' 4"  (1.626 m)   Wt 77 kg   SpO2 100%   BMI 29.14 kg/m  Physical Exam General: calm Cardiac: slightly tachycardic Lungs: no increased WOB Psych: calm  ED Course / MDM  EKG:EKG Interpretation  Date/Time:  Friday October 12 2020 14:22:52 EDT Ventricular Rate:  101 PR Interval:  156 QRS Duration: 76 QT Interval:  336 QTC Calculation: 435 R Axis:   50 Text Interpretation: Sinus tachycardia Nonspecific T wave abnormality Abnormal ECG since last tracing no significant change Confirmed by Daleen Bo [phone]) on 10/12/2020 4:27:24 PM  I have reviewed the labs performed to date as well as medications administered while in observation.  Recent changes in the last 24 hours include none.  Plan  Current plan is for inpatient treatment. [name] [name] is under involuntary commitment.      Malvin Johns, MD 10/18/20 [phone]

## 2020-10-19 DIAGNOSIS — F250 Schizoaffective disorder, bipolar type: Secondary | ICD-10-CM | POA: Diagnosis not present

## 2020-10-19 NOTE — ED Notes (Signed)
Pt  came out of her room  ask for a shower. pt was told she can take a shower in the  morning. Pt got upset started slamming her hand on the glass door. Security redirected her to stay in  her  room.

## 2020-10-19 NOTE — ED Notes (Signed)
Pt DC off unit to facility per provider. Pt alert and no s/s of distress.DC information and belongings given to sheriff for facility. Pt ambulatory off unit , escorted and transported by sheriff.

## 2020-10-19 NOTE — ED Notes (Signed)
Patient awakened requesting shower.  Staff advised patient she could shower later in the morning.  Patient became hostile and verbally aggressive toward staff.  Patient yelling and screaming in room.

## 2020-10-19 NOTE — ED Notes (Signed)
Writer took patient medication.  Patient initially refusing reporting that she does not take 750 mg of Depakote.  After lengthy conversation, patient agreed to take medication.

## 2020-10-19 NOTE — ED Provider Notes (Signed)
Emergency Medicine Observation Re-evaluation Note  [name] [name] is a 40 y.o. female, seen on rounds today.  Pt initially presented to the ED for complaints of Medical Clearance Currently, the patient is resting comfortably.  Physical Exam  BP 123/88 (BP Location: Right Arm)   Pulse (!) 109   Temp (!) 97.4 F (36.3 C) (Oral)   Resp 18   Ht 5\' 4"  (1.626 m)   Wt 77 kg   SpO2 100%   BMI 29.14 kg/m  Physical Exam General: No acute distress Cardiac: Regular rate Lungs: No respiratory distress Psych: Calm  ED Course / MDM  EKG:EKG Interpretation  Date/Time:  Friday October 12 2020 14:22:52 EDT Ventricular Rate:  101 PR Interval:  156 QRS Duration: 76 QT Interval:  336 QTC Calculation: 435 R Axis:   50 Text Interpretation: Sinus tachycardia Nonspecific T wave abnormality Abnormal ECG since last tracing no significant change Confirmed by Daleen Bo [phone]) on 10/12/2020 4:27:24 PM  I have reviewed the labs performed to date as well as medications administered while in observation.  Recent changes in the last 24 hours include : None.  Plan  Current plan is for patient to be admitted for psych optimization. [name] [name] is under involuntary commitment.      Varney Biles, MD 10/19/20 1313

## 2020-10-19 NOTE — ED Notes (Signed)
Patient currently calm and cooperative.

## 2020-10-19 NOTE — ED Notes (Signed)
Patient currently sleeping.  Writer unable to arouse patient fully to take medication.

## 2020-10-19 NOTE — ED Notes (Signed)
Patient alert. Medication compliant.  Irritable. Hostile with staff. Wants to go home.

## 2020-10-19 NOTE — BH Assessment (Signed)
[facility] Assessment Progress Note   Per Sheran Fava, NP, this pt continues to require psychiatric hospitalization at this time.  Pt is under IVC initiated by EDP Fredia Sorrow, MD.  At 09:23 this writer spoke to Marlow from [facility];she reports that pt has been accepted to their facility.  Hampton Abbot, MD concurs with this decision.  EDP Varney Biles, MD and pt's nurse, Eustaquio Maize, have been notified, and Eustaquio Maize agrees to call report to [phone].  Pt is to be transported via [facility]  [facility] Coordinator [phone]

## 2020-11-27 ENCOUNTER — Emergency Department (HOSPITAL_COMMUNITY)
Admission: EM | Admit: 2020-11-27 | Discharge: 2020-11-29 | Disposition: A | Discharge status: Other Acute Inpatient Hospital | Payer: Medicare Other | Attending: Emergency Medicine | Admitting: Emergency Medicine

## 2020-11-27 ENCOUNTER — Other Ambulatory Visit: Payer: Self-pay

## 2020-11-27 ENCOUNTER — Emergency Department (HOSPITAL_COMMUNITY): Admit: 2020-11-27 | Payer: Medicare Other | Source: Home / Self Care

## 2020-11-27 DIAGNOSIS — Z20822 Contact with and (suspected) exposure to covid-19: Secondary | ICD-10-CM | POA: Insufficient documentation

## 2020-11-27 DIAGNOSIS — F29 Unspecified psychosis not due to a substance or known physiological condition: Secondary | ICD-10-CM | POA: Diagnosis not present

## 2020-11-27 DIAGNOSIS — F209 Schizophrenia, unspecified: Secondary | ICD-10-CM | POA: Insufficient documentation

## 2020-11-27 DIAGNOSIS — F23 Brief psychotic disorder: Secondary | ICD-10-CM

## 2020-11-27 DIAGNOSIS — Z9114 Patient's other noncompliance with medication regimen: Secondary | ICD-10-CM | POA: Insufficient documentation

## 2020-11-27 DIAGNOSIS — F419 Anxiety disorder, unspecified: Secondary | ICD-10-CM | POA: Insufficient documentation

## 2020-11-27 DIAGNOSIS — Z79899 Other long term (current) drug therapy: Secondary | ICD-10-CM | POA: Insufficient documentation

## 2020-11-27 LAB — CBC
HCT: 36.3 % (ref 36.0–46.0)
Hemoglobin: 12.1 g/dL (ref 12.0–15.0)
MCH: 31.3 pg (ref 26.0–34.0)
MCHC: 33.3 g/dL (ref 30.0–36.0)
MCV: 94 fL (ref 80.0–100.0)
Platelets: 146 10*3/uL — ABNORMAL LOW (ref 150–400)
RBC: 3.86 MIL/uL — ABNORMAL LOW (ref 3.87–5.11)
RDW: 14.2 % (ref 11.5–15.5)
WBC: 5.9 10*3/uL (ref 4.0–10.5)
nRBC: 0 % (ref 0.0–0.2)

## 2020-11-27 LAB — COMPREHENSIVE METABOLIC PANEL
ALT: 22 U/L (ref 0–44)
AST: 30 U/L (ref 15–41)
Albumin: 4 g/dL (ref 3.5–5.0)
Alkaline Phosphatase: 48 U/L (ref 38–126)
Anion gap: 12 (ref 5–15)
BUN: 12 mg/dL (ref 6–20)
CO2: 25 mmol/L (ref 22–32)
Calcium: 9.2 mg/dL (ref 8.9–10.3)
Chloride: 103 mmol/L (ref 98–111)
Creatinine, Ser: 0.92 mg/dL (ref 0.44–1.00)
GFR, Estimated: >60 mL/min (ref >60)
Glucose, Bld: 99 mg/dL (ref 70–99)
Potassium: 3.2 mmol/L — ABNORMAL LOW (ref 3.5–5.1)
Sodium: 140 mmol/L (ref 135–145)
Total Bilirubin: 1 mg/dL (ref 0.3–1.2)
Total Protein: 8.1 g/dL (ref 6.5–8.1)

## 2020-11-27 LAB — ETHANOL: Alcohol, Ethyl (B): <10 mg/dL (ref <10)

## 2020-11-27 LAB — I-STAT BETA HCG BLOOD, ED (MC, WL, AP ONLY): I-stat hCG, quantitative: <5 m[IU]/mL (ref <5)

## 2020-11-27 LAB — VALPROIC ACID LEVEL: Valproic Acid Lvl: 12 ug/mL — ABNORMAL LOW (ref 50.0–100.0)

## 2020-11-27 MED ORDER — STERILE WATER FOR INJECTION IJ SOLN
INTRAMUSCULAR | Status: AC
  Filled 2020-11-27: qty 10

## 2020-11-27 MED ORDER — LORAZEPAM 2 MG/ML IJ SOLN: 1.0000 mg | Freq: Two times a day (BID) | INTRAMUSCULAR | Status: DC | PRN

## 2020-11-27 MED ORDER — POTASSIUM CHLORIDE CRYS ER 20 MEQ PO TBCR: 40.0000 meq | EXTENDED_RELEASE_TABLET | Freq: Once | ORAL | Status: DC

## 2020-11-27 MED ORDER — HALOPERIDOL LACTATE 5 MG/ML IJ SOLN: 10.0000 mg | Freq: Two times a day (BID) | INTRAMUSCULAR | Status: DC | PRN

## 2020-11-27 MED ORDER — DIPHENHYDRAMINE HCL 50 MG/ML IJ SOLN
50.0000 mg | Freq: Two times a day (BID) | INTRAMUSCULAR | Status: DC | PRN
  Administered 2020-11-27 – 2020-11-28 (×2): 50 mg via INTRAMUSCULAR
  Filled 2020-11-27 (×2): qty 1

## 2020-11-27 MED ORDER — DIPHENHYDRAMINE HCL 50 MG/ML IJ SOLN: 50.0000 mg | Freq: Two times a day (BID) | INTRAMUSCULAR | Status: DC | PRN

## 2020-11-27 MED ORDER — ZIPRASIDONE MESYLATE 20 MG IM SOLR
10.0000 mg | Freq: Once | INTRAMUSCULAR | Status: AC
  Administered 2020-11-27: 10 mg via INTRAMUSCULAR
  Filled 2020-11-27: qty 20

## 2020-11-27 MED ORDER — HALOPERIDOL LACTATE 5 MG/ML IJ SOLN
5.0000 mg | Freq: Two times a day (BID) | INTRAMUSCULAR | Status: DC | PRN
  Administered 2020-11-27 – 2020-11-28 (×2): 5 mg via INTRAMUSCULAR
  Filled 2020-11-27 (×2): qty 1

## 2020-11-27 MED ORDER — OLANZAPINE 5 MG PO TBDP
10.0000 mg | ORAL_TABLET | Freq: Two times a day (BID) | ORAL | Status: DC
  Administered 2020-11-27 – 2020-11-29 (×4): 10 mg via ORAL
  Filled 2020-11-27 (×4): qty 2

## 2020-11-27 MED ORDER — LORAZEPAM 2 MG/ML IJ SOLN
1.0000 mg | Freq: Two times a day (BID) | INTRAMUSCULAR | Status: DC | PRN
  Administered 2020-11-27 – 2020-11-28 (×2): 1 mg via INTRAMUSCULAR
  Filled 2020-11-27 (×2): qty 1

## 2020-11-27 MED ORDER — DIVALPROEX SODIUM 250 MG PO DR TAB
500.0000 mg | DELAYED_RELEASE_TABLET | Freq: Two times a day (BID) | ORAL | Status: DC
  Administered 2020-11-27 – 2020-11-29 (×4): 500 mg via ORAL
  Filled 2020-11-27 (×4): qty 2

## 2020-11-27 NOTE — ED Notes (Signed)
Pt sleeping. 

## 2020-11-27 NOTE — ED Provider Notes (Signed)
[facility] EMERGENCY DEPARTMENT Provider Note   CSN: [id number] Arrival date & time: 11/27/20  2355     History Chief Complaint  Patient presents with   Schizophrenia    [name] [name] is a 40 y.o. female.  Patient with hx schizophrenia, reportedly dropped off in ED for behavioral health evaluation (no IVC). Pt very limited historian - level 5 caveat/psychiatric illness. Pt pacing about room, appearing to talk about one unrelated topic/event then rapidly switching to another. Appears anxious. States she needs to 'pray for all of [place], devil is all about, animals are being abused'....  Patient initially registered under new/wrong name - for patient is previously registered as: [name] [name], [name] [name] - [phone]  The history is provided by the patient, the police and medical records. The history is limited by the condition of the patient.      No past medical history on file.  There are no problems to display for this patient.   PMH: schizophrenia   OB History   No obstetric history on file.     No family history on file.     Home Medications Prior to Admission medications   Not on File    Allergies    Patient has no allergy information on record.  Review of Systems   Review of Systems  Unable to perform ROS: Psychiatric disorder  Patient uncooperative w ros/psych illness   Physical Exam Updated Vital Signs BP 137/87   Pulse (!) 111   Temp 98.1 F (36.7 C) (Oral)   Resp 20   SpO2 100%   Physical Exam Vitals and nursing note reviewed.  Constitutional:      Appearance: Normal appearance. She is well-developed.  HENT:     Head: Atraumatic.     Nose: Nose normal.     Mouth/Throat:     Mouth: Mucous membranes are moist.  Eyes:     General: No scleral icterus.    Conjunctiva/sclera: Conjunctivae normal.     Pupils: Pupils are equal, round, and reactive to light.  Neck:     Vascular: No carotid bruit.     Trachea: No  tracheal deviation.     Comments: No stiffness or rigidity.  Cardiovascular:     Rate and Rhythm: Normal rate and regular rhythm.     Pulses: Normal pulses.     Heart sounds: Normal heart sounds. No murmur heard.   No friction rub. No gallop.  Pulmonary:     Effort: Pulmonary effort is normal. No respiratory distress.     Breath sounds: Normal breath sounds.  Abdominal:     General: Bowel sounds are normal. There is no distension.     Palpations: Abdomen is soft.     Tenderness: There is no abdominal tenderness.  Genitourinary:    Comments: No cva tenderness.  Musculoskeletal:        General: No swelling.     Cervical back: Normal range of motion and neck supple. No rigidity. No muscular tenderness.  Skin:    General: Skin is warm and dry.     Findings: No rash.  Neurological:     Mental Status: She is alert.     Comments: Alert, speech fluent, no gross dysarthria or aphasia noted. Pacing about room, moving bilateral extremities purposefully with good strength. No tremors. Steady gait.   Psychiatric:     Comments: Appears anxious. Pacing about. Rapidly switches from one unrelated topic to next. Appears to be responding to internal stimuli.  ED Results / Procedures / Treatments   Labs (all labs ordered are listed, but only abnormal results are displayed) Results for orders placed or performed during the hospital encounter of 11/27/20  CBC  Result Value Ref Range   WBC 5.9 4.0 - 10.5 K/uL   RBC 3.86 (L) 3.87 - 5.11 MIL/uL   Hemoglobin 12.1 12.0 - 15.0 g/dL   HCT 36.3 36.0 - 46.0 %   MCV 94.0 80.0 - 100.0 fL   MCH 31.3 26.0 - 34.0 pg   MCHC 33.3 30.0 - 36.0 g/dL   RDW 14.2 11.5 - 15.5 %   Platelets 146 (L) 150 - 400 K/uL   nRBC 0.0 0.0 - 0.2 %  Comprehensive metabolic panel  Result Value Ref Range   Sodium 140 135 - 145 mmol/L   Potassium 3.2 (L) 3.5 - 5.1 mmol/L   Chloride 103 98 - 111 mmol/L   CO2 25 22 - 32 mmol/L   Glucose, Bld 99 70 - 99 mg/dL   BUN 12 6 - 20  mg/dL   Creatinine, Ser 0.92 0.44 - 1.00 mg/dL   Calcium 9.2 8.9 - 10.3 mg/dL   Total Protein 8.1 6.5 - 8.1 g/dL   Albumin 4.0 3.5 - 5.0 g/dL   AST 30 15 - 41 U/L   ALT 22 0 - 44 U/L   Alkaline Phosphatase 48 38 - 126 U/L   Total Bilirubin 1.0 0.3 - 1.2 mg/dL   GFR, Estimated >60 >60 mL/min   Anion gap 12 5 - 15  Ethanol  Result Value Ref Range   Alcohol, Ethyl (B) <10 <10 mg/dL  Valproic acid level  Result Value Ref Range   Valproic Acid Lvl 12 (L) 50.0 - 100.0 ug/mL   No results found.  EKG None  Radiology No results found.  Procedures Procedures   Medications Ordered in ED Medications  potassium chloride SA (KLOR-CON) CR tablet 40 mEq (has no administration in time range)  ziprasidone (GEODON) injection 10 mg (10 mg Intramuscular Given 11/27/20 0839)  sterile water (preservative free) injection (  Given 11/27/20 0840)    ED Course  I have reviewed the triage vital signs and the nursing notes.  Pertinent labs & imaging results that were available during my care of the patient were reviewed by me and considered in my medical decision making (see chart for details).    MDM Rules/Calculators/A&P                          Registration alerted to register patient under correct name/MR #.   Reviewed nursing notes and prior charts for additional history. Pt appears to have had recent prior inpatient psych admission, and several prior presentations with similar symptoms noted.   Pharmacy to do med rec - will restart home meds as soon as confirmed.   Geodon 10 mg im.  Labs reviewed/interpreted by me - wbc and hgb normal. K sl low. Kcl po.   [place] team consulted.   The patient has been placed in psychiatric observation due to the need to provide a safe environment for the patient while obtaining psychiatric consultation and evaluation, as well as ongoing medical and medication management to treat the patient's condition.    BH eval pending - disposition and [place] med  management per [facility] team.      Final Clinical Impression(s) / ED Diagnoses Final diagnoses:  None    Rx / DC Orders ED Discharge Orders  None        Lajean Saver, MD 11/27/20 [phone]

## 2020-11-27 NOTE — ED Notes (Addendum)
ED Provider at bedside. Patient remains with eyes closed, respirations even and unlabored.

## 2020-11-27 NOTE — ED Notes (Signed)
Pt woke up and began wandering in hallway, extremely delusional, speaking repeatedly "jesus christ, jesus christ, I want to walk with jesus christ, I need to be with jesus." Pt not able to be redirected. TTS sent secure chat asking to see pt but may not be feasible at this time.

## 2020-11-27 NOTE — ED Notes (Signed)
Pt displaying extreme psychosis, hyperverbal and religiosity with flight of ideas and rapid pressured speech. Pt climbed up on bed and was standing on bed posing a significant safety risk. Security called to bedside and IM injections given. Sitter was pulled from less acute pt and redeployed to sit with this pt. Pt received IM injections without incident. Pt sitting up in stretcher, remains hyperverbal with flight of ideas, frequently referencing the "blood of god" and praying. Monitoring closely.

## 2020-11-27 NOTE — BH Assessment (Signed)
Clinician messaged [place] A. Moore[place], RN: "Hey. It's Trey with TTS. I seen the pt was sedated earlier. I just wanted to check in to see if the pt was able to be assessed at this time. Is so, is she under IVC?"  Clinician awaiting response.     Vertell Novak, [place], [facility], [facility] Triage Specialist [phone]

## 2020-11-27 NOTE — ED Triage Notes (Signed)
Pt. Stated, Im here cause someone is abusing the dog. Pick up by the police.

## 2020-11-27 NOTE — ED Notes (Signed)
RN obtained order for sitter due to patient condition, called staffing office but no sitters currently available.

## 2020-11-27 NOTE — ED Triage Notes (Signed)
Pt. Is talking randomly about everything.Does not answer or speak with any meaning.

## 2020-11-27 NOTE — BH Assessment (Signed)
TTS attempted to see patient, but she is acutely psychotic and is requiring sedation.

## 2020-11-27 NOTE — BH Assessment (Signed)
Per [place] A. Moore[place], RN the pt is currently sleeping; unable to engage in assessment.   Clinician asked RN if she can let her know when the pt is able to engage. Also to fax the pt's IVC paperwork.    Vertell Novak, [place], [facility], [facility] Triage Specialist [phone]

## 2020-11-28 DIAGNOSIS — F209 Schizophrenia, unspecified: Secondary | ICD-10-CM | POA: Diagnosis not present

## 2020-11-28 LAB — RESP PANEL BY RT-PCR (FLU A&B, COVID) ARPGX2
Influenza A by PCR: NEGATIVE
Influenza B by PCR: NEGATIVE
SARS Coronavirus 2 by RT PCR: NEGATIVE

## 2020-11-28 NOTE — ED Notes (Signed)
Pt becoming more agitated. Pt pulling code button and has been asked multiple times to stay in room. Pt not listening to nursing staff and continuing to walk into nursing staff person space after being told not to. Pt ambulating to door, after being told she needs to stay in the room

## 2020-11-28 NOTE — ED Notes (Signed)
Pt awake for brief period of time. Speaking very quietly in single word answers, moving extremely slowly. Pt ate part of dinner tray, took night time medication without incident, and went back to sleep.

## 2020-11-28 NOTE — ED Notes (Signed)
Pt asking this RN to look up her address - pt address in the chart is not the address the pt stated - pt would like [place] to be listed
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
## 2020-11-28 NOTE — ED Notes (Deleted)
In Triage

## 2020-11-28 NOTE — ED Notes (Signed)
Lunch has been delivered

## 2020-11-28 NOTE — ED Notes (Signed)
Pt TTS completed. Pt calm at this time and agreeable to COVID swab at this time and requested to take a shower. Pt showering at this time.

## 2020-11-28 NOTE — ED Notes (Signed)
Break order placed

## 2020-11-28 NOTE — ED Notes (Signed)
Pt at nurses station to make phone call, pt reminded that this is her second call of the day

## 2020-11-28 NOTE — BH Assessment (Addendum)
Per Quintella Reichert Conley Canal, RN pt is awake and able to engage. Clinician expressed she will engage pt in assessment once she received the pt's IVC paperwork. Clinician provided fax number [phone]).    Vertell Novak, [place], [facility], [facility] Triage Specialist [phone]

## 2020-11-28 NOTE — ED Provider Notes (Signed)
Emergency Medicine Observation Re-evaluation Note  [name] [name] is a 40 y.o. female, seen on rounds today.  Pt initially presented to the ED for complaints of non compliance w meds and acute psychosis. Pt currently calm, alert, interacting w staff.   Physical Exam  BP 117/78   Pulse (!) 106   Temp 98.2 F (36.8 C) (Oral)   Resp 16   SpO2 97%  Physical Exam General: calm, nad.  Cardiac: regular rate Lungs: breathing comfortably Psych: calm, alert, nad.   ED Course / MDM   I have reviewed the labs performed to date as well as medications administered while in observation.  Recent changes in the last 24 hours include ED obs, medication management, [place] consultation and reassessment.   Plan  Current plan is for inpatient BH treatment [facility] team is currently pursuing placement.         Lajean Saver, MD 11/28/20 [phone]

## 2020-11-28 NOTE — BH Assessment (Signed)
Per Quintella Reichert Conley Canal, RN "Hi there! I am taking over for [place]. This patient is still sleeping - thanks" Clinician replied, Thanks so much. Just let us know when she's awake and we'll see her.     Vertell Novak, [place], [facility], [facility] Triage Specialist [phone]

## 2020-11-28 NOTE — ED Notes (Signed)
When giving patient medication, noticed she had a necklace on with red beads. Attempting to get patient to give up necklace so it can be locked up with other belongings.

## 2020-11-28 NOTE — ED Notes (Signed)
Patient used a phone to make a call.

## 2020-11-28 NOTE — ED Notes (Signed)
Pt making phone call at this time - pt reminded that she gets two phone calls a day

## 2020-11-28 NOTE — ED Notes (Signed)
TTS on going at this time

## 2020-11-28 NOTE — ED Provider Notes (Signed)
Emergency Medicine Observation Re-evaluation Note  [name] [name] is a 40 y.o. female, seen on rounds today.  Pt initially presented to the ED for complaints of Schizophrenia Currently, the patient is resting.  Physical Exam  BP 117/78   Pulse (!) 106   Temp 98.2 F (36.8 C) (Oral)   Resp 16   SpO2 97%  Physical Exam General: resting comfortably, NAD Lungs: normal WOB Psych: currently calm and resting  ED Course / MDM  EKG:   I have reviewed the labs performed to date as well as medications administered while in observation.  Recent changes in the last 24 hours include none.  Plan  Current plan is for inpatient behavioral health treatment, currently pending placement. Niomie Englert is under involuntary commitment.      Lorelle Gibbs, DO 11/28/20 9290

## 2020-11-28 NOTE — BH Assessment (Signed)
Clinician messaged [place] A. [place], RN: "Hey [place] just checking in; is the pt still sleeping?"  Clinician awaiting response.    Vertell Novak, MS, [facility], [facility] Triage Specialist [phone]'

## 2020-11-28 NOTE — ED Notes (Addendum)
Pt is refusing COVID swab at this time stating that she has already been vaccinated for such - this RN tried to explain to pt that it is a test and not a vaccination - pt continues to refuse at this time stating she needs to go home - TTS counselor messaged to see if they are ready to assess patient

## 2020-11-28 NOTE — Progress Notes (Signed)
Inpatient Behavioral Health Placement  Pt meets inpatient criteria per Margorie John, PA-C. There are no appropriate beds per [facility] Lynnda Shields, RN. Referral was sent to the following facilities:   Destination Service Provider Address Phone Fax  [facility] [place] [address] [phone] [facility]  [address] [place] [address] [place], [place] [address] [facility]  [place], [place] [address] [phone] [place], [place] [address] [phone]  [place] [place] [address] [phone] [facility]  [address] [place] [address] [phone] [place]  [address], [place] [address] [phone]  [place]  [address] [place] [address] [phone] [facility]  [address] [place] [address] [phone]  [facility]  [address], [place] [address] [phone] [facility]  [address], [place] [address] [phone]  [facility]  [place], [place] [address] [place]  [facility]  [address], [place] [address] [phone] [facility]  [address], [place] [address] [phone] [facility]  [id number] [place] [address] [phone]    Situation ongoing,  CSW will follow up.   Benjaman Kindler, MSW,  [facility] 11/28/2020  @ 7:06 PM

## 2020-11-28 NOTE — ED Notes (Signed)
Pt telling this RN that she needs to be at home because her husband needs her. Pt educated that we are waiting on her disposition. Pt agreeable and returned back to room.

## 2020-11-29 DIAGNOSIS — F209 Schizophrenia, unspecified: Secondary | ICD-10-CM | POA: Diagnosis not present

## 2020-11-29 LAB — RAPID URINE DRUG SCREEN, HOSP PERFORMED
Amphetamines: NOT DETECTED
Barbiturates: NOT DETECTED
Benzodiazepines: POSITIVE — AB
Cocaine: NOT DETECTED
Opiates: NOT DETECTED
Tetrahydrocannabinol: NOT DETECTED

## 2020-11-29 MED ORDER — LORAZEPAM 2 MG/ML IJ SOLN: 1.0000 mg | Freq: Three times a day (TID) | INTRAMUSCULAR | Status: DC | PRN

## 2020-11-29 MED ORDER — LORAZEPAM 1 MG PO TABS
1.0000 mg | ORAL_TABLET | Freq: Three times a day (TID) | ORAL | Status: DC | PRN
  Administered 2020-11-29: 1 mg via ORAL
  Filled 2020-11-29: qty 1

## 2020-11-29 NOTE — ED Notes (Signed)
Pt came to nurses station requesting her minister be called because he has her prescriptions. Pt reports her glasses are on her way to her home and that she can see people clearly, but can't see outside and that she needs her cane that she requested. Pt is ambulatory w/ steady gait.
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
## 2020-11-29 NOTE — ED Notes (Signed)
Pt resting at this time - will obtain vitals when pt is awake

## 2020-11-29 NOTE — ED Notes (Signed)
Pt provided urine specimen.

## 2020-11-29 NOTE — ED Notes (Signed)
Pt came to nurses station requesting birth control. Pt says she has a fiance at home and she has been really working on it. Pt reporting going to different [employer] and trying different food. Pt reports she is here because she needs birth control because of doing activities she doesn't normally do. Pt then ambulated back to room.

## 2020-11-29 NOTE — ED Notes (Signed)
Pt having 1st phone call for the day, states she is calling her lawyer to leave a message
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
## 2020-11-29 NOTE — ED Notes (Addendum)
Pt becoming increasingly agitated and more verbally aggressive towards w/ pressured speech and flight of ideas. Notified White PA

## 2020-11-29 NOTE — ED Notes (Signed)
Patient keep coming out wanting nurse and tech to call someone about her keys and car.x5.

## 2020-11-29 NOTE — ED Notes (Signed)
Sheriff's office here to transport pt

## 2020-11-29 NOTE — Progress Notes (Addendum)
Pt is ACCEPTED to [facility] for 11/29/20 after 8am pending UDS and IVC paperwork faxed to [phone]. CSW contacted nursing; Manson Allan, RN to informed and request that the following information be faxed. Sherryl Barters, RN confirmed that she received the message.  1st shift covering CSW to follow-up.   Benjaman Kindler, MSW, LCSWA 11/29/2020 12:16 AM

## 2020-11-29 NOTE — ED Notes (Signed)
Pt came to nurses station w/ soft spoken speech asking if her friend Marcelino Scot could be called in regards to she says her glasses and ID are supposed to be delivered to his house and that he has her prescriptions in her car. Pt reports she cannot talk without her glasses and she apologizes for the stuttering. Pt says she doesn't have to stay here since all she needs to do is get her prescriptions filled. Pt then ambulated back to her room.

## 2020-11-29 NOTE — ED Notes (Addendum)
Per Shelton Silvas, LCSW pt has been accepted to [facility] after UDS has been collected and resulted and IVC paperwork faxed to [phone].

## 2020-11-29 NOTE — Progress Notes (Signed)
CSW has reached out to nurse, Sammuel Bailiff, RN to confirm that UA and IVC paperwork has been sent.  CSW offered any assistance.  CSW to follow up.  Assunta Curtis, MSW, LCSW 11/29/2020 10:14 AM

## 2020-11-29 NOTE — ED Notes (Addendum)
Sheriff's office called back and says they are en route and will now be able to transfer pt to [facility] ETA 30 min. Notified EDP, Agricultural consultant, [place] social worker, White NP.

## 2020-11-29 NOTE — ED Provider Notes (Addendum)
  Physical Exam  BP 113/85 (BP Location: Right Arm)   Pulse (!) 120   Temp 97.9 F (36.6 C) (Oral)   Resp 20   SpO2 99%   Physical Exam  ED Course/Procedures     Procedures  MDM  Accepted at [facility].  Dr. Franchot Mimes.  Reevaluated.  Sitting comfortably outside her room.       Davonna Belling, MD 11/29/20 1500  Called Sheriff's office reportedly cannot transport patient till tomorrow.  Will remain in ER   Davonna Belling, MD 11/29/20 1511  Sheriff's office reportedly called back and said they can now transport    Davonna Belling, MD 11/29/20 [phone]

## 2020-11-29 NOTE — ED Notes (Addendum)
Attempted to call report to [facility]

## 2020-11-29 NOTE — ED Notes (Signed)
Belongings removed from locker 4 and ready for transport

## 2020-11-29 NOTE — ED Notes (Signed)
Pt sitting in chair outside of room. Pt says she really wants to go home.

## 2020-11-29 NOTE — ED Notes (Signed)
Pt belongings returned to locker #4

## 2020-11-29 NOTE — ED Notes (Signed)
Urine specimen sent to lab

## 2020-11-29 NOTE — Progress Notes (Signed)
CSW has sent UDS results to [facility] at this time.   CSW notes that UDS results are dated for 11/27/2020.  Per Sammuel Bailiff, RN this is due to a possible bale error.  CSW pointed out that this may be a hindrance to admission as this information was a part of the paperwork sent my disposition CSW that initiated the patient's pending admission to [place].  CSW has requested that lab correct the date if possible.   CSW will follow up with Rosana Hoes to verify.  Disposition ongoing.  Assunta Curtis, MSW, LCSW 11/29/2020 10:34 AM
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
## 2020-11-29 NOTE — ED Notes (Signed)
GAVE PATIENT A CUP FOR  URINE SAMPLE

## 2020-11-29 NOTE — Progress Notes (Addendum)
IVC paperwork and UDS results MUST accompany patient to admission.  [facility] requests that the sheriff be given the info to give to them.  Patient was accepted to [facility].  Meets inpatient criteria per Margorie John, PA-C.  Attending physician is Dr. Franchot Mimes.  Notified Sammuel Bailiff, RN of acceptance.  Nurses call report to [phone] ext [phone].   Nurses should be able to call [phone] directly but I have had difficulty and have had to go through the switchboard and ask for the extension so I provided both.    Patient can arrive anytime on 11/29/2020.   Assunta Curtis, MSW, LCSW 11/29/2020 1:32 PM

## 2020-11-29 NOTE — ED Notes (Signed)
Pt came to nurses station and interrupted staff after being asked to wait a minute d/t getting report on another pt. Pt continued to talk over staff trying to give report and became agitated saying please don't abuse my patience. Pt ambulated to bathroom.

## 2020-11-29 NOTE — ED Notes (Signed)
PATIENT ATE 100% DRANK 480 FOR LUNCH

## 2020-11-29 NOTE — ED Notes (Signed)
Patient came to nurses station asking if we have pads or tampons in case she starts her period, but she isn't supposed to because she is supposed to be getting birth control. Pt reports that the doctor told her to take the shot in the arm. Pt them ambulated back to room. Pt then came to nurses station again asking for overactive bladder medication. Pt then ambulated to bathroom and came back to nurses station with urine specimen. Pt was told that we didn't need another sample, d/t we had already sent her first sample to the lab. Pt then instructed that she could throw sample away and she stated, that she was going to keep it. Pt walked back into her room and shut door.

## 2020-11-29 NOTE — Progress Notes (Addendum)
ADDENDUM Additional contact information provided.  Operator [phone] ask for [employer] or extension [phone].  At this time no fax received on their end.  Assunta Curtis, MSW, LCSW 11/29/2020 11:21 AM    CSW spoke with Clarene Critchley at [facility], [phone]. She reports that NO documentation has been received on patient.  CSW obtained information on what was needed: UDS, Covid screening, labs.  Clarene Critchley requested that information be sent to [phone] Attention [place].    CSW has sent the requested documents and received confirmation fax was successful.  Clarene Critchley reports that she will follow up with CSW once fax is received.  CSW provided contact information for Clarene Critchley.    She reports that once received and reviewed, report and bed information will be provided.   Assunta Curtis, MSW, LCSW 11/29/2020 11:08 AM

## 2020-11-29 NOTE — Progress Notes (Signed)
CSW called to follow up on pending admission.    Despite confirmation on CSW end and verbal confirmation that fax with the requested information was successful, Clarene Critchley at [facility] denies receiving it.  She reports that she will check the machines and follow up.   Email option not available.   Clarene Critchley reports that patient may be sent with paperwork via sheriff, however, she will call back with confirmation for this plan.  CSW awaiting return call.  Assunta Curtis, MSW, LCSW 11/29/2020 12:56 PM

## 2020-11-29 NOTE — ED Notes (Signed)
Sheriff's office returned phone call regarding transport and said that they can't transport pt until tomorrow due to the total time of transport will put the officer working past 1700. Notified charge RN, EDP, [place] social worker and [place] PA to notify if events and delay in pt transfer.

## 2020-11-29 NOTE — ED Notes (Signed)
Pt having 2nd phone call for the day

## 2020-11-29 NOTE — ED Notes (Signed)
Pt came to nurses station reporting she has history of blindness and has her glasses on the way to her house and she can't drive until she has them.

## 2020-11-29 NOTE — ED Notes (Signed)
Per report from previous shift, pt has been accepted to [facility] after 0800 pending UDS. Per report IVC paperwork was faxed on previous shift to [facility] to find out room number and phone number for [facility] to give report so pt can be transported to inpatient bed.

## 2020-11-29 NOTE — Progress Notes (Signed)
CSW again followed up with Clarene Critchley at [facility].  She reports that she still has not received the requested documents nor had the opportunity to look for them.  She began making plans with CSW for patient to arrive via sheriff with the paperwork.    CSW began to obtain admission information to provide to nursing staff at [facility].  As CSW was asking  for attending physician and where nurses should call report to Clarene Critchley stopped the conversation stating she needed to follow up with the doctor due to not being confident that patient was accepted, because CSW did not have that information.   CSW explained, again, for Clarene Critchley the situation regarding patient being admitted pending the receipt of UDS and IVC paperwork, CSW has sent several times but Rosana Hoes has not received or had the time to look for it.  Clarene Critchley continued to deny admission at this time.   Disposition pending.   Assunta Curtis, MSW, LCSW 11/29/2020 2:12 PM

## 2020-11-29 NOTE — ED Notes (Signed)
All paperwork and belongings given to [facility] sheriff's department officers.

## 2020-11-29 NOTE — ED Notes (Signed)
PATIENT HAS A SITTER

## 2020-12-03 ENCOUNTER — Encounter (HOSPITAL_COMMUNITY): Payer: Self-pay | Admitting: Registered Nurse

## 2020-12-06 ENCOUNTER — Ambulatory Visit (HOSPITAL_COMMUNITY): Payer: Medicare Other | Admitting: Student in an Organized Health Care Education/Training Program

## 2021-02-17 ENCOUNTER — Emergency Department (HOSPITAL_COMMUNITY)
Admission: EM | Admit: 2021-02-17 | Discharge: 2021-02-20 | Disposition: A | Discharge status: Home / Self Care | Payer: Medicare Other | Attending: Emergency Medicine | Admitting: Emergency Medicine

## 2021-02-17 DIAGNOSIS — F250 Schizoaffective disorder, bipolar type: Secondary | ICD-10-CM | POA: Diagnosis present

## 2021-02-17 DIAGNOSIS — F29 Unspecified psychosis not due to a substance or known physiological condition: Secondary | ICD-10-CM | POA: Diagnosis not present

## 2021-02-17 DIAGNOSIS — Z79899 Other long term (current) drug therapy: Secondary | ICD-10-CM | POA: Insufficient documentation

## 2021-02-17 DIAGNOSIS — N9489 Other specified conditions associated with female genital organs and menstrual cycle: Secondary | ICD-10-CM | POA: Insufficient documentation

## 2021-02-17 DIAGNOSIS — E119 Type 2 diabetes mellitus without complications: Secondary | ICD-10-CM | POA: Insufficient documentation

## 2021-02-17 DIAGNOSIS — F23 Brief psychotic disorder: Secondary | ICD-10-CM | POA: Diagnosis present

## 2021-02-17 DIAGNOSIS — Z20822 Contact with and (suspected) exposure to covid-19: Secondary | ICD-10-CM | POA: Diagnosis not present

## 2021-02-17 DIAGNOSIS — Z046 Encounter for general psychiatric examination, requested by authority: Secondary | ICD-10-CM | POA: Diagnosis present

## 2021-02-17 LAB — COMPREHENSIVE METABOLIC PANEL
ALT: 22 U/L (ref 0–44)
AST: 33 U/L (ref 15–41)
Albumin: 4.5 g/dL (ref 3.5–5.0)
Alkaline Phosphatase: 44 U/L (ref 38–126)
Anion gap: 15 (ref 5–15)
BUN: 19 mg/dL (ref 6–20)
CO2: 18 mmol/L — ABNORMAL LOW (ref 22–32)
Calcium: 9.4 mg/dL (ref 8.9–10.3)
Chloride: 106 mmol/L (ref 98–111)
Creatinine, Ser: 1.06 mg/dL — ABNORMAL HIGH (ref 0.44–1.00)
GFR, Estimated: >60 mL/min (ref >60)
Glucose, Bld: 133 mg/dL — ABNORMAL HIGH (ref 70–99)
Potassium: 3.4 mmol/L — ABNORMAL LOW (ref 3.5–5.1)
Sodium: 139 mmol/L (ref 135–145)
Total Bilirubin: 1 mg/dL (ref 0.3–1.2)
Total Protein: 8.4 g/dL — ABNORMAL HIGH (ref 6.5–8.1)

## 2021-02-17 LAB — CBC WITH DIFFERENTIAL/PLATELET
Abs Immature Granulocytes: 0.02 10*3/uL (ref 0.00–0.07)
Basophils Absolute: 0 10*3/uL (ref 0.0–0.1)
Basophils Relative: 0 %
Eosinophils Absolute: 0 10*3/uL (ref 0.0–0.5)
Eosinophils Relative: 0 %
HCT: 37 % (ref 36.0–46.0)
Hemoglobin: 12.7 g/dL (ref 12.0–15.0)
Immature Granulocytes: 0 %
Lymphocytes Relative: 22 %
Lymphs Abs: 1.6 10*3/uL (ref 0.7–4.0)
MCH: 32.2 pg (ref 26.0–34.0)
MCHC: 34.3 g/dL (ref 30.0–36.0)
MCV: 93.7 fL (ref 80.0–100.0)
Monocytes Absolute: 0.6 10*3/uL (ref 0.1–1.0)
Monocytes Relative: 8 %
Neutro Abs: 5.1 10*3/uL (ref 1.7–7.7)
Neutrophils Relative %: 70 %
Platelets: 227 10*3/uL (ref 150–400)
RBC: 3.95 MIL/uL (ref 3.87–5.11)
RDW: 13.2 % (ref 11.5–15.5)
WBC: 7.3 10*3/uL (ref 4.0–10.5)
nRBC: 0 % (ref 0.0–0.2)

## 2021-02-17 LAB — RESP PANEL BY RT-PCR (FLU A&B, COVID) ARPGX2
Influenza A by PCR: NEGATIVE
Influenza B by PCR: NEGATIVE
SARS Coronavirus 2 by RT PCR: NEGATIVE

## 2021-02-17 LAB — I-STAT BETA HCG BLOOD, ED (MC, WL, AP ONLY): I-stat hCG, quantitative: <5 m[IU]/mL (ref <5)

## 2021-02-17 MED ORDER — ZIPRASIDONE MESYLATE 20 MG IM SOLR
20.0000 mg | Freq: Once | INTRAMUSCULAR | Status: AC
Start: 2021-02-17 — End: 2021-02-17
  Administered 2021-02-17: 20 mg via INTRAMUSCULAR
  Filled 2021-02-17: qty 20

## 2021-02-17 MED ORDER — LORAZEPAM 2 MG/ML IJ SOLN
1.0000 mg | Freq: Once | INTRAMUSCULAR | Status: AC
  Administered 2021-02-17: 1 mg via INTRAMUSCULAR
  Filled 2021-02-17: qty 1

## 2021-02-17 MED ORDER — OLANZAPINE 5 MG PO TBDP
5.0000 mg | ORAL_TABLET | Freq: Three times a day (TID) | ORAL | Status: DC | PRN
  Administered 2021-02-18 – 2021-02-19 (×3): 5 mg via ORAL
  Filled 2021-02-17 (×4): qty 1

## 2021-02-17 MED ORDER — LORAZEPAM 1 MG PO TABS
1.0000 mg | ORAL_TABLET | ORAL | Status: AC | PRN
  Administered 2021-02-18: 1 mg via ORAL
  Filled 2021-02-17: qty 1

## 2021-02-17 MED ORDER — STERILE WATER FOR INJECTION IJ SOLN
INTRAMUSCULAR | Status: AC
  Filled 2021-02-17: qty 10

## 2021-02-17 MED ORDER — ZIPRASIDONE MESYLATE 20 MG IM SOLR
20.0000 mg | INTRAMUSCULAR | Status: AC | PRN
  Administered 2021-02-18: 20 mg via INTRAMUSCULAR
  Filled 2021-02-17: qty 20

## 2021-02-17 MED ORDER — ZIPRASIDONE MESYLATE 20 MG IM SOLR
20.0000 mg | Freq: Once | INTRAMUSCULAR | Status: AC
  Administered 2021-02-17: 20 mg via INTRAMUSCULAR
  Filled 2021-02-17: qty 20

## 2021-02-17 NOTE — ED Notes (Signed)
IM Geodon and ativan given with no issue with [employer] present at bedside.

## 2021-02-17 NOTE — ED Notes (Signed)
I gave the pt a warm blanket  she appears to be cold..   she briefly opened her eyes and wrapped the blanket  around herself closed her eyes again

## 2021-02-17 NOTE — ED Notes (Signed)
This RN reached out to staffing office r/t sitter per nursing order. Informed no one to send at this time. [employer] currently remains with pt. Will continue to monitor.

## 2021-02-17 NOTE — ED Notes (Signed)
Pt talking loud and  very aggressive.  Not drowsy

## 2021-02-17 NOTE — ED Notes (Signed)
Rapid loud speech very  agitated charge nurse has been notified of the pts behavior   I have called security

## 2021-02-17 NOTE — ED Notes (Signed)
IVC papers: 1 copy to medical records Original in red folder  3 copies given to Pricilla Loveless, RN Copy faxed to [facility] and [facility]

## 2021-02-17 NOTE — BH Assessment (Signed)
RN Altha Harm Christo replied to SecureChat stating that pt has been given Geodon and is not ready for TTS assessment at this time.

## 2021-02-17 NOTE — ED Provider Notes (Addendum)
[facility] EMERGENCY DEPARTMENT Provider Note   CSN: [id number] Arrival date & time: 02/17/21  1214     History  Chief Complaint  Patient presents with   IVC    [name] [name] is a 41 y.o. female.  HPI  Patient has a history of bipolar disorder, schizophrenia and diabetes according to the medical records.  Patient was brought into the ED for acute agitation and abnormal behavior.  Patient was found outside mopping the street in the rain.  She was undressing herself and taking off her close in the middle of the street.  [employer] placed the patient under IVC.  Patient's speech was very tangential and she was anxious and hard to understand.  In the ED it is hard to get the patient to tell me exactly what brought her here.  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  She asked to speak to a grieving service.  She would like to leave.  Home Medications Prior to Admission medications   Medication Sig Start Date End Date Taking? Authorizing Provider  divalproex (DEPAKOTE ER) 250 MG 24 hr tablet Take 1 tablet (250 mg total) by mouth every morning. 07/20/19   Connye Burkitt, NP  divalproex (DEPAKOTE ER) 500 MG 24 hr tablet Take 1 tablet (500 mg total) by mouth at bedtime. 07/19/19   Connye Burkitt, NP  divalproex (DEPAKOTE) 500 MG DR tablet Take 1,000 mg by mouth at bedtime.    [provider]  docusate sodium (COLACE) 100 MG capsule Take 100 mg by mouth daily.    [provider]  hydrocerin (EUCERIN) CREA Apply 1 application topically 2 (two) times daily. Patient not taking: No sig reported 07/19/19   Connye Burkitt, NP  metoprolol succinate (TOPROL-XL) 25 MG 24 hr tablet Take 1 tablet (25 mg total) by mouth daily. Patient not taking: Reported on 10/09/2020 07/20/19   Connye Burkitt, NP  metoprolol tartrate (LOPRESSOR) 25 MG tablet Take 12.5 mg by mouth daily.    [provider]  OLANZapine (ZYPREXA) 15 MG tablet Take 1 tablet (15 mg total) by mouth  daily. 09/19/20   Derrill Center, NP  OLANZapine (ZYPREXA) 15 MG tablet Take 15 mg by mouth daily. 09/19/20   [provider]  OLANZapine zydis (ZYPREXA) 15 MG disintegrating tablet Take 1 tablet (15 mg total) by mouth once for 1 dose. 09/19/20 10/09/21  Derrill Center, NP  Prenatal Vit-Fe Fumarate-FA (PRENATAL MULTIVITAMIN) TABS tablet Take 1 tablet by mouth daily at 12 noon. Patient not taking: No sig reported 07/06/19   Clapacs, Madie Reno, MD  spironolactone (ALDACTONE) 25 MG tablet Take 0.5 tablets (12.5 mg total) by mouth daily. Patient not taking: Reported on 10/09/2020 07/20/19   Connye Burkitt, NP      Allergies    Quetiapine, Gabapentin, Lorazepam, Risperidone, Trazodone and nefazodone, Valproic acid, Aripiprazole, Fluphenazine, Haloperidol, Trazodone, and Ziprasidone hcl    Review of Systems   Review of Systems  Constitutional:  Negative for fever.   Physical Exam Updated Vital Signs BP (!) 155/105 (BP Location: Right Arm)    Pulse (!) 128    Temp 98.3 F (36.8 C) (Oral)    Resp (!) 22    SpO2 99%  Physical Exam Vitals and nursing note reviewed.  Constitutional:      General: She is not in acute distress.    Appearance: She is well-developed.  HENT:     Head: Normocephalic and atraumatic.  Right Ear: External ear normal.     Left Ear: External ear normal.  Eyes:     General: No scleral icterus.       Right eye: No discharge.        Left eye: No discharge.     Conjunctiva/sclera: Conjunctivae normal.  Neck:     Trachea: No tracheal deviation.  Cardiovascular:     Rate and Rhythm: Normal rate.  Pulmonary:     Effort: Pulmonary effort is normal. No respiratory distress.     Breath sounds: No stridor.  Abdominal:     General: There is no distension.  Musculoskeletal:        General: No swelling or deformity.     Cervical back: Neck supple.  Skin:    General: Skin is warm and dry.     Findings: No rash.  Neurological:     Mental Status: She is alert.      Cranial Nerves: Cranial nerve deficit: no gross deficits.  Psychiatric:        Mood and Affect: Mood is anxious. Affect is labile.        Speech: Speech is rapid and pressured and tangential.        Behavior: Behavior is agitated and hyperactive.        Judgment: Judgment is impulsive and inappropriate.    ED Results / Procedures / Treatments   Labs (all labs ordered are listed, but only abnormal results are displayed) Labs Reviewed  COMPREHENSIVE METABOLIC PANEL - Abnormal; Notable for the following components:      Result Value   Potassium 3.4 (*)    CO2 18 (*)    Glucose, Bld 133 (*)    Creatinine, Ser 1.06 (*)    Total Protein 8.4 (*)    All other components within normal limits  CBC WITH DIFFERENTIAL/PLATELET  RAPID URINE DRUG SCREEN, HOSP PERFORMED  ETHANOL  I-STAT BETA HCG BLOOD, ED ([facility], [facility], [facility] ONLY)    EKG EKG Interpretation  Date/Time:  Sunday February 17 2021 13:00:45 EST Ventricular Rate:  128 PR Interval:  143 QRS Duration: 86 QT Interval:  297 QTC Calculation: 434 R Axis:   62 Text Interpretation: Sinus tachycardia Probable left atrial enlargement Borderline T abnormalities, anterior leads Artifact in lead(s) I II III aVR aVL aVF V3 V4 Since last tracing rate faster Confirmed by Dorie Rank [phone]) on 02/17/2021 1:06:48 PM  Radiology No results found.  Procedures Procedures    Medications Ordered in ED Medications  LORazepam (ATIVAN) injection 1 mg (1 mg Intramuscular Given 02/17/21 1246)  ziprasidone (GEODON) injection 20 mg (20 mg Intramuscular Given 02/17/21 1245)  sterile water (preservative free) injection (  Given 02/17/21 1246)    ED Course/ Medical Decision Making/ A&P Clinical Course as of 02/17/21 1433  Sun Feb 17, 2021  1244 IVC paperwork signed [PX]  1062 Patient calmer and more sedated after Geodon and Ativan [JK]  1414 CBC with Diff CBC normal [JK]  1414 Comprehensive metabolic panel(!) Bicarb slightly decreased.  Potassium decreased  to 3.4, doubt clinically significant [JK]  1415 Pregnancy test and drug screen are pending.  Otherwise medically cleared [JK]  1416 Caregiver was contacted by pharmacy to try and obtain her home medications.  Apparently patient is currently not on any medications.  She was given an injection when she left the psychiatric hospital but did not follow-up for subsequent injections which sounds like were supposed to occur monthly [JK]    Clinical Course User Index [JK]  Dorie Rank, MD                           Medical Decision Making Amount and/or Complexity of Data Reviewed Labs: ordered. Decision-making details documented in ED Course.  Risk Prescription drug management.  Patient presents acutely psychotic.  She has rapid tangential speech.  Patient is very agitated and her behavior is labile.  I have ordered Geodon Ativan IM.  We will continue to monitor closely The patient has been placed in psychiatric observation due to the need to provide a safe environment for the patient while obtaining psychiatric consultation and evaluation, as well as ongoing medical and medication management to treat the patient's condition.  The patient has been placed under full IVC at this time.         Final Clinical Impression(s) / ED Diagnoses Final diagnoses:  Acute psychosis ([place])     Dorie Rank, MD 02/17/21 1415    Dorie Rank, MD 02/17/21 1433

## 2021-02-17 NOTE — BH Assessment (Addendum)
Contacted pt's RN (nurse who has been charting on this pt today) Festus Aloe to ask if pt could participate in tele=assessment now. No response after 15 minutes. Will try back later.  I added another RN who just charted on the pt to the SecureChat. Christine Chrisco.

## 2021-02-17 NOTE — ED Notes (Signed)
1 personal property bag locked  in purple zone locker #1.

## 2021-02-17 NOTE — ED Triage Notes (Addendum)
Pt ot ED via [employer] IVC. Pt was outside mopping the street in the rain, disrobing in the streets. Hx of schizophrenia, off medications. Pt speech rapid, tangential, anxious, unable to focus on one topic, manic and restless. Pt appears to be responding to internal stimuli.  Pt able to follow simple directions with much encouragement.

## 2021-02-18 DIAGNOSIS — F29 Unspecified psychosis not due to a substance or known physiological condition: Secondary | ICD-10-CM | POA: Diagnosis not present

## 2021-02-18 LAB — VALPROIC ACID LEVEL: Valproic Acid Lvl: <10 ug/mL — ABNORMAL LOW (ref 50.0–100.0)

## 2021-02-18 MED ORDER — OLANZAPINE 5 MG PO TABS
5.0000 mg | ORAL_TABLET | Freq: Two times a day (BID) | ORAL | Status: DC
  Administered 2021-02-18 – 2021-02-20 (×5): 5 mg via ORAL
  Filled 2021-02-18 (×6): qty 1

## 2021-02-18 MED ORDER — STERILE WATER FOR INJECTION IJ SOLN
INTRAMUSCULAR | Status: AC
  Filled 2021-02-18: qty 10

## 2021-02-18 MED ORDER — ZIPRASIDONE MESYLATE 20 MG IM SOLR
INTRAMUSCULAR | Status: AC
  Administered 2021-02-18: 20 mg via INTRAMUSCULAR
  Filled 2021-02-18: qty 20

## 2021-02-18 MED ORDER — ZIPRASIDONE MESYLATE 20 MG IM SOLR
INTRAMUSCULAR | Status: AC
  Filled 2021-02-18: qty 20

## 2021-02-18 NOTE — ED Notes (Signed)
ED Provider at bedside. 

## 2021-02-18 NOTE — ED Notes (Signed)
The pt is a little more subdued this am  she does not appear as manic

## 2021-02-18 NOTE — BH Assessment (Signed)
Clinician messaged Cyndia Skeeters, RN, "Hey. It's Trey with TTS. Is the pt able to engage in the assessment, if so the pt will need to be placed in a private room. Also is the pt under IVC?"   Clinician awaiting response.    Vertell Novak, [place], [facility], [facility] Triage Specialist [phone]

## 2021-02-18 NOTE — ED Notes (Signed)
Breakfast orders Placed °

## 2021-02-18 NOTE — BH Assessment (Signed)
Comprehensive Clinical Assessment (CCA) Note  02/18/2021 [name] [name] [id number]  Disposition: Quintella Reichert, NP recommends inpatient treatment. Larose Kells, RN to review, if no available beds CSW to seek placement. Disposition discussed with Cyndia Skeeters, RN.   [facility] from 02/17/2021 in [facility] from 11/27/2020 in [facility] from 10/09/2020 in [facility]  C-SSRS RISK CATEGORY No Risk No Risk Error: Q3, 4, or 5 should not be populated when Q2 is No      The patient demonstrates the following risk factors for suicide: Chronic risk factors for suicide include: psychiatric disorder of Schizoaffective Disorder, Bipolar Type ([place]) . Acute risk factors for suicide include:  Pt denies . Protective factors for this patient include: positive social support. Considering these factors, the overall suicide risk at this point appears to be no risk. Patient is not appropriate for outpatient follow up.  [name] [name] is a 41 year old female who presents involuntary and unaccompanied to [facility]. Clinician asked the pt, "what brought you to the hospital?" Pt reports, "they lied on me said I was outside without clothes on." Pt reports, she was outside gardening waiting on her landlord to unlock her bathroom but was handcuffed and taken to the hospital. Pt reports, she had a dress on. Pt reports, "don't be made at my accent," as she changed to speaking in a [place] accent. Pt continued to yell that she was waiting on her landlord to unlock her bathroom, she had clothes on and wanted to go home. Pt reports, it was unfair that the cop handcuffed in front of her door. When clinician attempted to asked questions pt would reply, "that's none your business, he has nothing to do with it, etc."  Pt denies, SI, HI, AVH, self-injurious behaviors and access to weapons.   Pt was IVC'd by  her pastor ([place], [phone]). Per IVC paperwork: "[name] is diagnosis as Bipolar/Schizophrenia. [name] os off her medication. [name] is very aggressive, she is standing in the middle of the street in the rain. [name] is talking out of her head, saying things that don't make sense. [name] grabbed a pair of sheers and started towards the officers with them. [name] is danger to herself an others at this time and needs to be evaluated for possible mental illness."   Pt denies, substance use. Pt's UDS is pending. Pt reports, she does not need to see a psychiatrist or therapist. Pt reports, her doctor in [place] prescribed her Zyprexa but was prescribed Depakote by providers. Pt has previous inpatient admissions.   Pt presents alert with loud speech. During the assessment pt changed her accent to a [place] accent.  Pt reports, she from [place]. Pt's mood, was irritable. Pt's affect was congruent. Pt's insight was lacking. Pt's judgement is impaired. Pt reports, she wants to go home.   Diagnosis: Schizoaffective Disorder, Bipolar Type ([place]).  *Clinician attempted to contact IVC petitioner. Clinician did not leave a voice message and there was identifying information.*  Chief Complaint:  Chief Complaint  Patient presents with   IVC   Visit Diagnosis:     CCA Screening, Triage and Referral (STR)  Patient Reported Information How did you hear about [place]? Legal System  What Is the Reason for Your Visit/Call Today? Per EDP note: "Patient has a history of bipolar disorder, schizophrenia and diabetes according to the medical records. Patient was brought into the ED for acute agitation and abnormal  behavior. Patient was found outside mopping the street in the rain. She was undressing herself and taking off her close in the middle of the street. [employer] placed the patient under IVC. Patient's speech was very tangential and she was anxious and hard to understand. In the  ED it is hard to get the patient to tell me exactly what brought her here. 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  She asked to speak to a grieving service. She would like to leave."  How Long Has This Been Causing You Problems? 1-6 months  What Do You Feel Would Help You the Most Today? Medication(s)   Have You Recently Had Any Thoughts About Hurting Yourself? No  Are You Planning to Commit Suicide/Harm Yourself At This time? No   Have you Recently Had Thoughts About [place]? No (Pt denies.)  Are You Planning to Harm Someone at This Time? No  Explanation: No data recorded  Have You Used Any Alcohol or Drugs in the Past 24 Hours? No  How Long Ago Did You Use Drugs or Alcohol? No data recorded What Did You Use and How Much? No data recorded  Do You Currently Have a Therapist/Psychiatrist? No (Pt reports, she does not need one.)  Name of Therapist/Psychiatrist: Pt was unable to provide the name of her psychiatrist and counselor.   Have You Been Recently Discharged From Any Office Practice or Programs? No data recorded Explanation of Discharge From Practice/Program: No data recorded    CCA Screening Triage Referral Assessment Type of Contact: Tele-Assessment  Telemedicine Service Delivery: Telemedicine service delivery: This service was provided via telemedicine using a 2-way, interactive audio and video technology  Is this Initial or Reassessment? Initial Assessment  Date Telepsych consult ordered in [facility]:  02/17/21  Time Telepsych consult ordered in [facility]:  1417  Location of Assessment: [facility] ED  Provider Location: [facility] Assessment Services   Collateral Involvement: Pt was IVC'd by her pastor ([place], [phone]).   Does Patient Have a Stage manager Guardian? No data recorded Name and Contact of Legal Guardian: No data recorded If Minor and Not Living with Parent(s), Who has Custody? No data recorded Is CPS involved or ever been involved?  No data recorded Is APS involved or ever been involved? No data recorded  Patient Determined To Be At Risk for Harm To Self or Others Based on Review of Patient Reported Information or Presenting Complaint? Yes, for Self-Harm  Method: No data recorded Availability of Means: No data recorded Intent: No data recorded Notification Required: No data recorded Additional Information for Danger to Others Potential: No data recorded Additional Comments for Danger to Others Potential: No data recorded Are There Guns or Other Weapons in Your Home? No data recorded Types of Guns/Weapons: No data recorded Are These Weapons Safely Secured?                            No data recorded Who Could Verify You Are Able To Have These Secured: No data recorded Do You Have any Outstanding Charges, Pending Court Dates, Parole/Probation? No data recorded Contacted To Inform of Risk of Harm To Self or Others: Law Enforcement    Does Patient Present under Involuntary Commitment? Yes  IVC Papers Initial File Date: 02/17/21   [place] of Residence: [place]   Patient Currently Receiving the Following Services: Not Receiving Services   Determination of Need: Emergent (2 hours)   Options For  Referral: Inpatient Hospitalization     CCA Biopsychosocial Patient Reported Schizophrenia/Schizoaffective Diagnosis in Past: Yes   Strengths: UTA   Mental Health Symptoms Depression:   None (Pt denies.)   Duration of Depressive symptoms:    Mania:   None   Anxiety:    None   Psychosis:   None (Pt denies.)   Duration of Psychotic symptoms:    Trauma:   None (Pt denies.)   Obsessions:   None   Compulsions:   None   Inattention:   None   Hyperactivity/Impulsivity:   None   Oppositional/Defiant Behaviors:   Angry   Emotional Irregularity:   Potentially harmful impulsivity   Other Mood/Personality Symptoms:  No data recorded   Mental Status Exam Appearance and self-care  Stature:    Average   Weight:   Average weight   Clothing:   -- (Pt in scrubs.)   Grooming:   Normal   Cosmetic use:   None   Posture/gait:   Other (Comment) (Pt sitting up in bed.)   Motor activity:   Agitated   Sensorium  Attention:   Inattentive   Concentration:   Focuses on irrelevancies   Orientation:   Person; Place; Situation   Recall/memory:   Defective in Immediate   Affect and Mood  Affect:   Congruent   Mood:   Anxious; Irritable   Relating  Eye contact:   Normal   Facial expression:   Anxious   Attitude toward examiner:   Irritable; Guarded   Thought and Language  Speech flow:  Loud   Thought content:  No data recorded  Preoccupation:   Other (Comment) (Pt wants to go home and she was not taking her clothes off outside.)   Hallucinations:   None   Organization:  No data recorded  [employer] of Knowledge:   Average   Intelligence:   Average   Abstraction:  No data recorded  Judgement:   Impaired   Reality Testing:   Distorted   Insight:   Lacking   Decision Making:   Impulsive   Social Functioning  Social Maturity:   Impulsive   Social Judgement:   Heedless   Stress  Stressors:   Other (Comment) (Pt denies.)   Coping Ability:   Overwhelmed   Skill Deficits:   Decision making; Self-control   Supports:   Church     Religion: Religion/Spirituality Are You A Religious Person?:  Special educational needs teacher)  Leisure/Recreation: Leisure / Recreation Do You Have Hobbies?: Yes Leisure and Hobbies: Gardening.  Exercise/Diet: Exercise/Diet Do You Exercise?:  (UTA) Have You Gained or Lost A Significant Amount of Weight in the Past Six Months?:  (UTA) Do You Follow a Special Diet?: No Do You Have Any Trouble Sleeping?:  (Pt reports, it depends.)   CCA Employment/Education Employment/Work Situation: Employment / Work Situation Employment Situation:  Special educational needs teacher) Has Patient ever Been in Passenger transport manager?:   Special educational needs teacher)  Education: Education Is Patient Currently Attending School?:  (UTA) Last Grade Completed:  (UTA) Did You Attend College?:  (UTA)   CCA Family/Childhood History Family and Relationship History: Family history Marital status: Single Does patient have children?: Yes How many children?: 2 How is patient's relationship with their children?: Pt did not want to provide additional information  Childhood History:  Childhood History By whom was/is the patient raised?:  (UTA) Did patient suffer any verbal/emotional/physical/sexual abuse as a child?: No (Pt denies.) Did patient suffer from severe childhood neglect?: No (Pt denies.) Has patient ever  been sexually abused/assaulted/raped as an adolescent or adult?: No (Pt denies.) Witnessed domestic violence?: No (Pt denies.) Has patient been affected by domestic violence as an adult?:  (NA)  Child/Adolescent Assessment:     CCA Substance Use Alcohol/Drug Use: Alcohol / Drug Use Pain Medications: See MAR Prescriptions: See MAR Over the Counter: See MAR History of alcohol / drug use?: No history of alcohol / drug abuse (Pt denies.)     ASAM's:  Six Dimensions of Multidimensional Assessment  Dimension 1:  Acute Intoxication and/or Withdrawal Potential:      Dimension 2:  Biomedical Conditions and Complications:      Dimension 3:  Emotional, Behavioral, or Cognitive Conditions and Complications:     Dimension 4:  Readiness to Change:     Dimension 5:  Relapse, Continued use, or Continued Problem Potential:     Dimension 6:  Recovery/Living Environment:     ASAM Severity Score:    ASAM Recommended Level of Treatment:     Substance use Disorder (SUD)    Recommendations for Services/Supports/Treatments: Recommendations for Services/Supports/Treatments Recommendations For Services/Supports/Treatments: Inpatient Hospitalization  Discharge Disposition:    DSM5 Diagnoses: Patient Active Problem List   Diagnosis Date  Noted   Manic behavior ([place]) 10/09/2020   Psychosis ([place]) 07/13/2019   Labor without complication 42/35/3614   Indication for care in labor or delivery 07/10/2019   Third trimester pregnancy 07/06/2019   [redacted] weeks gestation of pregnancy    AMA (advanced maternal age) multigravida 35+, third trimester 07/04/2019   Supervision of high risk pregnancy, antepartum 07/04/2019   No prenatal care in current pregnancy in third trimester 07/04/2019   Obesity in pregnancy 07/04/2019   BMI 30s 07/04/2019   History of cesarean delivery 07/04/2019   Short interval between pregnancies affecting pregnancy in third trimester, antepartum 07/04/2019   Adjustment disorder with mixed disturbance of emotions and conduct 07/11/2017   Acute psychosis ([place]) 12/21/2015   Insomnia    Anxiety state    Overactive bladder    Diabetes mellitus ([place]) 02/08/2015   Schizoaffective disorder, bipolar type ([place]) 01/28/2015   Non compliance w medication regimen     Referrals to Alternative Service(s): Referred to Alternative Service(s):   Place:   Date:   Time:    Referred to Alternative Service(s):   Place:   Date:   Time:    Referred to Alternative Service(s):   Place:   Date:   Time:    Referred to Alternative Service(s):   Place:   Date:   Time:     Vertell Novak, [facility] Comprehensive Clinical Assessment (CCA) Screening, Triage and Referral Note  02/18/2021 [name] [name] [id number]  Chief Complaint:  Chief Complaint  Patient presents with   IVC   Visit Diagnosis:   Patient Reported Information How did you hear about [place]? Legal System  What Is the Reason for Your Visit/Call Today? Per EDP note: "Patient has a history of bipolar disorder, schizophrenia and diabetes according to the medical records. Patient was brought into the ED for acute agitation and abnormal behavior. Patient was found outside mopping the street in the rain. She was undressing herself and taking off her close in the middle of  the street. [employer] placed the patient under IVC. Patient's speech was very tangential and she was anxious and hard to understand. In the ED it is hard to get the patient to tell me exactly what brought her here. 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  She asked to speak  to a grieving service. She would like to leave."  How Long Has This Been Causing You Problems? 1-6 months  What Do You Feel Would Help You the Most Today? Medication(s)   Have You Recently Had Any Thoughts About Hurting Yourself? No  Are You Planning to Commit Suicide/Harm Yourself At This time? No   Have you Recently Had Thoughts About [place]? No (Pt denies.)  Are You Planning to Harm Someone at This Time? No  Explanation: No data recorded  Have You Used Any Alcohol or Drugs in the Past 24 Hours? No  How Long Ago Did You Use Drugs or Alcohol? No data recorded What Did You Use and How Much? No data recorded  Do You Currently Have a Therapist/Psychiatrist? No (Pt reports, she does not need one.)  Name of Therapist/Psychiatrist: Pt was unable to provide the name of her psychiatrist and counselor.   Have You Been Recently Discharged From Any Office Practice or Programs? No data recorded Explanation of Discharge From Practice/Program: No data recorded   CCA Screening Triage Referral Assessment Type of Contact: Tele-Assessment  Telemedicine Service Delivery: Telemedicine service delivery: This service was provided via telemedicine using a 2-way, interactive audio and video technology  Is this Initial or Reassessment? Initial Assessment  Date Telepsych consult ordered in [facility]:  02/17/21  Time Telepsych consult ordered in [facility]:  1417  Location of Assessment: [facility] ED  Provider Location: [facility] Assessment Services   Collateral Involvement: Pt was IVC'd by her pastor ([place], [phone]).   Does Patient Have a Stage manager Guardian? No data recorded Name and Contact of Legal Guardian:  No data recorded If Minor and Not Living with Parent(s), Who has Custody? No data recorded Is CPS involved or ever been involved? No data recorded Is APS involved or ever been involved? No data recorded  Patient Determined To Be At Risk for Harm To Self or Others Based on Review of Patient Reported Information or Presenting Complaint? Yes, for Self-Harm  Method: No data recorded Availability of Means: No data recorded Intent: No data recorded Notification Required: No data recorded Additional Information for Danger to Others Potential: No data recorded Additional Comments for Danger to Others Potential: No data recorded Are There Guns or Other Weapons in Your Home? No data recorded Types of Guns/Weapons: No data recorded Are These Weapons Safely Secured?                            No data recorded Who Could Verify You Are Able To Have These Secured: No data recorded Do You Have any Outstanding Charges, Pending Court Dates, Parole/Probation? No data recorded Contacted To Inform of Risk of Harm To Self or Others: Law Enforcement   Does Patient Present under Involuntary Commitment? Yes  IVC Papers Initial File Date: 02/17/21   [place] of Residence: [place]   Patient Currently Receiving the Following Services: Not Receiving Services   Determination of Need: Emergent (2 hours)   Options For Referral: Inpatient Hospitalization   Discharge Disposition:     Vertell Novak, [place], [place], [facility], [facility] Triage Specialist [phone]

## 2021-02-18 NOTE — ED Provider Notes (Signed)
I was notified that patient had to be retracted because of violent restraints.  Patient has been given medications.  She is currently sleeping and sedated.  She is in restraints at this time we will go ahead and remove as she is clearly calm and sedated at this time.  Certainly patient may require restraints again if she does not respond to verbal de-escalation and medications.  She has had repetitive agitation and aggression while she has been in the emergency department.   Dorie Rank, MD 02/18/21 1214

## 2021-02-18 NOTE — Progress Notes (Signed)
°   02/18/21 1830  Clinical Encounter Type  Visited With Patient not available  Visit Type Initial;Spiritual support  Referral From Nurse  Consult/Referral To Chaplain   Chaplain Jorene Guest responded to page request for a bible. The patient was not in the room. Chaplain Tim Corriher left the bible on the bedside table. This note was prepared by Jeanine Luz, M.Div..  For questions please contact by phone [phone].

## 2021-02-18 NOTE — Progress Notes (Signed)
[facility] LCSW Progress Note   02/18/2021    1:53 PM  [name] [name]   [id number]   Type of Contact and Topic: Psychiatric Bed Placement    Patient meets inpatient criteria per Quintella Reichert, NP. Patient referred to the following facilities:  Destination Service Provider Request Status Address Phone Fax  [facility] [address] [place] [address] [phone]  [facility] [address] [place] [address] [phone] [place], [place] [address] [phone] [facility] [address] Jeanene Erb [place] [address] [phone]  [facility] [address] [place] [address] [phone] [place], [place] [address] [phone]  [facility] [address] Diamantina Monks [place] [address] [phone]  [facility]  Pending - Request Sent [address] [place] [address] [phone]    CSW will continue to monitor disposition.   Signed:  Durenda Hurt, MSW, [place], LCASA 02/18/2021 1:53 PM

## 2021-02-19 DIAGNOSIS — F250 Schizoaffective disorder, bipolar type: Secondary | ICD-10-CM | POA: Diagnosis not present

## 2021-02-19 DIAGNOSIS — F29 Unspecified psychosis not due to a substance or known physiological condition: Secondary | ICD-10-CM | POA: Diagnosis not present

## 2021-02-19 LAB — CBG MONITORING, ED: Glucose-Capillary: 97 mg/dL (ref 70–99)

## 2021-02-19 MED ORDER — DIVALPROEX SODIUM 125 MG PO CSDR
250.0000 mg | DELAYED_RELEASE_CAPSULE | Freq: Every day | ORAL | Status: DC
  Administered 2021-02-19 – 2021-02-20 (×2): 250 mg via ORAL
  Filled 2021-02-19 (×2): qty 2

## 2021-02-19 MED ORDER — LORAZEPAM 1 MG PO TABS
1.0000 mg | ORAL_TABLET | Freq: Two times a day (BID) | ORAL | Status: DC
  Administered 2021-02-19 – 2021-02-20 (×3): 1 mg via ORAL
  Filled 2021-02-19 (×3): qty 1

## 2021-02-19 MED ORDER — LORAZEPAM 1 MG PO TABS: 1.0000 mg | ORAL_TABLET | Freq: Once | ORAL | Status: DC | PRN

## 2021-02-19 MED ORDER — ZIPRASIDONE MESYLATE 20 MG IM SOLR
20.0000 mg | Freq: Once | INTRAMUSCULAR | Status: AC
  Administered 2021-02-19: 20 mg via INTRAMUSCULAR
  Filled 2021-02-19: qty 20

## 2021-02-19 MED ORDER — STERILE WATER FOR INJECTION IJ SOLN
INTRAMUSCULAR | Status: AC
  Administered 2021-02-19: 1.2 mL
  Filled 2021-02-19: qty 10

## 2021-02-19 MED ORDER — STERILE WATER FOR INJECTION IJ SOLN
INTRAMUSCULAR | Status: AC
  Filled 2021-02-19: qty 10

## 2021-02-19 NOTE — Consult Note (Addendum)
Telepsych Consultation   Reason for Consult:  Psychiatric Reassessment Referring Physician:  EDP Location of Patient:    [name] [name] Location of Provider: Other: virtual home office  Patient Identification: [name] [name] MRN:  [id number] Principal Diagnosis: Schizoaffective disorder, bipolar type ([place]) Diagnosis:  Principal Problem:   Schizoaffective disorder, bipolar type ([place]) Active Problems:   Acute psychosis ([place])   Total Time spent with patient: 30 minutes  Subjective:   [name] [name] is a 41 y.o. female patient admitted with mania. Today she states, "I don't take medications for bipolar, this is as good as it gets."   HPI:   Patient seen via telepsych by this provider; chart reviewed and consulted with Dr. Serafina Mitchell on 02/19/21.  On evaluation [name] [name] is A&Ox3, reports, "I am taking the medications you gave me.  Now,I'm ready to go home."  Her speech is pressured, she is grandiose and demonstrates a flight of ideas.  During the course of the interview, pts mood quickly shifts from being cooperative to irritable, to being demanding and grandiose.  Per nursing she was agitated earlier today, threw a cup at the sitter.  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  Adamantly denies that she was naked, states her neighbor lied on her.  She requests this Probation officer to call her pastor, Danise Mina at [phone] who an validate she is at her baseline and should be discharged home.  States she is married but she does not want her husband called.    Patient continues to demonstrate manic symptoms, but is now able to engage in conversation with this Probation officer, and responds to verbal redirection which is an improvement since admission.  However, she is clearly not at baseline.  She has allergies to 11 psychotropic  medications, which has caused some limitations, slower progress with trying to manage her mania and agitation.  Of note,  most of these listed allergies are more of side effects and do not appear to be true allergic responses.  Patient previously prescribed depakote, no SOB, or rash but caused her to be sleepy.  In her case of acute sub acute mania, sleep would be beneficial.  Patient last took  depakote 2 months ago while in the ED and tolerated it well; medication is also prescribed for outpatient usage.  Her current valporic acid levels are >10.  Will resume depakote today as adjunct to olanzapine for mood stability.  This was discussed with the patient who voiced her understanding and agrees to this but ruminates on going home. Per nursing she takes po meds, tolerates well, sleep remains poor-fair, appetite is good.   Past Psychiatric History: as outlined below  Risk to Self:  yes Risk to Others:  yes Prior Inpatient Therapy:  yes Prior Outpatient Therapy:  unknown  Past Medical History:  Past Medical History:  Diagnosis Date   Bipolar affective disorder, currently manic, mild ([place])    Diabetes mellitus without complication ([place])    Schizophrenia ([place])     Past Surgical History:  Procedure Laterality Date   CESAREAN SECTION  04/2018   WISDOM TOOTH EXTRACTION     Family History:  Family History  Problem Relation Age of Onset   Drug abuse Maternal Uncle    Family Psychiatric  History: as outlined above Social History:  Social History  Substance and Sexual Activity  Alcohol Use Not Currently     Social History   Substance and Sexual Activity  Drug Use Not Currently    Social History   Socioeconomic History   Marital status: Legally Separated    Spouse name: Not on file   Number of children: Not on file   Years of education: Not on file   Highest education level: Not on file  Occupational History   Not on file  Tobacco Use   Smoking status: Never   Smokeless  tobacco: Never  Vaping Use   Vaping Use: Never used  Substance and Sexual Activity   Alcohol use: Not Currently   Drug use: Not Currently   Sexual activity: Not Currently  Other Topics Concern   Not on file  Social History Narrative   ** Merged History Encounter **       ** Merged History Encounter **       Social Determinants of Health   Financial Resource Strain: Not on file  Food Insecurity: Not on file  Transportation Needs: Not on file  Physical Activity: Not on file  Stress: Not on file  Social Connections: Not on file   Additional Social History:    Allergies:   Allergies  Allergen Reactions   Quetiapine Anaphylaxis    "Pass out"   Gabapentin Other (See Comments)    "Feet on fire"   Lorazepam Other (See Comments)    unknown   Risperidone Other (See Comments)    Pt reports "It makes me blind"    Trazodone And Nefazodone    Valproic Acid Other (See Comments)    Pt reports "it makes me too sleepy"    Aripiprazole Other (See Comments)    Makes patient "too tired"   Fluphenazine Rash   Haloperidol Other (See Comments)    Makes feel hot inside. "Tires my tongue"    Trazodone Other (See Comments)   Ziprasidone Hcl Palpitations    Labs:  Results for orders placed or performed during the hospital encounter of 02/17/21 (from the past 48 hour(s))  Resp Panel by RT-PCR (Flu A&B, Covid) Nasopharyngeal Swab     Status: None   Collection Time: 02/17/21  9:35 PM   Specimen: Nasopharyngeal Swab; Nasopharyngeal(NP) swabs in vial transport medium  Result Value Ref Range   SARS Coronavirus 2 by RT PCR NEGATIVE NEGATIVE    Comment: (NOTE) SARS-CoV-2 target nucleic acids are NOT DETECTED.  The SARS-CoV-2 RNA is generally detectable in upper respiratory specimens during the acute phase of infection. The lowest concentration of SARS-CoV-2 viral copies this assay can detect is 138 copies/mL. A negative result does not preclude SARS-Cov-2 infection and should not be  used as the sole basis for treatment or other patient management decisions. A negative result may occur with  improper specimen collection/handling, submission of specimen other than nasopharyngeal swab, presence of viral mutation(s) within the areas targeted by this assay, and inadequate number of viral copies(<138 copies/mL). A negative result must be combined with clinical observations, patient history, and epidemiological information. The expected result is Negative.  Fact Sheet for Patients:  EntrepreneurPulse.com.au  Fact Sheet for Healthcare Providers:  IncredibleEmployment.be  This test is no t yet approved or cleared by the [place] FDA and  has been authorized for detection and/or diagnosis of SARS-CoV-2 by FDA under an Emergency Use Authorization (EUA). This EUA will remain  in effect (meaning this test can be used) for the duration of the COVID-19 declaration under  Section 564(b)(1) of the Act, 21 U.S.C.section 360bbb-3(b)(1), unless the authorization is terminated  or revoked sooner.       Influenza A by PCR NEGATIVE NEGATIVE   Influenza B by PCR NEGATIVE NEGATIVE    Comment: (NOTE) The Xpert Xpress SARS-CoV-2/FLU/RSV plus assay is intended as an aid in the diagnosis of influenza from Nasopharyngeal swab specimens and should not be used as a sole basis for treatment. Nasal washings and aspirates are unacceptable for Xpert Xpress SARS-CoV-2/FLU/RSV testing.  Fact Sheet for Patients: EntrepreneurPulse.com.au  Fact Sheet for Healthcare Providers: IncredibleEmployment.be  This test is not yet approved or cleared by the [place] FDA and has been authorized for detection and/or diagnosis of SARS-CoV-2 by FDA under an Emergency Use Authorization (EUA). This EUA will remain in effect (meaning this test can be used) for the duration of the COVID-19 declaration under Section 564(b)(1) of the  Act, 21 U.S.C. section 360bbb-3(b)(1), unless the authorization is terminated or revoked.  Performed at [facility], [place] [address] [place], [place] [address]   Valproic acid level     Status: Abnormal   Collection Time: 02/18/21 10:53 AM  Result Value Ref Range   Valproic Acid Lvl <10 (L) 50.0 - 100.0 ug/mL    Comment: RESULTS CONFIRMED BY MANUAL DILUTION Performed at [facility] [address], [place], [place] [address]   CBG monitoring, ED     Status: None   Collection Time: 02/19/21 11:13 AM  Result Value Ref Range   Glucose-Capillary 97 70 - 99 mg/dL    Comment: Glucose reference range applies only to samples taken after fasting for at least 8 hours.    Medications:  Current Facility-Administered Medications  Medication Dose Route Frequency Provider Last Rate Last Admin   divalproex (DEPAKOTE SPRINKLE) capsule 250 mg  250 mg Oral Daily Merlyn Lot E, NP   250 mg at 02/19/21 0947   LORazepam (ATIVAN) tablet 1 mg  1 mg Oral BID Mallie Darting, NP       OLANZapine (ZYPREXA) tablet 5 mg  5 mg Oral BID Rankin, Shuvon B, NP   5 mg at 02/19/21 0947   OLANZapine zydis (ZYPREXA) disintegrating tablet 5 mg  5 mg Oral Q8H PRN Pattricia Boss, MD   5 mg at 02/19/21 7253   Current Outpatient Medications  Medication Sig Dispense Refill   divalproex (DEPAKOTE ER) 250 MG 24 hr tablet Take 1 tablet (250 mg total) by mouth every morning. (Patient not taking: Reported on 02/17/2021) 30 tablet 0   divalproex (DEPAKOTE ER) 500 MG 24 hr tablet Take 1 tablet (500 mg total) by mouth at bedtime. (Patient not taking: Reported on 02/17/2021) 30 tablet 0   metoprolol succinate (TOPROL-XL) 25 MG 24 hr tablet Take 1 tablet (25 mg total) by mouth daily. (Patient not taking: Reported on 10/09/2020) 30 tablet 0   OLANZapine (ZYPREXA) 15 MG tablet Take 1 tablet (15 mg total) by mouth daily. (Patient not taking: Reported on 02/17/2021) 30 tablet 0   Prenatal Vit-Fe Fumarate-FA (PRENATAL  MULTIVITAMIN) TABS tablet Take 1 tablet by mouth daily at 12 noon. (Patient not taking: Reported on 09/21/2020) 30 tablet 0   spironolactone (ALDACTONE) 25 MG tablet Take 0.5 tablets (12.5 mg total) by mouth daily. (Patient not taking: Reported on 10/09/2020) 15 tablet 0    Musculoskeletal:  Patient seen on video walking around in her room.  No ambulatory concerns.  Strength & Muscle Tone: within normal limits Gait & Station: normal  Patient leans: N/A    Psychiatric Specialty Exam:  Presentation  General Appearance: Bizarre (patient dressed in hospital scrubs and hospital gown;)  Eye Contact:Good  Speech:Pressured  Speech Volume:Normal  Handedness:Right   Mood and Affect  Mood:Dysphoric; Anxious  Affect:Congruent; Constricted   Thought Process  Thought Processes:Irrevelant; Disorganized  Descriptions of Associations:Circumstantial  Orientation:Full (Time, Place and Person)  Thought Content:Illogical; Rumination  History of Schizophrenia/Schizoaffective disorder:Yes  Duration of Psychotic Symptoms:Greater than six months  Hallucinations:Hallucinations: None  Ideas of Reference:Delusions  Suicidal Thoughts:Suicidal Thoughts: No  Homicidal Thoughts:Homicidal Thoughts: No   Sensorium  Memory:Recent Fair; Remote Fair; Immediate Good  Judgment:Impaired  Insight:Lacking   Executive Functions  Concentration:Poor  Attention Span:Poor  [place]  Language:Good   Psychomotor Activity  Psychomotor Activity:Psychomotor Activity: Increased (pt is moving from standing to sitting and walking around room)   Assets  Assets:Communication Skills; Housing; Financial Resources/Insurance   Sleep  Sleep:Sleep: Fair Number of Hours of Sleep: 5    Physical Exam: Physical Exam Cardiovascular:     Rate and Rhythm: Normal rate.     Pulses: Normal pulses.  Pulmonary:     Effort: Pulmonary effort is normal.  Musculoskeletal:      Cervical back: Normal range of motion.  Neurological:     General: No focal deficit present.     Mental Status: She is alert and oriented to person, place, and time.  Psychiatric:        Attention and Perception: Perception normal. She is inattentive.        Mood and Affect: Mood is anxious.        Speech: Speech is rapid and pressured.        Behavior: Behavior is hyperactive.        Thought Content: Thought content is delusional. Thought content does not include homicidal or suicidal ideation. Thought content does not include homicidal or suicidal plan.        Cognition and Memory: Cognition normal.        Judgment: Judgment is impulsive and inappropriate.   Review of Systems  Constitutional: Negative.   HENT: Negative.    Eyes: Negative.   Respiratory: Negative.    Cardiovascular: Negative.   Gastrointestinal: Negative.   Genitourinary: Negative.   Musculoskeletal: Negative.   Skin: Negative.   Neurological: Negative.   Endo/Heme/Allergies: Negative.   Psychiatric/Behavioral:  The patient is nervous/anxious.   Blood pressure 118/89, pulse (!) 118, temperature 99.2 F (37.3 C), resp. rate (!) 22, SpO2 100 %, unknown if currently breastfeeding. There is no height or weight on file to calculate BMI.  Treatment Plan Summary: Patient continues to demonstrate manic symptoms, but is able to engage in conversation with this Probation officer, now responding to verbal redirection which is an improvement since admission but she is clearly not at baseline.  She has allergies to 11 psychotropic medications, but most of these are more of side effects and do not appear to be true allergic responses.  Patient previously prescribed depakote, no SOB, rash but caused her to be sleepy.  In her case of acute sub acute mania, sleep would be beneficial.  Patient last took this depakote 2 months ago while in the ED and tolerated well; medication is also prescribed for outpatient usage.  Her current valporic acid  levels are >12.  Will resume depakote today as adjunct to olanzapine for mood stability.   Her sleep is fair, would benefit from sleep aid.  This was discussed with the patient who  voiced her understanding but ruminates on going home.   Daily contact with patient to assess and evaluate symptoms and progress in treatment and Medication management.  Ekg QT/QTC intervals 375/416; Negative pregnancy test; LFTs WNL; glucose elevated at 133mg /dl but pt has hx of DM.   Continue: Olanzapine 5mg  po BID for mood Olanzapine zydis 5mg  po q8h prn severe agitation  Start:  Divalproex capsule 250mg  po daily as adjunct for mood stability Lorazepam 1mg  po BID for anxiety/insomnia  Disposition: Recommend psychiatric Inpatient admission when medically cleared.  This service was provided via telemedicine using a 2-way, interactive audio and video technology.  Names of all persons participating in this telemedicine service and their role in this encounter. Name: Marvin Maenza Role: Patient  Name: Merlyn Lot Role: PMHNP    Mallie Darting, NP 02/19/2021 2:36 PM

## 2021-02-19 NOTE — ED Notes (Signed)
Patient woke up agitated and started pushing staff computer wheels around and threaten to break it; pt is having flight of ideas and hostile stance. Patient took PRN from RN without incident-Monique,RN

## 2021-02-19 NOTE — ED Notes (Signed)
Pt was taking an unusually long time in the shower.  When staff checked on her, Pt started screaming and yelling.  Pt made racial and derogatory comments and several accusations about one of the sitters.  Staff could not rationalize w/ Pt and Pt would not de-escalate w/ verbal de-escalation techniques.

## 2021-02-19 NOTE — ED Provider Notes (Signed)
Was contacted by RN who reports that patient became agitated and violent this morning throwing things at RN.  Geodon was ordered.  Previous EKG and medications were reviewed prior to placing this order.   Loni Beckwith, PA-C 02/19/21 1820    Orpah Greek, MD 02/19/21 [phone]

## 2021-02-19 NOTE — Progress Notes (Signed)
Patient has been faxed out. Patient meets [facility] inpatient criteria per [facility] Bobbitt,NP. Patient has been faxed out to the following facilities:    [place] [place] [address] [phone]  [facility]  [address], [place] [address] [phone] [place], [place] [address] [phone] [place]  [address] [place] [address] [phone] [place]  [address] [place] [address] [phone] [facility]  [address], [place] [address] [place]  [facility]  [address] [place] [address] [phone]  [facility]  [address] [place] [address] [phone] [place], MSW, LCSW-A  12:42 PM 02/19/2021

## 2021-02-19 NOTE — ED Notes (Signed)
Pt requested to have her blood sugar checked.  Pt reports she was told by someone she might be diabetic.  Additionally, Pt continues to ask for a referral to a gynecologist.

## 2021-02-19 NOTE — ED Notes (Addendum)
Pt continues to be very loud, manic, and hyper religious.  Pt requesting to take a shower.  Sitter informed Pt can take a shower after breakfast.

## 2021-02-19 NOTE — ED Provider Notes (Signed)
Patient with aggressive behavior, agitation, escalating with nursing staff.  Has required frequent doses of Geodon for similar- last dose 06:42AM, has had 20 mg in the last 24 hours, last Qtc < 450--> 20 mg of Geodon ordered IM at this time.    Leafy Kindle 02/19/21 2234    Davonna Belling, MD 02/19/21 2350

## 2021-02-19 NOTE — ED Notes (Signed)
Pt took medications, but voiced concerns about not having the Depakote at home.  Additionally, she wanted to make this writer aware that she has been "lied on and was never naked in the street."  Pt is adamant she was wearing a dress.

## 2021-02-19 NOTE — ED Notes (Signed)
Pt looked to be sleep and sitter entered room to discard breakfast tray.  When sitter touched tray, Pt thew a cup of coffee at her.  Pt then proceeded to get up and push/toss bedside table over.  EDP made aware.

## 2021-02-19 NOTE — ED Notes (Addendum)
Per Shelton Silvas, Pt accepted at [facility] 02/20/21 after 2000 hours, Number for report [phone] and [phone]

## 2021-02-19 NOTE — ED Notes (Signed)
Pt ask nurses' station asking for several random phone numbers, including numbers for businesses in [place].  Pt is concerned about her work status.  Also, Pt began asking about getting connected with an eye doctor and someone to perform a hysterectomy.  Pt informed we would not be able to make these referrals at this time.

## 2021-02-19 NOTE — Progress Notes (Addendum)
Pt was accepted to [facility] 02/20/2021 after 9am; Bed [employer].  Pt meets inpatient criteria per Shalon Bobbitt,NP.   Attending Physician will be Dr. Jonelle Sports   Report can be called to: [phone] or [phone]  Nursing notified: Karolee Stamps, RN  Nadara Mode, [place] 02/19/2021 @ 7:52 PM

## 2021-02-19 NOTE — ED Notes (Signed)
Pt woke up and came out of the room and has been acting manic and aggressive toward staff, Pt did take her prescribed oral medications and was given 20 mg of geodon as per mar. Pt will not stay in room, pt is defiant and not following staff requests.

## 2021-02-19 NOTE — ED Notes (Signed)
Pt walked up to nurses' station and reports we have the wrong address on file.  However, she is unable to tell this Probation officer her correct address.

## 2021-02-19 NOTE — ED Notes (Signed)
Patient came to desk agitated after RN asked her to go back to her; Pt attempted to pick up the computer and was able to push it on the RN; RN obtain emergency med ordered and was administered with [employer] and security present without incident.-Monique,RN

## 2021-02-19 NOTE — ED Notes (Addendum)
EDP at beside to assess Pt.  Security remains at bedside.

## 2021-02-19 NOTE — ED Provider Notes (Signed)
Emergency Medicine Observation Re-evaluation Note  [name] [name] is a 41 y.o. female, seen on rounds at 0830 today.  Pt initially presented to the ED for complaints of IVC Currently, the patient is mildly agitated.  Physical Exam  BP 118/89    Pulse (!) 118    Temp 99.2 F (37.3 C)    Resp (!) 22    SpO2 100%  Physical Exam General: NAD   ED Course / MDM  EKG:EKG Interpretation  Date/Time:  Monday February 18 2021 11:30:02 EST Ventricular Rate:  74 PR Interval:  158 QRS Duration: 73 QT Interval:  375 QTC Calculation: 416 R Axis:   74 Text Interpretation: Sinus arrhythmia Nonspecific T abnrm, anterolateral leads When compared with ECG of 02/17/2021, HEART RATE has decreased Confirmed by Delora Fuel ([id number]) on 02/18/2021 11:50:44 PM  I have reviewed the labs performed to date as well as medications administered while in observation.  Recent changes in the last 24 hours include agitation requiring chemical restraint.  Plan  Current plan is for inpatient placement. [name] [name] is under involuntary commitment.      Valarie Merino, MD 02/19/21 [phone]

## 2021-02-19 NOTE — ED Notes (Addendum)
Pt continues to be extremely manic and hyper-religious.  Pt is bouncing between conversations about previous careers, desired medications, and follow-up consults she would like at discharge.  Pt has requested to be check for medical issues from yeast infections to scoliosis.  However, Pt has no symptoms of a medical issue.  Additionally, Pt asking to be placed on birth control and would like peroxide body wash.      Pt would like to be referred to a dentist, PCP, gynecologist, and HUD.

## 2021-02-20 DIAGNOSIS — F29 Unspecified psychosis not due to a substance or known physiological condition: Secondary | ICD-10-CM | POA: Diagnosis not present

## 2021-02-20 NOTE — ED Notes (Signed)
Pt asked this morning if I could hand her more toothpaste after pt had already brushed her teeth. A little while later the pt asked another sitter if she could hand her more toothpaste. Pt stated she had already brushed her teeth and put the remainder of her toothpaste all over her face and that she needed more. Sitter responded Pt could not have more toothpaste currently as she still had some left. Pt. Proceeded to throw her toothpaste in the garbage and then went to her room.

## 2021-02-20 NOTE — ED Notes (Signed)
Pt is making 1 of her 2 phone calls at this time pt verbalizes understanding that she is allowed 2 call 5 mins

## 2021-02-20 NOTE — ED Notes (Signed)
Pt repeatedly coming out of room. Staff has asked pt to please stay in room. Pt rambling about religion, warrants, people looking at her naked, and some staff members who were here yesterday.

## 2021-02-20 NOTE — ED Provider Notes (Signed)
Emergency Medicine Observation Re-evaluation Note  [name] [name] is a 41 y.o. female, seen on rounds today.  Pt initially presented to the ED for complaints of IVC Currently, the patient is floridly manic.  She was speaking in an animated, rapid fashion, tangentially, without any cogent statements.  She is, however, pleasantly interactive.  She just received multiple sedatives, Zyprexa, Depakote, Ativan.  Physical Exam  BP (!) 158/105 (BP Location: Right Arm)    Pulse (!) 116    Temp 98 F (36.7 C) (Oral)    Resp 20    SpO2 98%  Physical Exam General: Animated, awake, alert, interactive, wearing a pair of scrub pants on her head as a turban Cardiac: Tachycardia when active Lungs: No increased work of breathing Psych: Floridly manic  ED Course / MDM  EKG:EKG Interpretation  Date/Time:  Monday February 18 2021 11:30:02 EST Ventricular Rate:  74 PR Interval:  158 QRS Duration: 73 QT Interval:  375 QTC Calculation: 416 R Axis:   74 Text Interpretation: Sinus arrhythmia Nonspecific T abnrm, anterolateral leads When compared with ECG of 02/17/2021, HEART RATE has decreased Confirmed by Delora Fuel ([id number]) on 02/18/2021 11:50:44 PM  I have reviewed the labs performed to date as well as medications administered while in observation.  Recent changes in the last 24 hours include episodes of agitation requiring sedation.  Plan  Current plan is for behavioral health placement.  Patient scheduled to transfer to [place]. [name] [name] is under involuntary commitment.      Carmin Muskrat, MD 02/20/21 1031

## 2021-02-20 NOTE — ED Notes (Signed)
Pt is up to shower at this time

## 2021-02-20 NOTE — ED Notes (Signed)
Verbalized that she is making her last phone call 2:2 today

## 2021-02-20 NOTE — ED Notes (Signed)
Breakfast Orders Placed °

## 2021-03-08 ENCOUNTER — Ambulatory Visit (HOSPITAL_COMMUNITY)
Admission: EM | Admit: 2021-03-08 | Discharge: 2021-03-09 | Disposition: A | Discharge status: Home / Self Care | Payer: Medicare Other | Attending: Urology | Admitting: Urology

## 2021-03-08 DIAGNOSIS — F309 Manic episode, unspecified: Secondary | ICD-10-CM | POA: Diagnosis not present

## 2021-03-08 DIAGNOSIS — Z20822 Contact with and (suspected) exposure to covid-19: Secondary | ICD-10-CM | POA: Diagnosis not present

## 2021-03-08 DIAGNOSIS — F3010 Manic episode without psychotic symptoms, unspecified: Secondary | ICD-10-CM | POA: Diagnosis not present

## 2021-03-08 DIAGNOSIS — Z91199 Patient's noncompliance with other medical treatment and regimen due to unspecified reason: Secondary | ICD-10-CM | POA: Diagnosis not present

## 2021-03-08 DIAGNOSIS — F250 Schizoaffective disorder, bipolar type: Secondary | ICD-10-CM | POA: Insufficient documentation

## 2021-03-08 MED ORDER — ALUM & MAG HYDROXIDE-SIMETH 200-200-20 MG/5ML PO SUSP: 30.0000 mL | ORAL | Status: DC | PRN

## 2021-03-08 MED ORDER — HYDROXYZINE HCL 25 MG PO TABS: 25.0000 mg | ORAL_TABLET | Freq: Three times a day (TID) | ORAL | Status: DC | PRN

## 2021-03-08 MED ORDER — ACETAMINOPHEN 325 MG PO TABS: 650.0000 mg | ORAL_TABLET | Freq: Four times a day (QID) | ORAL | Status: DC | PRN

## 2021-03-08 MED ORDER — MAGNESIUM HYDROXIDE 400 MG/5ML PO SUSP: 30.0000 mL | Freq: Every day | ORAL | Status: DC | PRN

## 2021-03-08 MED ORDER — LORAZEPAM 1 MG PO TABS
2.0000 mg | ORAL_TABLET | Freq: Once | ORAL | Status: AC
Start: 2021-03-08 — End: 2021-03-08
  Administered 2021-03-08: 2 mg via ORAL
  Filled 2021-03-08: qty 2

## 2021-03-08 MED ORDER — OLANZAPINE 15 MG PO TBDP
15.0000 mg | ORAL_TABLET | Freq: Once | ORAL | Status: AC
Start: 2021-03-08 — End: 2021-03-08
  Administered 2021-03-08: 15 mg via ORAL
  Filled 2021-03-08: qty 1

## 2021-03-08 NOTE — ED Provider Notes (Signed)
Behavioral Health Admission H&P [facility] & OBS)  Date: 03/08/21 Patient Name: [name] [name] MRN: [id number] Chief Complaint:  Chief Complaint  Patient presents with   Manic Behavior   Schizophrenia      Diagnoses:  Final diagnoses:  Schizoaffective disorder, bipolar type ([place])  Manic behavior ([place])    HPI: [name] [name] is a 41 year old female with a psychiatric history schizoaffective disorder, bipolar type.  Patient presented voluntarily to [facility]. Patient is accompanied by her significant other Mr. Samson Frederic. Patient gave verbal consent for Mr Berenice Primas to participate in her assessment. Patient presented requesting prescription for  Zyprexa.   Patient was seen face to face and her chart was reviewed by this provider.  On evaluation, patient is alert and oriented to self, place, and time.  Patient is noted with   manic symptoms. Patient's speech is pressured; she is grandiose, her thought process is  disorganized thought process, and she demonstrates flight of ideas. 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 Patient's mood is irritable.  Patient report that she was recently hospitalized from [facility]. Patient says the doctor at [facility] was unfamiliar with her and erroneously placed her on Depakote. Patient reports that she stopped taking Depakote upon discharging from [facility]. She reports that she would like to placed on Zyprexa.   Mr. Berenice Primas reports that the patient has been acting erractic for about a week. He reports that patient threw away her Depakote and has not been taking any of her prescribed medications. He also reports that patient has not slept in the past 5 days. He says patient stays up all night exercising and making noise. He reports that patient was recently evicted from her boarding house due to  erratic behavior.   PHQ 2-9:   Flowsheet Row ED from 02/17/2021 in [employer]  ED from 11/27/2020 in [facility] ED from 10/09/2020 in [place] DEPT  C-SSRS RISK CATEGORY No Risk No Risk Error: Q3, 4, or 5 should not be populated when Q2 is No        Total Time spent with patient: 30 minutes  Musculoskeletal  Strength & Muscle Tone: within normal limits Gait & Station: normal Patient leans: Right  Psychiatric Specialty Exam  Presentation General Appearance: Appropriate for Environment  Eye Contact:Good  Speech:Clear and Coherent  Speech Volume:Normal  Handedness:Right   Mood and Affect  Mood:Irritable  Affect:Congruent   Thought Process  Thought Processes:Disorganized  Descriptions of Associations:Tangential  Orientation:Full (Time, Place and Person)  Thought Content:Tangential  Diagnosis of Schizophrenia or Schizoaffective disorder in past: Yes  Duration of Psychotic Symptoms: Greater than six months  Hallucinations:Hallucinations: None  Ideas of Reference:None  Suicidal Thoughts:Suicidal Thoughts: No  Homicidal Thoughts:Homicidal Thoughts: No   Sensorium  Memory:Immediate Fair; Recent Fair; Remote Poor  Judgment:Impaired  Insight:Lacking   Executive Functions  Concentration:Poor  Attention Span:Poor  [place]  Language:Good   Psychomotor Activity  Psychomotor Activity:Psychomotor Activity: Restlessness   Assets  Assets:Communication Skills; Social Support; Physical Health   Sleep  Sleep:Sleep: Poor Number of Hours of Sleep: 0 (patient has not slept in 5 days)   Nutritional Assessment (For OBS and FBC admissions only) Has the patient had a weight loss or gain of 10 pounds or more in the last 3 months?: No Has the patient had a decrease in food intake/or appetite?: No Does the patient have dental problems?:  No Does the patient have eating habits or behaviors that may be indicators of an eating disorder including  binging or inducing vomiting?: No Has the patient recently lost weight without trying?: 0 Has the patient been eating poorly because of a decreased appetite?: 0 Malnutrition Screening Tool Score: 0    Physical Exam Vitals and nursing note reviewed.  Constitutional:      General: She is not in acute distress.    Appearance: She is well-developed.  HENT:     Head: Normocephalic and atraumatic.  Eyes:     Conjunctiva/sclera: Conjunctivae normal.  Cardiovascular:     Rate and Rhythm: Tachycardia present.  Pulmonary:     Effort: Pulmonary effort is normal. No respiratory distress.     Breath sounds: Normal breath sounds.  Abdominal:     Palpations: Abdomen is soft.     Tenderness: There is no abdominal tenderness.  Musculoskeletal:        General: No swelling.     Cervical back: Neck supple.  Skin:    General: Skin is warm and dry.     Capillary Refill: Capillary refill takes less than 2 seconds.  Neurological:     Mental Status: She is alert and oriented to person, place, and time.  Psychiatric:        Attention and Perception: Attention and perception normal.        Mood and Affect: Mood normal. Affect is angry.        Speech: Speech normal.        Behavior: Behavior is agitated.        Thought Content: Thought content normal.   Review of Systems  Constitutional: Negative.   HENT: Negative.    Eyes: Negative.   Respiratory: Negative.    Cardiovascular: Negative.   Gastrointestinal: Negative.   Genitourinary: Negative.   Musculoskeletal: Negative.   Skin: Negative.   Neurological: Negative.   Endo/Heme/Allergies: Negative.   Psychiatric/Behavioral:  Negative for hallucinations, substance abuse and suicidal ideas. The patient is nervous/anxious.    Blood pressure (!) 168/93, pulse (!) 113, temperature 98.2 F (36.8 C), temperature source Oral, resp. rate 16, SpO2 99 %, unknown if currently breastfeeding. There is no height or weight on file to calculate BMI.  Past  Psychiatric History: Schizoaffective disorder,  Is the patient at risk to self? Yes  Has the patient been a risk to self in the past 6 months? No .    Has the patient been a risk to self within the distant past? No   Is the patient a risk to others? No   Has the patient been a risk to others in the past 6 months? No   Has the patient been a risk to others within the distant past? No   Past Medical History:  Past Medical History:  Diagnosis Date   Bipolar affective disorder, currently manic, mild ([place])    Diabetes mellitus without complication ([place])    Schizophrenia ([place])     Past Surgical History:  Procedure Laterality Date   CESAREAN SECTION  04/2018   WISDOM TOOTH EXTRACTION      Family History:  Family History  Problem Relation Age of Onset   Drug abuse Maternal Uncle     Social History:  Social History   Socioeconomic History   Marital status: Legally Separated    Spouse name: Not on file   Number of children: Not on file   Years of education: Not on file   Highest education level:  Not on file  Occupational History   Not on file  Tobacco Use   Smoking status: Never   Smokeless tobacco: Never  Vaping Use   Vaping Use: Never used  Substance and Sexual Activity   Alcohol use: Not Currently   Drug use: Not Currently   Sexual activity: Not Currently  Other Topics Concern   Not on file  Social History Narrative   ** Merged History Encounter **       ** Merged History Encounter **       Social Determinants of Health   Financial Resource Strain: Not on file  Food Insecurity: Not on file  Transportation Needs: Not on file  Physical Activity: Not on file  Stress: Not on file  Social Connections: Not on file  Intimate Partner Violence: Not on file    SDOH:  SDOH Screenings   Alcohol Screen: Not on file  Depression (PHQ2-9): Not on file  Financial Resource Strain: Not on file  Food Insecurity: Not on file  Housing: Not on file  Physical Activity: Not  on file  Social Connections: Not on file  Stress: Not on file  Tobacco Use: Low Risk    Smoking Tobacco Use: Never   Smokeless Tobacco Use: Never   Passive Exposure: Not on file  Transportation Needs: Not on file    Last Labs:  Admission on 02/17/2021, Discharged on 02/20/2021  Component Date Value Ref Range Status   Sodium 02/17/2021 139  135 - 145 mmol/L Final   Potassium 02/17/2021 3.4 (L)  3.5 - 5.1 mmol/L Final   Chloride 02/17/2021 106  98 - 111 mmol/L Final   CO2 02/17/2021 18 (L)  22 - 32 mmol/L Final   Glucose, Bld 02/17/2021 133 (H)  70 - 99 mg/dL Final   Glucose reference range applies only to samples taken after fasting for at least 8 hours.   BUN 02/17/2021 19  6 - 20 mg/dL Final   Creatinine, Ser 02/17/2021 1.06 (H)  0.44 - 1.00 mg/dL Final   Calcium 02/17/2021 9.4  8.9 - 10.3 mg/dL Final   Total Protein 02/17/2021 8.4 (H)  6.5 - 8.1 g/dL Final   Albumin 02/17/2021 4.5  3.5 - 5.0 g/dL Final   AST 02/17/2021 33  15 - 41 U/L Final   ALT 02/17/2021 22  0 - 44 U/L Final   Alkaline Phosphatase 02/17/2021 44  38 - 126 U/L Final   Total Bilirubin 02/17/2021 1.0  0.3 - 1.2 mg/dL Final   GFR, Estimated 02/17/2021 >60  >60 mL/min Final   Comment: (NOTE) Calculated using the CKD-EPI Creatinine Equation (2021)    Anion gap 02/17/2021 15  5 - 15 Final   Performed at [facility] [address] [place], [place] [address]   WBC 02/17/2021 7.3  4.0 - 10.5 K/uL Final   RBC 02/17/2021 3.95  3.87 - 5.11 MIL/uL Final   Hemoglobin 02/17/2021 12.7  12.0 - 15.0 g/dL Final   HCT 02/17/2021 37.0  36.0 - 46.0 % Final   MCV 02/17/2021 93.7  80.0 - 100.0 fL Final   MCH 02/17/2021 32.2  26.0 - 34.0 pg Final   MCHC 02/17/2021 34.3  30.0 - 36.0 g/dL Final   RDW 02/17/2021 13.2  11.5 - 15.5 % Final   Platelets 02/17/2021 227  150 - 400 K/uL Final   nRBC 02/17/2021 0.0  0.0 - 0.2 % Final   Neutrophils Relative % 02/17/2021 70  % Final   Neutro Abs  02/17/2021 5.1  1.7 - 7.7 K/uL  Final   Lymphocytes Relative 02/17/2021 22  % Final   Lymphs Abs 02/17/2021 1.6  0.7 - 4.0 K/uL Final   Monocytes Relative 02/17/2021 8  % Final   Monocytes Absolute 02/17/2021 0.6  0.1 - 1.0 K/uL Final   Eosinophils Relative 02/17/2021 0  % Final   Eosinophils Absolute 02/17/2021 0.0  0.0 - 0.5 K/uL Final   Basophils Relative 02/17/2021 0  % Final   Basophils Absolute 02/17/2021 0.0  0.0 - 0.1 K/uL Final   Immature Granulocytes 02/17/2021 0  % Final   Abs Immature Granulocytes 02/17/2021 0.02  0.00 - 0.07 K/uL Final   Performed at [facility] [address], [place], [place] [address]   I-stat hCG, quantitative 02/17/2021 <5.0  <5 mIU/mL Final   Comment 3 02/17/2021          Final   Comment:   GEST. AGE      CONC.  (mIU/mL)   <=1 WEEK        5 - 50     2 WEEKS       50 - 500     3 WEEKS       100 - 10,000     4 WEEKS     1,000 - 30,000        FEMALE AND NON-PREGNANT FEMALE:     LESS THAN 5 mIU/mL    SARS Coronavirus 2 by RT PCR 02/17/2021 NEGATIVE  NEGATIVE Final   Comment: (NOTE) SARS-CoV-2 target nucleic acids are NOT DETECTED.  The SARS-CoV-2 RNA is generally detectable in upper respiratory specimens during the acute phase of infection. The lowest concentration of SARS-CoV-2 viral copies this assay can detect is 138 copies/mL. A negative result does not preclude SARS-Cov-2 infection and should not be used as the sole basis for treatment or other patient management decisions. A negative result may occur with  improper specimen collection/handling, submission of specimen other than nasopharyngeal swab, presence of viral mutation(s) within the areas targeted by this assay, and inadequate number of viral copies(<138 copies/mL). A negative result must be combined with clinical observations, patient history, and epidemiological information. The expected result is Negative.  Fact Sheet for Patients:  EntrepreneurPulse.com.au  Fact Sheet for Healthcare  Providers:  IncredibleEmployment.be  This test is no                          t yet approved or cleared by the [place] FDA and  has been authorized for detection and/or diagnosis of SARS-CoV-2 by FDA under an Emergency Use Authorization (EUA). This EUA will remain  in effect (meaning this test can be used) for the duration of the COVID-19 declaration under Section 564(b)(1) of the Act, 21 U.S.C.section 360bbb-3(b)(1), unless the authorization is terminated  or revoked sooner.       Influenza A by PCR 02/17/2021 NEGATIVE  NEGATIVE Final   Influenza B by PCR 02/17/2021 NEGATIVE  NEGATIVE Final   Comment: (NOTE) The Xpert Xpress SARS-CoV-2/FLU/RSV plus assay is intended as an aid in the diagnosis of influenza from Nasopharyngeal swab specimens and should not be used as a sole basis for treatment. Nasal washings and aspirates are unacceptable for Xpert Xpress SARS-CoV-2/FLU/RSV testing.  Fact Sheet for Patients: EntrepreneurPulse.com.au  Fact Sheet for Healthcare Providers: IncredibleEmployment.be  This test is not yet approved or cleared by the [place] FDA and has been authorized for detection and/or  diagnosis of SARS-CoV-2 by FDA under an Emergency Use Authorization (EUA). This EUA will remain in effect (meaning this test can be used) for the duration of the COVID-19 declaration under Section 564(b)(1) of the Act, 21 U.S.C. section 360bbb-3(b)(1), unless the authorization is terminated or revoked.  Performed at [facility], [place] [address], [place], [place] [address]    Valproic Acid Lvl 02/18/2021 <10 (L)  50.0 - 100.0 ug/mL Final   Comment: RESULTS CONFIRMED BY MANUAL DILUTION Performed at [facility], [place] [address] [place], [place] [address]    Glucose-Capillary 02/19/2021 97  70 - 99 mg/dL Final   Glucose reference range applies only to samples taken after fasting for at least 8 hours.   Admission on 11/27/2020, Discharged on 11/29/2020  Component Date Value Ref Range Status   WBC 11/27/2020 5.9  4.0 - 10.5 K/uL Final   RBC 11/27/2020 3.86 (L)  3.87 - 5.11 MIL/uL Final   Hemoglobin 11/27/2020 12.1  12.0 - 15.0 g/dL Final   HCT 11/27/2020 36.3  36.0 - 46.0 % Final   MCV 11/27/2020 94.0  80.0 - 100.0 fL Final   MCH 11/27/2020 31.3  26.0 - 34.0 pg Final   MCHC 11/27/2020 33.3  30.0 - 36.0 g/dL Final   RDW 11/27/2020 14.2  11.5 - 15.5 % Final   Platelets 11/27/2020 146 (L)  150 - 400 K/uL Final   nRBC 11/27/2020 0.0  0.0 - 0.2 % Final   Performed at [facility], [place] [address], [place], [place] [address]   Sodium 11/27/2020 140  135 - 145 mmol/L Final   Potassium 11/27/2020 3.2 (L)  3.5 - 5.1 mmol/L Final   Chloride 11/27/2020 103  98 - 111 mmol/L Final   CO2 11/27/2020 25  22 - 32 mmol/L Final   Glucose, Bld 11/27/2020 99  70 - 99 mg/dL Final   Glucose reference range applies only to samples taken after fasting for at least 8 hours.   BUN 11/27/2020 12  6 - 20 mg/dL Final   Creatinine, Ser 11/27/2020 0.92  0.44 - 1.00 mg/dL Final   Calcium 11/27/2020 9.2  8.9 - 10.3 mg/dL Final   Total Protein 11/27/2020 8.1  6.5 - 8.1 g/dL Final   Albumin 11/27/2020 4.0  3.5 - 5.0 g/dL Final   AST 11/27/2020 30  15 - 41 U/L Final   ALT 11/27/2020 22  0 - 44 U/L Final   Alkaline Phosphatase 11/27/2020 48  38 - 126 U/L Final   Total Bilirubin 11/27/2020 1.0  0.3 - 1.2 mg/dL Final   GFR, Estimated 11/27/2020 >60  >60 mL/min Final   Comment: (NOTE) Calculated using the CKD-EPI Creatinine Equation (2021)    Anion gap 11/27/2020 12  5 - 15 Final   Performed at [facility] [address], [place], [place] [address]   Alcohol, Ethyl (B) 11/27/2020 <10  <10 mg/dL Final   Comment: (NOTE) Lowest detectable limit for serum alcohol is 10 mg/dL.  For medical purposes only. Performed at [facility], [place] [address], [place], [place] [address]    Opiates 11/27/2020 NONE  DETECTED  NONE DETECTED Final   Cocaine 11/27/2020 NONE DETECTED  NONE DETECTED Final   Benzodiazepines 11/27/2020 POSITIVE (A)  NONE DETECTED Final   Amphetamines 11/27/2020 NONE DETECTED  NONE DETECTED Final   Tetrahydrocannabinol 11/27/2020 NONE DETECTED  NONE DETECTED Final   Barbiturates 11/27/2020 NONE DETECTED  NONE DETECTED Final   Comment: (  NOTE) DRUG SCREEN FOR MEDICAL PURPOSES ONLY.  IF CONFIRMATION IS NEEDED FOR ANY PURPOSE, NOTIFY LAB WITHIN 5 DAYS.  LOWEST DETECTABLE LIMITS FOR URINE DRUG SCREEN Drug Class                     Cutoff (ng/mL) Amphetamine and metabolites    1000 Barbiturate and metabolites    200 Benzodiazepine                 595 Tricyclics and metabolites     300 Opiates and metabolites        300 Cocaine and metabolites        300 THC                            50 Performed at [facility], [place] [address], [place], [place] [address]    Valproic Acid Lvl 11/27/2020 12 (L)  50.0 - 100.0 ug/mL Final   Performed at [facility] [address] [place], [place] [address]   SARS Coronavirus 2 by RT PCR 11/27/2020 NEGATIVE  NEGATIVE Final   Comment: (NOTE) SARS-CoV-2 target nucleic acids are NOT DETECTED.  The SARS-CoV-2 RNA is generally detectable in upper respiratory specimens during the acute phase of infection. The lowest concentration of SARS-CoV-2 viral copies this assay can detect is 138 copies/mL. A negative result does not preclude SARS-Cov-2 infection and should not be used as the sole basis for treatment or other patient management decisions. A negative result may occur with  improper specimen collection/handling, submission of specimen other than nasopharyngeal swab, presence of viral mutation(s) within the areas targeted by this assay, and inadequate number of viral copies(<138 copies/mL). A negative result must be combined with clinical observations, patient history, and epidemiological information. The expected result is  Negative.  Fact Sheet for Patients:  EntrepreneurPulse.com.au  Fact Sheet for Healthcare Providers:  IncredibleEmployment.be  This test is no                          t yet approved or cleared by the [place] FDA and  has been authorized for detection and/or diagnosis of SARS-CoV-2 by FDA under an Emergency Use Authorization (EUA). This EUA will remain  in effect (meaning this test can be used) for the duration of the COVID-19 declaration under Section 564(b)(1) of the Act, 21 U.S.C.section 360bbb-3(b)(1), unless the authorization is terminated  or revoked sooner.       Influenza A by PCR 11/27/2020 NEGATIVE  NEGATIVE Final   Influenza B by PCR 11/27/2020 NEGATIVE  NEGATIVE Final   Comment: (NOTE) The Xpert Xpress SARS-CoV-2/FLU/RSV plus assay is intended as an aid in the diagnosis of influenza from Nasopharyngeal swab specimens and should not be used as a sole basis for treatment. Nasal washings and aspirates are unacceptable for Xpert Xpress SARS-CoV-2/FLU/RSV testing.  Fact Sheet for Patients: EntrepreneurPulse.com.au  Fact Sheet for Healthcare Providers: IncredibleEmployment.be  This test is not yet approved or cleared by the [place] FDA and has been authorized for detection and/or diagnosis of SARS-CoV-2 by FDA under an Emergency Use Authorization (EUA). This EUA will remain in effect (meaning this test can be used) for the duration of the COVID-19 declaration under Section 564(b)(1) of the Act, 21 U.S.C. section 360bbb-3(b)(1), unless the authorization is terminated or revoked.  Performed at [facility], [place] [address], [place], [place] [address]  I-stat hCG, quantitative 11/27/2020 <5.0  <5 mIU/mL Final   Comment 3 11/27/2020          Final   Comment:   GEST. AGE      CONC.  (mIU/mL)   <=1 WEEK        5 - 50     2 WEEKS       50 - 500     3 WEEKS       100 - 10,000     4  WEEKS     1,000 - 30,000        FEMALE AND NON-PREGNANT FEMALE:     LESS THAN 5 mIU/mL   Admission on 10/09/2020, Discharged on 10/19/2020  Component Date Value Ref Range Status   SARS Coronavirus 2 by RT PCR 10/09/2020 NEGATIVE  NEGATIVE Final   Comment: (NOTE) SARS-CoV-2 target nucleic acids are NOT DETECTED.  The SARS-CoV-2 RNA is generally detectable in upper respiratory specimens during the acute phase of infection. The lowest concentration of SARS-CoV-2 viral copies this assay can detect is 138 copies/mL. A negative result does not preclude SARS-Cov-2 infection and should not be used as the sole basis for treatment or other patient management decisions. A negative result may occur with  improper specimen collection/handling, submission of specimen other than nasopharyngeal swab, presence of viral mutation(s) within the areas targeted by this assay, and inadequate number of viral copies(<138 copies/mL). A negative result must be combined with clinical observations, patient history, and epidemiological information. The expected result is Negative.  Fact Sheet for Patients:  EntrepreneurPulse.com.au  Fact Sheet for Healthcare Providers:  IncredibleEmployment.be  This test is no                          t yet approved or cleared by the [place] FDA and  has been authorized for detection and/or diagnosis of SARS-CoV-2 by FDA under an Emergency Use Authorization (EUA). This EUA will remain  in effect (meaning this test can be used) for the duration of the COVID-19 declaration under Section 564(b)(1) of the Act, 21 U.S.C.section 360bbb-3(b)(1), unless the authorization is terminated  or revoked sooner.       Influenza A by PCR 10/09/2020 NEGATIVE  NEGATIVE Final   Influenza B by PCR 10/09/2020 NEGATIVE  NEGATIVE Final   Comment: (NOTE) The Xpert Xpress SARS-CoV-2/FLU/RSV plus assay is intended as an aid in the diagnosis of influenza  from Nasopharyngeal swab specimens and should not be used as a sole basis for treatment. Nasal washings and aspirates are unacceptable for Xpert Xpress SARS-CoV-2/FLU/RSV testing.  Fact Sheet for Patients: EntrepreneurPulse.com.au  Fact Sheet for Healthcare Providers: IncredibleEmployment.be  This test is not yet approved or cleared by the [place] FDA and has been authorized for detection and/or diagnosis of SARS-CoV-2 by FDA under an Emergency Use Authorization (EUA). This EUA will remain in effect (meaning this test can be used) for the duration of the COVID-19 declaration under Section 564(b)(1) of the Act, 21 U.S.C. section 360bbb-3(b)(1), unless the authorization is terminated or revoked.  Performed at [facility], [place] [address], [place], [place] [address]    Sodium 10/09/2020 146 (H)  135 - 145 mmol/L Final   Potassium 10/09/2020 4.0  3.5 - 5.1 mmol/L Final   Chloride 10/09/2020 107  98 - 111 mmol/L Final   CO2 10/09/2020 23  22 - 32 mmol/L Final   Glucose, Bld 10/09/2020 99  70 - 99  mg/dL Final   Glucose reference range applies only to samples taken after fasting for at least 8 hours.   BUN 10/09/2020 18  6 - 20 mg/dL Final   Creatinine, Ser 10/09/2020 0.98  0.44 - 1.00 mg/dL Final   Calcium 10/09/2020 9.6  8.9 - 10.3 mg/dL Final   Total Protein 10/09/2020 8.5 (H)  6.5 - 8.1 g/dL Final   Albumin 10/09/2020 4.4  3.5 - 5.0 g/dL Final   AST 10/09/2020 55 (H)  15 - 41 U/L Final   ALT 10/09/2020 27  0 - 44 U/L Final   Alkaline Phosphatase 10/09/2020 50  38 - 126 U/L Final   Total Bilirubin 10/09/2020 0.9  0.3 - 1.2 mg/dL Final   GFR, Estimated 10/09/2020 >60  >60 mL/min Final   Comment: (NOTE) Calculated using the CKD-EPI Creatinine Equation (2021)    Anion gap 10/09/2020 16 (H)  5 - 15 Final   Performed at [facility], [place] [address], [place], [place] [address]   Alcohol, Ethyl (B)  10/09/2020 <10  <10 mg/dL Final   Comment: (NOTE) Lowest detectable limit for serum alcohol is 10 mg/dL.  For medical purposes only. Performed at [facility], [place] [address] [place], [place] [address]    WBC 10/09/2020 11.0 (H)  4.0 - 10.5 K/uL Final   RBC 10/09/2020 3.80 (L)  3.87 - 5.11 MIL/uL Final   Hemoglobin 10/09/2020 12.0  12.0 - 15.0 g/dL Final   HCT 10/09/2020 36.3  36.0 - 46.0 % Final   MCV 10/09/2020 95.5  80.0 - 100.0 fL Final   MCH 10/09/2020 31.6  26.0 - 34.0 pg Final   MCHC 10/09/2020 33.1  30.0 - 36.0 g/dL Final   RDW 10/09/2020 13.1  11.5 - 15.5 % Final   Platelets 10/09/2020 264  150 - 400 K/uL Final   nRBC 10/09/2020 0.0  0.0 - 0.2 % Final   Neutrophils Relative % 10/09/2020 72  % Final   Neutro Abs 10/09/2020 8.0 (H)  1.7 - 7.7 K/uL Final   Lymphocytes Relative 10/09/2020 16  % Final   Lymphs Abs 10/09/2020 1.8  0.7 - 4.0 K/uL Final   Monocytes Relative 10/09/2020 11  % Final   Monocytes Absolute 10/09/2020 1.2 (H)  0.1 - 1.0 K/uL Final   Eosinophils Relative 10/09/2020 0  % Final   Eosinophils Absolute 10/09/2020 0.0  0.0 - 0.5 K/uL Final   Basophils Relative 10/09/2020 0  % Final   Basophils Absolute 10/09/2020 0.0  0.0 - 0.1 K/uL Final   Immature Granulocytes 10/09/2020 1  % Final   Abs Immature Granulocytes 10/09/2020 0.05  0.00 - 0.07 K/uL Final   Performed at [facility], [place] [address], [place], [place] [address]   hCG, Ollen Barges, Quant, S 10/09/2020 <1  <5 mIU/mL Final   Comment:          GEST. AGE      CONC.  (mIU/mL)   <=1 WEEK        5 - 50     2 WEEKS       50 - 500     3 WEEKS       100 - 10,000     4 WEEKS     1,000 - 30,000     5 WEEKS     3,500 - 115,000   6-8 WEEKS     12,000 - 270,000    12 WEEKS     15,000 -  220,000        FEMALE AND NON-PREGNANT FEMALE:     LESS THAN 5 mIU/mL Performed at [facility], [place] [address] [place], [place] [address]    Preg Test, Ur 10/11/2020  NEGATIVE  NEGATIVE Final   Comment:        THE SENSITIVITY OF THIS METHODOLOGY IS >20 mIU/mL. Performed at [facility], [place] [address], [place], [place] [address]    Opiates 10/11/2020 NONE DETECTED  NONE DETECTED Final   Cocaine 10/11/2020 NONE DETECTED  NONE DETECTED Final   Benzodiazepines 10/11/2020 NONE DETECTED  NONE DETECTED Final   Amphetamines 10/11/2020 NONE DETECTED  NONE DETECTED Final   Tetrahydrocannabinol 10/11/2020 NONE DETECTED  NONE DETECTED Final   Barbiturates 10/11/2020 NONE DETECTED  NONE DETECTED Final   Comment: (NOTE) DRUG SCREEN FOR MEDICAL PURPOSES ONLY.  IF CONFIRMATION IS NEEDED FOR ANY PURPOSE, NOTIFY LAB WITHIN 5 DAYS.  LOWEST DETECTABLE LIMITS FOR URINE DRUG SCREEN Drug Class                     Cutoff (ng/mL) Amphetamine and metabolites    1000 Barbiturate and metabolites    200 Benzodiazepine                 099 Tricyclics and metabolites     300 Opiates and metabolites        300 Cocaine and metabolites        300 THC                            50 Performed at [facility], [place] [address], [place], [place] [address]    Color, Urine 10/11/2020 YELLOW  YELLOW Final   APPearance 10/11/2020 HAZY (A)  CLEAR Final   Specific Gravity, Urine 10/11/2020 1.006  1.005 - 1.030 Final   pH 10/11/2020 6.0  5.0 - 8.0 Final   Glucose, UA 10/11/2020 NEGATIVE  NEGATIVE mg/dL Final   Hgb urine dipstick 10/11/2020 NEGATIVE  NEGATIVE Final   Bilirubin Urine 10/11/2020 NEGATIVE  NEGATIVE Final   Ketones, ur 10/11/2020 NEGATIVE  NEGATIVE mg/dL Final   Protein, ur 10/11/2020 NEGATIVE  NEGATIVE mg/dL Final   Nitrite 10/11/2020 NEGATIVE  NEGATIVE Final   Leukocytes,Ua 10/11/2020 SMALL (A)  NEGATIVE Final   RBC / HPF 10/11/2020 0-5  0 - 5 RBC/hpf Final   WBC, UA 10/11/2020 0-5  0 - 5 WBC/hpf Final   Bacteria, UA 10/11/2020 RARE (A)  NONE SEEN Final   Squamous Epithelial / LPF 10/11/2020 0-5  0 - 5 Final   Performed at [facility], [place] [address] [place], [place] [address]   Valproic Acid Lvl 10/17/2020 65  50.0 - 100.0 ug/mL Final   Performed at [facility], [place] [address] [place], [place] [address]   SARS Coronavirus 2 by RT PCR 10/18/2020 NEGATIVE  NEGATIVE Final   Comment: (NOTE) SARS-CoV-2 target nucleic acids are NOT DETECTED.  The SARS-CoV-2 RNA is generally detectable in upper respiratory specimens during the acute phase of infection. The lowest concentration of SARS-CoV-2 viral copies this assay can detect is 138 copies/mL. A negative result does not preclude SARS-Cov-2 infection and should not be used as the sole basis for treatment or other patient management decisions. A negative result may occur with  improper specimen collection/handling, submission of specimen other than nasopharyngeal swab, presence of viral mutation(s) within the areas targeted  by this assay, and inadequate number of viral copies(<138 copies/mL). A negative result must be combined with clinical observations, patient history, and epidemiological information. The expected result is Negative.  Fact Sheet for Patients:  EntrepreneurPulse.com.au  Fact Sheet for Healthcare Providers:  IncredibleEmployment.be  This test is no                          t yet approved or cleared by the [place] FDA and  has been authorized for detection and/or diagnosis of SARS-CoV-2 by FDA under an Emergency Use Authorization (EUA). This EUA will remain  in effect (meaning this test can be used) for the duration of the COVID-19 declaration under Section 564(b)(1) of the Act, 21 U.S.C.section 360bbb-3(b)(1), unless the authorization is terminated  or revoked sooner.       Influenza A by PCR 10/18/2020 NEGATIVE  NEGATIVE Final   Influenza B by PCR 10/18/2020 NEGATIVE  NEGATIVE Final   Comment: (NOTE) The Xpert Xpress SARS-CoV-2/FLU/RSV plus assay is intended as  an aid in the diagnosis of influenza from Nasopharyngeal swab specimens and should not be used as a sole basis for treatment. Nasal washings and aspirates are unacceptable for Xpert Xpress SARS-CoV-2/FLU/RSV testing.  Fact Sheet for Patients: EntrepreneurPulse.com.au  Fact Sheet for Healthcare Providers: IncredibleEmployment.be  This test is not yet approved or cleared by the [place] FDA and has been authorized for detection and/or diagnosis of SARS-CoV-2 by FDA under an Emergency Use Authorization (EUA). This EUA will remain in effect (meaning this test can be used) for the duration of the COVID-19 declaration under Section 564(b)(1) of the Act, 21 U.S.C. section 360bbb-3(b)(1), unless the authorization is terminated or revoked.  Performed at [facility], [place] [address], [place], [place] [address]   Admission on 09/20/2020, Discharged on 09/27/2020  Component Date Value Ref Range Status   Sodium 09/20/2020 138  135 - 145 mmol/L Final   Potassium 09/20/2020 3.6  3.5 - 5.1 mmol/L Final   Chloride 09/20/2020 105  98 - 111 mmol/L Final   CO2 09/20/2020 21 (L)  22 - 32 mmol/L Final   Glucose, Bld 09/20/2020 96  70 - 99 mg/dL Final   Glucose reference range applies only to samples taken after fasting for at least 8 hours.   BUN 09/20/2020 16  6 - 20 mg/dL Final   Creatinine, Ser 09/20/2020 1.04 (H)  0.44 - 1.00 mg/dL Final   Calcium 09/20/2020 9.1  8.9 - 10.3 mg/dL Final   Total Protein 09/20/2020 8.4 (H)  6.5 - 8.1 g/dL Final   Albumin 09/20/2020 4.4  3.5 - 5.0 g/dL Final   AST 09/20/2020 22  15 - 41 U/L Final   ALT 09/20/2020 15  0 - 44 U/L Final   Alkaline Phosphatase 09/20/2020 43  38 - 126 U/L Final   Total Bilirubin 09/20/2020 1.2  0.3 - 1.2 mg/dL Final   GFR, Estimated 09/20/2020 >60  >60 mL/min Final   Comment: (NOTE) Calculated using the CKD-EPI Creatinine Equation (2021)    Anion gap 09/20/2020 12  5 - 15  Final   Performed at [place] [address] [place], [place] [address]   WBC 09/20/2020 9.8  4.0 - 10.5 K/uL Final   RBC 09/20/2020 3.89  3.87 - 5.11 MIL/uL Final   Hemoglobin 09/20/2020 12.4  12.0 - 15.0 g/dL Final   HCT 09/20/2020 36.5  36.0 - 46.0 % Final   MCV 09/20/2020  93.8  80.0 - 100.0 fL Final   MCH 09/20/2020 31.9  26.0 - 34.0 pg Final   MCHC 09/20/2020 34.0  30.0 - 36.0 g/dL Final   RDW 09/20/2020 13.5  11.5 - 15.5 % Final   Platelets 09/20/2020 195  150 - 400 K/uL Final   nRBC 09/20/2020 0.0  0.0 - 0.2 % Final   Neutrophils Relative % 09/20/2020 83  % Final   Neutro Abs 09/20/2020 8.0 (H)  1.7 - 7.7 K/uL Final   Lymphocytes Relative 09/20/2020 9  % Final   Lymphs Abs 09/20/2020 0.9  0.7 - 4.0 K/uL Final   Monocytes Relative 09/20/2020 8  % Final   Monocytes Absolute 09/20/2020 0.8  0.1 - 1.0 K/uL Final   Eosinophils Relative 09/20/2020 0  % Final   Eosinophils Absolute 09/20/2020 0.0  0.0 - 0.5 K/uL Final   Basophils Relative 09/20/2020 0  % Final   Basophils Absolute 09/20/2020 0.0  0.0 - 0.1 K/uL Final   Immature Granulocytes 09/20/2020 0  % Final   Abs Immature Granulocytes 09/20/2020 0.03  0.00 - 0.07 K/uL Final   Performed at [facility], [place] [address], [place], [place] [address]   Color, Urine 09/21/2020 YELLOW  YELLOW Final   APPearance 09/21/2020 HAZY (A)  CLEAR Final   Specific Gravity, Urine 09/21/2020 1.027  1.005 - 1.030 Final   pH 09/21/2020 5.0  5.0 - 8.0 Final   Glucose, UA 09/21/2020 NEGATIVE  NEGATIVE mg/dL Final   Hgb urine dipstick 09/21/2020 NEGATIVE  NEGATIVE Final   Bilirubin Urine 09/21/2020 NEGATIVE  NEGATIVE Final   Ketones, ur 09/21/2020 80 (A)  NEGATIVE mg/dL Final   Protein, ur 09/21/2020 30 (A)  NEGATIVE mg/dL Final   Nitrite 09/21/2020 NEGATIVE  NEGATIVE Final   Leukocytes,Ua 09/21/2020 MODERATE (A)  NEGATIVE Final   RBC / HPF 09/21/2020 0-5  0 - 5 RBC/hpf Final   WBC, UA 09/21/2020 21-50  0 - 5 WBC/hpf Final    Bacteria, UA 09/21/2020 RARE (A)  NONE SEEN Final   Squamous Epithelial / LPF 09/21/2020 0-5  0 - 5 Final   Mucus 09/21/2020 PRESENT   Final   Performed at [facility], [place] [address], [place], [place] [address]   Opiates 09/21/2020 NONE DETECTED  NONE DETECTED Final   Cocaine 09/21/2020 NONE DETECTED  NONE DETECTED Final   Benzodiazepines 09/21/2020 NONE DETECTED  NONE DETECTED Final   Amphetamines 09/21/2020 NONE DETECTED  NONE DETECTED Final   Tetrahydrocannabinol 09/21/2020 NONE DETECTED  NONE DETECTED Final   Barbiturates 09/21/2020 NONE DETECTED  NONE DETECTED Final   Comment: (NOTE) DRUG SCREEN FOR MEDICAL PURPOSES ONLY.  IF CONFIRMATION IS NEEDED FOR ANY PURPOSE, NOTIFY LAB WITHIN 5 DAYS.  LOWEST DETECTABLE LIMITS FOR URINE DRUG SCREEN Drug Class                     Cutoff (ng/mL) Amphetamine and metabolites    1000 Barbiturate and metabolites    200 Benzodiazepine                 580 Tricyclics and metabolites     300 Opiates and metabolites        300 Cocaine and metabolites        300 THC                            50 Performed at [facility], [address] [place], [place] [address]    Alcohol, Ethyl (B) 09/20/2020 <10  <10 mg/dL Final   Comment: (NOTE) Lowest detectable limit for serum alcohol is 10 mg/dL.  For medical purposes only. Performed at [facility], [place] [address], [place], [place] [address]    Salicylate Lvl 71/24/5809 <7.0 (L)  7.0 - 30.0 mg/dL Final   Performed at [place] [address], [place], [place] [address]   Acetaminophen (Tylenol), Serum 09/20/2020 <10 (L)  10 - 30 ug/mL Final   Comment: (NOTE) Therapeutic concentrations vary significantly. A range of 10-30 ug/mL  may be an effective concentration for many patients. However, some  are best treated at concentrations outside of this range. Acetaminophen concentrations >150 ug/mL at 4 hours after ingestion  and >50 ug/mL at 12 hours after ingestion are  often associated with  toxic reactions.  Performed at [facility], [place] [address] [place], [place] [address]    SARS Coronavirus 2 by RT PCR 09/20/2020 NEGATIVE  NEGATIVE Final   Comment: (NOTE) SARS-CoV-2 target nucleic acids are NOT DETECTED.  The SARS-CoV-2 RNA is generally detectable in upper respiratory specimens during the acute phase of infection. The lowest concentration of SARS-CoV-2 viral copies this assay can detect is 138 copies/mL. A negative result does not preclude SARS-Cov-2 infection and should not be used as the sole basis for treatment or other patient management decisions. A negative result may occur with  improper specimen collection/handling, submission of specimen other than nasopharyngeal swab, presence of viral mutation(s) within the areas targeted by this assay, and inadequate number of viral copies(<138 copies/mL). A negative result must be combined with clinical observations, patient history, and epidemiological information. The expected result is Negative.  Fact Sheet for Patients:  EntrepreneurPulse.com.au  Fact Sheet for Healthcare Providers:  IncredibleEmployment.be  This test is no                          t yet approved or cleared by the [place] FDA and  has been authorized for detection and/or diagnosis of SARS-CoV-2 by FDA under an Emergency Use Authorization (EUA). This EUA will remain  in effect (meaning this test can be used) for the duration of the COVID-19 declaration under Section 564(b)(1) of the Act, 21 U.S.C.section 360bbb-3(b)(1), unless the authorization is terminated  or revoked sooner.       Influenza A by PCR 09/20/2020 NEGATIVE  NEGATIVE Final   Influenza B by PCR 09/20/2020 NEGATIVE  NEGATIVE Final   Comment: (NOTE) The Xpert Xpress SARS-CoV-2/FLU/RSV plus assay is intended as an aid in the diagnosis of influenza from Nasopharyngeal swab specimens and should not be used as  a sole basis for treatment. Nasal washings and aspirates are unacceptable for Xpert Xpress SARS-CoV-2/FLU/RSV testing.  Fact Sheet for Patients: EntrepreneurPulse.com.au  Fact Sheet for Healthcare Providers: IncredibleEmployment.be  This test is not yet approved or cleared by the [place] FDA and has been authorized for detection and/or diagnosis of SARS-CoV-2 by FDA under an Emergency Use Authorization (EUA). This EUA will remain in effect (meaning this test can be used) for the duration of the COVID-19 declaration under Section 564(b)(1) of the Act, 21 U.S.C. section 360bbb-3(b)(1), unless the authorization is terminated or revoked.  Performed at [facility], [place] [address] [place], [place] [address]    I-stat hCG, quantitative 09/20/2020 <5.0  <5 mIU/mL Final   Comment 3 09/20/2020  Final   Comment:   GEST. AGE      CONC.  (mIU/mL)   <=1 WEEK        5 - 50     2 WEEKS       50 - 500     3 WEEKS       100 - 10,000     4 WEEKS     1,000 - 30,000        FEMALE AND NON-PREGNANT FEMALE:     LESS THAN 5 mIU/mL    Valproic Acid Lvl 09/24/2020 <10 (L)  50.0 - 100.0 ug/mL Final   Comment: RESULTS CONFIRMED BY MANUAL DILUTION Performed at [facility], [place] [address] [place], [place] [address]    Glucose-Capillary 09/25/2020 122 (H)  70 - 99 mg/dL Final   Glucose reference range applies only to samples taken after fasting for at least 8 hours.   Comment 1 09/25/2020 Notify RN   Final   Glucose-Capillary 09/27/2020 111 (H)  70 - 99 mg/dL Final   Glucose reference range applies only to samples taken after fasting for at least 8 hours.   Comment 1 09/27/2020 Document in Chart   Final    Allergies: Quetiapine, Gabapentin, Lorazepam, Risperidone, Trazodone and nefazodone, Valproic acid, Aripiprazole, Fluphenazine, Haloperidol, Trazodone, and Ziprasidone hcl  PTA Medications: (Not in a hospital admission)   Medical  Decision Making  Patient currently manic due to medication  noncompliance; Patient will be admitted to [facility] for continuous assessment and restarted on her medications. -labs ordered -Restart Depakote pending Valporic Acid level 3 to -Zyprexa 15mg  and Ativa 2mg  once for agitation -Zyprexa 5mg  BID -EKG     Recommendations  Based on my evaluation the patient does not appear to have an emergency medical condition.   Ophelia Shoulder, NP 03/08/21  11:07 PM

## 2021-03-08 NOTE — ED Triage Notes (Signed)
Pt presents to [facility] accompanied by her friend. Pt came to this facility earlier but refused to be put into the system. Pt is agitated, speaking loudly, speaking erratically, tangential speak, unable to track. Pt refused vitals " Do not touch me, I don't want to be touched". Pt states ." I do not want to be seen here, where is my doctor her name is CC".Unable to complete triage process due to manic behavior. ?

## 2021-03-09 ENCOUNTER — Other Ambulatory Visit: Payer: Self-pay

## 2021-03-09 DIAGNOSIS — F3010 Manic episode without psychotic symptoms, unspecified: Secondary | ICD-10-CM

## 2021-03-09 DIAGNOSIS — F29 Unspecified psychosis not due to a substance or known physiological condition: Secondary | ICD-10-CM | POA: Diagnosis not present

## 2021-03-09 DIAGNOSIS — F250 Schizoaffective disorder, bipolar type: Secondary | ICD-10-CM

## 2021-03-09 LAB — COMPREHENSIVE METABOLIC PANEL
ALT: 21 U/L (ref 0–44)
AST: 25 U/L (ref 15–41)
Albumin: 3.9 g/dL (ref 3.5–5.0)
Alkaline Phosphatase: 53 U/L (ref 38–126)
Anion gap: 10 (ref 5–15)
BUN: 11 mg/dL (ref 6–20)
CO2: 23 mmol/L (ref 22–32)
Calcium: 9.2 mg/dL (ref 8.9–10.3)
Chloride: 106 mmol/L (ref 98–111)
Creatinine, Ser: 0.83 mg/dL (ref 0.44–1.00)
GFR, Estimated: >60 mL/min (ref >60)
Glucose, Bld: 125 mg/dL — ABNORMAL HIGH (ref 70–99)
Potassium: 3.4 mmol/L — ABNORMAL LOW (ref 3.5–5.1)
Sodium: 139 mmol/L (ref 135–145)
Total Bilirubin: 0.3 mg/dL (ref 0.3–1.2)
Total Protein: 7.9 g/dL (ref 6.5–8.1)

## 2021-03-09 LAB — CBC WITH DIFFERENTIAL/PLATELET
Abs Immature Granulocytes: 0.03 10*3/uL (ref 0.00–0.07)
Basophils Absolute: 0 10*3/uL (ref 0.0–0.1)
Basophils Relative: 0 %
Eosinophils Absolute: 0.1 10*3/uL (ref 0.0–0.5)
Eosinophils Relative: 1 %
HCT: 33.8 % — ABNORMAL LOW (ref 36.0–46.0)
Hemoglobin: 11.7 g/dL — ABNORMAL LOW (ref 12.0–15.0)
Immature Granulocytes: 0 %
Lymphocytes Relative: 28 %
Lymphs Abs: 1.9 10*3/uL (ref 0.7–4.0)
MCH: 32.6 pg (ref 26.0–34.0)
MCHC: 34.6 g/dL (ref 30.0–36.0)
MCV: 94.2 fL (ref 80.0–100.0)
Monocytes Absolute: 0.6 10*3/uL (ref 0.1–1.0)
Monocytes Relative: 9 %
Neutro Abs: 4.2 10*3/uL (ref 1.7–7.7)
Neutrophils Relative %: 62 %
Platelets: 355 10*3/uL (ref 150–400)
RBC: 3.59 MIL/uL — ABNORMAL LOW (ref 3.87–5.11)
RDW: 13.6 % (ref 11.5–15.5)
WBC: 6.9 10*3/uL (ref 4.0–10.5)
nRBC: 0 % (ref 0.0–0.2)

## 2021-03-09 LAB — POCT URINE DRUG SCREEN - MANUAL ENTRY (I-SCREEN)
POC Amphetamine UR: NOT DETECTED
POC Buprenorphine (BUP): NOT DETECTED
POC Cocaine UR: NOT DETECTED
POC Marijuana UR: NOT DETECTED
POC Methadone UR: NOT DETECTED
POC Methamphetamine UR: NOT DETECTED
POC Morphine: NOT DETECTED
POC Oxazepam (BZO): POSITIVE — AB
POC Oxycodone UR: NOT DETECTED
POC Secobarbital (BAR): NOT DETECTED

## 2021-03-09 LAB — HEMOGLOBIN A1C
Hgb A1c MFr Bld: 5.4 % (ref 4.8–5.6)
Mean Plasma Glucose: 108.28 mg/dL

## 2021-03-09 LAB — LIPID PANEL
Cholesterol: 160 mg/dL (ref 0–200)
HDL: 52 mg/dL (ref >40)
LDL Cholesterol: 103 mg/dL — ABNORMAL HIGH (ref 0–99)
Total CHOL/HDL Ratio: 3.1 RATIO
Triglycerides: 25 mg/dL (ref <150)
VLDL: 5 mg/dL (ref 0–40)

## 2021-03-09 LAB — POCT PREGNANCY, URINE: Preg Test, Ur: NEGATIVE

## 2021-03-09 LAB — POC SARS CORONAVIRUS 2 AG: SARSCOV2ONAVIRUS 2 AG: NEGATIVE

## 2021-03-09 LAB — RESP PANEL BY RT-PCR (FLU A&B, COVID) ARPGX2
Influenza A by PCR: NEGATIVE
Influenza B by PCR: NEGATIVE
SARS Coronavirus 2 by RT PCR: NEGATIVE

## 2021-03-09 LAB — VALPROIC ACID LEVEL: Valproic Acid Lvl: <10 ug/mL — ABNORMAL LOW (ref 50.0–100.0)

## 2021-03-09 MED ORDER — OLANZAPINE 5 MG PO TBDP: 5.0000 mg | ORAL_TABLET | Freq: Two times a day (BID) | ORAL | 0 refills | Status: DC

## 2021-03-09 MED ORDER — OLANZAPINE 5 MG PO TBDP: 5.0000 mg | ORAL_TABLET | Freq: Three times a day (TID) | ORAL | Status: DC | PRN

## 2021-03-09 MED ORDER — LORAZEPAM 1 MG PO TABS: 1.0000 mg | ORAL_TABLET | ORAL | Status: DC | PRN

## 2021-03-09 MED ORDER — OLANZAPINE 5 MG PO TBDP
5.0000 mg | ORAL_TABLET | Freq: Two times a day (BID) | ORAL | Status: DC
  Filled 2021-03-09: qty 1

## 2021-03-09 MED ORDER — OLANZAPINE 10 MG IM SOLR
5.0000 mg | Freq: Two times a day (BID) | INTRAMUSCULAR | Status: DC
Start: 2021-03-09 — End: 2021-03-09
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
## 2021-03-09 NOTE — ED Notes (Signed)
This tech and Estill Bamberg the [employer] to get Pt. To do EKG but she refused.  ?

## 2021-03-09 NOTE — ED Notes (Signed)
Provided towels and washcloths for Pt to take a shower.  ?

## 2021-03-09 NOTE — ED Notes (Signed)
Patien tis speaking with NP. Patient is safe on unit with continued monitoring. ?

## 2021-03-09 NOTE — ED Notes (Signed)
Pt woke up demanding to leave and go see her husband. Writer explained she need to stay to see doctor. She started getting louder and saying it is wrong to treat this little girl like this. She is maniac, loud has tangential thoughts. She was given towels, wash cloths and toiletries to take a shower. She is in bathroom talking loudly to herself  ?

## 2021-03-09 NOTE — ED Notes (Signed)
Pt refused vitals stated she is not taking vitals. Pt started yelling at staff saying I just woke up I am not taking vitals get out my face.  ?

## 2021-03-09 NOTE — ED Notes (Signed)
Call to [facility] lab to add on valproic acid level. ?
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
## 2021-03-09 NOTE — Discharge Instructions (Addendum)

## 2021-03-09 NOTE — ED Notes (Signed)
Pt requested for her personal belongings to be returned and to sit in the front lobby for her transportation to arrive. Informed Pt that this nurse would have to get permission from the provider (via secure chat).  ?

## 2021-03-09 NOTE — ED Notes (Signed)
Patient woke briefly and requested to see NP. Patient is loud, verbally aggressive and demeaning. Patient is lying in her bed yelling about her needs, wants and medication. Patient is requesting tyo leave and have her medication sent to the pharmacy for pick up.  ?

## 2021-03-09 NOTE — ED Notes (Signed)
Patient took shower. Patient continues to speak out loud. Patient is rambling random sentences unorganized speech. Patient is now sleep. Patient is safe on unit with continued monitoring. ?

## 2021-03-09 NOTE — ED Notes (Addendum)
Pt admitted to continuous assessment due to manic behaviors. Pt A&O to self, place, and time. Pt denies SI/HI/AVH. Pt is requesting only white female staff, stating she "doesn't like or trust black people or men." Pt required frequent redirection when completing lab work and skin assessment. Pt refused to provide urine specimen at this time stating she "hasn't eaten or drank anything in 30 months." Pt then laid down on the floor, stating she was "paralyzed" and would not allow staff to complete EKG. Leandro Reasoner, NP made aware. Staff and MHT assisted pt up and pt ambulated independently to unit. Oriented to unit/staff. Sandwich, chips, and juice given per pt request. No signs of acute distress noted. Will continue to monitor for safety. ?

## 2021-03-09 NOTE — ED Provider Notes (Signed)
FBC/OBS ASAP Discharge Summary  Date and Time: 03/09/2021 11:19 AM  Name: [name] [name]  MRN:  [id number]   Discharge Diagnoses:  Final diagnoses:  Schizoaffective disorder, bipolar type ([place])  Manic behavior ([place])    Subjective: Patient states "I am a lot better, I had not been sleeping well since December."  Patient endorses readiness to discharge home.  Reports feeling better after improved sleep.  [name] [name] states "I went to [facility] and they gave me Depakote, I can only sleep when I take Zyprexa."  Recent stressors include moving from her boardinghouse to a hotel.  She reports she initially believed her significant other, [name] [name], would moved to the hotel as well.  It appears at this time he has not decided if he will move to the hotel along with patient.   [name] [name] has been diagnosed with schizoaffective disorder and schizophrenia.  She was released from [facility] approximately 1 week ago.  She was not compliant with Depakote, valproic acid level currently less than 10.  She reports she would prefer to be prescribed olanzapine as this medication has been effective for her in the past.  She shares she is followed by [facility] behavioral health for medication management.  Recently her prescribing provider has changed, she would like to consider alternative outpatient medication management resources.  She is not followed by outpatient counseling, plans to do so moving forward.  Patient is reassessed, face-to-face, by this practitioner.  She is seated in observation area, no acute distress.  She is alert and oriented, cooperative during assessment.  She presents with euthymic mood, labile affect. She denies suicidal and homicidal ideations.  Se denies history of suicide attempts, denies history of nonsuicidal self-harm behavior.  She contracts verbally for safety with this Probation officer. She denies auditory and visual hallucinations.  Patient is able to converse coherently with  goal-directed thoughts.   There is no indication that patient is responding to internal stimuli and no evidence of delusional thought content. She denies alcohol and substance use.   Precious resides in [place] with her significant other.  Patient endorses average sleep and appetite.  Patient offered support and encouragement. She gives verbal consent to speak with boyfriend [place] [phone]. Spoke with [name] Lanka who shares that Umeka becomes oppositional when not compliant with medications.  He verbalizes plan to pick up patient after church today.  Patient and caregiver are educated and verbalize understanding of mental health resources and other crisis services in the community. Instructed to call 911 and present to the nearest emergency room should she experience any suicidal/homicidal ideation, auditory/visual/hallucinations, or detrimental worsening of her mental health condition.   Stay Summary: HPI from 03/08/2021 2307pm: [name] [name] is a 41 year old female with a psychiatric history schizoaffective disorder, bipolar type.  Patient presented voluntarily to [facility]. Patient is accompanied by her significant other Mr. [name] [name]. Patient gave verbal consent for Mr Berenice Primas to participate in her assessment. Patient presented requesting prescription for  Zyprexa.    Patient was seen face to face and her chart was reviewed by this provider.  On evaluation, patient is alert and oriented to self, place, and time.  Patient is noted with   manic symptoms. Patient's speech is pressured; she is grandiose, her thought process is  disorganized thought process, and she demonstrates flight of ideas. 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 Patient's mood is irritable.   Patient report that she was recently hospitalized from [facility].  Patient says the doctor at [facility] was unfamiliar with her and erroneously placed her on Depakote. Patient reports that  she stopped taking Depakote upon discharging from [facility]. She reports that she would like to placed on Zyprexa.    Mr. Berenice Primas reports that the patient has been acting erractic for about a week. He reports that patient threw away her Depakote and has not been taking any of her prescribed medications. He also reports that patient has not slept in the past 5 days. He says patient stays up all night exercising and making noise. He reports that patient was recently evicted from her boarding house due to  erratic behavior.    Total Time spent with patient: 30 minutes  Past Psychiatric History: Bipolar affective disorder, schizophrenia Past Medical History:  Past Medical History:  Diagnosis Date   Bipolar affective disorder, currently manic, mild ([place])    Diabetes mellitus without complication ([place])    Schizophrenia ([place])     Past Surgical History:  Procedure Laterality Date   CESAREAN SECTION  04/2018   WISDOM TOOTH EXTRACTION     Family History:  Family History  Problem Relation Age of Onset   Drug abuse Maternal Uncle    Family Psychiatric History: none reported Social History:  Social History   Substance and Sexual Activity  Alcohol Use Not Currently     Social History   Substance and Sexual Activity  Drug Use Not Currently    Social History   Socioeconomic History   Marital status: Legally Separated    Spouse name: Not on file   Number of children: Not on file   Years of education: Not on file   Highest education level: Not on file  Occupational History   Not on file  Tobacco Use   Smoking status: Never   Smokeless tobacco: Never  Vaping Use   Vaping Use: Never used  Substance and Sexual Activity   Alcohol use: Not Currently   Drug use: Not Currently   Sexual activity: Not Currently  Other Topics Concern   Not on file  Social History Narrative   ** Merged History Encounter **       ** Merged History Encounter **       Social Determinants of Health    Financial Resource Strain: Not on file  Food Insecurity: Not on file  Transportation Needs: Not on file  Physical Activity: Not on file  Stress: Not on file  Social Connections: Not on file   SDOH:  SDOH Screenings   Alcohol Screen: Not on file  Depression (PHQ2-9): Not on file  Financial Resource Strain: Not on file  Food Insecurity: Not on file  Housing: Not on file  Physical Activity: Not on file  Social Connections: Not on file  Stress: Not on file  Tobacco Use: Low Risk    Smoking Tobacco Use: Never   Smokeless Tobacco Use: Never   Passive Exposure: Not on file  Transportation Needs: Not on file    Tobacco Cessation:  N/A, patient does not currently use tobacco products  Current Medications:  Current Facility-Administered Medications  Medication Dose Route Frequency Provider Last Rate Last Admin   acetaminophen (TYLENOL) tablet 650 mg  650 mg Oral Q6H PRN Ajibola, Ene A, NP       alum & mag hydroxide-simeth (MAALOX/MYLANTA) 200-200-20 MG/5ML suspension 30 mL  30 mL Oral Q4H PRN Ajibola, Ene A, NP       hydrOXYzine (ATARAX) tablet 25 mg  25  mg Oral TID PRN Ajibola, Ene A, NP       OLANZapine zydis (ZYPREXA) disintegrating tablet 5 mg  5 mg Oral Q8H PRN Lucky Rathke, FNP       And   LORazepam (ATIVAN) tablet 1 mg  1 mg Oral PRN Lucky Rathke, FNP       magnesium hydroxide (MILK OF MAGNESIA) suspension 30 mL  30 mL Oral Daily PRN Ajibola, Ene A, NP       OLANZapine zydis (ZYPREXA) disintegrating tablet 5 mg  5 mg Oral BID Lucky Rathke, FNP       Or   OLANZapine (ZYPREXA) injection 5 mg  5 mg Intramuscular BID Lucky Rathke, FNP       Current Outpatient Medications  Medication Sig Dispense Refill   divalproex (DEPAKOTE ER) 250 MG 24 hr tablet Take 1 tablet (250 mg total) by mouth every morning. (Patient not taking: Reported on 02/17/2021) 30 tablet 0   divalproex (DEPAKOTE ER) 500 MG 24 hr tablet Take 1 tablet (500 mg total) by mouth at bedtime. (Patient not taking:  Reported on 02/17/2021) 30 tablet 0   metoprolol succinate (TOPROL-XL) 25 MG 24 hr tablet Take 1 tablet (25 mg total) by mouth daily. (Patient not taking: Reported on 10/09/2020) 30 tablet 0   OLANZapine zydis (ZYPREXA) 5 MG disintegrating tablet Take 1 tablet (5 mg total) by mouth 2 (two) times daily. 60 tablet 0   Prenatal Vit-Fe Fumarate-FA (PRENATAL MULTIVITAMIN) TABS tablet Take 1 tablet by mouth daily at 12 noon. (Patient not taking: Reported on 09/21/2020) 30 tablet 0   spironolactone (ALDACTONE) 25 MG tablet Take 0.5 tablets (12.5 mg total) by mouth daily. (Patient not taking: Reported on 10/09/2020) 15 tablet 0    PTA Medications: (Not in a hospital admission)   Musculoskeletal  Strength & Muscle Tone: within normal limits Gait & Station: normal Patient leans: N/A  Psychiatric Specialty Exam  Presentation  General Appearance: Appropriate for Environment; Fairly Groomed  Eye Contact:Good  Speech:Normal Rate; Clear and Coherent  Speech Volume:Increased  Handedness:Right   Mood and Affect  Mood:Euthymic; Labile  Affect:Congruent   Thought Process  Thought Processes:Coherent; Goal Directed; Linear  Descriptions of Associations:Intact  Orientation:Full (Time, Place and Person)  Thought Content:Logical  Diagnosis of Schizophrenia or Schizoaffective disorder in past: Yes  Duration of Psychotic Symptoms: Greater than six months   Hallucinations:Hallucinations: None  Ideas of Reference:None  Suicidal Thoughts:Suicidal Thoughts: No  Homicidal Thoughts:Homicidal Thoughts: No   Sensorium  Memory:Immediate Good; Recent Fair  Judgment:Intact  Insight:Present   Executive Functions  Concentration:Fair  Attention Span:Fair  [place] of Knowledge:Good  Language:Good   Psychomotor Activity  Psychomotor Activity:Psychomotor Activity: Normal   Assets  Assets:Communication Skills; Financial Resources/Insurance; Intimacy; Leisure Time; Physical  Health; Resilience; Social Support; Housing   Sleep  Sleep:Sleep: Good Number of Hours of Sleep: 0 (patient has not slept in 5 days)   Nutritional Assessment (For OBS and FBC admissions only) Has the patient had a weight loss or gain of 10 pounds or more in the last 3 months?: No Has the patient had a decrease in food intake/or appetite?: No Does the patient have dental problems?: No Does the patient have eating habits or behaviors that may be indicators of an eating disorder including binging or inducing vomiting?: No Has the patient recently lost weight without trying?: 0 Has the patient been eating poorly because of a decreased appetite?: 0 Malnutrition Screening Tool Score: 0  Physical Exam  Physical Exam Vitals and nursing note reviewed.  Constitutional:      Appearance: Normal appearance. She is well-developed.  HENT:     Head: Normocephalic and atraumatic.     Nose: Nose normal.  Cardiovascular:     Rate and Rhythm: Normal rate.  Pulmonary:     Effort: Pulmonary effort is normal.  Musculoskeletal:        General: Normal range of motion.     Cervical back: Normal range of motion.  Skin:    General: Skin is warm and dry.  Neurological:     Mental Status: She is alert and oriented to person, place, and time.  Psychiatric:        Attention and Perception: Attention and perception normal.        Mood and Affect: Mood normal. Affect is labile.        Speech: Speech normal.        Behavior: Behavior normal. Behavior is cooperative.        Thought Content: Thought content normal.        Cognition and Memory: Cognition and memory normal.   Review of Systems  Constitutional: Negative.   HENT: Negative.    Eyes: Negative.   Respiratory: Negative.    Cardiovascular: Negative.   Gastrointestinal: Negative.   Genitourinary: Negative.   Musculoskeletal: Negative.   Skin: Negative.   Neurological: Negative.   Endo/Heme/Allergies: Negative.   Psychiatric/Behavioral:  Negative.    Blood pressure 94/66, pulse 84, temperature 98.6 F (37 C), temperature source Oral, resp. rate 16, SpO2 100 %, unknown if currently breastfeeding. There is no height or weight on file to calculate BMI.  Demographic Factors:  Unemployed  Loss Factors: Financial problems/change in socioeconomic status  Historical Factors: Impulsivity  Risk Reduction Factors:   Living with another person, especially a relative, Positive social support, Positive therapeutic relationship, and Positive coping skills or problem solving skills  Continued Clinical Symptoms:  Previous Psychiatric Diagnoses and Treatments  Cognitive Features That Contribute To Risk:  None    Suicide Risk:  Minimal: No identifiable suicidal ideation.  Patients presenting with no risk factors but with morbid ruminations; may be classified as minimal risk based on the severity of the depressive symptoms  Plan Of Care/Follow-up recommendations:  Patient reviewed with Dr Hampton Abbot. Follow up with outpatient psychiatry, resources provided.  Continue current medications including: -Olanzapine 5 mg twice daily -Divalproex 500 mg nightly and 250 mg every morning  Disposition: Discharge  Lucky Rathke, FNP 03/09/2021, 11:19 AM

## 2021-03-09 NOTE — ED Notes (Signed)
Pt refused to get up from her pull out bed and come to the window for her prescribed Zyprexa. She opened her eyes briefly and then closed them again when this nurse touched her leg to inform her it was time for her meds. Will inform provider via secure chat. ?

## 2021-03-09 NOTE — ED Notes (Signed)
Patient was verbally aggressive upon arrival for shift change. Patient expressed her needs and demanded to speak with NP. Patient was supported aeb MHT meeting client where she was at aeb providing breakfast, informing patient of the process after shift change, and when an NP will be available for reassessment. Patient is no2w laying in assigned area asleep. ?

## 2021-03-09 NOTE — ED Notes (Signed)
Pt sleeping@this time. Breathing even and unlabored. Will continue to monitor for safety 

## 2021-03-09 NOTE — ED Notes (Addendum)
Pt has rambling conversation. Talking about how she threw her keys away because she did not want her car to be stolen. She stated she gave her landlord money for a room that she has now been kicked out of because "she could not take a cold shower, the water was not hot", she also stated that she can "only take zyprexa and not take lamictal". Her conversation goes from one thing to another in a monotone voice. Encouraged Pt to drink some juice in order to provide a urine sample. Informed provider via secure chat of rambling conversation.  ?

## 2021-03-09 NOTE — BH Assessment (Signed)
Comprehensive Clinical Assessment (CCA) Note  03/09/2021 [name] [name] [id number]  Disposition: [name] Reasoner, NP recommends pt to be admitted to [facility] for Continuous Assessment.   [place] ED from 03/08/2021 in [facility] ED from 02/17/2021 in [place] [facility] from 11/27/2020 in [place] No Risk No Risk No Risk      The patient demonstrates the following risk factors for suicide: Chronic risk factors for suicide include: psychiatric disorder of Schizoaffective Disorder, Bipolar Type ([name] [name]) . Acute risk factors for suicide include:  UTA . Protective factors for this patient include: positive social support. Considering these factors, the overall suicide risk at this point appears to be no risk. Patient is appropriate for outpatient follow up.  [name] [name] is a 41 year old female and accompanied by her significant other and church member ([name] [name].) Pt was a poor historian during the assessment and answered very few questions. Clinician asked the pt, "what brought you to the hospital?"  Pt reports, she takes Zyprexa to help with sleep. Pt would not let her significant other provide additional information however she continued to talk over everyone. During the assessment pt expressed she has an appointment to see Dr. Ennis Forts for medication on March 8th and she wants a white doctor. Pt reports, she was [employer] in [place], she lived at [employer]; she's a Chief Executive Officer, a Radio broadcast assistant, a professor, she talked the a judge about the [place]. Pt made continued inappropriate remarks about African Americans staff including their negative comments about their skin complexion, how she does not want them to touch her (when no one was trying to touch her). Pt reports, black people are mean. Pt became combative when the psychiatric provider discussed the disposition, she  attempted to leave and asked her significant other to take her with him which he declined. Per significant other the pt has been evicted and banned from a property.   Pt's significant other expressed the pt does not know the race of Dr. Ennis Forts, even thought pt prefers a white provider, pt expressed she doesn't care. Pt reports, she hasn't taken her medications in months. Per chart, pt was accepted to [facility] on 02/20/2021. Pt has previous inpatient admissions.   Pt presents alert in layered clothing with loud, flight of ideas, pressured speech. Pt's mood was irritable. Pt's affect was congruent. Pt's insight was lacking. Pt's judgement was impaired.   Diagnosis: Schizoaffective Disorder, Bipolar Type ([name] [name]).    Pt was assessed at [facility] on 02/18/2021 by [name] Novak, MS, [facility], CRC see below:   [name] [name] is a 41 year old female who presents involuntary and unaccompanied to [facility]. Clinician asked the pt, "what brought you to the hospital?" Pt reports, "they lied on me said I was outside without clothes on." Pt reports, she was outside gardening waiting on her landlord to unlock her bathroom but was handcuffed and taken to the hospital. Pt reports, she had a dress on. Pt reports, "don't be made at my accent," as she changed to speaking in a [place] accent. Pt continued to yell that she was waiting on her landlord to unlock her bathroom, she had clothes on and wanted to go home. Pt reports, it was unfair that the cop handcuffed in front of her door. When clinician attempted to asked questions pt would reply, "that's none your business, he has nothing to do with it, etc."  Pt denies, SI, HI,  AVH, self-injurious behaviors and access to weapons.    Pt was IVC'd by her pastor ([name] [name], [phone]). Per IVC paperwork: "[name] is diagnosis as Bipolar/Schizophrenia. [name] is off her medication. [name] is very aggressive, she is standing in the middle of the street in the rain.  [name] is talking out of her head, saying things that don't make sense. [name] grabbed a pair of sheers and started towards the officers with them. [name] is danger to herself an others at this time and needs to be evaluated for possible mental illness."    Pt denies, substance use. Pt's UDS is pending. Pt reports, she does not need to see a psychiatrist or therapist. Pt reports, her doctor in [place] prescribed her Zyprexa but was prescribed Depakote by providers. Pt has previous inpatient admissions.    Pt presents alert with loud speech. During the assessment pt changed her accent to a [place] accent.  Pt reports, she from [place]. Pt's mood, was irritable. Pt's affect was congruent. Pt's insight was lacking. Pt's judgement is impaired. Pt reports, she wants to go home.    Diagnosis: Schizoaffective Disorder, Bipolar Type ([place]).   *Clinician attempted to contact IVC petitioner. Clinician did not leave a voice message and there was identifying information.*  Chief Complaint:  Chief Complaint  Patient presents with   Manic Behavior   Schizophrenia   Visit Diagnosis:     CCA Screening, Triage and Referral (STR)  Patient Reported Information How did you hear about [place]? Family/Friend  What Is the Reason for Your Visit/Call Today? Pt presents to [facility] accompanied by her friend. Pt came to this facility earlier but refused to be put into the system. Pt is agitated, speaking loudly, speaking erratically, tangential speak, unable to track. Pt refused vitals " Do not touch me, I don't want to be touched". Pt states ." I do not want to be seen here, where is my doctor her name is CC".Unable to complete triage process due to manic behavior.  How Long Has This Been Causing You Problems? > than 6 months  What Do You Feel Would Help You the Most Today? Treatment for Depression or other mood problem   Have You Recently Had Any Thoughts About Hurting Yourself? No (UTA)  Are  You Planning to Commit Suicide/Harm Yourself At This time? No (UTA)   Have you Recently Had Thoughts About [place]? No (UTA)  Are You Planning to Harm Someone at This Time? No (UTA)  Explanation: No data recorded  Have You Used Any Alcohol or Drugs in the Past 24 Hours? No (UTA)  How Long Ago Did You Use Drugs or Alcohol? No data recorded What Did You Use and How Much? No data recorded  Do You Currently Have a Therapist/Psychiatrist? No (Pt reports, she does not need one.)  Name of Therapist/Psychiatrist: Pt was unable to provide the name of her psychiatrist and counselor.   Have You Been Recently Discharged From Any Office Practice or Programs? No data recorded Explanation of Discharge From Practice/Program: No data recorded    CCA Screening Triage Referral Assessment Type of Contact: Tele-Assessment  Telemedicine Service Delivery:   Is this Initial or Reassessment? Initial Assessment  Date Telepsych consult ordered in [facility]:  02/17/21  Time Telepsych consult ordered in [facility]:  1417  Location of Assessment: [facility]  Provider Location: [facility] Assessment Services   Collateral Involvement: Pt was IVC'd by her pastor ([place], [phone]).   Does Patient Have a Court  Appointed Legal Guardian? No data recorded Name and Contact of Legal Guardian: No data recorded If Minor and Not Living with Parent(s), Who has Custody? No data recorded Is CPS involved or ever been involved? No data recorded Is APS involved or ever been involved? No data recorded  Patient Determined To Be At Risk for Harm To Self or Others Based on Review of Patient Reported Information or Presenting Complaint? Yes, for Self-Harm  Method: No data recorded Availability of Means: No data recorded Intent: No data recorded Notification Required: No data recorded Additional Information for Danger to Others Potential: No data recorded Additional Comments for Danger to Others Potential: No data  recorded Are There Guns or Other Weapons in Your Home? No data recorded Types of Guns/Weapons: No data recorded Are These Weapons Safely Secured?                            No data recorded Who Could Verify You Are Able To Have These Secured: No data recorded Do You Have any Outstanding Charges, Pending Court Dates, Parole/Probation? No data recorded Contacted To Inform of Risk of Harm To Self or Others: Law Enforcement    Does Patient Present under Involuntary Commitment? Yes  IVC Papers Initial File Date: 02/17/21   [place] of Residence: [place]   Patient Currently Receiving the Following Services: Not Receiving Services   Determination of Need: Urgent (48 hours)   Options For Referral: Inpatient Hospitalization; Medication Management     CCA Biopsychosocial Patient Reported Schizophrenia/Schizoaffective Diagnosis in Past: Yes   Strengths: UTA   Mental Health Symptoms Depression:   Sleep (too much or little); Irritability   Duration of Depressive symptoms:    Mania:   Increased Energy; Racing thoughts   Anxiety:    Restlessness; Irritability   Psychosis:   -- (UTA)   Duration of Psychotic symptoms:    Trauma:   -- (UTA)   Obsessions:   -- (UTA)   Compulsions:   -- (UTA)   Inattention:   -- (UTA)   Hyperactivity/Impulsivity:   Feeling of restlessness; Fidgets with hands/feet   Oppositional/Defiant Behaviors:   Angry; Argumentative   Emotional Irregularity:   Potentially harmful impulsivity   Other Mood/Personality Symptoms:  No data recorded   Mental Status Exam Appearance and self-care  Stature:   Average   Weight:   Average weight   Clothing:   -- (Layer clothing.)   Grooming:   Normal   Cosmetic use:   None   Posture/gait:   Other (Comment) (Pt sitting up in bed.)   Motor activity:   Agitated   Sensorium  Attention:   Inattentive; Persistent   Concentration:   Focuses on irrelevancies; Preoccupied; Scattered    Orientation:   Person; Place   Recall/memory:   Defective in Immediate; Defective in Recent   Affect and Mood  Affect:   Congruent   Mood:   Irritable   Relating  Eye contact:   Normal   Facial expression:   Anxious   Attitude toward examiner:   Irritable; Guarded; Suspicious   Thought and Language  Speech flow:  Loud; Pressured; Flight of Ideas   Thought content:   Delusions   Preoccupation:   Other (Comment) (During the assessment pt made many grandiose statements, pt did not want to talk to black staff.)   Hallucinations:   Other (Comment) (UTA)   Organization:  No data recorded  [employer] of Knowledge:  Average; Poor   Intelligence:   Average   Abstraction:  No data recorded  Judgement:   Impaired   Reality Testing:   Distorted   Insight:   Lacking   Decision Making:   Impulsive   Social Functioning  Social Maturity:   Impulsive   Social Judgement:   Heedless   Stress  Stressors:   Other (Comment) (UTA)   Coping Ability:   Overwhelmed; Exhausted   Skill Deficits:   Decision making; Self-control; Communication; Responsibility; Interpersonal   Supports:   Church     Religion: Religion/Spirituality Are You A Religious Person?:  Special educational needs teacher)  Leisure/Recreation: Leisure / Recreation Do You Have Hobbies?:  (UTA) Leisure and Hobbies: Gardening.  Exercise/Diet: Exercise/Diet Do You Exercise?:  (UTA) Have You Gained or Lost A Significant Amount of Weight in the Past Six Months?:  (UTA) Do You Follow a Special Diet?:  (UTA) Do You Have Any Trouble Sleeping?:  (UTA)   CCA Employment/Education Employment/Work Situation: Employment / Work Situation Employment Situation:  (UTA) Has Patient ever Been in Passenger transport manager?:  Special educational needs teacher)  Education: Education Is Patient Currently Attending School?:  (UTA) Last Grade Completed:  (UTA) Did You Attend College?:  (UTA) Did You Have An Individualized Education Program (IIEP):   (UTA) Did You Have Any Difficulty At School?:  (UTA) Patient's Education Has Been Impacted by Current Illness:  (UTA)   CCA Family/Childhood History Family and Relationship History: Family history Marital status: Other (comment) (During the assessment pt listed [name] Lanka as her boyfriend and her husband.) Does patient have children?: Yes How many children?: 2 (Per chart.) How is patient's relationship with their children?: UTA  Childhood History:  Childhood History By whom was/is the patient raised?:  (UTA) Did patient suffer any verbal/emotional/physical/sexual abuse as a child?:  (UTA) Did patient suffer from severe childhood neglect?:  (UTA) Has patient ever been sexually abused/assaulted/raped as an adolescent or adult?:  (UTA) Was the patient ever a victim of a crime or a disaster?:  (UTA) Witnessed domestic violence?:  (UTA) Has patient been affected by domestic violence as an adult?:  Special educational needs teacher)  Child/Adolescent Assessment:     CCA Substance Use Alcohol/Drug Use: Alcohol / Drug Use Pain Medications: See MAR Prescriptions: See MAR Over the Counter: See MAR History of alcohol / drug use?:  (UTA)    ASAM's:  Six Dimensions of Multidimensional Assessment  Dimension 1:  Acute Intoxication and/or Withdrawal Potential:      Dimension 2:  Biomedical Conditions and Complications:      Dimension 3:  Emotional, Behavioral, or Cognitive Conditions and Complications:     Dimension 4:  Readiness to Change:     Dimension 5:  Relapse, Continued use, or Continued Problem Potential:     Dimension 6:  Recovery/Living Environment:     ASAM Severity Score:    ASAM Recommended Level of Treatment:     Substance use Disorder (SUD)    Recommendations for Services/Supports/Treatments: Recommendations for Services/Supports/Treatments Recommendations For Services/Supports/Treatments: Other (Comment) (Pt to be admitted to [facility] for Continuous Assessment.)  Discharge Disposition:    DSM5  Diagnoses: Patient Active Problem List   Diagnosis Date Noted   Manic behavior ([place]) 10/09/2020   Psychosis ([place]) 07/13/2019   Labor without complication 59/56/3875   Indication for care in labor or delivery 07/10/2019   Third trimester pregnancy 07/06/2019   [redacted] weeks gestation of pregnancy    AMA (advanced maternal age) multigravida 35+, third trimester 07/04/2019   Supervision of high risk pregnancy, antepartum 07/04/2019  No prenatal care in current pregnancy in third trimester 07/04/2019   Obesity in pregnancy 07/04/2019   BMI 30s 07/04/2019   History of cesarean delivery 07/04/2019   Short interval between pregnancies affecting pregnancy in third trimester, antepartum 07/04/2019   Adjustment disorder with mixed disturbance of emotions and conduct 07/11/2017   Acute psychosis ([place]) 12/21/2015   Insomnia    Anxiety state    Overactive bladder    Diabetes mellitus ([place]) 02/08/2015   Schizoaffective disorder, bipolar type ([place]) 01/28/2015   Non compliance w medication regimen      Referrals to Alternative Service(s): Referred to Alternative Service(s):   Place:   Date:   Time:    Referred to Alternative Service(s):   Place:   Date:   Time:    Referred to Alternative Service(s):   Place:   Date:   Time:    Referred to Alternative Service(s):   Place:   Date:   Time:     [name] [name], [facility] Comprehensive Clinical Assessment (CCA) Screening, Triage and Referral Note  03/09/2021 Ader Fritze [id number]  Chief Complaint:  Chief Complaint  Patient presents with   Manic Behavior   Schizophrenia   Visit Diagnosis:   Patient Reported Information How did you hear about [place]? Family/Friend  What Is the Reason for Your Visit/Call Today? Pt presents to [facility] accompanied by her friend. Pt came to this facility earlier but refused to be put into the system. Pt is agitated, speaking loudly, speaking erratically, tangential speak, unable to track. Pt refused vitals " Do  not touch me, I don't want to be touched". Pt states ." I do not want to be seen here, where is my doctor her name is CC".Unable to complete triage process due to manic behavior.  How Long Has This Been Causing You Problems? > than 6 months  What Do You Feel Would Help You the Most Today? Treatment for Depression or other mood problem   Have You Recently Had Any Thoughts About Hurting Yourself? No (UTA)  Are You Planning to Commit Suicide/Harm Yourself At This time? No (UTA)   Have you Recently Had Thoughts About [place]? No (UTA)  Are You Planning to Harm Someone at This Time? No (UTA)  Explanation: No data recorded  Have You Used Any Alcohol or Drugs in the Past 24 Hours? No (UTA)  How Long Ago Did You Use Drugs or Alcohol? No data recorded What Did You Use and How Much? No data recorded  Do You Currently Have a Therapist/Psychiatrist? No (Pt reports, she does not need one.)  Name of Therapist/Psychiatrist: Pt was unable to provide the name of her psychiatrist and counselor.   Have You Been Recently Discharged From Any Office Practice or Programs? No data recorded Explanation of Discharge From Practice/Program: No data recorded   CCA Screening Triage Referral Assessment Type of Contact: Tele-Assessment  Telemedicine Service Delivery:   Is this Initial or Reassessment? Initial Assessment  Date Telepsych consult ordered in [facility]:  02/17/21  Time Telepsych consult ordered in [facility]:  1417  Location of Assessment: [facility] ED  Provider Location: [facility] Assessment Services   Collateral Involvement: Pt was IVC'd by her pastor ([name] [name], [phone]).   Does Patient Have a Stage manager Guardian? No data recorded Name and Contact of Legal Guardian: No data recorded If Minor and Not Living with Parent(s), Who has Custody? No data recorded Is CPS involved or ever been involved? No data recorded Is APS involved  or ever been involved? No data  recorded  Patient Determined To Be At Risk for Harm To Self or Others Based on Review of Patient Reported Information or Presenting Complaint? Yes, for Self-Harm  Method: No data recorded Availability of Means: No data recorded Intent: No data recorded Notification Required: No data recorded Additional Information for Danger to Others Potential: No data recorded Additional Comments for Danger to Others Potential: No data recorded Are There Guns or Other Weapons in Your Home? No data recorded Types of Guns/Weapons: No data recorded Are These Weapons Safely Secured?                            No data recorded Who Could Verify You Are Able To Have These Secured: No data recorded Do You Have any Outstanding Charges, Pending Court Dates, Parole/Probation? No data recorded Contacted To Inform of Risk of Harm To Self or Others: Law Enforcement   Does Patient Present under Involuntary Commitment? Yes  IVC Papers Initial File Date: 02/17/21   [place] of Residence: [place]   Patient Currently Receiving the Following Services: Not Receiving Services   Determination of Need: Urgent (48 hours)   Options For Referral: Inpatient Hospitalization; Medication Management   Discharge Disposition:     [name] [name], [place], [place], [facility], [facility] Triage Specialist [phone]

## 2021-03-09 NOTE — ED Notes (Signed)
Pt currently resting on pull out bed in flex unit. Completed her shower on previous shift. Informed provider for need of med orders via the phone. No s&s of distress. Safety maintained and will continue to monitor.  ?

## 2021-03-09 NOTE — ED Notes (Signed)
Pt refused Zyprexa when offered a second time. ? ?

## 2021-03-09 NOTE — ED Notes (Signed)
Pt has been yelling at staff demending to see a doctor. Pt stated she does not want to talk to black women because they will steal her man. Pt has been banging on the flex window. Pt stated that she wants to leave.  ?

## 2021-03-09 NOTE — ED Notes (Addendum)
Pt stating, "They will not let me shower. They did not tell me if I'm pregnant or not, covid or not. Did not tell me because they are cruel. Acting like they can't hear me. The Bible says they are mischievous and sinful. They just don't want you to do right so you can't go home. They did not have to take your clothes. They will be penalized for stealing". Informed Pt that her belongings were in  locker number 25 earlier this morning when she was under the impression that they were in the washer. Informed Pt that her loved one will be here at noon to pick her up and that she did not have time to take another shower.Safety maintained and will continue to monitor. ?

## 2021-03-09 NOTE — ED Notes (Signed)
Snack given.

## 2021-03-09 NOTE — ED Notes (Signed)
Patient continues to yell out. Patient is not tracking well. Patient sentences are broken. Patient is lying in assigned area. Patient is safe on unit with continued monitoring. ?

## 2021-03-13 ENCOUNTER — Ambulatory Visit (HOSPITAL_COMMUNITY): Payer: Medicare Other | Admitting: Psychiatry

## 2021-03-13 ENCOUNTER — Encounter (HOSPITAL_COMMUNITY): Payer: Self-pay

## 2021-03-26 ENCOUNTER — Telehealth (HOSPITAL_COMMUNITY): Payer: Self-pay

## 2021-03-26 NOTE — BH Assessment (Signed)
Care Management - [place] Follow Up Discharges  ? ?Writer attempted to make contact with patient today and was unsuccessful.  Writer left a HIPPA compliant voice message.  ? ?Per chart review, patient is followed by [facility] behavioral health for medication management and she does not receive outpatient therapy.  ?

## 2021-10-19 ENCOUNTER — Ambulatory Visit (HOSPITAL_COMMUNITY)
Admission: EM | Admit: 2021-10-19 | Discharge: 2021-10-19 | Disposition: A | Discharge status: Home / Self Care | Payer: Medicare Other | Attending: Psychiatry | Admitting: Psychiatry

## 2021-10-19 ENCOUNTER — Ambulatory Visit (HOSPITAL_COMMUNITY)
Admission: EM | Admit: 2021-10-19 | Discharge: 2021-10-19 | Discharge status: Against Medical Advice | Payer: Medicare Other

## 2021-10-19 DIAGNOSIS — F3010 Manic episode without psychotic symptoms, unspecified: Secondary | ICD-10-CM | POA: Diagnosis present

## 2021-10-19 NOTE — Discharge Instructions (Signed)
Take all medications as prescribed. Keep all follow-up appointments as scheduled.  Do not consume alcohol or use illegal drugs while on prescription medications. Report any adverse effects from your medications to your primary care provider promptly.  In the event of recurrent symptoms or worsening symptoms, call 911, a crisis hotline, or go to the nearest emergency department for evaluation.   
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
## 2021-10-19 NOTE — ED Triage Notes (Signed)
Pt presents to [facility] accompanied by her friend seeking a medication refill. Pts friend reports that the pt has been out of her medication for the past 3 days. Pt is being seen by provider during triage. Pt appears to be disorganized at the moment.

## 2021-10-20 ENCOUNTER — Other Ambulatory Visit: Payer: Self-pay

## 2021-10-20 ENCOUNTER — Emergency Department (EMERGENCY_DEPARTMENT_HOSPITAL)
Admission: EM | Admit: 2021-10-20 | Discharge: 2021-10-22 | Disposition: A | Discharge status: Other Acute Inpatient Hospital | Payer: Medicare Other | Source: Home / Self Care | Attending: Emergency Medicine | Admitting: Emergency Medicine

## 2021-10-20 ENCOUNTER — Ambulatory Visit (INDEPENDENT_AMBULATORY_CARE_PROVIDER_SITE_OTHER)
Admission: EM | Admit: 2021-10-20 | Discharge: 2021-10-20 | Disposition: A | Discharge status: Other Acute Inpatient Hospital | Payer: Medicare Other | Source: Home / Self Care

## 2021-10-20 DIAGNOSIS — F22 Delusional disorders: Secondary | ICD-10-CM | POA: Insufficient documentation

## 2021-10-20 DIAGNOSIS — E119 Type 2 diabetes mellitus without complications: Secondary | ICD-10-CM | POA: Insufficient documentation

## 2021-10-20 DIAGNOSIS — F23 Brief psychotic disorder: Secondary | ICD-10-CM | POA: Insufficient documentation

## 2021-10-20 DIAGNOSIS — Z1152 Encounter for screening for COVID-19: Secondary | ICD-10-CM | POA: Insufficient documentation

## 2021-10-20 DIAGNOSIS — F309 Manic episode, unspecified: Secondary | ICD-10-CM | POA: Insufficient documentation

## 2021-10-20 DIAGNOSIS — R4789 Other speech disturbances: Secondary | ICD-10-CM | POA: Insufficient documentation

## 2021-10-20 DIAGNOSIS — F250 Schizoaffective disorder, bipolar type: Secondary | ICD-10-CM | POA: Insufficient documentation

## 2021-10-20 DIAGNOSIS — F3010 Manic episode without psychotic symptoms, unspecified: Secondary | ICD-10-CM

## 2021-10-20 DIAGNOSIS — Z91148 Patient's other noncompliance with medication regimen for other reason: Secondary | ICD-10-CM | POA: Insufficient documentation

## 2021-10-20 DIAGNOSIS — Z7984 Long term (current) use of oral hypoglycemic drugs: Secondary | ICD-10-CM | POA: Insufficient documentation

## 2021-10-20 DIAGNOSIS — Z20822 Contact with and (suspected) exposure to covid-19: Secondary | ICD-10-CM | POA: Insufficient documentation

## 2021-10-20 DIAGNOSIS — R451 Restlessness and agitation: Secondary | ICD-10-CM | POA: Insufficient documentation

## 2021-10-20 LAB — COMPREHENSIVE METABOLIC PANEL
ALT: 16 U/L (ref 0–44)
AST: 23 U/L (ref 15–41)
Albumin: 4.5 g/dL (ref 3.5–5.0)
Alkaline Phosphatase: 41 U/L (ref 38–126)
Anion gap: 11 (ref 5–15)
BUN: 11 mg/dL (ref 6–20)
CO2: 24 mmol/L (ref 22–32)
Calcium: 9.6 mg/dL (ref 8.9–10.3)
Chloride: 103 mmol/L (ref 98–111)
Creatinine, Ser: 0.9 mg/dL (ref 0.44–1.00)
GFR, Estimated: >60 mL/min (ref >60)
Glucose, Bld: 99 mg/dL (ref 70–99)
Potassium: 3.9 mmol/L (ref 3.5–5.1)
Sodium: 138 mmol/L (ref 135–145)
Total Bilirubin: 0.9 mg/dL (ref 0.3–1.2)
Total Protein: 8.2 g/dL — ABNORMAL HIGH (ref 6.5–8.1)

## 2021-10-20 LAB — LIPID PANEL
Cholesterol: 180 mg/dL (ref 0–200)
HDL: 71 mg/dL (ref >40)
LDL Cholesterol: 106 mg/dL — ABNORMAL HIGH (ref 0–99)
Total CHOL/HDL Ratio: 2.5 RATIO
Triglycerides: 14 mg/dL (ref <150)
VLDL: 3 mg/dL (ref 0–40)

## 2021-10-20 LAB — VALPROIC ACID LEVEL: Valproic Acid Lvl: <10 ug/mL — ABNORMAL LOW (ref 50.0–100.0)

## 2021-10-20 LAB — CBC WITH DIFFERENTIAL/PLATELET
Abs Immature Granulocytes: 0.02 10*3/uL (ref 0.00–0.07)
Basophils Absolute: 0 10*3/uL (ref 0.0–0.1)
Basophils Relative: 0 %
Eosinophils Absolute: 0 10*3/uL (ref 0.0–0.5)
Eosinophils Relative: 0 %
HCT: 34 % — ABNORMAL LOW (ref 36.0–46.0)
Hemoglobin: 11.7 g/dL — ABNORMAL LOW (ref 12.0–15.0)
Immature Granulocytes: 0 %
Lymphocytes Relative: 25 %
Lymphs Abs: 1.5 10*3/uL (ref 0.7–4.0)
MCH: 32.1 pg (ref 26.0–34.0)
MCHC: 34.4 g/dL (ref 30.0–36.0)
MCV: 93.2 fL (ref 80.0–100.0)
Monocytes Absolute: 0.5 10*3/uL (ref 0.1–1.0)
Monocytes Relative: 8 %
Neutro Abs: 4 10*3/uL (ref 1.7–7.7)
Neutrophils Relative %: 67 %
Platelets: 241 10*3/uL (ref 150–400)
RBC: 3.65 MIL/uL — ABNORMAL LOW (ref 3.87–5.11)
RDW: 13.5 % (ref 11.5–15.5)
WBC: 6 10*3/uL (ref 4.0–10.5)
nRBC: 0 % (ref 0.0–0.2)

## 2021-10-20 LAB — RESP PANEL BY RT-PCR (FLU A&B, COVID) ARPGX2
Influenza A by PCR: NEGATIVE
Influenza B by PCR: NEGATIVE
SARS Coronavirus 2 by RT PCR: NEGATIVE

## 2021-10-20 LAB — LITHIUM LEVEL: Lithium Lvl: <0.06 mmol/L — ABNORMAL LOW (ref 0.60–1.20)

## 2021-10-20 LAB — MAGNESIUM: Magnesium: 2 mg/dL (ref 1.7–2.4)

## 2021-10-20 LAB — POC SARS CORONAVIRUS 2 AG: SARSCOV2ONAVIRUS 2 AG: NEGATIVE

## 2021-10-20 LAB — ETHANOL: Alcohol, Ethyl (B): <10 mg/dL (ref <10)

## 2021-10-20 LAB — HEMOGLOBIN A1C
Hgb A1c MFr Bld: 5.2 % (ref 4.8–5.6)
Mean Plasma Glucose: 102.54 mg/dL

## 2021-10-20 LAB — TSH: TSH: 1.121 u[IU]/mL (ref 0.350–4.500)

## 2021-10-20 MED ORDER — STERILE WATER FOR INJECTION IJ SOLN
INTRAMUSCULAR | Status: AC
  Filled 2021-10-20: qty 10

## 2021-10-20 MED ORDER — ZIPRASIDONE MESYLATE 20 MG IM SOLR
20.0000 mg | INTRAMUSCULAR | Status: AC | PRN
  Administered 2021-10-20 – 2021-10-21 (×2): 20 mg via INTRAMUSCULAR
  Filled 2021-10-20: qty 20

## 2021-10-20 MED ORDER — MAGNESIUM HYDROXIDE 400 MG/5ML PO SUSP: 30.0000 mL | Freq: Every day | ORAL | Status: DC | PRN

## 2021-10-20 MED ORDER — LITHIUM CARBONATE ER 300 MG PO TBCR
300.0000 mg | EXTENDED_RELEASE_TABLET | Freq: Two times a day (BID) | ORAL | Status: DC
  Administered 2021-10-21 – 2021-10-22 (×4): 300 mg via ORAL
  Filled 2021-10-20 (×5): qty 1

## 2021-10-20 MED ORDER — OLANZAPINE 5 MG PO TBDP: 5.0000 mg | ORAL_TABLET | Freq: Two times a day (BID) | ORAL | Status: DC

## 2021-10-20 MED ORDER — OLANZAPINE 10 MG PO TBDP
10.0000 mg | ORAL_TABLET | Freq: Once | ORAL | Status: AC
  Administered 2021-10-20: 10 mg via ORAL

## 2021-10-20 MED ORDER — OLANZAPINE 10 MG PO TBDP
10.0000 mg | ORAL_TABLET | Freq: Once | ORAL | Status: AC
  Filled 2021-10-20: qty 1

## 2021-10-20 MED ORDER — ACETAMINOPHEN 325 MG PO TABS: 650.0000 mg | ORAL_TABLET | Freq: Four times a day (QID) | ORAL | Status: DC | PRN

## 2021-10-20 MED ORDER — METFORMIN HCL 500 MG PO TABS
500.0000 mg | ORAL_TABLET | Freq: Two times a day (BID) | ORAL | Status: DC
  Administered 2021-10-21 – 2021-10-22 (×2): 500 mg via ORAL
  Filled 2021-10-20 (×3): qty 1

## 2021-10-20 MED ORDER — OLANZAPINE 5 MG PO TBDP
5.0000 mg | ORAL_TABLET | Freq: Three times a day (TID) | ORAL | Status: DC | PRN
  Administered 2021-10-22: 5 mg via ORAL
  Filled 2021-10-20: qty 1

## 2021-10-20 MED ORDER — DIVALPROEX SODIUM 500 MG PO DR TAB
500.0000 mg | DELAYED_RELEASE_TABLET | Freq: Two times a day (BID) | ORAL | Status: DC
  Administered 2021-10-20 – 2021-10-22 (×5): 500 mg via ORAL
  Filled 2021-10-20 (×5): qty 1

## 2021-10-20 MED ORDER — LORAZEPAM 1 MG PO TABS
1.0000 mg | ORAL_TABLET | ORAL | Status: AC | PRN
  Administered 2021-10-21: 1 mg via ORAL
  Filled 2021-10-20: qty 1

## 2021-10-20 MED ORDER — HYDROXYZINE HCL 25 MG PO TABS
25.0000 mg | ORAL_TABLET | Freq: Three times a day (TID) | ORAL | Status: DC | PRN
  Administered 2021-10-20: 25 mg via ORAL
  Filled 2021-10-20: qty 1

## 2021-10-20 MED ORDER — BENZTROPINE MESYLATE 1 MG PO TABS
1.0000 mg | ORAL_TABLET | Freq: Two times a day (BID) | ORAL | Status: DC
  Administered 2021-10-21 – 2021-10-22 (×3): 1 mg via ORAL
  Filled 2021-10-20 (×4): qty 1

## 2021-10-20 MED ORDER — ALUM & MAG HYDROXIDE-SIMETH 200-200-20 MG/5ML PO SUSP: 30.0000 mL | ORAL | Status: DC | PRN
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
## 2021-10-20 NOTE — ED Notes (Signed)
Unable to retrieve vitals signs on pt due to manic behavior.
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
## 2021-10-20 NOTE — ED Notes (Signed)
Pt at the nurses desk talking to staff in neologism terms. Pt currently standing up near the TV talking loudly to the other patients. Saying that "the staff is being mean to you, taking a Covid test on you, making you take medicine". Then she began to talk to staff again saying that "she is allowed to work". Pt is not easily directed by staff. Safety maintained and will continue to monitor.

## 2021-10-20 NOTE — ED Notes (Signed)
Patient agitated. Uncooperative. Yelling .   Threw water on RN.

## 2021-10-20 NOTE — ED Triage Notes (Signed)
Patient presented with [employer], non-compliant with psychotropic medications for 4 days, psychotic, manic, tangential speech, disorganized thoughts. Patient appears to be responding to internal stimuli. Unable to assess further due to presentation.

## 2021-10-20 NOTE — BH Assessment (Signed)
Comprehensive Clinical Assessment (CCA) Note  10/20/2021 [name] [name] [id number]  DISPOSITION: [name] [name] recommends an inpatient admission to assist with stabilization. IVC has been initiated and patient will be transferred to [name] [name] due to increased intrusive behaviors exhibited while patient was at [name] [name].    [name] [name] from 10/20/2021 in [name] [name] from 10/19/2021 in [name] [name] [name] from 03/08/2021 in [name] [name] No [name] No [name]      The patient demonstrates the following [name] factors for suicide: Chronic [name] factors for suicide include: N/A. Acute [name] factors for suicide include: N/A. Protective factors for this patient include: coping skills. Considering these factors, the overall suicide [name] at this point appears to be low. Patient is not appropriate for outpatient follow up.   Patient is a 41 year old female that presents this date initially voluntary brought in by [employer] but due to AMS an IVC was initiated on arrival. Patient has a history significant for Schizoaffective Disorder and denies having a current OP provider. Due to patient's AMS and delusions it is unclear when patient last took any psychotropic medications. Patient is unable to recall what medications she is prescribed or who her OP provider is. Patient is observed to be delusional with rapid and pressured speech. Patient is tangential and difficult to obtain patient's history. 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 Patient denies any SA history with UDS pending. Per chart review patient's UDS have been negative in the past. Patient presented last night 10/14 (see note of that date) to [name] with similar symptoms although did not meet inpatient criteria at that time. Patient appears to be responding to internal stimuli at the time of  assessment as evidenced by patient clapping her hands at "things flying in the air that want to eat her." Patient also is observed to be pointing at the wall yelling, "Get that." Patient is unable to answer any further questions or participate in the assessment due to AMS and increasing suspicion of this Probation officer.         [name] [name] evaluated patient this date and writes: Patient presents via Event organiser. Patient called police to home related to reported verbal altercation with roommate/pastor, [name] [name] and other roommates in the home. Patient states "I would like to have [name] [name] arrested."     Patient assessed,  face-to-face,  by nurse practitioner. She is alert and oriented. She is hyper-verbal with rapid and pressured speech. Increased speech volume.  Patient continuously shouting throughout assessment.    She is also hyperactive.  She briefly jumped from chair and assumed a stance of fighting with both fists raised.  She was verbally de escalated and redirected by staff along with standby assistance from security.   [name] appears hyper-religious, tangential and bizarre. She states "I speak in tongues , [employer], Darrick Meigs and seven day adventist."  She reports paranoid ideations surrounding congregation, "they don't like me because I speak in tongues, I rebuke the spirits."   She appears to have paranoid delusions surrounding members of her home.  She speaks of herself in the third person, states "I call that lady that goes by the last name of [name] [name]."  She states "I am not a child molester but I went to jail just to make sure."   She denies suicidal and homicidal ideation.  She denies auditory  and visual hallucination.   She is not sleeping for approx two days. She states "I don't need much sleep, I need to pray, my job is to pray."    She has been diagnosed with schizoaffective disorder, bipolar type, acute psychosis and manic behavior.  She is not compliant with  medications.  She is unable to recall outpatient mental health providers at this time. She is unable to articulate specific medications. She endorses history of multiple inpatient psychiatric hospitalizations, "I just left [facility] a few days ago."  No family mental health history reported.   Patient is oriented x 3. Patient renders limited history due to AMS. Patient's memory is impaired with thoughts disorganized. Patient's mood is suspicious and guarded. Patient is tangential and delusional at the time of assessment. Patient appears to be responding to internal stimuli.   Chief Complaint: No chief complaint on file.  Visit Diagnosis: Schizoaffective Disorder, Bipolar     CCA Screening, Triage and Referral (STR)  Patient Reported Information How did you hear about [place]? Self  What Is the Reason for Your Visit/Call Today? Patient presents this date with AMS associated with mental health disorder.Patient presented with [employer], non-compliant with psychotropic medications for 4 days, psychotic, manic, tangential speech, disorganized thoughts. Patient appears to be responding to internal stimuli. Unable to assess further due to presentation.  How Long Has This Been Causing You Problems? 1 wk - 1 month  What Do You Feel Would Help You the Most Today? Treatment for Depression or other mood problem   Have You Recently Had Any Thoughts About Hurting Yourself? No  Are You Planning to Commit Suicide/Harm Yourself At This time? No   Have you Recently Had Thoughts About Cecilia? No  Are You Planning to Harm Someone at This Time? No  Explanation: No data recorded  Have You Used Any Alcohol or Drugs in the Past 24 Hours? No  How Long Ago Did You Use Drugs or Alcohol? No data recorded What Did You Use and How Much? No data recorded  Do You Currently Have a Therapist/Psychiatrist? No  Name of Therapist/Psychiatrist: Pt was unable to provide the name of her psychiatrist and  counselor.   Have You Been Recently Discharged From Any Office Practice or Programs? No  Explanation of Discharge From Practice/Program: No data recorded    CCA Screening Triage Referral Assessment Type of Contact: Face-to-Face  Telemedicine Service Delivery:   Is this Initial or Reassessment? Initial Assessment  Date Telepsych consult ordered in [facility]:  02/17/21  Time Telepsych consult ordered in [name]:  1417  Location of Assessment: [name]  Provider Location: [name]   Collateral Involvement: None at this time   Does Patient Have a Stage manager Guardian? No  Legal Guardian Contact Information: No data recorded Copy of Legal Guardianship Form: No data recorded Legal Guardian Notified of Arrival: No data recorded Legal Guardian Notified of Pending Discharge: No data recorded If Minor and Not Living with Parent(s), Who has Custody? NA  Is CPS involved or ever been involved? Never  Is APS involved or ever been involved? Never   Patient Determined To Be At [name] for Harm To Self or Others Based on Review of Patient Reported Information or Presenting Complaint? No  Method: No data recorded Availability of Means: No data recorded Intent: No data recorded Notification Required: No data recorded Additional Information for Danger to Others Potential: No data recorded Additional Comments for Danger to Others Potential: No  data recorded Are There Guns or Other Weapons in [place]? No data recorded Types of Guns/Weapons: No data recorded Are These Weapons Safely Secured?                            No data recorded Who Could Verify You Are Able To Have These Secured: No data recorded Do You Have any Outstanding Charges, Pending Court Dates, Parole/Probation? No data recorded Contacted To Inform of [name] of Harm To Self or Others: Other: Comment (NA)    Does Patient Present under Involuntary Commitment? Yes  IVC Papers Initial  File Date: 10/20/21   [place] of Residence: [place]   Patient Currently Receiving the Following Services: Not Receiving Services   Determination of Need: Urgent (48 hours)   Options For Referral: Inpatient Hospitalization     CCA Biopsychosocial Patient Reported Schizophrenia/Schizoaffective Diagnosis in Past: Yes   Strengths: UTA   Mental Health Symptoms Depression:   Difficulty Concentrating; Irritability   Duration of Depressive symptoms:  Duration of Depressive Symptoms: Greater than two weeks   Mania:   Increased Energy; Racing thoughts; Irritability; Change in energy/activity   Anxiety:    Restlessness; Irritability; Tension   Psychosis:   Delusions; Hallucinations   Duration of Psychotic symptoms:  Duration of Psychotic Symptoms: Less than six months   Trauma:   None   Obsessions:   None   Compulsions:   None   Inattention:   None   Hyperactivity/Impulsivity:   Blurts out answers; Always on the go   Oppositional/Defiant Behaviors:   Angry; Argumentative   Emotional Irregularity:   Potentially harmful impulsivity   Other Mood/Personality Symptoms:   NA    Mental Status Exam Appearance and self-care  Stature:   Average   Weight:   Average weight   Clothing:   Neat/clean (Layer clothing.)   Grooming:   Normal   Cosmetic use:   None   Posture/gait:   Tense   Motor activity:   Agitated   Sensorium  Attention:   Inattentive; Confused   Concentration:   Preoccupied; Scattered   Orientation:   Person; Place; Situation   Recall/memory:   Defective in Immediate; Defective in Recent   Affect and Mood  Affect:   Congruent   Mood:   Irritable; Anxious; Angry   Relating  Eye contact:   Normal   Facial expression:   Anxious   Attitude toward examiner:   Irritable; Guarded; Suspicious; Resistant   Thought and Language  Speech flow:  Loud; Pressured; Flight of Ideas   Thought content:   Delusions    Preoccupation:   Ruminations   Hallucinations:   Auditory; Visual   Organization:  No data recorded  [employer] of Knowledge:   Poor   Intelligence:   Average   Abstraction:   Functional   Judgement:   Impaired   Reality Testing:   Distorted   Insight:   Flashes of insight   Decision Making:   Only simple   Social Functioning  Social Maturity:   Impulsive   Social Judgement:   Heedless   Stress  Stressors:   Housing   Coping Ability:   Overwhelmed   Skill Deficits:   Decision making; Communication; Responsibility   Supports:   Church     Religion: Religion/Spirituality Are You A Religious Person?: Yes (UTA) What is Your Religious Affiliation?: Christian How Might This Affect Treatment?: UTA  Leisure/Recreation: Leisure / Recreation Do  You Have Hobbies?:  (UTA)  Exercise/Diet: Exercise/Diet Do You Exercise?:  (UTA) Have You Gained or Lost A Significant Amount of Weight in the Past Six Months?:  (UTA) Do You Follow a Special Diet?:  (UTA) Do You Have Any Trouble Sleeping?:  (UTA)   CCA Employment/Education Employment/Work Situation: Employment / Work Situation Employment Situation: Unemployed ([place]) Patient's Job has Been Impacted by Current Illness: No 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)  Education: Education Is Patient Currently Attending School?: No Last Grade Completed: 12 Did You Attend College?:  (UTA) Did You Have An Individualized Education Program (IIEP):  (UTA) Did You Have Any Difficulty At School?:  (UTA) Patient's Education Has Been Impacted by Current Illness:  (UTA)   CCA Family/Childhood History Family and Relationship History: Family history Marital status: Single Does patient have children?: Yes How many children?: 2 How is patient's relationship with their children?: UTA  Childhood History:  Childhood History By whom was/is the patient raised?:  (UTA) Did patient suffer any  verbal/emotional/physical/sexual abuse as a child?:  (UTA) Did patient suffer from severe childhood neglect?:  (UTA) Has patient ever been sexually abused/assaulted/raped as an adolescent or adult?:  (UTA) Was the patient ever a victim of a crime or a disaster?:  (UTA) Witnessed domestic violence?:  (UTA) Has patient been affected by domestic violence as an adult?:  Special educational needs teacher)  Child/Adolescent Assessment:     CCA Substance Use Alcohol/Drug Use: Alcohol / Drug Use Pain Medications: See MAR Prescriptions: See MAR Over the Counter: See MAR History of alcohol / drug use?: No history of alcohol / drug abuse (Pt denies any active use) Longest period of sobriety (when/how long): UTA Negative Consequences of Use: Personal relationships Withdrawal Symptoms:  (Denies)                         ASAM's:  Six Dimensions of Multidimensional Assessment  Dimension 1:  Acute Intoxication and/or Withdrawal Potential:      Dimension 2:  Biomedical Conditions and Complications:      Dimension 3:  Emotional, Behavioral, or Cognitive Conditions and Complications:     Dimension 4:  Readiness to Change:     Dimension 5:  Relapse, Continued use, or Continued Problem Potential:     Dimension 6:  Recovery/Living Environment:     ASAM Severity Score:    ASAM Recommended Level of Treatment:     Substance use Disorder (SUD)    Recommendations for Services/Supports/Treatments: Recommendations for Services/Supports/Treatments Recommendations For Services/Supports/Treatments:  (TBA)  Discharge Disposition:    DSM5 Diagnoses: Patient Active Problem List   Diagnosis Date Noted   Manic behavior ([place]) 10/09/2020   Psychosis ([place]) 07/13/2019   Labor without complication 41/66/0630   Indication for care in labor or delivery 07/10/2019   Third trimester pregnancy 07/06/2019   [redacted] weeks gestation of pregnancy    AMA (advanced maternal age) multigravida 35+, third trimester 07/04/2019   Supervision  of high [name] pregnancy, antepartum 07/04/2019   No prenatal care in current pregnancy in third trimester 07/04/2019   Obesity in pregnancy 07/04/2019   BMI 30s 07/04/2019   History of cesarean delivery 07/04/2019   Short interval between pregnancies affecting pregnancy in third trimester, antepartum 07/04/2019   Adjustment disorder with mixed disturbance of emotions and conduct 07/11/2017   Acute psychosis ([place]) 12/21/2015   Insomnia    Anxiety state    Overactive bladder    Diabetes mellitus ([place]) 02/08/2015   Schizoaffective disorder,  bipolar type ([place]) 01/28/2015   Non compliance w medication regimen      Referrals to Alternative Service(s): Referred to Alternative Service(s):   Place:   Date:   Time:    Referred to Alternative Service(s):   Place:   Date:   Time:    Referred to Alternative Service(s):   Place:   Date:   Time:    Referred to Alternative Service(s):   Place:   Date:   Time:     Mamie Nick, LCAS

## 2021-10-20 NOTE — Progress Notes (Signed)
Patient report called to Chong Sicilian, RN at [facility] ED. Patient is being transported at this time IVC'd via [employer]. All transfer paperwork provided to [employer]. All belongings returned.

## 2021-10-20 NOTE — ED Notes (Addendum)
Patient disorganized and intrusive. Patient oriented to unit but not as easily redirected. Tangential. Irritable and disruptive to milieu constantly raising her voice and not respecting other patient's space. Patient constantly at nurses station demanding and restless. Patient remains safe on unit and provided with meal. Patient in no current distress and will continue to redirect. Patient received zyprexa and atarax po prn.

## 2021-10-20 NOTE — Progress Notes (Signed)
Per Beatriz Stallion, NP, patient meets criteria for inpatient treatment. There are no available beds at [facility] today. CSW faxed referrals to the following facilities for review:  [address] [place] [address] [phone] --  [place] [address], [place] [address] [phone] --  [place] [address] [place] [address] [phone] --  [place]  Pending - Request Sent N/A [place], [place] [address] [phone] --  [place] [address] [place], [place] [address] [phone] --  [place] [address] [place] [address] [phone] --  [place] [address] [place] [address] [phone] --  [facility]  Pending - Request Sent N/A [place] [place] [address] [phone] --  [place] N/A [address] [place] [address] [phone] --  [facility]  Pending - Request Sent N/A [address] [place] [address] [phone] --  [facility]  Pending - Request Sent N/A [address] [place] [address] [phone] --  [facility] N/A [address] [place] [address] [phone] --   TTS will continue to seek bed placement.  [place], MSW, [facility] Phone: [phone] Disposition/TOC

## 2021-10-20 NOTE — ED Provider Notes (Signed)
[facility] Continuous Assessment Admission H&P  Date: 10/20/21 Patient Name: [name] [name] MRN: [id number] Chief Complaint: No chief complaint on file.     Diagnoses:  Final diagnoses:  Manic behavior ([place])  Schizoaffective disorder, bipolar type ([place])    HPI: Patient presents via Event organiser. Patient called police to home related to reported verbal altercation with roommate/pastor, [employer] and other roommates in the home. Patient states "I would like to have [name] arrested."    Patient assessed,  face-to-face,  by nurse practitioner. She is alert and oriented. She is hyper-verbal with rapid and pressured speech. Increased speech volume.  Patient continuously shouting throughout assessment.   She is also hyperactive.  She briefly jumped from chair and assumed a stance of fighting with both fists raised.  She was verbally de escalated and redirected by staff along with standby assistance from security.  Danniell appears hyper-religious, tangential and bizarre. She states "I speak in tongues , [employer], Darrick Meigs and seven day adventist."  She reports paranoid ideations surrounding congregation, "they don't like me because I speak in tongues, I rebuke the spirits."  She appears to have paranoid delusions surrounding members of her home.  She speaks of herself in the third person, states "I call that lady that goes by the last name of Lovena Le."  She states "I am not a child molester but I went to jail just to make sure."  She denies suicidal and homicidal ideation.  She denies auditory and visual hallucination.  She is not sleeping for approx two days. She states "I don't need much sleep, I need to pray, my job is to pray."   She has been diagnosed with schizoaffective disorder, bipolar type, acute psychosis and manic behavior.  She is not compliant with medications.  She is unable to recall outpatient mental health providers at this time. She is unable to  articulate specific medications. She endorses history of multiple inpatient psychiatric hospitalizations, "I just left [facility] a few days ago."  No family mental health history reported.  Patient offered support and encouragement.   She gives verbal consent to speak with her roommate, [name] [phone].  PHQ 2-9:   [place] ED from 10/19/2021 in [facility] ED from 03/08/2021 in [facility] ED from 02/17/2021 in [place] No Risk No Risk No Risk        Total Time spent with patient: 45 minutes  Musculoskeletal  Strength & Muscle Tone: within normal limits Gait & Station: normal Patient leans: N/A  Psychiatric Specialty Exam  Presentation General Appearance:  Bizarre  Eye Contact: Fair  Speech: Pressured  Speech Volume: Increased  Handedness: Right   Mood and Affect  Mood: Anxious; Labile  Affect: Labile; Inappropriate   Thought Process  Thought Processes: Disorganized  Descriptions of Associations:Tangential  Orientation:Full (Time, Place and Person)  Thought Content:Tangential  Diagnosis of Schizophrenia or Schizoaffective disorder in past: No data recorded Duration of Psychotic Symptoms: No data recorded Hallucinations:Hallucinations: None  Ideas of Reference:None  Suicidal Thoughts:Suicidal Thoughts: No  Homicidal Thoughts:Homicidal Thoughts: No   Sensorium  Memory: Immediate Fair  Judgment: Impaired  Insight: Lacking   Executive Functions  Concentration: Poor  Attention Span: Poor  Recall: Poor  Fund of Knowledge: Fair  Language: Fair   Psychomotor Activity  Psychomotor Activity: Psychomotor Activity: Normal   Assets  Assets: Armed forces logistics/support/administrative officer; Housing; Leisure Time; Physical Health; Resilience; Social Support  Sleep  Sleep: Sleep: Fair   Nutritional Assessment (For OBS and  FBC admissions only) Has the patient had a weight loss or gain of 10 pounds or more in the last 3 months?: No Has the patient had a decrease in food intake/or appetite?: No Does the patient have dental problems?: No Does the patient have eating habits or behaviors that may be indicators of an eating disorder including binging or inducing vomiting?: No Has the patient recently lost weight without trying?: 0 Has the patient been eating poorly because of a decreased appetite?: 0 Malnutrition Screening Tool Score: 0    Physical Exam Vitals and nursing note reviewed.  Constitutional:      Appearance: Normal appearance. She is well-developed.  HENT:     Head: Normocephalic and atraumatic.     Nose: Nose normal.  Cardiovascular:     Rate and Rhythm: Normal rate.  Pulmonary:     Effort: Pulmonary effort is normal.  Musculoskeletal:        General: Normal range of motion.     Cervical back: Normal range of motion.  Skin:    General: Skin is warm and dry.  Neurological:     Mental Status: She is alert and oriented to person, place, and time.  Psychiatric:        Mood and Affect: Mood is anxious. Affect is labile.        Speech: Speech is rapid and pressured and tangential.        Behavior: Behavior is aggressive and hyperactive.        Thought Content: Thought content is paranoid and delusional.        Cognition and Memory: Cognition is impaired.        Judgment: Judgment is inappropriate.   Review of Systems  Constitutional: Negative.   HENT: Negative.    Eyes: Negative.   Respiratory: Negative.    Cardiovascular: Negative.   Gastrointestinal: Negative.   Genitourinary: Negative.   Musculoskeletal: Negative.   Skin: Negative.   Neurological: Negative.   Psychiatric/Behavioral:  The patient is nervous/anxious and has insomnia.     unknown if currently breastfeeding. There is no height or weight on file to calculate BMI.  Past Psychiatric History: Schizoaffective disorder,  bipolar type, psychosis, manic behavior  Is the patient at risk to self? No  Has the patient been a risk to self in the past 6 months? No .    Has the patient been a risk to self within the distant past? No   Is the patient a risk to others? No   Has the patient been a risk to others in the past 6 months? No   Has the patient been a risk to others within the distant past? No   Past Medical History:  Past Medical History:  Diagnosis Date   Bipolar affective disorder, currently manic, mild ([place])    Diabetes mellitus without complication ([place])    Schizophrenia ([place])     Past Surgical History:  Procedure Laterality Date   CESAREAN SECTION  04/2018   WISDOM TOOTH EXTRACTION      Family History:  Family History  Problem Relation Age of Onset   Drug abuse Maternal Uncle     Social History:  Social History   Socioeconomic History   Marital status: Legally Separated    Spouse name: Not on file   Number of children: Not on file   Years of education: Not on file   Highest education level: Not on  file  Occupational History   Not on file  Tobacco Use   Smoking status: Never   Smokeless tobacco: Never  Vaping Use   Vaping Use: Never used  Substance and Sexual Activity   Alcohol use: Not Currently   Drug use: Not Currently   Sexual activity: Not Currently  Other Topics Concern   Not on file  Social History Narrative   ** Merged History Encounter **       ** Merged History Encounter **       Social Determinants of Health   Financial Resource Strain: Not on file  Food Insecurity: Not on file  Transportation Needs: Not on file  Physical Activity: Not on file  Stress: Not on file  Social Connections: Not on file  Intimate Partner Violence: Not on file    SDOH:  SDOH Screenings   Alcohol Screen: Low Risk  (07/05/2019)  Tobacco Use: Low Risk  (12/03/2020)    Last Labs:  No visits with results within 6 Month(s) from this visit.  Latest known visit with results is:   Admission on 03/08/2021, Discharged on 03/09/2021  Component Date Value Ref Range Status   SARS Coronavirus 2 by RT PCR 03/08/2021 NEGATIVE  NEGATIVE Final   Comment: (NOTE) SARS-CoV-2 target nucleic acids are NOT DETECTED.  The SARS-CoV-2 RNA is generally detectable in upper respiratory specimens during the acute phase of infection. The lowest concentration of SARS-CoV-2 viral copies this assay can detect is 138 copies/mL. A negative result does not preclude SARS-Cov-2 infection and should not be used as the sole basis for treatment or other patient management decisions. A negative result may occur with  improper specimen collection/handling, submission of specimen other than nasopharyngeal swab, presence of viral mutation(s) within the areas targeted by this assay, and inadequate number of viral copies(<138 copies/mL). A negative result must be combined with clinical observations, patient history, and epidemiological information. The expected result is Negative.  Fact Sheet for Patients:  EntrepreneurPulse.com.au  Fact Sheet for Healthcare Providers:  IncredibleEmployment.be  This test is no                          t yet approved or cleared by the [place] FDA and  has been authorized for detection and/or diagnosis of SARS-CoV-2 by FDA under an Emergency Use Authorization (EUA). This EUA will remain  in effect (meaning this test can be used) for the duration of the COVID-19 declaration under Section 564(b)(1) of the Act, 21 U.S.C.section 360bbb-3(b)(1), unless the authorization is terminated  or revoked sooner.       Influenza A by PCR 03/08/2021 NEGATIVE  NEGATIVE Final   Influenza B by PCR 03/08/2021 NEGATIVE  NEGATIVE Final   Comment: (NOTE) The Xpert Xpress SARS-CoV-2/FLU/RSV plus assay is intended as an aid in the diagnosis of influenza from Nasopharyngeal swab specimens and should not be used as a sole basis for treatment.  Nasal washings and aspirates are unacceptable for Xpert Xpress SARS-CoV-2/FLU/RSV testing.  Fact Sheet for Patients: EntrepreneurPulse.com.au  Fact Sheet for Healthcare Providers: IncredibleEmployment.be  This test is not yet approved or cleared by the [place] FDA and has been authorized for detection and/or diagnosis of SARS-CoV-2 by FDA under an Emergency Use Authorization (EUA). This EUA will remain in effect (meaning this test can be used) for the duration of the COVID-19 declaration under Section 564(b)(1) of the Act, 21 U.S.C. section 360bbb-3(b)(1), unless the authorization is terminated or revoked.  Performed  at [facility], [place] [address] [place], [place] [address]    WBC 03/08/2021 6.9  4.0 - 10.5 K/uL Final   RBC 03/08/2021 3.59 (L)  3.87 - 5.11 MIL/uL Final   Hemoglobin 03/08/2021 11.7 (L)  12.0 - 15.0 g/dL Final   HCT 03/08/2021 33.8 (L)  36.0 - 46.0 % Final   MCV 03/08/2021 94.2  80.0 - 100.0 fL Final   MCH 03/08/2021 32.6  26.0 - 34.0 pg Final   MCHC 03/08/2021 34.6  30.0 - 36.0 g/dL Final   RDW 03/08/2021 13.6  11.5 - 15.5 % Final   Platelets 03/08/2021 355  150 - 400 K/uL Final   nRBC 03/08/2021 0.0  0.0 - 0.2 % Final   Neutrophils Relative % 03/08/2021 62  % Final   Neutro Abs 03/08/2021 4.2  1.7 - 7.7 K/uL Final   Lymphocytes Relative 03/08/2021 28  % Final   Lymphs Abs 03/08/2021 1.9  0.7 - 4.0 K/uL Final   Monocytes Relative 03/08/2021 9  % Final   Monocytes Absolute 03/08/2021 0.6  0.1 - 1.0 K/uL Final   Eosinophils Relative 03/08/2021 1  % Final   Eosinophils Absolute 03/08/2021 0.1  0.0 - 0.5 K/uL Final   Basophils Relative 03/08/2021 0  % Final   Basophils Absolute 03/08/2021 0.0  0.0 - 0.1 K/uL Final   Immature Granulocytes 03/08/2021 0  % Final   Abs Immature Granulocytes 03/08/2021 0.03  0.00 - 0.07 K/uL Final   Performed at [facility], [place] [address] [place], [place] [address]   Sodium  03/08/2021 139  135 - 145 mmol/L Final   Potassium 03/08/2021 3.4 (L)  3.5 - 5.1 mmol/L Final   Chloride 03/08/2021 106  98 - 111 mmol/L Final   CO2 03/08/2021 23  22 - 32 mmol/L Final   Glucose, Bld 03/08/2021 125 (H)  70 - 99 mg/dL Final   Glucose reference range applies only to samples taken after fasting for at least 8 hours.   BUN 03/08/2021 11  6 - 20 mg/dL Final   Creatinine, Ser 03/08/2021 0.83  0.44 - 1.00 mg/dL Final   Calcium 03/08/2021 9.2  8.9 - 10.3 mg/dL Final   Total Protein 03/08/2021 7.9  6.5 - 8.1 g/dL Final   Albumin 03/08/2021 3.9  3.5 - 5.0 g/dL Final   AST 03/08/2021 25  15 - 41 U/L Final   ALT 03/08/2021 21  0 - 44 U/L Final   Alkaline Phosphatase 03/08/2021 53  38 - 126 U/L Final   Total Bilirubin 03/08/2021 0.3  0.3 - 1.2 mg/dL Final   GFR, Estimated 03/08/2021 >60  >60 mL/min Final   Comment: (NOTE) Calculated using the CKD-EPI Creatinine Equation (2021)    Anion gap 03/08/2021 10  5 - 15 Final   Performed at [facility] [address], [name], [place] [address]   Hgb A1c MFr Bld 03/08/2021 5.4  4.8 - 5.6 % Final   Comment: (NOTE) Pre diabetes:          5.7%-6.4%  Diabetes:              >6.4%  Glycemic control for   <7.0% adults with diabetes    Mean Plasma Glucose 03/08/2021 108.28  mg/dL Final   Performed at [facility], [place] [address], [place], [place] [address]   Cholesterol 03/08/2021 160  0 - 200 mg/dL Final   Triglycerides 03/08/2021 25  <150 mg/dL Final  HDL 03/08/2021 52  >40 mg/dL Final   Total CHOL/HDL Ratio 03/08/2021 3.1  RATIO Final   VLDL 03/08/2021 5  0 - 40 mg/dL Final   LDL Cholesterol 03/08/2021 103 (H)  0 - 99 mg/dL Final   Comment:        Total Cholesterol/HDL:CHD Risk Coronary Heart Disease Risk Table                     Men   Women  1/2 Average Risk   3.4   3.3  Average Risk       5.0   4.4  2 X Average Risk   9.6   7.1  3 X Average Risk  23.4   11.0        Use the calculated Patient Ratio above and the  CHD Risk Table to determine the patient's CHD Risk.        ATP III CLASSIFICATION (LDL):  <100     mg/dL   Optimal  100-129  mg/dL   Near or Above                    Optimal  130-159  mg/dL   Borderline  160-189  mg/dL   High  >190     mg/dL   Very High Performed at [facility] [address], [place], [place] [address]    POC Amphetamine UR 03/09/2021 None Detected  NONE DETECTED (Cut Off Level 1000 ng/mL) Final   POC Secobarbital (BAR) 03/09/2021 None Detected  NONE DETECTED (Cut Off Level 300 ng/mL) Final   POC Buprenorphine (BUP) 03/09/2021 None Detected  NONE DETECTED (Cut Off Level 10 ng/mL) Final   POC Oxazepam (BZO) 03/09/2021 Positive (A)  NONE DETECTED (Cut Off Level 300 ng/mL) Final   POC Cocaine UR 03/09/2021 None Detected  NONE DETECTED (Cut Off Level 300 ng/mL) Final   POC Methamphetamine UR 03/09/2021 None Detected  NONE DETECTED (Cut Off Level 1000 ng/mL) Final   POC Morphine 03/09/2021 None Detected  NONE DETECTED (Cut Off Level 300 ng/mL) Final   POC Oxycodone UR 03/09/2021 None Detected  NONE DETECTED (Cut Off Level 100 ng/mL) Final   POC Methadone UR 03/09/2021 None Detected  NONE DETECTED (Cut Off Level 300 ng/mL) Final   POC Marijuana UR 03/09/2021 None Detected  NONE DETECTED (Cut Off Level 50 ng/mL) Final   SARSCOV2ONAVIRUS 2 AG 03/09/2021 NEGATIVE  NEGATIVE Final   Comment: (NOTE) SARS-CoV-2 antigen NOT DETECTED.   Negative results are presumptive.  Negative results do not preclude SARS-CoV-2 infection and should not be used as the sole basis for treatment or other patient management decisions, including infection  control decisions, particularly in the presence of clinical signs and  symptoms consistent with COVID-19, or in those who have been in contact with the virus.  Negative results must be combined with clinical observations, patient history, and epidemiological information. The expected result is Negative.  Fact Sheet for Patients:  HandmadeRecipes.com.cy  Fact Sheet for Healthcare Providers: FuneralLife.at  This test is not yet approved or cleared by the [place] FDA and  has been authorized for detection and/or diagnosis of SARS-CoV-2 by FDA under an Emergency Use Authorization (EUA).  This EUA will remain in effect (meaning this test can be used) for the duration of  the COV                          ID-19 declaration  under Section 564(b)(1) of the Act, 21 U.S.C. section 360bbb-3(b)(1), unless the authorization is terminated or revoked sooner.     Valproic Acid Lvl 03/08/2021 <10 (L)  50.0 - 100.0 ug/mL Final   Comment: RESULTS CONFIRMED BY MANUAL DILUTION Performed at [facility], [place] [address] [place], [place] [address]    Preg Test, Ur 03/09/2021 NEGATIVE  NEGATIVE Final   Comment:        THE SENSITIVITY OF THIS METHODOLOGY IS >24 mIU/mL     Allergies: Quetiapine, Gabapentin, Lorazepam, Risperidone, Trazodone and nefazodone, Valproic acid, Aripiprazole, Fluphenazine, Haloperidol, Trazodone, and Ziprasidone hcl  PTA Medications: (Not in a hospital admission)   Medical Decision Making  Patient reviewed with Dr. Hampton Abbot.  Patient placed under involuntary commitment by this Probation officer.  Inpatient psychiatric hospitalization recommended.  Laboratory studies ordered including CBC, CMP, ethanol, A1c,  lipid panel, magnesium, prolactin, TSH, valproic acid level and lithium level.  Urine pregnancy, urine drug screen ordered.  EKG order initiated.  Current medications: -Acetaminophen 650 mg every 6 as needed/mild pain -Maalox 30 mL oral every 4 as needed/digestion -Hydroxyzine 25 mg 3 times daily as needed/anxiety -Magnesium hydroxide 30 mL daily as needed/mild constipation  Restarted home medication: -olanzapine '5mg'$  BID -labs not yet resulted for valproic acid and lithium levels  Agitation protocol initiated including: -Olanzapine Zydis 10  mg every 8 hours as needed/agitation  Patient accepted to [facility] by Dr Nechama Guard for medical clearance related to EKG changes. She is not appropriate to return to [facility] related to intrusive behaviors toward peers in observation area.     Recommendations  Based on my evaluation the patient appears to have an emergency medical condition for which I recommend the patient be transferred to the emergency department for further evaluation.  Lucky Rathke, FNP 10/20/21  9:17 AM

## 2021-10-20 NOTE — Progress Notes (Signed)
Per Beatriz Stallion, NP, patient meets criteria for inpatient treatment. There are no available beds at [facility] today. CSW re-faxed referrals to the following facilities for review:  [facility] [address], [place] [address] [phone] --  [facility] [address], [place] [address] [phone] --  [facility] [address] [place] [address] [phone] --  [address] [place] [address] [phone] --  [facility]  Pending - Request Sent N/A [place], [place] [address] [phone] --  [facility]  Pending - Request Sent N/A [address] [place] [address] [facility] [address] [place] [address] [phone] --  [facility] [address] [place] [address] [phone] --  [facility] [address] [place] [address] [phone] --  [facility] [place], [place] [address] [phone] --  [facility] [address], [place] [address] [phone] --  [facility] [address], [place] [address] [phone] --  [facility] [address] [place] [address] [phone] --   TTS will continue to seek bed placement.  Glennie [place], MSW,  [facility] Phone: [phone] Disposition/TOC

## 2021-10-21 LAB — CBG MONITORING, ED: Glucose-Capillary: 148 mg/dL — ABNORMAL HIGH (ref 70–99)

## 2021-10-21 LAB — WET PREP, GENITAL
Clue Cells Wet Prep HPF POC: NONE SEEN
Sperm: NONE SEEN
Trich, Wet Prep: NONE SEEN
WBC, Wet Prep HPF POC: <10 (ref <10)
Yeast Wet Prep HPF POC: NONE SEEN

## 2021-10-21 LAB — URINALYSIS, ROUTINE W REFLEX MICROSCOPIC
Bilirubin Urine: NEGATIVE
Glucose, UA: NEGATIVE mg/dL
Hgb urine dipstick: NEGATIVE
Ketones, ur: NEGATIVE mg/dL
Leukocytes,Ua: NEGATIVE
Nitrite: NEGATIVE
Protein, ur: NEGATIVE mg/dL
Specific Gravity, Urine: 1.004 — ABNORMAL LOW (ref 1.005–1.030)
pH: 6 (ref 5.0–8.0)

## 2021-10-21 LAB — HIV ANTIBODY (ROUTINE TESTING W REFLEX): HIV Screen 4th Generation wRfx: NONREACTIVE

## 2021-10-21 LAB — PREGNANCY, URINE: Preg Test, Ur: NEGATIVE

## 2021-10-21 MED ORDER — LIP MEDEX EX OINT
TOPICAL_OINTMENT | CUTANEOUS | Status: DC | PRN
  Filled 2021-10-21: qty 7

## 2021-10-21 MED ORDER — OLANZAPINE 10 MG PO TABS
10.0000 mg | ORAL_TABLET | Freq: Once | ORAL | Status: AC
  Administered 2021-10-21: 10 mg via ORAL
  Filled 2021-10-21: qty 1

## 2021-10-21 MED ORDER — ZIPRASIDONE MESYLATE 20 MG IM SOLR
INTRAMUSCULAR | Status: AC
  Filled 2021-10-21: qty 20

## 2021-10-21 MED ORDER — STERILE WATER FOR INJECTION IJ SOLN
INTRAMUSCULAR | Status: AC
  Administered 2021-10-21: 1.2 mL via INTRAMUSCULAR
  Filled 2021-10-21: qty 10

## 2021-10-21 MED ORDER — ZIPRASIDONE MESYLATE 20 MG IM SOLR: 20.0000 mg | Freq: Once | INTRAMUSCULAR | Status: DC

## 2021-10-21 MED ORDER — ZOLPIDEM TARTRATE 5 MG PO TABS
5.0000 mg | ORAL_TABLET | Freq: Every evening | ORAL | Status: DC | PRN
  Administered 2021-10-21: 5 mg via ORAL
  Filled 2021-10-21: qty 1

## 2021-10-21 NOTE — ED Notes (Signed)
Pt currently resting calmly in bed.

## 2021-10-21 NOTE — ED Notes (Signed)
Pt continues to ask to speak with social work regarding finding a way home and access to somewhere to wash her clothes. Pt redirected back to room again and again. Pt refusing PRN Zyprexa at this time. Pt redirectable and is not verbally or physically aggressive toward staff at this time.

## 2021-10-21 NOTE — ED Notes (Signed)
Pt refuses blood draw for istat hcg at this time.

## 2021-10-21 NOTE — ED Provider Notes (Signed)
Patient is IVC.  Patient seems to be lessening manic behavior.  Patient is wanting to be checked for yeast infection wanting a pregnancy test.  We will go ahead and have her self swab with wet prep and urine sent for GC chlamydia.  She also wanted HIV testing so that has been ordered.  Nurse requesting a higher dose of Zyprexa will give 1 dose of 10 mg.  Max dose is 20 mg/day patient currently getting 5 mg every 8 hours as poor behavior health.  Also ordered Geodon 20 mg IM if needed so that the nurse can give it.  This would be a one-time dose.   Fredia Sorrow, MD 10/21/21 2040

## 2021-10-21 NOTE — ED Provider Notes (Signed)
Emergency Medicine Observation Re-evaluation Note  [name] [name] is a 41 y.o. female, seen on rounds today.  Pt initially presented to the ED for complaints of Manic Behavior Currently, intermittently leaving room and yelling at staff  Physical Exam  BP 132/76 (BP Location: Right Arm)   Pulse (!) 124   Temp 98.2 F (36.8 C) (Oral)   Resp 20   SpO2 97%  Physical Exam Vitals and nursing note reviewed.  Constitutional:      General: She is not in acute distress.    Appearance: She is well-developed.  HENT:     Head: Normocephalic and atraumatic.  Eyes:     Conjunctiva/sclera: Conjunctivae normal.  Cardiovascular:     Rate and Rhythm: Normal rate and regular rhythm.     Heart sounds: No murmur heard. Pulmonary:     Effort: Pulmonary effort is normal. No respiratory distress.  Musculoskeletal:        General: No swelling.     Cervical back: Neck supple.  Skin:    General: Skin is warm and dry.     Capillary Refill: Capillary refill takes less than 2 seconds.  Neurological:     Mental Status: She is alert.  Psychiatric:        Mood and Affect: Mood normal.      ED Course / MDM  EKG:   I have reviewed the labs performed to date as well as medications administered while in observation.  Recent changes in the last 24 hours include TTS evaluation and recommendations for inpatient treatment.  Plan  Current plan is for inpatient psychiatric placement.    Teressa Lower, MD 10/21/21 1339

## 2021-10-21 NOTE — ED Notes (Signed)
Pt coming out to nursing station, rambling about various subjects. Tangential speech. Pt states, "I'm going to drive back to [place] and but I need to go to church but [employer] listening to God and they have a hot Media planner. I have a court hearing on October 23rd and I need to go to that but I have to walk 3 hours to get there so maybe the doctor will say I can't go and then they will have to excuse me." Pt redirectable, back to room without incident at this time.

## 2021-10-21 NOTE — ED Notes (Signed)
Pt has been sleeping starting at 0215.

## 2021-10-21 NOTE — ED Notes (Signed)
Pt woke up at 0100 and washed her face. Pt became very manic. Pt was agitated moving around the room. Pt reported that she was a prophet sent by god. Pt reports that [employer] wrote Leandro Reasoner based on her life. Pt reports that she is hungry and thirsty because they keep making her dance and sing for them. Pt reports that she needs to leave to make it to court. Pt was medicated per [facility] , provider called to bedside. Pt given food and drinks.

## 2021-10-21 NOTE — ED Notes (Signed)
Pt repetitively out of room, at nursing station, raising voice at staff. Pt asking questions about lawyers, discharge, requesting to see a chaplain, asking to call "spouse" whose number she does not know and is not listed in chart. Pt behavior redirectable, but labile. Pt requiring redirection every few minutes.

## 2021-10-21 NOTE — ED Notes (Signed)
Patient requested ambien for sleep. Notified Dr. Rogene Houston. New order for [facility] ordered and given. Will continue to monitor.

## 2021-10-21 NOTE — ED Notes (Signed)
Pt out of room again, requesting to use the phone to call her lawyer. Pt then began to escalate, yelling at staff, stating, "You guys steal all my stuff! You just take and take and take! I had a red rolleraid dildo and it never did get back to me."

## 2021-10-22 ENCOUNTER — Other Ambulatory Visit: Payer: Self-pay | Admitting: Psychiatry

## 2021-10-22 ENCOUNTER — Inpatient Hospital Stay (HOSPITAL_COMMUNITY)
Admission: AD | Admit: 2021-10-22 | Discharge: 2021-11-08 | DRG: 885 | Disposition: A | Discharge status: Home / Self Care | Payer: Medicare Other | Source: Intra-hospital | Attending: Psychiatry | Admitting: Psychiatry

## 2021-10-22 DIAGNOSIS — Z91148 Patient's other noncompliance with medication regimen for other reason: Secondary | ICD-10-CM

## 2021-10-22 DIAGNOSIS — F250 Schizoaffective disorder, bipolar type: Secondary | ICD-10-CM | POA: Diagnosis present

## 2021-10-22 DIAGNOSIS — I10 Essential (primary) hypertension: Secondary | ICD-10-CM | POA: Diagnosis present

## 2021-10-22 DIAGNOSIS — F3010 Manic episode without psychotic symptoms, unspecified: Secondary | ICD-10-CM | POA: Diagnosis present

## 2021-10-22 DIAGNOSIS — K5900 Constipation, unspecified: Secondary | ICD-10-CM | POA: Diagnosis present

## 2021-10-22 DIAGNOSIS — Z803 Family history of malignant neoplasm of breast: Secondary | ICD-10-CM | POA: Diagnosis not present

## 2021-10-22 DIAGNOSIS — Z7982 Long term (current) use of aspirin: Secondary | ICD-10-CM

## 2021-10-22 DIAGNOSIS — Z888 Allergy status to other drugs, medicaments and biological substances status: Secondary | ICD-10-CM | POA: Diagnosis not present

## 2021-10-22 DIAGNOSIS — H409 Unspecified glaucoma: Secondary | ICD-10-CM | POA: Diagnosis present

## 2021-10-22 DIAGNOSIS — E119 Type 2 diabetes mellitus without complications: Secondary | ICD-10-CM | POA: Diagnosis present

## 2021-10-22 DIAGNOSIS — F22 Delusional disorders: Secondary | ICD-10-CM | POA: Diagnosis present

## 2021-10-22 DIAGNOSIS — Z1152 Encounter for screening for COVID-19: Secondary | ICD-10-CM

## 2021-10-22 DIAGNOSIS — Z79899 Other long term (current) drug therapy: Secondary | ICD-10-CM | POA: Diagnosis not present

## 2021-10-22 DIAGNOSIS — F319 Bipolar disorder, unspecified: Secondary | ICD-10-CM | POA: Diagnosis not present

## 2021-10-22 LAB — RPR: RPR Ser Ql: NONREACTIVE

## 2021-10-22 LAB — GC/CHLAMYDIA PROBE AMP (~~LOC~~) NOT AT ARMC
Chlamydia: NEGATIVE
Comment: NEGATIVE
Comment: NORMAL
Neisseria Gonorrhea: NEGATIVE

## 2021-10-22 LAB — SARS CORONAVIRUS 2 BY RT PCR: SARS Coronavirus 2 by RT PCR: NEGATIVE

## 2021-10-22 MED ORDER — ADULT MULTIVITAMIN W/MINERALS CH
1.0000 | ORAL_TABLET | Freq: Once | ORAL | Status: AC
  Administered 2021-10-22: 1 via ORAL
  Filled 2021-10-22: qty 1

## 2021-10-22 MED ORDER — MENTHOL 3 MG MT LOZG: 1.0000 | LOZENGE | OROMUCOSAL | Status: DC | PRN

## 2021-10-22 MED ORDER — MAGNESIUM HYDROXIDE 400 MG/5ML PO SUSP
30.0000 mL | Freq: Every day | ORAL | Status: DC | PRN
  Administered 2021-10-28: 30 mL via ORAL
  Filled 2021-10-22: qty 30

## 2021-10-22 MED ORDER — QUETIAPINE FUMARATE 100 MG PO TABS
ORAL_TABLET | ORAL | Status: AC
  Filled 2021-10-22: qty 1

## 2021-10-22 MED ORDER — OLANZAPINE 10 MG PO TBDP
10.0000 mg | ORAL_TABLET | Freq: Two times a day (BID) | ORAL | Status: DC
  Filled 2021-10-22 (×2): qty 1

## 2021-10-22 MED ORDER — NICOTINE 7 MG/24HR TD PT24: 7.0000 mg | MEDICATED_PATCH | TRANSDERMAL | Status: DC

## 2021-10-22 MED ORDER — METFORMIN HCL ER 500 MG PO TB24
500.0000 mg | ORAL_TABLET | Freq: Two times a day (BID) | ORAL | Status: DC
  Administered 2021-10-22 – 2021-11-08 (×32): 500 mg via ORAL
  Filled 2021-10-22 (×39): qty 1

## 2021-10-22 MED ORDER — LORAZEPAM 1 MG PO TABS
1.0000 mg | ORAL_TABLET | Freq: Four times a day (QID) | ORAL | Status: DC | PRN
  Administered 2021-11-05: 1 mg via ORAL
  Filled 2021-10-22 (×2): qty 1

## 2021-10-22 MED ORDER — QUETIAPINE FUMARATE 200 MG PO TABS
200.0000 mg | ORAL_TABLET | Freq: Every day | ORAL | Status: DC
  Administered 2021-10-22: 200 mg via ORAL
  Filled 2021-10-22 (×2): qty 1

## 2021-10-22 MED ORDER — DOCUSATE SODIUM 100 MG PO CAPS
100.0000 mg | ORAL_CAPSULE | Freq: Once | ORAL | Status: AC
  Administered 2021-10-22: 100 mg via ORAL
  Filled 2021-10-22: qty 1

## 2021-10-22 MED ORDER — OLANZAPINE 10 MG PO TBDP
10.0000 mg | ORAL_TABLET | Freq: Every day | ORAL | Status: DC
  Administered 2021-10-22: 10 mg via ORAL
  Filled 2021-10-22: qty 1

## 2021-10-22 MED ORDER — OLANZAPINE 10 MG PO TBDP: 10.0000 mg | ORAL_TABLET | Freq: Every day | ORAL | Status: DC

## 2021-10-22 MED ORDER — ZIPRASIDONE MESYLATE 20 MG IM SOLR
20.0000 mg | Freq: Four times a day (QID) | INTRAMUSCULAR | Status: DC | PRN
  Administered 2021-10-22: 20 mg via INTRAMUSCULAR
  Filled 2021-10-22: qty 20

## 2021-10-22 MED ORDER — QUETIAPINE FUMARATE 100 MG PO TABS
100.0000 mg | ORAL_TABLET | Freq: Three times a day (TID) | ORAL | Status: DC
  Administered 2021-10-22 – 2021-10-23 (×3): 100 mg via ORAL
  Filled 2021-10-22 (×9): qty 1

## 2021-10-22 MED ORDER — LOSARTAN POTASSIUM 50 MG PO TABS
50.0000 mg | ORAL_TABLET | Freq: Every day | ORAL | Status: DC
  Administered 2021-10-23 – 2021-11-08 (×16): 50 mg via ORAL
  Filled 2021-10-22 (×20): qty 1

## 2021-10-22 MED ORDER — OLANZAPINE 10 MG PO TBDP: 10.0000 mg | ORAL_TABLET | Freq: Three times a day (TID) | ORAL | Status: DC | PRN

## 2021-10-22 MED ORDER — POLYETHYLENE GLYCOL 3350 17 G PO PACK
17.0000 g | PACK | Freq: Every day | ORAL | Status: DC | PRN
  Administered 2021-10-22: 17 g via ORAL
  Filled 2021-10-22: qty 1

## 2021-10-22 MED ORDER — DIVALPROEX SODIUM 500 MG PO DR TAB
500.0000 mg | DELAYED_RELEASE_TABLET | Freq: Two times a day (BID) | ORAL | Status: DC
  Administered 2021-10-22 – 2021-10-25 (×7): 500 mg via ORAL
  Filled 2021-10-22 (×11): qty 1

## 2021-10-22 MED ORDER — QUETIAPINE FUMARATE 100 MG PO TABS
100.0000 mg | ORAL_TABLET | Freq: Every evening | ORAL | Status: DC | PRN
  Administered 2021-10-27: 100 mg via ORAL
  Filled 2021-10-22 (×3): qty 1

## 2021-10-22 MED ORDER — ACETAMINOPHEN 325 MG PO TABS: 650.0000 mg | ORAL_TABLET | Freq: Four times a day (QID) | ORAL | Status: DC | PRN

## 2021-10-22 MED ORDER — ALUM & MAG HYDROXIDE-SIMETH 200-200-20 MG/5ML PO SUSP
30.0000 mL | ORAL | Status: DC | PRN
  Administered 2021-10-22: 30 mL via ORAL
  Filled 2021-10-22: qty 30

## 2021-10-22 MED ORDER — QUETIAPINE FUMARATE 100 MG PO TABS
100.0000 mg | ORAL_TABLET | Freq: Four times a day (QID) | ORAL | Status: DC | PRN
  Filled 2021-10-22: qty 1

## 2021-10-22 MED ORDER — HYDROXYZINE HCL 25 MG PO TABS
25.0000 mg | ORAL_TABLET | Freq: Three times a day (TID) | ORAL | Status: DC | PRN
  Administered 2021-10-22 – 2021-10-24 (×2): 25 mg via ORAL
  Filled 2021-10-22 (×3): qty 1
  Filled 2021-10-22: qty 10

## 2021-10-22 MED ORDER — QUETIAPINE FUMARATE 100 MG PO TABS
100.0000 mg | ORAL_TABLET | Freq: Two times a day (BID) | ORAL | Status: DC
  Administered 2021-10-22: 100 mg via ORAL
  Filled 2021-10-22 (×3): qty 1

## 2021-10-22 NOTE — ED Notes (Addendum)
Patient requested colace, nicotine patch and throat lozenges. Notified Dr. Ralene Bathe. New orders placed in Epic. Will administer and will continue to monitor.   Patient went back to sleep before giving the medications. Will administer when she wakes up.

## 2021-10-22 NOTE — Progress Notes (Signed)
Pt was accepted to [facility] 10/22/21; Bed Assignment 507-1 PENDING 15 min covid and updated VS.  Please send IVC paperwork via fax [phone]   Pt meets inpatient criteria per Oneida Alar, NP  Attending Physician will be Dr. Janine Limbo  Report can be called to: -Adult unit: [phone]  Pt can arrive after: PENDING ITEMS  Care Team notified: [facility] Lynnda Shields, RN, Wilmon Arms, RN, Oneida Alar, NP, Sherwood Gambler, MD  Nadara Mode, [place] 10/22/2021 @ 11:58 AM

## 2021-10-22 NOTE — Progress Notes (Signed)
Inpatient Behavioral Health Placement  Pt meets inpatient criteria. Referral was sent to the following facilities;   Destination Service Provider Address Phone Fax  [facility]  [address] [place] [address] [phone]  [facility]  [address], [place] [address] [phone]  [facility]  [address] [place] [address] [place]  [facility]  [address], [place] [address] [phone]  [facility]  [place], [place] [address] [place]  [facility]  [address] [place] [address] [phone]  [facility]  [address], [place] [address] [phone]  [facility]  [address] [place] [address] [phone]  [facility]  [address], [place] [address] [phone] [facility]  [address] [place] [address] [phone]  [facility]  [address] [place] [address] [phone] [facility]  [address], [place] [address] [phone]  [facility]  [address] [place] [address] [phone]    Situation ongoing,  CSW will follow up.   Benjaman Kindler, MSW, LCSWA 10/22/2021  @ 12:52 AM
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
## 2021-10-22 NOTE — ED Notes (Signed)
Patient woke up and requested a multivitamin and miralax. Notified Dr. Ralene Bathe. New orders in Epic. Will continue to monitor.
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
## 2021-10-22 NOTE — Progress Notes (Signed)
Pt initially does not want meds due to being "allergic". Pt then states "Jesus is telling me to take it" and took the meds with no issues. 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 Provided support and encouragement. Pt safe on the unit. Q 15 minute safety checks continued.    10/22/21 2000  Psych Admission Type (Psych Patients Only)  Admission Status Involuntary  Psychosocial Assessment  Patient Complaints Agitation;Anger;Anxiety;Hyperactivity  Eye Contact Intense  Facial Expression Angry;Anxious  Affect Angry;Apathetic;Irritable  Speech Aggressive;Argumentative;Pressured;Rapid;Loud;[place]aron Chief Technology Officer;Hostile;Dominating;Hypervigilant  Motor Activity Fidgety;Restless  Appearance/Hygiene Disheveled  Behavior Characteristics Agitated;Irritable;Resistant to care  Mood Labile;Suspicious;Angry;Irritable  Thought Horticulturist, commercial of ideas  Content Preoccupation;Paranoia;Religiosity;Blaming others  Delusions Grandeur;Paranoid;Religious;Persecutory  Perception WDL  Hallucination None reported or observed  Judgment Impaired  Confusion Moderate  Danger to Self  Current suicidal ideation? Denies  Danger to Others  Danger to Others Reported or observed  Danger to Others Abnormal  Harmful Behavior to others No threats or harm toward other people (none this shift)

## 2021-10-22 NOTE — Progress Notes (Signed)
   10/22/21 1645  Face to Face Evaluation of Restrictive Event  Face to Face Evaluation within one hour of restrictive event Yes  Reevaluation for continuation of restrictive intervention Yes  Face to Face Sensorium Alert  Face to Face Orientation Disoriented to person  Face to Face Verbal Response Appropriately responds  Face to Face Thought Processes Delusions;Hallucinations  Face to Face Psychomotor Response Normal  Face to Face Mood Calm  Face to Face Affect Labile  Suggestions for helping patient regain control Verbal support/reassurance;Comfort measures  Will patient contract for safety of self and others? Yes  Can patient remain calm? Yes (At present, patient is calm, however she is labile, delusional, and frequently demonstrates behaviors secondary to her psychiatric diagnosis.)  Can patient manage own behavior? Yes (At present, patient is able to manage own behaviors, however she is labile and frequently demonstrates behaviors secondary to her psychiatric diagnosis.)  Description of patient behavior during check Patient is lying in her bed comfortably.  Signs of patient injury No  Description of Physiological Status No alterations in physiological status expressed or observed during assessment.  Description of Psychological Status Patient remains religiously preoccupied and fixated on staff members of the african [place] race.  Any alterations in the criteria for release? No  Pain Assessment  Pain Scale 0-10  Pain Score 0  Evaluator's Recommendations  Patient meets criteria for Release.  Patient had already met release criteria prior to my arrival and has been released.  Face to face completed by RN  Name of MD Ardean Larsen MD  Date MD notified 10/22/21  Time MD Notified 1701  MD Recommendations None

## 2021-10-22 NOTE — ED Notes (Signed)
Patient discharged off unit to [facility] per provider. Patient alert and no s/s of distress at this time. Discharge information and belongings given to [employer] for transport. Patient ambulatory off unit, escorted and transported by [employer].

## 2021-10-22 NOTE — Plan of Care (Signed)
I spoke with patient briefly today who states that she was taking Seroquel recently and does not have any allergy or anaphylaxis to this medication.
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
## 2021-10-22 NOTE — Progress Notes (Signed)
Patient was taken off restraint at 1635. Patient is in the room at this time. Observation on going, staff will continue to provide support to patient.

## 2021-10-22 NOTE — Plan of Care (Signed)
  Problem: Safety: Goal: Violent Restraint(s) Outcome: Not Met (add Reason)   Per admitting RN, patient declined to list anyone on "restrictive procedures patient information acknowledgment form". Unable to notify anyone of restrictive event due to no designated individual listed.

## 2021-10-22 NOTE — Progress Notes (Addendum)
Admission note: Patient is a 41 year old female admitted from [facility] under IVC status due to bizarre behavior. Report received from the nurse at [facility] states patient was aggressive, agitated, flight of ideas, disorganized, uncooperative, hyperverbal, and was given Zyprexa with no relief. Geodone was also administered to to patient at [facility]. Patient arrived at the [facility] unit still exhibiting all of the above symptom but became combative, agitated, verbally abusive, and threatening staff. Patient refused to cooperative in some of the admission process, but complied with some.   No contraband found during skin assessment, Skin, clean-dry- intact without evidence of bruising, or skin tears and tracks marks. Q 15 minutes safety observation initiated, Staff will continue to provide support to patient.

## 2021-10-23 ENCOUNTER — Encounter (HOSPITAL_COMMUNITY): Payer: Self-pay

## 2021-10-23 DIAGNOSIS — F250 Schizoaffective disorder, bipolar type: Secondary | ICD-10-CM

## 2021-10-23 LAB — PROLACTIN: Prolactin: 15.7 ng/mL (ref 4.8–23.3)

## 2021-10-23 MED ORDER — MENTHOL 3 MG MT LOZG
1.0000 | LOZENGE | OROMUCOSAL | Status: DC | PRN
  Administered 2021-10-27 – 2021-10-28 (×3): 3 mg via ORAL
  Filled 2021-10-23 (×3): qty 9

## 2021-10-23 MED ORDER — CHLORPROMAZINE HCL 25 MG/ML IJ SOLN
25.0000 mg | Freq: Four times a day (QID) | INTRAMUSCULAR | Status: DC | PRN
  Administered 2021-10-23: 25 mg via INTRAMUSCULAR
  Filled 2021-10-23: qty 1

## 2021-10-23 MED ORDER — QUETIAPINE FUMARATE 400 MG PO TABS
400.0000 mg | ORAL_TABLET | Freq: Every day | ORAL | Status: DC
  Administered 2021-10-23 – 2021-11-01 (×10): 400 mg via ORAL
  Filled 2021-10-23 (×11): qty 1

## 2021-10-23 MED ORDER — CHLORPROMAZINE HCL 25 MG/ML IJ SOLN
25.0000 mg | Freq: Three times a day (TID) | INTRAMUSCULAR | Status: DC
  Filled 2021-10-23 (×5): qty 1

## 2021-10-23 MED ORDER — CHLORPROMAZINE HCL 25 MG/ML IJ SOLN
25.0000 mg | Freq: Every day | INTRAMUSCULAR | Status: DC
  Filled 2021-10-23 (×9): qty 1

## 2021-10-23 MED ORDER — TEMAZEPAM 15 MG PO CAPS
15.0000 mg | ORAL_CAPSULE | Freq: Every day | ORAL | Status: DC
  Administered 2021-10-23 – 2021-10-29 (×7): 15 mg via ORAL
  Filled 2021-10-23 (×5): qty 1

## 2021-10-23 MED ORDER — QUETIAPINE FUMARATE 100 MG PO TABS
100.0000 mg | ORAL_TABLET | Freq: Three times a day (TID) | ORAL | Status: DC
  Administered 2021-10-23 – 2021-10-24 (×2): 100 mg via ORAL
  Filled 2021-10-23 (×5): qty 1

## 2021-10-23 MED ORDER — QUETIAPINE FUMARATE 400 MG PO TABS
400.0000 mg | ORAL_TABLET | Freq: Every day | ORAL | Status: DC
  Filled 2021-10-23 (×2): qty 1

## 2021-10-23 MED ORDER — TEMAZEPAM 15 MG PO CAPS
15.0000 mg | ORAL_CAPSULE | Freq: Every evening | ORAL | Status: DC | PRN
  Filled 2021-10-23 (×3): qty 1

## 2021-10-23 MED ORDER — LORAZEPAM 2 MG/ML IJ SOLN
1.0000 mg | Freq: Four times a day (QID) | INTRAMUSCULAR | Status: DC | PRN
  Administered 2021-10-23: 1 mg via INTRAMUSCULAR
  Filled 2021-10-23: qty 1

## 2021-10-23 MED ORDER — LORAZEPAM 0.5 MG PO TABS: 0.5000 mg | ORAL_TABLET | Freq: Three times a day (TID) | ORAL | Status: DC

## 2021-10-23 MED ORDER — LORAZEPAM 1 MG PO TABS
1.0000 mg | ORAL_TABLET | Freq: Three times a day (TID) | ORAL | Status: DC
  Administered 2021-10-23 – 2021-10-25 (×6): 1 mg via ORAL
  Filled 2021-10-23 (×6): qty 1

## 2021-10-23 MED ORDER — MENTHOL 3 MG MT LOZG
LOZENGE | OROMUCOSAL | Status: AC
  Administered 2021-10-23: 1 via ORAL
  Filled 2021-10-23: qty 9

## 2021-10-23 MED ORDER — QUETIAPINE FUMARATE 200 MG PO TABS
200.0000 mg | ORAL_TABLET | Freq: Four times a day (QID) | ORAL | Status: DC | PRN
  Administered 2021-10-23: 200 mg via ORAL

## 2021-10-23 NOTE — Group Note (Signed)
Recreation Therapy Group Note   Group Topic:Relaxation  Group Date: 10/23/2021 Start Time: 1000 End Time: 1287 Facilitators: Deray Dawes-McCall, LRT,CTRS Location: [address] Dayroom   Goal Area(s) Addresses:  Patient will successfully identify songs that help them relax. Patient will identify healthy ways to increase relaxation. Patient will acknowledge benefit(s) of relaxation.  Group Description:  Relaxation Music.  Patients were allowed to pick songs that helped them relax and have meaning to them.  LRT played the songs requested by patients.  Patients sang a long with and moved around to the beat of the songs being played during group session.   Affect/Mood: Manic   Participation Level: Hyperverbal   Participation Quality: Independent   Behavior: Hyperverbal   Speech/Thought Process: Delusional   Insight: Lacking   Judgement: Lacking    Modes of Intervention: Music   Patient Response to Interventions:  Engaged   Education Outcome:  Acknowledges education and In group clarification offered    Clinical Observations/Individualized Feedback: Pt was hyper-verbal and hyper-religious during group.  Pt was also delusional in saying she wrote and sung every song that was played.  Pt talked nonstop until she was called out of group.  Pt did not return.    Plan: Continue to engage patient in RT group sessions 2-3x/week.   Shadow Schedler-McCall, LRT,CTRS 10/23/2021 1:08 PM

## 2021-10-23 NOTE — Plan of Care (Signed)
  Problem: Coping: Goal: Coping ability will improve Outcome: Progressing Goal: Will verbalize feelings Outcome: Progressing   Problem: Safety: Goal: Ability to redirect hostility and anger into socially appropriate behaviors will improve Outcome: Progressing Goal: Ability to remain free from injury will improve Outcome: Progressing

## 2021-10-23 NOTE — Progress Notes (Signed)
  Reason for the Medication: The patient, without the benefit of the specific treatment measure, is incapable of participating in any available treatment plan that will give the patient a realistic opportunity of improving the patient's condition.   Consideration of Side Effects: Consideration of the side effects related to the medication plan has been given.   Rationale for Medication Administration: Patient has shown improvement in past hospitalizations with medication management.  She decompensates after discharge when she becomes medication compliant.    -----   At the present time, patient presents manic with increased fast speech, paranoid and disorganized, patient has no capacity to understand the need for treatment, refusing to take psychotropic medication, because of poor insight and judgment.  Patient states that they have no problem, no mental illness and refuses to consider taking medication, recommended by the psychiatrist Dr. Caswell Corwin.  Diagnosis: Schizoaffective disorder, bipolar type   Treatment plan/Opinion: -patient is mentally ill, as needs hospitalization -patient continues to display active symptoms of psychosis, disorganized thought process, paranoia and mania. -patient lacks the capacity to consent to treatment with medication secondary to active paranoia, poor insight and judgement.  -patient is unable to rationally discuss medication options, risks versus benefit due to their symptoms. -treatment with medication and medication changes would be in their best interest -psychotropics are effective treatment for symptoms of psychosis   Based on my evaluation, the following medications are recommend and indicated for treatment of the patient's psychiatric diagnosis and symptoms: Thorazine orally or IM, using first or second generation antipsychotic, due to severity of presentation, patient may need two antipsychotics to stabilize.

## 2021-10-23 NOTE — Progress Notes (Signed)
   10/23/21 0800  Psych Admission Type (Psych Patients Only)  Admission Status Involuntary  Psychosocial Assessment  Patient Complaints Agitation;Anger  Eye Contact Intense  Facial Expression Angry;Anxious  Affect Angry;Apathetic;Apprehensive;Blunted;Euphoric;Irritable;Preoccupied;Threatening  Speech Aggressive;Argumentative;Pressured;Loud;Tangential  Interaction Arrogant;Hypervigilant;Intrusive;Manipulative  Motor Activity Fidgety;Restless  Appearance/Hygiene Disheveled  Behavior Characteristics Agitated;Anxious;Hyperactive;Impulsive;Restless  Aggressive Behavior  Type of Behavior Threatening;Verbal;Provoked or triggered  Thought Horticulturist, commercial of ideas;Tangential  Content Preoccupation;Paranoia  Delusions Grandeur;Paranoid;Persecutory  Perception Derealization  Hallucination Auditory  Judgment Impaired  Confusion Mild  Danger to Self  Current suicidal ideation? Denies  Danger to Others  Danger to Others Reported or observed  Danger to Others Abnormal  Harmful Behavior to others Acts of violence towards other people observed   Description of Harmful Behavior threatning, screaming

## 2021-10-23 NOTE — BH IP Treatment Plan (Signed)
Interdisciplinary Treatment and Diagnostic Plan Update  10/23/2021 Time of Session: [place] MRN: [id number]  Principal Diagnosis: Schizoaffective disorder, bipolar type ([place])  Secondary Diagnoses: Principal Problem:   Schizoaffective disorder, bipolar type ([place])   Current Medications:  Current Facility-Administered Medications  Medication Dose Route Frequency Provider Last Rate Last Admin   acetaminophen (TYLENOL) tablet 650 mg  650 mg Oral Q6H PRN Leevy-Johnson, Brooke A, NP       alum & mag hydroxide-simeth (MAALOX/MYLANTA) 200-200-20 MG/5ML suspension 30 mL  30 mL Oral Q4H PRN Leevy-Johnson, Brooke A, NP   30 mL at 10/22/21 2200   chlorproMAZINE (THORAZINE) injection 25 mg  25 mg Intramuscular Q6H PRN Massengill, Ovid Curd, MD   25 mg at 10/23/21 1301   QUEtiapine (SEROQUEL) tablet 100 mg  100 mg Oral TID Janine Limbo, MD       Or   chlorproMAZINE (THORAZINE) injection 25 mg  25 mg Intramuscular TID Massengill, Ovid Curd, MD       QUEtiapine (SEROQUEL) tablet 400 mg  400 mg Oral QHS Massengill, Nathan, MD       Or   chlorproMAZINE (THORAZINE) injection 25 mg  25 mg Intramuscular QHS Massengill, Ovid Curd, MD       divalproex (DEPAKOTE) DR tablet 500 mg  500 mg Oral Q12H Leevy-Johnson, Brooke A, NP   500 mg at 10/23/21 0646   hydrOXYzine (ATARAX) tablet 25 mg  25 mg Oral TID PRN Janine Limbo, MD   25 mg at 10/22/21 2001   LORazepam (ATIVAN) injection 1 mg  1 mg Intramuscular Q6H PRN Massengill, Ovid Curd, MD   1 mg at 10/23/21 1300   LORazepam (ATIVAN) tablet 1 mg  1 mg Oral Q6H PRN Massengill, Ovid Curd, MD       LORazepam (ATIVAN) tablet 1 mg  1 mg Oral TID Massengill, Ovid Curd, MD       losartan (COZAAR) tablet 50 mg  50 mg Oral Daily Leevy-Johnson, Brooke A, NP   50 mg at 10/23/21 2025   magnesium hydroxide (MILK OF MAGNESIA) suspension 30 mL  30 mL Oral Daily PRN Leevy-Johnson, Brooke A, NP       menthol-cetylpyridinium (CEPACOL) lozenge 3 mg  1 lozenge Oral PRN  Evette Georges, NP   1 lozenge at 10/23/21 0300   metFORMIN (GLUCOPHAGE-XR) 24 hr tablet 500 mg  500 mg Oral BID WC Leevy-Johnson, Brooke A, NP   500 mg at 10/23/21 0651   QUEtiapine (SEROQUEL) tablet 100 mg  100 mg Oral QHS PRN Massengill, Ovid Curd, MD       QUEtiapine (SEROQUEL) tablet 200 mg  200 mg Oral Q6H PRN Massengill, Ovid Curd, MD   200 mg at 10/23/21 0929   temazepam (RESTORIL) capsule 15 mg  15 mg Oral QHS Massengill, Nathan, MD       temazepam (RESTORIL) capsule 15 mg  15 mg Oral QHS PRN Massengill, Ovid Curd, MD       PTA Medications: Medications Prior to Admission  Medication Sig Dispense Refill Last Dose   divalproex (DEPAKOTE) 500 MG DR tablet Take 1 tablet by mouth 2 (two) times daily.   Past Month   losartan (COZAAR) 50 MG tablet Take 1 tablet by mouth daily.   Past Month   metFORMIN (GLUCOPHAGE) 500 MG tablet Take 1 tablet by mouth in the morning and at bedtime.   Past Month   benztropine (COGENTIN) 1 MG tablet Take 1 tablet by mouth 2 (two) times daily.      QUEtiapine (SEROQUEL) 200 MG tablet Take 1 tablet  by mouth at bedtime.       Patient Stressors:    Patient Strengths:    Treatment Modalities: Medication Management, Group therapy, Case management,  1 to 1 session with clinician, Psychoeducation, Recreational therapy.   Physician Treatment Plan for Primary Diagnosis: Schizoaffective disorder, bipolar type ([place]) Long Term Goal(s):     Short Term Goals: Ability to identify changes in lifestyle to reduce recurrence of condition will improve Ability to demonstrate self-control will improve Ability to identify and develop effective coping behaviors will improve Ability to maintain clinical measurements within normal limits will improve Compliance with prescribed medications will improve Ability to identify triggers associated with substance abuse/mental health issues will improve  Medication Management: Evaluate patient's response, side effects, and tolerance of  medication regimen.  Therapeutic Interventions: 1 to 1 sessions, Unit Group sessions and Medication administration.  Evaluation of Outcomes: Progressing  Physician Treatment Plan for Secondary Diagnosis: Principal Problem:   Schizoaffective disorder, bipolar type ([place])  Long Term Goal(s):     Short Term Goals: Ability to identify changes in lifestyle to reduce recurrence of condition will improve Ability to demonstrate self-control will improve Ability to identify and develop effective coping behaviors will improve Ability to maintain clinical measurements within normal limits will improve Compliance with prescribed medications will improve Ability to identify triggers associated with substance abuse/mental health issues will improve     Medication Management: Evaluate patient's response, side effects, and tolerance of medication regimen.  Therapeutic Interventions: 1 to 1 sessions, Unit Group sessions and Medication administration.  Evaluation of Outcomes: Progressing   RN Treatment Plan for Primary Diagnosis: Schizoaffective disorder, bipolar type ([place]) Long Term Goal(s): Knowledge of disease and therapeutic regimen to maintain health will improve  Short Term Goals: Ability to remain free from injury will improve, Ability to verbalize frustration and anger appropriately will improve, Ability to demonstrate self-control, Ability to participate in decision making will improve, Ability to verbalize feelings will improve, Ability to disclose and discuss suicidal ideas, Ability to identify and develop effective coping behaviors will improve, and Compliance with prescribed medications will improve  Medication Management: RN will administer medications as ordered by provider, will assess and evaluate patient's response and provide education to patient for prescribed medication. RN will report any adverse and/or side effects to prescribing provider.  Therapeutic Interventions: 1 on 1  counseling sessions, Psychoeducation, Medication administration, Evaluate responses to treatment, Monitor vital signs and CBGs as ordered, Perform/monitor CIWA, COWS, AIMS and Fall Risk screenings as ordered, Perform wound care treatments as ordered.  Evaluation of Outcomes: Progressing   LCSW Treatment Plan for Primary Diagnosis: Schizoaffective disorder, bipolar type ([place]) Long Term Goal(s): Safe transition to appropriate next level of care at discharge, Engage patient in therapeutic group addressing interpersonal concerns.  Short Term Goals: Engage patient in aftercare planning with referrals and resources, Increase social support, Increase ability to appropriately verbalize feelings, Increase emotional regulation, Facilitate acceptance of mental health diagnosis and concerns, Facilitate patient progression through stages of change regarding substance use diagnoses and concerns, Identify triggers associated with mental health/substance abuse issues, and Increase skills for wellness and recovery  Therapeutic Interventions: Assess for all discharge needs, 1 to 1 time with Social worker, Explore available resources and support systems, Assess for adequacy in community support network, Educate family and significant other(s) on suicide prevention, Complete Psychosocial Assessment, Interpersonal group therapy.  Evaluation of Outcomes: Progressing   Progress in Treatment: Attending groups: Yes. Participating in groups: Yes. Taking medication as prescribed: Yes. Toleration medication: Yes.  Family/Significant other contact made: No, will contact:  CSW will obtain consent to reach family/friend  Patient understands diagnosis: No. Discussing patient identified problems/goals with staff: Yes. Medical problems stabilized or resolved: Yes. Denies suicidal/homicidal ideation: Yes. Issues/concerns per patient self-inventory: Yes. Other: none  New problem(s) identified: No, Describe:  none  New  Short Term/Long Term Goal(s): Patient to work towards elimination of symptoms of psychosis, medication management for mood stabilization; development of comprehensive mental wellness plan.   Patient Goals:  Patient states their goal for treatment is to "go to school . . . I have already enrolled . . . Transportation to go to school."  Discharge Plan or Barriers: No psychosocial barriers identified at this time, patient to return to place of residence when appropriate for discharge.   Reason for Continuation of Hospitalization: Medication stabilization Other; describe psychosis   Estimated Length of Stay: 1-7 days   Scribe for Treatment Team: Larose Kells 10/23/2021 1:48 PM

## 2021-10-23 NOTE — [facility] Suicide Risk Assessment (Signed)
[facility] Admission Suicide Risk Assessment   Nursing information obtained from:  Patient Demographic factors:  NA Current Mental Status:  NA Loss Factors:  NA Historical Factors:  NA Risk Reduction Factors:  NA  Total Time spent with patient: 1.5 hours Principal Problem: Schizoaffective disorder, bipolar type ([name] [name]) Diagnosis:  Principal Problem:   Schizoaffective disorder, bipolar type ([place])   Subjective Data: [name] [name] is a 41 y.o., female with a long history of ED visits to multiple hospitals and past psychiatric history significant for manic behavior and schizoaffective bipolar disorder bipolar type who presents to the [facility] requesting medication refills, and per patient's caregiver reports patient has been off her medication for the past 3 days, subsequently transferred to [facility] under IVC for acute manic symptoms with psychosis.  CLINICAL FACTORS:   More than one psychiatric diagnosis Currently Psychotic Unstable or Poor Therapeutic Relationship Previous Psychiatric Diagnoses and Treatments  Mental Status Exam: This patient interview was conducted in-person in the presence of the resident physician initially, then with resident physician and attending physician. I personally interviewed the patient  along with Dr. Mamie Levers , and by the end of the interview, established good rapport with the patient.   Basic Cognition: [name] [name] is alert; she is oriented to person, oriented to place, and oriented to time, but not to situation.   Appearance and Grooming: Black female with documented age of 41 y.o. who presents congruently to her documented sex. Patient is seated in a/n upright posture; she appears as documented age, and is bizarrely dressed in colorful pajamas and matching slip ons . Grooming appears overall unkempt: hair is unkempt and in bizarre blue-green color  and fingernails appear clean. The patient has no noticeable scent or odor.  There are no noticeable scars present present, and there are no visible tattoos. There is no visible evidence of self harm (no cuts / ligature marks / cigarette burns, etc).   Behavior: The patient appears in no acute distress, and during the interview, was distracted and required frequent redirection; she was able to follow commands and compliant to requests and made good eye contact.   Patient does not appear to be responding to external stimuli.   Attitude: Patient was cooperative and open during the interview.   Motor activity: The patient's movement speed was normal; her gait was normal. There was no notable abnormal facial movements and no notable abnormal extremity movements.   Speech: The patient's speech was clear, fluent, with good articulation, with appropriately placed inflections, and notable American accent, which switched spontaneously to a [place] (?) accent. The volume of her speech was normal and excessive in quantity. The rate was pressured with a normal rhythm. Responses were normal in latency. There were no abnormal patterns in speech.   Mood: "I feel relieved that I got away from [place]"   Affect: Patient's affect is euthymic with broad range and labile fluctuations; her affect is congruent with her stated mood.   Thought Content The patient experiences no hallucinations. The patient describes delusions of grandeur, specifically that she is a doctor, Pharmacist, community, Animal nutritionist, preacher, and psychologist, persecutory delusions, specifically of her peers, love interests, the police, healthcare workers, acquaintances trying to hurt, rape, attack, steal from her, religious delusions, specifically that [place] or God speaks to her and gives her tendons to live by, and delusions of a romantic nature, specifically about getting married yesterday to a man she is attracted to ; she described no misperceptions of stimuli (illusions). The patient denies feelings  of derealization and denies  feelings of depersonalization. The patient denies ideas of reference; she denies thought insertion, denies thought withdrawal, denies thought interruption, and denies thought broadcasting.   Patient denies active suicidal intent and denies passive suicidal ideation; she denies homicidal intent, "I only told them I was homicidal because it was the smartest way to not get that girl in my room."   Thought Process The patient's thought process is tangential, switching from one topic to another with no logical connection, demonstrates flight of ideas, making it difficult to conduct an interview with her or to keep her on topic, and extremely disorganized, as evidenced by her multiple delusionary ideas .   Insight The patient demonstrates poor insight, as evidenced by lacking understanding of mental health condition/s, inability to identify trigger/s causing mental health decompensation, and inability to identify adaptive and maladaptive coping strategies.   Judgement The patient demonstrates poor judgement, as evidenced by unwillingness to voluntarily seek help / treatment, not adhering to medication regimen, and engaging inappropriately with staff / other patients.   Expanded Cognitive Exam: A more comprehensive cognitive exam is not indicated at this time.   Sleep  Sleep: Sleep: Poor   No data recorded   Physical Exam Vitals and nursing note reviewed.  Constitutional:      Appearance: Normal appearance.  HENT:     Head: Normocephalic and atraumatic.  Pulmonary:     Effort: Pulmonary effort is normal.  Neurological:     General: No focal deficit present.     Mental Status: She is alert. Mental status is at baseline.    Review of Systems  Constitutional: Negative.   HENT:         I can't swallow  Respiratory: Negative.  Negative for shortness of breath.   Cardiovascular: Negative.   Gastrointestinal: Negative.   Genitourinary: Negative.  Blood pressure 139/89, pulse (!) 110,  temperature (!) 97.5 F (36.4 C), temperature source Oral, resp. rate 18, SpO2 100 %, unknown if currently breastfeeding. There is no height or weight on file to calculate BMI.  COGNITIVE FEATURES THAT CONTRIBUTE TO RISK:  Loss of executive function    SUICIDE RISK:  Acute Risk:  Moderate:  Frequent suicidal ideation with limited intensity, and duration, some specificity in terms of plans, no associated intent, good self-control, limited dysphoria/symptomatology, some risk factors present, and identifiable protective factors, including available and accessible social support.  Chronic Risk:  Mild:  Suicidal ideation of limited frequency, intensity, duration, and specificity.  There are no identifiable plans, no associated intent, mild dysphoria and related symptoms, good self-control (both objective and subjective assessment), few other risk factors, and identifiable protective factors, including available and accessible social support.  PLAN OF CARE: Safety and Monitoring:             -- Involuntary admission to inpatient psychiatric unit for safety, stabilization and treatment             -- Daily contact with patient to assess and evaluate symptoms and progress in treatment             -- Patient's case to be discussed in multi-disciplinary team meeting             -- Observation Level : q15 minute checks             -- Vital signs:  q12 hours             -- Precautions: suicide, elopement, and assault   2.  Medications:              -- Start quetiapine 100 mg TID for mood stabilization and treatment of psychosis             -- Start quetiapine 400 mg QHS for sleep, mood stabilization, and treatment of psychosis             -- Start temazepam 15 mg QHS for sleep, agitation             -- Continue divalproex 500 mg BID for mood stabilization               -- Start lorazepam 1 mg IM or PO q6h PRN for agitation -- Start hydroxyzine 25 mg TID PRN for anxiety -- Start quetiapine 100 mg QHS  for insomnia -- Start quetiapine 200 mg q6h PRN for agitation -- Start temazepam 15 mg QHS for sleep, only to be administered if patient does not fall asleep after 1 hour -- Start chlorpromazine 25 mg IM q6h PRN for psychosis, agitation                -- Continue metformin 500 mg BID with meals for diabetes             -- Continue losartan 50 mg oral daily for hypertension             -- Patient does not need nicotine replacement   The risks/benefits/side-effects/alternatives to the above medication were discussed in detail with the patient and time was given for questions. The patient consents to medication trial. FDA black box warnings, if present, were discussed.   The patient is agreeable with the medication plan, as above. We will monitor the patient's response to pharmacologic treatment, and adjust medications as necessary.   3. Routine and other pertinent labs: EKG monitoring: QTc: 461   Metabolism / endocrine: BMI: There is no height or weight on file to calculate BMI. Prolactin: Recent Labs       Lab Results  Component Value Date    PROLACTIN 15.7 10/20/2021    PROLACTIN 46.5 (H) 07/25/2015      Lipid Panel: Recent Labs       Lab Results  Component Value Date    CHOL 180 10/20/2021    TRIG 14 10/20/2021    HDL 71 10/20/2021    CHOLHDL 2.5 10/20/2021    VLDL 3 10/20/2021    LDLCALC 106 (H) 10/20/2021    LDLCALC 103 (H) 03/08/2021      HbgA1c: Last Labs     Hgb A1c MFr Bld (%)  Date Value  10/20/2021 5.2      TSH: Last Labs     TSH (uIU/mL)  Date Value  10/20/2021 1.121        Drugs of Abuse  Labs (Brief)          Component Value Date/Time    LABOPIA NONE DETECTED 11/27/2020 0747    COCAINSCRNUR NONE DETECTED 11/27/2020 0747    LABBENZ POSITIVE (A) 11/27/2020 0747    AMPHETMU NONE DETECTED 11/27/2020 0747    THCU NONE DETECTED 11/27/2020 0747    LABBARB NONE DETECTED 11/27/2020 0747      VPA level <10 Lithium level < 10   4. Group  Therapy:             -- Encouraged patient to participate in unit milieu and in scheduled group therapies              --  Short Term Goals: Ability to identify changes in lifestyle to reduce recurrence of condition will improve, Ability to demonstrate self-control will improve, Ability to identify and develop effective coping behaviors will improve, Ability to maintain clinical measurements within normal limits will improve, Compliance with prescribed medications will improve, and Ability to identify triggers associated with substance abuse/mental health issues will improve             -- Long Term Goals: Improvement in symptoms so as ready for discharge -- Patient is encouraged to participate in group therapy while admitted to the psychiatric unit. -- We will address other chronic and acute stressors, which contributed to the patient's Schizoaffective disorder, bipolar type ([place]) in order to reduce the risk of self-harm at discharge.   5. Discharge Planning:              -- Social work and case management to assist with discharge planning and identification of hospital follow-up needs prior to discharge             -- Estimated LOS: 5-7 days             -- Discharge Concerns: Need to establish a safety plan; Medication compliance and effectiveness             -- Discharge Goals: Return home with outpatient referrals for mental health follow-up including medication management/psychotherapy  I certify that inpatient services furnished can reasonably be expected to improve the patient's condition.   Camelia Phenes, MD 10/23/2021, 1:57 PM

## 2021-10-23 NOTE — Group Note (Signed)
Type of Therapy and Topic:  Group Therapy:  Stress Management   Participation Level:  Did Not Attend    Description of Group:  Patients in this group were introduced to the idea of stress and encouraged to discuss negative and positive ways to manage stress. Patients discussed specific stressors that they have in their life right now and the physical signs and symptoms associated with that stress.  Patient encouraged to come up with positive changes to assist with the stress upon discharge in order to prevent future hospitalizations.   They also worked as a group on developing a specific plan for several patients to deal with stressors through [place], psychoeducation and self care techniques   Therapeutic Goals:               1)  To discuss the positive and negative impacts of stress             2)  identify signs and symptoms of stress             3)  generate ideas for stress management             4)  offer mutual support to others regarding stress management             5)  Developing plans for ways to manage specific stressors upon discharge               Summary of Patient Progress:  CSW and multiple staff tried to engage patient in participating in group with no luck.  Patient continues to decline participation. Patient also very manic and unable to appropriately engage with groups at this time.      Therapeutic Modalities:   Motivational Interviewing Brief Solution-Focused Therapy   Emmanuel Ercole, LCSW, [place] Social Worker  [facility]

## 2021-10-23 NOTE — Progress Notes (Signed)
PRN Thorazine 25 mg and ativan 1 mg administered to patient intramuscularly at 1301 for increased aggression, excessive agitation, and abusive speech toward staff. Patient has been disrupting the milieu, screaming at everyone around her, to the extent her action was affecting other patients on the unit. Patient tolerated medication well with no adverse effect noted. Patient is in bed at this time with less agitation, respiration rates at 18. No acute distress noted at this time. unit routine observation ongoing, staff will continue to provide support to patient.

## 2021-10-23 NOTE — [facility] Counselor (Signed)
CSW attempted to do a psychosocial assessment.  Patient was manic and unable to appropriately participate at this time.  Attempt 1x.    Alianis Trimmer, LCSW, [place] Social Worker  [facility]

## 2021-10-23 NOTE — [facility] Group Notes (Signed)
Patient did not attend the therapeutic group.
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
## 2021-10-23 NOTE — Progress Notes (Signed)
Recreation Therapy Notes  10.18.23:  Patient unable to be assessed at this time due to manic behavior.  Patient unable to focus.  Patient very hyper-religious and jumping from one topic to the next and unable to focus. LRT will attempt to complete assessment at a later time.      Avaley Coop-McCall, LRT,CTRS Kariya Lavergne A Phinneas Shakoor-McCall 10/23/2021 1:18 PM

## 2021-10-24 DIAGNOSIS — F250 Schizoaffective disorder, bipolar type: Secondary | ICD-10-CM | POA: Diagnosis not present

## 2021-10-24 MED ORDER — CHLORPROMAZINE HCL 25 MG/ML IJ SOLN
25.0000 mg | Freq: Two times a day (BID) | INTRAMUSCULAR | Status: DC
  Filled 2021-10-24 (×4): qty 1

## 2021-10-24 MED ORDER — QUETIAPINE FUMARATE 50 MG PO TABS
150.0000 mg | ORAL_TABLET | Freq: Two times a day (BID) | ORAL | Status: DC
  Administered 2021-10-24 – 2021-10-25 (×2): 150 mg via ORAL
  Filled 2021-10-24 (×6): qty 3

## 2021-10-24 NOTE — Progress Notes (Signed)
Recreation Therapy Notes  INPATIENT RECREATION THERAPY ASSESSMENT  Patient Details Name: [name] [name] MRN: [id number] DOB: [birth year] Today's Date: 10/24/2021       Information Obtained From: Patient  Able to Participate in Assessment/Interview: Yes (Pt stated the medications were "paralyzing" her.)  Patient Presentation:  (Drowsy)  Reason for Admission (Per Patient): Other (Comments) (Per chart: bizarre behavior)  Patient Stressors:  (None identified)  Coping Skills:   Other (Comment) (Pt stated she goes to the hospital as a coping skill)  Leisure Interests (2+):  Community - Other (Comment) (Networking; Talking to people)  Frequency of Recreation/Participation: Other (Comment) (Daily)  Awareness of Community Resources:  No ("but I'm sure I can navigate those places")  Expressed Interest in [place]: No  [place] of Residence:  [place]  Patient Main Form of Transportation:    Patient Strengths:  Preaching  Patient Identified Areas of Improvement:  "convincing my husband the ways of Christianity.  I want to stay home while he works like a wife supposed to do"  Patient Goal for Hospitalization:  "to be discharged to school"  Current SI (including self-harm):  No  Current HI:  No  Current AVH: No  Staff Intervention Plan: Group Attendance, Collaborate with Interdisciplinary Treatment Team  Consent to Intern Participation: N/A   Natale Barba-McCall, LRT,CTRS Duke Weisensel A Francisca Langenderfer-McCall 10/24/2021, 12:30 PM
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
## 2021-10-24 NOTE — Progress Notes (Signed)
   10/24/21 1100  Psych Admission Type (Psych Patients Only)  Admission Status Involuntary  Psychosocial Assessment  Patient Complaints Agitation;Anxiety;Confusion;Hyperactivity;Restlessness  Eye Contact Intense  Facial Expression Anxious  Affect Anxious;Irritable;Labile;Preoccupied  Speech Argumentative;Rapid;Pressured  Interaction Assertive;Attention-seeking;Dominating;Hypervigilant  Motor Activity Fidgety;Restless  Appearance/Hygiene Disheveled  Behavior Characteristics Agitated;Anxious;Intrusive  Mood Labile;Suspicious;Anxious;Irritable  Aggressive Behavior  Effect No apparent injury  Thought Horticulturist, commercial of ideas;Tangential;Incoherent  Content Preoccupation;Paranoia  Delusions Grandeur;Paranoid;Persecutory  Perception Derealization  Hallucination Auditory  Judgment Impaired  Confusion Mild  Danger to Self  Current suicidal ideation? Denies  Danger to Others  Danger to Others Reported or observed  Danger to Others Abnormal  Harmful Behavior to others No threats or harm toward other people  Destructive Behavior No threats or harm toward property

## 2021-10-24 NOTE — Progress Notes (Signed)
No acute distress overnight. Agitated, anxious medication ,compliant.    10/23/21 2200  Psych Admission Type (Psych Patients Only)  Admission Status Involuntary  Psychosocial Assessment  Patient Complaints Agitation;Anger  Eye Contact Intense  Facial Expression Angry  Affect Angry;Apprehensive;Blunted;Irritable;Preoccupied  Speech Aggressive;Argumentative;Pressured  Interaction Hypervigilant  Motor Activity Restless  Appearance/Hygiene Disheveled  Behavior Characteristics Agitated  Aggressive Behavior  Type of Behavior Verbal;Threatening  Effect No apparent injury  Thought Horticulturist, commercial of ideas;Incoherent  Content Preoccupation;Paranoia  Delusions Grandeur;Paranoid;Persecutory  Perception Derealization  Hallucination Auditory  Judgment Impaired  Confusion Mild  Danger to Self  Current suicidal ideation? Denies  Danger to Others  Danger to Others Reported or observed  Danger to Others Abnormal  Harmful Behavior to others Threats of violence towards other people observed or expressed   Description of Harmful Behavior Yelling, screaming, cursing

## 2021-10-24 NOTE — Group Note (Signed)
Recreation Therapy Group Note   Group Topic:Problem Solving  Group Date: 10/24/2021 Start Time: 0569 End Time: 1030 Facilitators: Billal Rollo-McCall, LRT,CTRS Location: [address]   Goal Area(s) Addresses:  Patient will effectively work with peer towards shared goal.  Patient will identify factors that guided their decision making.  Patient will pro-socially communicate ideas during group session.    Group Description: Patients were given a scenario that they were going to be stranded on a deserted [place] for several months before being rescued. Writer tasked them with making a list of 15 things they would choose to bring with them for "survival". The list of items was prioritized most important to least. Each patient would come up with their own list, then work together to create a new list of 15 items while in a group of 3-5 peers. LRT discussed each person's list and how it differed from others. The debrief included discussion of priorities, good decisions versus bad decisions, and how it is important to think before acting so we can make the best decision possible. LRT tied the concept of effective communication among group members to patient's support systems outside of the hospital and its benefit post discharge.   Affect/Mood: N/A   Participation Level: Did not attend    Clinical Observations/Individualized Feedback:     Plan: Continue to engage patient in RT group sessions 2-3x/week.   Taheerah Guldin-McCall, LRT,CTRS 10/24/2021 12:19 PM

## 2021-10-25 DIAGNOSIS — F250 Schizoaffective disorder, bipolar type: Secondary | ICD-10-CM | POA: Diagnosis not present

## 2021-10-25 MED ORDER — PALIPERIDONE ER 3 MG PO TB24
3.0000 mg | ORAL_TABLET | Freq: Every day | ORAL | Status: DC
  Filled 2021-10-25: qty 1

## 2021-10-25 MED ORDER — CHLORPROMAZINE HCL 25 MG/ML IJ SOLN
25.0000 mg | Freq: Two times a day (BID) | INTRAMUSCULAR | Status: DC
  Filled 2021-10-25 (×12): qty 1

## 2021-10-25 MED ORDER — LORAZEPAM 0.5 MG PO TABS
0.5000 mg | ORAL_TABLET | Freq: Three times a day (TID) | ORAL | Status: DC
  Administered 2021-10-26 – 2021-10-29 (×7): 0.5 mg via ORAL
  Filled 2021-10-25 (×9): qty 1

## 2021-10-25 MED ORDER — PALIPERIDONE ER 3 MG PO TB24
3.0000 mg | ORAL_TABLET | Freq: Every day | ORAL | Status: DC
  Administered 2021-10-25 – 2021-10-26 (×2): 3 mg via ORAL
  Filled 2021-10-25 (×3): qty 1

## 2021-10-25 MED ORDER — LORAZEPAM 1 MG PO TABS
1.0000 mg | ORAL_TABLET | Freq: Three times a day (TID) | ORAL | Status: AC
  Administered 2021-10-25: 1 mg via ORAL
  Filled 2021-10-25: qty 1

## 2021-10-25 MED ORDER — QUETIAPINE FUMARATE 200 MG PO TABS
200.0000 mg | ORAL_TABLET | Freq: Two times a day (BID) | ORAL | Status: DC
  Administered 2021-10-25 – 2021-10-30 (×10): 200 mg via ORAL
  Filled 2021-10-25 (×14): qty 1

## 2021-10-25 NOTE — Group Note (Signed)
Recreation Therapy Group Note   Group Topic:Team Building  Group Date: 10/25/2021 Start Time: 1000 End Time: 4462 Facilitators: Brentin Shin-McCall, LRT,CTRS Location: [address] Day[address]   Goal Area(s) Addresses:  Patient will effectively work with peer towards shared goal.  Patient will identify skills used to make activity successful.  Patient will identify how skills used during activity can be applied to reach post d/c goals.    Group Description: [employer]. In teams of 5-6, patients were given 11 craft pipe cleaners. Using the materials provided, patients were instructed to compete again the opposing team(s) to build the tallest free-standing structure from floor level. The activity was timed; difficulty increased by Probation officer as Pharmacist, hospital continued.  Systematically resources were removed with additional directions for example, placing one arm behind their back, working in silence, and shape stipulations. LRT facilitated post-activity discussion reviewing team processes and necessary communication skills involved in completion. Patients were encouraged to reflect how the skills utilized, or not utilized, in this activity can be incorporated to positively impact support systems post discharge.   Affect/Mood: Drowsy   Participation Level: None   Participation Quality: None   Behavior: Sedated   Speech/Thought Process: Delusional   Insight: Lacking   Judgement: Lacking    Modes of Intervention: STEM Activity   Patient Response to Interventions:  Challenging    Education Outcome:  In group clarification offered    Clinical Observations/Individualized Feedback: Pt was drowsy and would nod off during group.  Pt would wake up from sleep with random questions like do we have nail polish remover or how to buy a car off of someone.  Pt was unable to stay awake long enough to participate in group session.       Plan: Continue to engage patient in RT group  sessions 2-3x/week.   Kena Limon-McCall, LRT,CTRS 10/25/2021 11:24 AM

## 2021-10-25 NOTE — B[facility] Suicide Risk Assessment (Signed)
[place] INPATIENT:  Family/Significant Other Suicide Prevention Education  Suicide Prevention Education:  Contact Attempts: [employer] developer) [phone] ,  [name] [place]) [phone]  (name of family member/significant other) has been identified by the patient as the family member/significant other with whom the patient will be residing, and identified as the person(s) who will aid the patient in the event of a mental health crisis.  With written consent from the patient, two attempts were made to provide suicide prevention education, prior to and/or following the patient's discharge.  We were unsuccessful in providing suicide prevention education.  A suicide education pamphlet was given to the patient to share with family/significant other.  Date and time of first attempt:10/19/1:40pm Date and time of second attempt:10/20/3:45pm  [name] [name] [name] 10/25/2021, 3:47 PM

## 2021-10-25 NOTE — BHH Group Notes (Signed)
Spirituality group facilitated by Chaplain Katy Camry Theiss, BCC.  Group Description: Group focused on topic of community. Patients participated in facilitated discussion around topic, connecting with one another around experiences and definitions for community. Group members engaged with visual explorer photos, reflecting on what community looks like for them today. Group engaged in discussion around how their definitions of community are present today in hospital.  Modalities: Psycho-social ed, [place]ian, Narrative, MI  Patient Progress: Did not attend.  

## 2021-10-25 NOTE — Group Note (Signed)
Type of Therapy and Topic:  Group Therapy:  Stress Management   Participation Level:  Minimal    Description of Group:  Patients in this group were introduced to the idea of stress and encouraged to discuss negative and positive ways to manage stress. Patients discussed specific stressors that they have in their life right now and the physical signs and symptoms associated with that stress.  Patient encouraged to come up with positive changes to assist with the stress upon discharge in order to prevent future hospitalizations.   They also worked as a group on developing a specific plan for several patients to deal with stressors through [place], psychoeducation and self care techniques   Therapeutic Goals:               1)  To discuss the positive and negative impacts of stress             2)  identify signs and symptoms of stress             3)  generate ideas for stress management             4)  offer mutual support to others regarding stress management             5)  Developing plans for ways to manage specific stressors upon discharge               Summary of Patient Progress:  Patient participated minimally in group.  She had poor insight into group topic and perseverated on discharge.  CSW attempted to redirect with only little success. She reports that a stress of hers is being here against her will.    Therapeutic Modalities:   Motivational Interviewing Brief Solution-Focused Therapy   Mavric Cortright, LCSW, [place] Social Worker  [facility]
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
## 2021-10-25 NOTE — B[facility] Counselor (Signed)
Patient stopped CSW in the hallway stating that she has a court date on Monday and would need a letter to continue the court and excuse her. CSW tried to look up courtdate and was not able to find it. CSW unable to write a letter and informed patient that she has no court date on record.    [name] Koffman, LCSW, [place] Social Worker  [facility]

## 2021-10-25 NOTE — Progress Notes (Signed)
   10/25/21 1300  Psych Admission Type (Psych Patients Only)  Admission Status Involuntary  Psychosocial Assessment  Patient Complaints Agitation;Anxiety;Confusion;Restlessness  Eye Contact Intense  Facial Expression Anxious  Affect Angry;Apprehensive;Blunted;Labile;Preoccupied  Speech Aggressive;Argumentative;Pressured;Word salad  Interaction Assertive;Attention-seeking;Dominating  Motor Activity Restless;Fidgety  Appearance/Hygiene Disheveled  Behavior Characteristics Agitated;Anxious;Intrusive  Mood Labile;Suspicious;Anxious  Aggressive Behavior  Effect No apparent injury  Thought Horticulturist, commercial of ideas;Disorganized;Incoherent;Tangential  Content Preoccupation;Paranoia  Delusions Grandeur;Paranoid;Persecutory  Perception Derealization  Hallucination Auditory  Judgment Impaired  Confusion Mild  Danger to Self  Current suicidal ideation? Denies  Danger to Others  Danger to Others None reported or observed  Danger to Others Abnormal  Harmful Behavior to others No threats or harm toward other people  Destructive Behavior No threats or harm toward property

## 2021-10-25 NOTE — [facility] Group Notes (Signed)
Pt did not attend wrap up group this evening. Pt was in their room sleeping.

## 2021-10-25 NOTE — Progress Notes (Signed)
Med compliant, delusional, argumentative requiring verbal de-escalation. No acute distress noted during the shift   10/24/21 2300  Psych Admission Type (Psych Patients Only)  Admission Status Involuntary  Psychosocial Assessment  Patient Complaints Restlessness  Eye Contact Intense  Facial Expression Anxious  Affect Labile  Speech Pressured;Rapid  Interaction Assertive;Dominating  Motor Activity Restless;Fidgety  Appearance/Hygiene Disheveled  Behavior Characteristics Impulsive;Intrusive  Aggressive Behavior  Effect No apparent injury  Thought Process  Coherency Incoherent  Content Preoccupation;Paranoia  Delusions Grandeur;Paranoid  Perception Derealization  Hallucination Auditory  Judgment Impaired  Confusion Mild  Danger to Self  Current suicidal ideation? Denies  Danger to Others  Danger to Others Reported or observed  Danger to Others Abnormal  Harmful Behavior to others No threats or harm toward other people  Destructive Behavior No threats or harm toward property

## 2021-10-26 DIAGNOSIS — F250 Schizoaffective disorder, bipolar type: Secondary | ICD-10-CM | POA: Diagnosis not present

## 2021-10-26 MED ORDER — PALIPERIDONE ER 6 MG PO TB24
6.0000 mg | ORAL_TABLET | Freq: Every day | ORAL | Status: DC
  Administered 2021-10-26 – 2021-10-29 (×4): 6 mg via ORAL
  Filled 2021-10-26 (×6): qty 1

## 2021-10-26 MED ORDER — DIVALPROEX SODIUM 500 MG PO DR TAB
1000.0000 mg | DELAYED_RELEASE_TABLET | Freq: Every day | ORAL | Status: DC
  Administered 2021-10-26 – 2021-10-30 (×5): 1000 mg via ORAL
  Filled 2021-10-26 (×6): qty 2

## 2021-10-26 MED ORDER — DIVALPROEX SODIUM 500 MG PO DR TAB: 1000.0000 mg | DELAYED_RELEASE_TABLET | Freq: Every day | ORAL | Status: DC
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
## 2021-10-26 NOTE — [facility] Group Notes (Signed)
Pt. Did not attend group.

## 2021-10-26 NOTE — Progress Notes (Signed)
   10/25/21 2100  Psych Admission Type (Psych Patients Only)  Admission Status Involuntary  Psychosocial Assessment  Patient Complaints Anxiety  Eye Contact Fair  Facial Expression Anxious  Affect Appropriate to circumstance  Speech Slurred;Pressured  Interaction Guarded  Motor Activity Lethargic  Appearance/Hygiene In scrubs  Behavior Characteristics Anxious  Mood Depressed;Anxious  Thought Process  Coherency Disorganized  Content Delusions  Delusions Referential  Perception Derealization  Hallucination Auditory  Judgment Impaired  Confusion Mild  Danger to Self  Current suicidal ideation? Denies  Danger to Others  Danger to Others None reported or observed  Danger to Others Abnormal  Harmful Behavior to others No threats or harm toward other people  Destructive Behavior No threats or harm toward property

## 2021-10-26 NOTE — [facility] Group Notes (Signed)
Pt. Did not attend golds group.

## 2021-10-26 NOTE — Progress Notes (Signed)
   10/26/21 1145  Psych Admission Type (Psych Patients Only)  Admission Status Involuntary  Psychosocial Assessment  Patient Complaints Anxiety  Eye Contact Fair  Facial Expression Anxious  Affect Apprehensive  Speech Argumentative;Word salad  Interaction Demanding;Defensive  Motor Activity Fidgety;Restless  Appearance/Hygiene Improved  Behavior Characteristics Impulsive;Intrusive;Restless  Mood Preoccupied;Labile;Suspicious  Aggressive Behavior  Effect No apparent injury  Thought Process  Coherency Disorganized;Flight of ideas  Content Preoccupation;Paranoia  Delusions Paranoid;Persecutory;Grandeur  Perception Derealization  Hallucination Auditory  Judgment Impaired  Confusion Mild  Danger to Self  Current suicidal ideation? Denies  Danger to Others  Danger to Others None reported or observed  Danger to Others Abnormal  Harmful Behavior to others No threats or harm toward other people  Destructive Behavior No threats or harm toward property

## 2021-10-26 NOTE — Group Note (Signed)
  [facility] LCSW Group Therapy Note  Date/Time:  10/26/2021 11:15AM-12:00PM  Type of Therapy and Topic:  Group Therapy:  Feelings About Hospitalization  Participation Level:  Did Not Attend   Description of Group This process group involved patients discussing their feelings related to being hospitalized, as well as the benefits they see to being in the hospital.  These feelings and benefits were itemized.  The group then brainstormed specific ways in which they could seek those same benefits when they discharge and return home.  Therapeutic Goals Patient will identify and describe positive and negative feelings related to hospitalization Patient will verbalize benefits of hospitalization to themselves personally Patients will brainstorm together ways they can obtain similar benefits in the outpatient setting, identify barriers to wellness and possible solutions  Summary of Patient Progress:  Patient was invited to group, did not attend.   Therapeutic Modalities Cognitive Behavioral Therapy Motivational Interviewing    Selmer Dominion, LCSW 10/26/2021, 3:23 PM  .

## 2021-10-27 DIAGNOSIS — F250 Schizoaffective disorder, bipolar type: Secondary | ICD-10-CM | POA: Diagnosis not present

## 2021-10-27 MED ORDER — DOCUSATE SODIUM 100 MG PO CAPS
100.0000 mg | ORAL_CAPSULE | Freq: Every day | ORAL | Status: DC
  Administered 2021-10-27 – 2021-11-03 (×8): 100 mg via ORAL
  Filled 2021-10-27 (×11): qty 1

## 2021-10-27 MED ORDER — WHITE PETROLATUM EX OINT
TOPICAL_OINTMENT | CUTANEOUS | Status: AC
  Filled 2021-10-27: qty 5

## 2021-10-27 NOTE — Progress Notes (Signed)
   10/27/21 2200  Psych Admission Type (Psych Patients Only)  Admission Status Involuntary  Psychosocial Assessment  Patient Complaints Loneliness;Isolation  Eye Contact Fair  Facial Expression Anxious  Affect Depressed  Speech Slow;Pressured;Incoherent  Interaction Needy  Motor Activity Lethargic  Appearance/Hygiene Improved  Behavior Characteristics Calm;Appropriate to situation;Cooperative  Mood Pleasant  Thought Process  Insurance claims handler Blaming others  Delusions Paranoid  Perception Derealization  Hallucination Visual  Judgment Impaired  Confusion Mild  Danger to Self  Current suicidal ideation? Denies  Danger to Others  Danger to Others None reported or observed  Danger to Others Abnormal  Harmful Behavior to others No threats or harm toward other people  Destructive Behavior No threats or harm toward property

## 2021-10-27 NOTE — [facility] Group Notes (Signed)
Did not attend group 

## 2021-10-27 NOTE — Progress Notes (Signed)
   10/26/21 2000  Psych Admission Type (Psych Patients Only)  Admission Status Involuntary  Psychosocial Assessment  Patient Complaints Anxiety  Eye Contact Fair  Facial Expression Anxious  Affect Blunted;Depressed  Speech Pressured  Interaction Assertive  Motor Activity Lethargic  Appearance/Hygiene Improved  Behavior Characteristics Cooperative;Appropriate to situation  Mood Preoccupied  Thought Process  Coherency WDL  Content WDL  Delusions Paranoid  Perception Derealization  Hallucination Auditory  Judgment Impaired  Confusion None  Danger to Self  Current suicidal ideation? Denies  Danger to Others  Danger to Others None reported or observed  Danger to Others Abnormal  Harmful Behavior to others No threats or harm toward other people  Destructive Behavior No threats or harm toward property

## 2021-10-27 NOTE — Progress Notes (Signed)
Adult Psychoeducational Group Note  Date:  10/27/2021 Time:  8:44 PM  Group Topic/Focus:  Wrap-Up Group:   The focus of this group is to help patients review their daily goal of treatment and discuss progress on daily workbooks.  Participation Level:  Did Not Attend  Participation Quality:  Did Not Attend  Affect:  Did Not Attend  Cognitive:  Did Not Attend  Insight: Did Not Attend  Engagement in Group:  Did Not Attend  Modes of Intervention:  Did Not Attend  Additional Comments:   Pt was encouraged but refused to attend group discussion.   Gerhard Perches 10/27/2021, 8:44 PM
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
## 2021-10-27 NOTE — Progress Notes (Signed)
   10/27/21 1032  Psych Admission Type (Psych Patients Only)  Admission Status Involuntary  Psychosocial Assessment  Patient Complaints Anxiety;Disorientation  Eye Contact Fair  Facial Expression Anxious  Affect Blunted;Depressed  Speech Incoherent;Soft;Slurred  Interaction Needy  Motor Activity Lethargic  Appearance/Hygiene Unremarkable  Behavior Characteristics Cooperative;Appropriate to situation  Mood Preoccupied  Thought Process  Coherency WDL  Content Preoccupation  Delusions Religious  Perception Derealization  Hallucination Visual  Judgment Impaired  Confusion Mild  Danger to Self  Current suicidal ideation? Denies  Danger to Others  Danger to Others None reported or observed  Danger to Others Abnormal  Harmful Behavior to others No threats or harm toward other people  Destructive Behavior No threats or harm toward property

## 2021-10-27 NOTE — Progress Notes (Signed)
Pt presented with slurred speech, hard to understand and is delusional.  Pt needs consistent redirection and reassurance throughout the shift.  Pt denies SI/HI/AVH.  Pt is more alert than she was this morning.  RN will continue to monitor pt's progress and provide support as needed.

## 2021-10-27 NOTE — Group Note (Signed)
LCSW Group Therapy Note  10/27/2021      Type of Therapy and Topic:  Group Therapy: Gratitude  Participation Level:  Minimal   Description of Group:   In this group, patients shared and discussed the importance of acknowledging the elements in their lives for which they are grateful and how this can positively impact their mood.  The group discussed how bringing the positive elements of their lives to the forefront of their minds can help with recovery from any illness, physical or mental.  An exercise was done as a group in which a list was made of gratitude items in order to encourage participants to consider other potential positives in their lives.  Therapeutic Goals: Patients will identify one or more item for which they are grateful in each of 6 categories:  people, experiences, things, places, skills, and other. Patients will discuss how it is possible to seek out gratitude in even bad situations. Patients will explore other possible items of gratitude that they could remember.   Summary of Patient Progress:  The patient shared that she is grateful for "beautiful men, I can have 3 a day."  She kept falling sleep, would have to be awakened to participate.  Therapeutic Modalities:   Solution-Focused Therapy Activity  Berlin Hun Grossman-Orr, LCSW .

## 2021-10-28 ENCOUNTER — Encounter (HOSPITAL_COMMUNITY): Payer: Self-pay

## 2021-10-28 DIAGNOSIS — F250 Schizoaffective disorder, bipolar type: Secondary | ICD-10-CM | POA: Diagnosis not present

## 2021-10-28 MED ORDER — WHITE PETROLATUM EX OINT
TOPICAL_OINTMENT | CUTANEOUS | Status: AC
  Filled 2021-10-28: qty 5

## 2021-10-28 NOTE — [facility] Group Notes (Signed)
Adult Psychoeducational Group Note  Date:  10/28/2021 Time:  9:41 AM  Group Topic/Focus:  Goals Group:   The focus of this group is to help patients establish daily goals to achieve during treatment and discuss how the patient can incorporate goal setting into their daily lives to aide in recovery.  Participation Level:  Active  Participation Quality:  Appropriate  Affect:  Appropriate  Cognitive:  Appropriate  Insight: Appropriate  Engagement in Group:  Engaged  Modes of Intervention:  Discussion  Dub Mikes 10/28/2021, 9:41 AM
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
## 2021-10-28 NOTE — BH IP Treatment Plan (Signed)
Interdisciplinary Treatment and Diagnostic Plan Update  10/28/2021 Time of Session: 0830 [name] [name] MRN: [id number]  Principal Diagnosis: Schizoaffective disorder, bipolar type [facility])  Secondary Diagnoses: Principal Problem:   Schizoaffective disorder, bipolar type ([place])   Current Medications:  Current Facility-Administered Medications  Medication Dose Route Frequency Provider Last Rate Last Admin   acetaminophen (TYLENOL) tablet 650 mg  650 mg Oral Q6H PRN Leevy-Johnson, Brooke A, NP       alum & mag hydroxide-simeth (MAALOX/MYLANTA) 200-200-20 MG/5ML suspension 30 mL  30 mL Oral Q4H PRN Leevy-Johnson, Brooke A, NP   30 mL at 10/22/21 2200   chlorproMAZINE (THORAZINE) injection 25 mg  25 mg Intramuscular Q6H PRN Massengill, Ovid Curd, MD   25 mg at 10/23/21 1301   QUEtiapine (SEROQUEL) tablet 400 mg  400 mg Oral QHS Massengill, Ovid Curd, MD   400 mg at 10/27/21 2048   Or   chlorproMAZINE (THORAZINE) injection 25 mg  25 mg Intramuscular QHS Massengill, Ovid Curd, MD       QUEtiapine (SEROQUEL) tablet 200 mg  200 mg Oral BID Massengill, Ovid Curd, MD   200 mg at 10/28/21 1435   Or   chlorproMAZINE (THORAZINE) injection 25 mg  25 mg Intramuscular BID Massengill, Ovid Curd, MD       divalproex (DEPAKOTE) DR tablet 1,000 mg  1,000 mg Oral QHS Camelia Phenes, MD   1,000 mg at 10/27/21 2049   docusate sodium (COLACE) capsule 100 mg  100 mg Oral Daily Winfred Leeds, Nadir, MD   100 mg at 10/28/21 0109   hydrOXYzine (ATARAX) tablet 25 mg  25 mg Oral TID PRN Janine Limbo, MD   25 mg at 10/24/21 2053   LORazepam (ATIVAN) injection 1 mg  1 mg Intramuscular Q6H PRN Massengill, Ovid Curd, MD   1 mg at 10/23/21 1300   LORazepam (ATIVAN) tablet 0.5 mg  0.5 mg Oral TID Camelia Phenes, MD   0.5 mg at 10/28/21 1135   LORazepam (ATIVAN) tablet 1 mg  1 mg Oral Q6H PRN Massengill, Ovid Curd, MD       losartan (COZAAR) tablet 50 mg  50 mg Oral Daily Leevy-Johnson, Brooke A, NP   50 mg at 10/28/21 0820   magnesium hydroxide  (MILK OF MAGNESIA) suspension 30 mL  30 mL Oral Daily PRN Leevy-Johnson, Brooke A, NP   30 mL at 10/28/21 0243   menthol-cetylpyridinium (CEPACOL) lozenge 3 mg  1 lozenge Oral PRN Evette Georges, NP   3 mg at 10/28/21 0915   metFORMIN (GLUCOPHAGE-XR) 24 hr tablet 500 mg  500 mg Oral BID WC Leevy-Johnson, Brooke A, NP   500 mg at 10/28/21 0819   paliperidone (INVEGA) 24 hr tablet 6 mg  6 mg Oral QHS Camelia Phenes, MD   6 mg at 10/27/21 2049   QUEtiapine (SEROQUEL) tablet 100 mg  100 mg Oral QHS PRN Massengill, Ovid Curd, MD   100 mg at 10/27/21 2359   QUEtiapine (SEROQUEL) tablet 200 mg  200 mg Oral Q6H PRN Massengill, Ovid Curd, MD   200 mg at 10/23/21 0929   temazepam (RESTORIL) capsule 15 mg  15 mg Oral QHS Massengill, Ovid Curd, MD   15 mg at 10/27/21 2049   temazepam (RESTORIL) capsule 15 mg  15 mg Oral QHS PRN Massengill, Ovid Curd, MD       PTA Medications: Medications Prior to Admission  Medication Sig Dispense Refill Last Dose   divalproex (DEPAKOTE) 500 MG DR tablet Take 1 tablet by mouth 2 (two) times daily.   Past Month   losartan (COZAAR)  50 MG tablet Take 1 tablet by mouth daily.   Past Month   metFORMIN (GLUCOPHAGE) 500 MG tablet Take 1 tablet by mouth in the morning and at bedtime.   Past Month   benztropine (COGENTIN) 1 MG tablet Take 1 tablet by mouth 2 (two) times daily.      QUEtiapine (SEROQUEL) 200 MG tablet Take 1 tablet by mouth at bedtime.       Patient Stressors:    Patient Strengths:    Treatment Modalities: Medication Management, Group therapy, Case management,  1 to 1 session with clinician, Psychoeducation, Recreational therapy.   Physician Treatment Plan for Primary Diagnosis: Schizoaffective disorder, bipolar type ([place]) Long Term Goal(s):     Short Term Goals: Ability to identify changes in lifestyle to reduce recurrence of condition will improve Ability to demonstrate self-control will improve Ability to identify and develop effective coping behaviors will  improve Ability to maintain clinical measurements within normal limits will improve Compliance with prescribed medications will improve Ability to identify triggers associated with substance abuse/mental health issues will improve  Medication Management: Evaluate patient's response, side effects, and tolerance of medication regimen.  Therapeutic Interventions: 1 to 1 sessions, Unit Group sessions and Medication administration.  Evaluation of Outcomes: Progressing  Physician Treatment Plan for Secondary Diagnosis: Principal Problem:   Schizoaffective disorder, bipolar type ([place])  Long Term Goal(s):     Short Term Goals: Ability to identify changes in lifestyle to reduce recurrence of condition will improve Ability to demonstrate self-control will improve Ability to identify and develop effective coping behaviors will improve Ability to maintain clinical measurements within normal limits will improve Compliance with prescribed medications will improve Ability to identify triggers associated with substance abuse/mental health issues will improve     Medication Management: Evaluate patient's response, side effects, and tolerance of medication regimen.  Therapeutic Interventions: 1 to 1 sessions, Unit Group sessions and Medication administration.  Evaluation of Outcomes: Progressing   RN Treatment Plan for Primary Diagnosis: Schizoaffective disorder, bipolar type ([place]) Long Term Goal(s): Knowledge of disease and therapeutic regimen to maintain health will improve  Short Term Goals: Ability to remain free from injury will improve, Ability to verbalize frustration and anger appropriately will improve, Ability to demonstrate self-control, Ability to participate in decision making will improve, Ability to verbalize feelings will improve, Ability to disclose and discuss suicidal ideas, Ability to identify and develop effective coping behaviors will improve, and Compliance with prescribed  medications will improve  Medication Management: RN will administer medications as ordered by provider, will assess and evaluate patient's response and provide education to patient for prescribed medication. RN will report any adverse and/or side effects to prescribing provider.  Therapeutic Interventions: 1 on 1 counseling sessions, Psychoeducation, Medication administration, Evaluate responses to treatment, Monitor vital signs and CBGs as ordered, Perform/monitor CIWA, COWS, AIMS and Fall Risk screenings as ordered, Perform wound care treatments as ordered.  Evaluation of Outcomes: Progressing   LCSW Treatment Plan for Primary Diagnosis: Schizoaffective disorder, bipolar type ([place]) Long Term Goal(s): Safe transition to appropriate next level of care at discharge, Engage patient in therapeutic group addressing interpersonal concerns.  Short Term Goals: Engage patient in aftercare planning with referrals and resources, Increase social support, Increase ability to appropriately verbalize feelings, Increase emotional regulation, Facilitate acceptance of mental health diagnosis and concerns, Facilitate patient progression through stages of change regarding substance use diagnoses and concerns, Identify triggers associated with mental health/substance abuse issues, and Increase skills for wellness and recovery  Therapeutic Interventions:  Assess for all discharge needs, 1 to 1 time with Social worker, Explore available resources and support systems, Assess for adequacy in community support network, Educate family and significant other(s) on suicide prevention, Complete Psychosocial Assessment, Interpersonal group therapy.  Evaluation of Outcomes: Progressing   Progress in Treatment: Attending groups: No. Participating in groups: No. Taking medication as prescribed: Yes. Toleration medication: Yes. Family/Significant other contact made: No, will contact:  Unable to reach [facility] developer)  [phone] Patient understands diagnosis: No. Discussing patient identified problems/goals with staff: Yes. Medical problems stabilized or resolved: Yes. Denies suicidal/homicidal ideation: No. Issues/concerns per patient self-inventory: Yes. Other: none  New problem(s) identified: No, Describe:  none  New Short Term/Long Term Goal(s): Patient to work towards elimination of symptoms of psychosis, medication management for mood stabilization; elimination of SI thoughts; development of comprehensive mental wellness plan.  Patient Goals:  No additional goals identified at this time. Patient to continue to work towards original goals identified in initial treatment team meeting. CSW will remain available to patient should they voice additional treatment goals.   Discharge Plan or Barriers: CSW team to continue to assess discharge barriers, patient continues to be largely disorganized and unable to engage in productive conversation about discharge at this time.   Reason for Continuation of Hospitalization: Mania Medication stabilization Other; describe psychosis   Estimated Length of Stay: 1-7 days    Scribe for Treatment Team: Larose Kells 10/28/2021 3:32 PM

## 2021-10-28 NOTE — Group Note (Signed)
ype of Therapy and Topic:  Group Therapy:  Cycle of Depression   Participation Level:  Did not attend   Description of Group:  Patients in this group were introduced to the idea of the cycle of depression. Patients explored how stressors can trigger thoughts, feels and physical symptoms that can make you behave in ways that increase symptoms of depression.  Patient identified specific stressors that have triggered depression and explored thoughts that they have about themselves that may not always be true.   Patients encouraged to come up with positive changes and interventions put in place to stop cycle of depression. Patients also participated in discussion about benefits of being able to identify stressors, thoughts, feels and behavioral responses when depressed.      Therapeutic Goals:               1)  To discuss the positive and negative impacts of depressive feels             2)  identify signs and symptoms of depression             3)  discuss alternative behaviors to stop cycle of depression             4)  offer mutual support to others regarding depression             5)  Developing plans for ways to manage specific stressors upon discharge               Summary of Patient Progress:  Did not attend   Therapeutic Modalities:   Motivational Interviewing Brief Solution-Focused Therapy    Jordanny Waddington, LCSW, LCAS Clincal Social Worker  [facility]  

## 2021-10-28 NOTE — Progress Notes (Signed)
Pt is A&OX3, calm, religiously focused, disorganized thoughts, scattered speech, denies suicidal ideations, denies homicidal ideations, denies auditory hallucinations. Pt admits to visual hallucinations. "I see Angels. I tried to rebuke it in the name of Jesus. She didn't leave." Pt verbally agrees to approach staff if ideations become apparent. Pt denies experiencing nightmares. Mood and affect are congruent. Pt appetite is ok. No complaints of anxiety, distress, pain and/or discomfort at this time. Pt's memory appears to be grossly intact, and Pt hasn't displayed any injurious behaviors. Pt is medication compliant. There's no evidence of suicidal intent. Psychomotor activity was WNL. No s/s of Parkinson, Dystonia, Akathisia and/or Tardive Dyskinesia noted.

## 2021-10-28 NOTE — Progress Notes (Signed)
Adult Psychoeducational Group Note  Date:  10/28/2021 Time:  8:22 PM  Group Topic/Focus:  Wrap-Up Group:   The focus of this group is to help patients review their daily goal of treatment and discuss progress on daily workbooks.  Participation Level:  Did Not Attend  Participation Quality:  Did Not Attend  Affect:  Did Not Attend  Cognitive:  Did Not Attend  Insight: Did Not Attend  Engagement in Group:  Did Not Attend  Modes of Intervention:  Did Not Attend  Additional Comments:   Pt was encouraged but refused to attend group discussion.   Gerhard Perches 10/28/2021, 8:22 PM

## 2021-10-28 NOTE — Progress Notes (Signed)
   10/28/21 1138  Psych Admission Type (Psych Patients Only)  Admission Status Involuntary  Psychosocial Assessment  Patient Complaints Loneliness;Worrying;Depression  Eye Contact Fair  Facial Expression Sullen;Worried  Affect Depressed  Speech Tangential;Slow  Interaction Assertive;Needy  Motor Activity Slow  Appearance/Hygiene Improved  Behavior Characteristics Calm;Intrusive  Mood Helpless;Sullen  Thought Community education officer  Content Blaming others;Delusions  Delusions Paranoid;Religious;Persecutory  Perception Derealization  Hallucination None reported or observed  Judgment Impaired  Confusion Mild  Danger to Self  Current suicidal ideation? Denies  Agreement Not to Harm Self Yes  Description of Agreement verbal  Danger to Others  Danger to Others None reported or observed  Danger to Others Abnormal  Harmful Behavior to others No threats or harm toward other people  Destructive Behavior No threats or harm toward property

## 2021-10-28 NOTE — Progress Notes (Signed)
Pt keep on coming  to the nurses station complaining of not sleeping well, fluid retention and asking  for " water pill". Pt assessed with not pitting edema noticed. Pt also complained of sore throat which she stated started yesterday. Pt appeared sedated with slurred speech which is hard to understand. Pt given Seroquel 100 mg, milk of magnesia and Cepacol 3 mg for sore throat. Pt encouraged to stay in bed to avoid the fall, will continue to monitor.

## 2021-10-28 NOTE — Plan of Care (Signed)
  Problem: Education: Goal: Will be free of psychotic symptoms Outcome: Not Progressing   Problem: Health Behavior/Discharge Planning: Goal: Compliance with prescribed medication regimen will improve Outcome: Progressing   Problem: Nutritional: Goal: Ability to achieve adequate nutritional intake will improve Outcome: Progressing   Problem: Role Relationship: Goal: Ability to interact with others will improve Outcome: Progressing   Problem: Safety: Goal: Ability to remain free from injury will improve Outcome: Progressing

## 2021-10-29 DIAGNOSIS — F250 Schizoaffective disorder, bipolar type: Secondary | ICD-10-CM | POA: Diagnosis not present

## 2021-10-29 MED ORDER — LORAZEPAM 0.5 MG PO TABS
0.2500 mg | ORAL_TABLET | Freq: Three times a day (TID) | ORAL | Status: DC
  Administered 2021-10-29 – 2021-10-30 (×3): 0.25 mg via ORAL
  Filled 2021-10-29 (×3): qty 1

## 2021-10-29 MED ORDER — NYSTATIN 100000 UNIT/ML MT SUSP
5.0000 mL | Freq: Four times a day (QID) | OROMUCOSAL | Status: DC
  Administered 2021-10-29 – 2021-11-06 (×28): 500000 [IU] via OROMUCOSAL
  Filled 2021-10-29 (×41): qty 5

## 2021-10-29 NOTE — Progress Notes (Signed)
Adult Psychoeducational Group Note  Date:  10/29/2021 Time:  8:41 PM  Group Topic/Focus:  Wrap-Up Group:   The focus of this group is to help patients review their daily goal of treatment and discuss progress on daily workbooks.  Participation Level:  Did Not Attend  Participation Quality:  Did Not Attend  Affect:  Did Not Attend  Cognitive:  Did Not Attend  Insight: Did Not Attend  Engagement in Group:  Did Not Attend  Modes of Intervention:  Did Not Attend  Additional Comments:   Pt was encouraged to attend group discussion but refused   Gerhard Perches 10/29/2021, 8:41 PM

## 2021-10-29 NOTE — Group Note (Unsigned)
Recreation Therapy Group Note   Group Topic:Coping Skills  Group Date: 10/29/2021 Start Time: 1000 End Time: 1050 Facilitators: Krithika Tome-McCall, Gina Leblond A, NT Location: [address] Dayroom       Affect/Mood: {RT BHH Affect/Mood:26271}   Participation Level: {RT BHH Participation [employer]   Participation Quality: {RT BHH Participation Quality:26268}   Behavior: {RT BHH Group Behavior:26269}   Speech/Thought Process: {RT BHH Speech/Thought:26276}   Insight: {RT BHH Insight:26272}   Judgement: {RT BHH Judgement:26278}   Modes of Intervention: {RT BHH Modes of Intervention:26277}   Patient Response to Interventions:  {RT BHH Patient Response to Intervention:26274}   Education Outcome:  {RT [place] Education Outcome:26279}   Clinical Observations/Individualized Feedback: *** was *** in their participation of session activities and group discussion. Pt identified ***   Plan: {RT BHH Tx Plan:26280}   Artrice Kraker A Kristen Fromm-McCall, NT,  10/29/2021 11:10 AM

## 2021-10-29 NOTE — Plan of Care (Signed)
  Problem: Nutritional: Goal: Ability to achieve adequate nutritional intake will improve Outcome: Progressing   Problem: Role Relationship: Goal: Ability to communicate needs accurately will improve 10/29/2021 1807 by Harriet Masson, RN Outcome: Progressing 10/29/2021 1634 by Harriet Masson, RN Outcome: Progressing   Problem: Safety: Goal: Ability to redirect hostility and anger into socially appropriate behaviors will improve 10/29/2021 1807 by Harriet Masson, RN Outcome: Progressing 10/29/2021 1634 by Harriet Masson, RN Outcome: Progressing

## 2021-10-29 NOTE — Progress Notes (Signed)
   10/29/21 1629  Psych Admission Type (Psych Patients Only)  Admission Status Involuntary  Psychosocial Assessment  Patient Complaints Worrying  Eye Contact Fair  Facial Expression Flat  Affect Flat  Speech Tangential  Interaction Assertive  Motor Activity Slow  Appearance/Hygiene Unremarkable  Behavior Characteristics Irritable;Anxious  Mood Sad  Thought Process  Coherency Circumstantial;Disorganized;Tangential  Content Blaming others;Religiosity  Delusions Religious;Paranoid  Perception Derealization  Hallucination None reported or observed (pt denies)  Judgment Impaired  Confusion None  Danger to Self  Current suicidal ideation? Denies  Agreement Not to Harm Self Yes  Description of Agreement verbal  Danger to Others  Danger to Others None reported or observed  Danger to Others Abnormal  Harmful Behavior to others No threats or harm toward other people  Destructive Behavior No threats or harm toward property
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
## 2021-10-30 DIAGNOSIS — F250 Schizoaffective disorder, bipolar type: Secondary | ICD-10-CM | POA: Diagnosis not present

## 2021-10-30 MED ORDER — QUETIAPINE FUMARATE 100 MG PO TABS
100.0000 mg | ORAL_TABLET | Freq: Two times a day (BID) | ORAL | Status: DC
  Administered 2021-10-30 – 2021-11-01 (×4): 100 mg via ORAL
  Filled 2021-10-30 (×10): qty 1

## 2021-10-30 MED ORDER — CHLORPROMAZINE HCL 25 MG/ML IJ SOLN
25.0000 mg | Freq: Two times a day (BID) | INTRAMUSCULAR | Status: DC
  Filled 2021-10-30 (×4): qty 1

## 2021-10-30 MED ORDER — PALIPERIDONE ER 3 MG PO TB24
9.0000 mg | ORAL_TABLET | Freq: Every day | ORAL | Status: DC
  Administered 2021-10-30 – 2021-11-01 (×3): 9 mg via ORAL
  Filled 2021-10-30 (×6): qty 3

## 2021-10-30 MED ORDER — TEMAZEPAM 7.5 MG PO CAPS
7.5000 mg | ORAL_CAPSULE | Freq: Every day | ORAL | Status: DC
  Administered 2021-10-30: 7.5 mg via ORAL
  Filled 2021-10-30: qty 1

## 2021-10-30 MED ORDER — LORAZEPAM 0.5 MG PO TABS
0.2500 mg | ORAL_TABLET | Freq: Once | ORAL | Status: AC
  Administered 2021-10-30: 0.25 mg via ORAL
  Filled 2021-10-30: qty 1

## 2021-10-30 MED ORDER — LORAZEPAM 0.5 MG PO TABS
0.2500 mg | ORAL_TABLET | Freq: Once | ORAL | Status: AC
  Administered 2021-10-31: 0.25 mg via ORAL
  Filled 2021-10-30: qty 1
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
## 2021-10-30 NOTE — [facility] Suicide Risk Assessment (Signed)
[facility] INPATIENT:  Family/Significant Other Suicide Prevention Education  Suicide Prevention Education:  Education Completed; Raquel Sarna, patient caretaker, [phone]  (name of family member/significant other) has been identified by the patient as the family member/significant other with whom the patient will be residing, and identified as the person(s) who will aid the patient in the event of a mental health crisis (suicidal ideations/suicide attempt).  With written consent from the patient, the family member/significant other has been provided the following suicide prevention education, prior to the and/or following the discharge of the patient.  Shawn reports that patient has been living with the [place], [place], but that he believes that [place] is taking advantage of her.  Shawn reports that he is working on finding and getting her an apartment in [place], [place] but that he is still working on securing it.  Shawn agreed that he can come pick patient up on Monday and take back to [place] place.  He states that patient can go back.  At this time no additional safety concerns.   The suicide prevention education provided includes the following: Suicide risk factors Suicide prevention and interventions National Suicide Hotline telephone number [facility] assessment telephone number [facility] Emergency Assistance [place] and/or Residential Mobile Crisis Unit telephone number  Request made of family/significant other to: Remove weapons (e.g., guns, rifles, knives), all items previously/currently identified as safety concern.   Remove drugs/medications (over-the-counter, prescriptions, illicit drugs), all items previously/currently identified as a safety concern.  The family member/significant other verbalizes understanding of the suicide prevention education information provided.  The family member/significant other agrees to remove the items of safety concern listed  above.  Bev Drennen E Brilee [place] 10/30/2021, 2:49 PM

## 2021-10-30 NOTE — Group Note (Signed)
LCSW Group Therapy Note   Group Date: 10/30/2021 Start Time: 1300 End Time: 1400   Type of Therapy and Topic:  Group Therapy: Boundaries  Participation Level:  Active  Description of Group: This group will address the use of boundaries in their personal lives. Patients will explore why boundaries are important, the difference between healthy and unhealthy boundaries, and negative and postive outcomes of different boundaries and will look at how boundaries can be crossed.  Patients will be encouraged to identify current boundaries in their own lives and identify what kind of boundary is being set. Facilitators will guide patients in utilizing problem-solving interventions to address and correct types boundaries being used and to address when no boundary is being used. Understanding and applying boundaries will be explored and addressed for obtaining and maintaining a balanced life. Patients will be encouraged to explore ways to assertively make their boundaries and needs known to significant others in their lives, using other group members and facilitator for role play, support, and feedback.  Therapeutic Goals:  1.  Patient will identify areas in their life where setting clear boundaries could be  used to improve their life.  2.  Patient will identify signs/triggers that a boundary is not being respected. 3.  Patient will identify two ways to set boundaries in order to achieve balance in  their lives: 4.  Patient will demonstrate ability to communicate their needs and set boundaries  through discussion and/or role plays  Summary of Patient Progress:  [name] [name] was present/active throughout the session and proved open to feedback from [place] and peers. Patient demonstrated fair insight into the subject matter, was respectful of peers, and was present throughout the entire session. Patient discussed different behavior changes to assist with boundaries with people that she lives with.    Therapeutic  Modalities:   Cognitive Behavioral Therapy Solution-Focused Therapy  Zachery Conch, LCSW 10/30/2021  1:47 PM

## 2021-10-30 NOTE — Progress Notes (Signed)
   10/29/21 2130  Psych Admission Type (Psych Patients Only)  Admission Status Involuntary  Psychosocial Assessment  Patient Complaints Anxiety;Depression;Worrying  Eye Contact Fair  Facial Expression Flat  Affect Flat  Speech Tangential  Interaction Sarcastic;Intrusive  Motor Activity Slow  Appearance/Hygiene Unremarkable  Behavior Characteristics Cooperative;Anxious;Intrusive  Mood Anxious  Thought Process  Coherency Disorganized;Tangential  Content Delusions;Religiosity  Delusions Religious;Grandeur  Perception Derealization  Hallucination None reported or observed  Judgment Impaired  Confusion Mild  Danger to Self  Current suicidal ideation? Denies  Agreement Not to Harm Self Yes  Description of Agreement Verbal Contract  Danger to Others  Danger to Others None reported or observed  Danger to Others Abnormal  Harmful Behavior to others No threats or harm toward other people

## 2021-10-30 NOTE — Progress Notes (Signed)
Pt presents less disorganized and less tangential. Pt had questions about which meds she was taking but complied with all medications when administered. Pt shared her husband left her for someone else and that she is trying to "move on". Pt reports a good appetite, and no physical problems. Pt denies SI/HI/AVH and verbally contracts for safety. Provided support and encouragement. Pt safe on the unit. Q 15 minute safety checks continued.

## 2021-10-30 NOTE — Progress Notes (Signed)
   10/30/21 0800  Psych Admission Type (Psych Patients Only)  Admission Status Involuntary  Psychosocial Assessment  Patient Complaints Depression  Eye Contact Fair  Facial Expression Flat  Affect Flat  Interaction Isolative  Motor Activity Other (Comment) ([facility])  Appearance/Hygiene Unremarkable  Behavior Characteristics Cooperative  Mood Anxious  Aggressive Behavior  Effect No apparent injury  Thought Process  Coherency Disorganized  Content Delusions;Religiosity  Delusions Religious  Perception Derealization  Hallucination None reported or observed  Judgment Impaired  Confusion Mild  Danger to Self  Current suicidal ideation? Denies  Agreement Not to Harm Self Yes  Description of Agreement verbal  Danger to Others  Danger to Others None reported or observed  Danger to Others Abnormal  Harmful Behavior to others No threats or harm toward other people  Destructive Behavior No threats or harm toward property

## 2021-10-30 NOTE — Plan of Care (Signed)
  Problem: Safety: Goal: Violent Restraint(s) Outcome: Progressing   Problem: Coping: Goal: Coping ability will improve Outcome: Progressing Goal: Will verbalize feelings Outcome: Progressing

## 2021-10-31 DIAGNOSIS — F3010 Manic episode without psychotic symptoms, unspecified: Secondary | ICD-10-CM | POA: Diagnosis not present

## 2021-10-31 LAB — HEPATIC FUNCTION PANEL
ALT: 11 U/L (ref 0–44)
AST: 13 U/L — ABNORMAL LOW (ref 15–41)
Albumin: 3.6 g/dL (ref 3.5–5.0)
Alkaline Phosphatase: 59 U/L (ref 38–126)
Bilirubin, Direct: 0.1 mg/dL (ref 0.0–0.2)
Indirect Bilirubin: 0.2 mg/dL — ABNORMAL LOW (ref 0.3–0.9)
Total Bilirubin: 0.3 mg/dL (ref 0.3–1.2)
Total Protein: 7.7 g/dL (ref 6.5–8.1)

## 2021-10-31 LAB — VALPROIC ACID LEVEL: Valproic Acid Lvl: 104 ug/mL — ABNORMAL HIGH (ref 50.0–100.0)

## 2021-10-31 MED ORDER — DIVALPROEX SODIUM ER 500 MG PO TB24
1000.0000 mg | ORAL_TABLET | Freq: Every day | ORAL | Status: DC
  Administered 2021-10-31 – 2021-11-07 (×8): 1000 mg via ORAL
  Filled 2021-10-31 (×10): qty 2

## 2021-10-31 MED ORDER — PROPRANOLOL HCL 10 MG PO TABS
10.0000 mg | ORAL_TABLET | Freq: Two times a day (BID) | ORAL | Status: DC
  Administered 2021-10-31 – 2021-11-02 (×5): 10 mg via ORAL
  Filled 2021-10-31 (×8): qty 1
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
## 2021-10-31 NOTE — Progress Notes (Signed)
DAR NOTE: Patient presents with calm affect and pleasant mood.  Denies suicidal thoughts, auditory and visual hallucinations.  Rates depression at 0, hopelessness at 0, and anxiety at 0.  Maintained on routine safety checks.  Medications given as prescribed.  Support and encouragement offered as needed.  Attended group and participated.  States goal for today is "reuniting with partner."  Patient observed interacting with staff in the dayroom.  Offered no complaint.

## 2021-11-01 DIAGNOSIS — F3010 Manic episode without psychotic symptoms, unspecified: Secondary | ICD-10-CM | POA: Diagnosis not present

## 2021-11-01 MED ORDER — QUETIAPINE FUMARATE 200 MG PO TABS
200.0000 mg | ORAL_TABLET | Freq: Two times a day (BID) | ORAL | Status: DC
  Administered 2021-11-01 – 2021-11-02 (×2): 200 mg via ORAL
  Filled 2021-11-01 (×4): qty 1

## 2021-11-01 NOTE — Progress Notes (Signed)
   11/01/21 1000  Psych Admission Type (Psych Patients Only)  Admission Status Involuntary  Psychosocial Assessment  Patient Complaints Depression  Eye Contact Fair  Facial Expression Flat  Affect Flat  Speech Logical/coherent  Interaction Assertive  Motor Activity Slow  Appearance/Hygiene Unremarkable  Behavior Characteristics Cooperative  Mood Depressed  Thought Process  Coherency WDL  Content Religiosity  Delusions Religious  Perception Derealization  Hallucination None reported or observed  Judgment Impaired  Confusion None  Danger to Self  Current suicidal ideation? Denies  Danger to Others  Danger to Others None reported or observed

## 2021-11-01 NOTE — Progress Notes (Signed)
No acute distress overnight. Medication compliant   10/31/21 2200  Psych Admission Type (Psych Patients Only)  Admission Status Involuntary  Psychosocial Assessment  Patient Complaints Depression  Eye Contact Fair  Facial Expression Flat  Affect Flat  Speech Logical/coherent  Interaction Isolative  Motor Activity Slow  Appearance/Hygiene Unremarkable  Behavior Characteristics Cooperative  Mood Depressed  Thought Process  Coherency Disorganized  Content Delusions  Delusions Religious  Perception Derealization  Hallucination None reported or observed  Judgment Impaired  Confusion Mild  Danger to Self  Current suicidal ideation? Denies  Agreement Not to Harm Self Yes  Description of Agreement Verbal  Danger to Others  Danger to Others None reported or observed

## 2021-11-01 NOTE — Progress Notes (Signed)
Did not attend wrap up group

## 2021-11-01 NOTE — Group Note (Signed)
LCSW Group Therapy  Type of Therapy and Topic:  Group Therapy: Thoughts, Feelings, and Actions  Participation Level:  Minimal   Description of Group:   In this group, each patient discussed their previous experiencing and understanding of overthinking, identifying the harmful impact on their lives. As a group, each patient was introduced to the basic concepts of Cognitive Behavioral Therapy: that thoughts, feelings, and actions are all connected and influence one another. They were given examples of how overthinking can affect our feelings, actions, and vise versa. The group was then asked to analyze how overthinking was harmful and brainstorm alternative thinking patterns/reactions to the example situation. Then, each group member filled out and identified their own example situation in which a problem situation caused their thoughts, feelings, and actions to be negatively impacted; they were asked to come up with 3 new (more adaptive/positive) thoughts that led to 3 new feelings and actions.  Therapeutic Goals: Patients will review and discuss their past experience with overthinking. Patients will learn the basics of the CBT model through group-led examples.. Patients will identify situations where they may have negative thoughts, feelings, or actions and will then reframe the situation using more positive thoughts to react differently.  Summary of Patient Progress:  . Patient contributed to the discussion of how thoughts, feelings, and actions interact, noting when they may have experienced a negative thought pattern and recognized it as harmful. They were attentive when other patients shared their experiences, and worked to reframe their own thoughts in an activity to identify future situations where they may typically overthink.  Patient participated minimally in group but offered insight into group topic when directly asked questions  Therapeutic Modalities:   Cognitive Behavioral  Therapy Mindfulness  [name] [name], [place] 11/01/2021  11:37 AM

## 2021-11-01 NOTE — [facility] Counselor (Signed)
[facility] LCSW Progress Note   11/01/2021    10:35 AM  Baleria Wyman   [phone]   Type of Contact and Topic:  Discharge Planning  CSW called patient support, Shawn, and left a message confirming expected discharge of Monday. CSW left contact information so that support can call back to continue to coordinate discharge for patient pick up.     Signed:  Riki Altes MSW, LCSW, LCAS 11/01/2021 10:35 AM
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
## 2021-11-01 NOTE — [facility] Group Notes (Signed)
Spirituality group facilitated by Simone Curia, MDiv, [facility].  Group Description:  Group focused on topic of hope.  Patients participated in facilitated discussion around topic, connecting with one another around experiences and definitions for hope.  Group members engaged with visual explorer photos, reflecting on what hope looks like for them today.  Group engaged in discussion around how their definitions of hope are present today in hospital.   Modalities: Psycho-social ed, Adlerian, Narrative, MI Patient Progress: [name] [name] was invited to group.  Did not attend.

## 2021-11-01 NOTE — Progress Notes (Signed)
No acute distress noted. Medication compliant  10/31/21 2200  Psych Admission Type (Psych Patients Only)  Admission Status Involuntary  Psychosocial Assessment  Patient Complaints Depression  Eye Contact Fair  Facial Expression Flat  Affect Flat  Speech Logical/coherent  Interaction Isolative  Motor Activity Slow  Appearance/Hygiene Unremarkable  Behavior Characteristics Cooperative  Mood Depressed  Thought Process  Coherency Disorganized  Content Delusions  Delusions Religious  Perception Derealization  Hallucination None reported or observed  Judgment Impaired  Confusion Mild  Danger to Self  Current suicidal ideation? Denies  Agreement Not to Harm Self Yes  Description of Agreement Verbal  Danger to Others  Danger to Others None reported or observed

## 2021-11-02 DIAGNOSIS — F22 Delusional disorders: Secondary | ICD-10-CM | POA: Diagnosis present

## 2021-11-02 MED ORDER — PALIPERIDONE PALMITATE ER 234 MG/1.5ML IM SUSY: 234.0000 mg | PREFILLED_SYRINGE | Freq: Once | INTRAMUSCULAR | Status: DC

## 2021-11-02 MED ORDER — METOPROLOL SUCCINATE ER 25 MG PO TB24
25.0000 mg | ORAL_TABLET | Freq: Every day | ORAL | Status: AC
  Administered 2021-11-02: 25 mg via ORAL
  Filled 2021-11-02: qty 1

## 2021-11-02 MED ORDER — PALIPERIDONE PALMITATE ER 234 MG/1.5ML IM SUSY
234.0000 mg | PREFILLED_SYRINGE | Freq: Once | INTRAMUSCULAR | Status: AC
  Administered 2021-11-02: 234 mg via INTRAMUSCULAR
  Filled 2021-11-02 (×2): qty 1.5

## 2021-11-02 MED ORDER — METOPROLOL SUCCINATE ER 25 MG PO TB24
25.0000 mg | ORAL_TABLET | Freq: Every day | ORAL | Status: DC
  Administered 2021-11-02 – 2021-11-07 (×6): 25 mg via ORAL
  Filled 2021-11-02 (×7): qty 1

## 2021-11-02 MED ORDER — QUETIAPINE FUMARATE ER 200 MG PO TB24
800.0000 mg | ORAL_TABLET | Freq: Every day | ORAL | Status: DC
  Administered 2021-11-02: 800 mg via ORAL
  Filled 2021-11-02 (×2): qty 4

## 2021-11-02 MED ORDER — PALIPERIDONE PALMITATE ER 156 MG/ML IM SUSY: 156.0000 mg | PREFILLED_SYRINGE | Freq: Once | INTRAMUSCULAR | Status: DC

## 2021-11-02 MED ORDER — QUETIAPINE FUMARATE ER 400 MG PO TB24
800.0000 mg | ORAL_TABLET | Freq: Every day | ORAL | Status: DC
  Filled 2021-11-02: qty 2

## 2021-11-02 NOTE — Progress Notes (Signed)
   11/02/21 0000  Psych Admission Type (Psych Patients Only)  Admission Status Involuntary  Psychosocial Assessment  Patient Complaints None  Eye Contact Fair  Facial Expression Animated  Affect Sullen  Speech Logical/coherent  Interaction Assertive  Motor Activity Slow  Appearance/Hygiene Unremarkable  Behavior Characteristics Cooperative;Appropriate to situation  Mood Pleasant  Thought Process  Coherency WDL  Content Magical thinking  Delusions None reported or observed  Perception Derealization  Hallucination None reported or observed  Judgment Impaired  Confusion None  Danger to Self  Current suicidal ideation? Denies  Agreement Not to Harm Self Yes  Description of Agreement verbal  Danger to Others  Danger to Others None reported or observed  Danger to Others Abnormal  Harmful Behavior to others No threats or harm toward other people

## 2021-11-02 NOTE — Group Note (Signed)
  [facility] LCSW Group Therapy Note  Date/Time:  11/02/2021 11:15AM-12:00PM  Type of Therapy and Topic:  Group Therapy:  Facing Change at Discharge  Participation Level:  Did Not Attend   Description of Group This process group involved patients discussing what actions they need to take to continue to stay well at discharge.  These necessary actions were discussed in detail, including why they are important and methods to be used to ensure they are continued.  The group brainstormed together other possible actions that would benefit patients by being continued after discharge.  Therapeutic Goals Patients will identify and describe actions that have been helpful in the hospital that they wish to continue after discharge Patients will verbalize benefits of these actions Patients will describe specifically how they plan to keep these habits active Patients will brainstorm other necessary actions that can produce positive benefits in the outpatient setting  Summary of Patient Progress:  Patient was invited to group, did not attend.   Therapeutic Modalities Cognitive Behavioral Therapy Motivational Interviewing    Selmer Dominion, LCSW 11/02/2021, 5:06 PM

## 2021-11-02 NOTE — [facility] Group Notes (Signed)
The focus of this group is to help patients review their daily goal of treatment and discuss progress on daily workbooks.Pt didn't attend wrap up group.

## 2021-11-02 NOTE — [facility] Group Notes (Signed)
Goals Group 11/02/21   Group Focus: affirmation, clarity of thought, and goals/reality orientation Treatment Modality:  Psychoeducation Interventions utilized were assignment, group exercise, and support Purpose: To be able to understand and verbalize the reason for their admission to the hospital. To understand that the medication helps with their chemical imbalance but they also need to work on their choices in life. To be challenged to develop a list of 30 positives about themselves. Also introduce the concept that "feelings" are not reality.  Participation Level:  did not attend  [name] [name]
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
## 2021-11-02 NOTE — Plan of Care (Addendum)
Collateral information obtained [name] [name], reportedly patient's husband)  I called [name] [name] and he picked up, but states he is in a meeting and asked me to call back another time. He did not specify when before hanging up.  I called again 1 hour later and was directed straight to voicemail. I left a message asking him to call back the hospital.  I also called [name] [phone]) but was directed straight to gospel music - I waited for a while but no one picked up.

## 2021-11-03 MED ORDER — QUETIAPINE FUMARATE ER 200 MG PO TB24
400.0000 mg | ORAL_TABLET | Freq: Every day | ORAL | Status: DC
  Administered 2021-11-03 – 2021-11-07 (×5): 400 mg via ORAL
  Filled 2021-11-03 (×6): qty 1

## 2021-11-03 MED ORDER — SENNOSIDES-DOCUSATE SODIUM 8.6-50 MG PO TABS
1.0000 | ORAL_TABLET | Freq: Every day | ORAL | Status: DC
  Administered 2021-11-03 – 2021-11-08 (×6): 1 via ORAL
  Filled 2021-11-03 (×7): qty 1

## 2021-11-03 MED ORDER — PALIPERIDONE ER 6 MG PO TB24: 9.0000 mg | ORAL_TABLET | Freq: Every day | ORAL | Status: DC

## 2021-11-03 NOTE — Progress Notes (Signed)
   11/03/21 1400  Psych Admission Type (Psych Patients Only)  Admission Status Involuntary  Psychosocial Assessment  Patient Complaints Irritability  Eye Contact Fair  Facial Expression Animated  Affect Appropriate to circumstance  Speech Logical/coherent  Interaction Assertive  Motor Activity Slow  Appearance/Hygiene Unremarkable  Behavior Characteristics Cooperative  Mood Pleasant  Aggressive Behavior  Effect No apparent injury  Thought Process  Coherency WDL  Content Religiosity  Delusions None reported or observed  Perception Derealization  Hallucination None reported or observed  Judgment Impaired  Confusion None  Danger to Self  Current suicidal ideation? Denies  Agreement Not to Harm Self Yes  Description of Agreement Verbal  Danger to Others  Danger to Others None reported or observed  Danger to Others Abnormal  Harmful Behavior to others No threats or harm toward other people  Destructive Behavior No threats or harm toward property

## 2021-11-03 NOTE — Progress Notes (Signed)
Adult Psychoeducational Group Note  Date:  11/03/2021 Time:  9:52 PM  Group Topic/Focus:  Wrap-Up Group:   The focus of this group is to help patients review their daily goal of treatment and discuss progress on daily workbooks.  Participation Level:  Did Not Attend  Participation Quality:  Did Not Attend  Affect:  Did Not Attend  Cognitive:  Did Not Attend  Insight: Did Not Attend  Engagement in Group:  Did Not Attend  Modes of Intervention:  Did Not Attend  Additional Comments:   Pt was encouraged but refused to attend group discussion  Gerhard Perches 11/03/2021, 9:52 PM

## 2021-11-03 NOTE — [facility] Counselor (Signed)
Clinical Social Work Note  CSW left a voicemail for patient's [place] [phone], stating that patient has been in the hospital, is due to be discharged soon, expressing concern about her living situation and finances.  CSW recommended that patient have a Programmer, applications for her disability check.  This may be something that Care Coordinator can assist patient to obtain.  Selmer Dominion, LCSW 11/03/2021, 9:58 AM
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
## 2021-11-03 NOTE — [facility] Group Notes (Signed)
Adult Psychoeducational Group Note Date:  11/03/2021 Time:  1100-1130 Group Topic/Focus: PROGRESSIVE RELAXATION. A group where deep breathing is taught and tensing and relaxation muscle groups is used. Imagery is used as well.  Pts are asked to imagine 3 pillars that hold them up when they are not able to hold themselves up and to share that with the group.   Participation Level:  did not attend   Participation Level: [name] [name]

## 2021-11-03 NOTE — Group Note (Signed)
[facility] LCSW Group Therapy Note  Date/Time:  11/03/2021  11:00AM-12:00PM  Type of Therapy and Topic:  Group Therapy:  Music and Mood  Participation Level:  Did Not Attend   Description of Group: In this process group, members listened to a variety of genres of music and identified that different types of music evoke different responses.  Patients were encouraged to identify music that was soothing for them and music that was energizing for them.  Patients discussed how this knowledge can help with wellness and recovery in various ways including managing depression and anxiety as well as encouraging healthy sleep habits.    Therapeutic Goals: Patients will explore the impact of different varieties of music on mood Patients will verbalize the thoughts they have when listening to different types of music Patients will identify music that is soothing to them as well as music that is energizing to them Patients will discuss how to use this knowledge to assist in maintaining wellness and recovery Patients will explore the use of music as a coping skill  Summary of Patient Progress:  Patient did not arrive until the group was over.  Another song was played so she could hear at least the one song.  Therapeutic Modalities: Solution Focused Brief Therapy Activity   Selmer Dominion, LCSW

## 2021-11-04 ENCOUNTER — Encounter (HOSPITAL_COMMUNITY): Payer: Self-pay

## 2021-11-04 DIAGNOSIS — F319 Bipolar disorder, unspecified: Secondary | ICD-10-CM | POA: Diagnosis not present

## 2021-11-04 MED ORDER — PALIPERIDONE PALMITATE ER 234 MG/1.5ML IM SUSY: 234.0000 mg | PREFILLED_SYRINGE | Freq: Once | INTRAMUSCULAR | Status: DC

## 2021-11-04 MED ORDER — PALIPERIDONE PALMITATE ER 156 MG/ML IM SUSY: 156.0000 mg | PREFILLED_SYRINGE | Freq: Once | INTRAMUSCULAR | Status: DC

## 2021-11-04 MED ORDER — PALIPERIDONE PALMITATE ER 156 MG/ML IM SUSY
156.0000 mg | PREFILLED_SYRINGE | Freq: Once | INTRAMUSCULAR | Status: DC
  Filled 2021-11-04: qty 1

## 2021-11-04 NOTE — Discharge Instructions (Signed)
Dear [name] [name],  It was a pleasure to take care of you during your stay at [facility] where you were treated for your Schizoaffective disorder, bipolar type ([place]).  While you were here, you were:  observed and cared for by our nurses and nursing assistants  treated with medications by your psychiatrists  provided individual and group therapy by therapists  provided resources by our social workers and case managers  Please review the medication list provided to you at discharge and stop, start taking, or continue taking the medications listed there.  You should also follow-up with your primary care doctor, or start seeing one if you don't have one yet. Here are some scheduled follow-ups for you:  Follow-up [facility] Follow up.   Why: You may call this provider regarding therapy and medication management services. Contact information: [address]ette Reich[place], [place] [address] Phone: [phone]        [facility] Follow up.   Specialty: Behavioral Health Why: You may go to this provider for an assessment, to obtain therapy and medication management services on Monday or Wednesday, arrive by 7:30 am.  Services are provided on a first come, first served basis. Contact information: [place]. Call.   Why: Please reach out to this provider if you relocate to [place], [place] for ongoing mental health support, Med Management and therapy. Contact information: [address], [place] [address]. Main Phone Line [phone] Main Fax Line [phone]        [facility] Call.   Why: You have been referred for ACTT services.  Call to get connected and schedule an initial assessment while you are in [place]. Contact information: [place]  [id number] [phone]                  I recommend abstinence from alcohol, tobacco, and other illicit drug use.    If your psychiatric symptoms or suicidal thoughts recur, worsen, or if you have side effects to your psychiatric medications, call your outpatient psychiatric provider, 911, [phone] or go to the nearest emergency department.  Take care!  Camelia Phenes, MD [facility] (Physician) 11/04/2021 8:49 AM

## 2021-11-04 NOTE — Progress Notes (Signed)
D: Pt denied SI/HI/AVH this morning. Pt denied feeling anxious or depressed. Pt presented with an anxious affect and voiced concern about wanting to talk to a lawyer about gaining custody of her children. Pt has been cooperative throughout the shift. Pt seen interacting on the unit and participating in groups throughout the day.  A: RN provided support and encouragement to patient. Pt given scheduled medications as prescribed. Q15 min checks verified for safety.    R: Patient verbally contracts for safety. Patient compliant with medications and treatment plan. Patient is interacting well on the unit. Pt is safe on the unit.   11/04/21 0957  Psych Admission Type (Psych Patients Only)  Admission Status Involuntary  Psychosocial Assessment  Patient Complaints Anxiety;Depression  Eye Contact Fair  Facial Expression Anxious  Affect Appropriate to circumstance  Speech Logical/coherent  Interaction Assertive  Motor Activity Slow  Appearance/Hygiene Unremarkable  Behavior Characteristics Cooperative;Appropriate to situation  Mood Anxious;P[place]asant  Thought Process  Coherency WDL  Content Religiosity  Delusions None reported or observed  Perception Derealization  Hallucination None reported or observed  Judgment Impaired  Confusion None  Danger to Self  Current suicidal ideation? Denies  Agreement Not to Harm Self Yes  Description of Agreement Verbal  Danger to Others  Danger to Others None reported or observed

## 2021-11-04 NOTE — Progress Notes (Signed)
[facility] LCSW Progress Note   11/04/2021    10:42 AM  [name] [name]   [id number]   Type of Contact and Topic:  discharge change  Per doctor request, discharge date is changed to Friday, Nov 3rd due to medication stabilization.  Patient support, Raquel Sarna, agreed to pick patient up on 11/3.   Signed:  Riki Altes MSW, LCSW, LCAS 11/04/2021 10:42 AM

## 2021-11-04 NOTE — Group Note (Signed)
Recreation Therapy Group Note   Group Topic:Self-Esteem  Group Date: 11/04/2021 Start Time: 1000 End Time: 0263 Facilitators: Ellyson Rarick-McCall, LRT,CTRS Location: [address] Dayroom   Goal Area(s) Addresses:  Patient will identify positive stress management techniques. Patient will identify benefits of using stress management post d/c.   Group Description: Meditation.  LRT played a meditation that focused getting renewed for a new day.  It also reinforced the value of positive self talk and keeping a positive attitude.  Patients were to sit comfortable and focus on the meditation as it played to fully engage, relax and clear their minds of any distractions.    Affect/Mood: Appropriate   Participation Level: Active   Participation Quality: Independent   Behavior: Appropriate   Speech/Thought Process: Relevant and Rational   Insight: Moderate   Judgement: Moderate   Modes of Intervention: Art and Music   Patient Response to Interventions:  Engaged   Education Outcome:  Acknowledges education and In group clarification offered    Clinical Observations/Individualized Feedback: Pt was appropriate and interactive during group session.  Pt was somewhat worried about being able to go home. Pt stated her husband was unable to come get her due to car issues.  Pt did participate in some of the exercises.  Pt was very religious focused on her license plate.  Pt expressed she puts God first and basically lives by the 10 Commandments.     Plan: Continue to engage patient in RT group sessions 2-3x/week.   [name] [name], LRT,CTRS 11/04/2021 12:53 PM
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
## 2021-11-04 NOTE — BH IP Treatment Plan (Signed)
Interdisciplinary Treatment and Diagnostic Plan Update  11/04/2021 Time of Session: 0830 [name] [name] MRN: [id number]  Principal Diagnosis: Schizoaffective disorder, bipolar type [facility])  Secondary Diagnoses: Principal Problem:   Schizoaffective disorder, bipolar type ([place]) Active Problems:   Manic behavior ([place])   Delusional disorder ([place])   Current Medications:  Current Facility-Administered Medications  Medication Dose Route Frequency Provider Last Rate Last Admin   acetaminophen (TYLENOL) tablet 650 mg  650 mg Oral Q6H PRN Leevy-Johnson, Brooke A, NP       alum & mag hydroxide-simeth (MAALOX/MYLANTA) 200-200-20 MG/5ML suspension 30 mL  30 mL Oral Q4H PRN Leevy-Johnson, Brooke A, NP   30 mL at 10/22/21 2200   chlorproMAZINE (THORAZINE) injection 25 mg  25 mg Intramuscular Q6H PRN Massengill, Ovid Curd, MD   25 mg at 10/23/21 1301   divalproex (DEPAKOTE ER) 24 hr tablet 1,000 mg  1,000 mg Oral Al Corpus, MD   1,000 mg at 11/03/21 2154   hydrOXYzine (ATARAX) tablet 25 mg  25 mg Oral TID PRN Janine Limbo, MD   25 mg at 10/24/21 2053   LORazepam (ATIVAN) injection 1 mg  1 mg Intramuscular Q6H PRN Massengill, Ovid Curd, MD   1 mg at 10/23/21 1300   LORazepam (ATIVAN) tablet 1 mg  1 mg Oral Q6H PRN Massengill, Ovid Curd, MD       losartan (COZAAR) tablet 50 mg  50 mg Oral Daily Leevy-Johnson, Brooke A, NP   50 mg at 11/03/21 1004   magnesium hydroxide (MILK OF MAGNESIA) suspension 30 mL  30 mL Oral Daily PRN Leevy-Johnson, Brooke A, NP   30 mL at 10/28/21 0243   menthol-cetylpyridinium (CEPACOL) lozenge 3 mg  1 lozenge Oral PRN Evette Georges, NP   3 mg at 10/28/21 0915   metFORMIN (GLUCOPHAGE-XR) 24 hr tablet 500 mg  500 mg Oral BID WC Leevy-Johnson, Brooke A, NP   500 mg at 11/03/21 1710   metoprolol succinate (TOPROL-XL) 24 hr tablet 25 mg  25 mg Oral QHS Camelia Phenes, MD   25 mg at 11/03/21 2155   nystatin (MYCOSTATIN) 100000 UNIT/ML suspension 500,000 Units  5 mL Mouth/Throat  QID Camelia Phenes, MD   500,000 Units at 11/03/21 2155   [START ON 11/06/2021] paliperidone (INVEGA SUSTENNA) injection 156 mg  156 mg Intramuscular Once Camelia Phenes, MD       QUEtiapine (SEROQUEL XR) 24 hr tablet 400 mg  400 mg Oral QHS Merrily Brittle, DO   400 mg at 11/03/21 2155   QUEtiapine (SEROQUEL) tablet 200 mg  200 mg Oral Q6H PRN Massengill, Ovid Curd, MD   200 mg at 10/23/21 6553   senna-docusate (Senokot-S) tablet 1 tablet  1 tablet Oral Daily Merrily Brittle, DO   1 tablet at 11/03/21 1214   PTA Medications: Medications Prior to Admission  Medication Sig Dispense Refill Last Dose   divalproex (DEPAKOTE) 500 MG DR tablet Take 1 tablet by mouth 2 (two) times daily.   Past Month   losartan (COZAAR) 50 MG tablet Take 1 tablet by mouth daily.   Past Month   metFORMIN (GLUCOPHAGE) 500 MG tablet Take 1 tablet by mouth in the morning and at bedtime.   Past Month   benztropine (COGENTIN) 1 MG tablet Take 1 tablet by mouth 2 (two) times daily.      QUEtiapine (SEROQUEL) 200 MG tablet Take 1 tablet by mouth at bedtime.       Patient Stressors:    Patient Strengths:    Treatment Modalities: Medication Management, Group  therapy, Case management,  1 to 1 session with clinician, Psychoeducation, Recreational therapy.   Physician Treatment Plan for Primary Diagnosis: Schizoaffective disorder, bipolar type ([place]) Long Term Goal(s):     Short Term Goals: Ability to identify changes in lifestyle to reduce recurrence of condition will improve Ability to demonstrate self-control will improve Ability to identify and develop effective coping behaviors will improve Ability to maintain clinical measurements within normal limits will improve Compliance with prescribed medications will improve Ability to identify triggers associated with substance abuse/mental health issues will improve  Medication Management: Evaluate patient's response, side effects, and tolerance of medication regimen.  Therapeutic  Interventions: 1 to 1 sessions, Unit Group sessions and Medication administration.  Evaluation of Outcomes: Progressing  Physician Treatment Plan for Secondary Diagnosis: Principal Problem:   Schizoaffective disorder, bipolar type ([place]) Active Problems:   Manic behavior ([place])   Delusional disorder ([place])  Long Term Goal(s):     Short Term Goals: Ability to identify changes in lifestyle to reduce recurrence of condition will improve Ability to demonstrate self-control will improve Ability to identify and develop effective coping behaviors will improve Ability to maintain clinical measurements within normal limits will improve Compliance with prescribed medications will improve Ability to identify triggers associated with substance abuse/mental health issues will improve     Medication Management: Evaluate patient's response, side effects, and tolerance of medication regimen.  Therapeutic Interventions: 1 to 1 sessions, Unit Group sessions and Medication administration.  Evaluation of Outcomes: Progressing   RN Treatment Plan for Primary Diagnosis: Schizoaffective disorder, bipolar type ([place]) Long Term Goal(s): Knowledge of disease and therapeutic regimen to maintain health will improve  Short Term Goals: Ability to remain free from injury will improve, Ability to verbalize frustration and anger appropriately will improve, Ability to demonstrate self-control, Ability to participate in decision making will improve, Ability to verbalize feelings will improve, Ability to disclose and discuss suicidal ideas, Ability to identify and develop effective coping behaviors will improve, and Compliance with prescribed medications will improve  Medication Management: RN will administer medications as ordered by provider, will assess and evaluate patient's response and provide education to patient for prescribed medication. RN will report any adverse and/or side effects to prescribing  provider.  Therapeutic Interventions: 1 on 1 counseling sessions, Psychoeducation, Medication administration, Evaluate responses to treatment, Monitor vital signs and CBGs as ordered, Perform/monitor CIWA, COWS, AIMS and Fall Risk screenings as ordered, Perform wound care treatments as ordered.  Evaluation of Outcomes: Progressing   LCSW Treatment Plan for Primary Diagnosis: Schizoaffective disorder, bipolar type ([place]) Long Term Goal(s): Safe transition to appropriate next level of care at discharge, Engage patient in therapeutic group addressing interpersonal concerns.  Short Term Goals: Engage patient in aftercare planning with referrals and resources, Increase social support, Increase ability to appropriately verbalize feelings, Increase emotional regulation, Facilitate acceptance of mental health diagnosis and concerns, Facilitate patient progression through stages of change regarding substance use diagnoses and concerns, Identify triggers associated with mental health/substance abuse issues, and Increase skills for wellness and recovery  Therapeutic Interventions: Assess for all discharge needs, 1 to 1 time with Social worker, Explore available resources and support systems, Assess for adequacy in community support network, Educate family and significant other(s) on suicide prevention, Complete Psychosocial Assessment, Interpersonal group therapy.  Evaluation of Outcomes: Progressing   Progress in Treatment: Attending groups: Yes. Participating in groups: Yes. Taking medication as prescribed: Yes. Toleration medication: Yes. Family/Significant other contact made: Yes, individual(s) contacted:  Raquel Sarna, patient caretaker, [phone] Patient  understands diagnosis: No. Discussing patient identified problems/goals with staff: Yes. Medical problems stabilized or resolved: Yes. Denies suicidal/homicidal ideation: No. Issues/concerns per patient self-inventory: Yes. Other: none  New  problem(s) identified: Yes, Describe:  none  New Short Term/Long Term Goal(s): Patient to work towards elimination of symptoms of psychosis, medication management for mood stabilization;  development of comprehensive mental wellness plan.  Patient Goals: No additional goals identified at this time. Patient to continue to work towards original goals identified in initial treatment team meeting. CSW will remain available to patient should they voice additional treatment goals.   Discharge Plan or Barriers: No psychosocial barriers identified at this time, patient to return to place of residence when appropriate for discharge.   Reason for Continuation of Hospitalization: Medication stabilization Other; describe psychosis   Estimated Length of Stay: 1-7 days    Scribe for Treatment Team: Larose Kells 11/04/2021 9:22 AM

## 2021-11-04 NOTE — Plan of Care (Signed)
  Problem: Coping: Goal: Coping ability will improve Outcome: Progressing Goal: Will verbalize feelings Outcome: Progressing

## 2021-11-04 NOTE — Progress Notes (Signed)
Adult Psychoeducational Group Note  Date:  11/04/2021 Time:  8:33 PM  Group Topic/Focus:  Wrap-Up Group:   The focus of this group is to help patients review their daily goal of treatment and discuss progress on daily workbooks.  Participation Level:  Active  Participation Quality:  Appropriate  Affect:  Appropriate  Cognitive:  Oriented  Insight: Appropriate  Engagement in Group:  Engaged  Modes of Intervention:  Discussion  Additional Comments:   Pt states that she had a good day due to the fact she was able to talk to family and take notes in group today. Pt states she was able to be physically active in the gym and she's been enjoying going down for activities. Pt denied everything and states that she's looking forward to seeing her children when she leaves.   Gerhard Perches 11/04/2021, 8:33 PM

## 2021-11-04 NOTE — [facility] Counselor (Signed)
[facility] LCSW Progress Note   11/04/2021    1:50 PM  [name] [name]   [id number]   Type of Contact and Topic:  IVC court hearing  CSW spoke with patient regarding court hearing on Tuesday for IVC.  Patient states that she is okay with discharge plan of Friday and waived speaking to her attorney, Pilar Plate.  CSW informed patient attorney and attorney waived court hearing for tomorrow.     Signed:  Riki Altes MSW, LCSW, LCAS 11/04/2021 1:50 PM
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
## 2021-11-04 NOTE — Group Note (Signed)
LCSW Group Therapy Note  Group Date: 11/04/2021 Start Time: 1300 End Time: 1400   Type of Therapy and Topic:  Group Therapy - How To Cope with Nervousness about Discharge   Participation Level:  Minimal   Description of Group This process group involved identification of patients' feelings about discharge. Some of them are scheduled to be discharged soon, while others are new admissions, but each of them was asked to share thoughts and feelings surrounding discharge from the hospital. One common theme was that they are excited at the prospect of going home, while another was that many of them are apprehensive about sharing why they were hospitalized. Patients were given the opportunity to discuss these feelings with their peers in preparation for discharge.  Therapeutic Goals  Patient will identify their overall feelings about pending discharge. Patient will think about how they might proactively address issues that they believe will once again arise once they get home (i.e. with parents). Patients will participate in discussion about having hope for change.   Summary of Patient Progress: [name] [name] participated minimally throughout group.  She came to group halfway through group session. [name] [name] demonstrated fair insight into the subject matter, and proved open to input from peers and feedback from [place].   Therapeutic Modalities Cognitive Behavioral Therapy   Zachery Conch, LCSW 11/04/2021  1:43 PM

## 2021-11-04 NOTE — Plan of Care (Signed)
  Problem: Coping: Goal: Will verbalize feelings Outcome: Progressing   Problem: Health Behavior/Discharge Planning: Goal: Compliance with prescribed medication regimen will improve Outcome: Progressing   Problem: Role Relationship: Goal: Ability to interact with others will improve Outcome: Progressing

## 2021-11-05 DIAGNOSIS — F319 Bipolar disorder, unspecified: Secondary | ICD-10-CM | POA: Diagnosis not present
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
## 2021-11-05 NOTE — Progress Notes (Signed)
   11/05/21 0536  Sleep  Number of Hours 3.5
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
## 2021-11-05 NOTE — Progress Notes (Signed)
Patient refused labs and weight this morning.

## 2021-11-05 NOTE — Progress Notes (Signed)
[facility] MD Progress Note  11/06/2021 11:55 AM [name] [name]  MRN:  [id number]  Principal Problem: Schizoaffective disorder, bipolar type ([place]) Diagnosis: Principal Problem:   Schizoaffective disorder, bipolar type ([place]) Active Problems:   Manic behavior ([place])   Delusional disorder ([place])   Reason for Admission:  [name] [name] is a 41 y.o. female with past psychiatric history significant for schizoaffective disorder bipolar type and with 12 ED visits over the past 6 months for manic behavior who presents to the [facility] requesting medication refills in the setting of reported medication noncompliance who was subsequently transferred to [facility] under IVC for acute manic symptoms with psychosis (admitted on 10/22/2021, total  LOS: 15 days )  Chart Review from last 24 hours:  The patient's chart was reviewed and nursing notes were reviewed. The patient's case was discussed in multidisciplinary team meeting.   - Overnight events to report per chart review: Patient refusing medications, felt that she was being administered medications due to being homeless.  - Patient received all scheduled medications besides metformin.  - Patient received the following PRN medications: ativan '1mg'$  '@1440'$   Information Obtained Today During Patient Interview: The patient was seen and evaluated on the unit. On assessment today the patient is laying in bed. She reports her sleep is good. She reports decreased appetite yesterday, she denies nausea, vomiting. She reports she is waiting for Friday to see her partner. She denies SI/HI/AVH. She denies paranoia or delusions. She is asking for referral to clothing in [place]. She is amenable to LAI tomorrow. She denies abdominal pain. Reports regular BM.   On repeat examination, asked patient about prior delusions and patient denied stating "that was a year ago. I'm not like that now." This interviewer attempted to elicit additional information  from the patient however the patient reported "I don't ever want to talk to you again." Asked patient if there was anything else to help with and patient denied.   Past Psychiatric History: bipolar disorder  Past Medical History:  Past Medical History:  Diagnosis Date   Bipolar affective disorder, currently manic, mild ([place])    Diabetes mellitus without complication ([place])    Schizophrenia ([place])    Family History:  Family History  Problem Relation Age of Onset   Drug abuse Maternal Uncle    Family Psychiatric History: None Social History: No tobacco or alcohol use per chart review   Current Medications: Current Facility-Administered Medications  Medication Dose Route Frequency Provider Last Rate Last Admin   acetaminophen (TYLENOL) tablet 650 mg  650 mg Oral Q6H PRN Leevy-Johnson, Brooke A, NP       alum & mag hydroxide-simeth (MAALOX/MYLANTA) 200-200-20 MG/5ML suspension 30 mL  30 mL Oral Q4H PRN Leevy-Johnson, Brooke A, NP   30 mL at 10/22/21 2200   chlorproMAZINE (THORAZINE) injection 25 mg  25 mg Intramuscular Q6H PRN Massengill, Ovid Curd, MD   25 mg at 10/23/21 1301   divalproex (DEPAKOTE ER) 24 hr tablet 1,000 mg  1,000 mg Oral QHS Camelia Phenes, MD   1,000 mg at 11/05/21 2048   hydrOXYzine (ATARAX) tablet 25 mg  25 mg Oral TID PRN Janine Limbo, MD   25 mg at 10/24/21 2053   LORazepam (ATIVAN) injection 1 mg  1 mg Intramuscular Q6H PRN Massengill, Ovid Curd, MD   1 mg at 10/23/21 1300   LORazepam (ATIVAN) tablet 1 mg  1 mg Oral Q6H PRN Massengill, Ovid Curd, MD   1 mg at 11/05/21 1440   losartan (COZAAR) tablet  50 mg  50 mg Oral Daily Leevy-Johnson, Brooke A, NP   50 mg at 11/06/21 3570   magnesium hydroxide (MILK OF MAGNESIA) suspension 30 mL  30 mL Oral Daily PRN Leevy-Johnson, Brooke A, NP   30 mL at 10/28/21 0243   menthol-cetylpyridinium (CEPACOL) lozenge 3 mg  1 lozenge Oral PRN Evette Georges, NP   3 mg at 10/28/21 0915   metFORMIN (GLUCOPHAGE-XR) 24 hr tablet 500 mg  500 mg  Oral BID WC Leevy-Johnson, Brooke A, NP   500 mg at 11/06/21 1779   metoprolol succinate (TOPROL-XL) 24 hr tablet 25 mg  25 mg Oral Al Corpus, MD   25 mg at 11/05/21 2048   [START ON 11/07/2021] paliperidone (INVEGA SUSTENNA) injection 156 mg  156 mg Intramuscular Once Massengill, Ovid Curd, MD       QUEtiapine (SEROQUEL XR) 24 hr tablet 400 mg  400 mg Oral QHS Merrily Brittle, DO   400 mg at 11/05/21 2108   QUEtiapine (SEROQUEL) tablet 200 mg  200 mg Oral Q6H PRN Janine Limbo, MD   200 mg at 10/23/21 3903   senna-docusate (Senokot-S) tablet 1 tablet  1 tablet Oral Daily Merrily Brittle, DO   1 tablet at 11/06/21 0092    Lab Results:  Results for orders placed or performed during the hospital encounter of 10/22/21 (from the past 48 hour(s))  Valproic acid level     Status: None   Collection Time: 11/06/21  6:45 AM  Result Value Ref Range   Valproic Acid Lvl 86 50.0 - 100.0 ug/mL    Comment: Performed at [facility], [place] [address], [place], [place] [address]    Blood Alcohol level:  Lab Results  Component Value Date   [facility] <10 10/20/2021   ETH <10 62/26/3335    Metabolic Labs: Lab Results  Component Value Date   HGBA1C 5.2 10/20/2021   MPG 102.54 10/20/2021   MPG 108.28 03/08/2021   Lab Results  Component Value Date   PROLACTIN 15.7 10/20/2021   PROLACTIN 46.5 (H) 07/25/2015   Lab Results  Component Value Date   CHOL 180 10/20/2021   TRIG 14 10/20/2021   HDL 71 10/20/2021   CHOLHDL 2.5 10/20/2021   VLDL 3 10/20/2021   LDLCALC 106 (H) 10/20/2021   LDLCALC 103 (H) 03/08/2021    Sleep: 9 hours  Physical Findings: Did not assess due to patient preference   Mental Status Exam:  Appearance and Grooming: Patient is laying in bed with sheet covering most of her body. She requests that the lights be turned off during interview. The patient has no noticeable scent or odor.  Behavior: The patient appears in no acute distress, and during the  interview, was calm, required minimal redirection, and behaving appropriately to scenario; she was able to follow commands and made good eye contact. On repeat examination around 11am, patient laying on side in bed and unwilling to engage in interview.  The patient did not appear internally or externally preoccupied.  Attitude: Patient's attitude towards the interviewer was initially cooperative and open. On repeat examination, patient was suspicious "I don't want to talk to you ever again."   Motor activity: The patient's movement speed was normal; her gait was not observed during encounter. There was no notable abnormal facial movements and no notable abnormal extremity movements.  Speech: The patient's speech was fluent and with poor articulation. The volume of her speech was soft and normal in quantity. The rate was normal with a  normal rhythm. Responses were normal in latency. There were no abnormal patterns in speech. Mumbling noted throughout interview.   Mood: "Good"  Affect: Patient's affect is calm with broad range and even fluctuations; her affect is congruent with her stated mood. -------------------------------------------------------------------------------------------------------------------------  Thought Content The patient experiences no hallucinations. The patient describes no delusional thoughts; she denies thought insertion, denies thought withdrawal, denies thought interruption, and denies thought broadcasting.  Patient at the time of interview denies active suicidal intent and denies passive suicidal ideation; she denies homicidal intent.  Thought Process The patient's thought process is linear.  Insight The patient at the time of interview demonstrates poor insight, as evidenced by understanding of mental health condition/s and lacking understanding of mental health condition/s.  Judgement The patient over the past 24 hours demonstrates poor judgement, as  evidenced by unwillingness to voluntarily seek help / treatment and minimally engaging with staff / other patients.  Physical Exam Constitutional:      Appearance: the patient is not toxic-appearing.  Pulmonary:     Effort: Pulmonary effort is normal.  Neurological:     General: No focal deficit present.     Mental Status: the patient is alert and oriented to person, place  Review of Systems  Respiratory:  Negative for shortness of breath.   Cardiovascular:  Negative for chest pain.  Gastrointestinal:  Negative for abdominal pain, constipation, diarrhea, nausea and vomiting.  Neurological:  Negative for headaches.    Blood pressure 102/68, pulse 98, temperature (!) 97.5 F (36.4 C), temperature source Oral, resp. rate 20, height '5\' 4"'$  (1.626 m), weight 89.3 kg, SpO2 98 %, unknown if currently breastfeeding. Body mass index is 33.78 kg/m.  Assets  Assets: Armed forces logistics/support/administrative officer; Desire for Improvement  Treatment Plan Summary: Daily contact with patient to assess and evaluate symptoms and progress in treatment and Medication management  Diagnoses / Active Problems: Schizoaffective disorder, bipolar type ([place]) Principal Problem:   Schizoaffective disorder, bipolar type ([place]) Active Problems:   Manic behavior ([place])   Delusional disorder ([place])  Assessment  [name] [name] is a 41 y.o. female with past psychiatric history significant for schizoaffective disorder bipolar type and with 12 ED visits over the past 6 months for manic behavior who was admitted to [facility] under IVC for acute manic symptoms with psychosis.   PLAN: Safety and Monitoring:  -- Involuntary admission to inpatient psychiatric unit for safety, stabilization and treatment  -- Daily contact with patient to assess and evaluate symptoms and progress in treatment  -- Patient's case to be discussed in multi-disciplinary team meeting  -- Observation Level : q15 minute checks  -- Vital signs:  q12 hours  --  Precautions: suicide, elopement, and assault  2. Medications:   #Schizoaffective disorder, bipolar type  VPA Trough level 86. She was continued on seroquel for sleep, mood stabilization, and treatment of psychosis. She will benefit from receiving IM paliperidone injection prior to discharge to reduce risk of re-admission.  -Continue quetiapine XL 400 mg QHS for sleep, mood stabilization, and treatment of psychosis. -Received paliperidone 234 mg IM 10/29. Next dose 156 mg on 11/07/2021, then followed by 234 mg every 4 weeks thereafter - would benefit from staying inpatient until receiving booster LAI -Continue divalproex 1000 mg ER QHS for mood stabilization   #Type 2 DM She was continued on metformin '500mg'$  BID   #HTN She was continued on losartan '50mg'$   #Oral thrush  White thrush noted on tongue. She was started on nystatin swish and swallow 10/24-11/1.  Tongue without lesions or white patches when evaluated this afternoon. -will discontinue nystatin given she has had 7 day course with noted mild disease initially.   #Tachycardia  She was initially started on propranolol '10mg'$  Q12H, then transitioned to metoprolol succinate 25 QHS.    -- Patient does not need nicotine replacement  The risks/benefits/side-effects/alternatives to the above medication were discussed in detail with the patient and time was given for questions. The patient consents to medication trial. FDA black box warnings, if present, were discussed.  The patient is agreeable with the medication plan, as above. We will monitor the patient's response to pharmacologic treatment, and adjust medications as necessary.  3. Routine and other pertinent labs:             -- Metabolic profile:  BMI: Body mass index is 33.78 kg/m.  Prolactin: Lab Results  Component Value Date   PROLACTIN 15.7 10/20/2021   PROLACTIN 46.5 (H) 07/25/2015    Lipid Panel: Lab Results  Component Value Date   CHOL 180 10/20/2021   TRIG 14  10/20/2021   HDL 71 10/20/2021   CHOLHDL 2.5 10/20/2021   VLDL 3 10/20/2021   LDLCALC 106 (H) 10/20/2021   LDLCALC 103 (H) 03/08/2021    HbgA1c: Hgb A1c MFr Bld (%)  Date Value  10/20/2021 5.2    TSH: TSH (uIU/mL)  Date Value  10/20/2021 1.121    EKG monitoring: QTc: pending  4. Group Therapy:  -- Encouraged patient to participate in unit milieu and in scheduled group therapies   -- Short Term Goals: Ability to identify changes in lifestyle to reduce recurrence of condition will improve, Ability to verbalize feelings will improve, and Ability to disclose and discuss suicidal ideas  -- Long Term Goals: Improvement in symptoms so as ready for discharge -- Patient is encouraged to participate in group therapy while admitted to the psychiatric unit. -- We will address other chronic and acute stressors, which contributed to the patient's Schizoaffective disorder, bipolar type ([place]) in order to reduce the risk of self-harm at discharge.  5. Discharge Planning:   -- Social work and case management to assist with discharge planning and identification of hospital follow-up needs prior to discharge  -- Estimated LOS: 5-7 days  -- Discharge Concerns: Need to establish a safety plan; Medication compliance and effectiveness  -- Discharge Goals: Return home with outpatient referrals for mental health follow-up including medication management/psychotherapy  I certify that inpatient services furnished can reasonably be expected to improve the patient's condition.    I discussed my assessment, planned testing and intervention for the patient with Dr. Caswell Corwin who agrees with my formulated course of action.  Rolanda Lundborg, MD, PGY-1 11/06/2021, 11:55 AM

## 2021-11-05 NOTE — Group Note (Signed)
Recreation Therapy Group Note   Group Topic:Healthy Decision Making  Group Date: 11/05/2021 Start Time: 1000 End Time: 1021 Facilitators: Saranne Crislip-McCall, LRT,CTRS Location: [address] Dayroom   Goal Area(s) Addresses:  Patient will effectively work with peer towards shared goal.  Patient will identify factors that guided their decision making.  Patient will pro-socially communicate ideas during group session.    Group Description:  Patients were given a scenario that they were going to be stranded on a deserted [place] for several months before being rescued. Writer tasked them with making a list of 15 things they would choose to bring with them for "survival". The list of items was prioritized most important to least. Each patient would come up with their own list, then work together to create a new list of 15 items while in a group of 3-5 peers. LRT discussed each person's list and how it differed from others. The debrief included discussion of priorities, good decisions versus bad decisions, and how it is important to think before acting so we can make the best decision possible. LRT tied the concept of effective communication among group members to patient's support systems outside of the hospital and its benefit post discharge.   Affect/Mood: N/A   Participation Level: Did not attend    Clinical Observations/Individualized Feedback:     Plan: Continue to engage patient in RT group sessions 2-3x/week.   Chet Greenley-McCall, LRT,CTRS 11/05/2021 12:20 PM

## 2021-11-06 DIAGNOSIS — F319 Bipolar disorder, unspecified: Secondary | ICD-10-CM | POA: Diagnosis not present

## 2021-11-06 LAB — VALPROIC ACID LEVEL: Valproic Acid Lvl: 86 ug/mL (ref 50.0–100.0)

## 2021-11-06 MED ORDER — PALIPERIDONE PALMITATE ER 156 MG/ML IM SUSY
156.0000 mg | PREFILLED_SYRINGE | Freq: Once | INTRAMUSCULAR | Status: AC
  Administered 2021-11-07: 156 mg via INTRAMUSCULAR

## 2021-11-06 NOTE — BHH Counselor (Signed)
BHH/BMU LCSW Progress Note   11/06/2021    3:01 PM  Cathye Kreiter   [id number]   Type of Contact and Topic:   collateral support  CSW spoke with Shawn who reports that he no longer will be able to pick patient up on Friday because he was in an accident.  Raquel Sarna states that patient can return back to her place of residence.  CSW explained that we have been unable to confirm this but would keep trying to call the person that owns the house.     Signed:  Riki Altes MSW, LCSW, LCAS 11/06/2021 3:01 PM

## 2021-11-06 NOTE — Group Note (Signed)
Recreation Therapy Group Note   Group Topic:Personal Development  Group Date: 11/06/2021 Start Time: 7711 End Time: 1435 Facilitators: Analissa Bayless-McCall, LRT,CTRS Location: 300 [address] Dayroom   Activity Description/Intervention: Therapeutic Drumming. Patients with peers and staff were given the opportunity to engage in a leader facilitated [place] with staff from the [employer], in partnership with [employer]. Nurse, adult and trained [employer], Devin Going leading with LRT observing and documenting intervention and pt response. This evidenced-based practice targets 7 areas of health and wellbeing in the human experience including: stress-reduction, exercise, self-expression, camaraderie/support, nurturing, spirituality, and music-making (leisure).   Goal Area(s) Addresses:  Patient will engage in pro-social way in music group.  Patient will follow directions of drum leader on the first prompt. Patient will demonstrate no behavioral issues during group.  Patient will identify if a reduction in stress level occurs as a result of participation in therapeutic drum circle.     Affect/Mood: Appropriate   Participation Level: Engaged   Participation Quality: Independent   Behavior: Appropriate   Speech/Thought Process: Focused    Clinical Observations/Individualized Feedback:  Patient actively engaged in therapeutic drumming exercise and discussions. Pt was appropriate with peers, staff, and musical equipment for duration of programming.   Plan: Continue to engage patient in RT group sessions 2-3x/week.   Everette Mall-McCall, LRT,CTRS 11/06/2021 3:45 PM

## 2021-11-06 NOTE — BHH Counselor (Signed)
BHH/BMU LCSW Progress Note   11/06/2021    10:40 AM  [name] [name]   [id number]   Type of Contact and Topic:  Discharge planning and confirming pick up  CSW spoke with patient support, Shawn, to confirm pick up for Friday.  Raquel Sarna states that he was not in a position to speak but that he wasn't in a position to speak.  Raquel Sarna states that he will call this CSW back.     Signed:  Riki Altes MSW, LCSW, LCAS 11/06/2021 10:40 AM

## 2021-11-06 NOTE — Progress Notes (Signed)
Pt did not attend orientation group despite multiple prompts.

## 2021-11-06 NOTE — Progress Notes (Signed)
   11/06/21 0536  Sleep  Number of Hours 9

## 2021-11-06 NOTE — Progress Notes (Signed)
Pt in bathroom, did not attend scheduled orientation group despite multiple prompts.

## 2021-11-06 NOTE — Group Note (Signed)
   Date/Time: 11/1 @ 1pm  Type of Therapy and Topic:  Group Therapy:  triggers  Participation Level:   minimal  Description of Group:   Recognizing Triggers: Patients defined triggers and discussed the importance of recognizing their personal warning signs. Patients identified their own triggers and how they tend to cope with stressful situations. Patients discussed areas such as people, places, things, and thoughts that rigger certain emotions for them. CSW provided support to patients and discussed safety planning for when these triggers occur. Group participants had opportunities to share openly with the group and participate in a group discussion while providing support and feedback to their peers.  Therapeutic Goals: Patient will identify triggers that are contributing to a problem in their life Patient will identify unwanted behaviors and feelings associated with a trigger.  Patient will share with other group members strategies to confront and avoid triggers so that they may be able to react appropriately to triggers in daily life.    Summary of Patient Progress: Pt participated appropriately in group.  Patient was minimal but participated when called on.  She reports that she has worries around conflict with her relationship.  Patient states that she uses prayer to cope with it.       Therapeutic Modalities:   Cognitive Behavioral Therapy Solution Focused Therapy Motivational Interviewing Family Systems Approach   [name] Sula [place], LCSW, [name] Social Worker  [facility]

## 2021-11-06 NOTE — Progress Notes (Signed)
D- Patient alert and oriented. Denies SI, HI, AVH, and pain. Patient rates anxiety and depression as a 0/10 for each. Patient presents with a pleasant mood interacted with staff. Patient did not attend group, but remained in her room resting.  A- Scheduled medications administered to patient, MAR. Support and encouragement provided.  Routine safety checks conducted every 15 minutes.  Patient informed to notify staff with problems or concerns.  R- No adverse drug reactions noted. Patient contracts for safety at this time. Patient compliant with medications and treatment plan. Patient receptive, calm, and cooperative. Patient interacts well with others on the unit.  Patient remains safe at this time.

## 2021-11-06 NOTE — Progress Notes (Signed)
Pt concerned about where she will be going at D/C. Pt stated she knows someone from [place], but has not known him for long and does not trust him , but may have to go with him because she has nowhere else to go near by. Pt stated she has an Aunt she can stay with in [place] if she got a ride there. Pt encouraged to talk to SW about possible bus ticket to [facility]. Pt visible on the unit some this evening , pleasant and appropriate    11/06/21 2230  Psych Admission Type (Psych Patients Only)  Admission Status Involuntary  Psychosocial Assessment  Patient Complaints Worrying  Eye Contact Fair  Facial Expression Flat;Worried  Affect Anxious  Speech Logical/coherent  Interaction Cautious  Motor Activity Slow  Appearance/Hygiene Unremarkable  Behavior Characteristics Cooperative  Mood Pleasant  Aggressive Behavior  Effect No apparent injury  Thought Process  Coherency WDL  Content WDL  Delusions None reported or observed  Perception WDL  Hallucination None reported or observed  Judgment WDL  Confusion None  Danger to Self  Current suicidal ideation? Denies  Danger to Others  Danger to Others None reported or observed  Danger to Others Abnormal  Harmful Behavior to others No threats or harm toward other people

## 2021-11-06 NOTE — Group Note (Signed)
Recreation Therapy Group Note   Group Topic:Problem Solving  Group Date: 11/06/2021 Start Time: 1005 End Time: 1038 Facilitators: Takeira Yanes-McCall, LRT,CTRS Location: [address] Dayroom   Goal Area(s) Addresses:  Patient will effectively work with peer towards shared goal.  Patient will identify skills used to make activity successful.  Patient will share challenges and verbalize solution-driven approaches used. Patient will identify how skills used during activity can be used to reach post d/c goals.   Group Description: [employer]. Patients were provided the following materials: 5 drinking straws, 5 rubber bands, 5 paper clips, 2 index cards and 2 drinking cups.  Using the provided materials patients were asked to build a launching mechanism to launch a ping pong ball across the room, approximately 10 feet. Patients were divided into teams of 3-5. Instructions required all materials be incorporated into the device, functionality of items left to the peer group's discretion.   Affect/Mood: N/A   Participation Level: Did not attend    Clinical Observations/Individualized Feedback:     Plan: Continue to engage patient in RT group sessions 2-3x/week.   Nelia Rogoff-McCall, LRT,CTRS  11/06/2021 11:56 AM

## 2021-11-07 DIAGNOSIS — F319 Bipolar disorder, unspecified: Secondary | ICD-10-CM | POA: Diagnosis not present

## 2021-11-07 NOTE — Progress Notes (Signed)
Adult Psychoeducational Group Note  Date:  11/07/2021 Time:  8:49 PM  Group Topic/Focus:  Wrap-Up Group:   The focus of this group is to help patients review their daily goal of treatment and discuss progress on daily workbooks.  Participation Level:  Active  Participation Quality:  Appropriate  Affect:  Appropriate  Cognitive:  Appropriate  Insight: Appropriate  Engagement in Group:  Engaged  Modes of Intervention:  Discussion and Support  Additional Comments:   Pt attended and participated in the [place] group. Pt rated her day 7/10. Pt's goal for today was to get her medicaid/medicare card. Pt was not able to make any progress toward goal despite calling today. Pt shared that she practices deep breathing and visualization to help her cope. Pt expressed concern regarding her transportation home once discharged. Pt was receptive to the suggestion of talking to her assigned CSW regarding transportation.  Wetzel Bjornstad Juniel Groene 11/07/2021, 8:49 PM
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
## 2021-11-07 NOTE — Progress Notes (Signed)
[facility] MD Resident Progress Note  11/07/2021 6:53 AM [name]  MRN:  [id number] Principal Problem: Schizoaffective disorder, bipolar type ([place]) Diagnosis: Principal Problem:   Schizoaffective disorder, bipolar type ([place]) Active Problems:   Manic behavior ([place])   Delusional disorder ([place])  Reason for admission   [name] [name] is a 41 y.o. female with past psychiatric history significant for schizoaffective disorder bipolar type and with 12 ED visits over the past 6 months for manic behavior who presents to the [facility] requesting medication refills in the setting of reported medication noncompliance who was subsequently transferred to [facility] under IVC for acute manic symptoms with psychosis (admitted on 10/22/2021, total  LOS: 16 days )  Chart review from last 24 hours   The patient's chart was reviewed and nursing notes were reviewed. The patient's case was discussed in multidisciplinary team meeting.  - Overnight events per chart review: Did not attend morning group. Attended afternoon group, reported worries around conflict in her relationship. Shawn unable to pick up patient due to being in an accident. Patient was concerned about her discharge.  - Patient received all scheduled meds  - Patient did not receive any PRN meds   Information obtained during interview   The patient was seen and evaluated on the unit. On assessment today the patient did not talk to this writer initially and was lying in bed. Was only nodding yes/no to questions. She endorsed good sleep overnight. She endorsed eating dinner yesterday. She denied any chest pain, SOB, abdominal pain, N/V, HA, dizziness. She denied any side effects to the medications. She was willing to get her [employer] today. Asked about her feelings with regards to discharge and she states "we have already talked about it for the past couple of weeks. I don't want to talk about it anymore." She denied  paranoia, auditory or visual hallucinations. She endorses feeling safe in the hospital. She denies SI/HI.   Review of Systems  Respiratory:  Negative for shortness of breath.   Cardiovascular:  Negative for chest pain.  Gastrointestinal:  Negative for abdominal pain, constipation, diarrhea, nausea and vomiting.  Neurological:  Negative for headaches.   Objective   Blood pressure 104/73, pulse (!) 103, temperature (!) 97.5 F (36.4 C), temperature source Oral, resp. rate 20, height '5\' 4"'$  (1.626 m), weight 89.3 kg, SpO2 100 %, unknown if currently breastfeeding. Body mass index is 33.78 kg/m.  Sleep: 8 hours Current Medications: Depakote 1000 QHS  Losartan 50 daily  Metformin 500 BID  Metoprolol 25 QHS  Invega sustenna (due today) Seroquel 400 QHS  Senna 1 tab daily  Labs: Depakote level 86 (11/1) EKG: sinus tachycardia, Qtc 405     Physical Exam Constitutional:      Appearance: the patient is not toxic-appearing.  Pulmonary:     Effort: Pulmonary effort is normal.  Neurological:     General: No focal deficit present.     Mental Status: the patient is alert and oriented to person, place, and time.  AIMS:  Facial and Oral Movements Muscles of Facial Expression: None, normal Lips and Perioral Area: None, normal Jaw: None, normal Tongue: None, normal,Extremity Movements Upper (arms, wrists, hands, fingers): None, normal Lower (legs, knees, ankles, toes): None, normal Trunk Movements: None, normal  Neck, shoulders, hips: None, normal Overall Severity Severity of abnormal movements (highest score from questions above): None, normal Incapacitation due to abnormal movements: None, normal Patient's awareness of abnormal movements (rate only patient's report): No Awareness, Dental Status  Current problems with teeth and/or dentures?: No Does patient usually wear dentures?: No   No stiffness, cogwheeling, or tremors noted on exam.   Mental Status Exam   Appearance and  Grooming: Patient is  covered with blanket, lying in bed  with hair on pillow. The patient has no noticeable scent or odor. Motor activity: Gait was not observed during encounter. There was no notable abnormal facial movements and no notable abnormal extremity movements. Behavior: The patient appears in no acute distress, and during the interview, was calm, refusing to talk, and required minimal redirection; she was able to follow commands and made minimal eye contact. The patient did not appear internally or externally preoccupied. Attitude: Patient's attitude towards the interviewer was initially uncooperative, as evidenced by unwillingness to speak with this examiner but eventually did answer questions . Speech: The patient was minimally speaking during the interview. When she did speak, the patient's speech was fluent. The volume of her speech was normal and normal in quantity. The rate was normal with a normal rhythm. Responses were normal in latency. There were no abnormal patterns in speech. Mood: "Don't want to talk about it" Affect: Patient's affect is irritable with broad range and even fluctuations; her affect is congruent with her stated mood. Appropriate affect given change in her disposition plan.  ------------------------------------------------------------------------------------------------------------------------- Thought Content The patient experiences no hallucinations. The patient describes no delusional thoughts; she denies thought insertion, denies thought withdrawal, denies thought interruption, and denies thought broadcasting. Patient at the time of interview denies active suicidal intent and denies passive suicidal ideation; she denies homicidal intent. Thought Process The patient's thought process is linear.  Insight The patient at the time of interview demonstrates poor insight, as evidenced by understanding of mental health condition/s, ability to identify trigger/s  causing mental health decompensation, and inability to identify adaptive and maladaptive coping strategies. Judgement The patient over the past 24 hours demonstrates poor judgement, as evidenced by minimally engaging with staff / other patients. However, patient is compliant with scheduled medications.   Assets: Armed forces logistics/support/administrative officer; Desire for Improvement  Treatment Plan Summary: Daily contact with patient to assess and evaluate symptoms and progress in treatment and Medication management Diagnoses / Active Problems: Schizoaffective disorder, bipolar type ([place]) Principal Problem:   Schizoaffective disorder, bipolar type ([place]) Active Problems:   Manic behavior ([place])   Delusional disorder ([place])  Summary   [name] [name] is a 41 y.o. female with past psychiatric history significant for schizoaffective disorder bipolar type and with 12 ED visits over the past 6 months for manic behavior who was admitted to [facility] under IVC for acute manic symptoms with psychosis.   Plan  Safety and Monitoring: -- Involuntary admission to inpatient psychiatric unit for safety, stabilization and treatment -- Daily contact with patient to assess and evaluate symptoms and progress in treatment -- Patient's case to be discussed in multi-disciplinary team meeting -- Observation Level : q15 minute checks -- Vital signs:  q12 hours -- Precautions: suicide, elopement, and assault  2. Medications:  #Schizoaffective disorder, bipolar type  She will receive IM paliperidone injection prior to discharge to reduce risk of re-admission.  -Continue quetiapine XL 400 mg QHS for sleep, mood stabilization, and treatment of psychosis. -Received 156 mg today, then followed by 234 mg every 4 weeks thereafter - would benefit from staying inpatient until receiving booster LAI -Continue divalproex 1000 mg ER QHS for mood stabilization    #Type 2 DM She was continued on metformin '500mg'$  BID    #  HTN She was continued on  losartan '50mg'$    #Tachycardia  She was initially started on propranolol '10mg'$  Q12H, then transitioned to metoprolol succinate 25 QHS.     -- Patient does not need nicotine replacement   The risks/benefits/side-effects/alternatives to the above medication were discussed in detail with the patient and time was given for questions. The patient consents to medication trial. FDA black box warnings, if present, were discussed.   The patient is agreeable with the medication plan, as above. We will monitor the patient's response to pharmacologic treatment, and adjust medications as necessary.  3. Routine and other pertinent labs: EKG monitoring: QTc: 405  Lab Results:     Latest Ref Rng & Units 10/20/2021   11:35 AM 03/08/2021   11:35 PM 02/17/2021    1:28 PM  CBC  WBC 4.0 - 10.5 K/uL 6.0  6.9  7.3   Hemoglobin 12.0 - 15.0 g/dL 11.7  11.7  12.7   Hematocrit 36.0 - 46.0 % 34.0  33.8  37.0   Platelets 150 - 400 K/uL 241  355  227       Latest Ref Rng & Units 10/20/2021   11:35 AM 03/08/2021   11:35 PM 02/17/2021    1:28 PM  BMP  Glucose 70 - 99 mg/dL 99  125  133   BUN 6 - 20 mg/dL '11  11  19   '$ Creatinine 0.44 - 1.00 mg/dL 0.90  0.83  1.06   Sodium 135 - 145 mmol/L 138  139  139   Potassium 3.5 - 5.1 mmol/L 3.9  3.4  3.4   Chloride 98 - 111 mmol/L 103  106  106   CO2 22 - 32 mmol/L '24  23  18   '$ Calcium 8.9 - 10.3 mg/dL 9.6  9.2  9.4    Blood Alcohol level:  Lab Results  Component Value Date   ETH <10 10/20/2021   ETH <10 11/27/2020   Prolactin: Lab Results  Component Value Date   PROLACTIN 15.7 10/20/2021   PROLACTIN 46.5 (H) 07/25/2015   Lipid Panel: Lab Results  Component Value Date   CHOL 180 10/20/2021   TRIG 14 10/20/2021   HDL 71 10/20/2021   CHOLHDL 2.5 10/20/2021   VLDL 3 10/20/2021   LDLCALC 106 (H) 10/20/2021   LDLCALC 103 (H) 03/08/2021   HbgA1c: Lab Results  Component Value Date   HGBA1C 5.2 10/20/2021   TSH: Lab Results  Component Value Date   TSH  1.121 10/20/2021    4. Group Therapy: -- Encouraged patient to participate in unit milieu and in scheduled group therapies  -- Short Term Goals: Ability to identify changes in lifestyle to reduce recurrence of condition will improve, Ability to verbalize feelings will improve, Ability to disclose and discuss suicidal ideas, Ability to identify and develop effective coping behaviors will improve, Compliance with prescribed medications will improve, and Ability to identify triggers associated with substance abuse/mental health issues will improve -- Long Term Goals: Improvement in symptoms so as ready for discharge -- Patient is encouraged to participate in group therapy while admitted to the psychiatric unit. -- We will address other chronic and acute stressors, which contributed to the patient's Schizoaffective disorder, bipolar type ([place]) in order to reduce the risk of self-harm at discharge.  5. Discharge Planning:  -- Social work and case management to assist with discharge planning and identification of hospital follow-up needs prior to discharge -- Estimated LOS: 5-7 days -- Discharge Concerns: Need to  establish a safety plan; Medication compliance and effectiveness. Patient's prior contact will be unable to pick patient up. Plan for patient to be discharged tomorrow. Either to [facility] or [facility]. Will call [place] tomorrow to see if there is bed for patient.  -- Discharge Goals: Return home with outpatient referrals for mental health follow-up including medication management/psychotherapy  I certify that inpatient services furnished can reasonably be expected to improve the patient's condition.   I discussed my assessment, planned testing and intervention for the patient with Dr. Caswell Corwin who agrees with my formulated course of action.  Rolanda Lundborg, MD, PGY-1 11/07/2021, 6:53 AM

## 2021-11-07 NOTE — Progress Notes (Signed)
   11/07/21 1000  Psych Admission Type (Psych Patients Only)  Admission Status Involuntary  Psychosocial Assessment  Patient Complaints Worrying  Eye Contact Fair  Facial Expression Flat;Worried  Affect Appropriate to circumstance  Speech Logical/coherent  Interaction Avoidant;Cautious  Motor Activity Slow  Appearance/Hygiene Unremarkable  Behavior Characteristics Cooperative  Mood Pleasant  Aggressive Behavior  Effect No apparent injury  Thought Process  Coherency WDL  Content WDL  Delusions None reported or observed  Perception WDL  Hallucination None reported or observed  Judgment WDL  Confusion None  Danger to Self  Current suicidal ideation? Denies  Danger to Others  Danger to Others None reported or observed  Danger to Others Abnormal  Harmful Behavior to others No threats or harm toward other people  Destructive Behavior No threats or harm toward property

## 2021-11-07 NOTE — BHH Counselor (Signed)
BHH/BMU LCSW Progress Note   11/07/2021    11:04 AM  [name] [name]   [id number]   Type of Contact and Topic:  Discharge Planning  CSW spoke with patient regarding discharging to [facility].  Patient states that her support, Raquel Sarna, told her he was going to get a rental car to pick patient up.  CSW agreed to call Raquel Sarna again but that we would have to send to [facility] tomorrow since we have been unable to get in contact with other contacts to return to the place she was staying or picking her up [place] the [employer] [place].     Signed:  Riki Altes MSW, LCSW, LCAS 11/07/2021 11:04 AM

## 2021-11-07 NOTE — Progress Notes (Signed)
   11/07/21 2215  Psych Admission Type (Psych Patients Only)  Admission Status Involuntary  Psychosocial Assessment  Patient Complaints Worrying  Eye Contact Fair  Facial Expression Flat;Worried  Affect Anxious  Speech Logical/coherent  Interaction Cautious  Motor Activity Slow  Appearance/Hygiene Unremarkable  Behavior Characteristics Cooperative  Mood Pleasant  Aggressive Behavior  Effect No apparent injury  Thought Process  Coherency WDL  Content WDL  Delusions None reported or observed  Perception WDL  Hallucination None reported or observed  Judgment WDL  Confusion None  Danger to Self  Current suicidal ideation? Denies  Danger to Others  Danger to Others None reported or observed  Danger to Others Abnormal  Harmful Behavior to others No threats or harm toward other people

## 2021-11-07 NOTE — Progress Notes (Signed)
   11/07/21 0515  Sleep  Number of Hours 8

## 2021-11-07 NOTE — Group Note (Signed)
Recreation Therapy Group Note   Group Topic:Coping Skills  Group Date: 11/07/2021 Start Time: 1000 End Time: 1027 Facilitators: Yaseen Gilberg-McCall, LRT,CTRS Location: [address] Dayroom   Goal Area(s) Addresses: Patient will define what a coping skill is. Patient will successfully identify positive coping skills they can use post d/c.  Patient will acknowledge benefit(s) of using learned coping skills post d/c.  Group Description:  Coping A to Z. Patient asked to identify what a coping skill is and when they use them. Patients with Probation officer discussed healthy versus unhealthy coping skills. Next patients were given a blank worksheet titled "Coping Skills A-Z". Patients were instructed to come up with at least one positive coping skill per letter of the alphabet, addressing a specific challenge (ex: stress, anger, anxiety, depression, grief, doubt, isolation, self-harm/suicidal thoughts, substance use). Patients were given 15 minutes to brainstorm before ideas were presented to the large group. Patients and LRT debriefed on the importance of coping skill selection based on situation and back-up plans when a skill tried is not effective. At the end of group, patients were given an handout of alphabetized strategies to keep for future reference.   Affect/Mood: Appropriate   Participation Level: Engaged   Participation Quality: Independent   Behavior: Appropriate   Speech/Thought Process: Focused   Insight: Moderate   Judgement: Moderate   Modes of Intervention: Worksheet   Patient Response to Interventions:  Engaged   Education Outcome:  Acknowledges education and In group clarification offered    Clinical Observations/Individualized Feedback: Pt came into group a little late but was able to catch up with the assignment.  Pt was quiet but focused on her sheet. Pt was to come up with coping skills for isolation.  Pt identified alter locations, believe in self, embrace friends,  fish, give hugs, joke, make others smile, pet dog, read, squeeze her kids, watch television, release negative people, hunt and fly a kite as coping skills for isolation.     Plan: Continue to engage patient in RT group sessions 2-3x/week.   Apoorva Bugay-McCall, LRT,CTRS 11/07/2021 11:39 AM

## 2021-11-08 DIAGNOSIS — F319 Bipolar disorder, unspecified: Secondary | ICD-10-CM

## 2021-11-08 MED ORDER — METOPROLOL SUCCINATE ER 25 MG PO TB24: 25.0000 mg | ORAL_TABLET | Freq: Every day | ORAL | 0 refills | Status: DC

## 2021-11-08 MED ORDER — METFORMIN HCL ER 500 MG PO TB24: 500.0000 mg | ORAL_TABLET | Freq: Two times a day (BID) | ORAL | 0 refills | Status: DC

## 2021-11-08 MED ORDER — PALIPERIDONE PALMITATE ER 234 MG/1.5ML IM SUSY: 234.0000 mg | PREFILLED_SYRINGE | Freq: Once | INTRAMUSCULAR | Status: DC

## 2021-11-08 MED ORDER — DIVALPROEX SODIUM ER 500 MG PO TB24: 1000.0000 mg | ORAL_TABLET | Freq: Every day | ORAL | 0 refills | Status: DC

## 2021-11-08 MED ORDER — QUETIAPINE FUMARATE ER 400 MG PO TB24: 400.0000 mg | ORAL_TABLET | Freq: Every day | ORAL | 0 refills | Status: DC

## 2021-11-08 MED ORDER — PALIPERIDONE PALMITATE ER 234 MG/1.5ML IM SUSY: 234.0000 mg | PREFILLED_SYRINGE | Freq: Once | INTRAMUSCULAR | 0 refills | Status: DC

## 2021-11-08 MED ORDER — BENZTROPINE MESYLATE 1 MG PO TABS: 1.0000 mg | ORAL_TABLET | Freq: Two times a day (BID) | ORAL | 0 refills | Status: DC

## 2021-11-08 MED ORDER — LOSARTAN POTASSIUM 50 MG PO TABS: 50.0000 mg | ORAL_TABLET | Freq: Every day | ORAL | 0 refills | Status: DC

## 2021-11-08 NOTE — Progress Notes (Signed)
   11/08/21 0900  Psych Admission Type (Psych Patients Only)  Admission Status Involuntary  Psychosocial Assessment  Patient Complaints Worrying  Eye Contact Fair  Facial Expression Flat;Worried  Affect Appropriate to circumstance  Speech Logical/coherent  Interaction Cautious  Motor Activity Slow  Appearance/Hygiene Unremarkable  Behavior Characteristics Cooperative  Mood Pleasant  Aggressive Behavior  Effect No apparent injury  Thought Process  Coherency WDL  Content WDL  Delusions None reported or observed  Perception WDL  Hallucination None reported or observed  Judgment WDL  Confusion None  Danger to Self  Current suicidal ideation? Denies  Danger to Others  Danger to Others None reported or observed  Danger to Others Abnormal  Harmful Behavior to others No threats or harm toward other people  Destructive Behavior No threats or harm toward property

## 2021-11-08 NOTE — Group Note (Unsigned)
Occupational Therapy Group Note  Group Topic:Coping Skills  Group Date: 11/08/2021 Start Time: 1400 End Time: 1445 Facilitators: Brantley Stage, OT   Group Description: Group encouraged increased engagement and participation through discussion and activity focused on "Coping Ahead." Patients were split up into teams and selected a card from a stack of positive coping strategies. Patients were instructed to act out/charade the coping skill for other peers to guess and receive points for their team. Discussion followed with a focus on identifying additional positive coping strategies and patients shared how they were going to cope ahead over the weekend while continuing hospitalization stay.  Therapeutic Goal(s): Identify positive vs negative coping strategies. Identify coping skills to be used during hospitalization vs coping skills outside of hospital/at home Increase participation in therapeutic group environment and promote engagement in treatment   Participation Level: {OT [facility] Participation KKXFG:18299}   Participation Quality: {OT [place] Participation Quality:26268}   Behavior: {BHH OT Group Behavior:26269}   Speech/Thought Process: {BHH OT Speech/Thought Process:26270}   Affect/Mood: {OT [facility] Affect/Mood:26271}   Insight: {OT [facility] Insight:26272}   Judgement: {OT [facility] Judgement:26272}   Individualization: *** was *** in their participation of group discussion/activity. *** identified  Modes of Intervention: {BHH MODES OF INTERVENTION:26273}  Patient Response to Interventions:  {BHH OT Patient Response to Interventions:26274}   Plan: Continue to engage patient in OT groups 2 - 3x/week.  11/08/2021  Brantley Stage, OT

## 2021-11-08 NOTE — Progress Notes (Signed)
Recreation Therapy Notes  INPATIENT RECREATION TR PLAN  Patient Details Name: [name] [name] MRN: [id number] DOB: Jun 17, 1980 Today's Date: 11/08/2021  Rec Therapy Plan Is patient appropriate for Therapeutic Recreation?: Yes Treatment times per week: about 3 days Estimated Length of Stay: 5-7 days TR Treatment/Interventions: Group participation (Comment)  Discharge Criteria Pt will be discharged from therapy if:: Discharged Treatment plan/goals/alternatives discussed and agreed upon by:: Patient/family  Discharge Summary Short term goals set: See patient care plan Short term goals met: Complete Progress toward goals comments: Groups attended Which groups?: Self-esteem, Coping skills, Other (Comment) (Team Building; Drumming) Reason goals not met: None Therapeutic equipment acquired: N/A Reason patient discharged from therapy: Discharge from hospital Date patient discharged from therapy: 11/08/21   Aws Shere-McCall, LRT,CTRS Xan Sparkman A Aashika Carta-McCall 11/08/2021, 1:01 PM

## 2021-11-08 NOTE — [facility] Suicide Risk Assessment (Signed)
[facility] Discharge Suicide Risk Assessment   Principal Problem: Schizoaffective disorder, bipolar type [facility]) Discharge Diagnoses: Principal Problem:   Schizoaffective disorder, bipolar type ([place])   Total Time spent with patient: 20 minutes  [name] [name] is a 41 y.o. female with past psychiatric history significant for schizoaffective disorder bipolar type and with 12 ED visits over the past 6 months for manic behavior who presents to the [facility] requesting medication refills in the setting of reported medication noncompliance who was subsequently transferred to [facility] under IVC for acute manic symptoms with     HOSPITAL COURSE:   During the patient's hospitalization, patient had extensive initial psychiatric evaluation, and follow-up psychiatric evaluations every day.   Psychiatric diagnoses provided upon initial assessment:  -Schizoaffective disorder, bipolar type    Patient's psychiatric medications were adjusted on admission:   -- Start quetiapine IR 100 mg TID for mood stabilization and treatment of psychosis  -- Start quetiapine XR 400 mg QHS for sleep, mood stabilization, and treatment of psychosis.  -- Start temazepam 15 mg QHS for sleep, agitation, plus 15 mg qhs PRN   -- Continue divalproex 500 mg BID for mood stabilization    During the hospitalization, other adjustments were made to the patient's psychiatric medication regimen:  -- Started ativan '1mg'$  TID - this was tapered to dc priot to discharge  -- Start paliperidone '6mg'$  QHS for treatment of residual positive symptoms of schizophrenia, switched to nighttime dosing because of excessive sedation during the day. Invega was titrated to 9 mg once daily, then dc once pt received the LAI.  -- Depakote was changed during hospitlization. Her dose was ultimately adjusted to ER  1000 QHS for mood stabilization, switched to nighttime dosing because of excessive sedation during the day. VA level on 11-06-21 was  86.  -- Quetiapine doses were adjusted during admission. Ultimately the daytime doses were dc due to daytime sedation. Her final seroquel dose is XR 400 mg qhs.  -- Discontinued temazepam 15 mg QHS for sleep  -- Taper lorazepam off  --Pt received paliperidone 234 mg IM for 10/28, followed by 156 mg on 11/07/2021, then followed by 234 mg on 12-05-2021, then every 4 weeks after      Patient's care was discussed during the interdisciplinary team meeting every day during the hospitalization.   The patient denied having side effects to prescribed psychiatric medication. Aims score zero on my exam. No eps on my exam.   On admission, the pt was floridly manic. Her main responded to mood stabilizing treatments. Psychosis was treated to baseline.  Gradually, patient started adjusting to milieu. The patient was evaluated each day by a clinical provider to ascertain response to treatment. Improvement was noted by the patient's report of decreasing symptoms, improved sleep and appetite, affect, medication tolerance, behavior, and participation in unit programming.  Patient was asked each day to complete a self inventory noting mood, mental status, pain, new symptoms, anxiety and concerns.     Symptoms were reported as significantly decreased or resolved completely by discharge.    On day of discharge, the patient reports that their mood is stable. The patient denied having suicidal thoughts for more than 48 hours prior to discharge.  Patient denies having homicidal thoughts.  Patient denies having auditory hallucinations.  Patient denies any visual hallucinations or other symptoms of psychosis. The patient was motivated to continue taking medication with a goal of continued improvement in mental health.    The patient reports their target psychiatric  symptoms of mania and psychosis, all responded well to the psychiatric medications, and the patient reports overall benefit other psychiatric hospitalization.  Supportive psychotherapy was provided to the patient. The patient also participated in regular group therapy while hospitalized. Coping skills, problem solving as well as relaxation therapies were also part of the unit programming.   Labs were reviewed with the patient, and abnormal results were discussed with the patient.   The patient is able to verbalize their individual safety plan to this provider.   # It is recommended to the patient to continue psychiatric medications as prescribed, after discharge from the hospital.     # It is recommended to the patient to follow up with your outpatient psychiatric provider and PCP.   # It was discussed with the patient, the impact of alcohol, drugs, tobacco have been there overall psychiatric and medical wellbeing, and total abstinence from substance use was recommended the patient.ed.   # Prescriptions provided or sent directly to preferred pharmacy at discharge. Patient agreeable to plan. Given opportunity to ask questions. Appears to feel comfortable with discharge.    # In the event of worsening symptoms, the patient is instructed to call the crisis hotline, 911 and or go to the nearest ED for appropriate evaluation and treatment of symptoms. To follow-up with primary care provider for other medical issues, concerns and or health care needs   # Patient was discharged [facility] with a plan to follow up as noted below.   Psychiatric Specialty Exam  Presentation  General Appearance:  Appropriate for Environment; Casual  Eye Contact: Good  Speech: Clear and Coherent; Normal Rate  Speech Volume: Normal  Handedness: Right   Mood and Affect  Mood: Euthymic  Duration of Depression Symptoms: Greater than two weeks  Affect: Congruent   Thought Process  Thought Processes: Linear  Descriptions of Associations:Intact  Orientation:Full (Time, Place and Person)  Thought Content:Logical  History of Schizophrenia/Schizoaffective  disorder:Yes  Duration of Psychotic Symptoms:Greater than six months  Hallucinations:Hallucinations: None  Ideas of Reference:None  Suicidal Thoughts:Suicidal Thoughts: No  Homicidal Thoughts:Homicidal Thoughts: No   Sensorium  Memory: Immediate Good; Recent Good; Remote Good  Judgment: Fair  Insight: Fair   Community education officer  Concentration: Fair  Attention Span: Fair  Recall: [place] of Knowledge: Fair  Language: Good   Psychomotor Activity  Psychomotor Activity: Psychomotor Activity: Normal   Assets  Assets: Communication Skills; Desire for Improvement   Sleep  Sleep: Sleep: Fair   Physical Exam: Physical Exam See discharge summary  ROS See discharge summary  Blood pressure 104/68, pulse 100, temperature (!) 97.5 F (36.4 C), temperature source Oral, resp. rate 18, height '5\' 4"'$  (1.626 m), weight 89.3 kg, SpO2 99 %, unknown if currently breastfeeding. Body mass index is 33.78 kg/m.  Mental Status Per Nursing Assessment::   On Admission:  NA  Demographic Factors:  Unemployed  Loss Factors: Decrease in vocational status, Loss of significant relationship, Decline in physical health, and Financial problems/change in socioeconomic status  Historical Factors: Impulsivity  Risk Reduction Factors:   Religious beliefs about death, Positive social support, Positive therapeutic relationship, and Positive coping skills or problem solving skills  Continued Clinical Symptoms:  Schizoaffective d/o BP type - mood is stable. Delusions at baseline. Denying SI, HI.   Cognitive Features That Contribute To Risk:  None    Suicide Risk:  Mild:  There are no identifiable suicide plans, no associated intent, mild dysphoria and related symptoms, good self-control (both objective and  subjective assessment), few other risk factors, and identifiable protective factors, including available and accessible social support.    Follow-up [place] Follow up.   Why: You may call this provider regarding therapy and medication management services. Contact information: [address] [place], [place] [address] Phone: [phone]        [facility] Follow up.   Specialty: Behavioral Health Why: You may go to this provider for an assessment, to obtain therapy and medication management services on Monday or Wednesday, arrive by 7:30 am.  Services are provided on a first come, first served basis. Contact information: [place]. Call.   Why: Please reach out to this provider if you relocate to [place], [place] for ongoing mental health support, Med Management and therapy. Contact information: [address], [place] [address]. Main Phone Line [phone] Main Fax Line [phone]        [facility] Call.   Why: You have been referred for ACTT services.  Call to get connected and schedule an initial assessment while you are in [place]. Contact information: [address] [place] [address] [phone]         [place]. Schedule an appointment as soon as possible for a visit.   Why: Please call you provider to schedule an appointment for primary care services, as we were unable to contact prior to discharge. Contact information: [place] [address] [phone]                Plan Of Care/Follow-up recommendations:   Activity: as tolerated   Diet: heart healthy   Other: -Follow-up with your outpatient psychiatric provider -instructions on appointment date, time, and address (location) are provided to you in discharge paperwork.   -Take your psychiatric medications as prescribed at discharge - instructions are provided to you in the discharge  paperwork   -Follow-up with outpatient primary care doctor and other specialists -for management of preventative medicine and chronic medical disease - Type 2 DM, HTN   -Testing: Follow-up with outpatient provider for lab results:  11-06-21 VA 1   -Recommend abstinence from alcohol, tobacco, and other illicit drug use at discharge.    -If your psychiatric symptoms recur, worsen, or if you have side effects to your psychiatric medications, call your outpatient psychiatric provider, 911, [phone] or go to the nearest emergency department.   -If suicidal thoughts recur, call your outpatient psychiatric provider, 911, [phone] or go to the nearest emergency department.    Patient agrees with D/C instructions and plan.    Christoper Allegra, MD 11/08/2021, 9:38 AM

## 2021-11-08 NOTE — Progress Notes (Signed)
Important Message  Patient Details  Name: [name] [name] MRN: [id number] Date of Birth: 1980/05/19   Medicare Important Message Given:  (P) Yes   Patient informed of right to appeal discharge, provided phone number to [facility]. Patient expressed no interest in appealing discharge at this time. CSW will continue to monitor situation.   [place], LCSW 11/08/2021, 10:45 AM

## 2021-11-08 NOTE — Progress Notes (Signed)
Patient discharged. Reviewed discharge instructions with patient. Patient verbalized understanding. Patient received all personal belongings including sample medications and paper prescriptions. Patient left unit at 1207.

## 2021-11-08 NOTE — Plan of Care (Signed)
Patient was able to focus and cooperate with redirection from staff within 5 recreation therapy group sessions.   Lucianne Smestad-McCall, LRT,CTRS

## 2021-11-08 NOTE — Progress Notes (Signed)
   11/08/21 0515  Sleep  Number of Hours 8

## 2021-11-08 NOTE — Progress Notes (Signed)
  [facility] Adult Case Management Discharge Plan :  Will you be returning to the same living situation after discharge:  No. Shelter At discharge, do you have transportation home?: Yes,  taxi from hospital Do you have the ability to pay for your medications: Yes,  insurance  Release of information consent forms completed and in the chart;  Patient's signature needed at discharge.  Patient to Follow up at:  Follow-up [place] Follow up.   Why: You may call this provider regarding therapy and medication management services. Contact information: [address] [place], [place] [address] Phone: [phone]        [facility] Follow up.   Specialty: Behavioral Health Why: You may go to this provider for an assessment, to obtain therapy and medication management services on Monday or Wednesday, arrive by 7:30 am.  Services are provided on a first come, first served basis. Contact information: [place]. Call.   Why: Please reach out to this provider if you relocate to [place], [place] for ongoing mental health support, Med Management and therapy. Contact information: [address], [place] [address]. Main Phone Line [phone] Main Fax Line [phone]        [facility] Call.   Why: You have been referred for ACTT services.  Call to get connected and schedule an initial assessment while you are in [place]. Contact information: [address] [place] [address] [phone]         [place]. Schedule an appointment as soon as possible for a visit.   Why: Please call you provider to schedule an appointment for primary care services, as we were unable to contact prior to discharge. Contact information: [place]  [place] [address] [phone]                Next level of care provider has access to [place] and Suicide Prevention discussed: Yes,  friend, [name].     Has patient been referred to the Quitline?: N/A patient is not a smoker  Patient has been referred for addiction treatment: [place], LCSW 11/08/2021, 10:49 AM

## 2021-11-21 ENCOUNTER — Ambulatory Visit (HOSPITAL_COMMUNITY)
Admission: EM | Admit: 2021-11-21 | Discharge: 2021-11-21 | Disposition: A | Discharge status: Other Acute Inpatient Hospital | Payer: Medicare Other | Attending: Behavioral Health | Admitting: Behavioral Health

## 2021-11-21 DIAGNOSIS — Z7984 Long term (current) use of oral hypoglycemic drugs: Secondary | ICD-10-CM | POA: Diagnosis not present

## 2021-11-21 DIAGNOSIS — Z1152 Encounter for screening for COVID-19: Secondary | ICD-10-CM | POA: Insufficient documentation

## 2021-11-21 DIAGNOSIS — Z91148 Patient's other noncompliance with medication regimen for other reason: Secondary | ICD-10-CM | POA: Insufficient documentation

## 2021-11-21 DIAGNOSIS — Z760 Encounter for issue of repeat prescription: Secondary | ICD-10-CM | POA: Diagnosis not present

## 2021-11-21 DIAGNOSIS — I10 Essential (primary) hypertension: Secondary | ICD-10-CM

## 2021-11-21 DIAGNOSIS — E119 Type 2 diabetes mellitus without complications: Secondary | ICD-10-CM | POA: Diagnosis not present

## 2021-11-21 DIAGNOSIS — I119 Hypertensive heart disease without heart failure: Secondary | ICD-10-CM | POA: Diagnosis not present

## 2021-11-21 DIAGNOSIS — F250 Schizoaffective disorder, bipolar type: Secondary | ICD-10-CM | POA: Diagnosis present

## 2021-11-21 LAB — CBC WITH DIFFERENTIAL/PLATELET
Abs Immature Granulocytes: 0.01 10*3/uL (ref 0.00–0.07)
Basophils Absolute: 0 10*3/uL (ref 0.0–0.1)
Basophils Relative: 1 %
Eosinophils Absolute: 0 10*3/uL (ref 0.0–0.5)
Eosinophils Relative: 0 %
HCT: 34.8 % — ABNORMAL LOW (ref 36.0–46.0)
Hemoglobin: 12.1 g/dL (ref 12.0–15.0)
Immature Granulocytes: 0 %
Lymphocytes Relative: 26 %
Lymphs Abs: 1.5 10*3/uL (ref 0.7–4.0)
MCH: 31.8 pg (ref 26.0–34.0)
MCHC: 34.8 g/dL (ref 30.0–36.0)
MCV: 91.3 fL (ref 80.0–100.0)
Monocytes Absolute: 0.6 10*3/uL (ref 0.1–1.0)
Monocytes Relative: 9 %
Neutro Abs: 3.7 10*3/uL (ref 1.7–7.7)
Neutrophils Relative %: 64 %
Platelets: 166 10*3/uL (ref 150–400)
RBC: 3.81 MIL/uL — ABNORMAL LOW (ref 3.87–5.11)
RDW: 13.4 % (ref 11.5–15.5)
WBC: 5.8 10*3/uL (ref 4.0–10.5)
nRBC: 0 % (ref 0.0–0.2)

## 2021-11-21 LAB — COMPREHENSIVE METABOLIC PANEL
ALT: 13 U/L (ref 0–44)
AST: 17 U/L (ref 15–41)
Albumin: 4.1 g/dL (ref 3.5–5.0)
Alkaline Phosphatase: 60 U/L (ref 38–126)
Anion gap: 13 (ref 5–15)
BUN: 12 mg/dL (ref 6–20)
CO2: 25 mmol/L (ref 22–32)
Calcium: 9.3 mg/dL (ref 8.9–10.3)
Chloride: 99 mmol/L (ref 98–111)
Creatinine, Ser: 0.77 mg/dL (ref 0.44–1.00)
GFR, Estimated: >60 mL/min (ref >60)
Glucose, Bld: 162 mg/dL — ABNORMAL HIGH (ref 70–99)
Potassium: 3.7 mmol/L (ref 3.5–5.1)
Sodium: 137 mmol/L (ref 135–145)
Total Bilirubin: 0.2 mg/dL — ABNORMAL LOW (ref 0.3–1.2)
Total Protein: 7.9 g/dL (ref 6.5–8.1)

## 2021-11-21 LAB — HEMOGLOBIN A1C
Hgb A1c MFr Bld: 6 % — ABNORMAL HIGH (ref 4.8–5.6)
Mean Plasma Glucose: 125.5 mg/dL

## 2021-11-21 LAB — RESP PANEL BY RT-PCR (FLU A&B, COVID) ARPGX2
Influenza A by PCR: NEGATIVE
Influenza B by PCR: NEGATIVE
SARS Coronavirus 2 by RT PCR: NEGATIVE

## 2021-11-21 LAB — POC SARS CORONAVIRUS 2 AG: SARSCOV2ONAVIRUS 2 AG: NEGATIVE

## 2021-11-21 LAB — LIPID PANEL
Cholesterol: 186 mg/dL (ref 0–200)
HDL: 69 mg/dL (ref >40)
LDL Cholesterol: 113 mg/dL — ABNORMAL HIGH (ref 0–99)
Total CHOL/HDL Ratio: 2.7 RATIO
Triglycerides: 18 mg/dL (ref <150)
VLDL: 4 mg/dL (ref 0–40)

## 2021-11-21 LAB — ETHANOL: Alcohol, Ethyl (B): <10 mg/dL (ref <10)

## 2021-11-21 LAB — VALPROIC ACID LEVEL: Valproic Acid Lvl: <10 ug/mL — ABNORMAL LOW (ref 50.0–100.0)

## 2021-11-21 LAB — GLUCOSE, CAPILLARY: Glucose-Capillary: 177 mg/dL — ABNORMAL HIGH (ref 70–99)

## 2021-11-21 LAB — TSH: TSH: 2.418 u[IU]/mL (ref 0.350–4.500)

## 2021-11-21 MED ORDER — METOPROLOL SUCCINATE ER 25 MG PO TB24: 25.0000 mg | ORAL_TABLET | Freq: Every day | ORAL | Status: DC

## 2021-11-21 MED ORDER — MAGNESIUM HYDROXIDE 400 MG/5ML PO SUSP: 30.0000 mL | Freq: Every day | ORAL | Status: DC | PRN

## 2021-11-21 MED ORDER — BENZTROPINE MESYLATE 1 MG PO TABS
1.0000 mg | ORAL_TABLET | Freq: Two times a day (BID) | ORAL | Status: DC
  Filled 2021-11-21: qty 1

## 2021-11-21 MED ORDER — ACETAMINOPHEN 325 MG PO TABS: 650.0000 mg | ORAL_TABLET | Freq: Four times a day (QID) | ORAL | Status: DC | PRN

## 2021-11-21 MED ORDER — METFORMIN HCL ER 500 MG PO TB24
500.0000 mg | ORAL_TABLET | Freq: Two times a day (BID) | ORAL | Status: DC
  Filled 2021-11-21: qty 1

## 2021-11-21 MED ORDER — HYDROXYZINE HCL 25 MG PO TABS: 25.0000 mg | ORAL_TABLET | Freq: Three times a day (TID) | ORAL | Status: DC | PRN

## 2021-11-21 MED ORDER — QUETIAPINE FUMARATE ER 200 MG PO TB24: 400.0000 mg | ORAL_TABLET | Freq: Every day | ORAL | Status: DC

## 2021-11-21 MED ORDER — HYDROXYZINE HCL 25 MG PO TABS: 25.0000 mg | ORAL_TABLET | Freq: Three times a day (TID) | ORAL | 0 refills | Status: DC | PRN

## 2021-11-21 MED ORDER — ZIPRASIDONE MESYLATE 20 MG IM SOLR
20.0000 mg | INTRAMUSCULAR | Status: AC | PRN
  Administered 2021-11-21: 20 mg via INTRAMUSCULAR
  Filled 2021-11-21: qty 20

## 2021-11-21 MED ORDER — LORAZEPAM 1 MG PO TABS: 1.0000 mg | ORAL_TABLET | ORAL | 0 refills | Status: DC | PRN

## 2021-11-21 MED ORDER — DIVALPROEX SODIUM ER 500 MG PO TB24: 1000.0000 mg | ORAL_TABLET | Freq: Every day | ORAL | Status: DC

## 2021-11-21 MED ORDER — ALUM & MAG HYDROXIDE-SIMETH 200-200-20 MG/5ML PO SUSP: 30.0000 mL | ORAL | Status: DC | PRN

## 2021-11-21 MED ORDER — OLANZAPINE 10 MG PO TBDP
10.0000 mg | ORAL_TABLET | Freq: Three times a day (TID) | ORAL | Status: DC | PRN
  Administered 2021-11-21: 10 mg via ORAL
  Filled 2021-11-21: qty 1

## 2021-11-21 MED ORDER — LOSARTAN POTASSIUM 50 MG PO TABS
50.0000 mg | ORAL_TABLET | Freq: Every day | ORAL | Status: DC
  Filled 2021-11-21: qty 1

## 2021-11-21 MED ORDER — LORAZEPAM 1 MG PO TABS
1.0000 mg | ORAL_TABLET | ORAL | Status: DC | PRN
  Filled 2021-11-21: qty 1

## 2021-11-21 NOTE — ED Notes (Signed)
Pt is yelling and speaking in third person. Will continue to redirect.

## 2021-11-21 NOTE — ED Notes (Signed)
[facility] called to collect STAT specimens and to deliver to [facility]. 

## 2021-11-21 NOTE — ED Provider Notes (Signed)
FBC/OBS ASAP Discharge Summary  Date and Time: 11/21/2021 1:43 PM  Name: Buelah Rennie  MRN:  [id number]   Discharge Diagnoses:  Final diagnoses:  Non compliance w medication regimen  Schizoaffective disorder, bipolar type ([place])  Type 2 diabetes mellitus without complication, without long-term current use of insulin ([place])  Hypertension, unspecified type    Subjective and Stay Summary per NP, Darrol Angel on 11/21/21 AM: Delainy Mcelhiney is a 41 year old female patient with a past psychiatric history significant for schizoaffective disorder, bipolar type, medication noncompliance and delusional disorder who presented to the [facility] voluntary with a chief complaint of requesting a medication refill.     Patient seen and evaluated face-to-face by this provider, chart reviewed and case discussed with Dr. Dwyane Dee. Per chart review, patient was hospitalized at [facility] from 10/22/21 - 11/08/21 and was prescribed benztropine 1 mg p.o. twice daily, Depakote 1000 mg nightly, Cozaar 50 mg p.o. daily, metformin 500 mg p.o. twice daily, metoprolol 25 mg nightly, Seroquel 400 mg p.o. nightly and next Invega injection is due on 12/05/2021.   On evaluation, the patient presents with pressured speech, and flight of ideas. Her thought process is disorganized with delusional, grandiose and hyper religious thought content. Her speech is tangential and rapid. Her mood is labile and affect is congruent. She is noted to be easily agitated and trying to elope.   Patient is difficult to assess as she is unable to answer questions appropriately and provides irrelevant information. For example, the patient would refer to herself as a doctor. She also states that she is a Engineer, agricultural and that she is currently working with an attorney to make an arrest.    She denies suicidal ideations. When asked if she is homicidal towards anyone, she states, "I don't want to answer. I believe that what you  do on to others will come back to you." It is unclear if she is experiencing AVH. She states, "I live with someone else and I baptized christians. I can see the dead walking."    She states that she has not been sleeping because she saw her decreased husband in her sleep. She states that she is not concerned with sleep because the bible says to be awake.    When asked if she uses illicit drugs or alcohol. She states, "I rather not get into that. I don't think that is wise. I have a glass of wine for communion when I am sick to purge myself.    She states that she ran out of her mediations three days ago and has not taken her medications. She states that she was given an [place] injection but does not know where to go to get it.   Total Time spent with patient: 45 minutes  Past Psychiatric History: History of schizoaffective disorder, bipolar type, medication non compliance, delusional disorder and multiple inpatient psychiatric hospitalizations. Last hospitalization was at [facility] from 10/22/21-11/08/21.   Past Medical History:  Past Medical History:  Diagnosis Date   Bipolar affective disorder, currently manic, mild ([place])    Diabetes mellitus without complication ([place])    Schizophrenia ([place])     Past Surgical History:  Procedure Laterality Date   CESAREAN SECTION  04/2018   WISDOM TOOTH EXTRACTION     Family History:  Family History  Problem Relation Age of Onset   Drug abuse Maternal Uncle    Family Psychiatric History: No further reported hx. Social History:  Social History   Substance  and Sexual Activity  Alcohol Use Not Currently     Social History   Substance and Sexual Activity  Drug Use Not Currently    Social History   Socioeconomic History   Marital status: Legally Separated    Spouse name: Not on file   Number of children: Not on file   Years of education: Not on file   Highest education level: Not on file  Occupational History   Not  on file  Tobacco Use   Smoking status: Never   Smokeless tobacco: Never  Vaping Use   Vaping Use: Never used  Substance and Sexual Activity   Alcohol use: Not Currently   Drug use: Not Currently   Sexual activity: Not Currently  Other Topics Concern   Not on file  Social History Narrative   ** Merged History Encounter **       ** Merged History Encounter **       Social Determinants of Health   Financial Resource Strain: Not on file  Food Insecurity: Not on file  Transportation Needs: Not on file  Physical Activity: Not on file  Stress: Not on file  Social Connections: Not on file   SDOH:  SDOH Screenings   Alcohol Screen: Low Risk  (07/05/2019)  Tobacco Use: Low Risk  (12/03/2020)    Tobacco Cessation:  N/A, patient does not currently use tobacco products  Current Medications:  Current Facility-Administered Medications  Medication Dose Route Frequency Provider Last Rate Last Admin   acetaminophen (TYLENOL) tablet 650 mg  650 mg Oral Q6H PRN White, Patrice L, NP       alum & mag hydroxide-simeth (MAALOX/MYLANTA) 200-200-20 MG/5ML suspension 30 mL  30 mL Oral Q4H PRN White, Patrice L, NP       benztropine (COGENTIN) tablet 1 mg  1 mg Oral BID White, Patrice L, NP       divalproex (DEPAKOTE ER) 24 hr tablet 1,000 mg  1,000 mg Oral QHS White, Patrice L, NP       hydrOXYzine (ATARAX) tablet 25 mg  25 mg Oral TID PRN White, Patrice L, NP       LORazepam (ATIVAN) tablet 1 mg  1 mg Oral PRN White, Patrice L, NP       losartan (COZAAR) tablet 50 mg  50 mg Oral Daily White, Patrice L, NP       magnesium hydroxide (MILK OF MAGNESIA) suspension 30 mL  30 mL Oral Daily PRN White, Patrice L, NP       metFORMIN (GLUCOPHAGE-XR) 24 hr tablet 500 mg  500 mg Oral BID WC White, Patrice L, NP       metoprolol succinate (TOPROL-XL) 24 hr tablet 25 mg  25 mg Oral QHS White, Patrice L, NP       QUEtiapine (SEROQUEL XR) 24 hr tablet 400 mg  400 mg Oral QHS White, Patrice L, NP       Current  Outpatient Medications  Medication Sig Dispense Refill   benztropine (COGENTIN) 1 MG tablet Take 1 tablet (1 mg total) by mouth 2 (two) times daily. 60 tablet 0   divalproex (DEPAKOTE ER) 500 MG 24 hr tablet Take 2 tablets (1,000 mg total) by mouth at bedtime. 60 tablet 0   losartan (COZAAR) 50 MG tablet Take 1 tablet (50 mg total) by mouth daily. 30 tablet 0   metFORMIN (GLUCOPHAGE-XR) 500 MG 24 hr tablet Take 1 tablet (500 mg total) by mouth 2 (two) times daily with a meal. 60  tablet 0   metoprolol succinate (TOPROL-XL) 25 MG 24 hr tablet Take 1 tablet (25 mg total) by mouth at bedtime. 30 tablet 0   [START ON 12/05/2021] paliperidone (INVEGA SUSTENNA) 234 MG/1.5ML injection Inject 234 mg into the muscle once for 1 dose. Administer on 12-05-2021 1.5 mL 0   QUEtiapine (SEROQUEL XR) 400 MG 24 hr tablet Take 1 tablet (400 mg total) by mouth at bedtime. 30 tablet 0    PTA Medications: (Not in a hospital admission)       No data to display          [place] ED from 11/21/2021 in [facility] Admission (Discharged) from 10/22/2021 in [place] [facility] ED from 10/20/2021 in [place] DEPT  C-SSRS RISK CATEGORY No Risk No Risk No Risk       Musculoskeletal  Strength & Muscle Tone: within normal limits Gait & Station: normal Patient leans: N/A  Psychiatric Specialty Exam  Presentation  General Appearance:  Appropriate for Environment  Eye Contact: Fair  Speech: Pressured; Clear and Coherent  Speech Volume: Increased  Handedness: Right   Mood and Affect  Mood: Labile  Affect: Labile   Thought Process  Thought Processes: Irrevelant; Disorganized  Descriptions of Associations:Tangential  Orientation:Partial  Thought Content:Illogical; Scattered; Tangential  Diagnosis of Schizophrenia or Schizoaffective disorder in past: Yes (schizoaffective per chart)  Duration of  Psychotic Symptoms: -- Pincus Badder)   Hallucinations:Hallucinations: Other (comment) (UTA)  Ideas of Reference:Delusions  Suicidal Thoughts:Suicidal Thoughts: No  Homicidal Thoughts:Homicidal Thoughts: No   Sensorium  Memory: Immediate Fair  Judgment: Poor  Insight: Lacking   Executive Functions  Concentration: Poor  Attention Span: Poor  Recall: Poor  Fund of Knowledge: Fair  Language: Fair   Psychomotor Activity  Psychomotor Activity: Psychomotor Activity: Increased; Restlessness   Assets  Assets: Armed forces logistics/support/administrative officer; Desire for Improvement; Social Support   Sleep  Sleep: Sleep: Poor   Nutritional Assessment (For OBS and FBC admissions only) Has the patient had a weight loss or gain of 10 pounds or more in the last 3 months?: No Has the patient had a decrease in food intake/or appetite?: No Does the patient have dental problems?: No Does the patient have eating habits or behaviors that may be indicators of an eating disorder including binging or inducing vomiting?: No Has the patient recently lost weight without trying?: 0 Has the patient been eating poorly because of a decreased appetite?: 0 Malnutrition Screening Tool Score: 0    Physical Exam  HENT:     Head: Normocephalic.     Nose: Nose normal.  Eyes:     Conjunctiva/sclera: Conjunctivae normal.  Cardiovascular:     Rate and Rhythm: Tachycardia present.  Pulmonary:     Effort: Pulmonary effort is normal.  Musculoskeletal:        General: Normal range of motion.  Neurological:     Mental Status: She is alert.      Review of Systems  Constitutional: Negative.   HENT: Negative.    Eyes: Negative.   Respiratory: Negative.    Cardiovascular: Negative.   Gastrointestinal: Negative.   Genitourinary: Negative.   Musculoskeletal: Negative.   Skin: Negative.   Neurological: Negative.   Endo/Heme/Allergies: Negative.     Blood pressure 130/87, pulse (!) 112, temperature 98.4 F (36.9  C), temperature source Oral, resp. rate 16, SpO2 99 %, unknown if currently breastfeeding. There is no height or weight on file to calculate BMI.  Demographic Factors:  Low socioeconomic status and Unemployed  Loss Factors: Decrease in vocational status, Loss of significant relationship, Decline in physical health, and Financial problems/change in socioeconomic status  Historical Factors: Impulsivity  Risk Reduction Factors:   Religious beliefs about death, Positive social support, Positive therapeutic relationship, and Positive coping skills or problem solving skills  Continued Clinical Symptoms:  Currently Psychotic Previous Psychiatric Diagnoses and Treatments Schizoaffective d/o BP type   Cognitive Features That Contribute To Risk:  None    Suicide Risk:  Minimal: No identifiable suicidal ideation.  Patients presenting with no risk factors but with morbid ruminations; may be classified as minimal risk based on the severity of the depressive symptoms  Plan Of Care/Follow-up recommendations:  Follow-up recommendations:  Activity:  Normal, as tolerated Diet:  Per PCP recommendation  Patient is instructed prior to discharge to: Take all medications as prescribed by her mental healthcare provider. Report any adverse effects and/or reactions from the medicines to her outpatient provider promptly. Patient has been instructed & cautioned: To not engage in alcohol and or illegal drug use while on prescription medicines.  In the event of worsening symptoms, patient is instructed to call the crisis hotline at 988, 911 and or go to the nearest ED for appropriate evaluation and treatment of symptoms. To follow-up with her primary care provider for your other medical issues, concerns and or health care needs.   Disposition: [facility]; attending Jonelle Sports, MD  Rosezetta Schlatter, MD 11/21/2021, 1:43 PM

## 2021-11-21 NOTE — ED Notes (Signed)
Report provided to [name] [name] at [facility]. Transportation services called to [facility] Dept. EMTALA, eMAR and H&P printed. All placed in envelope w/IVC paperwork for transfer.

## 2021-11-21 NOTE — Progress Notes (Addendum)
Pt is under review at [place]. CSW will assist and follow.   Benjaman Kindler, MSW, LCSWA 11/21/2021 11:30 AM

## 2021-11-21 NOTE — ED Provider Notes (Signed)
[facility] Continuous Assessment Admission H&P  Date: 11/21/21 Patient Name: [name] [name] MRN: [id number]     Diagnoses:  Final diagnoses:  Non compliance w medication regimen  Schizoaffective disorder, bipolar type ([place])  Type 2 diabetes mellitus without complication, without long-term current use of insulin ([place])  Hypertension, unspecified type    HPI: [name] [name] is a 41 year old female patient with a past psychiatric history significant for schizoaffective disorder, bipolar type, medication noncompliance and delusional disorder who presented to the [facility] voluntary with a chief complaint of requesting a medication refill.    Patient seen and evaluated face-to-face by this provider, chart reviewed and case discussed with Dr. Dwyane Dee. Per chart review, patient was hospitalized at [facility] from 10/22/21 - 11/08/21 and was prescribed benztropine 1 mg p.o. twice daily, Depakote 1000 mg nightly, Cozaar 50 mg p.o. daily, metformin 500 mg p.o. twice daily, metoprolol 25 mg nightly, Seroquel 400 mg p.o. nightly and next Invega injection is due on 12/05/2021.  On evaluation, the patient presents with pressured speech, and flight of ideas. Her thought process is disorganized with delusional, grandiose and hyper religious thought content. Her speech is tangential and rapid. Her mood is labile and affect is congruent. She is noted to be easily agitated and trying to elope.  Patient is difficult to assess as she is unable to answer questions appropriately and provides irrelevant information. For example, the patient would refer to herself as a doctor. She also states that she is a Engineer, agricultural and that she is currently working with an attorney to make an arrest.   She denies suicidal ideations. When asked if she is homicidal towards anyone, she states, "I don't want to answer. I believe that what you do on to others will come back to you." It is unclear if she is  experiencing AVH. She states, "I live with someone else and I baptized christians. I can see the dead walking."   She states that she has not been sleeping because she saw her decreased husband in her sleep. She states that she is not concerned with sleep because the bible says to be awake.   When asked if she uses illicit drugs or alcohol. She states, "I rather not get into that. I don't think that is wise. I have a glass of wine for communion when I am sick to purge myself.   She states that she ran out of her mediations three days ago and has not taken her medications. She states that she was given an [place] injection but does not know where to go to get it.    PHQ 2-9:   [place] [facility] from 11/21/2021 in [facility] Admission (Discharged) from 10/22/2021 in [facility] ED from 10/20/2021 in [place] DEPT  C-SSRS RISK CATEGORY No Risk No Risk No Risk        Total Time spent with patient: 45 minutes  Musculoskeletal  Strength & Muscle Tone: within normal limits Gait & Station: normal Patient leans: N/A  Psychiatric Specialty Exam  Presentation General Appearance:  Appropriate for Environment  Eye Contact: Fair  Speech: Pressured; Clear and Coherent  Speech Volume: Increased  Handedness: Right   Mood and Affect  Mood: Labile  Affect: Labile   Thought Process  Thought Processes: Irrevelant; Disorganized  Descriptions of Associations:Tangential  Orientation:Partial  Thought Content:Illogical; Scattered; Tangential  Diagnosis of Schizophrenia or Schizoaffective disorder in past: Yes (schizoaffective per chart)  Duration of Psychotic Symptoms: -- Pincus Badder)  Hallucinations:Hallucinations: Other (comment) (UTA)  Ideas of Reference:Delusions  Suicidal Thoughts:Suicidal Thoughts: No  Homicidal Thoughts:Homicidal Thoughts: No   Sensorium  Memory: Immediate  Fair  Judgment: Poor  Insight: Lacking   Executive Functions  Concentration: Poor  Attention Span: Poor  Recall: Poor  Fund of Knowledge: Fair  Language: Fair   Psychomotor Activity  Psychomotor Activity: Psychomotor Activity: Increased; Restlessness   Assets  Assets: Armed forces logistics/support/administrative officer; Desire for Improvement; Social Support   Sleep  Sleep: Sleep: Poor   Nutritional Assessment (For OBS and FBC admissions only) Has the patient had a weight loss or gain of 10 pounds or more in the last 3 months?: No Has the patient had a decrease in food intake/or appetite?: No Does the patient have dental problems?: No Does the patient have eating habits or behaviors that may be indicators of an eating disorder including binging or inducing vomiting?: No Has the patient recently lost weight without trying?: 0 Has the patient been eating poorly because of a decreased appetite?: 0 Malnutrition Screening Tool Score: 0    Physical Exam HENT:     Head: Normocephalic.     Nose: Nose normal.  Eyes:     Conjunctiva/sclera: Conjunctivae normal.  Cardiovascular:     Rate and Rhythm: Tachycardia present.  Pulmonary:     Effort: Pulmonary effort is normal.  Musculoskeletal:        General: Normal range of motion.  Neurological:     Mental Status: She is alert.    Review of Systems  Constitutional: Negative.   HENT: Negative.    Eyes: Negative.   Respiratory: Negative.    Cardiovascular: Negative.   Gastrointestinal: Negative.   Genitourinary: Negative.   Musculoskeletal: Negative.   Skin: Negative.   Neurological: Negative.   Endo/Heme/Allergies: Negative.     Blood pressure 130/87, pulse (!) 112, temperature 98.4 F (36.9 C), temperature source Oral, resp. rate 16, SpO2 99 %, unknown if currently breastfeeding. There is no height or weight on file to calculate BMI.  Past Psychiatric History: History of schizoaffective disorder, bipolar type, medication non  compliance, delusional disorder and multiple inpatient psychiatric hospitalizations. Last hospitalization was at [facility] from 10/22/21-11/08/21.   Is the patient at risk to self? No  Has the patient been a risk to self in the past 6 months? No .    Has the patient been a risk to self within the distant past? No   Is the patient a risk to others? No   Has the patient been a risk to others in the past 6 months? No   Has the patient been a risk to others within the distant past? No   Past Medical History:  Past Medical History:  Diagnosis Date   Bipolar affective disorder, currently manic, mild ([place])    Diabetes mellitus without complication ([place])    Schizophrenia ([place])     Past Surgical History:  Procedure Laterality Date   CESAREAN SECTION  04/2018   WISDOM TOOTH EXTRACTION      Family History:  Family History  Problem Relation Age of Onset   Drug abuse Maternal Uncle     Social History:  Social History   Socioeconomic History   Marital status: Legally Separated    Spouse name: Not on file   Number of children: Not on file   Years of education: Not on file   Highest education level: Not on file  Occupational History  Not on file  Tobacco Use   Smoking status: Never   Smokeless tobacco: Never  Vaping Use   Vaping Use: Never used  Substance and Sexual Activity   Alcohol use: Not Currently   Drug use: Not Currently   Sexual activity: Not Currently  Other Topics Concern   Not on file  Social History Narrative   ** Merged History Encounter **       ** Merged History Encounter **       Social Determinants of Health   Financial Resource Strain: Not on file  Food Insecurity: Not on file  Transportation Needs: Not on file  Physical Activity: Not on file  Stress: Not on file  Social Connections: Not on file  Intimate Partner Violence: Not on file    SDOH:  SDOH Screenings   Alcohol Screen: Low Risk  (07/05/2019)  Tobacco Use: Low Risk   (12/03/2020)    Last Labs:  Admission on 11/21/2021  Component Date Value Ref Range Status   SARS Coronavirus 2 by RT PCR 11/21/2021 NEGATIVE  NEGATIVE Final   Comment: (NOTE) SARS-CoV-2 target nucleic acids are NOT DETECTED.  The SARS-CoV-2 RNA is generally detectable in upper respiratory specimens during the acute phase of infection. The lowest concentration of SARS-CoV-2 viral copies this assay can detect is 138 copies/mL. A negative result does not preclude SARS-Cov-2 infection and should not be used as the sole basis for treatment or other patient management decisions. A negative result may occur with  improper specimen collection/handling, submission of specimen other than nasopharyngeal swab, presence of viral mutation(s) within the areas targeted by this assay, and inadequate number of viral copies(<138 copies/mL). A negative result must be combined with clinical observations, patient history, and epidemiological information. The expected result is Negative.  Fact Sheet for Patients:  EntrepreneurPulse.com.au  Fact Sheet for Healthcare Providers:  IncredibleEmployment.be  This test is no                          t yet approved or cleared by the [place] FDA and  has been authorized for detection and/or diagnosis of SARS-CoV-2 by FDA under an Emergency Use Authorization (EUA). This EUA will remain  in effect (meaning this test can be used) for the duration of the COVID-19 declaration under Section 564(b)(1) of the Act, 21 U.S.C.section 360bbb-3(b)(1), unless the authorization is terminated  or revoked sooner.       Influenza A by PCR 11/21/2021 NEGATIVE  NEGATIVE Final   Influenza B by PCR 11/21/2021 NEGATIVE  NEGATIVE Final   Comment: (NOTE) The Xpert Xpress SARS-CoV-2/FLU/RSV plus assay is intended as an aid in the diagnosis of influenza from Nasopharyngeal swab specimens and should not be used as a sole basis for  treatment. Nasal washings and aspirates are unacceptable for Xpert Xpress SARS-CoV-2/FLU/RSV testing.  Fact Sheet for Patients: EntrepreneurPulse.com.au  Fact Sheet for Healthcare Providers: IncredibleEmployment.be  This test is not yet approved or cleared by the [place] FDA and has been authorized for detection and/or diagnosis of SARS-CoV-2 by FDA under an Emergency Use Authorization (EUA). This EUA will remain in effect (meaning this test can be used) for the duration of the COVID-19 declaration under Section 564(b)(1) of the Act, 21 U.S.C. section 360bbb-3(b)(1), unless the authorization is terminated or revoked.  Performed at [facility], [place] [address], [place], [place] [address]    WBC 11/21/2021 5.8  4.0 - 10.5 K/uL Final   RBC  11/21/2021 3.81 (L)  3.87 - 5.11 MIL/uL Final   Hemoglobin 11/21/2021 12.1  12.0 - 15.0 g/dL Final   HCT 11/21/2021 34.8 (L)  36.0 - 46.0 % Final   MCV 11/21/2021 91.3  80.0 - 100.0 fL Final   MCH 11/21/2021 31.8  26.0 - 34.0 pg Final   MCHC 11/21/2021 34.8  30.0 - 36.0 g/dL Final   RDW 11/21/2021 13.4  11.5 - 15.5 % Final   Platelets 11/21/2021 166  150 - 400 K/uL Final   nRBC 11/21/2021 0.0  0.0 - 0.2 % Final   Neutrophils Relative % 11/21/2021 64  % Final   Neutro Abs 11/21/2021 3.7  1.7 - 7.7 K/uL Final   Lymphocytes Relative 11/21/2021 26  % Final   Lymphs Abs 11/21/2021 1.5  0.7 - 4.0 K/uL Final   Monocytes Relative 11/21/2021 9  % Final   Monocytes Absolute 11/21/2021 0.6  0.1 - 1.0 K/uL Final   Eosinophils Relative 11/21/2021 0  % Final   Eosinophils Absolute 11/21/2021 0.0  0.0 - 0.5 K/uL Final   Basophils Relative 11/21/2021 1  % Final   Basophils Absolute 11/21/2021 0.0  0.0 - 0.1 K/uL Final   Immature Granulocytes 11/21/2021 0  % Final   Abs Immature Granulocytes 11/21/2021 0.01  0.00 - 0.07 K/uL Final   Performed at [facility], [place] [address] [place], [place] [address]    Sodium 11/21/2021 137  135 - 145 mmol/L Final   Potassium 11/21/2021 3.7  3.5 - 5.1 mmol/L Final   Chloride 11/21/2021 99  98 - 111 mmol/L Final   CO2 11/21/2021 25  22 - 32 mmol/L Final   Glucose, Bld 11/21/2021 162 (H)  70 - 99 mg/dL Final   Glucose reference range applies only to samples taken after fasting for at least 8 hours.   BUN 11/21/2021 12  6 - 20 mg/dL Final   Creatinine, Ser 11/21/2021 0.77  0.44 - 1.00 mg/dL Final   Calcium 11/21/2021 9.3  8.9 - 10.3 mg/dL Final   Total Protein 11/21/2021 7.9  6.5 - 8.1 g/dL Final   Albumin 11/21/2021 4.1  3.5 - 5.0 g/dL Final   AST 11/21/2021 17  15 - 41 U/L Final   ALT 11/21/2021 13  0 - 44 U/L Final   Alkaline Phosphatase 11/21/2021 60  38 - 126 U/L Final   Total Bilirubin 11/21/2021 0.2 (L)  0.3 - 1.2 mg/dL Final   GFR, Estimated 11/21/2021 >60  >60 mL/min Final   Comment: (NOTE) Calculated using the CKD-EPI Creatinine Equation (2021)    Anion gap 11/21/2021 13  5 - 15 Final   Performed at [place] [address], [place], [place] [address]   Alcohol, Ethyl (B) 11/21/2021 <10  <10 mg/dL Final   Comment: (NOTE) Lowest detectable limit for serum alcohol is 10 mg/dL.  For medical purposes only. Performed at [facility], [place] [address], [place], [place] [address]    Cholesterol 11/21/2021 186  0 - 200 mg/dL Final   Triglycerides 11/21/2021 18  <150 mg/dL Final   HDL 11/21/2021 69  >40 mg/dL Final   Total CHOL/HDL Ratio 11/21/2021 2.7  RATIO Final   VLDL 11/21/2021 4  0 - 40 mg/dL Final   LDL Cholesterol 11/21/2021 113 (H)  0 - 99 mg/dL Final   Comment:        Total Cholesterol/HDL:CHD Risk Coronary Heart Disease Risk Table  Men   Women  1/2 Average Risk   3.4   3.3  Average Risk       5.0   4.4  2 X Average Risk   9.6   7.1  3 X Average Risk  23.4   11.0        Use the calculated Patient Ratio above and the CHD Risk Table to determine the patient's CHD Risk.        ATP III  CLASSIFICATION (LDL):  <100     mg/dL   Optimal  100-129  mg/dL   Near or Above                    Optimal  130-159  mg/dL   Borderline  160-189  mg/dL   High  >190     mg/dL   Very High Performed at [place] [address], [place], [place] [address]    TSH 11/21/2021 2.418  0.350 - 4.500 uIU/mL Final   Comment: Performed by a 3rd Generation assay with a functional sensitivity of <=0.01 uIU/mL. Performed at [facility], [place] [address] [place], [place] [address]    SARSCOV2ONAVIRUS 2 AG 11/21/2021 NEGATIVE  NEGATIVE Final   Comment: (NOTE) SARS-CoV-2 antigen NOT DETECTED.   Negative results are presumptive.  Negative results do not preclude SARS-CoV-2 infection and should not be used as the sole basis for treatment or other patient management decisions, including infection  control decisions, particularly in the presence of clinical signs and  symptoms consistent with COVID-19, or in those who have been in contact with the virus.  Negative results must be combined with clinical observations, patient history, and epidemiological information. The expected result is Negative.  Fact Sheet for Patients: HandmadeRecipes.com.cy  Fact Sheet for Healthcare Providers: FuneralLife.at  This test is not yet approved or cleared by the [place] FDA and  has been authorized for detection and/or diagnosis of SARS-CoV-2 by FDA under an Emergency Use Authorization (EUA).  This EUA will remain in effect (meaning this test can be used) for the duration of  the COV                          ID-19 declaration under Section 564(b)(1) of the Act, 21 U.S.C. section 360bbb-3(b)(1), unless the authorization is terminated or revoked sooner.    Admission on 10/22/2021, Discharged on 11/08/2021  Component Date Value Ref Range Status   Total Protein 10/31/2021 7.7  6.5 - 8.1 g/dL Final   Albumin 10/31/2021 3.6  3.5 - 5.0 g/dL Final    AST 10/31/2021 13 (L)  15 - 41 U/L Final   ALT 10/31/2021 11  0 - 44 U/L Final   Alkaline Phosphatase 10/31/2021 59  38 - 126 U/L Final   Total Bilirubin 10/31/2021 0.3  0.3 - 1.2 mg/dL Final   Bilirubin, Direct 10/31/2021 0.1  0.0 - 0.2 mg/dL Final   Indirect Bilirubin 10/31/2021 0.2 (L)  0.3 - 0.9 mg/dL Final   Performed at [facility], [place] [address], [place], [place] [address]   Valproic Acid Lvl 10/31/2021 104 (H)  50.0 - 100.0 ug/mL Final   Performed at [facility], [place] [address] [place], [place] [address]   Valproic Acid Lvl 11/06/2021 86  50.0 - 100.0 ug/mL Final   Performed at [facility], [place] [address] [place],  [address]  Admission on 10/20/2021, Discharged on  10/22/2021  Component Date Value Ref Range Status   Chlamydia 10/21/2021 Negative   Final   Neisseria Gonorrhea 10/21/2021 Negative   Final   Comment 10/21/2021 Normal Reference [place] Chlamydia - Negative   Final   Comment 10/21/2021 Normal Reference Range Neisseria Gonorrhea - Negative   Final   Color, Urine 10/21/2021 STRAW (A)  YELLOW Final   APPearance 10/21/2021 CLEAR  CLEAR Final   Specific Gravity, Urine 10/21/2021 1.004 (L)  1.005 - 1.030 Final   pH 10/21/2021 6.0  5.0 - 8.0 Final   Glucose, UA 10/21/2021 NEGATIVE  NEGATIVE mg/dL Final   Hgb urine dipstick 10/21/2021 NEGATIVE  NEGATIVE Final   Bilirubin Urine 10/21/2021 NEGATIVE  NEGATIVE Final   Ketones, ur 10/21/2021 NEGATIVE  NEGATIVE mg/dL Final   Protein, ur 10/21/2021 NEGATIVE  NEGATIVE mg/dL Final   Nitrite 10/21/2021 NEGATIVE  NEGATIVE Final   Leukocytes,Ua 10/21/2021 NEGATIVE  NEGATIVE Final   Performed at [facility], [place] [address] [place], [place] [address]   Yeast Wet Prep HPF POC 10/21/2021 NONE SEEN  NONE SEEN Final   Swab received with less than 0.5 mL of saline, saline added to specimen, interpret results with caution.   Trich, Wet Prep 10/21/2021 NONE  SEEN  NONE SEEN Final   Swab received with less than 0.5 mL of saline, saline added to specimen, interpret results with caution.   Clue Cells Wet Prep HPF POC 10/21/2021 NONE SEEN  NONE SEEN Final   Swab received with less than 0.5 mL of saline, saline added to specimen, interpret results with caution.   WBC, Wet Prep HPF POC 10/21/2021 <10  <10 Final   Swab received with less than 0.5 mL of saline, saline added to specimen, interpret results with caution.   Sperm 10/21/2021 NONE SEEN   Final   Comment: Swab received with less than 0.5 mL of saline, saline added to specimen, interpret results with caution. Performed at [facility], [place] [address], [place], [place] [address]    Preg Test, Ur 10/21/2021 NEGATIVE  NEGATIVE Final   Comment:        THE SENSITIVITY OF THIS METHODOLOGY IS >20 mIU/mL. Performed at [facility], [place] [address], [place], [place] [address]    RPR Ser Ql 10/21/2021 NON REACTIVE  NON REACTIVE Final   Performed at [facility], [place] [address], [place], [place] [address]   HIV Screen 4th Generation wRfx 10/21/2021 Non Reactive  Non Reactive Final   Performed at [facility], [place] [address], [place], [place] [address]   Glucose-Capillary 10/21/2021 148 (H)  70 - 99 mg/dL Final   Glucose reference range applies only to samples taken after fasting for at least 8 hours.   SARS Coronavirus 2 by RT PCR 10/22/2021 NEGATIVE  NEGATIVE Final   Comment: (NOTE) SARS-CoV-2 target nucleic acids are NOT DETECTED.  The SARS-CoV-2 RNA is generally detectable in upper and lower respiratory specimens during the acute phase of infection. The lowest concentration of SARS-CoV-2 viral copies this assay can detect is 250 copies / mL. A negative result does not preclude SARS-CoV-2 infection and should not be used as the sole basis for treatment or other patient management decisions.  A negative result may occur with improper specimen  collection / handling, submission of specimen other than nasopharyngeal swab, presence of viral mutation(s) within the areas targeted by this assay, and inadequate number of viral copies (<250 copies / mL). A negative result  must be combined with clinical observations, patient history, and epidemiological information.  Fact Sheet for Patients:   https://www.patel.info/  Fact Sheet for Healthcare Providers: https://hall.com/  This test is not yet approved or                           cleared by the [place] FDA and has been authorized for detection and/or diagnosis of SARS-CoV-2 by FDA under an Emergency Use Authorization (EUA).  This EUA will remain in effect (meaning this test can be used) for the duration of the COVID-19 declaration under Section 564(b)(1) of the Act, 21 U.S.C. section 360bbb-3(b)(1), unless the authorization is terminated or revoked sooner.  Performed at [facility], [place] [address] [place], [place] [address]   Admission on 10/20/2021, Discharged on 10/20/2021  Component Date Value Ref Range Status   SARS Coronavirus 2 by RT PCR 10/20/2021 NEGATIVE  NEGATIVE Final   Comment: (NOTE) SARS-CoV-2 target nucleic acids are NOT DETECTED.  The SARS-CoV-2 RNA is generally detectable in upper respiratory specimens during the acute phase of infection. The lowest concentration of SARS-CoV-2 viral copies this assay can detect is 138 copies/mL. A negative result does not preclude SARS-Cov-2 infection and should not be used as the sole basis for treatment or other patient management decisions. A negative result may occur with  improper specimen collection/handling, submission of specimen other than nasopharyngeal swab, presence of viral mutation(s) within the areas targeted by this assay, and inadequate number of viral copies(<138 copies/mL). A negative result must be combined with clinical observations,  patient history, and epidemiological information. The expected result is Negative.  Fact Sheet for Patients:  EntrepreneurPulse.com.au  Fact Sheet for Healthcare Providers:  IncredibleEmployment.be  This test is no                          t yet approved or cleared by the [place] FDA and  has been authorized for detection and/or diagnosis of SARS-CoV-2 by FDA under an Emergency Use Authorization (EUA). This EUA will remain  in effect (meaning this test can be used) for the duration of the COVID-19 declaration under Section 564(b)(1) of the Act, 21 U.S.C.section 360bbb-3(b)(1), unless the authorization is terminated  or revoked sooner.       Influenza A by PCR 10/20/2021 NEGATIVE  NEGATIVE Final   Influenza B by PCR 10/20/2021 NEGATIVE  NEGATIVE Final   Comment: (NOTE) The Xpert Xpress SARS-CoV-2/FLU/RSV plus assay is intended as an aid in the diagnosis of influenza from Nasopharyngeal swab specimens and should not be used as a sole basis for treatment. Nasal washings and aspirates are unacceptable for Xpert Xpress SARS-CoV-2/FLU/RSV testing.  Fact Sheet for Patients: EntrepreneurPulse.com.au  Fact Sheet for Healthcare Providers: IncredibleEmployment.be  This test is not yet approved or cleared by the [place] FDA and has been authorized for detection and/or diagnosis of SARS-CoV-2 by FDA under an Emergency Use Authorization (EUA). This EUA will remain in effect (meaning this test can be used) for the duration of the COVID-19 declaration under Section 564(b)(1) of the Act, 21 U.S.C. section 360bbb-3(b)(1), unless the authorization is terminated or revoked.  Performed at [facility], [place] [address], [place], [place] [address]    WBC 10/20/2021 6.0  4.0 - 10.5 K/uL Final   RBC 10/20/2021 3.65 (L)  3.87 - 5.11 MIL/uL Final   Hemoglobin 10/20/2021 11.7 (L)  12.0 - 15.0 g/dL Final  HCT  10/20/2021 34.0 (L)  36.0 - 46.0 % Final   MCV 10/20/2021 93.2  80.0 - 100.0 fL Final   MCH 10/20/2021 32.1  26.0 - 34.0 pg Final   MCHC 10/20/2021 34.4  30.0 - 36.0 g/dL Final   RDW 10/20/2021 13.5  11.5 - 15.5 % Final   Platelets 10/20/2021 241  150 - 400 K/uL Final   nRBC 10/20/2021 0.0  0.0 - 0.2 % Final   Neutrophils Relative % 10/20/2021 67  % Final   Neutro Abs 10/20/2021 4.0  1.7 - 7.7 K/uL Final   Lymphocytes Relative 10/20/2021 25  % Final   Lymphs Abs 10/20/2021 1.5  0.7 - 4.0 K/uL Final   Monocytes Relative 10/20/2021 8  % Final   Monocytes Absolute 10/20/2021 0.5  0.1 - 1.0 K/uL Final   Eosinophils Relative 10/20/2021 0  % Final   Eosinophils Absolute 10/20/2021 0.0  0.0 - 0.5 K/uL Final   Basophils Relative 10/20/2021 0  % Final   Basophils Absolute 10/20/2021 0.0  0.0 - 0.1 K/uL Final   Immature Granulocytes 10/20/2021 0  % Final   Abs Immature Granulocytes 10/20/2021 0.02  0.00 - 0.07 K/uL Final   Performed at [facility], [place] [address], [place], [place] [address]   Sodium 10/20/2021 138  135 - 145 mmol/L Final   Potassium 10/20/2021 3.9  3.5 - 5.1 mmol/L Final   Chloride 10/20/2021 103  98 - 111 mmol/L Final   CO2 10/20/2021 24  22 - 32 mmol/L Final   Glucose, Bld 10/20/2021 99  70 - 99 mg/dL Final   Glucose reference range applies only to samples taken after fasting for at least 8 hours.   BUN 10/20/2021 11  6 - 20 mg/dL Final   Creatinine, Ser 10/20/2021 0.90  0.44 - 1.00 mg/dL Final   Calcium 10/20/2021 9.6  8.9 - 10.3 mg/dL Final   Total Protein 10/20/2021 8.2 (H)  6.5 - 8.1 g/dL Final   Albumin 10/20/2021 4.5  3.5 - 5.0 g/dL Final   AST 10/20/2021 23  15 - 41 U/L Final   ALT 10/20/2021 16  0 - 44 U/L Final   Alkaline Phosphatase 10/20/2021 41  38 - 126 U/L Final   Total Bilirubin 10/20/2021 0.9  0.3 - 1.2 mg/dL Final   GFR, Estimated 10/20/2021 >60  >60 mL/min Final   Comment: (NOTE) Calculated using the CKD-EPI Creatinine Equation (2021)    Anion  gap 10/20/2021 11  5 - 15 Final   Performed at [facility] [address], [place], [place] [address]   Hgb A1c MFr Bld 10/20/2021 5.2  4.8 - 5.6 % Final   Comment: (NOTE) Pre diabetes:          5.7%-6.4%  Diabetes:              >6.4%  Glycemic control for   <7.0% adults with diabetes    Mean Plasma Glucose 10/20/2021 102.54  mg/dL Final   Performed at [facility], [place] [address], [place], [place] [address]   Magnesium 10/20/2021 2.0  1.7 - 2.4 mg/dL Final   Performed at [facility] [address] [place], [place] [address]   Alcohol, Ethyl (B) 10/20/2021 <10  <10 mg/dL Final   Comment: (NOTE) Lowest detectable limit for serum alcohol is 10 mg/dL.  For medical purposes only. Performed at [facility], [place] [address], [place], [place] [address]    Cholesterol  10/20/2021 180  0 - 200 mg/dL Final   Triglycerides 10/20/2021 14  <150 mg/dL Final   HDL 10/20/2021 71  >40 mg/dL Final   Total CHOL/HDL Ratio 10/20/2021 2.5  RATIO Final   VLDL 10/20/2021 3  0 - 40 mg/dL Final   LDL Cholesterol 10/20/2021 106 (H)  0 - 99 mg/dL Final   Comment:        Total Cholesterol/HDL:CHD Risk Coronary Heart Disease Risk Table                     Men   Women  1/2 Average Risk   3.4   3.3  Average Risk       5.0   4.4  2 X Average Risk   9.6   7.1  3 X Average Risk  23.4   11.0        Use the calculated Patient Ratio above and the CHD Risk Table to determine the patient's CHD Risk.        ATP III CLASSIFICATION (LDL):  <100     mg/dL   Optimal  100-129  mg/dL   Near or Above                    Optimal  130-159  mg/dL   Borderline  160-189  mg/dL   High  >190     mg/dL   Very High Performed at [facility] [address], [place], [place] [address]    TSH 10/20/2021 1.121  0.350 - 4.500 uIU/mL Final   Comment: Performed by a 3rd Generation assay with a functional sensitivity of <=0.01 uIU/mL. Performed at [facility], [place] [address],  [place], [place] [address]    Prolactin 10/20/2021 15.7  4.8 - 23.3 ng/mL Final   Comment: (NOTE) Performed At: [facility] [place], [place] [id number] Rush Farmer MD ON:[id number]    Valproic Acid Lvl 10/20/2021 <10 (L)  50.0 - 100.0 ug/mL Final   Comment: RESULT CONFIRMED BY MANUAL DILUTION Performed at [facility], [place] [address], [place], [place] [address]    Lithium Lvl 10/20/2021 <0.06 (L)  0.60 - 1.20 mmol/L Final   Performed at [facility] [address] [place],  [address]   SARSCOV2ONAVIRUS 2 AG 10/20/2021 NEGATIVE  NEGATIVE Final   Comment: (NOTE) SARS-CoV-2 antigen NOT DETECTED.   Negative results are presumptive.  Negative results do not preclude SARS-CoV-2 infection and should not be used as the sole basis for treatment or other patient management decisions, including infection  control decisions, particularly in the presence of clinical signs and  symptoms consistent with COVID-19, or in those who have been in contact with the virus.  Negative results must be combined with clinical observations, patient history, and epidemiological information. The expected result is Negative.  Fact Sheet for Patients: HandmadeRecipes.com.cy  Fact Sheet for Healthcare Providers: FuneralLife.at  This test is not yet approved or cleared by the [place] FDA and  has been authorized for detection and/or diagnosis of SARS-CoV-2 by FDA under an Emergency Use Authorization (EUA).  This EUA will remain in effect (meaning this test can be used) for the duration of  the COV                          ID-19 declaration under Section 564(b)(1) of the Act, 21 U.S.C. section 360bbb-3(b)(1), unless the authorization is terminated  or revoked sooner.      Allergies: Neurontin [gabapentin], Trazodone and nefazodone, Fluphenazine, and Haldol [haloperidol]  PTA Medications: (Not in a hospital  admission)   Medical Decision Making  Patient placed under IVC due to signs of acute mania with psychotic features. Patient is an elope risk. Patient recommended for inpatient psychiatric treatment. Patient admitted to the [facility] continuous assessment unit while she awaits inpatient psychiatric placement. CSW to fax patient out for appropriate placement.  Lab Orders         Resp Panel by RT-PCR (Flu A&B, Covid) Anterior Nasal Swab         CBC with Differential/Platelet         Comprehensive metabolic panel         Ethanol         Valproic acid level         Lipid panel         Hemoglobin A1c         TSH         POCT Urine Drug Screen - (I-Screen)         POC urine preg, ED         POC SARS Coronavirus 2 Ag    EKG  Restart home medications Depakote 1000 mg p.o. nightly for mood stabilization/mania Seroquel 400 mg p.o. nightly for mood stabilization/psychotic features Benztropine 1 mg p.o. twice daily for possible adverse effects/EPS Cozaar 50 mg p.o. daily for hypertension Metoprolol 25 mg p.o. nightly for hypertension Metformin 500 mg twice daily for diabetes type 2  Recommendations  Based on my evaluation the patient does not appear to have an emergency medical condition.  Marissa Calamity, NP 11/21/21  10:33 AM

## 2021-11-21 NOTE — ED Notes (Signed)
Pt at the nurses desk yelling at staff. Pt stated she was trying to get the attention of the LCSW thinking she was the doctor. 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 Pt stated that she works for [employer] and works for the [employer]. Staff attempted to redirect, unsuccessful. Staff attempted to encourage pt to lay down to allow the Geodon to work. Safety maintained and will continue to monitor.

## 2021-11-21 NOTE — ED Notes (Addendum)
Pt escorted to get her belongings from locker #17. Pt escorted to [facility]. Paperwork, including EMTALA given to [employer]. Pt departed facility via [place] sheriff's department to be escorted to [facility]. Previous report given to [employer]. Safety maintained.

## 2021-11-21 NOTE — ED Notes (Signed)
Patient Alert to person. Patient is hyper religious. Patient denies SI/HI and AVH. Patient denies any physical complaints when asked. No acute distress noted. Support and encouragement provided. Routine safety checks conducted according to facility protocol. Encouraged patient to notify staff if thoughts of harm toward self or others arise. Patient verbalize understanding and agreement. Will continue to monitor for safety.

## 2021-11-21 NOTE — ED Notes (Signed)
Pt manic behavior. Coming up to the nurses desk several times, at times yelling at staff, attempted to not allow pharmacist into the nurses station, began yelling at the pharmacist, requesting to leave to go see her preacher tonight and her husband. Informed staff that she could not hear and needed them to speak louder, when staff began speaking louder pt began yelling over the staff. Staff will attempt to calm pt down by talking to her, redirecting her actions/thoughts, unsuccessful. Pt took a shower when she first came onto the unit and put on 2 gowns. Pt talks in Tongue and praying regarding taking medications, which she refused. Pt allowed this Probation officer to [employer] IM Geodon in the buttocks per her request in the restroom with another staff member present. Pt is now thanking staff member for the Geodon because she "needs to rest". Staff has encouraged pt to lay down and go to sleep several times while in my care, unsuccessful. Safety maintained and will continue to monitor.
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
## 2021-11-21 NOTE — Progress Notes (Signed)
Pt was accepted to [facility]; [place] 11/21/21  Pt meets inpatient criteria per Darrol Angel, NP  Attending Physician will be Dr. Jonelle Sports  Report can be called to: [phone]  Care Team notified: Darrol Angel, NP, Anda Latina, [place], MSW, [facility] 11/21/2021 1:41 PM
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
## 2021-11-21 NOTE — Discharge Instructions (Signed)
Follow-up recommendations:  Activity:  Normal, as tolerated Diet:  Per PCP recommendation  Patient is instructed prior to discharge to: Take all medications as prescribed by her mental healthcare provider. Report any adverse effects and/or reactions from the medicines to her outpatient provider promptly. Patient has been instructed & cautioned: To not engage in alcohol and or illegal drug use while on prescription medicines.  In the event of worsening symptoms, patient is instructed to call the crisis hotline at 988, 911 and or go to the nearest ED for appropriate evaluation and treatment of symptoms. To follow-up with her primary care provider for your other medical issues, concerns and or health care needs.  

## 2022-07-24 ENCOUNTER — Other Ambulatory Visit: Payer: Self-pay

## 2022-07-24 ENCOUNTER — Ambulatory Visit (HOSPITAL_COMMUNITY)
Admission: EM | Admit: 2022-07-24 | Discharge: 2022-07-24 | Disposition: A | Discharge status: Home / Self Care | Payer: 59 | Attending: Family | Admitting: Family

## 2022-07-24 ENCOUNTER — Emergency Department (HOSPITAL_COMMUNITY)
Admission: EM | Admit: 2022-07-24 | Discharge: 2022-07-24 | Disposition: A | Discharge status: Home / Self Care | Payer: 59 | Attending: Emergency Medicine | Admitting: Emergency Medicine

## 2022-07-24 ENCOUNTER — Encounter (HOSPITAL_COMMUNITY): Payer: Self-pay

## 2022-07-24 DIAGNOSIS — F22 Delusional disorders: Secondary | ICD-10-CM | POA: Insufficient documentation

## 2022-07-24 DIAGNOSIS — F259 Schizoaffective disorder, unspecified: Secondary | ICD-10-CM | POA: Diagnosis not present

## 2022-07-24 DIAGNOSIS — Z7984 Long term (current) use of oral hypoglycemic drugs: Secondary | ICD-10-CM | POA: Diagnosis not present

## 2022-07-24 DIAGNOSIS — F250 Schizoaffective disorder, bipolar type: Secondary | ICD-10-CM

## 2022-07-24 DIAGNOSIS — Z79899 Other long term (current) drug therapy: Secondary | ICD-10-CM | POA: Insufficient documentation

## 2022-07-24 LAB — COMPREHENSIVE METABOLIC PANEL
ALT: 12 U/L (ref 0–44)
AST: 15 U/L (ref 15–41)
Albumin: 3.8 g/dL (ref 3.5–5.0)
Alkaline Phosphatase: 39 U/L (ref 38–126)
Anion gap: 8 (ref 5–15)
BUN: 8 mg/dL (ref 6–20)
CO2: 23 mmol/L (ref 22–32)
Calcium: 9 mg/dL (ref 8.9–10.3)
Chloride: 102 mmol/L (ref 98–111)
Creatinine, Ser: 0.9 mg/dL (ref 0.44–1.00)
GFR, Estimated: >60 mL/min (ref >60)
Glucose, Bld: 130 mg/dL — ABNORMAL HIGH (ref 70–99)
Potassium: 3.5 mmol/L (ref 3.5–5.1)
Sodium: 133 mmol/L — ABNORMAL LOW (ref 135–145)
Total Bilirubin: 0.7 mg/dL (ref 0.3–1.2)
Total Protein: 7.7 g/dL (ref 6.5–8.1)

## 2022-07-24 LAB — SALICYLATE LEVEL: Salicylate Lvl: <7 mg/dL — ABNORMAL LOW (ref 7.0–30.0)

## 2022-07-24 LAB — CBC
HCT: 37 % (ref 36.0–46.0)
Hemoglobin: 12.6 g/dL (ref 12.0–15.0)
MCH: 32 pg (ref 26.0–34.0)
MCHC: 34.1 g/dL (ref 30.0–36.0)
MCV: 93.9 fL (ref 80.0–100.0)
Platelets: 195 10*3/uL (ref 150–400)
RBC: 3.94 MIL/uL (ref 3.87–5.11)
RDW: 13.3 % (ref 11.5–15.5)
WBC: 4 10*3/uL (ref 4.0–10.5)
nRBC: 0 % (ref 0.0–0.2)

## 2022-07-24 LAB — HCG, SERUM, QUALITATIVE: Preg, Serum: NEGATIVE

## 2022-07-24 LAB — ACETAMINOPHEN LEVEL: Acetaminophen (Tylenol), Serum: <10 ug/mL — ABNORMAL LOW (ref 10–30)

## 2022-07-24 LAB — ETHANOL: Alcohol, Ethyl (B): <10 mg/dL (ref <10)

## 2022-07-24 MED ORDER — QUETIAPINE FUMARATE ER 200 MG PO TB24: 200.0000 mg | ORAL_TABLET | Freq: Every day | ORAL | 0 refills | Status: DC

## 2022-07-24 NOTE — ED Provider Notes (Signed)
Behavioral Health Urgent Care Medical Screening Exam  Patient Name: [name] [name] MRN: [id number] Date of Evaluation: 07/24/22 Chief Complaint:   Diagnosis:  Final diagnoses:  Schizoaffective disorder, bipolar type (HCC)    History of Present illness: [name] [name] is a 42 y.o. female. Patient presents voluntarily to [facility] behavioral health for walk-in assessment.   Patient is assessed by nurse practitioner face-to-face. She is seated in assessment area, no acute distress. She is appropriately groomed.    Patient presents today seeking medication refill and outpatient psychiatry follow-up.  She recently relocated from [facility] back to her previous home in [place] 3 weeks ago.  She left her medication in [place] and does not have transportation to return.  Patient reports chronic stressors include her children removed from her care approximately 3 years ago.  She is scheduled to attend a court hearing later this month regarding custody.  She is seeking a psychological evaluation as directed by her attorney.  [name] [name] presents with depressed mood, congruent affect. She  denies suicidal and homicidal ideations.  No history of suicide attempts, no history of nonsuicidal self-harm.  She contracts verbally for safety at this time.  Patient has normal speech and behavior.  She  denies auditory and visual hallucinations.  Patient is able to converse coherently with goal-directed thoughts and no distractibility or preoccupation.  Denies symptoms of paranoia.  Objectively there is no evidence of psychosis/mania or delusional thinking.  Chart reviewed on 07/24/2022.  Previous diagnoses include schizoaffective disorder, schizophrenia, bipolar affective disorder, psychosis, manic behavior and adjustment disorder.  She has been stable on Seroquel 200 mg nightly, per her report,  for 6 months.  She is not linked with outpatient psychiatry currently would like to be  followed for medication management and individual counseling.  She endorses history of multiple previous inpatient psychiatric hospitalizations, most recently at [facility] 10/2021 and at [facility] 11/2021.  No family mental health or addiction history reported.  [name] [name] moved back to [place] after a break-up with her significant other 3 weeks ago.  She resides with her pastor and a roommate.  She denies access to weapons.  She receives disability income and is also seeking employment, several applications submitted.  She denies alcohol and substance use.  Patient endorses average sleep and appetite.  Patient offered support and encouragement.  She gives verbal consent to speak with her minister, with whom she lives, [facility] phone number [phone].  Spoke with [place] who denies safety concerns.  He will assist patient with picking up medications and attending outpatient psychiatry appointments.   Patient and family are educated and verbalize understanding of mental health resources and other crisis services in the community. They are instructed to call 911 and present to the nearest emergency room should patient experience any suicidal/homicidal ideation, auditory/visual/hallucinations, or detrimental worsening of mental health condition.     Flowsheet Row ED from 07/24/2022 in [facility] Most recent reading at 07/24/2022  3:44 PM ED from 07/24/2022 in [facility] Emergency Department at [facility] Most recent reading at 07/24/2022 12:40 PM ED from 11/21/2021 in [facility] Most recent reading at 11/21/2021  9:09 AM  C-SSRS RISK CATEGORY No Risk No Risk No Risk       Psychiatric Specialty Exam  Presentation  General Appearance:Appropriate for Environment; Casual  Eye Contact:Good  Speech:Clear and Coherent; Normal Rate  Speech Volume:Normal  Handedness:Right   Mood and Affect   Mood: Euthymic  Affect: Appropriate; Congruent   Thought Process  Thought Processes: Coherent; Goal Directed; Linear  Descriptions of Associations:Intact  Orientation:Full (Time, Place and Person)  Thought Content:Logical; WDL  Diagnosis of Schizophrenia or Schizoaffective disorder in past: Yes (schizoaffective per chart)  Duration of Psychotic Symptoms: -- Rich Reining)  Hallucinations:None  Ideas of Reference:None  Suicidal Thoughts:No  Homicidal Thoughts:No   Sensorium  Memory: Immediate Fair; Recent Fair  Judgment: Intact  Insight: Fair   Art therapist  Concentration: Good  Attention Span: Good  Recall: Good  Fund of Knowledge: Good  Language: Good   Psychomotor Activity  Psychomotor Activity: Normal   Assets  Assets: Communication Skills; Desire for Improvement; Financial Resources/Insurance; Housing; Social Support; Resilience; Physical Health   Sleep  Sleep: Fair  Number of hours: No data recorded  Physical Exam: Physical Exam Vitals and nursing note reviewed.  Constitutional:      Appearance: Normal appearance. She is well-developed.  HENT:     Head: Normocephalic and atraumatic.     Nose: Nose normal.  Cardiovascular:     Rate and Rhythm: Normal rate.  Pulmonary:     Effort: Pulmonary effort is normal.  Musculoskeletal:        General: Normal range of motion.     Cervical back: Normal range of motion.  Skin:    General: Skin is warm and dry.  Neurological:     Mental Status: She is alert and oriented to person, place, and time.  Psychiatric:        Attention and Perception: Attention and perception normal.        Mood and Affect: Affect normal. Mood is depressed.        Speech: Speech normal.        Behavior: Behavior normal. Behavior is cooperative.        Thought Content: Thought content normal.        Cognition and Memory: Cognition and memory normal.    Review of Systems  Constitutional: Negative.    HENT: Negative.    Eyes: Negative.   Respiratory: Negative.    Cardiovascular: Negative.   Gastrointestinal: Negative.   Genitourinary: Negative.   Musculoskeletal: Negative.   Skin: Negative.   Neurological: Negative.   Psychiatric/Behavioral:  Positive for depression.    Blood pressure 104/71, pulse 100, temperature 98.3 F (36.8 C), temperature source Oral, resp. rate 18, height 5\' 2"  (1.575 m), weight 156 lb (70.8 kg), SpO2 92%, unknown if currently breastfeeding. Body mass index is 28.53 kg/m.  Musculoskeletal: Strength & Muscle Tone: within normal limits Gait & Station: normal Patient leans: N/A   [facility] MSE Discharge Disposition for Follow up and Recommendations: Based on my evaluation the patient does not appear to have an emergency medical condition and can be discharged with resources and follow up care in outpatient services for Medication Management and Individual Therapy Follow-up with outpatient psychiatry, resources provided.  Reviewed [facility] behavioral health, walk-in hours available. Medication: -Quetiapine 200 mg nightly/mood  Lenard Lance, FNP 07/24/2022, 4:19 PM

## 2022-07-24 NOTE — Discharge Instructions (Addendum)
You were seen today for consideration of a psychiatric evaluation, and restarting your medications.  After careful evaluation, it was determined, that you do not have an acute need for admission to the hospital, however we do agree that you will need to be evaluated, and to have meds restarted.  This can best be accomplished at the [facility] urgent care [facility]).  Go to [facility] urgent care, they have walk in hours, and you can be seen today.   We recommend you follow up with them, as needed, for your mental health/behavioral health needs.   [facility] Address: [address] [place], [place] [address] Phone: [phone]  We have also included handouts of different shelters in the area, and counselors that you can consider talking to.
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
## 2022-07-24 NOTE — ED Notes (Signed)
Pt belongings placed in locker 2 in purple

## 2022-07-24 NOTE — Discharge Instructions (Addendum)
Patient is instructed prior to discharge to: Take all medications as prescribed by his/her mental healthcare provider. Report any adverse effects and or reactions from the medicines to his/her outpatient provider promptly. Keep all scheduled appointments, to ensure that you are getting refills on time and to avoid any interruption in your medication.  If you are unable to keep an appointment call to reschedule.  Be sure to follow-up with resources and follow-up appointments provided.  Patient has been instructed & cautioned: To not engage in alcohol and or illegal drug use while on prescription medicines. In the event of worsening symptoms, patient is instructed to call the crisis hotline, 911 and or go to the nearest ED for appropriate evaluation and treatment of symptoms. To follow-up with his/her primary care provider for your other medical issues, concerns and or health care needs.  Information: -National Suicide Prevention Lifeline 1-800-SUICIDE or 208 145 8437.  -988 offers 24/7 access to trained crisis counselors who can help people experiencing mental health-related distress. People can call or text 988 or chat 988lifeline.org for themselves or if they are worried about a loved one who may need crisis support.
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
## 2022-08-02 ENCOUNTER — Encounter (HOSPITAL_COMMUNITY): Payer: Self-pay | Admitting: Registered Nurse

## 2022-08-02 ENCOUNTER — Ambulatory Visit (HOSPITAL_COMMUNITY)
Admission: EM | Admit: 2022-08-02 | Discharge: 2022-08-02 | Disposition: A | Discharge status: Home / Self Care | Payer: 59 | Attending: Registered Nurse | Admitting: Registered Nurse

## 2022-08-02 DIAGNOSIS — Z79899 Other long term (current) drug therapy: Secondary | ICD-10-CM | POA: Insufficient documentation

## 2022-08-02 DIAGNOSIS — F250 Schizoaffective disorder, bipolar type: Secondary | ICD-10-CM | POA: Diagnosis present

## 2022-08-02 DIAGNOSIS — Z760 Encounter for issue of repeat prescription: Secondary | ICD-10-CM

## 2022-08-02 MED ORDER — LORAZEPAM 1 MG PO TABS
1.0000 mg | ORAL_TABLET | ORAL | Status: DC | PRN
  Administered 2022-08-02: 1 mg via ORAL
  Filled 2022-08-02: qty 1

## 2022-08-02 MED ORDER — DIVALPROEX SODIUM ER 500 MG PO TB24
1000.0000 mg | ORAL_TABLET | Freq: Once | ORAL | Status: AC
  Administered 2022-08-02: 1000 mg via ORAL
  Filled 2022-08-02: qty 2

## 2022-08-02 MED ORDER — DIVALPROEX SODIUM ER 500 MG PO TB24: 1000.0000 mg | ORAL_TABLET | Freq: Every day | ORAL | Status: DC

## 2022-08-02 MED ORDER — DIVALPROEX SODIUM ER 500 MG PO TB24: 1000.0000 mg | ORAL_TABLET | Freq: Every day | ORAL | 0 refills | Status: DC
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
## 2022-08-02 NOTE — Progress Notes (Signed)
   08/02/22 1036  [facility] Triage Screening (Walk-ins at [facility] only)  How Did You Hear About [place]? Self  What Is the Reason for Your Visit/Call Today? [name] [name] is a 42 year old female present to [facility] accompanied by her pastor who life after the patient was registered. The patient is hypomanic. Per the pastor the patient has not been sleep for a least 72 hours hours. Patient conversation is illogical, hypo religious, and word salad. No concern for SI/HI and auditory/visual hallucinations.  How Long Has This Been Causing You Problems? <Week  Have You Recently Had Any Thoughts About Hurting Yourself? No  Are You Planning to Commit Suicide/Harm Yourself At This time? No  Have you Recently Had Thoughts About Hurting Someone [name]? No  Are You Planning To Harm Someone At This Time? No  Are you currently experiencing any auditory, visual or other hallucinations? No  Have You Used Any Alcohol or Drugs in the Past 24 Hours? No  Do you have any current medical co-morbidities that require immediate attention? No  Clinician description of patient physical appearance/behavior: Patient is over dressed for the weather  What Do You Feel Would Help You the Most Today? Medication(s);Treatment for Depression or other mood problem  If access to [facility] Urgent Care was not available, would you have sought care in the Emergency Department? No  Determination of Need Routine (7 days)  Options For Referral Medication Management

## 2022-08-02 NOTE — Discharge Instructions (Addendum)
You were given Depakote ER 1000 mg and Ativan 1 mg during your visit today.  You will not need to take your bedtime Depakote today.     [facility]: Outpatient psychiatric Services:   Please see the walk in hours listed below.  Medication Management New Patient needing Medication Management Walk-in, and Existing Patients needing to see a provider for management coming as a walk in   Monday thru Friday 8:00 AM first come first serve until slots are full.  Recommend being there by 7:15 AM to ensure a slot is open.  Therapy New Patient Therapy Intake and Existing Patients needing to see therapist coming in as a walk in.   Monday, Wednesday, and Thursday morning at 8:00 am first come first serve.  Recommend being there by 7:15 AM to ensure a slot is open.    Every 1st, 2nd, and 3rd Friday at 1:00 PM first come first serve until slots are full.  Will still need to come in that morning at 7:15 AM to get registered for an afternoon slot.  For all walk-ins we ask that you arrive by 7:15 am because patients will be seen in there order of arrival (FIRST COME FIRST SERVE) Availability is limited, therefore you may not be seen on the same day that you walk in if all slots are full.    Our goal is to serve and meet the needs of our community to the best of our ability.         [facility] Phone: [phone] Physical Address:  [address] [place], [place]  [address]  Outpatient Services Life can be a challenge for [place] all. [facility]'s outpatient services offer a caring and experienced team of professionals who help people take the first step, which is often the most difficult. Together, we develop a well-defined and customized plan for each person that meets the individual's needs and goals. Each plan includes evidence-based practices as proven strategies that work. From board-certified psychiatrists,  registered nurses, therapists, and outpatient office administrative professionals--all care and want to help you and your loved ones in every way possible to ensure you succeed.  Open Access:   One way we ensure we get people the help they need when they request is is through Open Access. This service encourages individuals who are in dire need of our services and are new to [facility] to simply walk in or call us for virtual options, Monday through Friday between 8 a.m. and 3 p.m. On the same day of contact, if the individual has time to do so, he/she/they will complete patient registration and a comprehensive clinical assessment with a therapist. The assessment will provide treatment recommendations and the individual will leave with an appointment for the next service or a referral to the proper level of care.  While this process takes a few hours and is longer than a traditional appointment, it reduces what could otherwise be months of waiting for help or an appointment.   Telehealth Services:  [facility]'s telehealth services provide a safe, secure, and easy way to connect with a therapist or mental health provider for an individual or group therapy appointment. Click here to learn more about how [facility]'s telehealth services provide an important treatment option. These services may be accessed from the comfort of an individual's home, or at one of [facility]'s behavioral health offices such as this one where an individual may use on-site equipment for the visit.  Telehealth Services   A SAFE, SECURE, CONVENIENT TREATMENT OPTION:  [facility] telehealth services provide you with a safe, secure, and easy way to connect with your therapist or mental health provider for an individual or group therapy appointment.  Using Psychologist, prison and probation services, telehealth appointments allow you to meet with [employer], therapists, nurse practitioners, and psychiatrists from your desktop or laptop computer, cell phone, or  tablet device. Telehealth visits are compliant with all Health Insurance Portability and Accountability Act (HIPAA) requirements and you can complete a telehealth visit from just about anywhere using internet or wi-fi access.  HOW DOES IT WORK?  [facility] uses the [employer] platform to host telehealth appointments. Prior to your scheduled visit, you will receive a direct link via text or email which will take you to your provider's online waiting room. Simply click that link at your appointment time and your provider will be notified that you've arrived. He or she will meet you online and you will complete your visit. Your provider may also have resources and information posted in his or her virtual waiting room which you may find helpful throughout your treatment.  In addition, you may receive a reminder telephone call from a [facility] team member in the days leading up to your appointment. During that call, you will have an opportunity to provide important health information and medication updates which may save time during your scheduled appointment.     WHO USES TELEHEALTH SERVICES?  Telehealth services provide an alternative to in-person, face-to-face treatment for individuals receiving outpatient behavioral health services. At [facility], telehealth visits may also be used by individuals receiving Assertive Community Treatment (ACT) Team and Individual Placement and Support (IPS) services and other community-based, specialized services as needed. Telehealth services are also used for group therapy sessions, allowing people we support to connect during treatment with others who have similar experiences.

## 2022-08-02 NOTE — ED Notes (Signed)
Patient refused retake of BP manually and reassessment of pulse. Patient stated " I stay away from young people because they take people men, so no you can not."

## 2022-08-02 NOTE — ED Provider Notes (Signed)
Behavioral Health Urgent Care Medical Screening Exam  Patient Name: [name] [name] MRN: [id number] Date of Evaluation: 08/02/22 Chief Complaint:   Diagnosis:  Final diagnoses:  Schizoaffective disorder, bipolar type (HCC)    History of Present illness:[name] [name] is a 42 y/o female with a psychiatric history of schizoaffective disorder bipolar type who presented to [facility] asa a walk in (dropped off by her roommate [name] [name])  requesting medication refill of Depakote.    [name] [name], 42 y.o., female patient seen face to face by this provider, chart reviewed, and consulted with Dr. Jannifer Franklin on 08/02/22.  On evaluation [name] [name] reports Patient is seated in assessment area dressed appropriate for weather with no noted distress.  Patient states that she needs a refill on her Depakote.  She reports she was seen recently and was given a prescription for Seroquel but needs her Depakote.  Reports that Depakote works better.  "I would prefer not to take any medicine but if I have to, I want to take the Depakote.  [name] [name] doesn't' want me to take any medicine."  Patient then states she was dropped off by her roommate because she did not want to go to church "They say I talk to freely and that there are children in the church so I can't do sex orientation that it was inappropriate.  Patient states that she is hearing voices "I hear voices all the time."  Patient denies suicidal/self-harm/homicidal ideation, and paranoia.  She stats that she is living with a roommate and that is who dropped her off today and gave permission to speak to for collateral information.   Patient appears to be at her baseline, hyper religious, chronic delusional thinking that she is a Librarian, academic and a Optician, dispensing.    Collateral Information:  Spoke to [name] [name] via telephone who reports that patient has been out of her Depakote for a week, and he can tell that she is decompensating and wants to know if  she can have a refill.  Informed that patient was seen a week ago and was suggested that she come to walk in appointment for medication management and that could not keep giving refills, understanding voiced.  He states that he is at church and would not get out until 3:00 PM and would be able to pick patient up then.  Encouraged to bring patient to walk in at 7:00 am Monday morning.      Flowsheet Row ED from 08/02/2022 in [facility] Most recent reading at 08/02/2022 10:54 AM ED from 07/24/2022 in [facility] Most recent reading at 07/24/2022  3:44 PM ED from 07/24/2022 in [facility] Emergency Department at [facility] Most recent reading at 07/24/2022 12:40 PM  C-SSRS RISK CATEGORY No Risk No Risk No Risk       Psychiatric Specialty Exam  Presentation  General Appearance:Appropriate for Environment  Eye Contact:Good  Speech:Clear and Coherent; Normal Rate  Speech Volume:Normal  Handedness:Right   Mood and Affect  Mood: Euthymic  Affect: Congruent   Thought Process  Thought Processes: Coherent; Linear; Goal Directed  Descriptions of Associations:Tangential  Orientation:Full (Time, Place and Person)  Thought Content:Logical; Tangential  Diagnosis of Schizophrenia or Schizoaffective disorder in past: Yes (schizoaffective per chart)  Duration of Psychotic Symptoms: -- [name] [name])  Hallucinations:Auditory Patient states she always hears voices  Ideas of Reference:None  Suicidal Thoughts:No  Homicidal Thoughts:No   Sensorium  Memory: Immediate Fair; Recent Fair  Judgment: Intact  Insight: Present  Executive Functions  Concentration: Fair  Attention Span: Fair  Recall: [employer] of Knowledge: Good  Language: Good   Psychomotor Activity  Psychomotor Activity: Normal   Assets  Assets: Communication Skills; Desire for Improvement; Financial Resources/Insurance; Housing; Leisure  Time; Physical Health; Social Support   Sleep  Sleep: Fair  Number of hours: No data recorded  Physical Exam: Physical Exam Psychiatric:        Attention and Perception: She perceives auditory hallucinations.        Mood and Affect: Mood is anxious.        Speech: Speech normal.        Behavior: Behavior is hyperactive.        Thought Content: Thought content is not paranoid. 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        Judgment: Judgment is impulsive.    Review of Systems  Constitutional:        No other complaints voiced  Psychiatric/Behavioral:  Depression: Stable. Hallucinations: States she hears voiced. Substance abuse: Denies. Suicidal ideas: Denies. Nervous/anxious: Stable.        States she needs to get refill on her Depakote  All other systems reviewed and are negative.  Pulse (!) 136, temperature 98.6 F (37 C), temperature source Oral, resp. rate 18, SpO2 99%, unknown if currently breastfeeding. There is no height or weight on file to calculate BMI.  Musculoskeletal: Strength & Muscle Tone: within normal limits Gait & Station: normal Patient leans: N/A   [facility] MSE Discharge Disposition for Follow up and Recommendations: Based on my evaluation the patient does not appear to have an emergency medical condition and can be discharged with resources and follow up care in outpatient services for Medication Management and Individual Therapy  She was given dose of Depakote 1000 mg PO and Ativan 1 mg PO prior to her discharge.  A 5 day prescription was given.  Encouraged to go to [facility] Monday during walk in hours to establish medication management.   Argus Caraher, NP 08/02/2022, 12:17 PM

## 2022-08-04 ENCOUNTER — Ambulatory Visit (HOSPITAL_COMMUNITY)
Admission: EM | Admit: 2022-08-04 | Discharge: 2022-08-04 | Payer: BLUE CROSS/BLUE SHIELD | Attending: Psychiatry | Admitting: Psychiatry

## 2022-08-04 ENCOUNTER — Emergency Department (HOSPITAL_COMMUNITY)
Admission: EM | Admit: 2022-08-04 | Discharge: 2022-08-06 | Disposition: A | Discharge status: Home / Self Care | Payer: 59 | Attending: Emergency Medicine | Admitting: Emergency Medicine

## 2022-08-04 ENCOUNTER — Other Ambulatory Visit: Payer: Self-pay

## 2022-08-04 ENCOUNTER — Encounter (HOSPITAL_COMMUNITY): Payer: Self-pay

## 2022-08-04 DIAGNOSIS — R4689 Other symptoms and signs involving appearance and behavior: Secondary | ICD-10-CM

## 2022-08-04 DIAGNOSIS — R451 Restlessness and agitation: Secondary | ICD-10-CM | POA: Insufficient documentation

## 2022-08-04 DIAGNOSIS — F22 Delusional disorders: Secondary | ICD-10-CM | POA: Diagnosis present

## 2022-08-04 DIAGNOSIS — F29 Unspecified psychosis not due to a substance or known physiological condition: Secondary | ICD-10-CM | POA: Insufficient documentation

## 2022-08-04 DIAGNOSIS — F250 Schizoaffective disorder, bipolar type: Secondary | ICD-10-CM

## 2022-08-04 DIAGNOSIS — Z79899 Other long term (current) drug therapy: Secondary | ICD-10-CM | POA: Diagnosis not present

## 2022-08-04 DIAGNOSIS — Z7984 Long term (current) use of oral hypoglycemic drugs: Secondary | ICD-10-CM | POA: Insufficient documentation

## 2022-08-04 DIAGNOSIS — R456 Violent behavior: Secondary | ICD-10-CM | POA: Diagnosis not present

## 2022-08-04 DIAGNOSIS — F23 Brief psychotic disorder: Secondary | ICD-10-CM

## 2022-08-04 DIAGNOSIS — Z91148 Patient's other noncompliance with medication regimen for other reason: Secondary | ICD-10-CM

## 2022-08-04 LAB — COMPREHENSIVE METABOLIC PANEL
ALT: 17 U/L (ref 0–44)
AST: 29 U/L (ref 15–41)
Albumin: 3.7 g/dL (ref 3.5–5.0)
Alkaline Phosphatase: 43 U/L (ref 38–126)
Anion gap: 13 (ref 5–15)
BUN: 11 mg/dL (ref 6–20)
CO2: 25 mmol/L (ref 22–32)
Calcium: 9.1 mg/dL (ref 8.9–10.3)
Chloride: 101 mmol/L (ref 98–111)
Creatinine, Ser: 0.95 mg/dL (ref 0.44–1.00)
GFR, Estimated: >60 mL/min (ref >60)
Glucose, Bld: 91 mg/dL (ref 70–99)
Potassium: 3.5 mmol/L (ref 3.5–5.1)
Sodium: 139 mmol/L (ref 135–145)
Total Bilirubin: 1.1 mg/dL (ref 0.3–1.2)
Total Protein: 7.4 g/dL (ref 6.5–8.1)

## 2022-08-04 LAB — HCG, SERUM, QUALITATIVE: Preg, Serum: NEGATIVE

## 2022-08-04 LAB — CBC WITH DIFFERENTIAL/PLATELET
Abs Immature Granulocytes: 0.01 10*3/uL (ref 0.00–0.07)
Basophils Absolute: 0 10*3/uL (ref 0.0–0.1)
Basophils Relative: 0 %
Eosinophils Absolute: 0 10*3/uL (ref 0.0–0.5)
Eosinophils Relative: 1 %
HCT: 32.5 % — ABNORMAL LOW (ref 36.0–46.0)
Hemoglobin: 10.6 g/dL — ABNORMAL LOW (ref 12.0–15.0)
Immature Granulocytes: 0 %
Lymphocytes Relative: 30 %
Lymphs Abs: 1.9 10*3/uL (ref 0.7–4.0)
MCH: 30.5 pg (ref 26.0–34.0)
MCHC: 32.6 g/dL (ref 30.0–36.0)
MCV: 93.7 fL (ref 80.0–100.0)
Monocytes Absolute: 1 10*3/uL (ref 0.1–1.0)
Monocytes Relative: 16 %
Neutro Abs: 3.3 10*3/uL (ref 1.7–7.7)
Neutrophils Relative %: 53 %
Platelets: 191 10*3/uL (ref 150–400)
RBC: 3.47 MIL/uL — ABNORMAL LOW (ref 3.87–5.11)
RDW: 13.6 % (ref 11.5–15.5)
WBC: 6.3 10*3/uL (ref 4.0–10.5)
nRBC: 0 % (ref 0.0–0.2)

## 2022-08-04 LAB — ETHANOL: Alcohol, Ethyl (B): <10 mg/dL (ref <10)

## 2022-08-04 LAB — ACETAMINOPHEN LEVEL: Acetaminophen (Tylenol), Serum: <10 ug/mL — ABNORMAL LOW (ref 10–30)

## 2022-08-04 LAB — SALICYLATE LEVEL: Salicylate Lvl: <7 mg/dL — ABNORMAL LOW (ref 7.0–30.0)

## 2022-08-04 MED ORDER — STERILE WATER FOR INJECTION IJ SOLN
INTRAMUSCULAR | Status: AC
  Administered 2022-08-04: 1.2 mL
  Filled 2022-08-04: qty 10

## 2022-08-04 MED ORDER — BENZTROPINE MESYLATE 1 MG PO TABS
1.0000 mg | ORAL_TABLET | Freq: Two times a day (BID) | ORAL | Status: DC
  Administered 2022-08-05 – 2022-08-06 (×2): 1 mg via ORAL
  Filled 2022-08-04 (×3): qty 1

## 2022-08-04 MED ORDER — LOSARTAN POTASSIUM 50 MG PO TABS
50.0000 mg | ORAL_TABLET | Freq: Every day | ORAL | Status: DC
  Administered 2022-08-05 – 2022-08-06 (×2): 50 mg via ORAL
  Filled 2022-08-04 (×2): qty 1

## 2022-08-04 MED ORDER — LORAZEPAM 1 MG PO TABS
1.0000 mg | ORAL_TABLET | Freq: Once | ORAL | Status: AC | PRN
  Administered 2022-08-04: 1 mg via ORAL
  Filled 2022-08-04: qty 1

## 2022-08-04 MED ORDER — ZIPRASIDONE MESYLATE 20 MG IM SOLR
20.0000 mg | Freq: Once | INTRAMUSCULAR | Status: AC
  Administered 2022-08-04: 20 mg via INTRAMUSCULAR
  Filled 2022-08-04: qty 20

## 2022-08-04 MED ORDER — METOPROLOL SUCCINATE ER 25 MG PO TB24
25.0000 mg | ORAL_TABLET | Freq: Every day | ORAL | Status: DC
  Administered 2022-08-04: 25 mg via ORAL
  Filled 2022-08-04: qty 1

## 2022-08-04 MED ORDER — OLANZAPINE 5 MG PO TABS
5.0000 mg | ORAL_TABLET | Freq: Once | ORAL | Status: AC
  Administered 2022-08-04: 5 mg via ORAL
  Filled 2022-08-04: qty 1

## 2022-08-04 MED ORDER — HYDROXYZINE HCL 25 MG PO TABS
25.0000 mg | ORAL_TABLET | Freq: Three times a day (TID) | ORAL | Status: DC | PRN
  Administered 2022-08-04 – 2022-08-05 (×2): 25 mg via ORAL
  Filled 2022-08-04 (×2): qty 1

## 2022-08-04 MED ORDER — METFORMIN HCL ER 500 MG PO TB24
500.0000 mg | ORAL_TABLET | Freq: Two times a day (BID) | ORAL | Status: DC
  Administered 2022-08-05 – 2022-08-06 (×3): 500 mg via ORAL
  Filled 2022-08-04 (×4): qty 1

## 2022-08-04 MED ORDER — DIVALPROEX SODIUM 500 MG PO DR TAB
1000.0000 mg | DELAYED_RELEASE_TABLET | Freq: Every day | ORAL | Status: DC
  Administered 2022-08-04: 1000 mg via ORAL
  Filled 2022-08-04: qty 4

## 2022-08-04 NOTE — ED Notes (Signed)
Placed follow up call to [employer] to ensure pt was still on dispatch list.  Per non emergent line pt remains on the list for transportation.

## 2022-08-04 NOTE — ED Notes (Signed)
Pt very agitated, loudly stating that no one should touch her as she has a lawsuit against all of [place] and a restraining order against all black people. 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 Pt repeatedly stating don't touch me

## 2022-08-04 NOTE — ED Provider Triage Note (Signed)
Emergency Medicine Provider Triage Evaluation Note  [name] [name] , a 42 y.o. female  was evaluated in triage.  Pt complains of medical clearance.  Patient has been IVC by [facility] due to agitation and hostility to staff members.  Patient is reportedly not been on her medications.  Patient was given 1 mg Ativan and 5 mg of Zyprexa prior to transfer.  Patient is unable to answer questions as she was medicated before she was transferred.  Review of Systems  Positive: See HPI Negative: See HPI  Physical Exam  BP 110/79 (BP Location: Right Arm)   Pulse 89   Temp 98.9 F (37.2 C) (Oral)   Resp 18   SpO2 100%  Gen:   Asleep Resp:  Normal effort  MSK:   Response to name Other:  No murmurs rubs or gallops noted, no overlying skin color changes noted  Medical Decision Making  Medically screening exam initiated at 8:00 PM.  Appropriate orders placed.  [name] [name] was informed that the remainder of the evaluation will be completed by another provider, this initial triage assessment does not replace that evaluation, and the importance of remaining in the ED until their evaluation is complete.  Workup initiated, medical clearance labs ordered along with cycled due to reported behavior however still waiting for patient to become more responsive after receiving the medications   Netta Corrigan, Cordelia Poche 08/04/22 2009

## 2022-08-04 NOTE — ED Notes (Signed)
Pt resting quietly with eyes closed.  Even rise and fall of chest.  No distress noted.

## 2022-08-04 NOTE — ED Notes (Signed)
This RN spoke to lab staff. New bloodwork orders to be added on to previously drawn blood

## 2022-08-04 NOTE — ED Provider Notes (Addendum)
[place] EMERGENCY DEPARTMENT AT [facility] Provider Note   CSN: [id number] Arrival date & time: 08/04/22  1931     History  Chief Complaint  Patient presents with   Psychiatric Evaluation    [name] [name] is a 42 y.o. female.  HPI   42 year old female presenting from the behavioral health urgent care due to agitation and hostility with staff.  The patient was administered 1 mg of Ativan and 5 mg of Zyprexa tried to transfer.  She has had tangential speech, not been sleeping, has a history of schizoaffective disorder bipolar type.  Denies any SI or HI.  She was IVC to the [facility].  Unclear if she has been taking her home Depakote 1000 mg nightly.  Recently called the police 7 times, took off all her clothes and was walking around the neighborhood naked.  She has been threatening to hurt others and has been aggressive with different individuals.  She refused to believe the [employer] because she believed that she owns the facility.  The patient has not slept in days.  Home Medications Prior to Admission medications   Medication Sig Start Date End Date Taking? Authorizing Provider  benztropine (COGENTIN) 1 MG tablet Take 1 tablet (1 mg total) by mouth 2 (two) times daily. 11/08/21 12/08/21  Massengill, Harrold Donath, MD  divalproex (DEPAKOTE ER) 500 MG 24 hr tablet Take 2 tablets (1,000 mg total) by mouth at bedtime for 10 days. 08/02/22 08/12/22  Rankin, Shuvon B, NP  hydrOXYzine (ATARAX) 25 MG tablet Take 1 tablet (25 mg total) by mouth 3 (three) times daily as needed for anxiety. 11/21/21   Lamar Sprinkles, MD  losartan (COZAAR) 50 MG tablet Take 1 tablet (50 mg total) by mouth daily. 11/08/21 12/08/21  Massengill, Harrold Donath, MD  metFORMIN (GLUCOPHAGE-XR) 500 MG 24 hr tablet Take 1 tablet (500 mg total) by mouth 2 (two) times daily with a meal. 11/08/21 12/08/21  Massengill, Harrold Donath, MD  metoprolol succinate (TOPROL-XL) 25 MG 24 hr tablet Take 1 tablet (25 mg total) by mouth at bedtime.  11/08/21 12/08/21  Massengill, Harrold Donath, MD  paliperidone (INVEGA SUSTENNA) 234 MG/1.5ML injection Inject 234 mg into the muscle once for 1 dose. Administer on 12-05-2021 12/05/21 12/05/21  Phineas Inches, MD      Allergies    Neurontin [gabapentin], Trazodone and nefazodone, Fluphenazine, and Haldol [haloperidol]    Review of Systems   Review of Systems  Unable to perform ROS: Psychiatric disorder    Physical Exam Updated Vital Signs BP 110/79 (BP Location: Right Arm)   Pulse 89   Temp 98.9 F (37.2 C) (Oral)   Resp 18   SpO2 100%  Physical Exam Vitals and nursing note reviewed.  Constitutional:      General: She is not in acute distress.    Appearance: She is well-developed.  HENT:     Head: Normocephalic and atraumatic.  Eyes:     Conjunctiva/sclera: Conjunctivae normal.  Cardiovascular:     Rate and Rhythm: Normal rate and regular rhythm.  Pulmonary:     Effort: Pulmonary effort is normal. No respiratory distress.     Breath sounds: Normal breath sounds.  Abdominal:     General: There is no distension.     Palpations: Abdomen is soft.  Musculoskeletal:        General: No swelling.     Cervical back: Neck supple.  Skin:    General: Skin is warm and dry.     Capillary Refill: Capillary refill takes less  than 2 seconds.  Neurological:     Mental Status: She is alert.  Psychiatric:        Attention and Perception: She is inattentive.        Mood and Affect: Mood normal.        Speech: Speech is rapid and pressured and tangential.     Comments: Patient denies visual and auditory hallucinations, denies SI or HI     ED Results / Procedures / Treatments   Labs (all labs ordered are listed, but only abnormal results are displayed) Labs Reviewed  CBC WITH DIFFERENTIAL/PLATELET - Abnormal; Notable for the following components:      Result Value   RBC 3.47 (*)    Hemoglobin 10.6 (*)    HCT 32.5 (*)    All other components within normal limits  SALICYLATE LEVEL -  Abnormal; Notable for the following components:   Salicylate Lvl <7.0 (*)    All other components within normal limits  ACETAMINOPHEN LEVEL - Abnormal; Notable for the following components:   Acetaminophen (Tylenol), Serum <10 (*)    All other components within normal limits  COMPREHENSIVE METABOLIC PANEL  ETHANOL  HCG, SERUM, QUALITATIVE  RAPID URINE DRUG SCREEN, HOSP PERFORMED  VALPROIC ACID LEVEL  TSH  T4, FREE    EKG EKG Interpretation Date/Time:  Monday August 04 2022 19:38:54 EDT Ventricular Rate:  88 PR Interval:  160 QRS Duration:  70 QT Interval:  364 QTC Calculation: 440 R Axis:   27  Text Interpretation: Normal sinus rhythm Nonspecific T wave abnormality Abnormal ECG When compared with ECG of 21-Nov-2021 08:50, PREVIOUS ECG IS PRESENT Confirmed by Ernie Avena ([id number]) on 08/04/2022 9:59:26 PM  Radiology No results found.  Procedures Procedures    Medications Ordered in ED Medications  divalproex (DEPAKOTE) DR tablet 1,000 mg (has no administration in time range)  hydrOXYzine (ATARAX) tablet 25 mg (has no administration in time range)  losartan (COZAAR) tablet 50 mg (has no administration in time range)  metFORMIN (GLUCOPHAGE-XR) 24 hr tablet 500 mg (has no administration in time range)  metoprolol succinate (TOPROL-XL) 24 hr tablet 25 mg (has no administration in time range)  benztropine (COGENTIN) tablet 1 mg (has no administration in time range)    ED Course/ Medical Decision Making/ A&P                             Medical Decision Making Amount and/or Complexity of Data Reviewed Labs: ordered.  Risk Prescription drug management.    42 year old female presenting from the behavioral health urgent care due to agitation and hostility with staff.  The patient was administered 1 mg of Ativan and 5 mg of Zyprexa tried to transfer.  She has had tangential speech, not been sleeping, has a history of schizoaffective disorder bipolar type.  Denies any SI or HI.   She was IVC to the [facility].  Unclear if she has been taking her home Depakote 1000 mg nightly.  Recently called the police 7 times, took off all her clothes and was walking around the neighborhood naked.  She has been threatening to hurt others and has been aggressive with different individuals.  She refused to believe the [employer] because she believed that she owns the facility.  The patient has not slept in days.  On arrival, the patient was vitally stable, laboratory workup or medical clearance performed and was generally unremarkable.  Patient mildly anemic with a hemoglobin of 10.6,  otherwise remainder of labs unremarkable.  A Depakote level was ordered.  The patient's home medications were ordered to include recent Depakote prescription for 1000 mg nightly.  Arrives under IVC, first examination completed and home medications were resumed.  Patient appears to be decompensated in the setting of her schizoaffective disorder bipolar type, rapid pressured speech, has not slept in days concerning for potential mania versus psychosis.  TTS consult placed, stable for psychiatric disposition.  Patient was initially redirectable but subsequently refused to change out of her clothing and became increasingly agitated towards staff.  She subsequently required IM Geodon in order for staff to safely get the patient changed out of her personal belongings.   Final Clinical Impression(s) / ED Diagnoses Final diagnoses:  Aggressive behavior  Acute psychosis [facility])    Rx / DC Orders ED Discharge Orders     None           Ernie Avena, MD 08/04/22 2232

## 2022-08-04 NOTE — ED Notes (Signed)
IVC paperwork uploaded to efile. Paperwork in [employer] and purple [place]. Exp. 08/11/22

## 2022-08-04 NOTE — ED Provider Notes (Cosign Needed Addendum)
Behavioral Health Urgent Care Medical Screening Exam  Patient Name: [name] [name] MRN: [id number] Date of Evaluation: 08/04/22 Chief Complaint:  walking naked in neighborhood.  Diagnosis:  Final diagnoses:  Schizoaffective disorder, bipolar type (HCC)    History of Present illness: [name] [name] is a 42 y.o. female patient presented to [facility] as a walk in  accompanied by BHRT (Behavorial Health Response Team)  after 911 was called due to patient walking around naked in neighborhood.  Per BHRT patient called the response line three different times this morning.  They have gone out multiple times for assessment.  Reports the last call that came in was from [facility] after he called 911 when patient was walking around naked. BHRT reports they are very familiar with patient but she appears to be decompensated and does not appear to be at her baseline.  [name], 66 y.o., female patient seen face to face by this provider, consulted with Dr. Lucianne Muss; and chart reviewed on 08/04/22.  Per chart review patient has a past psychiatric history of schizoaffective disorder bipolar type.  She has had multiple inpatient psychiatric admissions including [facility].  She currently does not have any psychiatric services in place and has been prescribed Depakote and Seroquel.  Of note patient was seen at [facility] on 08/02/2022 and was given 5-day prescription for Depakote ER 1000 mg nightly.  Per caregiver Orvis Brill patient has not picked up prescription.  On evaluation [name] [name] is observed sitting in the assessment room in no apparent acute distress.   She is alert to self, day of the week, and city.  Her speech is tangential, loud and pressured. She is labile and jumped up out of seat and yelled in this writers face.She is not cooperative. She is disorganized in her thought process and has to be redirected.  She is grandiose and delusional, believes that she owns a church and is  looking to buy another, believes she is elevated higher than a Education officer, environmental. She is extremely hyper religious. She proposition's men to teach them how to be "Lavella Lemons". She jumped out of her seat and moved her arm back-and-forth in a sawing motion and then stated, "this is how I am going to circumcise them".  She is paranoid and reports that she called 911 today because someone was taunting her.  She endorses auditory hallucinations of hearing angels.  States she talks to them more than a talk to her and they love it when she performs for them.  She appears to be responding to internal/external stimuli.  She is talking to someone who is not there.  She denies SI/HI/VH.  Reports she has not slept in days.  She is illogical and unable to answer questions appropriately. She has poor judgement and is lacking insight.   Patient escalates quickly.  After assessment patient walked into the hallway and stripped off all her clothes and locked herself in the bathroom.  Staff was able to assist patient in putting on scrubs.  She then became extremely agitated and was yelling and posturing at staff. She was administered Zyprexa 5 mg and Ativan 1 mg p.o.  Involuntary commitment petitioned.  Per chart review patient appears to have some psychotic baseline features.  However after thorough assessment and obtaining collateral from patient's caregiver Anabel Halon in addition to patient's presentation at [facility] 2 days ago.  It does appear that patient has decompensated and is not at her baseline.  She will be recommended for inpatient  psychiatric admission.   Collateral Roque Lias who reports he is patient's payee and legal guardian- [phone].  Reports patient is not at her baseline.  States this morning alone she called police 7 times.  She took off her clothes and was walking around the neighborhood naked.  She has been threatening to hurt others and has been aggressive with different individuals.  States she refused to  leave [facility] because she believes she owned the facility.  Also states that patient has not slept in days.   Flowsheet Row ED from 08/04/2022 in [facility] ED from 08/02/2022 in [facility] ED from 07/24/2022 in [facility]  C-SSRS RISK CATEGORY No Risk No Risk No Risk       Psychiatric Specialty Exam  Presentation  General Appearance:Bizarre  Eye Contact:Fleeting  Speech:Pressured  Speech Volume:Increased  Handedness:Right   Mood and Affect  Mood: Anxious; Irritable; Labile; Angry  Affect: Labile   Thought Process  Thought Processes: Disorganized  Descriptions of Associations:Tangential  Orientation:Full (Time, Place and Person)  Thought Content:Delusions; Paranoid Ideation; Tangential  Diagnosis of Schizophrenia or Schizoaffective disorder in past: Yes  Duration of Psychotic Symptoms: Greater than six months  Hallucinations:Auditory hears angels  Ideas of Reference:Delusions; Paranoia  Suicidal Thoughts:No  Homicidal Thoughts:No   Sensorium  Memory: Recent Poor; Immediate Poor; Remote Poor  Judgment: Impaired  Insight: Lacking   Executive Functions  Concentration: Poor  Attention Span: Poor  Recall: Poor  Fund of Knowledge: Poor  Language: Poor   Psychomotor Activity  Psychomotor Activity: Restlessness; Increased   Assets  Assets: Physical Health; Resilience; Social Support   Sleep  Sleep: Fair  Number of hours:  0   Physical Exam: Physical Exam Vitals and nursing note reviewed.  Constitutional:      General: She is not in acute distress.    Appearance: Normal appearance. She is not ill-appearing.  HENT:     Head: Normocephalic.  Eyes:     General:        Right eye: No discharge.        Left eye: No discharge.  Cardiovascular:     Rate and Rhythm: Normal rate.  Pulmonary:     Effort: Pulmonary effort is normal.   Musculoskeletal:        General: Normal range of motion.     Cervical back: Normal range of motion.  Skin:    General: Skin is warm.     Coloration: Skin is not jaundiced or pale.  Neurological:     Mental Status: She is alert and oriented to person, place, and time.  Psychiatric:        Attention and Perception: She is inattentive. She perceives auditory hallucinations.        Mood and Affect: Mood is anxious. Affect is labile and angry.        Speech: Speech is rapid and pressured and tangential.        Behavior: Behavior is agitated.        Thought Content: Thought content is paranoid and delusional.        Cognition and Memory: Cognition normal.        Judgment: Judgment is impulsive.    Review of Systems  Constitutional: Negative.   HENT: Negative.    Eyes: Negative.   Respiratory: Negative.    Cardiovascular: Negative.   Musculoskeletal: Negative.   Skin: Negative.   Neurological: Negative.   Psychiatric/Behavioral:  Positive for hallucinations.  The patient is nervous/anxious and has insomnia.    Blood pressure (!) 128/95, pulse 100, resp. rate 20, SpO2 98%, unknown if currently breastfeeding. There is no height or weight on file to calculate BMI.  Musculoskeletal: Strength & Muscle Tone: within normal limits Gait & Station: normal Patient leans: N/A   [facility] MSE Discharge Disposition for Follow up and Recommendations: Based on my evaluation I certify that psychiatric inpatient services furnished can reasonably be expected to improve the patient's condition which I recommend transfer to an appropriate accepting facility.   Patient meets criteria for inpatient psychiatric admission.  IVC petitioned.  Patient is refusing lab work.  Patient will be sent to the [facility] emergency department while awaiting inpatient bed availability.Dr. Freida Busman is the accepting MD.   Recommendations for ED  Lab work CBC w diff, CMP, TSH, A1C, Lipid Panel, UDS,UA, Pregnancy, Valproic acid  level, ethanol EKG  Medications:  Continue- Depakote 1000 mg at bedtime for mood stabilization Start Zyprexa 5 mg BID- with QTC<480 for psychotic features and agitation.   Medications for Agitation  Zyprexa 5 mg p.o. or IM every 8 hours as needed for agitation And  Ativan 1 mg oral as needed, severe agitation, x 1 dose And  ziprasidone (Geodon) injection 20 mg IM as needed severe education x 1 dose     Ardis Hughs, NP 08/04/2022, 4:23 PM

## 2022-08-04 NOTE — Progress Notes (Signed)
LCSW Progress Note  [id number]   [name] [name]  08/04/2022  11:46 PM    Inpatient Behavioral Health Placement  Pt meets inpatient criteria per [facility]. There are no available beds within [facility] per [facility] Danika Riley,RN. Referral was sent to the following facilities;   Destination  Service Provider Address Phone Fax  [facility]  [address] [place], [place] [address] [phone]  [facility]  [address] [place] [address] [phone]  [facility] [address]  [address], [place] [address] [phone]  [facility] [address] [place]  [address], [place] [address] [phone]  [facility]  [address] [place] [address] [phone]  [facility]  [address] [place], [place] [address] [phone]  [facility]  [address] [place] [address] [phone]  [facility]  [address] [place] [address] [phone]  [facility]  [address] [place] [address] [phone]  [facility]  [address] [place] [address] [phone]  [facility]  [address] [place] [address] [phone]  [facility]  [address], [place] [address] [phone]  [facility]  [address], [place] [address] [phone]  [facility]  [address] [place] [place] [address] [phone]  [facility]  [address] [place] [address] [phone]  [facility]  [address], [place]  [place] [address] [phone]  [facility]  [address], [place] [address] [phone]  [facility]  [address] [place] [address] [phone]  [facility]  [address] [place] [address] [phone]  [facility]  [address] [place] [address] [phone]  [facility]  [phone] [address] [place] [address] [phone]  [facility]  [address], [place] [address] [phone]    Situation ongoing,  CSW will follow up.    [name] [name], MSW, [facility] 08/04/2022 11:46 PM

## 2022-08-04 NOTE — ED Notes (Signed)
This RN requesting that pt change into purple scrubs. Pt repeatedly stating that she is fully capable of caring for herself and will not go accompanied into the restroom. This RN agreeable, gave pt pants and shirt and requested that pt change out of personal clothes and remove her jewelry. Pt states that she is a married woman and cannot remove jewelry for religious reasons. Pt again states that no one should touch her. Pt in the bathroom to change by herself.Pt then opened the door partially undressed requesting a tampon. This RN explained to pt that we only have pads. Pt given mesh underwear, pads, and a brief as requested. Pt exited the restroom wearing her own pants and purple shirt as well as her shoes. Pt states that she will keep her shoes because they are prescription shoes and she cannot take them off for religious reasons. Pt apparently responding to internal stimulus. MD ordered medications for pt. Pt agreeable to take them however does not want this RN to touch her. This RN explained that for an IM shot touching is inevitable. Pt requesting that all medications and water be placed in a cup on the floor. This RN placed water and medicine cup on the floor. Pt took them independently. Pt initially requesting that IM shot be administered by MD. MD unavailable. Pt agreeable to receive medication from this RN. 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 Pt transferred to [facility]

## 2022-08-04 NOTE — Progress Notes (Signed)
   08/04/22 1445  [facility] Triage Screening (Walk-ins at [facility] only)  How Did You Hear About [place]? Legal System  What Is the Reason for Your Visit/Call Today? [name] [name] is a 42 year old female present to [facility] escorted by [employer] due to manic behavior. Per [employer] the pt was outside without clothing, pt denies these claims stating that she did have clothing on.Hx of schizoaffective disorder, bipolar type. Pt has tangential speech, stating that she told someone to call the police to bring her to [facility] because someone was taunting her. Pt denies SI/HI and AVH.  How Long Has This Been Causing You Problems? > than 6 months  Have You Recently Had Any Thoughts About Hurting Yourself? No  Are You Planning to Commit Suicide/Harm Yourself At This time? No  Have you Recently Had Thoughts About Hurting Someone [name] [name]? No  Are You Planning To Harm Someone At This Time? No  Are you currently experiencing any auditory, visual or other hallucinations? No  Have You Used Any Alcohol or Drugs in the Past 24 Hours? No  Do you have any current medical co-morbidities that require immediate attention? No  Clinician description of patient physical appearance/behavior: tangential speech, chronic manic behavior  What Do You Feel Would Help You the Most Today? Treatment for Depression or other mood problem  If access to [facility] was not available, would you have sought care in the Emergency Department? No  Determination of Need Routine (7 days)  Options For Referral Medication Management;Outpatient Therapy

## 2022-08-04 NOTE — ED Notes (Signed)
Service and transport called to take pt to [facility].

## 2022-08-04 NOTE — ED Triage Notes (Signed)
Pt arrives with [employer] under IVC from [facility]. Pt was IVC'd by provider at [facility] for aggression, paranoia, and delusions. Pt currently sleeping due to being medicated at [facility]. Pt will arouse to voice.

## 2022-08-04 NOTE — Discharge Instructions (Addendum)
Transfer patient to [facility] emergency department.  Dr. Freida Busman is the accepting MD

## 2022-08-05 DIAGNOSIS — F29 Unspecified psychosis not due to a substance or known physiological condition: Secondary | ICD-10-CM | POA: Diagnosis not present

## 2022-08-05 DIAGNOSIS — F22 Delusional disorders: Secondary | ICD-10-CM

## 2022-08-05 MED ORDER — ZIPRASIDONE MESYLATE 20 MG IM SOLR
20.0000 mg | INTRAMUSCULAR | Status: AC | PRN
  Administered 2022-08-05: 20 mg via INTRAMUSCULAR
  Filled 2022-08-05: qty 20

## 2022-08-05 MED ORDER — ZIPRASIDONE HCL 20 MG PO CAPS: 20.0000 mg | ORAL_CAPSULE | Freq: Two times a day (BID) | ORAL | Status: DC

## 2022-08-05 MED ORDER — LORAZEPAM 1 MG PO TABS: 1.0000 mg | ORAL_TABLET | ORAL | Status: DC | PRN

## 2022-08-05 MED ORDER — ZIPRASIDONE HCL 20 MG PO CAPS
20.0000 mg | ORAL_CAPSULE | Freq: Two times a day (BID) | ORAL | Status: DC
  Administered 2022-08-05 – 2022-08-06 (×2): 20 mg via ORAL
  Filled 2022-08-05 (×2): qty 1

## 2022-08-05 MED ORDER — OLANZAPINE 5 MG PO TBDP
5.0000 mg | ORAL_TABLET | Freq: Three times a day (TID) | ORAL | Status: DC | PRN
  Administered 2022-08-05: 5 mg via ORAL
  Filled 2022-08-05: qty 1

## 2022-08-05 MED ORDER — LORAZEPAM 1 MG PO TABS: 1.0000 mg | ORAL_TABLET | Freq: Four times a day (QID) | ORAL | Status: DC | PRN

## 2022-08-05 MED ORDER — LORAZEPAM 2 MG/ML IJ SOLN
2.0000 mg | Freq: Once | INTRAMUSCULAR | Status: AC
  Administered 2022-08-05: 2 mg via INTRAMUSCULAR
  Filled 2022-08-05: qty 1

## 2022-08-05 MED ORDER — OLANZAPINE 5 MG PO TBDP: 5.0000 mg | ORAL_TABLET | Freq: Three times a day (TID) | ORAL | Status: DC | PRN

## 2022-08-05 NOTE — ED Notes (Signed)
Pt now asleep in bed at this time.  Pt allowed to continue resting.  Previously requested feminine products kept at nurse station until pt needs them.

## 2022-08-05 NOTE — ED Notes (Signed)
Pt aggressive at this, walking towards door. This nurse voiced to pt to return to room. Pt yelling, requesting to see the doctor. Pt yelling in this nurse's face, [employer] present with this nurse.

## 2022-08-05 NOTE — ED Notes (Signed)
Breakfast provided.

## 2022-08-05 NOTE — ED Notes (Signed)
Lunch provided.

## 2022-08-05 NOTE — ED Notes (Signed)
Pt attempting to leave unit by exiting door to triage. Pt brought back to unit via [employer], security and sitter. Voiced to pt to remain in and to not come out at this time. Text message sent to Dr Rosalia Hammers for prn med for agitation.
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
## 2022-08-05 NOTE — ED Notes (Signed)
Pt requesting to leave facility. 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 Voiced to pt that the doctor will be present to unit when available. Pt returned back to room.

## 2022-08-05 NOTE — ED Notes (Signed)
ED Provider at bedside. 

## 2022-08-05 NOTE — ED Notes (Signed)
Dinner provided to pt.

## 2022-08-05 NOTE — ED Notes (Signed)
Psy NP present at bedside.
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
## 2022-08-05 NOTE — ED Notes (Signed)
Pt exit unit without permission. Pt noted in hallway yelling for doctor. Security and staff present with pt. Pt escorted back to room. Voiced to pt to remain in room. Pt voices understanding. Security and sitter present in view of pt.

## 2022-08-05 NOTE — Progress Notes (Addendum)
Pt was accepted to [facility] 08/06/[facility]. Bed assignment: Main campus  Pt meets inpatient criteria per Eligha Bridegroom, NP  Attending Physician will be Loni Beckwith, MD  Report can be called to: [phone] (this is a pager, please leave call-back number when giving report)  Pt can arrive after 8 AM  Care Team Notified: Eligha Bridegroom, NP and Bobette Mo, RN  [place], LCSW  08/05/2022 1:59 PM

## 2022-08-05 NOTE — ED Notes (Signed)
Pt transferred to ED 52 and received for care at this time.  This RN providing pt care to another pt and will introduce self to pt shortly.

## 2022-08-05 NOTE — Progress Notes (Signed)
LCSW Progress Note  [id number]   Luda Vandeputte  08/05/2022  1:09 PM  Description:   Inpatient Psychiatric Referral  Patient was recommended inpatient per Eligha Bridegroom, NP. There are no available beds at [facility], per [facility] Rona Ravens, RN. Patient was referred to the following out of network facilities:   Destination  Service Provider Address Phone Fax  [facility]  [address] [place], [place] [address] [phone]  [facility]  [address] [place] [address] [phone]  [facility] [address]  [address], [place] [address] [phone]  [facility] [address] [place]  [address] [place] [address] [phone]  [facility]  [address] [place] [address] [phone]  [facility]  [address] [place], [place] [address] [phone]  [facility]  [address] [place] [address] [phone]  [facility]  [address] [place] [address] [phone]  [facility]  [address] [place] [address] [phone]  [facility]  [address] [place] [address] [phone]  [facility]  [address], [place] [address] [phone]  [facility]  [address] [place] [address] [phone]  [facility]  [address], [place] [address] [phone]  [facility]  [address] [place] [place] [address] [phone]  [facility]  [address] [place] [address] [phone]  [facility]  [address],  [place] [address] [phone]  [facility]  [address], [place] [address] [phone]  [facility]  [address] [place] [address] [phone]  [facility]  [address], [place] [address] [phone]  [facility]  [address] [place] [address] [phone]  [facility]  [phone] [place] [address] [phone]  [facility]  [address], [place] [address] [phone]  [facility]  [address] [place], [place] [address] [phone]  [facility]  [address] [place], [place] [address] [phone]  [facility]  [address] [place] [address] [phone]  [facility]  [address], [place] [address] [phone]  [facility]  [address] [place] [address] [phone]    Situation ongoing, CSW to continue following and update chart as more information becomes available.      Cathie Beams, LCSW  08/05/2022 1:09 PM

## 2022-08-05 NOTE — ED Notes (Signed)
Security present to room for removal of jewelry. Pt will not allow this nurse to remove jewelry.

## 2022-08-05 NOTE — ED Notes (Signed)
Pt accepted to [facility] tomorrow after 0800. Accepting Physician : Loni Beckwith. Call [phone] for report.

## 2022-08-05 NOTE — Progress Notes (Signed)
[facility] Psych ED Progress Note  08/05/2022 12:02 PM [name] [name]  MRN:  [id number]   Subjective:   Patient seen this morning at [facility], ED for face-to-face psychiatric evaluation.  Patient was seen yesterday at the [facility] behavioral health urgent care and recommended for inpatient psychiatric treatment.  According to her legal guardian and other psychiatric providers that are familiar with her care, her baseline is usually paranoid delusions present however no aggression.  In the past week patient has had increasing aggressive behaviors, and her legal guardian and providers familiar with her care felt as if she is not at baseline.  At the behavioral urgent care yesterday she had stripped nude, verbally aggressive to staff and threatening to fight staff, and uncooperative.  Patient had to be transferred to [facility], ED for holding.  Today patient does appear more calm and cooperative.  She continues to have delusions with tangential thought process.  She does confirm she lives with her legal guardian and helps in the church.  She does have grandiose delusions, however per chart review appears this is baseline.  She has been compliant with her medications while in the hospital.  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  She tells me that Geodon works really well for her and is requesting for this to be added to her medications.  She does mention feeling well this morning reporting good sleep last night.  She denies any suicidal or homicidal ideations.  She denies any auditory or visual hallucinations.  She does not appear to be responding to internal stimuli at this time during assessment.  Will add Geodon 20 mg BID [facility] for patient for additional stabilization. For now, will continue to recommend inpatient psychiatric treatment for medication management. However, pt will be assessed by psychiatry daily  to determine need for IP, as her paranoia and delusions are chronic in nature. Will continue to revaluate daily for potential return to baseline.   Principal Problem: Delusional disorder (HCC) Diagnosis:  Principal Problem:   Delusional disorder (HCC) Active Problems:   Non compliance w medication regimen   Schizoaffective disorder, bipolar type [facility])   ED Assessment Time Calculation: Start Time: 1100 Stop Time: 1130 Total Time in Minutes (Assessment Completion): 30   Past Psychiatric History:  Delusional disorder, schizoaffective dx, bipolar type  [place] Scale:  Flowsheet Row ED from 08/04/2022 in [facility] Department at [facility] Most recent reading at 08/04/2022  8:07 PM ED from 08/04/2022 in [facility] Most recent reading at 08/04/2022  3:22 PM ED from 08/02/2022 in [facility] Most recent reading at 08/02/2022 10:54 AM  C-SSRS RISK CATEGORY No Risk No Risk No Risk       Past Medical History:  Past Medical History:  Diagnosis Date   Bipolar affective disorder, currently manic, mild (HCC)    Diabetes mellitus without complication (HCC)    Schizophrenia (HCC)     Past Surgical History:  Procedure Laterality Date   CESAREAN SECTION  04/2018   WISDOM TOOTH EXTRACTION     Family History:  Family History  Problem Relation Age of Onset   Drug abuse Maternal Uncle    Family Psychiatric  History:   Social History:  Social History   Substance and Sexual Activity  Alcohol Use Not Currently     Social History   Substance and Sexual Activity  Drug  Use Not Currently    Social History   Socioeconomic History   Marital status: Legally Separated    Spouse name: Not on file   Number of children: Not on file   Years of education: Not on file   Highest education level: Not on file  Occupational History   Not on file  Tobacco Use   Smoking status: Never   Smokeless tobacco: Never  Vaping  Use   Vaping status: Never Used  Substance and Sexual Activity   Alcohol use: Not Currently   Drug use: Not Currently   Sexual activity: Not Currently  Other Topics Concern   Not on file  Social History Narrative   ** Merged History Encounter **       ** Merged History Encounter **       Social Determinants of Health   Financial Resource Strain: Not on file  Food Insecurity: Not on file  Transportation Needs: Not on file  Physical Activity: Not on file  Stress: Not on file  Social Connections: Not on file    Sleep: Fair  Appetite:  Fair  Current Medications: Current Facility-Administered Medications  Medication Dose Route Frequency Provider Last Rate Last Admin   benztropine (COGENTIN) tablet 1 mg  1 mg Oral BID Ernie Avena, MD   1 mg at 08/05/22 0748   divalproex (DEPAKOTE) DR tablet 1,000 mg  1,000 mg Oral QHS Ernie Avena, MD   1,000 mg at 08/04/22 2244   hydrOXYzine (ATARAX) tablet 25 mg  25 mg Oral TID PRN Ernie Avena, MD   25 mg at 08/05/22 0748   OLANZapine zydis (ZYPREXA) disintegrating tablet 5 mg  5 mg Oral Q8H PRN Eligha Bridegroom, NP       And   LORazepam (ATIVAN) tablet 1 mg  1 mg Oral Q6H PRN Eligha Bridegroom, NP       losartan (COZAAR) tablet 50 mg  50 mg Oral Daily Ernie Avena, MD   50 mg at 08/05/22 0748   metFORMIN (GLUCOPHAGE-XR) 24 hr tablet 500 mg  500 mg Oral BID WC Ernie Avena, MD   500 mg at 08/05/22 0748   metoprolol succinate (TOPROL-XL) 24 hr tablet 25 mg  25 mg Oral QHS Ernie Avena, MD   25 mg at 08/04/22 2245   ziprasidone (GEODON) capsule 20 mg  20 mg Oral BID WC Eligha Bridegroom, NP       Current Outpatient Medications  Medication Sig Dispense Refill   benztropine (COGENTIN) 1 MG tablet Take 1 tablet (1 mg total) by mouth 2 (two) times daily. 60 tablet 0   divalproex (DEPAKOTE ER) 500 MG 24 hr tablet Take 2 tablets (1,000 mg total) by mouth at bedtime for 10 days. 20 tablet 0   hydrOXYzine (ATARAX) 25 MG tablet Take 1  tablet (25 mg total) by mouth 3 (three) times daily as needed for anxiety. 30 tablet 0   losartan (COZAAR) 50 MG tablet Take 1 tablet (50 mg total) by mouth daily. 30 tablet 0   metFORMIN (GLUCOPHAGE-XR) 500 MG 24 hr tablet Take 1 tablet (500 mg total) by mouth 2 (two) times daily with a meal. 60 tablet 0   metoprolol succinate (TOPROL-XL) 25 MG 24 hr tablet Take 1 tablet (25 mg total) by mouth at bedtime. 30 tablet 0   paliperidone (INVEGA SUSTENNA) 234 MG/1.5ML injection Inject 234 mg into the muscle once for 1 dose. Administer on 12-05-2021 1.5 mL 0    Lab Results:  Results for orders placed  or performed during the hospital encounter of 08/04/22 (from the past 48 hour(s))  Comprehensive metabolic panel     Status: None   Collection Time: 08/04/22  8:08 PM  Result Value Ref Range   Sodium 139 135 - 145 mmol/L   Potassium 3.5 3.5 - 5.1 mmol/L   Chloride 101 98 - 111 mmol/L   CO2 25 22 - 32 mmol/L   Glucose, Bld 91 70 - 99 mg/dL    Comment: Glucose reference range applies only to samples taken after fasting for at least 8 hours.   BUN 11 6 - 20 mg/dL   Creatinine, Ser 1.61 0.44 - 1.00 mg/dL   Calcium 9.1 8.9 - 09.6 mg/dL   Total Protein 7.4 6.5 - 8.1 g/dL   Albumin 3.7 3.5 - 5.0 g/dL   AST 29 15 - 41 U/L   ALT 17 0 - 44 U/L   Alkaline Phosphatase 43 38 - 126 U/L   Total Bilirubin 1.1 0.3 - 1.2 mg/dL   GFR, Estimated >04 >54 mL/min    Comment: (NOTE) Calculated using the CKD-EPI Creatinine Equation (2021)    Anion gap 13 5 - 15    Comment: Performed at [facility], [address], [place], [place] [address]  Ethanol     Status: None   Collection Time: 08/04/22  8:08 PM  Result Value Ref Range   Alcohol, Ethyl (B) <10 <10 mg/dL    Comment: (NOTE) Lowest detectable limit for serum alcohol is 10 mg/dL.  For medical purposes only. Performed at [facility], [address] [place], [place] [address]   CBC with Diff     Status: Abnormal   Collection Time: 08/04/22   8:08 PM  Result Value Ref Range   WBC 6.3 4.0 - 10.5 K/uL   RBC 3.47 (L) 3.87 - 5.11 MIL/uL   Hemoglobin 10.6 (L) 12.0 - 15.0 g/dL   HCT 29.5 (L) 62.1 - 30.8 %   MCV 93.7 80.0 - 100.0 fL   MCH 30.5 26.0 - 34.0 pg   MCHC 32.6 30.0 - 36.0 g/dL   RDW 65.7 84.6 - 96.2 %   Platelets 191 150 - 400 K/uL   nRBC 0.0 0.0 - 0.2 %   Neutrophils Relative % 53 %   Neutro Abs 3.3 1.7 - 7.7 K/uL   Lymphocytes Relative 30 %   Lymphs Abs 1.9 0.7 - 4.0 K/uL   Monocytes Relative 16 %   Monocytes Absolute 1.0 0.1 - 1.0 K/uL   Eosinophils Relative 1 %   Eosinophils Absolute 0.0 0.0 - 0.5 K/uL   Basophils Relative 0 %   Basophils Absolute 0.0 0.0 - 0.1 K/uL   Immature Granulocytes 0 %   Abs Immature Granulocytes 0.01 0.00 - 0.07 K/uL    Comment: Performed at [facility], [address] [place], [place] [address]  hCG, serum, qualitative     Status: None   Collection Time: 08/04/22  8:08 PM  Result Value Ref Range   Preg, Serum NEGATIVE NEGATIVE    Comment:        THE SENSITIVITY OF THIS METHODOLOGY IS >10 mIU/mL. Performed at [facility], [address], [place], [place] [address]   Salicylate level     Status: Abnormal   Collection Time: 08/04/22  8:08 PM  Result Value Ref Range   Salicylate Lvl <7.0 (L) 7.0 - 30.0 mg/dL    Comment: Performed at [facility], [address] [place], [place] [address]  Acetaminophen level     Status: Abnormal   Collection Time: 08/04/22  8:08 PM  Result Value Ref Range   Acetaminophen (Tylenol), Serum <10 (L) 10 - 30 ug/mL    Comment: (NOTE) Therapeutic concentrations vary significantly. A range of 10-30 ug/mL  may be an effective concentration for many patients. However, some  are best treated at concentrations outside of this range. Acetaminophen concentrations >150 ug/mL at 4 hours after ingestion  and >50 ug/mL at 12 hours after ingestion are often associated with  toxic reactions.  Performed at [facility], [address] [place], [place] [address]   Valproic acid level     Status: Abnormal   Collection Time: 08/04/22  8:08 PM  Result Value Ref Range   Valproic Acid Lvl 34 (L) 50.0 - 100.0 ug/mL    Comment: Performed at [facility], [address], [place], [place] [address]  TSH     Status: None   Collection Time: 08/04/22  8:08 PM  Result Value Ref Range   TSH 0.489 0.350 - 4.500 uIU/mL    Comment: Performed by a 3rd Generation assay with a functional sensitivity of <=0.01 uIU/mL. Performed at [facility], [address] [place], [place] [address]   T4, free     Status: None   Collection Time: 08/04/22  8:08 PM  Result Value Ref Range   Free T4 1.07 0.61 - 1.12 ng/dL    Comment: (NOTE) Biotin ingestion may interfere with free T4 tests. If the results are inconsistent with the TSH level, previous test results, or the clinical presentation, then consider biotin interference. If needed, order repeat testing after stopping biotin. Performed at [facility], [address], [place], [place] [address]     Blood Alcohol level:  Lab Results  Component Value Date   [facility] <10 08/04/2022   ETH <10 07/24/2022    Psychiatric Specialty Exam:  Presentation  General Appearance:  Fairly Groomed  Eye Contact: Fair  Speech: Pressured  Speech Volume: Normal  Handedness: Right   Mood and Affect  Mood: Euthymic  Affect: Flat   Thought Process  Thought Processes: Disorganized  Descriptions of Associations:Tangential  Orientation:Full (Time, Place and Person)  Thought Content:Paranoid Ideation; Delusions; Tangential  History of Schizophrenia/Schizoaffective disorder:Yes  Duration of Psychotic Symptoms:Greater than six months  Hallucinations:Hallucinations: None Description of Auditory Hallucinations: hears angels  Ideas of Reference:Paranoia; Delusions  Suicidal Thoughts:Suicidal Thoughts: No  Homicidal Thoughts:Homicidal Thoughts: No   Sensorium   Memory: Immediate Fair; Recent Fair  Judgment: Impaired  Insight: Lacking   Executive Functions  Concentration: Fair  Attention Span: Fair  Recall: [employer] of Knowledge: Fair  Language: Fair   Psychomotor Activity  Psychomotor Activity: Psychomotor Activity: Restlessness   Assets  Assets: Physical Health; Resilience; Social Support   Sleep  Sleep: Sleep: Fair Number of Hours of Sleep: 0    Physical Exam: Physical Exam Neurological:     Mental Status: She is alert and oriented to person, place, and time.  Psychiatric:        Attention and Perception: She is inattentive.        Mood and Affect: Affect is flat.        Speech: Speech is rapid and pressured.        Behavior: Behavior is cooperative.        Thought Content: Thought content is paranoid and delusional.  Review of Systems  Psychiatric/Behavioral:         Paranoid, delusions  All other systems reviewed and are negative.  Blood pressure 110/79, pulse 89, temperature 98.9 F (37.2 C), temperature source Oral, resp. rate 18, SpO2 100%, unknown if currently breastfeeding. There is no height or weight on file to calculate BMI.   Medical Decision Making: Pt case reviewed and discussed with Dr. Lucianne Muss. Pt continues to meet criteria for IVC and inpatient psychiatric treatment.   It is important to consider that at baseline, patient is paranoid and delusional. However, usually no aggression at baseline. Psychiatry will continue to follow up daily to determine return to baseline and need for inpatient psychiatric treatment.   Problem 1: delusional disorder - Pt has been responding well to IM Geodon Prn medication. Will initiate Geodon 20 mg BID WIC po   Problem 2: Schizoaffective disorder, bipolar type - Continue Depakote 1,000 mg Qhs   Eligha Bridegroom, NP 08/05/2022, 12:02 PM

## 2022-08-06 DIAGNOSIS — F29 Unspecified psychosis not due to a substance or known physiological condition: Secondary | ICD-10-CM | POA: Diagnosis not present

## 2022-08-06 MED ORDER — LORAZEPAM 2 MG/ML IJ SOLN: 2.0000 mg | Freq: Four times a day (QID) | INTRAMUSCULAR | Status: DC | PRN

## 2022-08-06 MED ORDER — DIPHENHYDRAMINE HCL 50 MG/ML IJ SOLN: 50.0000 mg | Freq: Four times a day (QID) | INTRAMUSCULAR | Status: DC | PRN

## 2022-08-06 MED ORDER — DIPHENHYDRAMINE HCL 25 MG PO CAPS
50.0000 mg | ORAL_CAPSULE | Freq: Four times a day (QID) | ORAL | Status: DC | PRN
  Administered 2022-08-06: 50 mg via ORAL
  Filled 2022-08-06: qty 2

## 2022-08-06 MED ORDER — HALOPERIDOL 5 MG PO TABS
5.0000 mg | ORAL_TABLET | Freq: Four times a day (QID) | ORAL | Status: DC | PRN
  Administered 2022-08-06: 5 mg via ORAL
  Filled 2022-08-06: qty 1

## 2022-08-06 MED ORDER — LORAZEPAM 1 MG PO TABS
2.0000 mg | ORAL_TABLET | Freq: Four times a day (QID) | ORAL | Status: DC | PRN
  Administered 2022-08-06: 2 mg via ORAL
  Filled 2022-08-06: qty 2

## 2022-08-06 MED ORDER — HALOPERIDOL LACTATE 5 MG/ML IJ SOLN: 5.0000 mg | Freq: Four times a day (QID) | INTRAMUSCULAR | Status: DC | PRN

## 2022-08-06 NOTE — ED Notes (Signed)
Pt has been out of her room every 2-3 minutes with a request for the nurse. Pt has requested we test her for covid, HIV, breast cancer, UTI, and TB. 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 She states that she put herself under IVC and would like to change her status to voluntary. She informed nurse that she takes Seroquel, Depakote, and Geodon at night to help her sleep. She states that "I take Zyprexa in the morning and Geodon at night". Pt has been delusional for staff stating that her children were kidnapped from the hospital by a woman named Amy Harlow Ohms who has since changed her name so she cannot be found. She also reports being kidnapped and brought from [facility] against her will. She wants to speak with [place] legal aide to press charges against this individual.

## 2022-08-06 NOTE — ED Notes (Signed)
IVC copy placed in red folder in purple 08/06/22

## 2022-08-06 NOTE — ED Notes (Addendum)
Patient is noted to have her 3 rings and watch on at this time; Info will be passed to Day shift RN-Monique,RN

## 2022-08-06 NOTE — ED Notes (Signed)
Pt is very upset stating that the police stole her vibrator. States it was a very nice vibrator and that we should not have taken it.

## 2022-08-06 NOTE — ED Notes (Signed)
Pt repeatedly telling staff that she is legally blind, that she is deaf, and that ASL is her primary language. She keeps asking for 'Dr Emogene Morgan' and Dr. Ellwood Sayers. She says that Dr. Ellwood Sayers is her legal guardian, her Dr, her lawyer, and her boyfriend.

## 2022-09-18 ENCOUNTER — Emergency Department (HOSPITAL_COMMUNITY)
Admission: EM | Admit: 2022-09-18 | Discharge: 2022-09-19 | Disposition: A | Discharge status: Other Acute Inpatient Hospital | Payer: 59 | Source: Home / Self Care | Attending: Emergency Medicine | Admitting: Emergency Medicine

## 2022-09-18 ENCOUNTER — Encounter (HOSPITAL_COMMUNITY): Payer: Self-pay | Admitting: Emergency Medicine

## 2022-09-18 ENCOUNTER — Other Ambulatory Visit: Payer: Self-pay

## 2022-09-18 DIAGNOSIS — N898 Other specified noninflammatory disorders of vagina: Secondary | ICD-10-CM | POA: Insufficient documentation

## 2022-09-18 DIAGNOSIS — F250 Schizoaffective disorder, bipolar type: Secondary | ICD-10-CM | POA: Insufficient documentation

## 2022-09-18 DIAGNOSIS — F22 Delusional disorders: Secondary | ICD-10-CM | POA: Insufficient documentation

## 2022-09-18 DIAGNOSIS — F29 Unspecified psychosis not due to a substance or known physiological condition: Secondary | ICD-10-CM | POA: Insufficient documentation

## 2022-09-18 LAB — CBC
HCT: 35.9 % — ABNORMAL LOW (ref 36.0–46.0)
Hemoglobin: 12.1 g/dL (ref 12.0–15.0)
MCH: 30.8 pg (ref 26.0–34.0)
MCHC: 33.7 g/dL (ref 30.0–36.0)
MCV: 91.3 fL (ref 80.0–100.0)
Platelets: 181 10*3/uL (ref 150–400)
RBC: 3.93 MIL/uL (ref 3.87–5.11)
RDW: 13.5 % (ref 11.5–15.5)
WBC: 7 10*3/uL (ref 4.0–10.5)
nRBC: 0 % (ref 0.0–0.2)

## 2022-09-18 LAB — HCG, SERUM, QUALITATIVE: Preg, Serum: NEGATIVE

## 2022-09-18 MED ORDER — LOSARTAN POTASSIUM 25 MG PO TABS
50.0000 mg | ORAL_TABLET | Freq: Every day | ORAL | Status: DC
  Administered 2022-09-19: 50 mg via ORAL
  Filled 2022-09-18: qty 2

## 2022-09-18 MED ORDER — BENZTROPINE MESYLATE 0.5 MG PO TABS
1.0000 mg | ORAL_TABLET | Freq: Two times a day (BID) | ORAL | Status: DC
  Filled 2022-09-18: qty 2

## 2022-09-18 MED ORDER — DIVALPROEX SODIUM ER 500 MG PO TB24
1000.0000 mg | ORAL_TABLET | Freq: Every day | ORAL | Status: DC
  Administered 2022-09-19: 1000 mg via ORAL
  Filled 2022-09-18: qty 2

## 2022-09-18 MED ORDER — METFORMIN HCL ER 500 MG PO TB24
500.0000 mg | ORAL_TABLET | Freq: Two times a day (BID) | ORAL | Status: DC
  Administered 2022-09-19 (×2): 500 mg via ORAL
  Filled 2022-09-18 (×4): qty 1

## 2022-09-18 MED ORDER — METOPROLOL SUCCINATE ER 25 MG PO TB24
25.0000 mg | ORAL_TABLET | Freq: Every day | ORAL | Status: DC
  Administered 2022-09-19: 25 mg via ORAL
  Filled 2022-09-18: qty 1

## 2022-09-18 MED ORDER — HYDROXYZINE HCL 25 MG PO TABS
25.0000 mg | ORAL_TABLET | Freq: Three times a day (TID) | ORAL | Status: DC | PRN
  Administered 2022-09-19 (×2): 25 mg via ORAL
  Filled 2022-09-18 (×2): qty 1

## 2022-09-18 NOTE — ED Notes (Signed)
Labeled specimen container given to pt for U/A collection per MD order. [employer]

## 2022-09-18 NOTE — ED Notes (Signed)
Pt changed into hospital required purple behavior scrubs, all belongings placed in pt's belongings bag and label. NT to document items in pt's chart.   Security officer wanded pt for safety as per protocols.   Pt remains with her personal wig on head, this RN inspected wig and her wig cap for any potential harm items, no items seen, pt remains with her personal corrected glasses for seeing.   Pt has her personal Bible at bedside and study planner, this RN reviewed through the pages to make sure no items or harmful objects identify between pages.

## 2022-09-18 NOTE — ED Triage Notes (Signed)
Pt sts she is concerns for a possible foreign body in vagina. Sts about a week ago she stuck a tampon in and has been unable to take it out. Denies pain. Denies bleeding. Pt then begins to ramble on stating tampons are "evil." That she wants this taken out because she has been wanting to get pregnant. "God told her to get pregnant in a dream." Pt sts she feels unsafe as people have been stealing from her. At this time pt denies SI/HI.

## 2022-09-18 NOTE — ED Notes (Addendum)
Pt belongings located in nursing station near pt room 25: 1 Pt belonging bag 1 Brown Bag 1 [employer] 1 Phone w/ marks on screen

## 2022-09-18 NOTE — ED Provider Notes (Signed)
[name] [name] EMERGENCY DEPARTMENT AT [facility] Provider Note   CSN: [id number] Arrival date & time: 09/18/22  2217     History  Chief Complaint  Patient presents with   Foreign Body in Vagina   Psychiatric Evaluation    [name] [name] is a 42 y.o. female.  Initially presented stating that she thinks she may have a tampon in her vagina from 1 week ago. She is agitated, delusional and paranoid.  Patient rambling about evil spirits.  Patient has called 911 multiple times from the patient room for unknown reasons, necessitating police intervention. She is convinced that people are after her.  She seems focused on tampons being evil.  She is also very fixated on God telling her to get pregnant.       Home Medications Prior to Admission medications   Medication Sig Start Date End Date Taking? Authorizing Provider  benztropine (COGENTIN) 1 MG tablet Take 1 tablet (1 mg total) by mouth 2 (two) times daily. 11/08/21 12/08/21  Massengill, Harrold Donath, MD  divalproex (DEPAKOTE ER) 500 MG 24 hr tablet Take 2 tablets (1,000 mg total) by mouth at bedtime for 10 days. 08/02/22 08/12/22  Rankin, Shuvon B, NP  hydrOXYzine (ATARAX) 25 MG tablet Take 1 tablet (25 mg total) by mouth 3 (three) times daily as needed for anxiety. 11/21/21   Lamar Sprinkles, MD  losartan (COZAAR) 50 MG tablet Take 1 tablet (50 mg total) by mouth daily. 11/08/21 12/08/21  Massengill, Harrold Donath, MD  metFORMIN (GLUCOPHAGE-XR) 500 MG 24 hr tablet Take 1 tablet (500 mg total) by mouth 2 (two) times daily with a meal. 11/08/21 12/08/21  Massengill, Harrold Donath, MD  metoprolol succinate (TOPROL-XL) 25 MG 24 hr tablet Take 1 tablet (25 mg total) by mouth at bedtime. 11/08/21 12/08/21  Massengill, Harrold Donath, MD  paliperidone (INVEGA SUSTENNA) 234 MG/1.5ML injection Inject 234 mg into the muscle once for 1 dose. Administer on 12-05-2021 12/05/21 12/05/21  Phineas Inches, MD      Allergies    Neurontin [gabapentin], Trazodone and  nefazodone, Fluphenazine, and Haldol [haloperidol]    Review of Systems   Review of Systems  Physical Exam Updated Vital Signs BP (!) 125/104 (BP Location: Right Arm)   Pulse (!) 132   Temp 98.4 F (36.9 C) (Oral)   Resp 20   Ht 5\' 2"  (1.575 m)   Wt 70.8 kg   LMP 09/11/2022   SpO2 99%   BMI 28.53 kg/m  Physical Exam Vitals and nursing note reviewed. Exam conducted with a chaperone present.  Constitutional:      General: She is not in acute distress.    Appearance: She is well-developed.  HENT:     Head: Normocephalic and atraumatic.     Mouth/Throat:     Mouth: Mucous membranes are moist.  Eyes:     General: Vision grossly intact. Gaze aligned appropriately.     Extraocular Movements: Extraocular movements intact.     Conjunctiva/sclera: Conjunctivae normal.  Cardiovascular:     Rate and Rhythm: Normal rate and regular rhythm.     Pulses: Normal pulses.     Heart sounds: Normal heart sounds, S1 normal and S2 normal. No murmur heard.    No friction rub. No gallop.  Pulmonary:     Effort: Pulmonary effort is normal. No respiratory distress.     Breath sounds: Normal breath sounds.  Abdominal:     General: Bowel sounds are normal.     Palpations: Abdomen is soft.  Tenderness: There is no abdominal tenderness. There is no guarding or rebound.     Hernia: No hernia is present.  Genitourinary:    Vagina: Vaginal discharge (copious thick, chunky green) present.  Musculoskeletal:        General: No swelling.     Cervical back: Full passive range of motion without pain, normal range of motion and neck supple. No spinous process tenderness or muscular tenderness. Normal range of motion.     Right lower leg: No edema.     Left lower leg: No edema.  Skin:    General: Skin is warm and dry.     Capillary Refill: Capillary refill takes less than 2 seconds.     Findings: No ecchymosis, erythema, rash or wound.  Neurological:     General: No focal deficit present.      Mental Status: She is alert and oriented to person, place, and time.     GCS: GCS eye subscore is 4. GCS verbal subscore is 5. GCS motor subscore is 6.     Cranial Nerves: Cranial nerves 2-12 are intact.     Sensory: Sensation is intact.     Motor: Motor function is intact.     Coordination: Coordination is intact.  Psychiatric:        Attention and Perception: Attention normal.        Mood and Affect: Mood normal.        Speech: Speech is rapid and pressured.        Behavior: Behavior is hyperactive.        Thought Content: Thought content is paranoid and delusional.     ED Results / Procedures / Treatments   Labs (all labs ordered are listed, but only abnormal results are displayed) Labs Reviewed  CBC - Abnormal; Notable for the following components:      Result Value   HCT 35.9 (*)    All other components within normal limits  HCG, SERUM, QUALITATIVE  COMPREHENSIVE METABOLIC PANEL  ETHANOL  SALICYLATE LEVEL  ACETAMINOPHEN LEVEL  RAPID URINE DRUG SCREEN, HOSP PERFORMED    EKG None  Radiology No results found.  Procedures Procedures    Medications Ordered in ED Medications - No data to display  ED Course/ Medical Decision Making/ A&P                                 Medical Decision Making Amount and/or Complexity of Data Reviewed Labs: ordered.   Multiple emergency department with concerns over retained tampon.  No tampon noted on exam.  Patient exhibiting signs of psychosis.  She is delusional, paranoid, hyperreligious.  She will require psychiatric evaluation.        Final Clinical Impression(s) / ED Diagnoses Final diagnoses:  Psychosis, unspecified psychosis type [facility])    Rx / DC Orders ED Discharge Orders     None         Gilda Crease, MD 09/18/22 2351

## 2022-09-18 NOTE — ED Notes (Signed)
[employer] with pt as she was advised pt was making multiple phone calls to 911 from her personal cell phone, phone was taken away from pt at this time. LEO speaking with pt at this time, pt with rambling speech.

## 2022-09-19 ENCOUNTER — Inpatient Hospital Stay (HOSPITAL_COMMUNITY)
Admission: AD | Admit: 2022-09-19 | Discharge: 2022-09-29 | DRG: 885 | Disposition: A | Discharge status: Home / Self Care | Payer: 59 | Source: Intra-hospital | Attending: Psychiatry | Admitting: Psychiatry

## 2022-09-19 DIAGNOSIS — K5900 Constipation, unspecified: Secondary | ICD-10-CM | POA: Diagnosis present

## 2022-09-19 DIAGNOSIS — E119 Type 2 diabetes mellitus without complications: Secondary | ICD-10-CM | POA: Diagnosis present

## 2022-09-19 DIAGNOSIS — G4700 Insomnia, unspecified: Secondary | ICD-10-CM | POA: Diagnosis present

## 2022-09-19 DIAGNOSIS — Z635 Disruption of family by separation and divorce: Secondary | ICD-10-CM

## 2022-09-19 DIAGNOSIS — I10 Essential (primary) hypertension: Secondary | ICD-10-CM | POA: Diagnosis present

## 2022-09-19 DIAGNOSIS — Z7984 Long term (current) use of oral hypoglycemic drugs: Secondary | ICD-10-CM | POA: Diagnosis not present

## 2022-09-19 DIAGNOSIS — Z5982 Transportation insecurity: Secondary | ICD-10-CM | POA: Diagnosis not present

## 2022-09-19 DIAGNOSIS — Z79899 Other long term (current) drug therapy: Secondary | ICD-10-CM | POA: Diagnosis not present

## 2022-09-19 DIAGNOSIS — F250 Schizoaffective disorder, bipolar type: Secondary | ICD-10-CM | POA: Diagnosis present

## 2022-09-19 LAB — COMPREHENSIVE METABOLIC PANEL
ALT: 13 U/L (ref 0–44)
AST: 17 U/L (ref 15–41)
Albumin: 4.3 g/dL (ref 3.5–5.0)
Alkaline Phosphatase: 43 U/L (ref 38–126)
Anion gap: 12 (ref 5–15)
BUN: 10 mg/dL (ref 6–20)
CO2: 23 mmol/L (ref 22–32)
Calcium: 9.6 mg/dL (ref 8.9–10.3)
Chloride: 104 mmol/L (ref 98–111)
Creatinine, Ser: 0.8 mg/dL (ref 0.44–1.00)
GFR, Estimated: >60 mL/min (ref >60)
Glucose, Bld: 147 mg/dL — ABNORMAL HIGH (ref 70–99)
Potassium: 3.5 mmol/L (ref 3.5–5.1)
Sodium: 139 mmol/L (ref 135–145)
Total Bilirubin: 0.7 mg/dL (ref 0.3–1.2)
Total Protein: 8.7 g/dL — ABNORMAL HIGH (ref 6.5–8.1)

## 2022-09-19 LAB — URINALYSIS, ROUTINE W REFLEX MICROSCOPIC
Bacteria, UA: NONE SEEN
Bilirubin Urine: NEGATIVE
Glucose, UA: NEGATIVE mg/dL
Hgb urine dipstick: NEGATIVE
Ketones, ur: 5 mg/dL — AB
Nitrite: NEGATIVE
Protein, ur: NEGATIVE mg/dL
Specific Gravity, Urine: 1.01 (ref 1.005–1.030)
pH: 5 (ref 5.0–8.0)

## 2022-09-19 LAB — WET PREP, GENITAL
Clue Cells Wet Prep HPF POC: NONE SEEN
Sperm: NONE SEEN
Trich, Wet Prep: NONE SEEN
WBC, Wet Prep HPF POC: <10 (ref <10)
Yeast Wet Prep HPF POC: NONE SEEN

## 2022-09-19 LAB — GC/CHLAMYDIA PROBE AMP (~~LOC~~) NOT AT ARMC
Chlamydia: NEGATIVE
Comment: NEGATIVE
Comment: NORMAL
Neisseria Gonorrhea: NEGATIVE

## 2022-09-19 LAB — CBG MONITORING, ED
Glucose-Capillary: 107 mg/dL — ABNORMAL HIGH (ref 70–99)
Glucose-Capillary: 123 mg/dL — ABNORMAL HIGH (ref 70–99)
Glucose-Capillary: 98 mg/dL (ref 70–99)
Glucose-Capillary: 134 mg/dL — ABNORMAL HIGH (ref 70–99)

## 2022-09-19 LAB — ETHANOL: Alcohol, Ethyl (B): <10 mg/dL (ref <10)

## 2022-09-19 LAB — RAPID URINE DRUG SCREEN, HOSP PERFORMED
Amphetamines: NOT DETECTED
Barbiturates: NOT DETECTED
Benzodiazepines: NOT DETECTED
Cocaine: NOT DETECTED
Opiates: NOT DETECTED
Tetrahydrocannabinol: NOT DETECTED

## 2022-09-19 LAB — SALICYLATE LEVEL: Salicylate Lvl: <7 mg/dL — ABNORMAL LOW (ref 7.0–30.0)

## 2022-09-19 LAB — ACETAMINOPHEN LEVEL: Acetaminophen (Tylenol), Serum: <10 ug/mL — ABNORMAL LOW (ref 10–30)

## 2022-09-19 LAB — PREGNANCY, URINE: Preg Test, Ur: NEGATIVE

## 2022-09-19 LAB — HIV ANTIBODY (ROUTINE TESTING W REFLEX): HIV Screen 4th Generation wRfx: NONREACTIVE

## 2022-09-19 MED ORDER — ACETAMINOPHEN 325 MG PO TABS: 650.0000 mg | ORAL_TABLET | Freq: Four times a day (QID) | ORAL | Status: DC | PRN

## 2022-09-19 MED ORDER — ZIPRASIDONE MESYLATE 20 MG IM SOLR
20.0000 mg | Freq: Four times a day (QID) | INTRAMUSCULAR | Status: DC | PRN
  Administered 2022-09-19: 20 mg via INTRAMUSCULAR
  Filled 2022-09-19 (×2): qty 20

## 2022-09-19 MED ORDER — DIVALPROEX SODIUM ER 500 MG PO TB24
1000.0000 mg | ORAL_TABLET | Freq: Every day | ORAL | Status: DC
  Administered 2022-09-20: 1000 mg via ORAL
  Filled 2022-09-19 (×2): qty 2

## 2022-09-19 MED ORDER — LOSARTAN POTASSIUM 50 MG PO TABS
50.0000 mg | ORAL_TABLET | Freq: Every day | ORAL | Status: DC
  Administered 2022-09-23 – 2022-09-29 (×7): 50 mg via ORAL
  Filled 2022-09-19 (×13): qty 1

## 2022-09-19 MED ORDER — ALUM & MAG HYDROXIDE-SIMETH 200-200-20 MG/5ML PO SUSP
30.0000 mL | ORAL | Status: DC | PRN
  Administered 2022-09-23: 30 mL via ORAL
  Filled 2022-09-19: qty 30

## 2022-09-19 MED ORDER — OLANZAPINE 10 MG PO TBDP: 10.0000 mg | ORAL_TABLET | Freq: Three times a day (TID) | ORAL | Status: DC | PRN

## 2022-09-19 MED ORDER — QUETIAPINE FUMARATE ER 200 MG PO TB24
200.0000 mg | ORAL_TABLET | Freq: Every day | ORAL | Status: DC
  Filled 2022-09-19: qty 1

## 2022-09-19 MED ORDER — ZIPRASIDONE MESYLATE 20 MG IM SOLR: 20.0000 mg | INTRAMUSCULAR | Status: DC | PRN

## 2022-09-19 MED ORDER — LORAZEPAM 1 MG PO TABS
1.0000 mg | ORAL_TABLET | ORAL | Status: AC | PRN
  Administered 2022-09-20: 1 mg via ORAL
  Filled 2022-09-19 (×2): qty 1

## 2022-09-19 MED ORDER — HYDROXYZINE HCL 25 MG PO TABS
25.0000 mg | ORAL_TABLET | Freq: Three times a day (TID) | ORAL | Status: DC | PRN
  Administered 2022-09-19 – 2022-09-28 (×2): 25 mg via ORAL
  Filled 2022-09-19 (×4): qty 1

## 2022-09-19 MED ORDER — INSULIN ASPART 100 UNIT/ML IJ SOLN
0.0000 [IU] | Freq: Three times a day (TID) | INTRAMUSCULAR | Status: DC
  Administered 2022-09-19: 2 [IU] via SUBCUTANEOUS
  Filled 2022-09-19: qty 0.15

## 2022-09-19 MED ORDER — QUETIAPINE FUMARATE ER 200 MG PO TB24
200.0000 mg | ORAL_TABLET | Freq: Every day | ORAL | Status: DC
  Administered 2022-09-19: 200 mg via ORAL
  Filled 2022-09-19: qty 1

## 2022-09-19 MED ORDER — INSULIN ASPART 100 UNIT/ML IJ SOLN
0.0000 [IU] | Freq: Three times a day (TID) | INTRAMUSCULAR | Status: DC
  Administered 2022-09-23 – 2022-09-25 (×3): 2 [IU] via SUBCUTANEOUS
  Administered 2022-09-25: 3 [IU] via SUBCUTANEOUS
  Administered 2022-09-26 (×2): 2 [IU] via SUBCUTANEOUS
  Administered 2022-09-27: 3 [IU] via SUBCUTANEOUS
  Administered 2022-09-27 – 2022-09-28 (×2): 2 [IU] via SUBCUTANEOUS

## 2022-09-19 MED ORDER — METOPROLOL SUCCINATE ER 25 MG PO TB24
25.0000 mg | ORAL_TABLET | Freq: Every day | ORAL | Status: DC
  Administered 2022-09-21 – 2022-09-28 (×7): 25 mg via ORAL
  Filled 2022-09-19 (×13): qty 1

## 2022-09-19 MED ORDER — ZIPRASIDONE MESYLATE 20 MG IM SOLR
20.0000 mg | INTRAMUSCULAR | Status: AC | PRN
  Administered 2022-09-20: 20 mg via INTRAMUSCULAR
  Filled 2022-09-19: qty 20

## 2022-09-19 MED ORDER — LORAZEPAM 1 MG PO TABS: 1.0000 mg | ORAL_TABLET | ORAL | Status: DC | PRN

## 2022-09-19 MED ORDER — OLANZAPINE 10 MG PO TBDP
10.0000 mg | ORAL_TABLET | Freq: Three times a day (TID) | ORAL | Status: DC | PRN
  Administered 2022-09-19 – 2022-09-20 (×2): 10 mg via ORAL
  Filled 2022-09-19 (×2): qty 1

## 2022-09-19 MED ORDER — OLANZAPINE 10 MG PO TBDP
10.0000 mg | ORAL_TABLET | ORAL | Status: AC
  Administered 2022-09-19: 10 mg via ORAL
  Filled 2022-09-19: qty 1

## 2022-09-19 MED ORDER — METFORMIN HCL ER 500 MG PO TB24
500.0000 mg | ORAL_TABLET | Freq: Two times a day (BID) | ORAL | Status: DC
  Administered 2022-09-21 – 2022-09-29 (×14): 500 mg via ORAL
  Filled 2022-09-19 (×25): qty 1

## 2022-09-19 MED ORDER — MAGNESIUM HYDROXIDE 400 MG/5ML PO SUSP: 30.0000 mL | Freq: Every day | ORAL | Status: DC | PRN

## 2022-09-19 NOTE — ED Notes (Addendum)
TTS starting at this time with Beatriz Stallion, LCAS

## 2022-09-19 NOTE — ED Notes (Addendum)
Per Beatriz Stallion, LCAS "NP [name] is recommending inpatient psychiatric care for this patient.". Dr. [name] made aware.

## 2022-09-19 NOTE — Progress Notes (Signed)
Pt has been accepted to [facility] TODAY 09/19/2022. Bed assignment: 502-1  Pt meets inpatient criteria per Dahlia Byes, NP  Attending Physician will be Phineas Inches, MD  Report can be called to: - Adult unit: [phone]  Pt can arrive after 8 PM  Care Team Notified: [facility] [place], RN, Dahlia Byes, NP, and Pearlie Oyster, Paramedic  [place], [place]  09/19/2022 2:44 PM

## 2022-09-19 NOTE — ED Notes (Signed)
Patient was picked up by [employer]. IVC paperwork was given to officer, along with her belongings which included a brown purse with a tassel on it, [employer] backpack purse, and a hospital bag with stuff in it.  Patient was escorted out.

## 2022-09-19 NOTE — ED Notes (Signed)
Pt come out and state "ma'am I am legally blind and walk with a cane, please let the provider know of this". Pt has been ambulating from room to BR multiple times w/o any assistance, pt pacing around her room with steady gait. This RN and other medical staff have not noted any gait mobility issues. Pt does not appear to be legally blind as well, pt does have a pair of glasses with her, but are on bedside table have not worn as much. Pt has not ran into any objects, have not seen pt tripping over any items, pt has been reading her Bible w/o difficulty, and have seen her writing as well w/o difficulty of being able to see.

## 2022-09-19 NOTE — ED Notes (Signed)
Pt. Found behind nurses station, answering the phone. Pt. Brought back to bed.
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
## 2022-09-19 NOTE — Progress Notes (Incomplete)
Admission Note:   42 yr old female presents IVC. Pt was uncooperative with admission process. Pt presented with pressured, disorganized speech.         Pt has Past medical Hx of Asthma, Depression and DM without complication .         Skin was assessed and found to be clear of any abnormal marks apart from a scar on L-temple area. PT searched and no contraband found, POC and unit policies explained and understanding verbalized. Consents obtained. Food and fluids offered, and fluids accepted. Pt had no additional questions or concerns.

## 2022-09-19 NOTE — ED Notes (Signed)
TTS completed at this time, waiting on disposition/POC at this time from Beatriz Stallion, LCAS.
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
## 2022-09-19 NOTE — ED Notes (Signed)
Pt given OJ, slice of cheese, apple sauce, and [place] sandwich

## 2022-09-19 NOTE — ED Provider Notes (Signed)
Emergency Medicine Observation Re-evaluation Note  [name] [name] is a 42 y.o. female, seen on rounds today.  Pt initially presented to the ED for complaints of Psychiatric Evaluation Currently, the patient is awake and alert.  Pt presents with psychosis.  She has been eval by psych who recommends inpatient admission.  Per nursing, she did not sleep much last night.  Physical Exam  BP (!) 124/93   Pulse (!) 112   Temp 98.4 F (36.9 C) (Oral)   Resp 16   Ht 5\' 2"  (1.575 m)   Wt 70.8 kg   LMP 09/11/2022   SpO2 99%   BMI 28.53 kg/m  Physical Exam General: awake and alert Cardiac: rr Lungs: clear Psych: psychosis  ED Course / MDM  EKG:   I have reviewed the labs performed to date as well as medications administered while in observation.  Recent changes in the last 24 hours include tts eval.  Plan  Current plan is for inpatient psych adm.    Jacalyn Lefevre, MD 09/19/22 [phone]

## 2022-09-19 NOTE — ED Notes (Signed)
Pt ambulated to restroom accompanied by NT

## 2022-09-19 NOTE — ED Notes (Signed)
[employer] has been called for transport  ?

## 2022-09-19 NOTE — ED Notes (Signed)
Pt getting verbally uncooperative, pt removed her purple scrubs, through them in the trash and placed herself into a hospital gown, pt refuses to wear scrubs, stating "it is against my religion, women don't wear pants". Pt with pursued speech, pt reporting "I am hear d/t court order to get my children back, I am not a mental patient". This RN and RN Durene Cal attempting to redirect pt, pt is hard to redirect, but pt was complaint and were able to place another set or purple scrubs. Pt is expressing her needs that she needs to leave now that her "psych" evaluation is completed. Pt is unable to be redirected, pt is appearing to be in sort of manic/delusional state, pt is unable to comprehend the manor of her visit and POC.  Dr. [name] notified, medication order, refer to [facility]

## 2022-09-19 NOTE — ED Notes (Signed)
Patient given graham crackers and peanut butter for snacks

## 2022-09-19 NOTE — ED Notes (Signed)
Patient came up to the window wanting to call her attorney. I let her use the phone.Didn't go though. Patient then went on about being engaged to tyler perry and going to his mansion. He was going to buy her clothes and let her live there. She then said she didn't want

## 2022-09-20 ENCOUNTER — Encounter (HOSPITAL_COMMUNITY): Payer: Self-pay | Admitting: Nurse Practitioner

## 2022-09-20 DIAGNOSIS — F250 Schizoaffective disorder, bipolar type: Secondary | ICD-10-CM | POA: Diagnosis not present

## 2022-09-20 LAB — HEMOGLOBIN A1C
Hgb A1c MFr Bld: 6.9 % — ABNORMAL HIGH (ref 4.8–5.6)
Mean Plasma Glucose: 151 mg/dL

## 2022-09-20 LAB — GLUCOSE, CAPILLARY: Glucose-Capillary: 123 mg/dL — ABNORMAL HIGH (ref 70–99)

## 2022-09-20 MED ORDER — DIPHENHYDRAMINE HCL 25 MG PO CAPS: 50.0000 mg | ORAL_CAPSULE | Freq: Three times a day (TID) | ORAL | Status: DC | PRN

## 2022-09-20 MED ORDER — LORAZEPAM 2 MG/ML IJ SOLN: 2.0000 mg | Freq: Three times a day (TID) | INTRAMUSCULAR | Status: DC | PRN

## 2022-09-20 MED ORDER — LORAZEPAM 2 MG/ML IJ SOLN
INTRAMUSCULAR | Status: AC
  Filled 2022-09-20: qty 1

## 2022-09-20 MED ORDER — HALOPERIDOL LACTATE 5 MG/ML IJ SOLN: 5.0000 mg | Freq: Three times a day (TID) | INTRAMUSCULAR | Status: DC | PRN

## 2022-09-20 MED ORDER — LORAZEPAM 1 MG PO TABS: 2.0000 mg | ORAL_TABLET | Freq: Three times a day (TID) | ORAL | Status: DC | PRN

## 2022-09-20 MED ORDER — DIPHENHYDRAMINE HCL 50 MG/ML IJ SOLN: 50.0000 mg | Freq: Three times a day (TID) | INTRAMUSCULAR | Status: DC | PRN

## 2022-09-20 MED ORDER — HALOPERIDOL 5 MG PO TABS: 5.0000 mg | ORAL_TABLET | Freq: Three times a day (TID) | ORAL | Status: DC | PRN

## 2022-09-20 MED ORDER — LORAZEPAM 2 MG/ML IJ SOLN: 1.0000 mg | Freq: Once | INTRAMUSCULAR | Status: DC

## 2022-09-20 MED ORDER — TEMAZEPAM 15 MG PO CAPS
15.0000 mg | ORAL_CAPSULE | Freq: Every evening | ORAL | Status: DC | PRN
  Administered 2022-09-21 – 2022-09-28 (×9): 15 mg via ORAL
  Filled 2022-09-20 (×12): qty 1

## 2022-09-20 MED ORDER — QUETIAPINE FUMARATE ER 400 MG PO TB24
400.0000 mg | ORAL_TABLET | Freq: Every day | ORAL | Status: DC
  Administered 2022-09-21 – 2022-09-23 (×3): 400 mg via ORAL
  Filled 2022-09-20 (×5): qty 1

## 2022-09-20 MED ORDER — LORAZEPAM 1 MG PO TABS: 1.0000 mg | ORAL_TABLET | Freq: Once | ORAL | Status: DC

## 2022-09-20 MED ORDER — DIVALPROEX SODIUM ER 500 MG PO TB24
1000.0000 mg | ORAL_TABLET | Freq: Every day | ORAL | Status: DC
  Administered 2022-09-21 – 2022-09-23 (×3): 1000 mg via ORAL
  Filled 2022-09-20 (×5): qty 2

## 2022-09-20 NOTE — Plan of Care (Addendum)
Assessed patient in the seclusion room after nursing noted that they walked her into the room. Per nursing, patient was noted to be pacing the unit with raised voice. She was given agitation protocol (geodon, zyprexa, ativan) but continued to be agitated. She was walked into the seclusion room. This provider went to assess patient. Patient noted to continue to be agitated, shouting at this provider and the nurses from the seclusion room window. Attempted to de-escalate patient however patient banging on the window. Order placed for an additional ativan 1mg .   Agitation protocol changed from geodon, zyprexa to haldol, benadryl, and ativan.
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
## 2022-09-20 NOTE — Progress Notes (Signed)
Upon arrival to unit, pt was disruptive on unit. Continuously yelling in the shower and demanding to be released. Pt visibly agitated. Given part of agitation protocol, pt refused to take the PRN Ativan.

## 2022-09-20 NOTE — Progress Notes (Signed)
The patient stood in her doorway approximately thirty minutes ago and argued over and over again as to why she should be discharged today. 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 She states that she has been committed to the hospital illegally. Her loud talking resulted in her peer waking up in the adjacent bedroom. She is currently taking a shower and is making growling sounds.

## 2022-09-20 NOTE — Progress Notes (Signed)
Patient is demanding that she be discharged tomorrow and is wide awake.

## 2022-09-20 NOTE — Progress Notes (Signed)
Pt remains disorganized and delusion, pts is calm at this time. Nurse walked with pt to her room. Pt laying in bed at this time. No distress noted.

## 2022-09-20 NOTE — H&P (Signed)
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 Pulmonary effort is normal.  Neurological:     General: No focal deficit present.     Mental Status: the patient is alert and oriented to person, place, and time.   Review of Systems  Respiratory:  Negative for shortness of breath.   Cardiovascular:  Negative for chest pain.  Gastrointestinal:  Negative for abdominal pain, constipation, diarrhea, nausea and vomiting.  Neurological:  Negative  for headaches.   Blood pressure (!) 122/95, pulse (!) 140, temperature (!) 97.4 F (36.3 C), temperature source Oral, resp. rate 20, height 5\' 2"  (1.575 m), weight 88 kg, last menstrual period 09/11/2022, SpO2 100%, unknown  if currently breastfeeding. Body mass index is 35.48 kg/m.  Treatment Plan Summary: Daily contact with patient to assess and evaluate symptoms and progress in treatment, Medication management, and Plan    ASSESSMENT: [name] [name] is a 42 y.o. female  with a past psychiatric history of schizoaffective disorder bipolar type. Patient initially arrived to [facility] on 09/18/22 for foreign body in vagina (none found), agitation, delusions, and paranoia, and admitted to [facility] under IVC on 09/20/22 for crisis stabalization. PPHx is significant for schizoaffective disorder bipolar type, and no history of Suicide Attempts, Self Injurious Behavior. Patient has had 12 ED visits over the past year as well as multiple prior Psychiatric Hospitalizations (most recent [facility] 07/2021). PMHx is significant for diabetes, HTN.   Diagnoses / Active Problems: Schizoaffective disorder bipolar type, current episode manic   PLAN: Safety and Monitoring:  --  INVOLUNTARY  admission to inpatient psychiatric unit for safety, stabilization and treatment  -- Daily contact with patient to assess and evaluate symptoms and progress in treatment  -- Patient's case to be discussed in multi-disciplinary team meeting  -- Observation Level : q15 minute checks  -- Vital signs:  q12 hours  -- Precautions: suicide, elopement, and assault  2. Psychiatric Diagnoses and Treatment:   -- Start depakote ER 1000mg  at bedtime   -- Start seroquel 400mg  at bedtime   -- Start restoril 15mg  at bedtime and repeat x1 PRN  -- Change agitation protocol from geodon, zyprexa to haldol 5, ativan 2, benadryl 50  -- The risks/benefits/side-effects/alternatives to this medication were discussed in detail with the patient and  time was given for questions. The patient consents to medication trial.              -- Metabolic profile and EKG monitoring obtained while on an atypical antipsychotic  BMI: 35.48 TSH:  Lab Results  Component Value Date   TSH 0.489 08/04/2022   Lipid Panel: LDL pending HbgA1c: 6.9 QTc: >510             -- Encouraged patient to participate in unit milieu and in scheduled group therapies   -- Short Term Goals: Ability to identify changes in lifestyle to reduce recurrence of condition will improve, Ability to verbalize feelings will improve, Ability to demonstrate self-control will improve, Ability to identify and develop effective coping behaviors will improve, Ability to maintain clinical measurements within normal limits will improve, and Compliance with prescribed medications will improve  -- Long Term Goals: Improvement in symptoms so as ready for discharge  Other PRNS:               -- start acetaminophen 650 mg every 6 hours as needed for mild to moderate pain, fever, and headaches              -- start hydroxyzine 25 mg three times a day as needed for anxiety              -- start aluminum-magnesium hydroxide + simethicone 30 mL every 4 hours as needed for heartburn or indigestion  -- As needed agitation protocol in-place  3. Medical Issues Being Addressed:   #DM  Continue home metformin 500 BID   #HTN - Continue home metoprolol XL 25mg  at bedtime   #Prolonged Qtc -repeat EKG for tomorrow AM  4. Discharge Planning:   -- Social work and case management to assist with discharge planning and identification of hospital follow-up needs prior to discharge  -- Estimated LOS: 5-7 days  -- Discharge  Concerns: Need to establish a safety plan; Medication compliance and effectiveness  -- Discharge Goals: Return home with outpatient referrals for mental health follow-up including medication management/psychotherapy   I certify that inpatient services furnished can reasonably be  expected to improve the patient's condition.   This note was created using a voice recognition software as a result there may be grammatical errors inadvertently enclosed that do not reflect the nature of this encounter. Every attempt is made to correct such errors.   Signed: Dr. Karie Fetch, MD PGY-2, Psychiatry Residency  9/14/20242:46 PM

## 2022-09-20 NOTE — Progress Notes (Signed)
Pt is admitted to [facility]. This CSW will now remove pt from the [facility] shift report.   Maryjean Ka, MSW, [facility] 09/20/2022 2:53 AM
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
## 2022-09-20 NOTE — Tx Team (Signed)
Initial Treatment Plan 09/20/2022 [name] [name] NWG:[id number]    PATIENT STRESSORS: Occupational concerns Medication noncompliance    PATIENT STRENGTHS: Communication skills  General fund of knowledge  Religious Affiliation    PATIENT IDENTIFIED PROBLEMS: Psychosis                     DISCHARGE CRITERIA:  Adequate post-discharge living arrangements Improved stabilization in mood, thinking, and/or behavior Motivation to continue treatment in a less acute level of care Need for constant or close observation no longer present Reduction of life-threatening or endangering symptoms to within safe limits Verbal commitment to aftercare and medication compliance  PRELIMINARY DISCHARGE PLAN: Outpatient therapy Return to previous living arrangement  PATIENT/FAMILY INVOLVEMENT: This treatment plan has been presented to and reviewed with the patient, [name] [name].  The patient has been given the opportunity to ask questions and make suggestions.  Daneil Dan, RN 09/19/2022

## 2022-09-20 NOTE — Plan of Care (Signed)
Problem: Education: Goal: Knowledge of [place] [facility] Education information/materials will improve Outcome: Not Progressing Goal: Emotional status will improve Outcome: Not Progressing Goal: Mental status will improve Outcome: Not Progressing Goal: Verbalization of understanding the information provided will improve Outcome: Not Progressing   Problem: Activity: Goal: Interest or engagement in activities will improve Outcome: Not Progressing Goal: Sleeping patterns will improve Outcome: Not Progressing   Problem: Coping: Goal: Ability to verbalize frustrations and anger appropriately will improve Outcome: Not Progressing Goal: Ability to demonstrate self-control will improve Outcome: Not Progressing

## 2022-09-20 NOTE — Progress Notes (Signed)
Pt pacing unit, boisterous, delusional disorganized and agitated. 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 Pt also states she is ms [place]. Pt hyper religious and hyper sexual telling staff about her husband and his penis size and his extramarrital affairs. Pt often screaming in tongues and blessing and cursing staff. Pt received medication for agitation without relief. Pt banging on windows. Verbal deescalation unsuccessful. Pt walked with staff into seclusion room at 1247. Pt continued to bang on door and scream. Provider in to see pt and attempt to deescalate as well. Pt received another dose of medication. Pt sitting on floor in seclusion room eating lunch at this time. Staff with pt for safety. Seclusion door opened at 118pm. Pt sitting calmly at this time. Staff remains with pt.

## 2022-09-20 NOTE — Progress Notes (Addendum)
Pt stated "I died last night because I took your medication. I am allergic to the medication you gave me. I am reborn because of God, God brought me back. I need to figure out a way to return my ashes." Pt proceeded to kneel on the floor. Requesting for staff to repent upon their actions. Pt yelling and verbally aggressive. Needed to be redirected constantly to bedroom.

## 2022-09-20 NOTE — Progress Notes (Signed)
Patient only slept 1.75 hours last night. Came into the dayroom this morning half-dressed. Patient had to be constantly redirected to leave out the dayroom. Patient was given a gown to cover herself. Patient refusing glucose monitoring check. Stating she is not diabetic. Patient yelling at staff and other patients while getting her vital signs taken.

## 2022-09-20 NOTE — Group Note (Signed)
Date:  09/20/2022 Time:  8:56 PM  Group Topic/Focus:  Wrap-Up Group:   The focus of this group is to help patients review their daily goal of treatment and discuss progress on daily workbooks.    Participation Level:  Did Not Attend   Scot Dock 09/20/2022, 8:56 PM

## 2022-09-21 DIAGNOSIS — F250 Schizoaffective disorder, bipolar type: Secondary | ICD-10-CM | POA: Diagnosis not present

## 2022-09-21 LAB — GLUCOSE, CAPILLARY
Glucose-Capillary: 124 mg/dL — ABNORMAL HIGH (ref 70–99)
Glucose-Capillary: 152 mg/dL — ABNORMAL HIGH (ref 70–99)

## 2022-09-21 MED ORDER — OLANZAPINE 10 MG IM SOLR: 10.0000 mg | Freq: Three times a day (TID) | INTRAMUSCULAR | Status: DC | PRN

## 2022-09-21 MED ORDER — OLANZAPINE 10 MG PO TBDP: 10.0000 mg | ORAL_TABLET | Freq: Three times a day (TID) | ORAL | Status: DC | PRN

## 2022-09-21 MED ORDER — QUETIAPINE FUMARATE 100 MG PO TABS
100.0000 mg | ORAL_TABLET | Freq: Two times a day (BID) | ORAL | Status: DC
  Administered 2022-09-21 – 2022-09-24 (×3): 100 mg via ORAL
  Filled 2022-09-21 (×10): qty 1

## 2022-09-21 MED ORDER — MELATONIN 3 MG PO TABS: 3.0000 mg | ORAL_TABLET | Freq: Every evening | ORAL | Status: DC | PRN

## 2022-09-21 MED ORDER — WHITE PETROLATUM EX OINT
TOPICAL_OINTMENT | CUTANEOUS | Status: AC
  Administered 2022-09-21: 1
  Filled 2022-09-21: qty 5

## 2022-09-21 NOTE — Group Note (Signed)
Date:  09/21/2022 Time:  9:15 PM  Group Topic/Focus:  Wrap-Up Group:   The focus of this group is to help patients review their daily goal of treatment and discuss progress on daily workbooks.    Participation Level:  Did Not Attend   Scot Dock 09/21/2022, 9:15 PM

## 2022-09-21 NOTE — Progress Notes (Signed)
   09/20/22 2000  Psych Admission Type (Psych Patients Only)  Admission Status Involuntary  Psychosocial Assessment  Patient Complaints Irritability  Eye Contact Fair  Facial Expression Angry  Affect Labile  Speech Pressured  Interaction Minimal  Motor Activity Fidgety  Appearance/Hygiene Bizarre  Behavior Characteristics Irritable  Mood Labile  Thought Process  Coherency Disorganized;Flight of ideas  Content Delusions;Preoccupation;Religiosity;Paranoia  Delusions Religious;Paranoid;Grandeur  Perception Illusions  Hallucination None reported or observed  Judgment Poor  Confusion None  Danger to Self  Current suicidal ideation? Denies  Danger to Others  Danger to Others None reported or observed  Danger to Others Abnormal  Harmful Behavior to others No threats or harm toward other people  Destructive Behavior No threats or harm toward property   Pt slept the majority of the shift.

## 2022-09-21 NOTE — Progress Notes (Signed)
   09/21/22 0600  15 Minute Checks  Location Bedroom  Visual Appearance Calm  Behavior Sleeping  Sleep (Behavioral Health Patients Only)  Calculate sleep? (Click Yes once per 24 hr at 0600 safety check) Yes  Documented sleep last 24 hours 10.5
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
## 2022-09-21 NOTE — Plan of Care (Signed)
Problem: Education: Goal: Emotional status will improve Outcome: Not Progressing Goal: Mental status will improve Outcome: Not Progressing   Problem: Coping: Goal: Ability to verbalize frustrations and anger appropriately will improve Outcome: Not Progressing

## 2022-09-21 NOTE — Progress Notes (Signed)
   09/21/22 2200  Psych Admission Type (Psych Patients Only)  Admission Status Involuntary  Psychosocial Assessment  Patient Complaints Anxiety;Irritability  Eye Contact Fair  Facial Expression Angry  Affect Labile  Speech Argumentative;Pressured  Interaction Demanding;Hostile;Needy  Motor Activity Restless  Appearance/Hygiene In scrubs  Behavior Characteristics Irritable;Restless  Mood Labile  Thought Process  Coherency Disorganized;Flight of ideas  Content Delusions;Preoccupation;Religiosity;Paranoia  Delusions Religious;Paranoid;Grandeur  Perception Illusions  Hallucination None reported or observed  Judgment Poor  Confusion None  Danger to Self  Current suicidal ideation? Denies  Agreement Not to Harm Self Yes  Description of Agreement verbal  Danger to Others  Danger to Others None reported or observed  Danger to Others Abnormal  Harmful Behavior to others No threats or harm toward other people  Destructive Behavior No threats or harm toward property

## 2022-09-21 NOTE — Progress Notes (Signed)
   09/21/22 0815  Psych Admission Type (Psych Patients Only)  Admission Status Involuntary  Psychosocial Assessment  Patient Complaints Anxiety;Irritability  Eye Contact Fair  Facial Expression Angry  Affect Labile  Speech Argumentative;Pressured  Interaction Demanding;Hostile;Needy  Motor Activity Restless  Appearance/Hygiene In scrubs  Behavior Characteristics Irritable;Restless  Mood Labile  Thought Process  Coherency Disorganized;Flight of ideas  Content Delusions;Preoccupation;Religiosity;Paranoia  Delusions Religious;Paranoid;Grandeur  Perception Illusions  Hallucination None reported or observed  Judgment Poor  Confusion None  Danger to Self  Current suicidal ideation? Denies  Agreement Not to Harm Self Yes  Description of Agreement Verbal  Danger to Others  Danger to Others None reported or observed  Danger to Others Abnormal  Harmful Behavior to others No threats or harm toward other people  Destructive Behavior No threats or harm toward property

## 2022-09-21 NOTE — Progress Notes (Signed)
EKG complete and in chart

## 2022-09-22 ENCOUNTER — Encounter (HOSPITAL_COMMUNITY): Payer: Self-pay

## 2022-09-22 DIAGNOSIS — F250 Schizoaffective disorder, bipolar type: Secondary | ICD-10-CM | POA: Diagnosis not present

## 2022-09-22 MED ORDER — PALIPERIDONE PALMITATE ER 234 MG/1.5ML IM SUSY
234.0000 mg | PREFILLED_SYRINGE | Freq: Once | INTRAMUSCULAR | Status: AC
  Administered 2022-09-22: 234 mg via INTRAMUSCULAR
  Filled 2022-09-22: qty 1.5

## 2022-09-22 NOTE — Progress Notes (Signed)
DAR NOTE: Patient presents with irritable mood and affect.  Appears guarded and suspicious of staff.  Stated, "staff are our to get her."  Refused all her medications including CBG.  Denies pain, auditory and visual hallucinations.  Rates depression at 0, hopelessness at 00, and anxiety at 0.  Maintained on routine safety checks. Support and encouragement offered as needed.  Attended group and participated.  States goal for today is "discharge."  Invega Sustenna 234 mg given IM.  No adverse reaction noted.   Patient is safe on and off the unit.

## 2022-09-22 NOTE — [facility] Suicide Risk Assessment (Signed)
[facility] INPATIENT:  Family/Significant Other Suicide Prevention Education  Suicide Prevention Education:  Education Completed; 09-22-2022,  [name] [name] [phone]  has been identified by the patient as the family member/significant other with whom the patient will be residing, and identified as the person(s) who will aid the patient in the event of a mental health crisis (suicidal ideations/suicide attempt).  With written consent from the patient, the family member/significant other has been provided the following suicide prevention education, prior to the and/or following the discharge of the patient.  The suicide prevention education provided includes the following: Suicide risk factors Suicide prevention and interventions National Suicide Hotline telephone number [facility] assessment telephone number [facility] Emergency Assistance 911 [facility] and/or Residential Mobile Crisis Unit telephone number  Request made of family/significant other to: Remove weapons (e.g., guns, rifles, knives), all items previously/currently identified as safety concern.   Remove drugs/medications (over-the-counter, prescriptions, illicit drugs), all items previously/currently identified as a safety concern.  [phone] [name] [name] verbalizes understanding of the suicide prevention education information provided.  The family member/significant other agrees to remove the items of safety concern listed above.  Minor [name] [name] 09/22/2022, 1:28 PM

## 2022-09-22 NOTE — Group Note (Signed)
Date:  09/22/2022 Time:  8:42 PM  Group Topic/Focus:  Wrap-Up Group:   The focus of this group is to help patients review their daily goal of treatment and discuss progress on daily workbooks.    Participation Level:  Did Not Attend   Scot Dock 09/22/2022, 8:42 PM

## 2022-09-22 NOTE — Group Note (Signed)
LCSW Group Therapy Note    Group Date: 09/22/2022 Start Time: 1300 End Time: 1400   Type of Therapy and Topic: Group Therapy: Body Image  Participation Level:  Did Not Attend  Description of Group:  Patients were educated about body image and asked to think about whether they have a healthy or unhealthy body image. Patients were led in a discussion about factors that contribute to body image, both internal and external. Patients were asked to discuss strengths of the human body outside of appearance, such as being able to fight off diseases and provide stress relief. Lastly, patients were asked to identify one way in which they appreciate their own body outside of appearance.   Therapeutic Goals:   1. Patient will differentiate between a healthy and unhealthy body image. 2. Patient will identify what contributes to body image 3. Patient will discuss the strengths of the human body. 4. Patient will identify a positive attribute of their body outside of physical appearance.  Summary of Patient Progress:  Did Not Attend   Therapeutic Modalities: Cognitive Behavioral Therapy; Solution-Focused Therapy  [name] [name] 09/22/2022  2:34 PM

## 2022-09-22 NOTE — Progress Notes (Signed)
Pt refused blood sugar check.

## 2022-09-22 NOTE — Progress Notes (Signed)
Pt refuses BG check.

## 2022-09-22 NOTE — Plan of Care (Signed)
  Problem: Education: Goal: Knowledge of [facility] Education information/materials will improve Outcome: Progressing Goal: Emotional status will improve Outcome: Progressing Goal: Mental status will improve Outcome: Progressing Goal: Verbalization of understanding the information provided will improve Outcome: Progressing   Problem: Activity: Goal: Interest or engagement in activities will improve Outcome: Progressing Goal: Sleeping patterns will improve Outcome: Progressing   Problem: Coping: Goal: Ability to verbalize frustrations and anger appropriately will improve Outcome: Progressing   Problem: Education: Goal: Knowledge of [facility] Education information/materials will improve Outcome: Progressing Goal: Emotional status will improve Outcome: Progressing Goal: Mental status will improve Outcome: Progressing Goal: Verbalization of understanding the information provided will improve Outcome: Progressing   Problem: Activity: Goal: Interest or engagement in activities will improve Outcome: Progressing Goal: Sleeping patterns will improve Outcome: Progressing   Problem: Coping: Goal: Ability to verbalize frustrations and anger appropriately will improve Outcome: Progressing
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
## 2022-09-22 NOTE — BH IP Treatment Plan (Signed)
Interdisciplinary Treatment and Diagnostic Plan Update  09/22/2022 Time of Session: 11:25AM [name] [name] MRN: [id number]  Principal Diagnosis: Schizoaffective disorder, bipolar type [facility])  Secondary Diagnoses: Principal Problem:   Schizoaffective disorder, bipolar type (HCC)   Current Medications:  Current Facility-Administered Medications  Medication Dose Route Frequency Provider Last Rate Last Admin   acetaminophen (TYLENOL) tablet 650 mg  650 mg Oral Q6H PRN Earney Navy, NP       alum & mag hydroxide-simeth (MAALOX/MYLANTA) 200-200-20 MG/5ML suspension 30 mL  30 mL Oral Q4H PRN Dahlia Byes C, NP       diphenhydrAMINE (BENADRYL) capsule 50 mg  50 mg Oral TID PRN Karie Fetch, MD       Or   diphenhydrAMINE (BENADRYL) injection 50 mg  50 mg Intramuscular TID PRN Karie Fetch, MD       divalproex (DEPAKOTE ER) 24 hr tablet 1,000 mg  1,000 mg Oral QHS Karie Fetch, MD   1,000 mg at 09/21/22 2022   hydrOXYzine (ATARAX) tablet 25 mg  25 mg Oral TID PRN Dahlia Byes C, NP   25 mg at 09/19/22 2320   insulin aspart (novoLOG) injection 0-15 Units  0-15 Units Subcutaneous TID WC Onuoha, Josephine C, NP       LORazepam (ATIVAN) tablet 2 mg  2 mg Oral TID PRN Karie Fetch, MD       Or   LORazepam (ATIVAN) injection 2 mg  2 mg Intramuscular TID PRN Karie Fetch, MD       losartan (COZAAR) tablet 50 mg  50 mg Oral Daily Onuoha, Josephine C, NP       magnesium hydroxide (MILK OF MAGNESIA) suspension 30 mL  30 mL Oral Daily PRN Dahlia Byes C, NP       melatonin tablet 3 mg  3 mg Oral QHS PRN Karie Fetch, MD       metFORMIN (GLUCOPHAGE-XR) 24 hr tablet 500 mg  500 mg Oral BID WC Onuoha, Josephine C, NP   500 mg at 09/21/22 1706   metoprolol succinate (TOPROL-XL) 24 hr tablet 25 mg  25 mg Oral QHS Onuoha, Josephine C, NP   25 mg at 09/21/22 2021   OLANZapine zydis (ZYPREXA) disintegrating tablet 10 mg  10 mg Oral TID PRN Karie Fetch, MD        Or   OLANZapine (ZYPREXA) injection 10 mg  10 mg Intramuscular TID PRN Karie Fetch, MD       paliperidone (INVEGA SUSTENNA) injection 234 mg  234 mg Intramuscular Once Clovis Riley, Jerrell L, DO       QUEtiapine (SEROQUEL XR) 24 hr tablet 400 mg  400 mg Oral QHS Karie Fetch, MD   400 mg at 09/21/22 2003   QUEtiapine (SEROQUEL) tablet 100 mg  100 mg Oral BID Karie Fetch, MD   100 mg at 09/21/22 1015   temazepam (RESTORIL) capsule 15 mg  15 mg Oral QHS,MR X 1 Karie Fetch, MD   15 mg at 09/21/22 2023   PTA Medications: Medications Prior to Admission  Medication Sig Dispense Refill Last Dose   benztropine (COGENTIN) 1 MG tablet Take 1 tablet (1 mg total) by mouth 2 (two) times daily. 60 tablet 0    divalproex (DEPAKOTE ER) 500 MG 24 hr tablet Take 2 tablets (1,000 mg total) by mouth at bedtime for 10 days. 20 tablet 0    losartan (COZAAR) 50 MG tablet Take 1 tablet (50 mg total) by mouth daily. 30 tablet 0    metFORMIN (GLUCOPHAGE-XR)  500 MG 24 hr tablet Take 1 tablet (500 mg total) by mouth 2 (two) times daily with a meal. 60 tablet 0    metoprolol succinate (TOPROL-XL) 25 MG 24 hr tablet Take 1 tablet (25 mg total) by mouth at bedtime. 30 tablet 0    paliperidone (INVEGA SUSTENNA) 234 MG/1.5ML injection Inject 234 mg into the muscle once for 1 dose. Administer on 12-05-2021 1.5 mL 0    QUEtiapine (SEROQUEL XR) 200 MG 24 hr tablet Take 200 mg by mouth at bedtime.       Patient Stressors:    Patient Strengths: Forensic psychologist fund of knowledge  Religious Affiliation   Treatment Modalities: Medication Management, Group therapy, Case management,  1 to 1 session with clinician, Psychoeducation, Recreational therapy.   Physician Treatment Plan for Primary Diagnosis: Schizoaffective disorder, bipolar type (HCC) Long Term Goal(s): Improvement in symptoms so as ready for discharge   Short Term Goals: Ability to identify changes in lifestyle to reduce recurrence  of condition will improve Ability to verbalize feelings will improve Ability to demonstrate self-control will improve Ability to identify and develop effective coping behaviors will improve Ability to maintain clinical measurements within normal limits will improve Compliance with prescribed medications will improve  Medication Management: Evaluate patient's response, side effects, and tolerance of medication regimen.  Therapeutic Interventions: 1 to 1 sessions, Unit Group sessions and Medication administration.  Evaluation of Outcomes: Not Progressing  Physician Treatment Plan for Secondary Diagnosis: Principal Problem:   Schizoaffective disorder, bipolar type (HCC)  Long Term Goal(s): Improvement in symptoms so as ready for discharge   Short Term Goals: Ability to identify changes in lifestyle to reduce recurrence of condition will improve Ability to verbalize feelings will improve Ability to demonstrate self-control will improve Ability to identify and develop effective coping behaviors will improve Ability to maintain clinical measurements within normal limits will improve Compliance with prescribed medications will improve     Medication Management: Evaluate patient's response, side effects, and tolerance of medication regimen.  Therapeutic Interventions: 1 to 1 sessions, Unit Group sessions and Medication administration.  Evaluation of Outcomes: Not Progressing   RN Treatment Plan for Primary Diagnosis: Schizoaffective disorder, bipolar type (HCC) Long Term Goal(s): Knowledge of disease and therapeutic regimen to maintain health will improve  Short Term Goals: Ability to remain free from injury will improve, Ability to verbalize frustration and anger appropriately will improve, Ability to participate in decision making will improve, Ability to verbalize feelings will improve, Ability to identify and develop effective coping behaviors will improve, and Compliance with  prescribed medications will improve  Medication Management: RN will administer medications as ordered by provider, will assess and evaluate patient's response and provide education to patient for prescribed medication. RN will report any adverse and/or side effects to prescribing provider.  Therapeutic Interventions: 1 on 1 counseling sessions, Psychoeducation, Medication administration, Evaluate responses to treatment, Monitor vital signs and CBGs as ordered, Perform/monitor CIWA, COWS, AIMS and Fall Risk screenings as ordered, Perform wound care treatments as ordered.  Evaluation of Outcomes: Not Progressing   LCSW Treatment Plan for Primary Diagnosis: Schizoaffective disorder, bipolar type (HCC) Long Term Goal(s): Safe transition to appropriate next level of care at discharge, Engage patient in therapeutic group addressing interpersonal concerns.  Short Term Goals: Engage patient in aftercare planning with referrals and resources, Increase social support, Increase emotional regulation, Facilitate acceptance of mental health diagnosis and concerns, Identify triggers associated with mental health/substance abuse issues, and Increase skills for wellness and  recovery  Therapeutic Interventions: Assess for all discharge needs, 1 to 1 time with Child psychotherapist, Explore available resources and support systems, Assess for adequacy in community support network, Educate family and significant other(s) on suicide prevention, Complete Psychosocial Assessment, Interpersonal group therapy.  Evaluation of Outcomes: Not Progressing   Progress in Treatment: Attending groups: No. Participating in groups: No. Taking medication as prescribed: Yes. Toleration medication: No. Family/Significant other contact made: No, will contact:  consent pending Patient understands diagnosis: No. Discussing patient identified problems/goals with staff: No. Medical problems stabilized or resolved: No. Denies  suicidal/homicidal ideation: Yes. Issues/concerns per patient self-inventory: No.  New problem(s) identified: No, Describe:  none reported  New Short Term/Long Term Goal(s):medication stabilization, elimination of SI thoughts, development of comprehensive mental wellness plan.    Patient Goals:  "to go home or transfer to new hospital"  Discharge Plan or Barriers: Patient recently admitted. CSW will continue to follow and assess for appropriate referrals and possible discharge planning.    Reason for Continuation of Hospitalization: Delusions  Mania Medication stabilization  Estimated Length of Stay:5-7 days  Last 3 [place] Suicide Severity Risk Score: Flowsheet Row Admission (Current) [facility] from [facility]18/2022 in [facility] ED from 08/04/2022 in [facility] Emergency Department at [facility]  C-SSRS RISK CATEGORY No Risk No Risk No Risk       Last PHQ 2/9 Scores:     No data to display          Scribe for Treatment Team: Izell [place], LCSW 09/22/2022 1:11 PM
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
## 2022-09-23 DIAGNOSIS — F250 Schizoaffective disorder, bipolar type: Secondary | ICD-10-CM | POA: Diagnosis not present

## 2022-09-23 LAB — TSH: TSH: 0.224 u[IU]/mL — ABNORMAL LOW (ref 0.350–4.500)

## 2022-09-23 LAB — GLUCOSE, CAPILLARY
Glucose-Capillary: 131 mg/dL — ABNORMAL HIGH (ref 70–99)
Glucose-Capillary: 135 mg/dL — ABNORMAL HIGH (ref 70–99)
Glucose-Capillary: 141 mg/dL — ABNORMAL HIGH (ref 70–99)
Glucose-Capillary: 160 mg/dL — ABNORMAL HIGH (ref 70–99)

## 2022-09-23 LAB — LIPID PANEL
Cholesterol: 163 mg/dL (ref 0–200)
HDL: 52 mg/dL (ref >40)
LDL Cholesterol: 92 mg/dL (ref 0–99)
Total CHOL/HDL Ratio: 3.1 ratio
Triglycerides: 97 mg/dL (ref <150)
VLDL: 19 mg/dL (ref 0–40)

## 2022-09-23 MED ORDER — WHITE PETROLATUM EX OINT
TOPICAL_OINTMENT | CUTANEOUS | Status: AC
  Administered 2022-09-23: 1
  Filled 2022-09-23: qty 5

## 2022-09-23 MED ORDER — DOCUSATE SODIUM 100 MG PO CAPS
100.0000 mg | ORAL_CAPSULE | Freq: Every day | ORAL | Status: DC
  Administered 2022-09-23 – 2022-09-29 (×7): 100 mg via ORAL
  Filled 2022-09-23 (×10): qty 1

## 2022-09-23 NOTE — Plan of Care (Signed)
Problem: Activity: Goal: Sleeping patterns will improve Outcome: Progressing   Problem: Safety: Goal: Periods of time without injury will increase Outcome: Progressing   Problem: Coping: Goal: Will verbalize feelings Outcome: Progressing
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
## 2022-09-23 NOTE — Plan of Care (Signed)
  Problem: Education: Goal: Emotional status will improve Outcome: Progressing   Problem: Activity: Goal: Interest or engagement in activities will improve Outcome: Progressing Goal: Sleeping patterns will improve Outcome: Progressing   Problem: Coping: Goal: Ability to demonstrate self-control will improve Outcome: Progressing   Problem: Physical Regulation: Goal: Ability to maintain clinical measurements within normal limits will improve Outcome: Progressing   Problem: Safety: Goal: Periods of time without injury will increase Outcome: Progressing   Problem: Education: Goal: Mental status will improve Outcome: Not Progressing
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
## 2022-09-23 NOTE — Group Note (Signed)
Date:  09/23/2022 Time:  9:15 PM  Group Topic/Focus:  Wrap-Up Group:   The focus of this group is to help patients review their daily goal of treatment and discuss progress on daily workbooks.    Participation Level:  Did Not Attend   Scot Dock 09/23/2022, 9:15 PM

## 2022-09-23 NOTE — Progress Notes (Signed)
   09/23/22 0030  Psych Admission Type (Psych Patients Only)  Admission Status Involuntary  Psychosocial Assessment  Patient Complaints Suspiciousness  Eye Contact Fair  Facial Expression Flat  Affect Flat  Speech Tangential  Interaction Assertive  Motor Activity Fidgety  Appearance/Hygiene Unremarkable  Behavior Characteristics Cooperative  Mood Preoccupied  Thought Process  Coherency Disorganized;Loose associations  Content Delusions;Preoccupation;Religiosity;Paranoia  Delusions Grandeur;Paranoid;Religious  Perception Illusions  Hallucination None reported or observed  Judgment Poor  Confusion None  Danger to Self  Current suicidal ideation? Denies  Agreement Not to Harm Self Yes  Description of Agreement verbal  Danger to Others  Danger to Others None reported or observed  Danger to Others Abnormal  Harmful Behavior to others No threats or harm toward other people  Destructive Behavior No threats or harm toward property

## 2022-09-23 NOTE — Progress Notes (Signed)
   09/23/22 0600  15 Minute Checks  Location Bedroom (Simultaneous filing. User may not have seen previous data.)  Visual Appearance Calm (Simultaneous filing. User may not have seen previous data.)  Behavior Composed (Simultaneous filing. User may not have seen previous data.)  Sleep (Behavioral Health Patients Only)  Calculate sleep? (Click Yes once per 24 hr at 0600 safety check) Yes  Documented sleep last 24 hours 9.25

## 2022-09-23 NOTE — Group Note (Deleted)
Recreation Therapy Group Note   Group Topic:Animal Assisted Therapy   Group Date: 09/23/2022 Start Time: 0950 End Time: 1035 Facilitators: Oakleigh Hesketh, Benito Mccreedy, LRT Location: [address] Dayroom  Animal-Assisted Activity (AAA) Program Checklist/Progress Note Patient Eligibility Criteria Checklist & Daily Group note for Rec Tx Intervention   AAA/T Program Assumption of Risk Form signed by Patient/ or Parent Legal Guardian YES  Patient is free of allergies or severe asthma  YES  Patient reports no fear of animals YES  Patient reports no history of cruelty to animals YES  Patient understands their participation is voluntary YES  Patient washes hands before animal contact YES  Patient washes hands after animal contact YES   Group Description: Patients provided opportunity to interact with trained and credentialed [employer] [facility] dog and the community volunteer/dog handler. Patients practiced appropriate animal interaction and were educated on dog safety outside of the hospital in common community settings. Patients were allowed to use dog toys and other items to practice commands, engage the dog in play, and/or complete routine aspects of animal care.   Education: Charity fundraiser, Health visitor, Communication & Social Skills     Affect/Mood: {RT BHH Affect/Mood:26271}   Participation Level: {RT BHH Participation [employer]   Participation Quality: {RT BHH Participation Quality:26268}   Behavior: {RT BHH Group Behavior:26269}   Speech/Thought Process: {RT BHH Speech/Thought:26276}   Insight: {RT BHH Insight:26272}   Judgement: {RT BHH Judgement:26278}   Modes of Intervention: {RT BHH Modes of Intervention:26277}   Patient Response to Interventions:  {RT BHH Patient Response to Intervention:26274}   Education Outcome:  {RT BHH Education Outcome:26279}   Clinical Observations/Individualized Feedback: *** was *** in their participation of session  activities and group discussion. Pt identified ***   Plan: {RT BHH Tx YQMV:78469}   Benito Mccreedy Annikah Lovins, LRT,  09/23/2022 11:46 AM

## 2022-09-23 NOTE — Progress Notes (Signed)
   09/23/22 0030  Psych Admission Type (Psych Patients Only)  Admission Status Involuntary  Psychosocial Assessment  Patient Complaints Suspiciousness  Eye Contact Fair  Facial Expression Flat  Affect Flat  Speech Tangential  Interaction Assertive  Motor Activity Fidgety  Appearance/Hygiene Unremarkable  Behavior Characteristics Cooperative  Mood Preoccupied  Thought Process  Coherency Disorganized;Loose associations  Content Delusions;Preoccupation;Religiosity;Paranoia  Delusions Grandeur;Paranoid;Religious  Perception Illusions  Hallucination None reported or observed  Judgment Poor  Confusion None  Danger to Self  Current suicidal ideation? Denies  Agreement Not to Harm Self Yes  Description of Agreement verbal  Danger to Others  Danger to Others None reported or observed  Danger to Others Abnormal  Harmful Behavior to others No threats or harm toward other people  Destructive Behavior No threats or harm toward property   Dar Note: Patient presents with a calm affect and mood.  Medications given as prescribed.  Denies suicidal thoughts, auditory and visual hallucinations.  Reports sedation and drowsiness due to taking Seroquel during the day.  Requesting to take Seroquel only at bedtime.  Preoccupied with getting discharge.  Routine safety checks maintained.  Support and encouragement offered as needed.  Patient is safe on the unit.

## 2022-09-23 NOTE — Progress Notes (Signed)
Pt stated she was told by the doctor she could have her lipstick, pt reminded to talk to the doctor to get an order to go back in her locker, pt stated "I need a diabetic specialist to see me" pt was educated on talking with the doctor so this could be set up either her or after D/C . Pt has been compliant with medications this evening, pt pleasant on the unit with no behavioral issues this evening.    09/23/22 2100  Psych Admission Type (Psych Patients Only)  Admission Status Involuntary  Psychosocial Assessment  Patient Complaints Confusion;Suspiciousness  Eye Contact Fair  Facial Expression Flat  Affect Preoccupied  Speech Tangential  Interaction Assertive  Motor Activity Slow;Fidgety  Appearance/Hygiene Unremarkable  Behavior Characteristics Cooperative  Mood Preoccupied;Pleasant  Aggressive Behavior  Effect No apparent injury  Thought Process  Coherency Loose associations  Content Preoccupation;Religiosity  Delusions Paranoid;Religious;Grandeur  Perception Illusions  Hallucination None reported or observed  Judgment Poor  Confusion None  Danger to Self  Current suicidal ideation? Denies  Danger to Others  Danger to Others None reported or observed  Danger to Others Abnormal  Harmful Behavior to others No threats or harm toward other people  Destructive Behavior No threats or harm toward property

## 2022-09-24 DIAGNOSIS — F250 Schizoaffective disorder, bipolar type: Secondary | ICD-10-CM | POA: Diagnosis not present

## 2022-09-24 LAB — GLUCOSE, CAPILLARY
Glucose-Capillary: 126 mg/dL — ABNORMAL HIGH (ref 70–99)
Glucose-Capillary: 107 mg/dL — ABNORMAL HIGH (ref 70–99)

## 2022-09-24 MED ORDER — DIVALPROEX SODIUM ER 500 MG PO TB24
1000.0000 mg | ORAL_TABLET | Freq: Every day | ORAL | Status: DC
  Administered 2022-09-25 – 2022-09-29 (×5): 1000 mg via ORAL
  Filled 2022-09-24 (×7): qty 2

## 2022-09-24 MED ORDER — PALIPERIDONE PALMITATE ER 156 MG/ML IM SUSY
156.0000 mg | PREFILLED_SYRINGE | Freq: Once | INTRAMUSCULAR | Status: DC
  Filled 2022-09-24: qty 1

## 2022-09-24 MED ORDER — PALIPERIDONE PALMITATE ER 156 MG/ML IM SUSY: 156.0000 mg | PREFILLED_SYRINGE | Freq: Once | INTRAMUSCULAR | Status: DC

## 2022-09-24 MED ORDER — QUETIAPINE FUMARATE ER 300 MG PO TB24
600.0000 mg | ORAL_TABLET | Freq: Every day | ORAL | Status: DC
  Administered 2022-09-24 – 2022-09-28 (×5): 600 mg via ORAL
  Filled 2022-09-24 (×8): qty 2

## 2022-09-24 MED ORDER — PALIPERIDONE PALMITATE ER 156 MG/ML IM SUSY
156.0000 mg | PREFILLED_SYRINGE | Freq: Once | INTRAMUSCULAR | Status: AC
  Administered 2022-09-27: 156 mg via INTRAMUSCULAR

## 2022-09-24 NOTE — Progress Notes (Signed)
Pt refused Bp and CBG checks this evening    09/24/22 2100  Psych Admission Type (Psych Patients Only)  Admission Status Involuntary  Psychosocial Assessment  Patient Complaints Restlessness;Suspiciousness;Worrying  Eye Contact Fair  Facial Expression Flat  Affect Preoccupied  Speech Tangential  Interaction Assertive  Motor Activity Slow;Fidgety  Appearance/Hygiene Unremarkable  Behavior Characteristics Cooperative;Resistant to care  Mood Suspicious;Preoccupied  Aggressive Behavior  Effect No apparent injury  Thought Process  Coherency Loose associations  Content Preoccupation;Religiosity  Delusions Paranoid;Religious;Grandeur  Perception Illusions  Hallucination None reported or observed  Judgment Poor  Confusion None  Danger to Self  Current suicidal ideation? Denies  Danger to Others  Danger to Others None reported or observed  Danger to Others Abnormal  Harmful Behavior to others No threats or harm toward other people  Destructive Behavior No threats or harm toward property

## 2022-09-24 NOTE — Progress Notes (Signed)
Recreation Therapy Notes  Patient admitted to unit 10.19.23. Due to admission within last year, no new recreation therapy assessment conducted at this time. Last assessment conducted on 9.13.24.    Patient was drowsy and slurring speech. Patient stated her medication was making her drowsy.  Reason for current admission per patient, "I called because I needed a tampon removed and a psychological exam".  Patient reports stressor was "roommates fighting each other".  Patient identified coping skills as reading and writing.  Patient identified leisure interest as walking.  Patient identified preaching as a continued strength.  Patients stated area of improvement was writing a letter to her husband about a house she is to be getting next month.  Patient chart reports goal of going home.  Patient denies SI, HI, AVH at this time.    Information found below from assessment conducted 10.19.23.  Coping Skills: Pt stated she uses the hospital as a coping skill.  Leisure Interests: Networking; Talking to people  Patient Strengths: Preaching  Areas of Improvement: "convincing my husband the ways of Christianity. I want to stay home while he works like a wife is supposed to do". "To be discharged to school"      Mayrene Bastarache A Carri Spillers-McCall 09/24/2022 1:49 PM

## 2022-09-24 NOTE — Progress Notes (Addendum)
[facility] MD Progress Note  09/24/2022 10:33 AM [name] [name]  MRN:  [id number]  Principal Problem: Schizoaffective disorder, bipolar type (HCC) Diagnosis: Principal Problem:   Schizoaffective disorder, bipolar type (HCC)  Reason for Admission:  [name] [name] is a 42 y.o. female with a past psychiatric history of schizoaffective disorder bipolar type. Patient initially arrived to [facility] on 09/18/22 for foreign body in vagina (none found), agitation, delusions, and paranoia, and admitted to [facility] under IVC on 09/20/22 for crisis stabalization. PPHx is significant for schizoaffective disorder bipolar type, and no history of Suicide Attempts, Self Injurious Behavior. Patient has had 12 ED visits over the past year as well as multiple prior Psychiatric Hospitalizations (most recent [facility] 07/2021). PMHx is significant for diabetes, HTN    (admitted on 09/19/2022, total  LOS: 5 days )  Yesterday, the psychiatry team made following recommendations:  -- Continue depakote ER 1000mg  at bedtime for mood stabilization -- Continue seroquel 100mg  BID for mood stabilization and psychosis -- Continue seroquel 400mg  at bedtime for mood stabilization and psychosis  -- Continue restoril 15mg  at bedtime and repeat x1 PRN for insomnia  --Received [name] Sustenna 234 mg IM (9/16) with plan to give second dose Thursday (9/19) -- Change agitation protocol from haldol (tied tongue) to zyprexa  Chart review: Overnight Events HR 104. BP 97/75. MAR was reviewed and patient refused morning seroquel, took all scheduled afternoon and nighttime meds.  Patient received no PRNs. Patient slept 7.5 hours. Reported would prefer not to receive Seroquel during the day.   Pertinent information discussed during bed progression:  Case was discussed in the multidisciplinary team. Patient slept 7.5 hours. 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  Information  Obtained Today During Patient Interview: Patient evaluated on the unit. Reports sleep no issues. Reports appetite no issues. States mood is "ready to go" today. Today patient reports improvements in in her mood and states that she is feeling like she is ready to go . She continues to be tangential with pressured and disorganized speech, stating that she came in due to tampon stuck inside of her and to get a psychological evaluation to get her daughter back. She names multiple jobs that she has had. She is now amenable to taking the metformin and her blood pressure medications. Side effects to currently prescribed medications are some sleepiness during the day. She is amenable to increasing her seroquel dose at nighttime and changing her depakote dose to the daytime. There are no somatic complaints. Reports regular bowel movements. Reports last BM was yesterday. Reports physical complaints are none.     On interview, suicidal ideations are not present . Homicidal ideations are not present.  There are paranoid ideations or delusional thought processes.   Reports goals for today include discharge.   Past Psychiatric History: Current Psychiatrist: none Current Therapist: none Previous Psychiatric Diagnoses: schizoaffective disorder bipolar type Current psychiatric medications: depakote 1000 QHS, seroquel 400 QHS Psychiatric medication history/compliance: "nothing work" -per chart review, prior psychiatric medications include depakote, seroquel, restoril, ambien, risperdal, abilify, invega, geodon, trazodone, klonopin, ativan, paliperidone, received paliperidone IM 10/28, 11/07/21 Psychiatric Hospitalization hx: [facility] 07/2022, [facility] 10/2021, 07/2019, 06/2019, 2020x11 Psychotherapy hx: none Neuromodulation history: none  History of suicide (obtained from HPI): denies History of homicide or aggression (obtained in HPI): denies  Family Psychiatric History:  Psychiatric Dx: Drug abuse in maternal  uncle Suicide Hx: unknown Violence/Aggression: "brother accused of killing someone"  Substance  use: cousin and uncle dealt with crack cocaine   Social History:  Living Situation: lives with Art therapist Education:  5 years of university, 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  Occupational hx: I'm a Psychologist, counselling. I worked for [employer], [employer], [employer]. Mr. Tama Headings takes me to work. He is my caretaker. I hire him and take care of him every month.  Marital Status: I had a husband Children: I have 2 children, don't know how old Legal:  none Military: none  Past Medical History:  Past Medical History:  Diagnosis Date   Bipolar affective disorder, currently manic, mild (HCC)    Diabetes mellitus without complication (HCC)    Schizophrenia (HCC)    Family History:  Family History  Problem Relation Age of Onset   Drug abuse Maternal Uncle     Current Medications: Current Facility-Administered Medications  Medication Dose Route Frequency Provider Last Rate Last Admin   acetaminophen (TYLENOL) tablet 650 mg  650 mg Oral Q6H PRN Dahlia Byes C, NP       alum & mag hydroxide-simeth (MAALOX/MYLANTA) 200-200-20 MG/5ML suspension 30 mL  30 mL Oral Q4H PRN Dahlia Byes C, NP   30 mL at 09/23/22 1219   diphenhydrAMINE (BENADRYL) capsule 50 mg  50 mg Oral TID PRN Karie Fetch, MD       Or   diphenhydrAMINE (BENADRYL) injection 50 mg  50 mg Intramuscular TID PRN Karie Fetch, MD       [START ON 09/25/2022] divalproex (DEPAKOTE ER) 24 hr tablet 1,000 mg  1,000 mg Oral Daily Karie Fetch, MD       docusate sodium (COLACE) capsule 100 mg  100 mg Oral Daily Karie Fetch, MD   100 mg at 09/24/22 8295   hydrOXYzine (ATARAX) tablet 25 mg  25 mg Oral TID PRN Dahlia Byes C, NP   25 mg at 09/19/22 2320   insulin aspart (novoLOG) injection 0-15 Units  0-15 Units Subcutaneous TID WC Dahlia Byes C, NP   2 Units at 09/24/22 0603   LORazepam (ATIVAN) tablet 2 mg  2 mg Oral TID PRN Karie Fetch, MD       Or   LORazepam (ATIVAN) injection 2 mg  2 mg Intramuscular TID PRN Karie Fetch, MD       losartan (COZAAR) tablet 50 mg  50 mg Oral Daily Dahlia Byes C, NP   50 mg at 09/24/22 6213   magnesium hydroxide (MILK OF MAGNESIA) suspension 30 mL  30 mL Oral Daily PRN Dahlia Byes C, NP       melatonin tablet 3 mg  3 mg Oral QHS PRN Karie Fetch, MD       metFORMIN (GLUCOPHAGE-XR) 24 hr tablet 500 mg  500 mg Oral BID WC Onuoha, Josephine C, NP   500 mg at 09/24/22 0838   metoprolol succinate (TOPROL-XL) 24 hr tablet 25 mg  25 mg Oral QHS Onuoha, Josephine C, NP   25 mg at 09/23/22 2052   OLANZapine zydis (ZYPREXA) disintegrating tablet 10 mg  10 mg Oral TID PRN Karie Fetch, MD       Or   OLANZapine (ZYPREXA) injection 10 mg  10 mg Intramuscular TID PRN Karie Fetch, MD       [START ON 09/25/2022] paliperidone (INVEGA SUSTENNA) injection 156 mg  156 mg Intramuscular Once Karie Fetch, MD       QUEtiapine (SEROQUEL XR) 24 hr tablet 600 mg  600 mg Oral QHS Karie Fetch, MD  temazepam (RESTORIL) capsule 15 mg  15 mg Oral QHS,MR X 1 Karie Fetch, MD   15 mg at 09/23/22 2053    Lab Results:  Results for orders placed or performed during the hospital encounter of 09/19/22 (from the past 48 hour(s))  Glucose, capillary     Status: Abnormal   Collection Time: 09/23/22 12:38 AM  Result Value Ref Range   Glucose-Capillary 131 (H) 70 - 99 mg/dL    Comment: Glucose reference range applies only to samples taken after fasting for at least 8 hours.  Glucose, capillary     Status: Abnormal   Collection Time: 09/23/22  6:28 AM  Result Value Ref Range   Glucose-Capillary 160 (H) 70 - 99 mg/dL    Comment: Glucose reference range applies only to samples taken after fasting for at least 8 hours.  Glucose, capillary     Status: Abnormal   Collection Time: 09/23/22  5:04 PM  Result Value Ref Range   Glucose-Capillary 141 (H) 70 - 99 mg/dL    Comment:  Glucose reference range applies only to samples taken after fasting for at least 8 hours.  Lipid panel     Status: None   Collection Time: 09/23/22  6:29 PM  Result Value Ref Range   Cholesterol 163 0 - 200 mg/dL   Triglycerides 97 <098 mg/dL   HDL 52 >11 mg/dL   Total CHOL/HDL Ratio 3.1 RATIO   VLDL 19 0 - 40 mg/dL   LDL Cholesterol 92 0 - 99 mg/dL    Comment:        Total Cholesterol/HDL:CHD Risk Coronary Heart Disease Risk Table                     Men   Women  1/2 Average Risk   3.4   3.3  Average Risk       5.0   4.4  2 X Average Risk   9.6   7.1  3 X Average Risk  23.4   11.0        Use the calculated Patient Ratio above and the CHD Risk Table to determine the patient's CHD Risk.        ATP III CLASSIFICATION (LDL):  <100     mg/dL   Optimal  914-782  mg/dL   Near or Above                    Optimal  130-159  mg/dL   Borderline  956-213  mg/dL   High  >086     mg/dL   Very High Performed at [facility], [address] [place], [place] [address]   TSH     Status: Abnormal   Collection Time: 09/23/22  6:29 PM  Result Value Ref Range   TSH 0.224 (L) 0.350 - 4.500 uIU/mL    Comment: Performed by a 3rd Generation assay with a functional sensitivity of <=0.01 uIU/mL. Performed at [facility], [address] [place], [place] [address]   Glucose, capillary     Status: Abnormal   Collection Time: 09/23/22  9:04 PM  Result Value Ref Range   Glucose-Capillary 135 (H) 70 - 99 mg/dL    Comment: Glucose reference range applies only to samples taken after fasting for at least 8 hours.  Glucose, capillary     Status: Abnormal   Collection Time: 09/24/22  5:53 AM  Result Value Ref Range   Glucose-Capillary 126 (H) 70 -  99 mg/dL    Comment: Glucose reference range applies only to samples taken after fasting for at least 8 hours.    Blood Alcohol level:  Lab Results  Component Value Date   ETH <10 09/18/2022   ETH <10 08/04/2022     Metabolic Labs: Lab Results  Component Value Date   HGBA1C 6.9 (H) 09/18/2022   MPG 151 09/18/2022   MPG 125.5 11/21/2021   Lab Results  Component Value Date   PROLACTIN 15.7 10/20/2021   PROLACTIN 46.5 (H) 07/25/2015   Lab Results  Component Value Date   CHOL 163 09/23/2022   TRIG 97 09/23/2022   HDL 52 09/23/2022   CHOLHDL 3.1 09/23/2022   VLDL 19 09/23/2022   LDLCALC 92 09/23/2022   LDLCALC 113 (H) 11/21/2021    Sleep:Sleep: Fair Number of Hours of Sleep: 7.5   Physical Findings: AIMS:  Facial and Oral Movements Muscles of Facial Expression: None, normal Lips and Perioral Area: None, normal Jaw: None, normal Tongue: None, normal,Extremity Movements Upper (arms, wrists, hands, fingers): None, normal Lower (legs, knees, ankles, toes): None, normal Trunk Movements: None, normal  Neck, shoulders, hips: None, normal Overall Severity Severity of abnormal movements (highest score from questions above): None, normal Incapacitation due to abnormal movements: None, normal Patient's awareness of abnormal movements (rate only patient's report): No Awareness, Dental Status Current problems with teeth and/or dentures?: No Does patient usually wear dentures?: No   No stiffness, cogwheeling, or tremors noted on exam.   CIWA:    COWS:     Psychiatric Specialty Exam:  Presentation  General Appearance: Fairly Groomed  Eye Contact:Good  Speech:Pressured  Speech Volume:Normal  Handedness:Right   Mood and Affect  Mood:Labile  Affect:Labile   Thought Process  Thought Processes:Disorganized  Descriptions of Associations:Loose  Orientation:Full (Time, Place and Person)  Thought Content:Delusions; Perseveration; Paranoid Ideation  History of Schizophrenia/Schizoaffective disorder:Yes  Duration of Psychotic Symptoms:Greater than six months  Hallucinations:Hallucinations: None  Ideas of Reference:None  Suicidal Thoughts:Suicidal Thoughts:  No  Homicidal Thoughts:Homicidal Thoughts: No   Sensorium  Memory:Immediate Fair  Judgment:Poor  Insight:Poor   Executive Functions  Concentration:Fair  Attention Span:Poor  Recall:Poor  Fund of Knowledge:Fair  Language:Fair   Psychomotor Activity  Psychomotor Activity:Psychomotor Activity: Normal    Assets  Assets:Physical Health; Resilience; Social Support   Sleep  Sleep:Sleep: Fair Number of Hours of Sleep: 7.5   Physical Exam ROS Physical Exam Constitutional:      Appearance: the patient is not toxic-appearing. Some somnolence during daytime.  Pulmonary:     Effort: Pulmonary effort is normal.  Neurological:     General: No focal deficit present.     Mental Status: the patient is alert and oriented to person, place, and time.  MSK: No EPS or cogwheeling on exam  Review of Systems  Respiratory:  Negative for shortness of breath.   Cardiovascular:  Negative for chest pain.  Gastrointestinal:  Negative for abdominal pain, constipation, diarrhea, nausea and vomiting.  Neurological:  Negative for headaches.    Blood pressure 97/75, pulse (!) 104, temperature 98.1 F (36.7 C), temperature source Oral, resp. rate 20, height 5\' 2"  (1.575 m), weight 88 kg, last menstrual period 09/11/2022, SpO2 100%, unknown if currently breastfeeding. Body mass index is 35.48 kg/m.  Treatment Plan Summary: Daily contact with patient to assess and evaluate symptoms and progress in treatment, Medication management, and Plan    ASSESSMENT: [name] [name] is a 42 y.o. female  with a past psychiatric history of schizoaffective disorder  bipolar type. Patient initially arrived to [facility] on 09/18/22 for foreign body in vagina (none found), agitation, delusions, and paranoia, and admitted to [facility] under IVC on 09/20/22 for crisis stabalization. PPHx is significant for schizoaffective disorder bipolar type, and no history of Suicide Attempts, Self Injurious Behavior. Patient has  had 12 ED visits over the past year as well as multiple prior Psychiatric Hospitalizations (most recent [facility] 07/2021). PMHx is significant for diabetes, HTN.   Patient continues to be disorganized, delusional with tangential speech though she is calmer compared to on admission. She continues to report having multiple jobs. Plan to change depakote to AM and seroquel to at bedtime due to concern for somnolence.   Diagnoses / Active Problems: Schizoaffective disorder bipolar type, current episode manic   PLAN: Safety and Monitoring:  --  INVOLUNTARY admission to inpatient psychiatric unit for safety, stabilization and treatment  -- Daily contact with patient to assess and evaluate symptoms and progress in treatment  -- Patient's case to be discussed in multi-disciplinary team meeting  -- Observation Level : q15 minute checks  -- Vital signs:  q12 hours  -- Precautions: suicide, elopement, and assault  2. Psychiatric Diagnoses and Treatment:  -- Modify depakote ER 1000mg  in AM for mood stabilization  Depakote level for 9/21 AM -- Modify seroquel ER 600mg  QHS for mood stabilization and psychosis due to somnolence            -- Continue restoril 15mg  at bedtime and repeat x1 PRN for insomnia  --Received [name] Sustenna 234 mg IM (9/16) with plan to give second dose Saturday (9/21) -- Change agitation protocol from haldol (tied tongue) to zyprexa  -- The risks/benefits/side-effects/alternatives to this medication were discussed in detail with the patient and time was given for questions. The patient consents to medication trial.              -- Metabolic profile and EKG monitoring obtained while on an atypical antipsychotic  BMI: 35.48 TSH:  Lab Results  Component Value Date   TSH 0.224 (L) 09/23/2022   Lipid Panel: LDL 92 HbgA1c: 6.9 QTc: 400              -- Encouraged patient to participate in unit milieu and in scheduled group therapies   -- Short Term Goals: Ability to identify  changes in lifestyle to reduce recurrence of condition will improve, Ability to verbalize feelings will improve, Ability to demonstrate self-control will improve, Ability to identify and develop effective coping behaviors will improve, Ability to maintain clinical measurements within normal limits will improve, and Compliance with prescribed medications will improve   -- Long Term Goals: Improvement in symptoms so as ready for discharge   Other PRNS:  -- start acetaminophen 650 mg every 6 hours as needed for mild to moderate pain, fever, and headaches              -- start hydroxyzine 25 mg three times a day as needed for anxiety              -- start aluminum-magnesium hydroxide + simethicone 30 mL every 4 hours as needed for heartburn or indigestion            -- As needed agitation protocol in-place   3. Medical Issues Being Addressed:   #DM  Continue home metformin 500 BID    #HTN - Continue home metoprolol XL 25mg  at bedtime    #Prolonged Qtc, resolved -repeat EKG Qtc 400   #Constipation -  start colace daily  4. Discharge Planning:   -- Social work and case management to assist with discharge planning and identification of hospital follow-up needs prior to discharge   SW working on ACT team referral  -- Estimated LOS: 5-7 days  -- Discharge Concerns: Need to establish a safety plan; Medication compliance and effectiveness  -- Discharge Goals: Return home with outpatient referrals for mental health follow-up including medication management/psychotherapy   I certify that inpatient services furnished can reasonably be expected to improve the patient's condition.   This note was created using a voice recognition software as a result there may be grammatical errors inadvertently enclosed that do not reflect the nature of this encounter. Every attempt is made to correct such errors.   Karie Fetch, MD, PGY-2 9/18/202410:33 AM

## 2022-09-24 NOTE — Progress Notes (Signed)
Patient approached this RN stating that there were painful bumps under each arm. Upon assessment, solid nodules were noted without redness and discharge. Provider notified.

## 2022-09-24 NOTE — Progress Notes (Signed)
Pt stated that she would prefer not to receive Seroquel during the day. Pt verbalized that taking Seroquel during the day makes her sleepy and makes her sleep through group sessions.

## 2022-09-24 NOTE — Plan of Care (Signed)
  Problem: Activity: Goal: Interest or engagement in activities will improve Outcome: Progressing Goal: Sleeping patterns will improve Outcome: Progressing   Problem: Coping: Goal: Coping ability will improve Outcome: Progressing Goal: Will verbalize feelings Outcome: Progressing   Problem: Safety: Goal: Ability to remain free from injury will improve Outcome: Progressing   Problem: Education: Goal: Emotional status will improve Outcome: Not Progressing Goal: Mental status will improve Outcome: Not Progressing

## 2022-09-24 NOTE — Plan of Care (Signed)
°  Problem: Education: °Goal: Emotional status will improve °Outcome: Progressing °Goal: Mental status will improve °Outcome: Progressing °Goal: Verbalization of understanding the information provided will improve °Outcome: Progressing °  °

## 2022-09-24 NOTE — Group Note (Signed)
Recreation Therapy Group Note   Group Topic:Leisure Education  Group Date: 09/24/2022 Start Time: 1030 End Time: 1105 Facilitators: Dodi Leu-McCall, LRT,CTRS Location: [address] Dayroom   Group Topic: Leisure Education   Goal Area(s) Addresses:  Patient will successfully identify positive leisure and recreation activities.  Patient will acknowledge benefits of participation in healthy leisure activities post discharge.  Patient will actively work with peers toward a shared goal.   Intervention: Group Game    Group Description: Music Trivia. LRT asked patients music questions about oldies but goodies and 90s, 2000s hip hop and r&b. In one group, patients worked together to figure out the next lyric to the songs presented in group. The activity was also used to show patients that leisure didn't have to be an expensive activity and can be enjoyed as a group as well as individually.    Education:  Teacher, English as a foreign language, Leisure as Merchant navy officer, Programmer, applications, Building control surveyor   Education Outcome: Acknowledges education/In group clarification offered/Needs additional education   Affect/Mood: N/A   Participation Level: Did not attend    Clinical Observations/Individualized Feedback:     Plan: Continue to engage patient in RT group sessions 2-3x/week.   Janecia Palau-McCall, LRT,CTRS 09/24/2022 1:40 PM

## 2022-09-24 NOTE — B[facility] Group Notes (Signed)
Adult Psychoeducational Group Note  Date:  09/24/2022 Time:  8:40 PM  Group Topic/Focus:  Wrap-Up Group:   The focus of this group is to help patients review their daily goal of treatment and discuss progress on daily workbooks.  Participation Level:  Did Not Attend   Additional Comments:  Pt did not attend wrap up group.   Sarika Baldini Brayton Mars 09/24/2022, 8:40 PM

## 2022-09-24 NOTE — Progress Notes (Signed)
   09/24/22 0830  Psych Admission Type (Psych Patients Only)  Admission Status Involuntary  Psychosocial Assessment  Patient Complaints Suspiciousness;Irritability  Eye Contact Fair  Facial Expression Flat  Affect Preoccupied  Speech Tangential  Interaction Assertive  Motor Activity Fidgety  Appearance/Hygiene Unremarkable  Behavior Characteristics Cooperative  Mood Preoccupied  Thought Process  Coherency Loose associations;Disorganized;Tangential  Content Preoccupation;Religiosity  Delusions Paranoid;Religious;Grandeur  Perception Illusions  Hallucination None reported or observed  Judgment Poor  Confusion None  Danger to Self  Current suicidal ideation? Denies  Agreement Not to Harm Self Yes  Description of Agreement Verbal  Danger to Others  Danger to Others None reported or observed  Danger to Others Abnormal  Harmful Behavior to others No threats or harm toward other people  Destructive Behavior No threats or harm toward property

## 2022-09-24 NOTE — Progress Notes (Signed)
   09/24/22 0558  15 Minute Checks  Location Dayroom  Visual Appearance Calm  Behavior Composed  Sleep (Behavioral Health Patients Only)  Calculate sleep? (Click Yes once per 24 hr at 0600 safety check) Yes  Documented sleep last 24 hours 7.5

## 2022-09-24 NOTE — Progress Notes (Signed)
CSW met with pt to discuss ACCT referral, to which pt agreed would be a "good fit". CSW encouraged pt to go out side during the recreational time. Pt agreed and went outside. Will continue to monitor.

## 2022-09-25 LAB — GLUCOSE, CAPILLARY
Glucose-Capillary: 158 mg/dL — ABNORMAL HIGH (ref 70–99)
Glucose-Capillary: 123 mg/dL — ABNORMAL HIGH (ref 70–99)
Glucose-Capillary: 82 mg/dL (ref 70–99)
Glucose-Capillary: 136 mg/dL — ABNORMAL HIGH (ref 70–99)

## 2022-09-25 NOTE — Progress Notes (Signed)
CSW completed ACCT referral on line to [facility]. Will continue to monitor.

## 2022-09-25 NOTE — Group Note (Signed)
Recreation Therapy Group Note   Group Topic:Relaxation  Group Date: 09/25/2022 Start Time: 1005 End Time: 1050 Facilitators: Shuntia Exton-McCall, LRT,CTRS Location: [address] Dayroom   Group Topic: Stress Management  Goal Area(s) Addresses:  Patient will identify positive stress management techniques. Patient will identify benefits of using stress management post d/c.  Group Description: Music Therapy. LRT gave patients the opportunity to request songs that relax them. Patients could request any songs of their choosing as long as the songs were clean and appropriate.   Education:  Stress Management, Discharge Planning.   Education Outcome: Acknowledges Education   Affect/Mood: N/A   Participation Level: Did not attend    Clinical Observations/Individualized Feedback:     Plan: Continue to engage patient in RT group sessions 2-3x/week.   [name] [name], LRT,CTRS 09/25/2022 11:29 AM

## 2022-09-25 NOTE — Progress Notes (Signed)
Pt refused labs this evening.

## 2022-09-25 NOTE — BHH Group Notes (Signed)
Pt was encouraged but refused to attend wrap up group discussion

## 2022-09-25 NOTE — Progress Notes (Addendum)
[facility] MD Progress Note  09/25/2022 11:31 AM [name] [name]  MRN:  [id number]  Principal Problem: Schizoaffective disorder, bipolar type (HCC) Diagnosis: Principal Problem:   Schizoaffective disorder, bipolar type (HCC)  Reason for Admission:  [name] [name] is a 42 y.o. female with a past psychiatric history of schizoaffective disorder bipolar type. Patient initially arrived to [facility] on 09/18/22 for foreign body in vagina (none found), agitation, delusions, and paranoia, and admitted to [facility] under IVC on 09/20/22 for crisis stabalization. PPHx is significant for schizoaffective disorder bipolar type, and no history of Suicide Attempts, Self Injurious Behavior. Patient has had 12 ED visits over the past year as well as multiple prior Psychiatric Hospitalizations (most recent [facility] 07/2021). PMHx is significant for diabetes, HTN    (admitted on 09/19/2022, total  LOS: 6 days )  Yesterday, the psychiatry team made following recommendations:  -- Modify depakote ER 1000mg  in AM for mood stabilization    Depakote level for 9/21 AM -- Modify seroquel ER 600mg  QHS for mood stabilization and psychosis due to somnolence -- Continue restoril 15mg  at bedtime and repeat x1 PRN for insomnia --Received [name] Sustenna 234 mg IM (9/16) with plan to give second dose Saturday (9/21) -- Change agitation protocol from haldol (tied tongue) to zyprexa  Chart review: Overnight Events HR 110. BP 107/82. MAR was reviewed and patient took psychotropic medications, refused nighttime metoprolol and CBG check. Patient received no PRNs. Patient slept 5.75 hours. TSH 0.224.   Pertinent information discussed during bed progression:  Case was discussed in the multidisciplinary team. Patient slept 5.75 hours. Patient was "up and down" much of the night, not getting a consistent sleep.   Information Obtained Today During Patient Interview: Patient evaluated on the unit. Reports sleep no issues, reports she feels  "well-rested". Reports appetite no issues. States mood is "ready to go, my family is waiting to surprise me" today. Today patient reports improvements in in her mood and states that she is feeling like she is ready to go . 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 She denies side effects to currently prescribed medications. She is amenable to increasing her seroquel dose at nighttime and changing her depakote dose to the daytime. Reports painful lumps in her armpit. Discussed will obtain CBC to rule out infection. Reports regular bowel movements. Reports last BM was yesterday. Reports physical complaints are painful lump in armpit. She states that this is a side effect of the medications.     On interview, suicidal ideations are not present . Homicidal ideations are not present.  There are paranoid ideations or delusional thought processes.   Reports goals for today include discharge.   Past Psychiatric History: Current Psychiatrist: none Current Therapist: none Previous Psychiatric Diagnoses: schizoaffective disorder bipolar type Current psychiatric medications: depakote 1000 QHS, seroquel 400 QHS Psychiatric medication history/compliance: "nothing work" -per chart review, prior psychiatric medications include depakote, seroquel, restoril, ambien, risperdal, abilify, invega, geodon, trazodone, klonopin, ativan, paliperidone, received paliperidone IM 10/28, 11/07/21 Psychiatric Hospitalization hx: [name] [name] 07/2022, [facility] 10/2021, 07/2019, 06/2019, 2020x11 Psychotherapy hx: none Neuromodulation history: none  History of suicide (obtained from HPI): denies History of homicide or aggression (obtained in HPI): denies  Family Psychiatric History:  Psychiatric Dx: Drug abuse in maternal uncle Suicide Hx: unknown Violence/Aggression: "brother accused  of killing someone"  Substance use: cousin and  uncle dealt with crack cocaine   Social History:  Living Situation: lives with Art therapist Education:  5 years of university, 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  Occupational hx: I'm a Psychologist, counselling. I worked for [employer], [employer], [employer]. Mr. Tama Headings takes me to work. He is my caretaker. I hire him and take care of him every month.  Marital Status: I had a husband Children: I have 2 children, don't know how old Legal:  none Military: none  Past Medical History:  Past Medical History:  Diagnosis Date   Bipolar affective disorder, currently manic, mild (HCC)    Diabetes mellitus without complication (HCC)    Schizophrenia (HCC)    Family History:  Family History  Problem Relation Age of Onset   Drug abuse Maternal Uncle     Current Medications: Current Facility-Administered Medications  Medication Dose Route Frequency Provider Last Rate Last Admin   acetaminophen (TYLENOL) tablet 650 mg  650 mg Oral Q6H PRN Dahlia Byes C, NP       alum & mag hydroxide-simeth (MAALOX/MYLANTA) 200-200-20 MG/5ML suspension 30 mL  30 mL Oral Q4H PRN Dahlia Byes C, NP   30 mL at 09/23/22 1219   diphenhydrAMINE (BENADRYL) capsule 50 mg  50 mg Oral TID PRN Karie Fetch, MD       Or   diphenhydrAMINE (BENADRYL) injection 50 mg  50 mg Intramuscular TID PRN Karie Fetch, MD       divalproex (DEPAKOTE ER) 24 hr tablet 1,000 mg  1,000 mg Oral Daily Karie Fetch, MD   1,000 mg at 09/25/22 4098   docusate sodium (COLACE) capsule 100 mg  100 mg Oral Daily Karie Fetch, MD   100 mg at 09/25/22 1191   hydrOXYzine (ATARAX) tablet 25 mg  25 mg Oral TID PRN Dahlia Byes C, NP   25 mg at 09/19/22 2320   insulin aspart (novoLOG) injection 0-15 Units  0-15 Units Subcutaneous TID WC Dahlia Byes C, NP   3 Units at 09/25/22 0622   LORazepam (ATIVAN) tablet 2 mg  2 mg Oral TID PRN Karie Fetch, MD       Or   LORazepam (ATIVAN) injection 2 mg  2  mg Intramuscular TID PRN Karie Fetch, MD       losartan (COZAAR) tablet 50 mg  50 mg Oral Daily Dahlia Byes C, NP   50 mg at 09/25/22 4782   magnesium hydroxide (MILK OF MAGNESIA) suspension 30 mL  30 mL Oral Daily PRN Dahlia Byes C, NP       melatonin tablet 3 mg  3 mg Oral QHS PRN Karie Fetch, MD       metFORMIN (GLUCOPHAGE-XR) 24 hr tablet 500 mg  500 mg Oral BID WC Onuoha, Josephine C, NP   500 mg at 09/25/22 0821   metoprolol succinate (TOPROL-XL) 24 hr tablet 25 mg  25 mg Oral QHS Onuoha, Josephine C, NP   25 mg at 09/23/22 2052   OLANZapine zydis (ZYPREXA) disintegrating tablet 10 mg  10 mg Oral TID PRN Karie Fetch, MD       Or   OLANZapine (ZYPREXA) injection 10 mg  10 mg Intramuscular TID PRN Karie Fetch, MD       Melene Muller ON 09/27/2022] paliperidone (INVEGA SUSTENNA) injection 156 mg  156 mg Intramuscular Once Abbott Pao, Nadir, MD       QUEtiapine (SEROQUEL XR) 24 hr tablet 600 mg  600 mg Oral QHS Karie Fetch, MD   600 mg at 09/24/22 2049  temazepam (RESTORIL) capsule 15 mg  15 mg Oral QHS,MR X 1 Karie Fetch, MD   15 mg at 09/24/22 2050    Lab Results:  Results for orders placed or performed during the hospital encounter of 09/19/22 (from the past 48 hour(s))  Glucose, capillary     Status: Abnormal   Collection Time: 09/23/22  5:04 PM  Result Value Ref Range   Glucose-Capillary 141 (H) 70 - 99 mg/dL    Comment: Glucose reference range applies only to samples taken after fasting for at least 8 hours.  Lipid panel     Status: None   Collection Time: 09/23/22  6:29 PM  Result Value Ref Range   Cholesterol 163 0 - 200 mg/dL   Triglycerides 97 <027 mg/dL   HDL 52 >25 mg/dL   Total CHOL/HDL Ratio 3.1 RATIO   VLDL 19 0 - 40 mg/dL   LDL Cholesterol 92 0 - 99 mg/dL    Comment:        Total Cholesterol/HDL:CHD Risk Coronary Heart Disease Risk Table                     Men   Women  1/2 Average Risk   3.4   3.3  Average Risk       5.0   4.4  2 X  Average Risk   9.6   7.1  3 X Average Risk  23.4   11.0        Use the calculated Patient Ratio above and the CHD Risk Table to determine the patient's CHD Risk.        ATP III CLASSIFICATION (LDL):  <100     mg/dL   Optimal  366-440  mg/dL   Near or Above                    Optimal  130-159  mg/dL   Borderline  347-425  mg/dL   High  >956     mg/dL   Very High Performed at [facility], [address] [place], [place] [address]   TSH     Status: Abnormal   Collection Time: 09/23/22  6:29 PM  Result Value Ref Range   TSH 0.224 (L) 0.350 - 4.500 uIU/mL    Comment: Performed by a 3rd Generation assay with a functional sensitivity of <=0.01 uIU/mL. Performed at [facility], [address] [place], [place] [address]   Glucose, capillary     Status: Abnormal   Collection Time: 09/23/22  9:04 PM  Result Value Ref Range   Glucose-Capillary 135 (H) 70 - 99 mg/dL    Comment: Glucose reference range applies only to samples taken after fasting for at least 8 hours.  Glucose, capillary     Status: Abnormal   Collection Time: 09/24/22  5:53 AM  Result Value Ref Range   Glucose-Capillary 126 (H) 70 - 99 mg/dL    Comment: Glucose reference range applies only to samples taken after fasting for at least 8 hours.  Glucose, capillary     Status: Abnormal   Collection Time: 09/24/22 12:02 PM  Result Value Ref Range   Glucose-Capillary 107 (H) 70 - 99 mg/dL    Comment: Glucose reference range applies only to samples taken after fasting for at least 8 hours.  Glucose, capillary     Status: Abnormal   Collection Time: 09/25/22  5:58 AM  Result Value Ref Range   Glucose-Capillary 158 (H) 70 -  99 mg/dL    Comment: Glucose reference range applies only to samples taken after fasting for at least 8 hours.    Blood Alcohol level:  Lab Results  Component Value Date   ETH <10 09/18/2022   ETH <10 08/04/2022    Metabolic Labs: Lab Results  Component  Value Date   HGBA1C 6.9 (H) 09/18/2022   MPG 151 09/18/2022   MPG 125.5 11/21/2021   Lab Results  Component Value Date   PROLACTIN 15.7 10/20/2021   PROLACTIN 46.5 (H) 07/25/2015   Lab Results  Component Value Date   CHOL 163 09/23/2022   TRIG 97 09/23/2022   HDL 52 09/23/2022   CHOLHDL 3.1 09/23/2022   VLDL 19 09/23/2022   LDLCALC 92 09/23/2022   LDLCALC 113 (H) 11/21/2021    Sleep:Sleep: Fair Number of Hours of Sleep: 5.75   Physical Findings: AIMS:  Facial and Oral Movements Muscles of Facial Expression: None, normal Lips and Perioral Area: None, normal Jaw: None, normal Tongue: None, normal,Extremity Movements Upper (arms, wrists, hands, fingers): None, normal Lower (legs, knees, ankles, toes): None, normal Trunk Movements: None, normal  Neck, shoulders, hips: None, normal Overall Severity Severity of abnormal movements (highest score from questions above): None, normal Incapacitation due to abnormal movements: None, normal Patient's awareness of abnormal movements (rate only patient's report): No Awareness, Dental Status Current problems with teeth and/or dentures?: No Does patient usually wear dentures?: No   No stiffness, cogwheeling, or tremors noted on exam.   CIWA:    COWS:     Psychiatric Specialty Exam:  Presentation  General Appearance: Fairly Groomed  Eye Contact:-- (Intense, continued eye contact)  Speech:Pressured  Speech Volume:Normal  Handedness:Right   Mood and Affect  Mood:Labile; Irritable  Affect:Labile   Thought Process  Thought Processes:Disorganized  Descriptions of Associations:Loose  Orientation:Full (Time, Place and Person)  Thought Content:Delusions; Perseveration; Paranoid Ideation  History of Schizophrenia/Schizoaffective disorder:Yes  Duration of Psychotic Symptoms:Greater than six months  Hallucinations:Hallucinations: None  Ideas of Reference:Delusions  Suicidal Thoughts:Suicidal Thoughts:  No  Homicidal Thoughts:Homicidal Thoughts: No   Sensorium  Memory:Immediate Fair  Judgment:Poor  Insight:Poor   Executive Functions  Concentration:Fair  Attention Span:Poor  Recall:Poor  Fund of Knowledge:Fair  Language:Fair   Psychomotor Activity  Psychomotor Activity:Psychomotor Activity: Normal    Assets  Assets:Physical Health; Resilience; Social Support   Sleep  Sleep:Sleep: Fair Number of Hours of Sleep: 5.75   Physical Exam ROS Physical Exam Constitutional:      Appearance: the patient is not toxic-appearing. Some somnolence during daytime.  Pulmonary:     Effort: Pulmonary effort is normal.  Neurological:     General: No focal deficit present.     Mental Status: the patient is alert and oriented to person, place, and time.  MSK: No EPS or cogwheeling on exam EXT: Tender, hard <1cm nodules noted on deep palpation in bilateral armpits. No redness or drainage noted.   Review of Systems  Respiratory:  Negative for shortness of breath.   Cardiovascular:  Negative for chest pain.  Gastrointestinal:  Negative for abdominal pain, constipation, diarrhea, nausea and vomiting.  Neurological:  Negative for headaches.  EXT: positive for tenderness in bilateral armpits    Blood pressure 107/82, pulse (!) 110, temperature 98.1 F (36.7 C), temperature source Oral, resp. rate 20, height 5\' 2"  (1.575 m), weight 88 kg, last menstrual period 09/11/2022, SpO2 100%, unknown if currently breastfeeding. Body mass index is 35.48 kg/m.  Treatment Plan Summary: Daily contact with patient to assess  and evaluate symptoms and progress in treatment, Medication management, and Plan    ASSESSMENT: Aune Ariel is a 42 y.o. female  with a past psychiatric history of schizoaffective disorder bipolar type. Patient initially arrived to [facility] on 09/18/22 for foreign body in vagina (none found), agitation, delusions, and paranoia, and admitted to [facility] under IVC on 09/20/22  for crisis stabalization. PPHx is significant for schizoaffective disorder bipolar type, and no history of Suicide Attempts, Self Injurious Behavior. Patient has had 12 ED visits over the past year as well as multiple prior Psychiatric Hospitalizations (most recent [facility] 07/2021). PMHx is significant for diabetes, HTN.   Patient continues to be disorganized, delusional with tangential, pressured speech though she is calmer compared to on admission. Continue depakote in the AM and monitor for somnolence as well as seroquel at nighttime. She's had decreased sleep compared to prior night, may need to change depakote to nighttime as well if she continues to have issues with sleep. Examined her underarms, there was no evidence of redness or purulence. There was some mild tenderness with palpation and <1cm nodules. She has been afebrile. Will obtain CBC to r/o infectious process.   Diagnoses / Active Problems: Schizoaffective disorder bipolar type, current episode manic   PLAN: Safety and Monitoring:  --  INVOLUNTARY admission to inpatient psychiatric unit for safety, stabilization and treatment  -- Daily contact with patient to assess and evaluate symptoms and progress in treatment  -- Patient's case to be discussed in multi-disciplinary team meeting  -- Observation Level : q15 minute checks  -- Vital signs:  q12 hours  -- Precautions: suicide, elopement, and assault  2. Psychiatric Diagnoses and Treatment:  -- Continue depakote ER 1000mg  in AM for mood stabilization  Depakote level for 9/21 AM -- Continue seroquel ER 600mg  QHS for mood stabilization and psychosis due to somnolence            -- Continue restoril 15mg  at bedtime and repeat x1 PRN for insomnia  --Received [name] Sustenna 234 mg IM (9/16) with plan to give second dose Saturday (9/21) -- Change agitation protocol from haldol (tied tongue) to zyprexa  -- The risks/benefits/side-effects/alternatives to this medication were  discussed in detail with the patient and time was given for questions. The patient consents to medication trial.              -- Metabolic profile and EKG monitoring obtained while on an atypical antipsychotic  BMI: 35.48 TSH:  Lab Results  Component Value Date   TSH 0.224 (L) 09/23/2022   Lipid Panel: LDL 92 HbgA1c: 6.9 QTc: 400              -- Encouraged patient to participate in unit milieu and in scheduled group therapies   -- Short Term Goals: Ability to identify changes in lifestyle to reduce recurrence of condition will improve, Ability to verbalize feelings will improve, Ability to demonstrate self-control will improve, Ability to identify and develop effective coping behaviors will improve, Ability to maintain clinical measurements within normal limits will improve, and Compliance with prescribed medications will improve   -- Long Term Goals: Improvement in symptoms so as ready for discharge   Other PRNS:  -- start acetaminophen 650 mg every 6 hours as needed for mild to moderate pain, fever, and headaches              -- start hydroxyzine 25 mg three times a day as needed for anxiety              --  start aluminum-magnesium hydroxide + simethicone 30 mL every 4 hours as needed for heartburn or indigestion            -- As needed agitation protocol in-place   3. Medical Issues Being Addressed:   #DM  Continue home metformin 500 BID    #HTN - Continue home metoprolol XL 25mg  at bedtime    #Prolonged Qtc, resolved -repeat EKG Qtc 400   #Constipation -start colace daily  #Armpit nodule  Has been afebrile. No signs of infection on exam.  -f/u CBC -continue to monitor   4. Discharge Planning:   -- Social work and case management to assist with discharge planning and identification of hospital follow-up needs prior to discharge   SW working on ACT team referral  -- Estimated LOS: 5-7 days  -- Discharge Concerns: Need to establish a safety plan; Medication compliance and  effectiveness  -- Discharge Goals: Return home with outpatient referrals for mental health follow-up including medication management/psychotherapy   I certify that inpatient services furnished can reasonably be expected to improve the patient's condition.   This note was created using a voice recognition software as a result there may be grammatical errors inadvertently enclosed that do not reflect the nature of this encounter. Every attempt is made to correct such errors.   Karie Fetch, MD, PGY-2 9/19/202411:31 AM

## 2022-09-25 NOTE — Progress Notes (Signed)
   09/25/22 1400  Psych Admission Type (Psych Patients Only)  Admission Status Involuntary  Psychosocial Assessment  Patient Complaints Restlessness;Suspiciousness;Worrying  Eye Contact Fair  Facial Expression Flat  Affect Preoccupied  Speech Tangential  Interaction Assertive  Motor Activity Fidgety  Appearance/Hygiene Unremarkable  Behavior Characteristics Cooperative  Mood Suspicious;Preoccupied  Thought Process  Coherency Loose associations  Content Preoccupation;Religiosity  Delusions Grandeur;Paranoid;Religious  Perception Illusions  Hallucination None reported or observed  Judgment Poor  Confusion None  Danger to Self  Current suicidal ideation? Denies  Agreement Not to Harm Self Yes  Description of Agreement verbal contract  Danger to Others  Danger to Others None reported or observed

## 2022-09-25 NOTE — Progress Notes (Signed)
Pt has been up and down much of the night, not getting a consistent sleep

## 2022-09-26 ENCOUNTER — Encounter (HOSPITAL_COMMUNITY): Payer: Self-pay

## 2022-09-26 DIAGNOSIS — F250 Schizoaffective disorder, bipolar type: Secondary | ICD-10-CM | POA: Diagnosis not present

## 2022-09-26 LAB — CBC WITH DIFFERENTIAL/PLATELET
Abs Immature Granulocytes: 0.01 10*3/uL (ref 0.00–0.07)
Basophils Absolute: 0 10*3/uL (ref 0.0–0.1)
Basophils Relative: 0 %
Eosinophils Absolute: 0.1 10*3/uL (ref 0.0–0.5)
Eosinophils Relative: 2 %
HCT: 34.8 % — ABNORMAL LOW (ref 36.0–46.0)
Hemoglobin: 11.5 g/dL — ABNORMAL LOW (ref 12.0–15.0)
Immature Granulocytes: 0 %
Lymphocytes Relative: 39 %
Lymphs Abs: 2.2 10*3/uL (ref 0.7–4.0)
MCH: 31.2 pg (ref 26.0–34.0)
MCHC: 33 g/dL (ref 30.0–36.0)
MCV: 94.3 fL (ref 80.0–100.0)
Monocytes Absolute: 0.5 10*3/uL (ref 0.1–1.0)
Monocytes Relative: 10 %
Neutro Abs: 2.8 10*3/uL (ref 1.7–7.7)
Neutrophils Relative %: 49 %
Platelets: 187 10*3/uL (ref 150–400)
RBC: 3.69 MIL/uL — ABNORMAL LOW (ref 3.87–5.11)
RDW: 13.9 % (ref 11.5–15.5)
WBC: 5.6 10*3/uL (ref 4.0–10.5)
nRBC: 0 % (ref 0.0–0.2)

## 2022-09-26 LAB — GLUCOSE, CAPILLARY
Glucose-Capillary: 128 mg/dL — ABNORMAL HIGH (ref 70–99)
Glucose-Capillary: 143 mg/dL — ABNORMAL HIGH (ref 70–99)
Glucose-Capillary: 141 mg/dL — ABNORMAL HIGH (ref 70–99)

## 2022-09-26 MED ORDER — WHITE PETROLATUM EX OINT
TOPICAL_OINTMENT | CUTANEOUS | Status: AC
  Administered 2022-09-26: 1
  Filled 2022-09-26: qty 5

## 2022-09-26 NOTE — Progress Notes (Signed)
   09/26/22 0558  15 Minute Checks  Location Bedroom  Visual Appearance Calm  Behavior Composed  Sleep (Behavioral Health Patients Only)  Calculate sleep? (Click Yes once per 24 hr at 0600 safety check) Yes  Documented sleep last 24 hours 6.25
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
## 2022-09-26 NOTE — Progress Notes (Signed)
   09/26/22 2230  Psych Admission Type (Psych Patients Only)  Admission Status Involuntary  Psychosocial Assessment  Patient Complaints Suspiciousness;Worrying  Eye Contact Fair  Facial Expression Flat  Affect Appropriate to circumstance  Child psychotherapist Assertive  Motor Activity Fidgety  Appearance/Hygiene Unremarkable  Behavior Characteristics Cooperative  Mood Preoccupied  Aggressive Behavior  Effect No apparent injury  Thought Process  Coherency Loose associations  Content Preoccupation;Religiosity  Delusions Grandeur;Paranoid;Religious  Perception Derealization;Illusions  Hallucination None reported or observed  Judgment Poor  Confusion None  Danger to Self  Current suicidal ideation? Denies  Agreement Not to Harm Self Yes  Description of Agreement verbal  Danger to Others  Danger to Others None reported or observed  Danger to Others Abnormal  Harmful Behavior to others No threats or harm toward other people

## 2022-09-26 NOTE — BH IP Treatment Plan (Signed)
Interdisciplinary Treatment and Diagnostic Plan Update  09/26/2022 Time of Session: 9:25 AM ( UPDATE )  Sindia Gambles MRN: [id number]  Principal Diagnosis: Schizoaffective disorder, bipolar type (HCC)  Secondary Diagnoses: Principal Problem:   Schizoaffective disorder, bipolar type (HCC)   Current Medications:  Current Facility-Administered Medications  Medication Dose Route Frequency Provider Last Rate Last Admin   acetaminophen (TYLENOL) tablet 650 mg  650 mg Oral Q6H PRN Earney Navy, NP       alum & mag hydroxide-simeth (MAALOX/MYLANTA) 200-200-20 MG/5ML suspension 30 mL  30 mL Oral Q4H PRN Dahlia Byes C, NP   30 mL at 09/23/22 1219   diphenhydrAMINE (BENADRYL) capsule 50 mg  50 mg Oral TID PRN Karie Fetch, MD       Or   diphenhydrAMINE (BENADRYL) injection 50 mg  50 mg Intramuscular TID PRN Karie Fetch, MD       divalproex (DEPAKOTE ER) 24 hr tablet 1,000 mg  1,000 mg Oral Daily Karie Fetch, MD   1,000 mg at 09/26/22 3295   docusate sodium (COLACE) capsule 100 mg  100 mg Oral Daily Karie Fetch, MD   100 mg at 09/26/22 1884   hydrOXYzine (ATARAX) tablet 25 mg  25 mg Oral TID PRN Dahlia Byes C, NP   25 mg at 09/19/22 2320   insulin aspart (novoLOG) injection 0-15 Units  0-15 Units Subcutaneous TID WC Dahlia Byes C, NP   2 Units at 09/26/22 1660   LORazepam (ATIVAN) tablet 2 mg  2 mg Oral TID PRN Karie Fetch, MD       Or   LORazepam (ATIVAN) injection 2 mg  2 mg Intramuscular TID PRN Karie Fetch, MD       losartan (COZAAR) tablet 50 mg  50 mg Oral Daily Dahlia Byes C, NP   50 mg at 09/26/22 6301   magnesium hydroxide (MILK OF MAGNESIA) suspension 30 mL  30 mL Oral Daily PRN Dahlia Byes C, NP       melatonin tablet 3 mg  3 mg Oral QHS PRN Karie Fetch, MD       metFORMIN (GLUCOPHAGE-XR) 24 hr tablet 500 mg  500 mg Oral BID WC Onuoha, Josephine C, NP   500 mg at 09/26/22 0917   metoprolol succinate  (TOPROL-XL) 24 hr tablet 25 mg  25 mg Oral QHS Onuoha, Josephine C, NP   25 mg at 09/25/22 2100   OLANZapine zydis (ZYPREXA) disintegrating tablet 10 mg  10 mg Oral TID PRN Karie Fetch, MD       Or   OLANZapine (ZYPREXA) injection 10 mg  10 mg Intramuscular TID PRN Karie Fetch, MD       Melene Muller ON 09/27/2022] paliperidone (INVEGA SUSTENNA) injection 156 mg  156 mg Intramuscular Once Abbott Pao, Nadir, MD       QUEtiapine (SEROQUEL XR) 24 hr tablet 600 mg  600 mg Oral QHS Karie Fetch, MD   600 mg at 09/25/22 2100   temazepam (RESTORIL) capsule 15 mg  15 mg Oral QHS,MR X 1 Karie Fetch, MD   15 mg at 09/25/22 2100   PTA Medications: Medications Prior to Admission  Medication Sig Dispense Refill Last Dose   benztropine (COGENTIN) 1 MG tablet Take 1 tablet (1 mg total) by mouth 2 (two) times daily. 60 tablet 0    divalproex (DEPAKOTE ER) 500 MG 24 hr tablet Take 2 tablets (1,000 mg total) by mouth at bedtime for 10 days. 20 tablet 0    losartan (COZAAR) 50 MG tablet  Take 1 tablet (50 mg total) by mouth daily. 30 tablet 0    metFORMIN (GLUCOPHAGE-XR) 500 MG 24 hr tablet Take 1 tablet (500 mg total) by mouth 2 (two) times daily with a meal. 60 tablet 0    metoprolol succinate (TOPROL-XL) 25 MG 24 hr tablet Take 1 tablet (25 mg total) by mouth at bedtime. 30 tablet 0    paliperidone (INVEGA SUSTENNA) 234 MG/1.5ML injection Inject 234 mg into the muscle once for 1 dose. Administer on 12-05-2021 1.5 mL 0    QUEtiapine (SEROQUEL XR) 200 MG 24 hr tablet Take 200 mg by mouth at bedtime.       Patient Stressors:    Patient Strengths: Forensic psychologist fund of knowledge  Religious Affiliation   Treatment Modalities: Medication Management, Group therapy, Case management,  1 to 1 session with clinician, Psychoeducation, Recreational therapy.   Physician Treatment Plan for Primary Diagnosis: Schizoaffective disorder, bipolar type (HCC) Long Term Goal(s): Improvement in symptoms  so as ready for discharge   Short Term Goals: Ability to identify changes in lifestyle to reduce recurrence of condition will improve Ability to verbalize feelings will improve Ability to demonstrate self-control will improve Ability to identify and develop effective coping behaviors will improve Ability to maintain clinical measurements within normal limits will improve Compliance with prescribed medications will improve  Medication Management: Evaluate patient's response, side effects, and tolerance of medication regimen.  Therapeutic Interventions: 1 to 1 sessions, Unit Group sessions and Medication administration.  Evaluation of Outcomes: Progressing  Physician Treatment Plan for Secondary Diagnosis: Principal Problem:   Schizoaffective disorder, bipolar type (HCC)  Long Term Goal(s): Improvement in symptoms so as ready for discharge   Short Term Goals: Ability to identify changes in lifestyle to reduce recurrence of condition will improve Ability to verbalize feelings will improve Ability to demonstrate self-control will improve Ability to identify and develop effective coping behaviors will improve Ability to maintain clinical measurements within normal limits will improve Compliance with prescribed medications will improve     Medication Management: Evaluate patient's response, side effects, and tolerance of medication regimen.  Therapeutic Interventions: 1 to 1 sessions, Unit Group sessions and Medication administration.  Evaluation of Outcomes: Progressing   RN Treatment Plan for Primary Diagnosis: Schizoaffective disorder, bipolar type (HCC) Long Term Goal(s): Knowledge of disease and therapeutic regimen to maintain health will improve  Short Term Goals: Ability to remain free from injury will improve, Ability to verbalize frustration and anger appropriately will improve, Ability to participate in decision making will improve, Ability to verbalize feelings will improve,  Ability to identify and develop effective coping behaviors will improve, and Compliance with prescribed medications will improve  Medication Management: RN will administer medications as ordered by provider, will assess and evaluate patient's response and provide education to patient for prescribed medication. RN will report any adverse and/or side effects to prescribing provider.  Therapeutic Interventions: 1 on 1 counseling sessions, Psychoeducation, Medication administration, Evaluate responses to treatment, Monitor vital signs and CBGs as ordered, Perform/monitor CIWA, COWS, AIMS and Fall Risk screenings as ordered, Perform wound care treatments as ordered.  Evaluation of Outcomes: Progressing   LCSW Treatment Plan for Primary Diagnosis: Schizoaffective disorder, bipolar type (HCC) Long Term Goal(s): Safe transition to appropriate next level of care at discharge, Engage patient in therapeutic group addressing interpersonal concerns.  Short Term Goals: Engage patient in aftercare planning with referrals and resources, Increase social support, Increase emotional regulation, Facilitate acceptance of mental health diagnosis and concerns,  Identify triggers associated with mental health/substance abuse issues, and Increase skills for wellness and recovery  Therapeutic Interventions: Assess for all discharge needs, 1 to 1 time with Social worker, Explore available resources and support systems, Assess for adequacy in community support network, Educate family and significant other(s) on suicide prevention, Complete Psychosocial Assessment, Interpersonal group therapy.  Evaluation of Outcomes: Progressing   Progress in Treatment: Attending groups: No. Participating in groups: No. Taking medication as prescribed: Yes. Toleration medication: Yes. Family/Significant other contact made: Yes; Minister Wille Celeste  [phone] Patient understands diagnosis: No. Discussing patient identified  problems/goals with staff: Yes. Medical problems stabilized or resolved: Yes. Denies suicidal/homicidal ideation: Yes. Issues/concerns per patient self-inventory: No.   New problem(s) identified: No, Describe:  none reported   New Short Term/Long Term Goal(s):medication stabilization, elimination of SI thoughts, development of comprehensive mental wellness plan.      Patient Goals:  "to go home or transfer to new hospital"   Discharge Plan or Barriers: Patient recently admitted. CSW will continue to follow and assess for appropriate referrals and possible discharge planning.      Reason for Continuation of Hospitalization: Delusions  Mania Medication stabilization   Estimated Length of Stay:3-4 days  Last 3 [place] Suicide Severity Risk Score: Flowsheet Row Admission (Current) [facility] from 09/18/2022 in [facility] at [facility] from 08/04/2022 in [facility] Emergency Department at [facility]  C-SSRS RISK CATEGORY No Risk No Risk No Risk       Last PHQ 2/9 Scores:     No data to display          Scribe for Treatment Team: Beather Arbour 09/26/2022 12:04 PM

## 2022-09-26 NOTE — Group Note (Signed)
Date:  09/26/2022 Time:  9:35 PM  Group Topic/Focus:  Wrap-Up Group:   The focus of this group is to help patients review their daily goal of treatment and discuss progress on daily workbooks.    Participation Level:  Active  Participation Quality:  Appropriate and Sharing  Affect:  Appropriate  Cognitive:  Appropriate  Insight: Appropriate  Engagement in Group:  Engaged  Modes of Intervention:  Discussion and Socialization  Additional Comments:  The patient stated that she is doing "great" and that she had a "good" day. The patient stated that interactions with others has helped and makes her feel good; because she stated that she "feels ignored" sometimes. The patient rated her day a 9/10.    Kennieth Francois 09/26/2022, 9:35 PM

## 2022-09-26 NOTE — Care Management Important Message (Signed)
Medicare IM printed and given to social work for the patient.

## 2022-09-26 NOTE — Plan of Care (Signed)
  Problem: Education: Goal: Emotional status will improve Outcome: Progressing   Problem: Activity: Goal: Interest or engagement in activities will improve Outcome: Progressing   Problem: Safety: Goal: Periods of time without injury will increase Outcome: Progressing   Problem: Coping: Goal: Will verbalize feelings Outcome: Progressing

## 2022-09-26 NOTE — Progress Notes (Signed)
   09/25/22 2100  Psych Admission Type (Psych Patients Only)  Admission Status Involuntary  Psychosocial Assessment  Patient Complaints Suspiciousness;Worrying  Eye Contact Fair  Affect Financial risk analyst Activity Fidgety  Appearance/Hygiene Unremarkable  Behavior Characteristics Cooperative  Mood Suspicious;Preoccupied  Thought Process  Coherency Loose associations  Content Preoccupation;Religiosity  Delusions Grandeur;Paranoid;Religious  Perception Illusions  Hallucination None reported or observed  Judgment Poor  Confusion None  Danger to Self  Current suicidal ideation? Denies  Agreement Not to Harm Self Yes  Description of Agreement verbal  Danger to Others  Danger to Others None reported or observed  Danger to Others Abnormal  Harmful Behavior to others No threats or harm toward other people  Destructive Behavior No threats or harm toward property

## 2022-09-26 NOTE — Plan of Care (Signed)
Attempted to call Lenis Dickinson [phone] [phone]. No answer. Left HIPAA compliant voicemail.

## 2022-09-26 NOTE — Group Note (Signed)
Recreation Therapy Group Note   Group Topic:Personal Development  Group Date: 09/26/2022 Start Time: 1055 End Time: 1115 Facilitators: Ariyona Eid-McCall, LRT,CTRS Location: [address] Dayroom   Group Topic: Personal Development   Goal Area(s) Addresses:  Patient will successfully identify the benefits of gratitude. Patient will successfully identify things they are grateful for.     Intervention: Worksheet   Group Description: Patients were given a worksheet that was titled "Gratitude Log". The sheet was divided into four boxes. In the first box, patients were to identify five things they are grateful for. In the second box, patients had to identify three people that made their life happier. In the third box, patients were to identify challenges and what they learned from them. Lastly, patients were to identify their fondest memories.   Education: Geophysicist/field seismologist, Discharge Planning   Affect/Mood: N/A   Participation Level: Did not attend    Clinical Observations/Individualized Feedback:     Plan: Continue to engage patient in RT group sessions 2-3x/week.   Miloh Alcocer-McCall, LRT,CTRS  09/26/2022 1:32 PM

## 2022-09-26 NOTE — Progress Notes (Signed)
[facility] MD Progress Note  09/26/2022 10:24 AM [name] [name]  MRN:  [id number]  Principal Problem: Schizoaffective disorder, bipolar type (HCC) Diagnosis: Principal Problem:   Schizoaffective disorder, bipolar type (HCC)  Reason for Admission:  [name] [name] is a 42 y.o. female with a past psychiatric history of schizoaffective disorder bipolar type. Patient initially arrived to [facility] on 09/18/22 for foreign body in vagina (none found), agitation, delusions, and paranoia, and admitted to [facility] under IVC on 09/20/22 for crisis stabalization. PPHx is significant for schizoaffective disorder bipolar type, and no history of Suicide Attempts, Self Injurious Behavior. Patient has had 12 ED visits over the past year as well as multiple prior Psychiatric Hospitalizations (most recent [facility] 07/2021). PMHx is significant for diabetes, HTN    (admitted on 09/19/2022, total  LOS: 7 days )  Yesterday, the psychiatry team made following recommendations:  -- Continue depakote ER 1000mg  in AM for mood stabilization Depakote level for 9/21 AM -- Continue seroquel ER 600mg  QHS for mood stabilization and psychosis due to somnolence  -- Continue restoril 15mg  at bedtime and repeat x1 PRN for insomnia  --Received [name] Sustenna 234 mg IM (9/16) with plan to give second dose Saturday (9/21) -- Change agitation protocol from haldol (tied tongue) to zyprexa  Chart review: Overnight Events HR 101. BP 101/77. MAR was reviewed and patient was compliant with medications. Patient received no PRNs. Patient slept 6.25 hours. TSH 0.224. Received 4U short acting insulin.   Pertinent information discussed during bed progression:  Case was discussed in the multidisciplinary team. Patient slept 6.25 hours. Patient initially refused labs but later agreeable to obtaining CBC.    Information Obtained Today During Patient Interview: Patient evaluated on the unit. Reports sleep no issues. Reports she went to the  bathroom before going to sleep and had no issues with wetting the bed. Reports appetite no issues. Continues to state mood is "ready to go." Today, she reports her fiance is waiting to surprise her at home. She continues to be tangential with pressured and disorganized speech though her affect is calmer and she is compliant with medications and labs. Noted to have a paper at the bench on her bedside. When asked, she reports that it has her resume. 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 She continues to state that she came in due to tampon stuck inside of her and she reports that she stays in the hospital because she wanted to be a doctor. She denies side effects to currently prescribed medications. Continues to reports painful lumps in her armpit. Reports regular bowel movements. Reports last BM was yesterday.   On interview, suicidal ideations are not present . Homicidal ideations are not present.  There are paranoid ideations or delusional thought processes.   Past Psychiatric History: Current Psychiatrist: none Current Therapist: none Previous Psychiatric Diagnoses: schizoaffective disorder bipolar type Current psychiatric medications: depakote 1000 QHS, seroquel 400 QHS Psychiatric medication history/compliance: "nothing work" -per chart review, prior psychiatric medications include depakote, seroquel, restoril, ambien, risperdal, abilify, invega, geodon, trazodone, klonopin, ativan, paliperidone, received paliperidone IM 10/28, 11/07/21 Psychiatric Hospitalization hx: [name] [name] 07/2022, [facility] 10/2021, 07/2019, 06/2019, 2020x11 Psychotherapy hx: none Neuromodulation history: none  History of suicide (obtained from HPI): denies History of homicide or aggression (obtained in HPI): denies  Family Psychiatric History:  Psychiatric Dx: Drug abuse in maternal uncle Suicide Hx: unknown Violence/Aggression: "brother accused of killing  someone"  Substance use: cousin and uncle dealt with crack cocaine  Social History:  Living Situation: lives with Art therapist Education:  5 years of university, 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  Occupational hx: I'm a Psychologist, counselling. I worked for [employer], [employer], [employer]. Mr. Tama Headings takes me to work. He is my caretaker. I hire him and take care of him every month.  Marital Status: I had a husband Children: I have 2 children, don't know how old Legal:  none Military: none  Past Medical History:  Past Medical History:  Diagnosis Date   Bipolar affective disorder, currently manic, mild (HCC)    Diabetes mellitus without complication (HCC)    Schizophrenia (HCC)    Family History:  Family History  Problem Relation Age of Onset   Drug abuse Maternal Uncle     Current Medications: Current Facility-Administered Medications  Medication Dose Route Frequency Provider Last Rate Last Admin   acetaminophen (TYLENOL) tablet 650 mg  650 mg Oral Q6H PRN Dahlia Byes C, NP       alum & mag hydroxide-simeth (MAALOX/MYLANTA) 200-200-20 MG/5ML suspension 30 mL  30 mL Oral Q4H PRN Dahlia Byes C, NP   30 mL at 09/23/22 1219   diphenhydrAMINE (BENADRYL) capsule 50 mg  50 mg Oral TID PRN Karie Fetch, MD       Or   diphenhydrAMINE (BENADRYL) injection 50 mg  50 mg Intramuscular TID PRN Karie Fetch, MD       divalproex (DEPAKOTE ER) 24 hr tablet 1,000 mg  1,000 mg Oral Daily Karie Fetch, MD   1,000 mg at 09/26/22 4132   docusate sodium (COLACE) capsule 100 mg  100 mg Oral Daily Karie Fetch, MD   100 mg at 09/26/22 4401   hydrOXYzine (ATARAX) tablet 25 mg  25 mg Oral TID PRN Dahlia Byes C, NP   25 mg at 09/19/22 2320   insulin aspart (novoLOG) injection 0-15 Units  0-15 Units Subcutaneous TID WC Dahlia Byes C, NP   2 Units at 09/26/22 0272   LORazepam (ATIVAN) tablet 2 mg  2 mg Oral TID PRN Karie Fetch, MD       Or   LORazepam (ATIVAN) injection 2 mg  2 mg  Intramuscular TID PRN Karie Fetch, MD       losartan (COZAAR) tablet 50 mg  50 mg Oral Daily Dahlia Byes C, NP   50 mg at 09/26/22 5366   magnesium hydroxide (MILK OF MAGNESIA) suspension 30 mL  30 mL Oral Daily PRN Dahlia Byes C, NP       melatonin tablet 3 mg  3 mg Oral QHS PRN Karie Fetch, MD       metFORMIN (GLUCOPHAGE-XR) 24 hr tablet 500 mg  500 mg Oral BID WC Onuoha, Josephine C, NP   500 mg at 09/26/22 0917   metoprolol succinate (TOPROL-XL) 24 hr tablet 25 mg  25 mg Oral QHS Onuoha, Josephine C, NP   25 mg at 09/25/22 2100   OLANZapine zydis (ZYPREXA) disintegrating tablet 10 mg  10 mg Oral TID PRN Karie Fetch, MD       Or   OLANZapine (ZYPREXA) injection 10 mg  10 mg Intramuscular TID PRN Karie Fetch, MD       Melene Muller ON 09/27/2022] paliperidone (INVEGA SUSTENNA) injection 156 mg  156 mg Intramuscular Once Abbott Pao, Nadir, MD       QUEtiapine (SEROQUEL XR) 24 hr tablet 600 mg  600 mg Oral QHS Karie Fetch, MD   600 mg at 09/25/22 2100   temazepam (RESTORIL) capsule 15 mg  15  mg Oral QHS,MR X 1 Karie Fetch, MD   15 mg at 09/25/22 2100    Lab Results:  Results for orders placed or performed during the hospital encounter of 09/19/22 (from the past 48 hour(s))  Glucose, capillary     Status: Abnormal   Collection Time: 09/24/22 12:02 PM  Result Value Ref Range   Glucose-Capillary 107 (H) 70 - 99 mg/dL    Comment: Glucose reference range applies only to samples taken after fasting for at least 8 hours.  Glucose, capillary     Status: Abnormal   Collection Time: 09/25/22  5:58 AM  Result Value Ref Range   Glucose-Capillary 158 (H) 70 - 99 mg/dL    Comment: Glucose reference range applies only to samples taken after fasting for at least 8 hours.  Glucose, capillary     Status: Abnormal   Collection Time: 09/25/22 12:17 PM  Result Value Ref Range   Glucose-Capillary 123 (H) 70 - 99 mg/dL    Comment: Glucose reference range applies only to samples  taken after fasting for at least 8 hours.  Glucose, capillary     Status: None   Collection Time: 09/25/22  5:14 PM  Result Value Ref Range   Glucose-Capillary 82 70 - 99 mg/dL    Comment: Glucose reference range applies only to samples taken after fasting for at least 8 hours.  Glucose, capillary     Status: Abnormal   Collection Time: 09/25/22  8:52 PM  Result Value Ref Range   Glucose-Capillary 136 (H) 70 - 99 mg/dL    Comment: Glucose reference range applies only to samples taken after fasting for at least 8 hours.  Glucose, capillary     Status: Abnormal   Collection Time: 09/26/22  6:05 AM  Result Value Ref Range   Glucose-Capillary 128 (H) 70 - 99 mg/dL    Comment: Glucose reference range applies only to samples taken after fasting for at least 8 hours.  CBC with Differential/Platelet     Status: Abnormal   Collection Time: 09/26/22  6:37 AM  Result Value Ref Range   WBC 5.6 4.0 - 10.5 K/uL   RBC 3.69 (L) 3.87 - 5.11 MIL/uL   Hemoglobin 11.5 (L) 12.0 - 15.0 g/dL   HCT 91.4 (L) 78.2 - 95.6 %   MCV 94.3 80.0 - 100.0 fL   MCH 31.2 26.0 - 34.0 pg   MCHC 33.0 30.0 - 36.0 g/dL   RDW 21.3 08.6 - 57.8 %   Platelets 187 150 - 400 K/uL   nRBC 0.0 0.0 - 0.2 %   Neutrophils Relative % 49 %   Neutro Abs 2.8 1.7 - 7.7 K/uL   Lymphocytes Relative 39 %   Lymphs Abs 2.2 0.7 - 4.0 K/uL   Monocytes Relative 10 %   Monocytes Absolute 0.5 0.1 - 1.0 K/uL   Eosinophils Relative 2 %   Eosinophils Absolute 0.1 0.0 - 0.5 K/uL   Basophils Relative 0 %   Basophils Absolute 0.0 0.0 - 0.1 K/uL   Immature Granulocytes 0 %   Abs Immature Granulocytes 0.01 0.00 - 0.07 K/uL    Comment: Performed at [facility], [address] [place], [place] [address]    Blood Alcohol level:  Lab Results  Component Value Date   [facility] <10 09/18/2022   ETH <10 08/04/2022    Metabolic Labs: Lab Results  Component Value Date   HGBA1C 6.9 (H) 09/18/2022   MPG 151 09/18/2022   MPG 125.5  11/21/2021   Lab Results  Component Value Date   PROLACTIN 15.7 10/20/2021   PROLACTIN 46.5 (H) 07/25/2015   Lab Results  Component Value Date   CHOL 163 09/23/2022   TRIG 97 09/23/2022   HDL 52 09/23/2022   CHOLHDL 3.1 09/23/2022   VLDL 19 09/23/2022   LDLCALC 92 09/23/2022   LDLCALC 113 (H) 11/21/2021    Sleep:Sleep: Fair Number of Hours of Sleep: 6.25   Physical Findings: AIMS:  Facial and Oral Movements Muscles of Facial Expression: None, normal Lips and Perioral Area: None, normal Jaw: None, normal Tongue: None, normal,Extremity Movements Upper (arms, wrists, hands, fingers): None, normal Lower (legs, knees, ankles, toes): None, normal Trunk Movements: None, normal  Neck, shoulders, hips: None, normal Overall Severity Severity of abnormal movements (highest score from questions above): None, normal Incapacitation due to abnormal movements: None, normal Patient's awareness of abnormal movements (rate only patient's report): No Awareness, Dental Status Current problems with teeth and/or dentures?: No Does patient usually wear dentures?: No   No stiffness, cogwheeling, or tremors noted on exam.   CIWA:    COWS:     Psychiatric Specialty Exam:  Presentation  General Appearance: Fairly Groomed  Eye Contact:-- (intense, continued eye contact)  Speech:Pressured  Speech Volume:Normal  Handedness:Right   Mood and Affect  Mood:Anxious; Irritable  Affect:Labile   Thought Process  Thought Processes:Disorganized  Descriptions of Associations:Tangential  Orientation:Full (Time, Place and Person)  Thought Content:Perseveration; Delusions  History of Schizophrenia/Schizoaffective disorder:Yes  Duration of Psychotic Symptoms:Greater than six months  Hallucinations:Hallucinations: None  Ideas of Reference:Delusions; Percusatory  Suicidal Thoughts:Suicidal Thoughts: No  Homicidal Thoughts:Homicidal Thoughts: No   Sensorium  Memory:Immediate  Fair  Judgment:Poor  Insight:Poor   Executive Functions  Concentration:Fair  Attention Span:Poor  Recall:Poor  Fund of Knowledge:Fair  Language:Fair   Psychomotor Activity  Psychomotor Activity:Psychomotor Activity: Normal    Assets  Assets:Physical Health; Resilience; Social Support   Sleep  Sleep:Sleep: Fair Number of Hours of Sleep: 6.25   Physical Exam ROS Physical Exam Constitutional:      Appearance: the patient is not toxic-appearing. Some somnolence during daytime.  Pulmonary:     Effort: Pulmonary effort is normal.  Neurological:     General: No focal deficit present.     Mental Status: the patient is alert and oriented to person, place, and time.  MSK: No EPS or cogwheeling on exam EXT: Tender, hard <1cm nodules noted on deep palpation in bilateral armpits. No redness or drainage noted.   Review of Systems  Respiratory:  Negative for shortness of breath.   Cardiovascular:  Negative for chest pain.  Gastrointestinal:  Negative for abdominal pain, constipation, diarrhea, nausea and vomiting.  Neurological:  Negative for headaches.  EXT: positive for tenderness in bilateral armpits    Blood pressure 101/77, pulse (!) 101, temperature (!) 97.4 F (36.3 C), temperature source Oral, resp. rate 14, height 5\' 2"  (1.575 m), weight 88 kg, last menstrual period 09/11/2022, SpO2 100%, unknown if currently breastfeeding. Body mass index is 35.48 kg/m.  Treatment Plan Summary: Daily contact with patient to assess and evaluate symptoms and progress in treatment, Medication management, and Plan    ASSESSMENT: [name] [name] is a 42 y.o. female  with a past psychiatric history of schizoaffective disorder bipolar type. Patient initially arrived to [facility] on 09/18/22 for foreign body in vagina (none found), agitation, delusions, and paranoia, and admitted to [facility] under IVC on 09/20/22 for crisis stabalization. PPHx is significant for schizoaffective disorder  bipolar type,  and no history of Suicide Attempts, Self Injurious Behavior. Patient has had 12 ED visits over the past year as well as multiple prior Psychiatric Hospitalizations (most recent [facility] 07/2021). PMHx is significant for diabetes, HTN.   Patient continues to be disorganized, delusional with tangential, pressured speech though she is calmer compared to on admission. She is sleeping. Will need to call pastor to determine if patient is closer to baseline. She will receive next invega shot tomorrow and we will obtain depakote level and follow-up on T4 and T3 given low TSH. No evidence of leukocytosis on CBC, patient will need to follow-up outpatient regarding her armpit lumps.   Diagnoses / Active Problems: Schizoaffective disorder bipolar type, current episode manic   PLAN: Safety and Monitoring:  --  INVOLUNTARY admission to inpatient psychiatric unit for safety, stabilization and treatment  -- Daily contact with patient to assess and evaluate symptoms and progress in treatment  -- Patient's case to be discussed in multi-disciplinary team meeting  -- Observation Level : q15 minute checks  -- Vital signs:  q12 hours  -- Precautions: suicide, elopement, and assault  2. Psychiatric Diagnoses and Treatment:  -- Continue depakote ER 1000mg  in AM for mood stabilization  Depakote level for 9/21 AM -- Continue seroquel ER 600mg  QHS for mood stabilization and psychosis due to somnolence            -- Continue restoril 15mg  at bedtime and repeat x1 PRN for insomnia  --Received [name] Sustenna 234 mg IM (9/16) with plan to give second dose Saturday (9/21) -- Change agitation protocol from haldol (tied tongue) to zyprexa  -- The risks/benefits/side-effects/alternatives to this medication were discussed in detail with the patient and time was given for questions. The patient consents to medication trial.              -- Metabolic profile and EKG monitoring obtained while on an atypical  antipsychotic  BMI: 35.48 TSH:  Lab Results  Component Value Date   TSH 0.224 (L) 09/23/2022   Lipid Panel: LDL 92 HbgA1c: 6.9 QTc: 400              -- Encouraged patient to participate in unit milieu and in scheduled group therapies   -- Short Term Goals: Ability to identify changes in lifestyle to reduce recurrence of condition will improve, Ability to verbalize feelings will improve, Ability to demonstrate self-control will improve, Ability to identify and develop effective coping behaviors will improve, Ability to maintain clinical measurements within normal limits will improve, and Compliance with prescribed medications will improve   -- Long Term Goals: Improvement in symptoms so as ready for discharge   Other PRNS:  -- start acetaminophen 650 mg every 6 hours as needed for mild to moderate pain, fever, and headaches              -- start hydroxyzine 25 mg three times a day as needed for anxiety              -- start aluminum-magnesium hydroxide + simethicone 30 mL every 4 hours as needed for heartburn or indigestion            -- As needed agitation protocol in-place   3. Medical Issues Being Addressed:   #DM  Continue home metformin 500 BID  Continue SSI.    #HTN - Continue home metoprolol XL 25mg  at bedtime and losartan   #Prolonged Qtc, resolved -repeat EKG Qtc 400   #Constipation -start colace daily  #  Armpit nodule  Has been afebrile. No signs of infection on exam. CBC without leukocytosis.  -f/u outpatient   #Low TSH -suspect subclinical hypothyroidism. Will obtain free T4, T3.   4. Discharge Planning:   -- Social work and case management to assist with discharge planning and identification of hospital follow-up needs prior to discharge   SW working on ACT team referral  -- Estimated LOS: 5-7 days  -- Discharge Concerns: Need to establish a safety plan; Medication compliance and effectiveness  -- Discharge Goals: Return home with outpatient referrals for  mental health follow-up including medication management/psychotherapy   I certify that inpatient services furnished can reasonably be expected to improve the patient's condition.   This note was created using a voice recognition software as a result there may be grammatical errors inadvertently enclosed that do not reflect the nature of this encounter. Every attempt is made to correct such errors.   Karie Fetch, MD, PGY-2 9/20/202410:24 AM

## 2022-09-27 LAB — GLUCOSE, CAPILLARY
Glucose-Capillary: 142 mg/dL — ABNORMAL HIGH (ref 70–99)
Glucose-Capillary: 167 mg/dL — ABNORMAL HIGH (ref 70–99)
Glucose-Capillary: 112 mg/dL — ABNORMAL HIGH (ref 70–99)

## 2022-09-27 LAB — TSH: TSH: 1.818 u[IU]/mL (ref 0.350–4.500)

## 2022-09-27 LAB — VALPROIC ACID LEVEL: Valproic Acid Lvl: 92 ug/mL (ref 50.0–100.0)

## 2022-09-27 LAB — T4, FREE: Free T4: 0.71 ng/dL (ref 0.61–1.12)

## 2022-09-27 NOTE — Plan of Care (Signed)
Problem: Coping: Goal: Ability to verbalize frustrations and anger appropriately will improve Outcome: Progressing   Problem: Safety: Goal: Ability to redirect hostility and anger into socially appropriate behaviors will improve Outcome: Progressing  Pt visible in milieu majority of this shift, interacts well with peers and staff. Reports she slept well last night with good appetite. Denies SI, HI, paranoia AVH and pain when assessed. However, pt remains disorganized, tangential, delusional (grandeur) on interactions. Believes "Okey Regal is coming from [place] to marry me. The movie diary of a mad black woman is my story, it's about my life. I want to leave where I stay now, I don't think they take good care of the dogs there. I want to leave from there. My pastor bought me some sheets because my husband was unfaithful in my sheets. My skin changes a lot. My husband Ines Bloomer wanted me to be dark skin, which I did but a dark skin woman still stole him from me". Took her scheduled medications without issues. Tolerated meals and fluids well. Safety checks maintained at Q 15 minutes intervals without outburst.

## 2022-09-27 NOTE — Progress Notes (Signed)
Adult Psychoeducational Group Note  Date:  09/27/2022 Time:  8:20 PM  Group Topic/Focus:  Wrap-Up Group:   The focus of this group is to help patients review their daily goal of treatment and discuss progress on daily workbooks.  Participation Level:  Active  Participation Quality:  Appropriate  Affect:  Appropriate  Cognitive:  Appropriate  Insight: Appropriate  Engagement in Group:  Engaged  Modes of Intervention:  Discussion  Additional Comments:  Pt stated her day went well.  Pt stated her goal for the day was to talk with doctor.  Wynema Birch D 09/27/2022, 8:20 PM

## 2022-09-27 NOTE — Progress Notes (Signed)
   09/27/22 2100  Psych Admission Type (Psych Patients Only)  Admission Status Involuntary  Psychosocial Assessment  Patient Complaints Suspiciousness;Anxiety  Eye Contact Fair  Facial Expression Flat;Sad  Affect Appropriate to circumstance  Speech Tangential  Interaction Assertive  Motor Activity Fidgety  Appearance/Hygiene Unremarkable  Behavior Characteristics Cooperative;Appropriate to situation  Mood Suspicious  Thought Chartered certified accountant of ideas  Content Blaming others;Preoccupation  Delusions Paranoid  Perception Derealization;Illusions  Hallucination None reported or observed  Judgment Poor  Confusion None  Danger to Self  Current suicidal ideation? Denies  Agreement Not to Harm Self Yes  Description of Agreement verbal  Danger to Others  Danger to Others None reported or observed  Danger to Others Abnormal  Harmful Behavior to others No threats or harm toward other people  Destructive Behavior No threats or harm toward property   Pt refused her blood sugar to be checked stating  that she is not diabetic.

## 2022-09-27 NOTE — Progress Notes (Signed)
[facility] MD Progress Note  09/27/2022 7:16 AM [name] [name]  MRN:  [id number]  Principal Problem: Schizoaffective disorder, bipolar type (HCC) Diagnosis: Principal Problem:   Schizoaffective disorder, bipolar type (HCC)  Reason for Admission:  [name] [name] is a 42 y.o. female with a past psychiatric history of schizoaffective disorder bipolar type. Patient initially arrived to [facility] on 09/18/22 for foreign body in vagina (none found), agitation, delusions, and paranoia, and admitted to [facility] under IVC on 09/20/22 for crisis stabalization. PPHx is significant for schizoaffective disorder bipolar type, and no history of Suicide Attempts, Self Injurious Behavior. Patient has had 12 ED visits over the past year as well as multiple prior Psychiatric Hospitalizations (most recent [facility] 07/2021). PMHx is significant for diabetes, HTN.  (admitted on 09/19/2022, total  LOS: 8 days )  Yesterday, the psychiatry team made following recommendations:  Continue depakote ER 1000mg  in AM for mood stabilization Depakote level for 9/21 AM Continue seroquel ER 600mg  QHS for mood stabilization and psychosis due to somnolence  Continue restoril 15mg  at bedtime and repeat x1 PRN for insomnia Received [name] Sustenna 234 mg IM (9/16) with plan to give second dose Saturday (9/21)   Pertinent information discussed during bed progression:  Patient slept approximately 5 hours. Perseverative about fiance being [name] [name]. No acute events overnight.   Information Obtained Today During Patient Interview: Patient evaluated at bedside. Reports sleep is good, reports approximately 8-10 hours, it has improved since admission. Reports appetite is good, she states " I am eating awesome". States mood is "confident, satified" today. Patient believes the medications are working, she feels less depressed. Patient is bothered by a roommate who keeps telling patient she is going to hell, but reports she goes to her room  when this happens.   Reports goals for today include "I wan to keep taking my medications".   On interview, suicidal ideations are not present . Homicidal ideations are not present.   There are no auditory hallucinations, visual hallucinations, or paranoid ideations. 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  Side effects to currently prescribed medications are some increased hunger, otherwise tolerating medications. There are no somatic complaints. Reports regular bowel movements.    Past Psychiatric History: Current Psychiatrist: none Current [name]: none Previous Psychiatric Diagnoses: schizoaffective disorder bipolar type Current psychiatric medications: depakote 1000 QHS, seroquel 400 QHS Psychiatric medication history/compliance: "nothing work" -per chart review, prior psychiatric medications include depakote, seroquel, restoril, ambien, risperdal, abilify, invega, geodon, trazodone, klonopin, ativan, paliperidone, received paliperidone IM 10/28, 11/07/21 Psychiatric Hospitalization hx: [facility] 07/2022, [facility] 10/2021, 07/2019, 06/2019, 2020x11 Psychotherapy hx: none Neuromodulation history: none  History of suicide (obtained from HPI): denies History of homicide or aggression (obtained in HPI): denies  Family Psychiatric History:  Psychiatric Dx: Drug abuse in maternal uncle Suicide Hx: unknown Violence/Aggression: "brother accused of killing someone"  Substance use: cousin and uncle dealt with crack cocaine   Social History:  Living Situation: lives with [name] [name] Education:  5 years of university, 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  Occupational hx: I'm a Psychologist, counselling. I worked for [employer], [name], [employer]. [name] [name] takes me to work. He is my caretaker. I hire him and take care of him every month.  Marital Status: I had a  husband Children: I have 2 children, don't know how old Legal:  none Military:  none  Past Medical History:  Past Medical History:  Diagnosis Date   Bipolar affective disorder, currently manic, mild (HCC)    Diabetes mellitus without complication (HCC)    Schizophrenia (HCC)    Family History:  Family History  Problem Relation Age of Onset   Drug abuse Maternal Uncle     Current Medications: Current Facility-Administered Medications  Medication Dose Route Frequency Provider Last Rate Last Admin   acetaminophen (TYLENOL) tablet 650 mg  650 mg Oral Q6H PRN Dahlia Byes C, NP       alum & mag hydroxide-simeth (MAALOX/MYLANTA) 200-200-20 MG/5ML suspension 30 mL  30 mL Oral Q4H PRN Dahlia Byes C, NP   30 mL at 09/23/22 1219   diphenhydrAMINE (BENADRYL) capsule 50 mg  50 mg Oral TID PRN Karie Fetch, MD       Or   diphenhydrAMINE (BENADRYL) injection 50 mg  50 mg Intramuscular TID PRN Karie Fetch, MD       divalproex (DEPAKOTE ER) 24 hr tablet 1,000 mg  1,000 mg Oral Daily Karie Fetch, MD   1,000 mg at 09/26/22 4098   docusate sodium (COLACE) capsule 100 mg  100 mg Oral Daily Karie Fetch, MD   100 mg at 09/26/22 1191   hydrOXYzine (ATARAX) tablet 25 mg  25 mg Oral TID PRN Dahlia Byes C, NP   25 mg at 09/19/22 2320   insulin aspart (novoLOG) injection 0-15 Units  0-15 Units Subcutaneous TID WC Dahlia Byes C, NP   2 Units at 09/27/22 4782   LORazepam (ATIVAN) tablet 2 mg  2 mg Oral TID PRN Karie Fetch, MD       Or   LORazepam (ATIVAN) injection 2 mg  2 mg Intramuscular TID PRN Karie Fetch, MD       losartan (COZAAR) tablet 50 mg  50 mg Oral Daily Dahlia Byes C, NP   50 mg at 09/26/22 9562   magnesium hydroxide (MILK OF MAGNESIA) suspension 30 mL  30 mL Oral Daily PRN Dahlia Byes C, NP       melatonin tablet 3 mg  3 mg Oral QHS PRN Karie Fetch, MD       metFORMIN (GLUCOPHAGE-XR) 24 hr tablet 500 mg  500 mg Oral BID WC  Onuoha, Josephine C, NP   500 mg at 09/26/22 1700   metoprolol succinate (TOPROL-XL) 24 hr tablet 25 mg  25 mg Oral QHS Onuoha, Josephine C, NP   25 mg at 09/26/22 2106   OLANZapine zydis (ZYPREXA) disintegrating tablet 10 mg  10 mg Oral TID PRN Karie Fetch, MD       Or   OLANZapine (ZYPREXA) injection 10 mg  10 mg Intramuscular TID PRN Karie Fetch, MD       paliperidone (INVEGA SUSTENNA) injection 156 mg  156 mg Intramuscular Once Abbott Pao, Nadir, MD       QUEtiapine (SEROQUEL XR) 24 hr tablet 600 mg  600 mg Oral QHS Karie Fetch, MD   600 mg at 09/26/22 2106   temazepam (RESTORIL) capsule 15 mg  15 mg Oral QHS,MR X 1 Karie Fetch, MD   15 mg at 09/26/22 2106    Lab Results:  Results for orders placed or performed during the hospital encounter of 09/19/22 (from the past 48 hour(s))  Glucose, capillary     Status: Abnormal   Collection Time: 09/25/22 12:17 PM  Result Value Ref Range   Glucose-Capillary 123 (H) 70 - 99 mg/dL    Comment: Glucose reference range applies  only to samples taken after fasting for at least 8 hours.  Glucose, capillary     Status: None   Collection Time: 09/25/22  5:14 PM  Result Value Ref Range   Glucose-Capillary 82 70 - 99 mg/dL    Comment: Glucose reference range applies only to samples taken after fasting for at least 8 hours.  Glucose, capillary     Status: Abnormal   Collection Time: 09/25/22  8:52 PM  Result Value Ref Range   Glucose-Capillary 136 (H) 70 - 99 mg/dL    Comment: Glucose reference range applies only to samples taken after fasting for at least 8 hours.  Glucose, capillary     Status: Abnormal   Collection Time: 09/26/22  6:05 AM  Result Value Ref Range   Glucose-Capillary 128 (H) 70 - 99 mg/dL    Comment: Glucose reference range applies only to samples taken after fasting for at least 8 hours.  CBC with Differential/Platelet     Status: Abnormal   Collection Time: 09/26/22  6:37 AM  Result Value Ref Range   WBC 5.6 4.0 -  10.5 K/uL   RBC 3.69 (L) 3.87 - 5.11 MIL/uL   Hemoglobin 11.5 (L) 12.0 - 15.0 g/dL   HCT 16.1 (L) 09.6 - 04.5 %   MCV 94.3 80.0 - 100.0 fL   MCH 31.2 26.0 - 34.0 pg   MCHC 33.0 30.0 - 36.0 g/dL   RDW 40.9 81.1 - 91.4 %   Platelets 187 150 - 400 K/uL   nRBC 0.0 0.0 - 0.2 %   Neutrophils Relative % 49 %   Neutro Abs 2.8 1.7 - 7.7 K/uL   Lymphocytes Relative 39 %   Lymphs Abs 2.2 0.7 - 4.0 K/uL   Monocytes Relative 10 %   Monocytes Absolute 0.5 0.1 - 1.0 K/uL   Eosinophils Relative 2 %   Eosinophils Absolute 0.1 0.0 - 0.5 K/uL   Basophils Relative 0 %   Basophils Absolute 0.0 0.0 - 0.1 K/uL   Immature Granulocytes 0 %   Abs Immature Granulocytes 0.01 0.00 - 0.07 K/uL    Comment: Performed at [facility], [address], [place], [place] [address]  Glucose, capillary     Status: Abnormal   Collection Time: 09/26/22  5:03 PM  Result Value Ref Range   Glucose-Capillary 143 (H) 70 - 99 mg/dL    Comment: Glucose reference range applies only to samples taken after fasting for at least 8 hours.  Glucose, capillary     Status: Abnormal   Collection Time: 09/26/22  9:05 PM  Result Value Ref Range   Glucose-Capillary 141 (H) 70 - 99 mg/dL    Comment: Glucose reference range applies only to samples taken after fasting for at least 8 hours.   Comment 1 Notify RN   Glucose, capillary     Status: Abnormal   Collection Time: 09/27/22  6:09 AM  Result Value Ref Range   Glucose-Capillary 142 (H) 70 - 99 mg/dL    Comment: Glucose reference range applies only to samples taken after fasting for at least 8 hours.   Comment 1 Notify RN     Blood Alcohol level:  Lab Results  Component Value Date   ETH <10 09/18/2022   ETH <10 08/04/2022    Metabolic Labs: Lab Results  Component Value Date   HGBA1C 6.9 (H) 09/18/2022   MPG 151 09/18/2022   MPG 125.5 11/21/2021   Lab Results  Component Value Date  PROLACTIN 15.7 10/20/2021   PROLACTIN 46.5 (H) 07/25/2015   Lab  Results  Component Value Date   CHOL 163 09/23/2022   TRIG 97 09/23/2022   HDL 52 09/23/2022   CHOLHDL 3.1 09/23/2022   VLDL 19 09/23/2022   LDLCALC 92 09/23/2022   LDLCALC 113 (H) 11/21/2021    Sleep:Sleep: Fair Number of Hours of Sleep: 6.25   Physical Findings: AIMS:  Facial and Oral Movements Muscles of Facial Expression: None, normal Lips and Perioral Area: None, normal Jaw: None, normal Tongue: None, normal,Extremity Movements Upper (arms, wrists, hands, fingers): None, normal Lower (legs, knees, ankles, toes): None, normal Trunk Movements: None, normal  Neck, shoulders, hips: None, normal Overall Severity Severity of abnormal movements (highest score from questions above): None, normal Incapacitation due to abnormal movements: None, normal Patient's awareness of abnormal movements (rate only patient's report): No Awareness, Dental Status Current problems with teeth and/or dentures?: No Does patient usually wear dentures?: No   No stiffness, cogwheeling, or tremors noted on exam.   CIWA:    COWS:     Psychiatric Specialty Exam:  Presentation  General Appearance: Well Groomed  Eye Contact:-- (intense, inappropriate, continued eye contact)  Speech:Pressured  Speech Volume:Normal  Handedness:Right   Mood and Affect  Mood:"You need to speak with my fiance"  Affect:Less labile, impaired mood reactvitiy   Thought Process  Thought Processes:Disorganized  Descriptions of Associations:Tangential  Orientation:Full (Time, Place and Person)  Thought Content:Perseveration; Delusions  History of Schizophrenia/Schizoaffective disorder:Yes  Duration of Psychotic Symptoms:Greater than six months  Hallucinations:Hallucinations: None  Ideas of Reference:Delusions; Percusatory  Suicidal Thoughts:Suicidal Thoughts: No  Homicidal Thoughts:Homicidal Thoughts: No   Sensorium  Memory:Immediate Fair  Judgment:Poor  Insight:Poor   Executive Functions   Concentration:Fair  Attention Span:Poor  Recall:Poor  Fund of Knowledge:Fair  Language:Fair   Psychomotor Activity  Psychomotor Activity:Psychomotor Activity: Normal    Assets  Assets:Physical Health; Resilience; Social Support   Sleep  Sleep:Fair  Physical Exam Constitutional:      Appearance: the patient is not toxic-appearing. Some somnolence during daytime.  Pulmonary:     Effort: Pulmonary effort is normal.  Neurological:     General: No focal deficit present.     Mental Status: the patient is alert and oriented to person, place, and time.  MSK: No EPS or cogwheeling on exam EXT: Tender, hard <1cm nodules noted on deep palpation in bilateral armpits. No redness or drainage noted.   Review of Systems  Respiratory:  Negative for shortness of breath.   Cardiovascular:  Negative for chest pain.  Gastrointestinal:  Negative for abdominal pain, constipation, diarrhea, nausea and vomiting.  Neurological:  Negative for headaches.  EXT: positive for tenderness in bilateral armpits    Blood pressure 91/70, pulse (!) 106, temperature 98.1 F (36.7 C), temperature source Oral, resp. rate 14, height 5\' 2"  (1.575 m), weight 88 kg, last menstrual period 09/11/2022, SpO2 99%, unknown if currently breastfeeding. Body mass index is 35.48 kg/m.  Treatment Plan Summary: Daily contact with patient to assess and evaluate symptoms and progress in treatment, Medication management, and Plan    ASSESSMENT: Luticia Luckey is a 42 y.o. female  with a past psychiatric history of schizoaffective disorder bipolar type. Patient initially arrived to [facility] on 09/18/22 for foreign body in vagina (none found), agitation, delusions, and paranoia, and admitted to [facility] under IVC on 09/20/22 for crisis stabalization. PPHx is significant for schizoaffective disorder bipolar type, and no history of Suicide Attempts, Self Injurious Behavior. Patient has had 12  ED visits over the past year as well as  multiple prior Psychiatric Hospitalizations (most recent [facility] 07/2021). PMHx is significant for diabetes, HTN.   Patient's delusions appear at baseline, she is adamant she is engaged to [name] [name] today. Has remained cooperative on the unit. Patient receiving 2nd loading dose of invega on 9/21.    Diagnoses / Active Problems: Schizoaffective disorder bipolar type, current episode manic   PLAN: Safety and Monitoring:  --  INVOLUNTARY admission to inpatient psychiatric unit for safety, stabilization and treatment  -- Daily contact with patient to assess and evaluate symptoms and progress in treatment  -- Patient's case to be discussed in multi-disciplinary team meeting  -- Observation Level : q15 minute checks  -- Vital signs:  q12 hours  -- Precautions: suicide, elopement, and assault  2. Psychiatric Diagnoses and Treatment:  Continue depakote ER 1000mg  in AM for mood stabilization VPA level 92 on 09/27/2022 Continue seroquel ER 600mg  QHS for mood stabilization and psychosis due to somnolence Continue restoril 15mg  at bedtime and repeat x1 PRN for insomnia Received [name] Sustenna 234 mg IM (9/16) with plan to give second dose Saturday (9/21)   -- The risks/benefits/side-effects/alternatives to this medication were discussed in detail with the patient and time was given for questions. The patient consents to medication trial.              -- Metabolic profile and EKG monitoring obtained while on an atypical antipsychotic  BMI: 35.48 TSH:  Lab Results  Component Value Date   TSH 0.224 (L) 09/23/2022   Lipid Panel: LDL 92 HbgA1c: 6.9 QTc: 400              -- Encouraged patient to participate in unit milieu and in scheduled group therapies   -- Short Term Goals: Ability to identify changes in lifestyle to reduce recurrence of condition will improve, Ability to verbalize feelings will improve, Ability to demonstrate self-control will improve, Ability to identify and develop  effective coping behaviors will improve, Ability to maintain clinical measurements within normal limits will improve, and Compliance with prescribed medications will improve   -- Long Term Goals: Improvement in symptoms so as ready for discharge   Other PRNS:  -- start acetaminophen 650 mg every 6 hours as needed for mild to moderate pain, fever, and headaches              -- start hydroxyzine 25 mg three times a day as needed for anxiety              -- start aluminum-magnesium hydroxide + simethicone 30 mL every 4 hours as needed for heartburn or indigestion            -- As needed agitation protocol in-place   3. Medical Issues Being Addressed:   #DM  Continue home metformin 500 BID  Continue SSI.    #HTN  - Continue home metoprolol XL 25mg  at bedtime and losartan   #Prolonged Qtc, resolved -repeat EKG Qtc 400   #Constipation  -colace daily  #Armpit nodule   Has been afebrile. No signs of infection on exam. CBC without leukocytosis.  -f/u outpatient   #Low TSH -suspect subclinical hypothyroidism. Will obtain free T4, T3.  - repeat TSH WNL  4. Discharge Planning:   -- Social work and case management to assist with discharge planning and identification of hospital follow-up needs prior to discharge   SW working on ACT team referral  -- Estimated LOS: 09/29/2022  --  Discharge Concerns: Need to establish a safety plan; Medication compliance and effectiveness  -- Discharge Goals: Return home with outpatient referrals for mental health follow-up including medication management/psychotherapy   I certify that inpatient services furnished can reasonably be expected to improve the patient's condition.   This note was created using a voice recognition software as a result there may be grammatical errors inadvertently enclosed that do not reflect the nature of this encounter. Every attempt is made to correct such errors.   Dr. Liston Alba, MD PGY-2, Psychiatry Residency   9/21/20247:16 AM

## 2022-09-28 DIAGNOSIS — F250 Schizoaffective disorder, bipolar type: Secondary | ICD-10-CM | POA: Diagnosis not present

## 2022-09-28 LAB — GLUCOSE, CAPILLARY
Glucose-Capillary: 133 mg/dL — ABNORMAL HIGH (ref 70–99)
Glucose-Capillary: 112 mg/dL — ABNORMAL HIGH (ref 70–99)
Glucose-Capillary: 133 mg/dL — ABNORMAL HIGH (ref 70–99)
Glucose-Capillary: 87 mg/dL (ref 70–99)

## 2022-09-28 NOTE — [facility] Group Notes (Signed)
Adult Psychoeducational Group Note  Date:  09/28/2022 Time:  8:29 PM  Group Topic/Focus:  Wrap-Up Group:   The focus of this group is to help patients review their daily goal of treatment and discuss progress on daily workbooks.  Participation Level:  Active  Participation Quality:  Redirectable  Affect:  Excited  Cognitive:  Disorganized  Insight: Good  Engagement in Group:  Distracting and Off Topic  Modes of Intervention:  Discussion  Additional Comments:   Pt states that she had a great day and spoke with her fiance and her priest on the phone. Pt states that she has been focused on self care today. Pt was extremely tangential and off topic during group discussion  [name] [name] 09/28/2022, 8:29 PM
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
## 2022-09-28 NOTE — [facility] Group Notes (Signed)
[facility] Group Notes:  (Nursing/MHT/Case Management/Adjunct)  Date:  09/28/2022  Time:  2:26 PM  Type of Therapy:  Psychoeducational Skills  Participation Level:  Did Not Attend  Participation Quality:  na  Affect:  na  Cognitive:  na  Insight:  None  Engagement in Group:  na  Modes of Intervention:  Discussion, Exploration, Education  Summary of Progress/Problems: Mental health wellness podcast played from [place], regarding tips for promoting mental well being and healthy relationships. Pt did not attend.

## 2022-09-28 NOTE — Plan of Care (Signed)

## 2022-09-28 NOTE — Progress Notes (Signed)
[facility] MD Progress Note  09/28/2022 7:29 AM [name]  MRN:  [id number]  Principal Problem: Schizoaffective disorder, bipolar type (HCC) Diagnosis: Principal Problem:   Schizoaffective disorder, bipolar type (HCC)  Reason for Admission:  [name] [name] is a 42 y.o. female with a past psychiatric history of schizoaffective disorder bipolar type. Patient initially arrived to [facility] on 09/18/22 for foreign body in vagina (none found), agitation, delusions, and paranoia, and admitted to [facility] under IVC on 09/20/22 for crisis stabalization. PPHx is significant for schizoaffective disorder bipolar type, and no history of Suicide Attempts, Self Injurious Behavior. Patient has had 12 ED visits over the past year as well as multiple prior Psychiatric Hospitalizations (most recent [facility] 07/2021). PMHx is significant for diabetes, HTN.  (admitted on 09/19/2022, total  LOS: 9 days )  Yesterday, the psychiatry team made following recommendations:  Continue depakote ER 1000mg  in AM for mood stabilization VPA level 92 on 09/27/2022 Continue seroquel ER 600mg  QHS for mood stabilization and psychosis due to somnolence Continue restoril 15mg  at bedtime and repeat x1 PRN for insomnia Received [name] Sustenna 234 mg IM (9/16) with plan to give second dose Saturday (9/21)    Pertinent information discussed during bed progression:  Slept 9.5 hours. Remains tangential and delusional.   Information Obtained Today During Patient Interview: Patient evaluated on the unit.  Patient reported she was on the phone with fiance, [name] [name].  Reports sleep is good, improved after stopping Restoril, denies nightmares. Reports appetite is improved. States mood is "happier, praise God" today. Patient describes feeling grateful that she is on medications.   Patient starts talking about how Okey Regal proposed to her 10 years and how she initially turned. Patient says she  and [name] [name] are planning of having  children.   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   On interview, suicidal ideations are not present . Homicidal ideations are not present.   There are no auditory hallucinations or visual hallucinations. Denies paranoid ideations, states "  Side effects to currently prescribed medications are none. There are no somatic complaints. Reports regular bowel movements.    Attempted to contact patient's pastor, Christin Bach, at [phone] on 09/28/22 at 11:13 AM --no answer, left a HIPAA compliant voicemail.  Contacted patient's reported fiance, [name] [name], at [phone] at 11:05 AM (patient provided verbal consent to speak with them) [place] lives in [place], [place]. Patient wants to live with [name] [name], they previously lived together but fiance wasn't able to provide her with a home.  Reports he has known patient for a year, and got engaged for the last 4-5 months. Last saw patient a couple of weeks ago.  At baseline, [name] [name] describes patient as "a wonderful lady" quiet, shy, and communicates a lot. She is very preoccupied with her faith and loves going to church. Reports the relationship has been going well, but has noticed patient will forget things she said. 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 [name] [name] confirms they have plans of having children.    Past Psychiatric History: Current Psychiatrist: none Current [name]: none Previous Psychiatric Diagnoses: schizoaffective disorder bipolar type Current psychiatric medications: depakote 1000 QHS, seroquel 400 QHS Psychiatric medication history/compliance: "nothing work" -per chart review, prior psychiatric medications include depakote,  seroquel, restoril, ambien, risperdal,  abilify, invega, geodon, trazodone, klonopin, ativan, paliperidone, received paliperidone IM 10/28, 11/07/21 Psychiatric Hospitalization hx: [facility] 07/2022, [facility] 10/2021, 07/2019, 06/2019, 2020x11 Psychotherapy hx: none Neuromodulation history: none  History of suicide (obtained from HPI): denies History of homicide or aggression (obtained in HPI): denies  Family Psychiatric History:  Psychiatric Dx: Drug abuse in maternal uncle Suicide Hx: unknown Violence/Aggression: "brother accused of killing someone"  Substance use: cousin and uncle dealt with crack cocaine   Social History:  Living Situation: lives with [name] [name] Education:  5 years of university, 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  Occupational hx: I'm a Psychologist, counselling. I worked for [employer], [employer], [employer]. Mr. Tama Headings takes me to work. He is my caretaker. I hire him and take care of him every month.  Marital Status: I had a husband Children: I have 2 children, don't know how old Legal:  none Military: none  Past Medical History:  Past Medical History:  Diagnosis Date   Bipolar affective disorder, currently manic, mild (HCC)    Diabetes mellitus without complication (HCC)    Schizophrenia (HCC)    Family History:  Family History  Problem Relation Age of Onset   Drug abuse Maternal Uncle     Current Medications: Current Facility-Administered Medications  Medication Dose Route Frequency Provider Last Rate Last Admin   acetaminophen (TYLENOL) tablet 650 mg  650 mg Oral Q6H PRN Dahlia Byes C, NP       alum & mag hydroxide-simeth (MAALOX/MYLANTA) 200-200-20 MG/5ML suspension 30 mL  30 mL Oral Q4H PRN Dahlia Byes C, NP   30 mL at 09/23/22 1219   diphenhydrAMINE (BENADRYL) capsule 50 mg  50 mg Oral TID PRN Karie Fetch, MD       Or   diphenhydrAMINE (BENADRYL) injection 50 mg  50 mg Intramuscular TID PRN Karie Fetch, MD       divalproex (DEPAKOTE ER) 24 hr tablet 1,000 mg  1,000 mg Oral Daily Karie Fetch, MD    1,000 mg at 09/27/22 0819   docusate sodium (COLACE) capsule 100 mg  100 mg Oral Daily Karie Fetch, MD   100 mg at 09/27/22 0819   hydrOXYzine (ATARAX) tablet 25 mg  25 mg Oral TID PRN Dahlia Byes C, NP   25 mg at 09/19/22 2320   insulin aspart (novoLOG) injection 0-15 Units  0-15 Units Subcutaneous TID WC Dahlia Byes C, NP   2 Units at 09/28/22 0651   LORazepam (ATIVAN) tablet 2 mg  2 mg Oral TID PRN Karie Fetch, MD       Or   LORazepam (ATIVAN) injection 2 mg  2 mg Intramuscular TID PRN Karie Fetch, MD       losartan (COZAAR) tablet 50 mg  50 mg Oral Daily Dahlia Byes C, NP   50 mg at 09/27/22 4098   magnesium hydroxide (MILK OF MAGNESIA) suspension 30 mL  30 mL Oral Daily PRN Dahlia Byes C, NP       melatonin tablet 3 mg  3 mg Oral QHS PRN Karie Fetch, MD       metFORMIN (GLUCOPHAGE-XR) 24 hr tablet 500 mg  500 mg Oral BID WC Onuoha, Josephine C, NP   500 mg at 09/27/22 1648   metoprolol succinate (TOPROL-XL) 24 hr tablet 25 mg  25 mg Oral QHS Onuoha, Josephine C, NP   25 mg at 09/27/22 2048   OLANZapine zydis (ZYPREXA) disintegrating tablet 10 mg  10 mg Oral TID PRN Karie Fetch, MD  Or   OLANZapine (ZYPREXA) injection 10 mg  10 mg Intramuscular TID PRN Karie Fetch, MD       QUEtiapine (SEROQUEL XR) 24 hr tablet 600 mg  600 mg Oral QHS Karie Fetch, MD   600 mg at 09/27/22 2048   temazepam (RESTORIL) capsule 15 mg  15 mg Oral QHS,MR X 1 Karie Fetch, MD   15 mg at 09/27/22 2048    Lab Results:  Results for orders placed or performed during the hospital encounter of 09/19/22 (from the past 48 hour(s))  Glucose, capillary     Status: Abnormal   Collection Time: 09/26/22  5:03 PM  Result Value Ref Range   Glucose-Capillary 143 (H) 70 - 99 mg/dL    Comment: Glucose reference range applies only to samples taken after fasting for at least 8 hours.  Glucose, capillary     Status: Abnormal   Collection Time: 09/26/22  9:05 PM   Result Value Ref Range   Glucose-Capillary 141 (H) 70 - 99 mg/dL    Comment: Glucose reference range applies only to samples taken after fasting for at least 8 hours.   Comment 1 Notify RN   Glucose, capillary     Status: Abnormal   Collection Time: 09/27/22  6:09 AM  Result Value Ref Range   Glucose-Capillary 142 (H) 70 - 99 mg/dL    Comment: Glucose reference range applies only to samples taken after fasting for at least 8 hours.   Comment 1 Notify RN   Valproic acid level     Status: None   Collection Time: 09/27/22  6:31 AM  Result Value Ref Range   Valproic Acid Lvl 92 50.0 - 100.0 ug/mL    Comment: Performed at [facility], [address] [place], [place] [address]  T4, free     Status: None   Collection Time: 09/27/22  6:31 AM  Result Value Ref Range   Free T4 0.71 0.61 - 1.12 ng/dL    Comment: (NOTE) Biotin ingestion may interfere with free T4 tests. If the results are inconsistent with the TSH level, previous test results, or the clinical presentation, then consider biotin interference. If needed, order repeat testing after stopping biotin. Performed at [facility], [address] [place], [place] [address]   TSH     Status: None   Collection Time: 09/27/22  6:31 AM  Result Value Ref Range   TSH 1.818 0.350 - 4.500 uIU/mL    Comment: Performed by a 3rd Generation assay with a functional sensitivity of <=0.01 uIU/mL. Performed at [facility], [address] [place], [place] [address]   Glucose, capillary     Status: Abnormal   Collection Time: 09/27/22 12:02 PM  Result Value Ref Range   Glucose-Capillary 167 (H) 70 - 99 mg/dL    Comment: Glucose reference range applies only to samples taken after fasting for at least 8 hours.   Comment 1 Notify RN    Comment 2 Document in Chart   Glucose, capillary     Status: Abnormal   Collection Time: 09/27/22  4:52 PM  Result Value Ref Range   Glucose-Capillary 112 (H) 70 - 99  mg/dL    Comment: Glucose reference range applies only to samples taken after fasting for at least 8 hours.  Glucose, capillary     Status: Abnormal   Collection Time: 09/28/22  6:48 AM  Result Value Ref Range   Glucose-Capillary 133 (H) 70 - 99 mg/dL  Comment: Glucose reference range applies only to samples taken after fasting for at least 8 hours.    Blood Alcohol level:  Lab Results  Component Value Date   ETH <10 09/18/2022   ETH <10 08/04/2022    Metabolic Labs: Lab Results  Component Value Date   HGBA1C 6.9 (H) 09/18/2022   MPG 151 09/18/2022   MPG 125.5 11/21/2021   Lab Results  Component Value Date   PROLACTIN 15.7 10/20/2021   PROLACTIN 46.5 (H) 07/25/2015   Lab Results  Component Value Date   CHOL 163 09/23/2022   TRIG 97 09/23/2022   HDL 52 09/23/2022   CHOLHDL 3.1 09/23/2022   VLDL 19 09/23/2022   LDLCALC 92 09/23/2022   LDLCALC 113 (H) 11/21/2021    Sleep:No data recorded   Physical Findings: AIMS:  Facial and Oral Movements Muscles of Facial Expression: None, normal Lips and Perioral Area: None, normal Jaw: None, normal Tongue: None, normal,Extremity Movements Upper (arms, wrists, hands, fingers): None, normal Lower (legs, knees, ankles, toes): None, normal Trunk Movements: None, normal  Neck, shoulders, hips: None, normal Overall Severity Severity of abnormal movements (highest score from questions above): None, normal Incapacitation due to abnormal movements: None, normal Patient's awareness of abnormal movements (rate only patient's report): No Awareness, Dental Status Current problems with teeth and/or dentures?: No Does patient usually wear dentures?: No   No stiffness, cogwheeling, or tremors noted on exam.   CIWA:    COWS:     Psychiatric Specialty Exam:  Presentation  General Appearance: Well Groomed  Eye Contact:-- (intense, inappropriate, continued eye contact)  Speech:Pressured  Speech Volume:Normal  Handedness:Not  assessed   Mood and Affect  Mood:"I am ready to leave"  Affect:Flat   Thought Process  Thought Processes:Disorganized  Descriptions of Associations:Tangential  Orientation:Full (Time, Place and Person)  Thought Content:Perseveration; Delusions  History of Schizophrenia/Schizoaffective disorder:Yes  Duration of Psychotic Symptoms:Greater than six months  Hallucinations:Denies  Ideas of Reference:Delusions; Percusatory  Suicidal Thoughts:Denies  Homicidal Thoughts:Denies   Sensorium  Memory:Immediate Fair  Judgment:Poor  Insight:Poor   Executive Functions  Concentration:Fair  Attention Span:Poor  Recall:Poor  Fund of Knowledge:Fair  Language:Fair   Psychomotor Activity  Psychomotor Activity:Overall normal, some restlessness noted    Assets  Assets:Physical Health; Resilience; Social Support   Sleep  Sleep:Fair  Physical Exam Constitutional:      Appearance: the patient is not toxic-appearing. Some somnolence during daytime.  Pulmonary:     Effort: Pulmonary effort is normal.  Neurological:     General: No focal deficit present.     Mental Status: the patient is alert and oriented to person, place, and time.  MSK: No EPS or cogwheeling on exam EXT: Tender, hard <1cm nodules noted on deep palpation in bilateral armpits. No redness or drainage noted.   Review of Systems  Respiratory:  Negative for shortness of breath.   Cardiovascular:  Negative for chest pain.  Gastrointestinal:  Negative for abdominal pain, constipation, diarrhea, nausea and vomiting.  Neurological:  Negative for headaches.  EXT: positive for tenderness in bilateral armpits    Blood pressure (!) 108/44, pulse (!) 107, temperature 97.7 F (36.5 C), temperature source Oral, resp. rate 14, height 5\' 2"  (1.575 m), weight 88 kg, last menstrual period 09/11/2022, SpO2 97%, unknown if currently breastfeeding. Body mass index is 35.48 kg/m.  Treatment Plan Summary: Daily  contact with patient to assess and evaluate symptoms and progress in treatment, Medication management, and Plan    ASSESSMENT: [name] [name] is a  42 y.o. female  with a past psychiatric history of schizoaffective disorder bipolar type. Patient initially arrived to [facility] on 09/18/22 for foreign body in vagina (none found), agitation, delusions, and paranoia, and admitted to [facility] under IVC on 09/20/22 for crisis stabalization. PPHx is significant for schizoaffective disorder bipolar type, and no history of Suicide Attempts, Self Injurious Behavior. Patient has had 12 ED visits over the past year as well as multiple prior Psychiatric Hospitalizations (most recent [facility] 07/2021). PMHx is significant for diabetes, HTN.   Delusions appear unchanged, spoke with patient's reported fianc, [name] [name], who confirms patient's behaviors is.  [name] [name] and Mina both want to live together, both requests that patient be discharged to [facility].  Have been asked successful in speaking with patient's pastor, have called multiple times during the weekend.    Diagnoses / Active Problems: Schizoaffective disorder bipolar type, current episode manic   PLAN: Safety and Monitoring:  --  INVOLUNTARY admission to inpatient psychiatric unit for safety, stabilization and treatment  -- Daily contact with patient to assess and evaluate symptoms and progress in treatment  -- Patient's case to be discussed in multi-disciplinary team meeting  -- Observation Level : q15 minute checks  -- Vital signs:  q12 hours  -- Precautions: suicide, elopement, and assault  2. Psychiatric Diagnoses and Treatment:  Continue depakote ER 1000mg  in AM for mood stabilization  VPA level 92, therapeutic, on 09/27/2022  Continue seroquel ER 600mg  QHS for mood stabilization and psychosis due to somnolence Continue restoril 15mg  at bedtime and repeat x1 PRN for insomnia Received [name] Sustenna 234 mg IM (9/16) and Sustenna  156 mg IM Saturday (9/21)   -- The risks/benefits/side-effects/alternatives to this medication were discussed in detail with the patient and time was given for questions. The patient consents to medication trial.              -- Metabolic profile and EKG monitoring obtained while on an atypical antipsychotic  BMI: 35.48 TSH:  Lab Results  Component Value Date   TSH 1.818 09/27/2022   Lipid Panel: LDL 92 HbgA1c: 6.9 QTc: 400              -- Encouraged patient to participate in unit milieu and in scheduled group therapies   -- Short Term Goals: Ability to identify changes in lifestyle to reduce recurrence of condition will improve, Ability to verbalize feelings will improve, Ability to demonstrate self-control will improve, Ability to identify and develop effective coping behaviors will improve, Ability to maintain clinical measurements within normal limits will improve, and Compliance with prescribed medications will improve   -- Long Term Goals: Improvement in symptoms so as ready for discharge   Other PRNS:  -- start acetaminophen 650 mg every 6 hours as needed for mild to moderate pain, fever, and headaches              -- start hydroxyzine 25 mg three times a day as needed for anxiety              -- start aluminum-magnesium hydroxide + simethicone 30 mL every 4 hours as needed for heartburn or indigestion            -- As needed agitation protocol in-place   3. Medical Issues Being Addressed:   #DM  Continue home metformin 500 BID  Continue SSI.    #HTN  - Continue home metoprolol XL 25mg  at bedtime and losartan   #Prolonged Qtc, resolved -repeat EKG Qtc  400   #Constipation  -colace daily  #Armpit nodule   Has been afebrile. No signs of infection on exam. CBC without leukocytosis.  -f/u outpatient   #Low TSH--RESOLVED  - repeat TSH WNL  4. Discharge Planning:   -- Social work and case management to assist with discharge planning and identification of hospital  follow-up needs prior to discharge   SW working on ACT team referral  -- Estimated LOS: 09/30/2022  -- Discharge Concerns: Need to establish a safety plan; Medication compliance and effectiveness  -- Discharge Goals: Return home with outpatient referrals for mental health follow-up including medication management/psychotherapy   I certify that inpatient services furnished can reasonably be expected to improve the patient's condition.   This note was created using a voice recognition software as a result there may be grammatical errors inadvertently enclosed that do not reflect the nature of this encounter. Every attempt is made to correct such errors.   Dr. Liston Alba, MD PGY-2, Psychiatry Residency  9/22/20247:29 AM

## 2022-09-29 DIAGNOSIS — F250 Schizoaffective disorder, bipolar type: Secondary | ICD-10-CM | POA: Diagnosis not present

## 2022-09-29 LAB — GLUCOSE, CAPILLARY: Glucose-Capillary: 118 mg/dL — ABNORMAL HIGH (ref 70–99)

## 2022-09-29 MED ORDER — QUETIAPINE FUMARATE ER 300 MG PO TB24: 600.0000 mg | ORAL_TABLET | Freq: Every day | ORAL | 0 refills | Status: DC

## 2022-09-29 MED ORDER — METFORMIN HCL ER 500 MG PO TB24: 500.0000 mg | ORAL_TABLET | Freq: Two times a day (BID) | ORAL | 0 refills | Status: DC

## 2022-09-29 MED ORDER — METOPROLOL SUCCINATE ER 25 MG PO TB24: 25.0000 mg | ORAL_TABLET | Freq: Every day | ORAL | 0 refills | Status: AC

## 2022-09-29 MED ORDER — TEMAZEPAM 15 MG PO CAPS: 15.0000 mg | ORAL_CAPSULE | Freq: Every evening | ORAL | 0 refills | Status: DC | PRN

## 2022-09-29 MED ORDER — HYDROXYZINE HCL 25 MG PO TABS: 25.0000 mg | ORAL_TABLET | Freq: Three times a day (TID) | ORAL | 0 refills | Status: AC | PRN

## 2022-09-29 MED ORDER — DIVALPROEX SODIUM ER 500 MG PO TB24: 1000.0000 mg | ORAL_TABLET | Freq: Every day | ORAL | 0 refills | Status: DC

## 2022-09-29 MED ORDER — PALIPERIDONE PALMITATE ER 234 MG/1.5ML IM SUSY: 234.0000 mg | PREFILLED_SYRINGE | Freq: Once | INTRAMUSCULAR | 0 refills | Status: DC

## 2022-09-29 MED ORDER — LOSARTAN POTASSIUM 50 MG PO TABS: 50.0000 mg | ORAL_TABLET | Freq: Every day | ORAL | 0 refills | Status: AC

## 2022-09-29 NOTE — Discharge Summary (Signed)
Physician Discharge Summary Note  Patient:  [name] [name] is an 42 y.o., female MRN:  [id number] DOB:  [birth year] Patient phone:  [phone] (home)  Patient address:   [address] [place] [place] [place] [address],  Total Time spent with patient: 30 minutes  Date of Admission:  09/19/2022 Date of Discharge: 09/29/22  Reason for Admission:  [name] [name] is a 42 y.o. female  with a past psychiatric history of schizoaffective disorder bipolar type. Patient initially arrived to [facility] on 09/18/22 for foreign body in vagina (none found), agitation, delusions, and paranoia, and admitted to [facility] under IVC on 09/20/22 for crisis stabalization. PPHx is significant for schizoaffective disorder bipolar type, and no history of Suicide Attempts, Self Injurious Behavior. Patient has had 12 ED visits over the past year as well as multiple prior Psychiatric Hospitalizations (most recent [facility] 07/2021). PMHx is significant for diabetes, HTN.   Principal Problem: Schizoaffective disorder, bipolar type [facility]) Discharge Diagnoses: Principal Problem:   Schizoaffective disorder, bipolar type (HCC)  Past Psychiatric History:  Current Psychiatrist: none Current Therapist: none Previous Psychiatric Diagnoses: schizoaffective disorder bipolar type Current psychiatric medications: depakote 1000 QHS, seroquel 400 QHS Psychiatric medication history/compliance: "nothing work" -per chart review, prior psychiatric medications include depakote, seroquel, restoril, ambien, risperdal, abilify, invega, geodon, trazodone, klonopin, ativan, paliperidone, received paliperidone IM 10/28, 11/07/21 Psychiatric Hospitalization hx: [facility] 10/2021, 07/2019, 06/2019, 2020x11 Psychotherapy hx: none Neuromodulation history: none  History of suicide (obtained from HPI): denies History of homicide or aggression (obtained in HPI): denies  Past Medical History:  Past Medical History:  Diagnosis Date   Bipolar affective  disorder, currently manic, mild (HCC)    Diabetes mellitus without complication (HCC)    Schizophrenia (HCC)     Past Surgical History:  Procedure Laterality Date   CESAREAN SECTION  04/2018   WISDOM TOOTH EXTRACTION     Family History:  Family History  Problem Relation Age of Onset   Drug abuse Maternal Uncle    Family Psychiatric  History:  Psychiatric Dx: Drug abuse in maternal uncle Suicide Hx: unknown Violence/Aggression: "brother accused of killing someone"  Substance use: cousin and uncle dealt with crack cocaine   Social History:  Social History   Substance and Sexual Activity  Alcohol Use Not Currently     Social History   Substance and Sexual Activity  Drug Use Not Currently    Social History   Socioeconomic History   Marital status: Legally Separated    Spouse name: Not on file   Number of children: Not on file   Years of education: Not on file   Highest education level: Not on file  Occupational History   Not on file  Tobacco Use   Smoking status: Never   Smokeless tobacco: Never  Vaping Use   Vaping status: Never Used  Substance and Sexual Activity   Alcohol use: Not Currently   Drug use: Not Currently   Sexual activity: Not Currently  Other Topics Concern   Not on file  Social History Narrative   ** Merged History Encounter **       ** Merged History Encounter **       Social Determinants of Health   Financial Resource Strain: Not on file  Food Insecurity: Patient Unable To Answer (09/19/2022)   Hunger Vital Sign    Worried About Running Out of Food in the Last Year: Patient unable to answer    Ran Out of Food in the Last Year: Patient unable to answer  Transportation Needs: Patient Declined (09/29/2022)   PRAPARE - Administrator, Civil Service (Medical): Patient declined    Lack of Transportation (Non-Medical): Patient declined  Recent Concern: Transportation Needs - Unmet Transportation Needs (09/29/2022)   PRAPARE -  Administrator, Civil Service (Medical): Yes    Lack of Transportation (Non-Medical): Yes  Physical Activity: Not on file  Stress: Not on file  Social Connections: Not on file    Hospital Course:    During the patient's hospitalization, patient had extensive initial psychiatric evaluation, and follow-up psychiatric evaluations every day.   Psychiatric diagnoses provided upon initial assessment:  - Schizoaffective disorder bipolar type, current episode manic    Patient's psychiatric medications were adjusted on admission:     -- Start depakote ER 1000mg  at bedtime     -- Start seroquel 400mg  at bedtime     -- Start restoril 15mg  at bedtime and repeat x1 PRN    -- Change agitation protocol from geodon, zyprexa to haldol 5, ativan 2, benadryl 50   During the hospitalization, other adjustments were made to the patient's psychiatric medication regimen:   -- Depakote ER 1000mg  changed to morning without noted somnolence, due to increased seroquel at bedtime and for mood stabilization during the day             - VPA level 92, therapeutic, on 09/27/2022   -- Seroquel increased to 600mg  at bedtime for mood stability, psychosis and insomnia   -- Continued restoril 15mg  at bedtime due to insomnia   -- Received [name] Sustenna 234 mg IM (9/16) and Sustenna 156 mg IM Saturday (9/21) for psychosis and delusions   Next dose of invega sustenna due 10/27/22 - Changed agitation protocol from haldol (tied tongue) to zyprexa    Patient's care was discussed during the interdisciplinary team meeting every day during the hospitalization.   The patient denied having side effects to prescribed psychiatric medication.   Gradually, patient started adjusting to milieu. The patient was evaluated each day by a clinical provider to ascertain response to treatment. Improvement was noted by the patient's report of decreasing symptoms, improved sleep and appetite, affect, medication tolerance, behavior,  and participation in unit programming.  Patient was asked each day to complete a self inventory noting mood, mental status, pain, new symptoms, anxiety and concerns.     Symptoms were reported as significantly decreased or resolved completely by discharge.    On day of discharge, the patient reports that their mood is stable. The patient denied having suicidal thoughts for more than 48 hours prior to discharge.  Patient denies having homicidal thoughts.  Patient denies having auditory hallucinations.  Patient denies any visual hallucinations or other symptoms of psychosis. The patient was motivated to continue taking medication with a goal of continued improvement in mental health.    The patient reports their target psychiatric symptoms of mania responded well to the psychiatric medications, and the patient reports overall benefit other psychiatric hospitalization. Supportive psychotherapy was provided to the patient. The patient also participated in regular group therapy while hospitalized. Coping skills, problem solving as well as relaxation therapies were also part of the unit programming.   Labs were reviewed with the patient, and abnormal results were discussed with the patient.   The patient is able to verbalize their individual safety plan to this provider.   Behavioral Events: on admission, patient was required to be in the seclusion room for around 20 minutes due to delusions, disorganization,  and agitation  # It is recommended to the patient to continue psychiatric medications as prescribed, after discharge from the hospital.    # It is recommended to the patient to follow up with your outpatient psychiatric provider and PCP.  # It was discussed with the patient, the impact of alcohol, drugs, tobacco have been there overall psychiatric and medical wellbeing, and total abstinence from substance use was recommended to the patient.  # Prescriptions provided or sent directly to preferred  pharmacy at discharge. Patient agreeable to plan. Given opportunity to ask questions. Appears to feel comfortable with discharge.    # In the event of worsening symptoms, the patient is instructed to call the crisis hotline, 911 and or go to the nearest ED for appropriate evaluation and treatment of symptoms. To follow-up with primary care provider for other medical issues, concerns and or health care needs  # Patient was discharged home with a plan to follow up as noted below.  Physical Findings: AIMS: Facial and Oral Movements Muscles of Facial Expression: None, normal Lips and Perioral Area: None, normal Jaw: None, normal Tongue: None, normal,Extremity Movements Upper (arms, wrists, hands, fingers): None, normal Lower (legs, knees, ankles, toes): None, normal, Trunk Movements Neck, shoulders, hips: None, normal, Overall Severity Severity of abnormal movements (highest score from questions above): None, normal Incapacitation due to abnormal movements: None, normal Patient's awareness of abnormal movements (rate only patient's report): No Awareness, Dental Status Current problems with teeth and/or dentures?: No Does patient usually wear dentures?: No  CIWA:    COWS:     Musculoskeletal: Strength & Muscle Tone: within normal limits Gait & Station: normal Patient leans: N/A   No EPS or cogwheeling on exam  Psychiatric Specialty Exam Presentation  General Appearance: Fairly Groomed  Patent attorney:-- (intense)  Speech:Clear and Coherent  Speech Volume:Normal  Handedness:Right   Mood and Affect  Mood:Anxious  Affect:Appropriate   Thought Process  Thought Processes:Disorganized  Descriptions of Associations:Tangential  Orientation:Full (Time, Place and Person)  Thought Content:Delusions  History of Schizophrenia/Schizoaffective disorder:Yes  Duration of Psychotic Symptoms: >6 months Hallucinations:Hallucinations: None  Ideas of Reference:Delusions  Suicidal  Thoughts:Suicidal Thoughts: No  Homicidal Thoughts:Homicidal Thoughts: No   Sensorium  Memory:Immediate Fair; Recent Fair; Remote Fair  Judgment:Intact  Insight:Shallow   Executive Functions  Concentration:Fair  Attention Span:Fair  Recall:Fair  Fund of Knowledge:Fair  Language:Fair   Psychomotor Activity  Psychomotor Activity:Psychomotor Activity: Normal   Assets  Assets:Physical Health; Resilience; Social Support   Sleep  Sleep:Sleep: Fair Number of Hours of Sleep: 5.75    Assets  Assets:Physical Health; Resilience; Social Support   Physical Exam ROS Physical Exam Constitutional:      Appearance: the patient is not toxic-appearing.  Pulmonary:     Effort: Pulmonary effort is normal.  Neurological:     General: No focal deficit present.     Mental Status: the patient is alert and oriented to person, place, and time.   Review of Systems  Respiratory:  Negative for shortness of breath.   Cardiovascular:  Negative for chest pain.  Gastrointestinal:  Negative for abdominal pain, constipation, diarrhea, nausea and vomiting.  Neurological:  Negative for headaches.   Blood pressure 96/72, pulse (!) 108, temperature 98 F (36.7 C), temperature source Oral, resp. rate 14, height 5\' 2"  (1.575 m), weight 88 kg, last menstrual period 09/11/2022, SpO2 98%, unknown if currently breastfeeding. Body mass index is 35.48 kg/m.  Social History   Tobacco Use  Smoking Status Never  Smokeless Tobacco  Never   Tobacco Cessation:  N/A, patient does not currently use tobacco products  Blood Alcohol level:  Lab Results  Component Value Date   ETH <10 09/18/2022   ETH <10 08/04/2022    Metabolic Disorder Labs:  Lab Results  Component Value Date   HGBA1C 6.9 (H) 09/18/2022   MPG 151 09/18/2022   MPG 125.5 11/21/2021   Lab Results  Component Value Date   PROLACTIN 15.7 10/20/2021   PROLACTIN 46.5 (H) 07/25/2015   Lab Results  Component Value Date   CHOL  163 09/23/2022   TRIG 97 09/23/2022   HDL 52 09/23/2022   CHOLHDL 3.1 09/23/2022   VLDL 19 09/23/2022   LDLCALC 92 09/23/2022   LDLCALC 113 (H) 11/21/2021    Discharge destination:  Home  Is patient on multiple antipsychotic therapies at discharge:  Yes,   Do you recommend tapering to monotherapy for antipsychotics?  Yes   Has Patient had three or more failed trials of antipsychotic monotherapy by history:  Yes,   Antipsychotic medications that previously failed include:   1.  Geodon., 2.  Abilify., and 3.  Risperdal.  Recommended Plan for Multiple Antipsychotic Therapies: NA  Discharge Instructions     Call MD for:  difficulty breathing, headache or visual disturbances   Complete by: As directed    Call MD for:  extreme fatigue   Complete by: As directed    Call MD for:  hives   Complete by: As directed    Call MD for:  persistant dizziness or light-headedness   Complete by: As directed    Call MD for:  persistant nausea and vomiting   Complete by: As directed    Call MD for:  redness, tenderness, or signs of infection (pain, swelling, redness, odor or green/yellow discharge around incision site)   Complete by: As directed    Call MD for:  severe uncontrolled pain   Complete by: As directed    Call MD for:  temperature >100.4   Complete by: As directed    Diet - low sodium heart healthy   Complete by: As directed    Increase activity slowly   Complete by: As directed       Allergies as of 09/29/2022       Reactions   Neurontin [gabapentin] Other (See Comments)   "Feet on fire"   Trazodone And Nefazodone Other (See Comments)   Unknown reaction   Fluphenazine Rash   Haldol [haloperidol] Other (See Comments)   Makes feel hot inside. "Tires my tongue"        Medication List     STOP taking these medications    benztropine 1 MG tablet Commonly known as: COGENTIN       TAKE these medications      Indication  divalproex 500 MG 24 hr tablet Commonly  known as: DEPAKOTE ER Take 2 tablets (1,000 mg total) by mouth daily. Start taking on: September 30, 2022 What changed: when to take this  Indication: Manic Phase of Manic-Depression   hydrOXYzine 25 MG tablet Commonly known as: ATARAX Take 1 tablet (25 mg total) by mouth 3 (three) times daily as needed for anxiety.  Indication: Feeling Anxious   losartan 50 MG tablet Commonly known as: COZAAR Take 1 tablet (50 mg total) by mouth daily.  Indication: High Blood Pressure   metFORMIN 500 MG 24 hr tablet Commonly known as: GLUCOPHAGE-XR Take 1 tablet (500 mg total) by mouth 2 (two) times daily with a  meal.  Indication: Type 2 Diabetes   metoprolol succinate 25 MG 24 hr tablet Commonly known as: TOPROL-XL Take 1 tablet (25 mg total) by mouth at bedtime.  Indication: High Blood Pressure   paliperidone 234 MG/1.5ML injection Commonly known as: INVEGA SUSTENNA Inject 234 mg into the muscle once for 1 dose. Administer on 10-27-22. Patient received last dose on 09-27-22 Start taking on: October 27, 2022 What changed:  additional instructions These instructions start on October 27, 2022. If you are unsure what to do until then, ask your doctor or other care provider.  Indication: Schizoaffective Disorder   QUEtiapine 300 MG 24 hr tablet Commonly known as: SEROQUEL XR Take 2 tablets (600 mg total) by mouth at bedtime. What changed:  medication strength how much to take  Indication: Manic-Depression   temazepam 15 MG capsule Commonly known as: RESTORIL Take 1 capsule (15 mg total) by mouth at bedtime as needed for sleep.  Indication: Trouble Sleeping        Follow-up Information     [facility]. Schedule an appointment as soon as possible for a visit.   Why: Please call to schedule a hospital follow up appointment to establish for primary care services as soon as possible. Contact information: [address] [place]  [address] [phone]        [facility]. Call.   Why: Please reach out to this provider if you relocate to [place], [place] for ongoing mental health support, Med Management and therapy. Contact information: Contact information: [address], [place] [address].  Main Phone [phone]) [phone] Main Fax [phone]) [phone]        [facility] Go on 10/27/2022.   Why: You have an appointment for medication management services on 10/27/22 at 11:30 am.  This appoitnment will be held in person. * YOU MUST BRING YOUR INJECTION PRESCRIPTION WITH YOU TO THE APPT. Contact information: [address] [place] [address] [phone]         [facility]. Schedule an appointment as soon as possible for a visit.   Why: Please call this provider personally to schedule an appointment for therapy services (provider request). Contact information: [address] [place] [place] [address] [phone]                 Discharge recommendations:  Activity: as tolerated  Diet: heart healthy  # It is recommended to the patient to continue psychiatric medications as prescribed, after discharge from the hospital.     # It is recommended to the patient to follow up with your outpatient psychiatric provider -instructions on appointment date, time, and address (location) are provided to you in discharge paperwork  # Follow-up with outpatient primary care doctor and other specialists -for management of chronic medical disease, including:  -DM -HTN -armpit nodule  # Testing: Follow-up with outpatient provider for abnormal lab results:  Hgb 11.5    # It was discussed with the patient, the impact of alcohol, drugs, tobacco have been there overall psychiatric and medical wellbeing, and total abstinence from substance use was recommended to the patient.   # Prescriptions provided or sent directly to  preferred pharmacy at discharge. Patient agreeable to plan. Given opportunity to ask questions. Appears to feel comfortable with discharge.    # In the event of worsening symptoms, the patient is instructed to call the crisis hotline, 911 and  or go to the nearest ED for appropriate evaluation and treatment of symptoms. To follow-up with primary care provider for other medical issues, concerns and or health care needs  Patient agrees with D/C instructions and plan.   Total Time Spent in Direct Patient Care:  I personally spent 20 minutes on the unit in direct patient care. The direct patient care time included face-to-face time with the patient, reviewing the patient's chart, communicating with other professionals, and coordinating care. Greater than 50% of this time was spent in counseling or coordinating care with the patient regarding goals of hospitalization, psycho-education, and discharge planning needs.   Signed: Karie Fetch, MD, PGY-2 09/29/2022, 2:24 PM

## 2022-09-29 NOTE — Plan of Care (Signed)
Problem: Activity: Goal: Interest or engagement in activities will improve Outcome: Progressing Goal: Sleeping patterns will improve Outcome: Progressing   Problem: Safety: Goal: Periods of time without injury will increase Outcome: Progressing

## 2022-09-29 NOTE — Progress Notes (Signed)
Pt discharged to lobby. Pt was stable and appreciative at that time. All papers and prescriptions were given and valuables returned. Verbal understanding expressed. Denies SI/HI and A/VH. Pt given opportunity to express concerns and ask questions.

## 2022-09-29 NOTE — Progress Notes (Signed)
   09/29/22 0559  15 Minute Checks  Location Bedroom  Visual Appearance Calm  Behavior Composed  Sleep (Behavioral Health Patients Only)  Calculate sleep? (Click Yes once per 24 hr at 0600 safety check) Yes  Documented sleep last 24 hours 5.75

## 2022-09-29 NOTE — Progress Notes (Signed)
  [facility] Adult Case Management Discharge Plan :  Will you be returning to the same living situation after discharge:  Yes,  with [name] [name]  where pt resides. At discharge, do you have transportation home?: No.Taxi Do you have the ability to pay for your medications: Yes,  Insured Medicare  Release of information consent forms completed and in the chart;  Patient's signature needed at discharge.  Patient to Follow up at:  Follow-up Information     [facility]. Go to.   Specialty: Behavioral Health Why: Please go to this provider for an assessment, to obtain therapy and medication management services. For fastest service, please go Monday through Friday, arrive by 7:00 am for same day service. Contact information: [address] [place] [address] [phone]        [facility]. Schedule an appointment as soon as possible for a visit.   Why: Please call to schedule a hospital follow up appointment to establish for primary care services as soon as possible. Contact information: [address] [place] [address] [phone]        [facility]. Call.   Why: Please reach out to this provider if you relocate to [place], [place] for ongoing mental health support, Med Management and therapy. Contact information: Contact information: [address], [place] [address].  Main Phone [phone]) [phone] Main Fax [phone]                Next level of care provider has access to [facility] Link:yes  Safety Planning and Suicide Prevention discussed: Yes,  Minister Wille Celeste  [phone]     Has patient been referred to the Quitline?: Patient does not use tobacco/nicotine products  Patient has been referred for addiction treatment: Yes, referral information given but appointment not made [facility] (list facility). Patient to continue  working towards treatment goals after discharge. Patient no longer meets criteria for inpatient criteria per attending physician. Continue taking medications as prescribed, nursing to provide instructions at discharge. Follow up with all scheduled appointments.   [name] [name] S [name] Casino, LCSW 09/29/2022, 9:24 AM

## 2022-09-29 NOTE — Progress Notes (Addendum)
   09/28/22 2030  Psych Admission Type (Psych Patients Only)  Admission Status Involuntary  Psychosocial Assessment  Patient Complaints Suspiciousness;Worrying  Eye Contact Fair  Facial Expression Flat  Affect Anxious  Speech Tangential  Interaction Assertive  Motor Activity Fidgety  Appearance/Hygiene Unremarkable  Behavior Characteristics Cooperative  Mood Preoccupied  Thought Process  Coherency Disorganized  Content Preoccupation;Religiosity  Delusions Grandeur;Religious  Perception Derealization  Hallucination None reported or observed  Judgment Impaired  Confusion Mild  Danger to Self  Current suicidal ideation? Denies  Agreement Not to Harm Self Yes  Description of Agreement verbal  Danger to Others  Danger to Others None reported or observed  Danger to Others Abnormal  Harmful Behavior to others No threats or harm toward other people  Destructive Behavior No threats or harm toward property

## 2022-09-29 NOTE — [facility] Group Notes (Signed)
Adult Psychoeducational Group Note  Date:  09/29/2022 Time:  10:09 AM  Group Topic/Focus:  Goals Group:   The focus of this group is to help patients establish daily goals to achieve during treatment and discuss how the patient can incorporate goal setting into their daily lives to aide in recovery. Orientation:   The focus of this group is to educate the patient on the purpose and policies of crisis stabilization and provide a format to answer questions about their admission.  The group details unit policies and expectations of patients while admitted.  Participation Level:  Did Not Attend  Participation Quality:    Affect:    Cognitive:    Insight:   Engagement in Group:    Modes of Intervention:    Additional Comments:    Sheran Lawless 09/29/2022, 10:09 AM

## 2022-09-29 NOTE — Discharge Instructions (Signed)
-  Follow-up with your outpatient psychiatric provider -instructions on appointment date, time, and address (location) are provided to you in discharge paperwork.  -Take your psychiatric medications as prescribed at discharge - instructions are provided to you in the discharge paperwork  -Follow-up with outpatient primary care doctor and other specialists -for management of preventative medicine and any chronic medical disease.  -Recommend abstinence from alcohol, tobacco, and other illicit drug use at discharge.   -If your psychiatric symptoms recur, worsen, or if you have side effects to your psychiatric medications, call your outpatient psychiatric provider, 911, 988 or go to the nearest emergency department.  -If suicidal thoughts occur, call your outpatient psychiatric provider, 911, 988 or go to the nearest emergency department.  Naloxone (Narcan) can help reverse an overdose when given to the victim quickly.  [facility] offers free naloxone kits and instructions/training on its use.  Add naloxone to your first aid kit and you can help save a life.   Pick up your free kit at the following locations:   [place]:  [facility], [address] [place] [address] [phone]) [facility] [address] [place] [address] [phone]) [facility] [address] [place] [address]  [place]: [facility] [address] [place] [address] [phone]) [facility] [address] [place] [address] [phone])

## 2022-09-29 NOTE — BHH Group Notes (Signed)
Spiritual care group facilitated by Chaplain Katy Denessa Cavan, BCC  Group focused on topic of strength. Group members reflected on what thoughts and feelings emerge when they hear this topic. They then engaged in facilitated dialog around how strength is present in their lives. This dialog focused on representing what strength had been to them in their lives (images and patterns given) and what they saw as helpful in their life now (what they needed / wanted).  Activity drew on narrative framework.  Patient Progress: Did not attend.  

## 2022-09-29 NOTE — Progress Notes (Signed)
Recreation Therapy Notes  INPATIENT RECREATION TR PLAN  Patient Details Name: [name] [name] MRN: [id number] DOB: [birth year] Today's Date: 09/29/2022  Rec Therapy Plan Is patient appropriate for Therapeutic Recreation?: Yes Treatment times per week: about 3 days Estimated Length of Stay: 5-7 days TR Treatment/Interventions: Group participation (Comment)  Discharge Criteria Pt will be discharged from therapy if:: Discharged Treatment plan/goals/alternatives discussed and agreed upon by:: Patient/family  Discharge Summary Short term goals set: See pt care plan Short term goals met: Not met Progress toward goals comments: Groups attended Which groups?: Coping skills Reason goals not met: Pt was focused on other things and kept talking about those things and had to be redirected back to the topic of group. Therapeutic equipment acquired: N/A Reason patient discharged from therapy: Discharge from hospital Pt/family agrees with progress & goals achieved: Yes Date patient discharged from therapy: 09/29/22   Laycee Fitzsimmons-McCall, LRT,CTRS Dyllen Menning A Mavric Cortright-McCall 09/29/2022, 1:38 PM

## 2022-09-29 NOTE — Plan of Care (Signed)
Patient attended recreation therapy group and needed redirection multiple times. Patient appeared to come around at times but still needed redirection.   Asami Lambright-McCall, LRT,CTRS

## 2022-09-29 NOTE — BHH Suicide Risk Assessment (Signed)
[facility] Discharge Suicide Risk Assessment  Principal Problem: Schizoaffective disorder, bipolar type [facility]) Discharge Diagnoses: Principal Problem:   Schizoaffective disorder, bipolar type (HCC)  Reason for admission: [name] [name] is a 42 y.o. female  with a past psychiatric history of schizoaffective disorder bipolar type. Patient initially arrived to [facility] on 09/18/22 for foreign body in vagina (none found), agitation, delusions, and paranoia, and admitted to [facility] under IVC on 09/20/22 for crisis stabalization. PPHx is significant for schizoaffective disorder bipolar type, and no history of Suicide Attempts, Self Injurious Behavior. Patient has had 12 ED visits over the past year as well as multiple prior Psychiatric Hospitalizations (most recent [facility] 07/2021). PMHx is significant for diabetes, HTN.   PTA Medications:  Cogentin 1mg  BID  Depakote 1000 ER at bedtime  Seroquel 200mg  at bedtime   Hospital Course:   During the patient's hospitalization, patient had extensive initial psychiatric evaluation, and follow-up psychiatric evaluations every day.  Psychiatric diagnoses provided upon initial assessment:  - Schizoaffective disorder bipolar type, current episode manic   Patient's psychiatric medications were adjusted on admission:     -- Start depakote ER 1000mg  at bedtime     -- Start seroquel 400mg  at bedtime     -- Start restoril 15mg  at bedtime and repeat x1 PRN     -- Change agitation protocol from geodon, zyprexa to haldol 5, ativan 2, benadryl 50  During the hospitalization, other adjustments were made to the patient's psychiatric medication regimen:   -- Depakote ER 1000mg  changed to morning due to somnolence   - VPA level 92, therapeutic, on 09/27/2022   -- Seroquel increased to 600mg  at bedtime for mood stability, psychosis and insomnia   -- Continued restoril 15mg  at bedtime due to insomnia   -- Received [name] Sustenna 234 mg IM (9/16) and Sustenna 156 mg IM Saturday  (9/21) for psychosis and delusions  - Changed agitation protocol from haldol (tied tongue) to zyprexa   Patient's care was discussed during the interdisciplinary team meeting every day during the hospitalization.  The patient denied having side effects to prescribed psychiatric medication.  Gradually, patient started adjusting to milieu. The patient was evaluated each day by a clinical provider to ascertain response to treatment. Improvement was noted by the patient's report of decreasing symptoms, improved sleep and appetite, affect, medication tolerance, behavior, and participation in unit programming.  Patient was asked each day to complete a self inventory noting mood, mental status, pain, new symptoms, anxiety and concerns.    Symptoms were reported as significantly decreased or resolved completely by discharge.   On day of discharge, the patient reports that their mood is stable. The patient denied having suicidal thoughts for more than 48 hours prior to discharge.  Patient denies having homicidal thoughts.  Patient denies having auditory hallucinations.  Patient denies any visual hallucinations or other symptoms of psychosis. The patient was motivated to continue taking medication with a goal of continued improvement in mental health.   The patient reports their target psychiatric symptoms of mania responded well to the psychiatric medications, and the patient reports overall benefit other psychiatric hospitalization. Supportive psychotherapy was provided to the patient. The patient also participated in regular group therapy while hospitalized. Coping skills, problem solving as well as relaxation therapies were also part of the unit programming.  Labs were reviewed with the patient, and abnormal results were discussed with the patient.  The patient is able to verbalize their individual safety plan to this provider.  Behavioral Events: on  admission, patient was required to be in the  seclusion room for around 20 minutes due to delusions, disorganization, and agitation  Sleep  Sleep 5.75 hours   Musculoskeletal: Strength & Muscle Tone: within normal limits Gait & Station: normal Patient leans: N/A  No EPS or cogwheeling on exam  Psychiatric Specialty Exam  Presentation  General Appearance:  Fairly Groomed  Eye Contact: -- (intense)  Speech: Clear and Coherent  Speech Volume: Normal  Handedness: Right   Mood and Affect  Mood: Anxious  Affect: Appropriate   Thought Process  Thought Processes: Disorganized  Descriptions of Associations: Tangential  Orientation: Full (Time, Place and Person)  Thought Content: Delusions  History of Schizophrenia/Schizoaffective disorder: Yes  Duration of Psychotic Symptoms: > 6 months Hallucinations:Hallucinations: None  Ideas of Reference: Delusions  Suicidal Thoughts:Suicidal Thoughts: No  Homicidal Thoughts:Homicidal Thoughts: No   Sensorium  Memory: Immediate Fair; Recent Fair; Remote Fair  Judgment: Intact  Insight: Shallow   Executive Functions  Concentration: Fair  Attention Span: Fair  Recall: Fair  Fund of Knowledge: Fair  Language: Fair   Psychomotor Activity  Psychomotor Activity:Psychomotor Activity: Normal   Assets  Assets: Physical Health; Resilience; Social Support   Sleep  Sleep:Sleep: Fair Number of Hours of Sleep: 5.75    Assets  Assets: Physical Health; Resilience; Social Support   Physical Exam ROS Physical Exam Constitutional:      Appearance: the patient is not toxic-appearing.  Pulmonary:     Effort: Pulmonary effort is normal.  Neurological:     General: No focal deficit present.     Mental Status: the patient is alert and oriented to person, place, and time.   Review of Systems  Respiratory:  Negative for shortness of breath.   Cardiovascular:  Negative for chest pain.  Gastrointestinal:  Negative for abdominal pain,  constipation, diarrhea, nausea and vomiting.  Neurological:  Negative for headaches.  MSK: Positive for armpit nodule.    Blood pressure 96/72, pulse (!) 108, temperature 98 F (36.7 C), temperature source Oral, resp. rate 14, height 5\' 2"  (1.575 m), weight 88 kg, last menstrual period 09/11/2022, SpO2 98%, unknown if currently breastfeeding. Body mass index is 35.48 kg/m.  Mental Status Per Nursing Assessment::   On Admission:  NA  Demographic Factors:  Low socioeconomic status and Unemployed  Loss Factors: NA  Historical Factors: Impulsivity  Risk Reduction Factors:   Living with another person, especially a relative and Positive social support  Continued Clinical Symptoms:  Schizoaffective disorder, bipolar type Previous Psychiatric Diagnoses and Treatments  Cognitive Features That Contribute To Risk:  Loss of executive function and Thought constriction (tunnel vision)    Suicide Risk:  Mild:  Suicidal ideation of limited frequency, intensity, duration, and specificity.  There are no identifiable plans, no associated intent, mild dysphoria and related symptoms, good self-control (both objective and subjective assessment), few other risk factors, and identifiable protective factors, including available and accessible social support.   Follow-up Information     [facility]. Schedule an appointment as soon as possible for a visit.   Why: Please call to schedule a hospital follow up appointment to establish for primary care services as soon as possible. Contact information: [address] [place] [address] [phone]        [facility]. Call.   Why: Please reach out to this provider if you relocate to [place], [place] for ongoing mental health support, Med Management and therapy. Contact information: Contact information:  [address]   [place], [place] [address].  Main Phone [phone]) [phone] Main Fax  [phone]) [phone]        [facility] Go on 10/27/2022.   Why: You have an appointment for medication management services on 10/27/22 at 11:30 am.  This appoitnment will be held in person. * YOU MUST BRING YOUR INJECTION PRESCRIPTION WITH YOU TO THE APPT. Contact information: [address] [place] [address] [phone]                 Discharge recommendations:    Activity: as tolerated  Diet: heart healthy  # It is recommended to the patient to continue psychiatric medications as prescribed, after discharge from the hospital.     # It is recommended to the patient to follow up with your outpatient psychiatric provider -instructions on appointment date, time, and address (location) are provided to you in discharge paperwork  # Follow-up with outpatient primary care doctor and other specialists -for management of chronic medical disease, including:  -DM -HTN -armpit nodule  # Testing: Follow-up with outpatient provider for abnormal lab results:  -Hgb 11.5   # It was discussed with the patient, the impact of alcohol, drugs, tobacco have been there overall psychiatric and medical wellbeing, and total abstinence from substance use was recommended to the patient.   # Prescriptions provided or sent directly to preferred pharmacy at discharge. Patient agreeable to plan. Given opportunity to ask questions. Appears to feel comfortable with discharge.    # In the event of worsening symptoms, the patient is instructed to call the crisis hotline, 911 and or go to the nearest ED for appropriate evaluation and treatment of symptoms. To follow-up with primary care provider for other medical issues, concerns and or health care needs  Patient was referred to ACT team. Denied from [facility] but SW working on other referrals at time of discharge.   Patient agrees with D/C instructions and plan.   Total Time Spent in Direct Patient Care:  I personally spent 20  minutes on the unit in direct patient care. The direct patient care time included face-to-face time with the patient, reviewing the patient's chart, communicating with other professionals, and coordinating care. Greater than 50% of this time was spent in counseling or coordinating care with the patient regarding goals of hospitalization, psycho-education, and discharge planning needs.   Karie Fetch, MD, PGY-2 09/29/2022, 9:48 AM

## 2022-09-30 LAB — T3: T3, Total: 86 ng/dL (ref 71–180)

## 2022-10-01 ENCOUNTER — Ambulatory Visit (HOSPITAL_COMMUNITY)
Admission: EM | Admit: 2022-10-01 | Discharge: 2022-10-01 | Disposition: A | Discharge status: Home / Self Care | Payer: 59 | Attending: Registered Nurse | Admitting: Registered Nurse

## 2022-10-01 ENCOUNTER — Encounter (HOSPITAL_COMMUNITY): Payer: Self-pay | Admitting: Registered Nurse

## 2022-10-01 DIAGNOSIS — F250 Schizoaffective disorder, bipolar type: Secondary | ICD-10-CM | POA: Insufficient documentation

## 2022-10-01 DIAGNOSIS — Z79899 Other long term (current) drug therapy: Secondary | ICD-10-CM | POA: Insufficient documentation

## 2022-10-01 MED ORDER — METFORMIN HCL ER 500 MG PO TB24: 500.0000 mg | ORAL_TABLET | Freq: Two times a day (BID) | ORAL | Status: DC

## 2022-10-01 MED ORDER — LOSARTAN POTASSIUM 50 MG PO TABS
50.0000 mg | ORAL_TABLET | Freq: Every day | ORAL | Status: DC
  Administered 2022-10-01: 50 mg via ORAL
  Filled 2022-10-01: qty 1

## 2022-10-01 MED ORDER — DIVALPROEX SODIUM 500 MG PO DR TAB
DELAYED_RELEASE_TABLET | ORAL | Status: AC
  Filled 2022-10-01: qty 2

## 2022-10-01 MED ORDER — DIVALPROEX SODIUM ER 500 MG PO TB24
1000.0000 mg | ORAL_TABLET | Freq: Every day | ORAL | Status: DC
  Administered 2022-10-01: 1000 mg via ORAL
  Filled 2022-10-01: qty 2

## 2022-10-01 NOTE — Progress Notes (Signed)
Patient discharged back home with [facility]. Patient was provided with her discharge paperwork from [facility] last week and her discharge paperwork from her visit today.  [facility] stated, "I am not her caregiver.  I just allow her to stay with me."
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
## 2022-10-01 NOTE — Discharge Instructions (Addendum)
Morning medications Depakote, Losartan, and Metformin was given during this visit prior to discharge   Invega Sustenna 234 mg was given on 09/27/22 next dose due 10/27/2022    Things to do that was listed on discharge instructions on 09/28/2021  Schedule an appointment with  [facility] as soon as possible for a visit Please call to schedule a hospital follow up appointment to establish for primary care services as soon as possible. [address] [place] [address] [phone]  [facility] Next steps: Call Please reach out to this provider if you relocate to [place], [place] for ongoing mental health support, Med Management and therapy. Contact information: [address], [place] [address]. Main Phone [phone] Main Fax [phone]  Schedule an appointment with [facility] as soon as possible for a visit Please call this provider personally to schedule an appointment for therapy services (provider request). [address] Mervyn Skeeters [place], [place] [address] [phone]  Go to [facility] Monday Oct 27, 2022 You have an appointment for medication management services on 10/27/22 at 11:30 am. This appointment will be held in person. * YOU MUST BRING YOUR INJECTION PRESCRIPTION WITH YOU TO THE APPT. [address] [place] [address] [phone]
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
## 2022-10-02 ENCOUNTER — Ambulatory Visit (HOSPITAL_COMMUNITY)
Admission: EM | Admit: 2022-10-02 | Discharge: 2022-10-02 | Disposition: A | Discharge status: Home / Self Care | Payer: 59 | Attending: Psychiatry | Admitting: Psychiatry

## 2022-10-02 DIAGNOSIS — R410 Disorientation, unspecified: Secondary | ICD-10-CM | POA: Diagnosis present

## 2022-10-02 DIAGNOSIS — F250 Schizoaffective disorder, bipolar type: Secondary | ICD-10-CM | POA: Diagnosis not present

## 2022-10-02 NOTE — Care Management (Signed)
OBS Care Management   Writer contacted the patient legal guardian and Doylene Canning and informed him that the patient will be discharging and will need to be picked up.   Writer informed the NP, Rodell Perna that the patient guardian has been informed.

## 2022-10-02 NOTE — ED Provider Notes (Signed)
Behavioral Health Urgent Care Medical Screening Exam  Patient Name: [name] [name] MRN: [id number] Date of Evaluation: 10/02/22 Chief Complaint:  triage note:Pt presents voluntarily to [facility] unaccompanied. Pt states that she stayed that the [facility] even though she has her own place. Pt came to the facility because she thought that she had an appointment this morning. Pt states that she doesn't sleep because she has to guard her place. Pt denies SI, HI, AVH and alcohol/drug use at this present time.   Diagnosis:  Final diagnoses:  Schizoaffective disorder, bipolar type (HCC)    History of Present illness: [name] [name] is a 42 y.o. female patient with a past psychiatric history significant for schizoaffective, bipolar type who presents to the [facility] behavioral health urgent care voluntary because she thought she had an appointment here this morning.  On evaluation, patient is alert and oriented x 4. Her thought process is disorganized with a flight of ideas and grandiosity thought content. Her speech is tangential. Her mood is anxious and affect is congruent. She has fair eye contact. She denies SI/HI/AVH. There is no objective evidence that the patient is currently responding to internal or external stimuli on exam. She is calm and cooperative and does not appear to be in acute distress.  Patient states that she is here because she thought she had an appointment  here  [facility] outpatient clinic) this morning. Patient was advised that she has an upcoming appointment with Dr. Maggie Schwalbe on 10/27/22 for medication management. She asked if that information could be written down. I discussed adding the follow up appointments to the discharge instructions at the time of discharge. She states that her caretaker Orvis Brill told her to come here for an evaluation this morning and then she can come home. She states that she caught the bus from the [facility]. She states that she stayed at the [facility] last  night and that it was uncomfortable because she slept on the floor and it was impossible to sleep. 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 She states that she works for [employer]. She states that she hasn't taken her medications today and that pastor will give her the medications today when he picks her up. She states that she want to go back home.   Per chart review, patient appears to be at baseline as she is noted to have a extensive history of fixed delusions. Patient was hospitalized at [facility] from 09/19/22 to 09/29/22 and was prescribed Depakote 1000 mg p.o. daily, Invega 234 mg IM, next dose due on 10/27/2022, Seroquel 600 mg nightly, Restoril 15 nightly as needed for sleep. Patient has a follow up appointment with Dr. Maggie Schwalbe at [facility] on 10/27/22 for medication management and to receive next LAI. Patient was also evaluated here at the [facility] on 10/01/22 and was determine to be at baseline with recommendations to take prescribed medications and follow up with Dr. Maggie Schwalbe for medication management.   Per Ava, CSW note:  OBS Care Management    Writer contacted the patient legal guardian and Doylene Canning and informed him that the patient will be discharging and will need to be picked up.    Writer informed the NP, Rodell Perna that the patient guardian has been informed   Flowsheet Row ED from 10/02/2022 in [facility] ED from 10/01/2022 in [facility] (Discharged[facility]  C-SSRS RISK CATEGORY No Risk No Risk No Risk       Psychiatric Specialty Exam  Presentation  General Appearance:Appropriate for Environment  Eye Contact:Fair  Speech:Clear and Coherent  Speech Volume:Normal  Handedness:Right   Mood and Affect  Mood: Anxious  Affect: Congruent   Thought Process  Thought  Processes: Disorganized  Descriptions of Associations:Tangential  Orientation:Full (Time, Place and Person)  Thought Content:Delusions  Diagnosis of Schizophrenia or Schizoaffective disorder in past: Yes  Duration of Psychotic Symptoms: Greater than six months  Hallucinations:None hears angels  Ideas of Reference:Delusions  Suicidal Thoughts:No  Homicidal Thoughts:No   Sensorium  Memory: Immediate Fair  Judgment: Intact  Insight: Present   Executive Functions  Concentration: Fair  Attention Span: Fair  Recall: [employer] of Knowledge: Fair  Language: Fair   Psychomotor Activity  Psychomotor Activity: Normal   Assets  Assets: Manufacturing systems engineer; Desire for Improvement; Financial Resources/Insurance; Housing; Leisure Time; Social Support; Physical Health   Sleep  Sleep: Poor  Number of hours:  5.75   Physical Exam: Physical Exam HENT:     Head: Normocephalic.     Nose: Nose normal.  Cardiovascular:     Rate and Rhythm: Normal rate.  Pulmonary:     Effort: Pulmonary effort is normal.  Musculoskeletal:        General: Normal range of motion.     Cervical back: Normal range of motion.  Neurological:     Mental Status: She is alert and oriented to person, place, and time.    Review of Systems  Constitutional: Negative.   HENT: Negative.    Eyes: Negative.   Respiratory: Negative.    Cardiovascular: Negative.   Gastrointestinal: Negative.   Genitourinary: Negative.   Musculoskeletal: Negative.   Neurological: Negative.   Endo/Heme/Allergies: Negative.    Blood pressure 112/78, pulse 84, temperature 98.2 F (36.8 C), temperature source Oral, resp. rate 20, last menstrual period 09/11/2022, SpO2 98%, unknown if currently breastfeeding. There is no height or weight on file to calculate BMI.  Musculoskeletal: Strength & Muscle Tone: within normal limits Gait & Station: normal Patient leans: N/A   [facility] MSE Discharge  Disposition for Follow up and Recommendations: Based on my evaluation the patient does not appear to have an emergency medical condition and can be discharged with resources and follow up care in outpatient services for Medication Management  Discharge recommendations:   Medications: Patient is to take medications as prescribed. The patient or patient's guardian is to contact a medical professional and/or outpatient provider to address any new side effects that develop. The patient or the patient's guardian should update outpatient providers of any new medications and/or medication changes.   Outpatient Follow up: Please follow up with [facility] on 10/27/22 at 11:30 am for medication management.   Therapy: We recommend that patient participate in individual therapy to address mental health concerns.  Atypical antipsychotics: If you are prescribed an atypical antipsychotic, it is recommended that your height, weight, BMI, blood pressure, fasting lipid panel, and fasting blood sugar be monitored by your outpatient providers.  Safety:   The following safety precautions should be taken:   No sharp objects. This includes scissors, razors, scrapers, and putty knives.   Chemicals should be removed and locked up.   Medications should be removed and locked up.   Weapons should be removed and locked up. This includes firearms, knives and instruments that can be used to cause injury.   The patient should abstain from use of  illicit substances/drugs and abuse of any medications.  If symptoms worsen or do not continue to improve or if the patient becomes actively suicidal or homicidal then it is recommended that the patient return to the closest hospital emergency department, the [facility], or call 911 for further evaluation and treatment. National Suicide Prevention Lifeline 1-800-SUICIDE or 817-423-9534.  About 988 988 offers 24/7 access to trained crisis  counselors who can help people experiencing mental health-related distress. People can call or text 988 or chat 988lifeline.org for themselves or if they are worried about a loved one who may need crisis support.   [name] Sustenna 234 mg was given on 09/27/22 next dose due 10/27/2022  Things to do that was listed on discharge instructions on 09/28/2021:   Schedule an appointment with [facility] as soon as possible for a visit Please call to schedule a hospital follow up appointment to establish for primary care services as soon as possible. [address] [place] [address] [phone]  [facility] Next steps: Call Please reach out to this provider if you relocate to [place], [place] for ongoing mental health support, Med Management and therapy. Contact information: [address], [place] [address]. Main Phone [phone] Main Fax [phone]  Schedule an appointment with [facility] as soon as possible for a visit Please call this provider personally to schedule an appointment for therapy services (provider request). [address] Mervyn Skeeters [place], [place] [address] [phone]  Go to [facility] Monday Oct 27, 2022 You have an appointment for medication management services on 10/27/22 at 11:30 am. This appointment will be held in person. * YOU MUST BRING YOUR INJECTION PRESCRIPTION WITH YOU TO THE APPT. [address] [place] [address] Layla Barter, NP 10/02/2022, 12:00 PM
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
## 2022-10-02 NOTE — Progress Notes (Signed)
   10/02/22 1101  [facility] Triage Screening (Walk-ins at [facility] only)  How Did You Hear About [place]? Self  What Is the Reason for Your Visit/Call Today? Pt presents voluntarily to [facility] unaccompanied. Pt states that she stayed that the [facility] even though she has her own place. Pt came to the facility because she thought that she had an appointment this morning. Pt states that she doesn't sleep because she has to guard her place. Pt denies SI, HI, AVH and alcohol/drug use at this present time.  How Long Has This Been Causing You Problems? <Week  Have You Recently Had Any Thoughts About Hurting Yourself? No  Are You Planning to Commit Suicide/Harm Yourself At This time? No  Have you Recently Had Thoughts About Hurting Someone [name]? No  Are You Planning To Harm Someone At This Time? No  Are you currently experiencing any auditory, visual or other hallucinations? No  Have You Used Any Alcohol or Drugs in the Past 24 Hours? No  Do you have any current medical co-morbidities that require immediate attention? No  Clinician description of patient physical appearance/behavior: rambling thoughts  What Do You Feel Would Help You the Most Today? Social Support  If access to [facility] was not available, would you have sought care in the Emergency Department? No  Determination of Need Routine (7 days)  Options For Referral Outpatient Therapy

## 2022-10-02 NOTE — Discharge Instructions (Addendum)
Discharge recommendations:   Medications: Patient is to take medications as prescribed. The patient or patient's guardian is to contact a medical professional and/or outpatient provider to address any new side effects that develop. The patient or the patient's guardian should update outpatient providers of any new medications and/or medication changes.    Outpatient Follow up: Please follow up with [facility] on 10/27/22 at 11:30 am for medication management.   Therapy: We recommend that patient participate in individual therapy to address mental health concerns.  Atypical antipsychotics: If you are prescribed an atypical antipsychotic, it is recommended that your height, weight, BMI, blood pressure, fasting lipid panel, and fasting blood sugar be monitored by your outpatient providers.  Safety:   The following safety precautions should be taken:   No sharp objects. This includes scissors, razors, scrapers, and putty knives.   Chemicals should be removed and locked up.   Medications should be removed and locked up.   Weapons should be removed and locked up. This includes firearms, knives and instruments that can be used to cause injury.   The patient should abstain from use of illicit substances/drugs and abuse of any medications.  If symptoms worsen or do not continue to improve or if the patient becomes actively suicidal or homicidal then it is recommended that the patient return to the closest hospital emergency department, the [facility], or call 911 for further evaluation and treatment. National Suicide Prevention Lifeline 1-800-SUICIDE or 905-257-1218.  About 988 988 offers 24/7 access to trained crisis counselors who can help people experiencing mental health-related distress. People can call or text 988 or chat 988lifeline.org for themselves or if they are worried about a loved one who may need crisis support.        [name] Sustenna 234 mg  was given on 09/27/22 next dose due 10/27/2022  Things to do that was listed on discharge instructions on 09/28/2021:   Schedule an appointment with [facility] as soon as possible for a visit Please call to schedule a hospital follow up appointment to establish for primary care services as soon as possible. [address] [place] [address] [phone]  [facility] Next steps: Call Please reach out to this provider if you relocate to [place], [place] for ongoing mental health support, Med Management and therapy. Contact information: [address], [place] [address]. Main Phone [phone] Main Fax [phone]  Schedule an appointment with [facility] as soon as possible for a visit Please call this provider personally to schedule an appointment for therapy services (provider request). [address] Mervyn Skeeters [place], [place] [address] [phone]  Go to [facility] Monday Oct 27, 2022 You have an appointment for medication management services on 10/27/22 at 11:30 am. This appointment will be held in person. * YOU MUST BRING YOUR INJECTION PRESCRIPTION WITH YOU TO THE APPT. [address] [place] [address]

## 2022-10-05 ENCOUNTER — Ambulatory Visit (HOSPITAL_COMMUNITY)
Admission: EM | Admit: 2022-10-05 | Discharge: 2022-10-05 | Disposition: A | Discharge status: Other Acute Inpatient Hospital | Payer: 59 | Attending: Psychiatry | Admitting: Psychiatry

## 2022-10-05 DIAGNOSIS — F250 Schizoaffective disorder, bipolar type: Secondary | ICD-10-CM | POA: Diagnosis not present

## 2022-10-05 DIAGNOSIS — F411 Generalized anxiety disorder: Secondary | ICD-10-CM | POA: Diagnosis not present

## 2022-10-05 DIAGNOSIS — F22 Delusional disorders: Secondary | ICD-10-CM | POA: Diagnosis not present

## 2022-10-05 LAB — COMPREHENSIVE METABOLIC PANEL
ALT: 19 U/L (ref 0–44)
AST: 20 U/L (ref 15–41)
Albumin: 3.4 g/dL — ABNORMAL LOW (ref 3.5–5.0)
Alkaline Phosphatase: 42 U/L (ref 38–126)
Anion gap: 9 (ref 5–15)
BUN: 8 mg/dL (ref 6–20)
CO2: 25 mmol/L (ref 22–32)
Calcium: 8.9 mg/dL (ref 8.9–10.3)
Chloride: 105 mmol/L (ref 98–111)
Creatinine, Ser: 0.79 mg/dL (ref 0.44–1.00)
GFR, Estimated: >60 mL/min (ref >60)
Glucose, Bld: 113 mg/dL — ABNORMAL HIGH (ref 70–99)
Potassium: 4.1 mmol/L (ref 3.5–5.1)
Sodium: 139 mmol/L (ref 135–145)
Total Bilirubin: 0.3 mg/dL (ref 0.3–1.2)
Total Protein: 7 g/dL (ref 6.5–8.1)

## 2022-10-05 LAB — CBC WITH DIFFERENTIAL/PLATELET
Abs Immature Granulocytes: 0.01 10*3/uL (ref 0.00–0.07)
Basophils Absolute: 0 10*3/uL (ref 0.0–0.1)
Basophils Relative: 0 %
Eosinophils Absolute: 0 10*3/uL (ref 0.0–0.5)
Eosinophils Relative: 1 %
HCT: 32.3 % — ABNORMAL LOW (ref 36.0–46.0)
Hemoglobin: 10.8 g/dL — ABNORMAL LOW (ref 12.0–15.0)
Immature Granulocytes: 0 %
Lymphocytes Relative: 33 %
Lymphs Abs: 1.9 10*3/uL (ref 0.7–4.0)
MCH: 30.6 pg (ref 26.0–34.0)
MCHC: 33.4 g/dL (ref 30.0–36.0)
MCV: 91.5 fL (ref 80.0–100.0)
Monocytes Absolute: 0.6 10*3/uL (ref 0.1–1.0)
Monocytes Relative: 10 %
Neutro Abs: 3.2 10*3/uL (ref 1.7–7.7)
Neutrophils Relative %: 56 %
Platelets: 233 10*3/uL (ref 150–400)
RBC: 3.53 MIL/uL — ABNORMAL LOW (ref 3.87–5.11)
RDW: 13.9 % (ref 11.5–15.5)
WBC: 5.7 10*3/uL (ref 4.0–10.5)
nRBC: 0 % (ref 0.0–0.2)

## 2022-10-05 LAB — POC URINE PREG, ED: Preg Test, Ur: NEGATIVE

## 2022-10-05 LAB — POCT URINE DRUG SCREEN - MANUAL ENTRY (I-SCREEN)
POC Amphetamine UR: NOT DETECTED
POC Buprenorphine (BUP): NOT DETECTED
POC Cocaine UR: NOT DETECTED
POC Marijuana UR: NOT DETECTED
POC Methadone UR: NOT DETECTED
POC Methamphetamine UR: NOT DETECTED
POC Morphine: NOT DETECTED
POC Oxazepam (BZO): POSITIVE — AB
POC Oxycodone UR: NOT DETECTED
POC Secobarbital (BAR): NOT DETECTED

## 2022-10-05 LAB — MAGNESIUM: Magnesium: 1.9 mg/dL (ref 1.7–2.4)

## 2022-10-05 LAB — TSH: TSH: 0.963 u[IU]/mL (ref 0.350–4.500)

## 2022-10-05 LAB — VALPROIC ACID LEVEL: Valproic Acid Lvl: 71 ug/mL (ref 50.0–100.0)

## 2022-10-05 LAB — POCT PREGNANCY, URINE: Preg Test, Ur: NEGATIVE

## 2022-10-05 LAB — ETHANOL: Alcohol, Ethyl (B): <10 mg/dL (ref <10)

## 2022-10-05 MED ORDER — ALUM & MAG HYDROXIDE-SIMETH 200-200-20 MG/5ML PO SUSP: 30.0000 mL | ORAL | Status: DC | PRN

## 2022-10-05 MED ORDER — ACETAMINOPHEN 325 MG PO TABS: 650.0000 mg | ORAL_TABLET | Freq: Four times a day (QID) | ORAL | Status: DC | PRN

## 2022-10-05 MED ORDER — LOSARTAN POTASSIUM 50 MG PO TABS: 50.0000 mg | ORAL_TABLET | Freq: Every day | ORAL | Status: DC

## 2022-10-05 MED ORDER — MAGNESIUM HYDROXIDE 400 MG/5ML PO SUSP: 30.0000 mL | Freq: Every day | ORAL | Status: DC | PRN

## 2022-10-05 MED ORDER — METOPROLOL SUCCINATE ER 25 MG PO TB24: 25.0000 mg | ORAL_TABLET | Freq: Every day | ORAL | Status: DC

## 2022-10-05 MED ORDER — HYDROXYZINE HCL 25 MG PO TABS: 25.0000 mg | ORAL_TABLET | Freq: Three times a day (TID) | ORAL | Status: DC | PRN

## 2022-10-05 MED ORDER — TEMAZEPAM 15 MG PO CAPS: 15.0000 mg | ORAL_CAPSULE | Freq: Every evening | ORAL | Status: DC | PRN

## 2022-10-05 MED ORDER — DIVALPROEX SODIUM ER 500 MG PO TB24: 500.0000 mg | ORAL_TABLET | Freq: Two times a day (BID) | ORAL | Status: DC

## 2022-10-05 MED ORDER — METFORMIN HCL ER 500 MG PO TB24
500.0000 mg | ORAL_TABLET | Freq: Two times a day (BID) | ORAL | Status: DC
  Administered 2022-10-05: 500 mg via ORAL
  Filled 2022-10-05: qty 1

## 2022-10-05 MED ORDER — QUETIAPINE FUMARATE ER 300 MG PO TB24: 600.0000 mg | ORAL_TABLET | Freq: Every day | ORAL | Status: DC

## 2022-10-05 NOTE — ED Notes (Signed)
Patient in milieu. Environment is secured. Will continue to monitor for safety. 

## 2022-10-05 NOTE — ED Provider Notes (Addendum)
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 hours after ingestion  and >50 ug/mL at 12 hours after ingestion are often associated with  toxic reactions.  Performed at [facility], [address] [place], [place] [address]    Valproic Acid Lvl 08/04/2022 34 (L)  50.0 - 100.0 ug/mL Final   Performed at [facility], [address] [place], [place] [address]   TSH 08/04/2022 0.489  0.350 - 4.500 uIU/mL Final   Comment: Performed by a 3rd Generation assay with a functional sensitivity of <=0.01 uIU/mL. Performed at [facility], [address] [place], [place] [address]    Free T4 08/04/2022 1.07  0.61 - 1.12 ng/dL Final   Comment: (NOTE) Biotin ingestion may interfere with free T4 tests. If the results are inconsistent with the TSH level, previous test results, or the clinical presentation, then consider biotin interference. If needed, order repeat testing after stopping biotin. Performed at [facility], [address] [place], [place] [address]   Admission on 07/24/2022, Discharged on 07/24/2022  Component Date Value Ref Range Status   Sodium 07/24/2022 133 (L)  135 - 145 mmol/L Final   Potassium 07/24/2022 3.5  3.5 - 5.1 mmol/L Final   Chloride 07/24/2022 102  98 - 111 mmol/L Final   CO2 07/24/2022 23  22 - 32 mmol/L Final   Glucose, Bld 07/24/2022 130 (H)  70 - 99 mg/dL Final   Glucose reference range applies only to samples taken after fasting for at least 8 hours.   BUN 07/24/2022 8  6 - 20 mg/dL Final   Creatinine, Ser 07/24/2022 0.90  0.44 - 1.00 mg/dL Final   Calcium 32/44/0102 9.0  8.9 - 10.3 mg/dL Final   Total Protein 72/53/6644 7.7  6.5 - 8.1 g/dL Final   Albumin 03/47/4259 3.8  3.5 - 5.0 g/dL Final   AST 56/38/7564 15  15 - 41 U/L Final   ALT 07/24/2022 12  0 - 44 U/L Final   Alkaline Phosphatase 07/24/2022 39  38 - 126 U/L Final   Total Bilirubin 07/24/2022 0.7  0.3 - 1.2 mg/dL Final   GFR, Estimated 07/24/2022 >60  >60 mL/min Final   Comment: (NOTE) Calculated using the CKD-EPI Creatinine Equation (2021)    Anion gap 07/24/2022 8  5 - 15 Final   Performed at [facility], [address], [place], [place] [address]   Alcohol, Ethyl (B) 07/24/2022 <10  <10 mg/dL Final   Comment: (NOTE) Lowest detectable limit for serum alcohol is 10 mg/dL.  For  medical purposes only. Performed at [facility], [address], [place], [place] [address]    Salicylate Lvl 07/24/2022 <7.0 (L)  7.0 - 30.0 mg/dL Final   Performed at [facility], [address], [place], [place] [address]   Acetaminophen (Tylenol), Serum 07/24/2022 <10 (L)  10 - 30 ug/mL Final   Comment: (NOTE) Therapeutic concentrations vary significantly. A range of 10-30 ug/mL  may be an effective concentration for many patients. However,  some  are best treated at concentrations outside of this range. Acetaminophen concentrations >150 ug/mL at 4 hours after ingestion  and >50 ug/mL at 12 hours after ingestion are often associated with  toxic reactions.  Performed at [facility], [address] [place], [place] [address]    WBC 07/24/2022 4.0  4.0 - 10.5 K/uL Final   RBC 07/24/2022 3.94  3.87 - 5.11 MIL/uL Final   Hemoglobin 07/24/2022 12.6  12.0 - 15.0 g/dL Final   HCT 19/14/7829 37.0  36.0 - 46.0 % Final   MCV 07/24/2022 93.9  80.0 - 100.0 fL Final   MCH 07/24/2022 32.0  26.0 - 34.0 pg Final   MCHC 07/24/2022 34.1  30.0 - 36.0 g/dL Final   RDW 56/21/3086 13.3  11.5 - 15.5 % Final   Platelets 07/24/2022 195  150 - 400 K/uL Final   nRBC 07/24/2022 0.0  0.0 - 0.2 % Final   Performed at [facility], [address], [place], [place] [address]   Preg, Serum 07/24/2022 NEGATIVE  NEGATIVE Final   Comment:        THE SENSITIVITY OF THIS METHODOLOGY IS >10 mIU/mL. Performed at [facility], [address], [place], [place] [address]     Allergies: Neurontin [gabapentin], Trazodone and nefazodone, Fluphenazine, and Haldol [haloperidol]  Medications:  Facility Ordered Medications  Medication   acetaminophen (TYLENOL) tablet 650 mg   alum & mag hydroxide-simeth (MAALOX/MYLANTA) 200-200-20 MG/5ML suspension 30 mL   magnesium hydroxide (MILK OF MAGNESIA) suspension 30 mL   hydrOXYzine (ATARAX) tablet 25 mg   PTA Medications  Medication Sig    losartan (COZAAR) 50 MG tablet Take 1 tablet (50 mg total) by mouth daily.   metoprolol succinate (TOPROL-XL) 25 MG 24 hr tablet Take 1 tablet (25 mg total) by mouth at bedtime.   QUEtiapine (SEROQUEL XR) 300 MG 24 hr tablet Take 2 tablets (600 mg total) by mouth at bedtime.   temazepam (RESTORIL) 15 MG capsule Take 1 capsule (15 mg total) by mouth at bedtime as needed for sleep.   metFORMIN (GLUCOPHAGE-XR) 500 MG 24 hr tablet Take 1 tablet (500 mg total) by mouth 2 (two) times daily with a meal.   divalproex (DEPAKOTE ER) 500 MG 24 hr tablet Take 2 tablets (1,000 mg total) by mouth daily.   [START ON 10/27/2022] paliperidone (INVEGA SUSTENNA) 234 MG/1.5ML injection Inject 234 mg into the muscle once for 1 dose. Administer on 10-27-22. Patient received last dose on 09-27-22   hydrOXYzine (ATARAX) 25 MG tablet Take 1 tablet (25 mg total) by mouth 3 (three) times daily as needed for anxiety.      Medical Decision Making  Patient meets criteria for admission and treatment. Patient needs inpatient psychiatric admission for stabilization and treatment. Will restart home medications. EDP, RN, and LCSW notified of disposition.  .   Labs  CBC, CMP, RPR, TSH, UPT, UDS, Hepatic function panel, Lipid panel  Ethanol, Magnesium, UA   EKG    Recommendations  Based on my evaluation the patient does not appear to have an emergency medical condition.  Gerry Heaphy MOTLEY-MANGRUM, PMHNP 10/05/22  11:56 AM

## 2022-10-05 NOTE — ED Notes (Signed)
Voluntary consent signed

## 2022-10-05 NOTE — ED Notes (Signed)
Pt admitted to obs. Denies  SI/HI/AVH. Calm, cooperative throughout interview process. Skin assessment completed. Oriented to unit. Meal and drink offered. At currrent, pt continue to  deny  SI/HI/AVH. Pt  could not verbally contract for safety.  Will monitor for safety. Patient is very delusional.

## 2022-10-05 NOTE — ED Notes (Signed)
Safe transport called and stated that they will be here around 1900

## 2022-10-05 NOTE — ED Notes (Signed)
Mr. [name] [name] was informed that patient was going to [facility] per provider

## 2022-10-05 NOTE — Progress Notes (Signed)
BHH/BMU LCSW Progress Note   10/05/2022    3:37 PM  [name] [name]   [id number]   Type of Contact and Topic:  Psychiatric Bed Placement   Pt accepted to [facility] Adult [facility]       Patient meets inpatient criteria per Alona Bene, NP  The attending provider will be Dr. Sherrian Divers  Call report to [phone]  Harless Litten, RN @ [facility] notified.     Pt scheduled  to arrive at [facility] TODAY.    Damita Dunnings, MSW, LCSW-A  3:39 PM 10/05/2022

## 2022-10-05 NOTE — ED Notes (Signed)
Report called to Dahlia Client, Charity fundraiser at [facility].   Notified Safe transport of the need for transport to facility

## 2022-10-05 NOTE — Progress Notes (Signed)
Patient has been denied by [facility] and has been denied by [facility] and has been faxed out. Patient meets BH inpatient criteria per Alona Bene, NP. Patient has been faxed out to the following facilities:   [facility]  [address] [place] [place] [address] [phone]  [facility]  [address] [place] [address] [phone]  [facility]  [address] [place] [address] [phone]  [facility]  [address] [place] [address] [phone]  [facility]  [address] [place] [address] [phone]  [facility]  [address], [place] [address] [phone]  [facility]  [address] [place] [address] [phone]  [facility]  [address] [place] [address] [phone]  [facility]  [address] [place], [place] [phone]  [facility]  [address] [place], [place] [address] [phone]  [facility]  [address], [place] [address] [phone]  [facility]  [address] [place] [address] [phone]  [facility]  [address] [place] [address] [phone]  [facility] [address]  [address], [place] [address] [phone]  [facility]  [address] [place] [address] [phone]   Damita Dunnings, MSW, LCSW-A  2:03 PM 10/05/2022

## 2022-10-05 NOTE — ED Notes (Addendum)
Patient A&O x 4, ambulatory. Patient discharged in no acute distress. Patient denied SI/HI, A/VH upon discharge.  Pt belongings returned to patient from locker # 30 intact. Patient escorted to lobby via staff for transport to destination. Safety maintained.  Patient transported to [facility] via safe transport. Rn called Dahlia Client at [facility] to tell her that the patient just left. States that she will pass to night shift.

## 2022-10-05 NOTE — Progress Notes (Signed)
   10/05/22 1014  BHUC Triage Screening (Walk-ins at [facility] only)  How Did You Hear About [place]? Legal System  What Is the Reason for Your Visit/Call Today? Murray-Taylor is a 42 year old woman who presents voluntarily to [facility] escorted by [employer]. Pt states, "I am looking for a place to live and need transportation". Pt is delusional at this time of triage. Pt reports to be taking her prescribed medications, and is uncertain as to why she is here. Pt also reports she wants an ACT Team. Pt denies substance use, SI, HI, and AVH.  How Long Has This Been Causing You Problems? > than 6 months  Have You Recently Had Any Thoughts About Hurting Yourself? No  Are You Planning to Commit Suicide/Harm Yourself At This time? No  Have you Recently Had Thoughts About Hurting Someone [name]? No  Are You Planning To Harm Someone At This Time? No  Are you currently experiencing any auditory, visual or other hallucinations? No  Have You Used Any Alcohol or Drugs in the Past 24 Hours? No  Do you have any current medical co-morbidities that require immediate attention? No  Clinician description of patient physical appearance/behavior: well groomed, incompetent  What Do You Feel Would Help You the Most Today? Housing Assistance  If access to [facility] Ur[facility] Care was not available, would you have sought care in the Emergency Department? No  Determination of Need Routine (7 days)  Options For Referral Intensive Outpatient Therapy

## 2022-10-05 NOTE — BH Assessment (Signed)
Comprehensive Clinical Assessment (CCA) Screening, Triage and Referral Note  10/05/2022 [name] [name] [id number]  Disposition: Per Alona Bene PMHNP, patient is recommending for inpatient treatment.   The patient demonstrates the following risk factors for suicide: Chronic risk factors for suicide include: psychiatric disorder of Schizoaffective disorder, bipolar type  . Acute risk factors for suicide include: family or marital conflict. Protective factors for this patient include: positive therapeutic relationship. Considering these factors, the overall suicide risk at this point appears to be low. Patient is not appropriate for outpatient follow up.   Chief Complaint:  Chief Complaint  Patient presents with   Delusional   Visit Diagnosis: Schizoaffective disorder, bipolar type   Patient Reported Information How did you hear about [place]? Legal System  What Is the Reason for Your Visit/Call Today? [name] [name] is a 42 year old woman who presents voluntarily to [facility] escorted by [employer]. Pt states, "I am looking for a place to live and need transportation". Pt is delusional at this time of triage. Pt reports to be taking her prescribed medications, and is uncertain as to why she is here. Pt also reports she wants an ACT Team. Pt denies substance use, SI, HI, and AVH.  Patient reports that she is seeking help with getting custody of her daughter and she wants to be connected to an ACT team. Patient reports that she wants to have her own housing and that she needs help with getting food stamps and other resources in the community. Patient has presented to [facility] several times in the past week. Patient continues to present with mixed delusions and hyper religious. Patient is disorganized but easily redirectable. Patient denies SI, HI, AVH and substance use. Per provider patient is witnessed responding to internal stimuli during assessment.       How Long Has This Been Causing You Problems?  > than 6 months  What Do You Feel Would Help You the Most Today? Housing Assistance   Have You Recently Had Any Thoughts About Hurting Yourself? No  Are You Planning to Commit Suicide/Harm Yourself At This time? No   Have you Recently Had Thoughts About Hurting Someone [name] [name]? No  Are You Planning to Harm Someone at This Time? No  Explanation: NA   Have You Used Any Alcohol or Drugs in the Past 24 Hours? No  How Long Ago Did You Use Drugs or Alcohol? NA What Did You Use and How Much? NA   Do You Currently Have a Therapist/Psychiatrist? Yes  Name of Therapist/Psychiatrist: MEDICATION MANAGEMENT   Have You Been Recently Discharged From Any Office Practice or Programs? Yes  Explanation of Discharge From Practice/Program: DISCHARGED FROM BHUC    CCA Screening Triage Referral Assessment Type of Contact: Face-to-Face  Telemedicine Service Delivery:   Is this Initial or Reassessment? Is this Initial or Reassessment?: Reassessment  Date Telepsych consult ordered in CH[facility]:  Date Telepsych consult ordered in CHL:  (NA)  Time Telepsych consult ordered in CHL:  Time Telepsych consult ordered in CHL: 0000 (NA)  Location of Assessment: [facility]  Provider Location: [facility] Assessment Services    Collateral Involvement: NA   Does Patient Have a Automotive engineer Guardian? NA Name and Contact of Legal Guardian: NA If Minor and Not Living with Parent(s), Who has Custody? NA  Is CPS involved or ever been involved? Never  Is APS involved or ever been involved? Never   Patient Determined To Be At Risk for Harm To Self or Others Based on  Review of Patient Reported Information or Presenting Complaint? No  Method: No Plan  Availability of Means: No access or NA  Intent: Vague intent or NA  Notification Required: No need or identified person  Additional Information for Danger to Others Potential: -- (NA)  Additional Comments for Danger to Others  Potential: NA  Are There Guns or Other Weapons in Your Home? No  Types of Guns/Weapons: NA  Are These Weapons Safely Secured?                            -- (NA)  Who Could Verify You Are Able To Have These Secured: CARE TAKER  Do You Have any Outstanding Charges, Pending Court Dates, Parole/Probation? DENIES  Contacted To Inform of Risk of Harm To Self or Others: Family/Significant Other:   Does Patient Present under Involuntary Commitment? No    [place] of Residence: [place]   Patient Currently Receiving the Following Services: Medication Management   Determination of Need: Routine (7 days)   Options For Referral: Intensive Outpatient Therapy; Other: Comment (ACTT)   Discharge Disposition:     Audree Camel, [facility]

## 2022-10-06 LAB — PROLACTIN: Prolactin: 76.2 ng/mL — ABNORMAL HIGH (ref 4.8–33.4)

## 2022-10-17 ENCOUNTER — Ambulatory Visit (HOSPITAL_COMMUNITY)
Admission: EM | Admit: 2022-10-17 | Discharge: 2022-10-18 | Disposition: A | Discharge status: Other Acute Inpatient Hospital | Payer: 59 | Attending: Nurse Practitioner | Admitting: Nurse Practitioner

## 2022-10-17 DIAGNOSIS — Z7984 Long term (current) use of oral hypoglycemic drugs: Secondary | ICD-10-CM | POA: Diagnosis not present

## 2022-10-17 DIAGNOSIS — E119 Type 2 diabetes mellitus without complications: Secondary | ICD-10-CM | POA: Insufficient documentation

## 2022-10-17 DIAGNOSIS — F259 Schizoaffective disorder, unspecified: Secondary | ICD-10-CM | POA: Diagnosis present

## 2022-10-17 DIAGNOSIS — F250 Schizoaffective disorder, bipolar type: Secondary | ICD-10-CM | POA: Insufficient documentation

## 2022-10-17 DIAGNOSIS — F22 Delusional disorders: Secondary | ICD-10-CM | POA: Diagnosis not present

## 2022-10-17 MED ORDER — LOSARTAN POTASSIUM 50 MG PO TABS
50.0000 mg | ORAL_TABLET | Freq: Every day | ORAL | Status: DC
  Administered 2022-10-18: 50 mg via ORAL
  Filled 2022-10-17: qty 1

## 2022-10-17 MED ORDER — DIVALPROEX SODIUM ER 500 MG PO TB24
1000.0000 mg | ORAL_TABLET | Freq: Every day | ORAL | Status: DC
  Administered 2022-10-18: 1000 mg via ORAL
  Filled 2022-10-17: qty 2

## 2022-10-17 MED ORDER — ACETAMINOPHEN 325 MG PO TABS: 650.0000 mg | ORAL_TABLET | Freq: Four times a day (QID) | ORAL | Status: DC | PRN

## 2022-10-17 MED ORDER — MAGNESIUM HYDROXIDE 400 MG/5ML PO SUSP: 30.0000 mL | Freq: Every day | ORAL | Status: DC | PRN

## 2022-10-17 MED ORDER — QUETIAPINE FUMARATE ER 300 MG PO TB24
600.0000 mg | ORAL_TABLET | Freq: Every day | ORAL | Status: DC
  Administered 2022-10-17: 600 mg via ORAL
  Filled 2022-10-17: qty 2

## 2022-10-17 MED ORDER — METOPROLOL SUCCINATE ER 25 MG PO TB24
25.0000 mg | ORAL_TABLET | Freq: Every day | ORAL | Status: DC
  Administered 2022-10-17: 25 mg via ORAL
  Filled 2022-10-17: qty 1

## 2022-10-17 MED ORDER — ALUM & MAG HYDROXIDE-SIMETH 200-200-20 MG/5ML PO SUSP: 30.0000 mL | ORAL | Status: DC | PRN

## 2022-10-17 MED ORDER — METFORMIN HCL ER 500 MG PO TB24
500.0000 mg | ORAL_TABLET | Freq: Two times a day (BID) | ORAL | Status: DC
  Administered 2022-10-18 (×2): 500 mg via ORAL
  Filled 2022-10-17 (×2): qty 1

## 2022-10-17 MED ORDER — TEMAZEPAM 15 MG PO CAPS
15.0000 mg | ORAL_CAPSULE | Freq: Every evening | ORAL | Status: DC | PRN
  Administered 2022-10-17: 15 mg via ORAL
  Filled 2022-10-17: qty 1
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
## 2022-10-17 NOTE — Progress Notes (Signed)
LCSW Progress Note  [id number]   [name] [name]  10/17/2022  11:46 PM    Inpatient Behavioral Health Placement  Pt meets inpatient criteria per Roselyn Bering, NP. There are no available beds within [facility] per [facility] Kim Brooks,RN. Referral was sent to the following facilities;   Destination  Service Provider Address Phone Fax  [facility]  [address] [place] [address] [phone]  [facility]  [address] [place] [address] [phone]  [facility]  [place] [address] [phone]  [facility]  [address] [place] [address] [phone]  [facility] [address]  [address], [place] [address] [phone]  [facility]  [address] [place], [place] [address] [phone]  [facility]  [address] [place] [address] [phone]  [facility]  [address] [place] [address] [phone]  [facility]  [address] [place] [address] [phone]  [facility]  [address] [place] [address] [phone]  [facility] EFAX  [address] [name] [place] [phone]  [facility]  [address] [place] [address] [phone]  [facility]  [address], [place] [address] [phone]  [facility]  [address] [place] [address] [phone]  [name]  [address], [place] [address] [phone]  [facility]  [address]  [place] [address] [phone]  [facility]  [address] [place] [address] [phone]  [facility]  [address] [place] [address] [phone]  [facility]  [place] [phone]  [facility]  [address] [place] [address] [phone]  [facility]  [address], [place] [address] [phone]  [facility] Patient Placement  [facility], [place] [phone]  [facility]  [address] [place], [place] [address] [phone]  [facility]  [address], [place] [address] [phone]  [facility]  [address] [place] [address] [phone]  [facility]  [place] [phone]    Situation ongoing,  CSW will follow up.    Maryjean Ka, MSW, [facility] 10/17/2022 11:46 PM

## 2022-10-17 NOTE — ED Provider Notes (Signed)
[facility] Continuous Assessment Admission H&P  Date: 10/18/22 Patient Name: [name] [name] MRN: [id number] Chief Complaint: Brought in by [employer]  Diagnoses:  Final diagnoses:  Schizoaffective disorder, bipolar type [facility])    HPI: [name] [name] is a  42 y/o female with a psychiatric history of schizoaffective disorder, bipolar type presenting tonight via [employer] for erratic behaviors at home and psychosis.  Patient presents with pressured speech, hyper religiosity and appears delusional and manic.   Nurse practitioner assessed patient face-to-face and reviewed her chart. Per chart review patient was inpatient at [facility] 09/19/22 to 09/29/22. It was very difficult to assess patient due to her ramblings and being difficult to redirect.   Patient is alert oriented x 2, speech is rambling and disjointed, thought process is disorganized, tangential, circumspect, nonlinear thinking and appears delusional and grandiose.  Patient states that she lives with 8 people and she is their landlord.  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 It is difficult to assess patient when she is rambling and tangential with each question. Patient will refer to herself as she when she is talking and will look out of the side of eye at times as if she is looking as someone else in the room. Patient then stated she was in prison on death row, but was able to get out because it was mistake. Patient reports that the Shaune Pollack talks to he and tells her what to do.  Patient reports that she lives with [name] [name] but she was not able to tell me the phone number. Patient is being followed by Dr. Maggie Schwalbe at [facility] is prescribed Depakote 1000 mg daily, Invega to 34 mg IM with next dose due 10/27/22, and Seroquel 600 mg nightly and Restoril 15 mg nightly as needed for sleep.  Patient will be admitted to [facility]  continuous observation for crisis management, stabilization and safety while inpatient bed is investigated.   Total Time spent with patient: 20 minutes  Musculoskeletal  Strength & Muscle Tone: within normal limits Gait & Station: normal Patient leans: N/A  Psychiatric Specialty Exam  Presentation General Appearance:  Casual  Eye Contact: Good  Speech: Pressured  Speech Volume: Increased  Handedness: Right   Mood and Affect  Mood: Labile; Anxious  Affect: Congruent   Thought Process  Thought Processes: Disorganized  Descriptions of Associations:Loose  Orientation:Full (Time, Place and Person)  Thought Content:Scattered; Tangential; Delusions  Diagnosis of Schizophrenia or Schizoaffective disorder in past: Yes  Duration of Psychotic Symptoms: Greater than six months  Hallucinations:Hallucinations: Auditory Description of Auditory Hallucinations: Hears the voice the [employer] of Reference:Delusions  Suicidal Thoughts:Suicidal Thoughts: No  Homicidal Thoughts:Homicidal Thoughts: No   Sensorium  Memory: Immediate Poor; Recent Poor; Remote Poor  Judgment: Impaired  Insight: Poor   Executive Functions  Concentration: Poor  Attention Span: Poor  Recall: Poor  Fund of Knowledge: Fair  Language: Fair   Psychomotor Activity  Psychomotor Activity: Psychomotor Activity: Normal   Assets  Assets: Communication Skills; Desire for Improvement; Housing; Resilience   Sleep  Sleep: Sleep: Fair Number of Hours of Sleep: 5   Nutritional Assessment (For OBS and FBC admissions only) Has the patient had a weight loss or gain of 10 pounds or more in the last 3 months?: No Has the patient had a decrease in food intake/or appetite?: No Does the patient have dental  problems?: No Does the patient have eating habits or behaviors that may be indicators of an eating disorder including binging or inducing vomiting?: No Has the patient  recently lost weight without trying?: 0 Has the patient been eating poorly because of a decreased appetite?: 0 Malnutrition Screening Tool Score: 0    Physical Exam HENT:     Head: Normocephalic and atraumatic.     Nose: Nose normal.  Eyes:     Pupils: Pupils are equal, round, and reactive to light.  Cardiovascular:     Rate and Rhythm: Normal rate.  Pulmonary:     Effort: Pulmonary effort is normal.  Abdominal:     General: Abdomen is flat.  Musculoskeletal:        General: Normal range of motion.     Cervical back: Normal range of motion.  Skin:    General: Skin is dry.  Neurological:     Mental Status: She is alert and oriented to person, place, and time.  Psychiatric:        Attention and Perception: She perceives auditory hallucinations.        Mood and Affect: Mood is anxious.        Speech: Speech is rapid and pressured and tangential.        Behavior: Behavior is cooperative.        Thought Content: Thought content is delusional. Thought content is not paranoid. Thought content does not include homicidal or suicidal ideation. Thought content does not include homicidal or suicidal plan.        Judgment: Judgment is impulsive.    Review of Systems  Constitutional: Negative.   HENT: Negative.    Eyes: Negative.   Respiratory: Negative.    Cardiovascular: Negative.   Gastrointestinal: Negative.   Genitourinary: Negative.   Musculoskeletal: Negative.   Skin: Negative.   Neurological: Negative.   Endo/Heme/Allergies: Negative.   Psychiatric/Behavioral:  The patient is nervous/anxious.     Blood pressure 120/73, pulse (!) 113, temperature 99.1 F (37.3 C), temperature source Oral, resp. rate 18, last menstrual period 09/11/2022, SpO2 98%, unknown if currently breastfeeding. There is no height or weight on file to calculate BMI.  Past Psychiatric History: .Schizoaffective bipolar type, [facility] 09/19/22 to 09/29/22 Is the patient at risk to self? No  Has the  patient been a risk to self in the past 6 months? No .    Has the patient been a risk to self within the distant past? No   Is the patient a risk to others? No   Has the patient been a risk to others in the past 6 months? No   Has the patient been a risk to others within the distant past? No   Past Medical History:  Past Medical History:  Diagnosis Date   Bipolar affective disorder, currently manic, mild (HCC)    Diabetes mellitus without complication (HCC)    Schizophrenia (HCC)      Family History:  Family History  Problem Relation Age of Onset   Drug abuse Maternal Uncle      Social History:  Social History   Socioeconomic History   Marital status: Legally Separated    Spouse name: Not on file   Number of children: Not on file   Years of education: Not on file   Highest education level: Not on file  Occupational History   Not on file  Tobacco Use   Smoking status: Never   Smokeless tobacco: Never  Vaping Use  Vaping status: Never Used  Substance and Sexual Activity   Alcohol use: Not Currently   Drug use: Not Currently   Sexual activity: Not Currently  Other Topics Concern   Not on file  Social History Narrative   ** Merged History Encounter **       ** Merged History Encounter **       Social Determinants of Health   Financial Resource Strain: Not on file  Food Insecurity: No Food Insecurity (10/05/2022)   Hunger Vital Sign    Worried About Running Out of Food in the Last Year: Never true    Ran Out of Food in the Last Year: Never true  Transportation Needs: No Transportation Needs (10/05/2022)   PRAPARE - Administrator, Civil Service (Medical): No    Lack of Transportation (Non-Medical): No  Recent Concern: Transportation Needs - Unmet Transportation Needs (09/29/2022)   PRAPARE - Administrator, Civil Service (Medical): Yes    Lack of Transportation (Non-Medical): Yes  Physical Activity: Not on file  Stress: Not on file   Social Connections: Not on file  Intimate Partner Violence: Not At Risk (10/05/2022)   Humiliation, Afraid, Rape, and Kick questionnaire    Fear of Current or Ex-Partner: No    Emotionally Abused: No    Physically Abused: No    Sexually Abused: No     Last Labs:  Admission on 10/17/2022  Component Date Value Ref Range Status   WBC 10/17/2022 8.4  4.0 - 10.5 K/uL Final   RBC 10/17/2022 3.57 (L)  3.87 - 5.11 MIL/uL Final   Hemoglobin 10/17/2022 10.8 (L)  12.0 - 15.0 g/dL Final   HCT 56/21/3086 31.9 (L)  36.0 - 46.0 % Final   MCV 10/17/2022 89.4  80.0 - 100.0 fL Final   MCH 10/17/2022 30.3  26.0 - 34.0 pg Final   MCHC 10/17/2022 33.9  30.0 - 36.0 g/dL Final   RDW 57/84/6962 14.0  11.5 - 15.5 % Final   Platelets 10/17/2022 185  150 - 400 K/uL Final   nRBC 10/17/2022 0.0  0.0 - 0.2 % Final   Neutrophils Relative % 10/17/2022 66  % Final   Neutro Abs 10/17/2022 5.5  1.7 - 7.7 K/uL Final   Lymphocytes Relative 10/17/2022 23  % Final   Lymphs Abs 10/17/2022 1.9  0.7 - 4.0 K/uL Final   Monocytes Relative 10/17/2022 11  % Final   Monocytes Absolute 10/17/2022 0.9  0.1 - 1.0 K/uL Final   Eosinophils Relative 10/17/2022 0  % Final   Eosinophils Absolute 10/17/2022 0.0  0.0 - 0.5 K/uL Final   Basophils Relative 10/17/2022 0  % Final   Basophils Absolute 10/17/2022 0.0  0.0 - 0.1 K/uL Final   Immature Granulocytes 10/17/2022 0  % Final   Abs Immature Granulocytes 10/17/2022 0.02  0.00 - 0.07 K/uL Final   Performed at [facility], [address] [place], [place] [address]   Sodium 10/17/2022 138  135 - 145 mmol/L Final   Potassium 10/17/2022 4.0  3.5 - 5.1 mmol/L Final   Chloride 10/17/2022 102  98 - 111 mmol/L Final   CO2 10/17/2022 25  22 - 32 mmol/L Final   Glucose, Bld 10/17/2022 145 (H)  70 - 99 mg/dL Final   Glucose reference range applies only to samples taken after fasting for at least 8 hours.   BUN 10/17/2022 19  6 - 20 mg/dL Final   Creatinine, Ser 10/17/2022 0.80  0.44  - 1.00 mg/dL Final   Calcium 16/10/9602 9.3  8.9 - 10.3 mg/dL Final   Total Protein 54/09/8117 8.0  6.5 - 8.1 g/dL Final   Albumin 14/78/2956 3.8  3.5 - 5.0 g/dL Final   AST 21/30/8657 31  15 - 41 U/L Final   ALT 10/17/2022 21  0 - 44 U/L Final   Alkaline Phosphatase 10/17/2022 44  38 - 126 U/L Final   Total Bilirubin 10/17/2022 0.5  0.3 - 1.2 mg/dL Final   GFR, Estimated 10/17/2022 >60  >60 mL/min Final   Comment: (NOTE) Calculated using the CKD-EPI Creatinine Equation (2021)    Anion gap 10/17/2022 11  5 - 15 Final   Performed at [facility], [address] [place], [place] [address]   Valproic Acid Lvl 10/17/2022 45 (L)  50.0 - 100.0 ug/mL Final   Performed at [facility], [address] [place], [place] [address]   TSH 10/17/2022 2.837  0.350 - 4.500 uIU/mL Final   Comment: Performed by a 3rd Generation assay with a functional sensitivity of <=0.01 uIU/mL. Performed at [facility], [address], [place], [place] [address]   Admission on 10/05/2022, Discharged on 10/05/2022  Component Date Value Ref Range Status   WBC 10/05/2022 5.7  4.0 - 10.5 K/uL Final   RBC 10/05/2022 3.53 (L)  3.87 - 5.11 MIL/uL Final   Hemoglobin 10/05/2022 10.8 (L)  12.0 - 15.0 g/dL Final   HCT 40/10/2723 32.3 (L)  36.0 - 46.0 % Final   MCV 10/05/2022 91.5  80.0 - 100.0 fL Final   MCH 10/05/2022 30.6  26.0 - 34.0 pg Final   MCHC 10/05/2022 33.4  30.0 - 36.0 g/dL Final   RDW 36/64/4034 13.9  11.5 - 15.5 % Final   Platelets 10/05/2022 233  150 - 400 K/uL Final   nRBC 10/05/2022 0.0  0.0 - 0.2 % Final   Neutrophils Relative % 10/05/2022 56  % Final   Neutro Abs 10/05/2022 3.2  1.7 - 7.7 K/uL Final   Lymphocytes Relative 10/05/2022 33  % Final   Lymphs Abs 10/05/2022 1.9  0.7 - 4.0 K/uL Final   Monocytes Relative 10/05/2022 10  % Final   Monocytes Absolute 10/05/2022 0.6  0.1 - 1.0 K/uL Final   Eosinophils Relative 10/05/2022 1  % Final   Eosinophils Absolute 10/05/2022 0.0  0.0 -  0.5 K/uL Final   Basophils Relative 10/05/2022 0  % Final   Basophils Absolute 10/05/2022 0.0  0.0 - 0.1 K/uL Final   Immature Granulocytes 10/05/2022 0  % Final   Abs Immature Granulocytes 10/05/2022 0.01  0.00 - 0.07 K/uL Final   Performed at [facility], [address], [place], [place] [address]   Sodium 10/05/2022 139  135 - 145 mmol/L Final   Potassium 10/05/2022 4.1  3.5 - 5.1 mmol/L Final   Chloride 10/05/2022 105  98 - 111 mmol/L Final   CO2 10/05/2022 25  22 - 32 mmol/L Final   Glucose, Bld 10/05/2022 113 (H)  70 - 99 mg/dL Final   Glucose reference range applies only to samples taken after fasting for at least 8 hours.   BUN 10/05/2022 8  6 - 20 mg/dL Final   Creatinine, Ser 10/05/2022 0.79  0.44 - 1.00 mg/dL Final   Calcium 56/38/7564 8.9  8.9 - 10.3 mg/dL Final   Total Protein 33/29/5188 7.0  6.5 - 8.1 g/dL Final   Albumin 41/66/0630 3.4 (  L)  3.5 - 5.0 g/dL Final   AST 16/10/9602 20  15 - 41 U/L Final   ALT 10/05/2022 19  0 - 44 U/L Final   Alkaline Phosphatase 10/05/2022 42  38 - 126 U/L Final   Total Bilirubin 10/05/2022 0.3  0.3 - 1.2 mg/dL Final   GFR, Estimated 10/05/2022 >60  >60 mL/min Final   Comment: (NOTE) Calculated using the CKD-EPI Creatinine Equation (2021)    Anion gap 10/05/2022 9  5 - 15 Final   Performed at [facility], [address], [place], [place] [address]   Magnesium 10/05/2022 1.9  1.7 - 2.4 mg/dL Final   Performed at [facility], [address], [place], [place] [address]   Alcohol, Ethyl (B) 10/05/2022 <10  <10 mg/dL Final   Comment: (NOTE) Lowest detectable limit for serum alcohol is 10 mg/dL.  For medical purposes only. Performed at [facility], [address] [place], [place] [address]    TSH 10/05/2022 0.963  0.350 - 4.500 uIU/mL Final   Comment: Performed by a 3rd Generation assay with a functional sensitivity of <=0.01 uIU/mL. Performed at [facility], [address] [place], [place] [address]     Prolactin 10/05/2022 76.2 (H)  4.8 - 33.4 ng/mL Final   Comment: (NOTE) Performed At: [facility] [address] [place], [place] [id number] Jolene Schimke MD EX:[id number]    Preg Test, Ur 10/05/2022 Negative  Negative Final   POC Amphetamine UR 10/05/2022 None Detected  NONE DETECTED (Cut Off Level 1000 ng/mL) Final   POC Secobarbital (BAR) 10/05/2022 None Detected  NONE DETECTED (Cut Off Level 300 ng/mL) Final   POC Buprenorphine (BUP) 10/05/2022 None Detected  NONE DETECTED (Cut Off Level 10 ng/mL) Final   POC Oxazepam (BZO) 10/05/2022 Positive (A)  NONE DETECTED (Cut Off Level 300 ng/mL) Final   POC Cocaine UR 10/05/2022 None Detected  NONE DETECTED (Cut Off Level 300 ng/mL) Final   POC Methamphetamine UR 10/05/2022 None Detected  NONE DETECTED (Cut Off Level 1000 ng/mL) Final   POC Morphine 10/05/2022 None Detected  NONE DETECTED (Cut Off Level 300 ng/mL) Final   POC Methadone UR 10/05/2022 None Detected  NONE DETECTED (Cut Off Level 300 ng/mL) Final   POC Oxycodone UR 10/05/2022 None Detected  NONE DETECTED (Cut Off Level 100 ng/mL) Final   POC Marijuana UR 10/05/2022 None Detected  NONE DETECTED (Cut Off Level 50 ng/mL) Final   Valproic Acid Lvl 10/05/2022 71  50.0 - 100.0 ug/mL Final   Performed at [facility], [address], [place], [place] [address]   Preg Test, Ur 10/05/2022 NEGATIVE  NEGATIVE Final   Comment:        THE SENSITIVITY OF THIS METHODOLOGY IS >24 mIU/mL   Admission on 09/19/2022, Discharged on 09/29/2022  Component Date Value Ref Range Status   Glucose-Capillary 09/20/2022 123 (H)  70 - 99 mg/dL Final   Glucose reference range applies only to samples taken after fasting for at least 8 hours.   Glucose-Capillary 09/21/2022 124 (H)  70 - 99 mg/dL Final   Glucose reference range applies only to samples taken after fasting for at least 8 hours.   Glucose-Capillary 09/21/2022 152 (H)  70 - 99 mg/dL Final   Glucose reference range applies only to  samples taken after fasting for at least 8 hours.   Glucose-Capillary 09/23/2022 131 (H)  70 - 99 mg/dL Final   Glucose reference range applies  only to samples taken after fasting for at least 8 hours.   Glucose-Capillary 09/23/2022 160 (H)  70 - 99 mg/dL Final   Glucose reference range applies only to samples taken after fasting for at least 8 hours.   Cholesterol 09/23/2022 163  0 - 200 mg/dL Final   Triglycerides 16/10/9602 97  <150 mg/dL Final   HDL 54/09/8117 52  >40 mg/dL Final   Total CHOL/HDL Ratio 09/23/2022 3.1  RATIO Final   VLDL 09/23/2022 19  0 - 40 mg/dL Final   LDL Cholesterol 09/23/2022 92  0 - 99 mg/dL Final   Comment:        Total Cholesterol/HDL:CHD Risk Coronary Heart Disease Risk Table                     Men   Women  1/2 Average Risk   3.4   3.3  Average Risk       5.0   4.4  2 X Average Risk   9.6   7.1  3 X Average Risk  23.4   11.0        Use the calculated Patient Ratio above and the CHD Risk Table to determine the patient's CHD Risk.        ATP III CLASSIFICATION (LDL):  <100     mg/dL   Optimal  147-829  mg/dL   Near or Above                    Optimal  130-159  mg/dL   Borderline  562-130  mg/dL   High  >865     mg/dL   Very High Performed at [facility], [address], [place], [place] [address]    TSH 09/23/2022 0.224 (L)  0.350 - 4.500 uIU/mL Final   Comment: Performed by a 3rd Generation assay with a functional sensitivity of <=0.01 uIU/mL. Performed at [facility], [address] [place], [place] [address]    Glucose-Capillary 09/23/2022 141 (H)  70 - 99 mg/dL Final   Glucose reference range applies only to samples taken after fasting for at least 8 hours.   Glucose-Capillary 09/23/2022 135 (H)  70 - 99 mg/dL Final   Glucose reference range applies only to samples taken after fasting for at least 8 hours.   Glucose-Capillary 09/24/2022 126 (H)  70 - 99 mg/dL Final   Glucose reference range applies  only to samples taken after fasting for at least 8 hours.   Glucose-Capillary 09/24/2022 107 (H)  70 - 99 mg/dL Final   Glucose reference range applies only to samples taken after fasting for at least 8 hours.   Glucose-Capillary 09/25/2022 158 (H)  70 - 99 mg/dL Final   Glucose reference range applies only to samples taken after fasting for at least 8 hours.   WBC 09/26/2022 5.6  4.0 - 10.5 K/uL Final   RBC 09/26/2022 3.69 (L)  3.87 - 5.11 MIL/uL Final   Hemoglobin 09/26/2022 11.5 (L)  12.0 - 15.0 g/dL Final   HCT 84/13/2440 34.8 (L)  36.0 - 46.0 % Final   MCV 09/26/2022 94.3  80.0 - 100.0 fL Final   MCH 09/26/2022 31.2  26.0 - 34.0 pg Final   MCHC 09/26/2022 33.0  30.0 - 36.0 g/dL Final   RDW 11/02/2534 13.9  11.5 - 15.5 % Final   Platelets 09/26/2022 187  150 - 400 K/uL Final   nRBC 09/26/2022 0.0  0.0 - 0.2 %  Final   Neutrophils Relative % 09/26/2022 49  % Final   Neutro Abs 09/26/2022 2.8  1.7 - 7.7 K/uL Final   Lymphocytes Relative 09/26/2022 39  % Final   Lymphs Abs 09/26/2022 2.2  0.7 - 4.0 K/uL Final   Monocytes Relative 09/26/2022 10  % Final   Monocytes Absolute 09/26/2022 0.5  0.1 - 1.0 K/uL Final   Eosinophils Relative 09/26/2022 2  % Final   Eosinophils Absolute 09/26/2022 0.1  0.0 - 0.5 K/uL Final   Basophils Relative 09/26/2022 0  % Final   Basophils Absolute 09/26/2022 0.0  0.0 - 0.1 K/uL Final   Immature Granulocytes 09/26/2022 0  % Final   Abs Immature Granulocytes 09/26/2022 0.01  0.00 - 0.07 K/uL Final   Performed at [facility], [address] [place], [place] [address]   Glucose-Capillary 09/25/2022 123 (H)  70 - 99 mg/dL Final   Glucose reference range applies only to samples taken after fasting for at least 8 hours.   Glucose-Capillary 09/25/2022 82  70 - 99 mg/dL Final   Glucose reference range applies only to samples taken after fasting for at least 8 hours.   Glucose-Capillary 09/25/2022 136 (H)  70 - 99 mg/dL Final   Glucose reference  range applies only to samples taken after fasting for at least 8 hours.   Glucose-Capillary 09/26/2022 128 (H)  70 - 99 mg/dL Final   Glucose reference range applies only to samples taken after fasting for at least 8 hours.   Glucose-Capillary 09/26/2022 143 (H)  70 - 99 mg/dL Final   Glucose reference range applies only to samples taken after fasting for at least 8 hours.   Valproic Acid Lvl 09/27/2022 92  50.0 - 100.0 ug/mL Final   Performed at [facility], [address] [place], [place] [address]   Free T4 09/27/2022 0.71  0.61 - 1.12 ng/dL Final   Comment: (NOTE) Biotin ingestion may interfere with free T4 tests. If the results are inconsistent with the TSH level, previous test results, or the clinical presentation, then consider biotin interference. If needed, order repeat testing after stopping biotin. Performed at [facility], [address], [place], [place] [address]    T3, Total 09/27/2022 86  71 - 180 ng/dL Final   Comment: (NOTE) Performed At: [facility] [address] [place], [place] [id number] Jolene Schimke MD UX:[id number]    TSH 09/27/2022 1.818  0.350 - 4.500 uIU/mL Final   Comment: Performed by a 3rd Generation assay with a functional sensitivity of <=0.01 uIU/mL. Performed at [facility], [address], [place], [place] [address]    Glucose-Capillary 09/26/2022 141 (H)  70 - 99 mg/dL Final   Glucose reference range applies only to samples taken after fasting for at least 8 hours.   Comment 1 09/26/2022 Notify RN   Final   Glucose-Capillary 09/27/2022 142 (H)  70 - 99 mg/dL Final   Glucose reference range applies only to samples taken after fasting for at least 8 hours.   Comment 1 09/27/2022 Notify RN   Final   Glucose-Capillary 09/27/2022 167 (H)  70 - 99 mg/dL Final   Glucose reference range applies only to samples taken after fasting for at least 8 hours.   Comment 1 09/27/2022 Notify RN   Final    Comment 2 09/27/2022 Document in Chart   Final   Glucose-Capillary 09/27/2022 112 (H)  70 - 99 mg/dL Final   Glucose  reference range applies only to samples taken after fasting for at least 8 hours.   Glucose-Capillary 09/28/2022 133 (H)  70 - 99 mg/dL Final   Glucose reference range applies only to samples taken after fasting for at least 8 hours.   Glucose-Capillary 09/28/2022 112 (H)  70 - 99 mg/dL Final   Glucose reference range applies only to samples taken after fasting for at least 8 hours.   Glucose-Capillary 09/28/2022 133 (H)  70 - 99 mg/dL Final   Glucose reference range applies only to samples taken after fasting for at least 8 hours.   Glucose-Capillary 09/28/2022 87  70 - 99 mg/dL Final   Glucose reference range applies only to samples taken after fasting for at least 8 hours.   Glucose-Capillary 09/29/2022 118 (H)  70 - 99 mg/dL Final   Glucose reference range applies only to samples taken after fasting for at least 8 hours.  Admission on 09/18/2022, Discharged on 09/19/2022  Component Date Value Ref Range Status   Sodium 09/18/2022 139  135 - 145 mmol/L Final   Potassium 09/18/2022 3.5  3.5 - 5.1 mmol/L Final   Chloride 09/18/2022 104  98 - 111 mmol/L Final   CO2 09/18/2022 23  22 - 32 mmol/L Final   Glucose, Bld 09/18/2022 147 (H)  70 - 99 mg/dL Final   Glucose reference range applies only to samples taken after fasting for at least 8 hours.   BUN 09/18/2022 10  6 - 20 mg/dL Final   Creatinine, Ser 09/18/2022 0.80  0.44 - 1.00 mg/dL Final   Calcium 60/10/9321 9.6  8.9 - 10.3 mg/dL Final   Total Protein 55/73/2202 8.7 (H)  6.5 - 8.1 g/dL Final   Albumin 54/27/0623 4.3  3.5 - 5.0 g/dL Final   AST 76/28/3151 17  15 - 41 U/L Final   ALT 09/18/2022 13  0 - 44 U/L Final   Alkaline Phosphatase 09/18/2022 43  38 - 126 U/L Final   Total Bilirubin 09/18/2022 0.7  0.3 - 1.2 mg/dL Final   GFR, Estimated 09/18/2022 >60  >60 mL/min Final   Comment: (NOTE) Calculated using the  CKD-EPI Creatinine Equation (2021)    Anion gap 09/18/2022 12  5 - 15 Final   Performed at [facility], [address] [place], [place] [address]   Alcohol, Ethyl (B) 09/18/2022 <10  <10 mg/dL Final   Comment: (NOTE) Lowest detectable limit for serum alcohol is 10 mg/dL.  For medical purposes only. Performed at [facility], [address] [place], [place] [address]    Salicylate Lvl 09/18/2022 <7.0 (L)  7.0 - 30.0 mg/dL Final   Performed at [facility], [address], [place], [place] [address]   Acetaminophen (Tylenol), Serum 09/18/2022 <10 (L)  10 - 30 ug/mL Final   Comment: (NOTE) Therapeutic concentrations vary significantly. A range of 10-30 ug/mL  may be an effective concentration for many patients. However, some  are best treated at concentrations outside of this range. Acetaminophen concentrations >150 ug/mL at 4 hours after ingestion  and >50 ug/mL at 12 hours after ingestion are often associated with  toxic reactions.  Performed at [facility], [address] [place], [place] [address]    WBC 09/18/2022 7.0  4.0 - 10.5 K/uL Final   RBC 09/18/2022 3.93  3.87 - 5.11 MIL/uL Final   Hemoglobin 09/18/2022 12.1  12.0 - 15.0 g/dL Final   HCT 70/35/0093 35.9 (L)  36.0 -  46.0 % Final   MCV 09/18/2022 91.3  80.0 - 100.0 fL Final   MCH 09/18/2022 30.8  26.0 - 34.0 pg Final   MCHC 09/18/2022 33.7  30.0 - 36.0 g/dL Final   RDW 66/59/9357 13.5  11.5 - 15.5 % Final   Platelets 09/18/2022 181  150 - 400 K/uL Final   nRBC 09/18/2022 0.0  0.0 - 0.2 % Final   Performed at [facility], [address], [place], [place] [address]   Opiates 09/18/2022 NONE DETECTED  NONE DETECTED Final   Cocaine 09/18/2022 NONE DETECTED  NONE DETECTED Final   Benzodiazepines 09/18/2022 NONE DETECTED  NONE DETECTED Final   Amphetamines 09/18/2022 NONE DETECTED  NONE DETECTED Final   Tetrahydrocannabinol  09/18/2022 NONE DETECTED  NONE DETECTED Final   Barbiturates 09/18/2022 NONE DETECTED  NONE DETECTED Final   Comment: (NOTE) DRUG SCREEN FOR MEDICAL PURPOSES ONLY.  IF CONFIRMATION IS NEEDED FOR ANY PURPOSE, NOTIFY LAB WITHIN 5 DAYS.  LOWEST DETECTABLE LIMITS FOR URINE DRUG SCREEN Drug Class                     Cutoff (ng/mL) Amphetamine and metabolites    1000 Barbiturate and metabolites    200 Benzodiazepine                 200 Opiates and metabolites        300 Cocaine and metabolites        300 THC                            50 Performed at [facility], [address], [place], [place] [address]    Preg, Serum 09/18/2022 NEGATIVE  NEGATIVE Final   Comment:        THE SENSITIVITY OF THIS METHODOLOGY IS >10 mIU/mL. Performed at [facility], [address], [place], [place] [address]    Yeast Wet Prep HPF POC 09/18/2022 NONE SEEN  NONE SEEN Final   Trich, Wet Prep 09/18/2022 NONE SEEN  NONE SEEN Final   Clue Cells Wet Prep HPF POC 09/18/2022 NONE SEEN  NONE SEEN Final   WBC, Wet Prep HPF POC 09/18/2022 <10  <10 Final   Sperm 09/18/2022 NONE SEEN   Final   Performed at [facility], [address], [place], [place] [address]   Neisseria Gonorrhea 09/18/2022 Negative   Final   Chlamydia 09/18/2022 Negative   Final   Comment 09/18/2022 Normal Reference Ranger Chlamydia - Negative   Final   Comment 09/18/2022 Normal Reference Range Neisseria Gonorrhea - Negative   Final   HIV Screen 4th Generation wRfx 09/19/2022 Non Reactive  Non Reactive Final   Performed at [facility], [address], [place], [place] [address]   Hgb A1c MFr Bld 09/18/2022 6.9 (H)  4.8 - 5.6 % Final   Comment: (NOTE)         Prediabetes: 5.7 - 6.4         Diabetes: >6.4         Glycemic control for adults with diabetes: <7.0    Mean Plasma Glucose 09/18/2022 151  mg/dL Final   Comment: (NOTE) Performed At: [facility] [address] [place], [place] [id number] Jolene Schimke MD LH:[phone]    Preg Test, Ur 09/19/2022 NEGATIVE  NEGATIVE Final   Comment:        THE SENSITIVITY OF THIS  METHODOLOGY IS >25 mIU/mL. Performed at [facility], [address] [place], [place] [address]    Color, Urine 09/19/2022 YELLOW  YELLOW Final   APPearance 09/19/2022 HAZY (A)  CLEAR Final   Specific Gravity, Urine 09/19/2022 1.010  1.005 - 1.030 Final   pH 09/19/2022 5.0  5.0 - 8.0 Final   Glucose, UA 09/19/2022 NEGATIVE  NEGATIVE mg/dL Final   Hgb urine dipstick 09/19/2022 NEGATIVE  NEGATIVE Final   Bilirubin Urine 09/19/2022 NEGATIVE  NEGATIVE Final   Ketones, ur 09/19/2022 5 (A)  NEGATIVE mg/dL Final   Protein, ur 60/45/4098 NEGATIVE  NEGATIVE mg/dL Final   Nitrite 11/91/4782 NEGATIVE  NEGATIVE Final   Leukocytes,Ua 09/19/2022 SMALL (A)  NEGATIVE Final   RBC / HPF 09/19/2022 0-5  0 - 5 RBC/hpf Final   WBC, UA 09/19/2022 0-5  0 - 5 WBC/hpf Final   Bacteria, UA 09/19/2022 NONE SEEN  NONE SEEN Final   Squamous Epithelial / HPF 09/19/2022 0-5  0 - 5 /HPF Final   Mucus 09/19/2022 PRESENT   Final   Hyaline Casts, UA 09/19/2022 PRESENT   Final   Performed at [facility], [address], [place], [place] [address]   Glucose-Capillary 09/19/2022 107 (H)  70 - 99 mg/dL Final   Glucose reference range applies only to samples taken after fasting for at least 8 hours.   Glucose-Capillary 09/19/2022 123 (H)  70 - 99 mg/dL Final   Glucose reference range applies only to samples taken after fasting for at least 8 hours.   Glucose-Capillary 09/19/2022 98  70 - 99 mg/dL Final   Glucose reference range applies only to samples taken after fasting for at least 8 hours.   Glucose-Capillary 09/19/2022 134 (H)  70 - 99 mg/dL Final   Glucose reference range applies only to samples taken after fasting for at least 8 hours.  Admission on 08/04/2022, Discharged on 08/06/2022  Component Date Value Ref Range  Status   Sodium 08/04/2022 139  135 - 145 mmol/L Final   Potassium 08/04/2022 3.5  3.5 - 5.1 mmol/L Final   Chloride 08/04/2022 101  98 - 111 mmol/L Final   CO2 08/04/2022 25  22 - 32 mmol/L Final   Glucose, Bld 08/04/2022 91  70 - 99 mg/dL Final   Glucose reference range applies only to samples taken after fasting for at least 8 hours.   BUN 08/04/2022 11  6 - 20 mg/dL Final   Creatinine, Ser 08/04/2022 0.95  0.44 - 1.00 mg/dL Final   Calcium 30/86/5784 9.1  8.9 - 10.3 mg/dL Final   Total Protein 69/62/9528 7.4  6.5 - 8.1 g/dL Final   Albumin 41/32/4401 3.7  3.5 - 5.0 g/dL Final   AST 02/72/5366 29  15 - 41 U/L Final   ALT 08/04/2022 17  0 - 44 U/L Final   Alkaline Phosphatase 08/04/2022 43  38 - 126 U/L Final   Total Bilirubin 08/04/2022 1.1  0.3 - 1.2 mg/dL Final   GFR, Estimated 08/04/2022 >60  >60 mL/min Final   Comment: (NOTE) Calculated using the CKD-EPI Creatinine Equation (2021)    Anion gap 08/04/2022 13  5 - 15 Final   Performed at [facility], [address], [place], [place] [address]   Alcohol, Ethyl (B) 08/04/2022 <10  <10 mg/dL Final   Comment: (NOTE) Lowest detectable limit for serum alcohol is 10 mg/dL.  For medical purposes only. Performed at [facility], [address], [place], [place] [address]    Opiates 08/06/2022 NONE DETECTED  NONE DETECTED Final   Cocaine 08/06/2022 NONE DETECTED  NONE DETECTED Final   Benzodiazepines 08/06/2022 POSITIVE (A)  NONE DETECTED Final   Amphetamines 08/06/2022 NONE DETECTED  NONE DETECTED Final   Tetrahydrocannabinol 08/06/2022 NONE DETECTED  NONE DETECTED Final   Barbiturates 08/06/2022 NONE DETECTED  NONE DETECTED Final   Comment: (NOTE) DRUG SCREEN FOR MEDICAL PURPOSES ONLY.  IF CONFIRMATION IS NEEDED FOR ANY PURPOSE, NOTIFY LAB WITHIN 5 DAYS.  LOWEST DETECTABLE LIMITS FOR URINE DRUG SCREEN Drug Class                     Cutoff (ng/mL) Amphetamine and metabolites    1000 Barbiturate and metabolites     200 Benzodiazepine                 200 Opiates and metabolites        300 Cocaine and metabolites        300 THC                            50 Performed at [facility], [address], [place], [place] [address]    WBC 08/04/2022 6.3  4.0 - 10.5 K/uL Final   RBC 08/04/2022 3.47 (L)  3.87 - 5.11 MIL/uL Final   Hemoglobin 08/04/2022 10.6 (L)  12.0 - 15.0 g/dL Final   HCT 09/81/1914 32.5 (L)  36.0 - 46.0 % Final   MCV 08/04/2022 93.7  80.0 - 100.0 fL Final   MCH 08/04/2022 30.5  26.0 - 34.0 pg Final   MCHC 08/04/2022 32.6  30.0 - 36.0 g/dL Final   RDW 78/29/5621 13.6  11.5 - 15.5 % Final   Platelets 08/04/2022 191  150 - 400 K/uL Final   nRBC 08/04/2022 0.0  0.0 - 0.2 % Final   Neutrophils Relative % 08/04/2022 53  % Final   Neutro Abs 08/04/2022 3.3  1.7 - 7.7 K/uL Final   Lymphocytes Relative 08/04/2022 30  % Final   Lymphs Abs 08/04/2022 1.9  0.7 - 4.0 K/uL Final   Monocytes Relative 08/04/2022 16  % Final   Monocytes Absolute 08/04/2022 1.0  0.1 - 1.0 K/uL Final   Eosinophils Relative 08/04/2022 1  % Final   Eosinophils Absolute 08/04/2022 0.0  0.0 - 0.5 K/uL Final   Basophils Relative 08/04/2022 0  % Final   Basophils Absolute 08/04/2022 0.0  0.0 - 0.1 K/uL Final   Immature Granulocytes 08/04/2022 0  % Final   Abs Immature Granulocytes 08/04/2022 0.01  0.00 - 0.07 K/uL Final   Performed at [facility], [address] [place], [place] [address]   Preg, Serum 08/04/2022 NEGATIVE  NEGATIVE Final   Comment:        THE SENSITIVITY OF THIS METHODOLOGY IS >10 mIU/mL. Performed at [facility], [address], [place], [place] [address]    Salicylate Lvl 08/04/2022 <7.0 (L)  7.0 - 30.0 mg/dL Final   Performed at [facility], [address] [place], [place] [address]   Acetaminophen (Tylenol), Serum 08/04/2022 <10 (L)  10 - 30 ug/mL Final   Comment: (NOTE) Therapeutic concentrations vary significantly. A range of 10-30 ug/mL  may be an effective  concentration for many patients. However, some  are best treated at concentrations outside of this range. Acetaminophen concentrations >150 ug/mL at 4 hours after  ingestion  and >50 ug/mL at 12 hours after ingestion are often associated with  toxic reactions.  Performed at [facility], [address], [place], [place] [address]    Valproic Acid Lvl 08/04/2022 34 (L)  50.0 - 100.0 ug/mL Final   Performed at [facility], [address], [place], [place] [address]   TSH 08/04/2022 0.489  0.350 - 4.500 uIU/mL Final   Comment: Performed by a 3rd Generation assay with a functional sensitivity of <=0.01 uIU/mL. Performed at [facility], [address] [place], [place] [address]    Free T4 08/04/2022 1.07  0.61 - 1.12 ng/dL Final   Comment: (NOTE) Biotin ingestion may interfere with free T4 tests. If the results are inconsistent with the TSH level, previous test results, or the clinical presentation, then consider biotin interference. If needed, order repeat testing after stopping biotin. Performed at [facility], [address] [place], [place] [address]   Admission on 07/24/2022, Discharged on 07/24/2022  Component Date Value Ref Range Status   Sodium 07/24/2022 133 (L)  135 - 145 mmol/L Final   Potassium 07/24/2022 3.5  3.5 - 5.1 mmol/L Final   Chloride 07/24/2022 102  98 - 111 mmol/L Final   CO2 07/24/2022 23  22 - 32 mmol/L Final   Glucose, Bld 07/24/2022 130 (H)  70 - 99 mg/dL Final   Glucose reference range applies only to samples taken after fasting for at least 8 hours.   BUN 07/24/2022 8  6 - 20 mg/dL Final   Creatinine, Ser 07/24/2022 0.90  0.44 - 1.00 mg/dL Final   Calcium 13/08/6576 9.0  8.9 - 10.3 mg/dL Final   Total Protein 46/96/2952 7.7  6.5 - 8.1 g/dL Final   Albumin 84/13/2440 3.8  3.5 - 5.0 g/dL Final   AST 11/02/2534 15  15 - 41 U/L Final   ALT 07/24/2022 12  0 - 44 U/L Final   Alkaline Phosphatase 07/24/2022 39  38 - 126 U/L Final    Total Bilirubin 07/24/2022 0.7  0.3 - 1.2 mg/dL Final   GFR, Estimated 07/24/2022 >60  >60 mL/min Final   Comment: (NOTE) Calculated using the CKD-EPI Creatinine Equation (2021)    Anion gap 07/24/2022 8  5 - 15 Final   Performed at [facility], [address], [place], [place] [address]   Alcohol, Ethyl (B) 07/24/2022 <10  <10 mg/dL Final   Comment: (NOTE) Lowest detectable limit for serum alcohol is 10 mg/dL.  For medical purposes only. Performed at [facility], [address], [place], [place] [address]    Salicylate Lvl 07/24/2022 <7.0 (L)  7.0 - 30.0 mg/dL Final   Performed at [facility], [address] [place], [place] [address]   Acetaminophen (Tylenol), Serum 07/24/2022 <10 (L)  10 - 30 ug/mL Final   Comment: (NOTE) Therapeutic concentrations vary significantly. A range of 10-30 ug/mL  may be an effective concentration for many patients. However, some  are best treated at concentrations outside of this range. Acetaminophen concentrations >150 ug/mL at 4 hours after ingestion  and >50 ug/mL at 12 hours after ingestion are often associated with  toxic reactions.  Performed at [facility], [address], [place], [place] [address]    WBC 07/24/2022 4.0  4.0 - 10.5 K/uL Final   RBC 07/24/2022 3.94  3.87 - 5.11 MIL/uL Final   Hemoglobin 07/24/2022 12.6  12.0 - 15.0 g/dL  Final   HCT 07/24/2022 37.0  36.0 - 46.0 % Final   MCV 07/24/2022 93.9  80.0 - 100.0 fL Final   MCH 07/24/2022 32.0  26.0 - 34.0 pg Final   MCHC 07/24/2022 34.1  30.0 - 36.0 g/dL Final   RDW 86/57/8469 13.3  11.5 - 15.5 % Final   Platelets 07/24/2022 195  150 - 400 K/uL Final   nRBC 07/24/2022 0.0  0.0 - 0.2 % Final   Performed at [facility], [address], [place], [place] [address]   Preg, Serum 07/24/2022 NEGATIVE  NEGATIVE Final   Comment:        THE SENSITIVITY OF THIS METHODOLOGY IS >10 mIU/mL. Performed at [facility], [address],  [place], [place] [address]     Allergies: Neurontin [gabapentin], Trazodone and nefazodone, Fluphenazine, and Haldol [haloperidol]  Medications:  Facility Ordered Medications  Medication   acetaminophen (TYLENOL) tablet 650 mg   alum & mag hydroxide-simeth (MAALOX/MYLANTA) 200-200-20 MG/5ML suspension 30 mL   magnesium hydroxide (MILK OF MAGNESIA) suspension 30 mL   losartan (COZAAR) tablet 50 mg   metFORMIN (GLUCOPHAGE-XR) 24 hr tablet 500 mg   metoprolol succinate (TOPROL-XL) 24 hr tablet 25 mg   QUEtiapine (SEROQUEL XR) 24 hr tablet 600 mg   temazepam (RESTORIL) capsule 15 mg   divalproex (DEPAKOTE ER) 24 hr tablet 1,000 mg   PTA Medications  Medication Sig   losartan (COZAAR) 50 MG tablet Take 1 tablet (50 mg total) by mouth daily.   metoprolol succinate (TOPROL-XL) 25 MG 24 hr tablet Take 1 tablet (25 mg total) by mouth at bedtime.   QUEtiapine (SEROQUEL XR) 300 MG 24 hr tablet Take 2 tablets (600 mg total) by mouth at bedtime.   temazepam (RESTORIL) 15 MG capsule Take 1 capsule (15 mg total) by mouth at bedtime as needed for sleep. (Patient not taking: Reported on 10/05/2022)   metFORMIN (GLUCOPHAGE-XR) 500 MG 24 hr tablet Take 1 tablet (500 mg total) by mouth 2 (two) times daily with a meal.   divalproex (DEPAKOTE ER) 500 MG 24 hr tablet Take 2 tablets (1,000 mg total) by mouth daily.   [START ON 10/27/2022] paliperidone (INVEGA SUSTENNA) 234 MG/1.5ML injection Inject 234 mg into the muscle once for 1 dose. Administer on 10-27-22. Patient received last dose on 09-27-22   hydrOXYzine (ATARAX) 25 MG tablet Take 1 tablet (25 mg total) by mouth 3 (three) times daily as needed for anxiety.      Medical Decision Making  Vondell Sowell is a  42 y/o female with a psychiatric history of schizoaffective disorder, bipolar type presenting tonight via [facility] for erractic behaviors at home and psychosis.  Patient presents with pressured speech, hyper religiosity and appears delusional and  manic.     Recommendations  Based on my evaluation the patient does not appear to have an emergency medical condition.  Patient will be admitted to [facility] continuous observation for crisis management, stabilization and safety while inpatient bed is investigated.  Jasper Riling, NP 10/18/22  6:52 AM
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
## 2022-10-18 ENCOUNTER — Other Ambulatory Visit: Payer: Self-pay

## 2022-10-18 DIAGNOSIS — F250 Schizoaffective disorder, bipolar type: Secondary | ICD-10-CM | POA: Diagnosis not present

## 2022-10-18 LAB — COMPREHENSIVE METABOLIC PANEL
ALT: 21 U/L (ref 0–44)
AST: 31 U/L (ref 15–41)
Albumin: 3.8 g/dL (ref 3.5–5.0)
Alkaline Phosphatase: 44 U/L (ref 38–126)
Anion gap: 11 (ref 5–15)
BUN: 19 mg/dL (ref 6–20)
CO2: 25 mmol/L (ref 22–32)
Calcium: 9.3 mg/dL (ref 8.9–10.3)
Chloride: 102 mmol/L (ref 98–111)
Creatinine, Ser: 0.8 mg/dL (ref 0.44–1.00)
GFR, Estimated: >60 mL/min (ref >60)
Glucose, Bld: 145 mg/dL — ABNORMAL HIGH (ref 70–99)
Potassium: 4 mmol/L (ref 3.5–5.1)
Sodium: 138 mmol/L (ref 135–145)
Total Bilirubin: 0.5 mg/dL (ref 0.3–1.2)
Total Protein: 8 g/dL (ref 6.5–8.1)

## 2022-10-18 LAB — CBC WITH DIFFERENTIAL/PLATELET
Abs Immature Granulocytes: 0.02 10*3/uL (ref 0.00–0.07)
Basophils Absolute: 0 10*3/uL (ref 0.0–0.1)
Basophils Relative: 0 %
Eosinophils Absolute: 0 10*3/uL (ref 0.0–0.5)
Eosinophils Relative: 0 %
HCT: 31.9 % — ABNORMAL LOW (ref 36.0–46.0)
Hemoglobin: 10.8 g/dL — ABNORMAL LOW (ref 12.0–15.0)
Immature Granulocytes: 0 %
Lymphocytes Relative: 23 %
Lymphs Abs: 1.9 10*3/uL (ref 0.7–4.0)
MCH: 30.3 pg (ref 26.0–34.0)
MCHC: 33.9 g/dL (ref 30.0–36.0)
MCV: 89.4 fL (ref 80.0–100.0)
Monocytes Absolute: 0.9 10*3/uL (ref 0.1–1.0)
Monocytes Relative: 11 %
Neutro Abs: 5.5 10*3/uL (ref 1.7–7.7)
Neutrophils Relative %: 66 %
Platelets: 185 10*3/uL (ref 150–400)
RBC: 3.57 MIL/uL — ABNORMAL LOW (ref 3.87–5.11)
RDW: 14 % (ref 11.5–15.5)
WBC: 8.4 10*3/uL (ref 4.0–10.5)
nRBC: 0 % (ref 0.0–0.2)

## 2022-10-18 LAB — POCT URINE DRUG SCREEN - MANUAL ENTRY (I-SCREEN)
POC Amphetamine UR: NOT DETECTED
POC Buprenorphine (BUP): NOT DETECTED
POC Cocaine UR: NOT DETECTED
POC Marijuana UR: NOT DETECTED
POC Methadone UR: NOT DETECTED
POC Methamphetamine UR: NOT DETECTED
POC Morphine: NOT DETECTED
POC Oxazepam (BZO): NOT DETECTED
POC Oxycodone UR: NOT DETECTED
POC Secobarbital (BAR): NOT DETECTED

## 2022-10-18 LAB — TSH: TSH: 2.837 u[IU]/mL (ref 0.350–4.500)

## 2022-10-18 LAB — VALPROIC ACID LEVEL: Valproic Acid Lvl: 45 ug/mL — ABNORMAL LOW (ref 50.0–100.0)

## 2022-10-18 MED ORDER — LORAZEPAM 1 MG PO TABS: 1.0000 mg | ORAL_TABLET | Freq: Once | ORAL | Status: DC | PRN

## 2022-10-18 NOTE — Progress Notes (Signed)
Pt was accepted to [facility] TODAY 10/18/2022-Behavioral Health Unit [facility], [place]   Pt meets inpatient criteria per Tilford Pillar  Attending Physician will be Dr. Ananias Pilgrim. Michelle Piper, MD  Report can be called to: - [phone]  Pt can arrive after: BED IS READY NOW  Care Team notified: Paris Lore [facility], LCSWA 10/18/2022 @ 5:10 PM

## 2022-10-18 NOTE — Progress Notes (Addendum)
Pt is PENDING acceptance to [facility], [place] [address] PENDING Labs and Negative COVID.  Care Team notified: Charlynn Grimes [facility], MSW, [facility] 10/18/2022 2:38 PM

## 2022-10-18 NOTE — Discharge Instructions (Addendum)
Pt was accepted to [facility] TODAY 10/18/2022-Behavioral Health Unit [facility], [place]    Pt meets inpatient criteria per Tilford Pillar   Attending Physician will be Dr. Ananias Pilgrim. Michelle Piper, MD   Report can be called to: - [phone]   Pt can arrive after: BED IS READY NOW

## 2022-10-18 NOTE — ED Provider Notes (Cosign Needed Addendum)
Behavioral Health Progress Note  Date and Time: 10/18/2022 9:34 AM Name: [name] [name] MRN:  [id number]  Subjective:  [name] " I am chosen by God she is  [name] [name] well-known to this service.  She presents disorganized, hyper religious and tangential.  Patient is difficult to follow throughout this assessment.  As she continues to have disjointed rambling.  She does report that she has been taking her medication however is unable to identify which medication she is taking daily.  States " Seroquel 400 mg so I do not overdose. "  She is requesting a discharge.  States she resides with some lady who is afraid of her.  Patient observed reading Bible" and scriptures.  Patient was recommended for inpatient admission.  Chart reviewed no documented overt behaviors while on the unit.  CSW to continue seeking admission at this time.  Support, encouragement and reassurance was provided.  Per admission assessment note: "[name] [name] is a  42 y/o female with a psychiatric history of schizoaffective disorder, bipolar type presenting tonight via [facility] for erratic behaviors at home and psychosis.  Patient presents with pressured speech, hyper religiosity and appears delusional and manic."   Diagnosis:  Final diagnoses:  Schizoaffective disorder, bipolar type (HCC)    Total Time spent with patient: 15 minutes  Past Psychiatric History:  Past Medical History:  Family History:  Family Psychiatric  History:  Social History:   Additional Social History:    Pain Medications: See MAR Prescriptions: See MAR Over the Counter: See MAR History of alcohol / drug use?: No history of alcohol / drug abuse Longest period of sobriety (when/how long): None. Negative Consequences of Use:  (None.) Withdrawal Symptoms: None                    Sleep: Good  Appetite:  Good  Current Medications:  Current Facility-Administered Medications  Medication Dose Route Frequency  Provider Last Rate Last Admin   acetaminophen (TYLENOL) tablet 650 mg  650 mg Oral Q6H PRN Bobbitt, Shalon E, NP       alum & mag hydroxide-simeth (MAALOX/MYLANTA) 200-200-20 MG/5ML suspension 30 mL  30 mL Oral Q4H PRN Bobbitt, Shalon E, NP       divalproex (DEPAKOTE ER) 24 hr tablet 1,000 mg  1,000 mg Oral Daily Bobbitt, Shalon E, NP       losartan (COZAAR) tablet 50 mg  50 mg Oral Daily Bobbitt, Shalon E, NP       magnesium hydroxide (MILK OF MAGNESIA) suspension 30 mL  30 mL Oral Daily PRN Bobbitt, Shalon E, NP       metFORMIN (GLUCOPHAGE-XR) 24 hr tablet 500 mg  500 mg Oral BID WC Bobbitt, Shalon E, NP   500 mg at 10/18/22 0748   metoprolol succinate (TOPROL-XL) 24 hr tablet 25 mg  25 mg Oral QHS Bobbitt, Shalon E, NP   25 mg at 10/17/22 2337   QUEtiapine (SEROQUEL XR) 24 hr tablet 600 mg  600 mg Oral QHS Bobbitt, Shalon E, NP   600 mg at 10/17/22 2337   temazepam (RESTORIL) capsule 15 mg  15 mg Oral QHS PRN Bobbitt, Shalon E, NP   15 mg at 10/17/22 2337   Current Outpatient Medications  Medication Sig Dispense Refill   divalproex (DEPAKOTE ER) 500 MG 24 hr tablet Take 2 tablets (1,000 mg total) by mouth daily. 60 tablet 0   hydrOXYzine (ATARAX) 25 MG tablet Take 1 tablet (25 mg total) by mouth 3 (three) times  daily as needed for anxiety. 30 tablet 0   losartan (COZAAR) 50 MG tablet Take 1 tablet (50 mg total) by mouth daily. 30 tablet 0   metFORMIN (GLUCOPHAGE-XR) 500 MG 24 hr tablet Take 1 tablet (500 mg total) by mouth 2 (two) times daily with a meal. 60 tablet 0   metoprolol succinate (TOPROL-XL) 25 MG 24 hr tablet Take 1 tablet (25 mg total) by mouth at bedtime. 30 tablet 0   [START ON 10/27/2022] paliperidone (INVEGA SUSTENNA) 234 MG/1.5ML injection Inject 234 mg into the muscle once for 1 dose. Administer on 10-27-22. Patient received last dose on 09-27-22 1.5 mL 0   QUEtiapine (SEROQUEL XR) 300 MG 24 hr tablet Take 2 tablets (600 mg total) by mouth at bedtime. 60 tablet 0   temazepam  (RESTORIL) 15 MG capsule Take 1 capsule (15 mg total) by mouth at bedtime as needed for sleep. (Patient not taking: Reported on 10/05/2022) 30 capsule 0    Labs  Lab Results:  Admission on 10/17/2022  Component Date Value Ref Range Status   WBC 10/17/2022 8.4  4.0 - 10.5 K/uL Final   RBC 10/17/2022 3.57 (L)  3.87 - 5.11 MIL/uL Final   Hemoglobin 10/17/2022 10.8 (L)  12.0 - 15.0 g/dL Final   HCT 11/91/4782 31.9 (L)  36.0 - 46.0 % Final   MCV 10/17/2022 89.4  80.0 - 100.0 fL Final   MCH 10/17/2022 30.3  26.0 - 34.0 pg Final   MCHC 10/17/2022 33.9  30.0 - 36.0 g/dL Final   RDW 95/62/1308 14.0  11.5 - 15.5 % Final   Platelets 10/17/2022 185  150 - 400 K/uL Final   nRBC 10/17/2022 0.0  0.0 - 0.2 % Final   Neutrophils Relative % 10/17/2022 66  % Final   Neutro Abs 10/17/2022 5.5  1.7 - 7.7 K/uL Final   Lymphocytes Relative 10/17/2022 23  % Final   Lymphs Abs 10/17/2022 1.9  0.7 - 4.0 K/uL Final   Monocytes Relative 10/17/2022 11  % Final   Monocytes Absolute 10/17/2022 0.9  0.1 - 1.0 K/uL Final   Eosinophils Relative 10/17/2022 0  % Final   Eosinophils Absolute 10/17/2022 0.0  0.0 - 0.5 K/uL Final   Basophils Relative 10/17/2022 0  % Final   Basophils Absolute 10/17/2022 0.0  0.0 - 0.1 K/uL Final   Immature Granulocytes 10/17/2022 0  % Final   Abs Immature Granulocytes 10/17/2022 0.02  0.00 - 0.07 K/uL Final   Performed at [facility], [address] [place], [place] [address]   Sodium 10/17/2022 138  135 - 145 mmol/L Final   Potassium 10/17/2022 4.0  3.5 - 5.1 mmol/L Final   Chloride 10/17/2022 102  98 - 111 mmol/L Final   CO2 10/17/2022 25  22 - 32 mmol/L Final   Glucose, Bld 10/17/2022 145 (H)  70 - 99 mg/dL Final   Glucose reference range applies only to samples taken after fasting for at least 8 hours.   BUN 10/17/2022 19  6 - 20 mg/dL Final   Creatinine, Ser 10/17/2022 0.80  0.44 - 1.00 mg/dL Final   Calcium 69/62/9528 9.3  8.9 - 10.3 mg/dL Final   Total Protein  10/17/2022 8.0  6.5 - 8.1 g/dL Final   Albumin 41/32/4401 3.8  3.5 - 5.0 g/dL Final   AST 02/72/5366 31  15 - 41 U/L Final   ALT 10/17/2022 21  0 - 44 U/L Final   Alkaline Phosphatase 10/17/2022 44  38 -  126 U/L Final   Total Bilirubin 10/17/2022 0.5  0.3 - 1.2 mg/dL Final   GFR, Estimated 10/17/2022 >60  >60 mL/min Final   Comment: (NOTE) Calculated using the CKD-EPI Creatinine Equation (2021)    Anion gap 10/17/2022 11  5 - 15 Final   Performed at [facility], [address], [place], [place] [address]   Valproic Acid Lvl 10/17/2022 45 (L)  50.0 - 100.0 ug/mL Final   Performed at [facility], [address], [place], [place] [address]   TSH 10/17/2022 2.837  0.350 - 4.500 uIU/mL Final   Comment: Performed by a 3rd Generation assay with a functional sensitivity of <=0.01 uIU/mL. Performed at [facility], [address], [place], [place] [address]   Admission on 10/05/2022, Discharged on 10/05/2022  Component Date Value Ref Range Status   WBC 10/05/2022 5.7  4.0 - 10.5 K/uL Final   RBC 10/05/2022 3.53 (L)  3.87 - 5.11 MIL/uL Final   Hemoglobin 10/05/2022 10.8 (L)  12.0 - 15.0 g/dL Final   HCT 57/84/6962 32.3 (L)  36.0 - 46.0 % Final   MCV 10/05/2022 91.5  80.0 - 100.0 fL Final   MCH 10/05/2022 30.6  26.0 - 34.0 pg Final   MCHC 10/05/2022 33.4  30.0 - 36.0 g/dL Final   RDW 95/28/4132 13.9  11.5 - 15.5 % Final   Platelets 10/05/2022 233  150 - 400 K/uL Final   nRBC 10/05/2022 0.0  0.0 - 0.2 % Final   Neutrophils Relative % 10/05/2022 56  % Final   Neutro Abs 10/05/2022 3.2  1.7 - 7.7 K/uL Final   Lymphocytes Relative 10/05/2022 33  % Final   Lymphs Abs 10/05/2022 1.9  0.7 - 4.0 K/uL Final   Monocytes Relative 10/05/2022 10  % Final   Monocytes Absolute 10/05/2022 0.6  0.1 - 1.0 K/uL Final   Eosinophils Relative 10/05/2022 1  % Final   Eosinophils Absolute 10/05/2022 0.0  0.0 - 0.5 K/uL Final   Basophils Relative 10/05/2022 0  % Final   Basophils Absolute  10/05/2022 0.0  0.0 - 0.1 K/uL Final   Immature Granulocytes 10/05/2022 0  % Final   Abs Immature Granulocytes 10/05/2022 0.01  0.00 - 0.07 K/uL Final   Performed at [facility], [address], [place], [place] [address]   Sodium 10/05/2022 139  135 - 145 mmol/L Final   Potassium 10/05/2022 4.1  3.5 - 5.1 mmol/L Final   Chloride 10/05/2022 105  98 - 111 mmol/L Final   CO2 10/05/2022 25  22 - 32 mmol/L Final   Glucose, Bld 10/05/2022 113 (H)  70 - 99 mg/dL Final   Glucose reference range applies only to samples taken after fasting for at least 8 hours.   BUN 10/05/2022 8  6 - 20 mg/dL Final   Creatinine, Ser 10/05/2022 0.79  0.44 - 1.00 mg/dL Final   Calcium 27/25/3664 8.9  8.9 - 10.3 mg/dL Final   Total Protein 40/34/7425 7.0  6.5 - 8.1 g/dL Final   Albumin 95/63/8756 3.4 (L)  3.5 - 5.0 g/dL Final   AST 43/32/9518 20  15 - 41 U/L Final   ALT 10/05/2022 19  0 - 44 U/L Final   Alkaline Phosphatase 10/05/2022 42  38 - 126 U/L Final   Total Bilirubin 10/05/2022 0.3  0.3 - 1.2 mg/dL Final   GFR, Estimated 10/05/2022 >60  >60 mL/min Final   Comment: (NOTE) Calculated using the CKD-EPI  Creatinine Equation (2021)    Anion gap 10/05/2022 9  5 - 15 Final   Performed at [facility], [address] [place], [place] [address]   Magnesium 10/05/2022 1.9  1.7 - 2.4 mg/dL Final   Performed at [facility], [address] [place], [place] [address]   Alcohol, Ethyl (B) 10/05/2022 <10  <10 mg/dL Final   Comment: (NOTE) Lowest detectable limit for serum alcohol is 10 mg/dL.  For medical purposes only. Performed at [facility], [address], [place], [place] [address]    TSH 10/05/2022 0.963  0.350 - 4.500 uIU/mL Final   Comment: Performed by a 3rd Generation assay with a functional sensitivity of <=0.01 uIU/mL. Performed at [facility], [address] [place], [place] [address]    Prolactin 10/05/2022 76.2 (H)  4.8 - 33.4 ng/mL Final   Comment:  (NOTE) Performed At: [facility] [address] [place], [place] [id number] Jolene Schimke MD MV:[id number]    Preg Test, Ur 10/05/2022 Negative  Negative Final   POC Amphetamine UR 10/05/2022 None Detected  NONE DETECTED (Cut Off Level 1000 ng/mL) Final   POC Secobarbital (BAR) 10/05/2022 None Detected  NONE DETECTED (Cut Off Level 300 ng/mL) Final   POC Buprenorphine (BUP) 10/05/2022 None Detected  NONE DETECTED (Cut Off Level 10 ng/mL) Final   POC Oxazepam (BZO) 10/05/2022 Positive (A)  NONE DETECTED (Cut Off Level 300 ng/mL) Final   POC Cocaine UR 10/05/2022 None Detected  NONE DETECTED (Cut Off Level 300 ng/mL) Final   POC Methamphetamine UR 10/05/2022 None Detected  NONE DETECTED (Cut Off Level 1000 ng/mL) Final   POC Morphine 10/05/2022 None Detected  NONE DETECTED (Cut Off Level 300 ng/mL) Final   POC Methadone UR 10/05/2022 None Detected  NONE DETECTED (Cut Off Level 300 ng/mL) Final   POC Oxycodone UR 10/05/2022 None Detected  NONE DETECTED (Cut Off Level 100 ng/mL) Final   POC Marijuana UR 10/05/2022 None Detected  NONE DETECTED (Cut Off Level 50 ng/mL) Final   Valproic Acid Lvl 10/05/2022 71  50.0 - 100.0 ug/mL Final   Performed at [facility], [address], [place], [place] [address]   Preg Test, Ur 10/05/2022 NEGATIVE  NEGATIVE Final   Comment:        THE SENSITIVITY OF THIS METHODOLOGY IS >24 mIU/mL   Admission on 09/19/2022, Discharged on 09/29/2022  Component Date Value Ref Range Status   Glucose-Capillary 09/20/2022 123 (H)  70 - 99 mg/dL Final   Glucose reference range applies only to samples taken after fasting for at least 8 hours.   Glucose-Capillary 09/21/2022 124 (H)  70 - 99 mg/dL Final   Glucose reference range applies only to samples taken after fasting for at least 8 hours.   Glucose-Capillary 09/21/2022 152 (H)  70 - 99 mg/dL Final   Glucose reference range applies only to samples taken after fasting for at least 8 hours.    Glucose-Capillary 09/23/2022 131 (H)  70 - 99 mg/dL Final   Glucose reference range applies only to samples taken after fasting for at least 8 hours.   Glucose-Capillary 09/23/2022 160 (H)  70 - 99 mg/dL Final   Glucose reference range applies only to samples taken after fasting for at least 8 hours.   Cholesterol 09/23/2022 163  0 - 200 mg/dL Final   Triglycerides 44/01/270 97  <150 mg/dL Final   HDL 53/66/4403 52  >40 mg/dL Final  Total CHOL/HDL Ratio 09/23/2022 3.1  RATIO Final   VLDL 09/23/2022 19  0 - 40 mg/dL Final   LDL Cholesterol 09/23/2022 92  0 - 99 mg/dL Final   Comment:        Total Cholesterol/HDL:CHD Risk Coronary Heart Disease Risk Table                     Men   Women  1/2 Average Risk   3.4   3.3  Average Risk       5.0   4.4  2 X Average Risk   9.6   7.1  3 X Average Risk  23.4   11.0        Use the calculated Patient Ratio above and the CHD Risk Table to determine the patient's CHD Risk.        ATP III CLASSIFICATION (LDL):  <100     mg/dL   Optimal  696-295  mg/dL   Near or Above                    Optimal  130-159  mg/dL   Borderline  284-132  mg/dL   High  >440     mg/dL   Very High Performed at [facility], [address], [place], [place] [address]    TSH 09/23/2022 0.224 (L)  0.350 - 4.500 uIU/mL Final   Comment: Performed by a 3rd Generation assay with a functional sensitivity of <=0.01 uIU/mL. Performed at [facility], [address], [place], [place] [address]    Glucose-Capillary 09/23/2022 141 (H)  70 - 99 mg/dL Final   Glucose reference range applies only to samples taken after fasting for at least 8 hours.   Glucose-Capillary 09/23/2022 135 (H)  70 - 99 mg/dL Final   Glucose reference range applies only to samples taken after fasting for at least 8 hours.   Glucose-Capillary 09/24/2022 126 (H)  70 - 99 mg/dL Final   Glucose reference range applies only to samples taken after fasting for at least 8  hours.   Glucose-Capillary 09/24/2022 107 (H)  70 - 99 mg/dL Final   Glucose reference range applies only to samples taken after fasting for at least 8 hours.   Glucose-Capillary 09/25/2022 158 (H)  70 - 99 mg/dL Final   Glucose reference range applies only to samples taken after fasting for at least 8 hours.   WBC 09/26/2022 5.6  4.0 - 10.5 K/uL Final   RBC 09/26/2022 3.69 (L)  3.87 - 5.11 MIL/uL Final   Hemoglobin 09/26/2022 11.5 (L)  12.0 - 15.0 g/dL Final   HCT 40/34/7425 34.8 (L)  36.0 - 46.0 % Final   MCV 09/26/2022 94.3  80.0 - 100.0 fL Final   MCH 09/26/2022 31.2  26.0 - 34.0 pg Final   MCHC 09/26/2022 33.0  30.0 - 36.0 g/dL Final   RDW 95/63/8756 13.9  11.5 - 15.5 % Final   Platelets 09/26/2022 187  150 - 400 K/uL Final   nRBC 09/26/2022 0.0  0.0 - 0.2 % Final   Neutrophils Relative % 09/26/2022 49  % Final   Neutro Abs 09/26/2022 2.8  1.7 - 7.7 K/uL Final   Lymphocytes Relative 09/26/2022 39  % Final   Lymphs Abs 09/26/2022 2.2  0.7 - 4.0 K/uL Final   Monocytes Relative 09/26/2022 10  % Final   Monocytes Absolute 09/26/2022 0.5  0.1 - 1.0 K/uL Final   Eosinophils Relative 09/26/2022  2  % Final   Eosinophils Absolute 09/26/2022 0.1  0.0 - 0.5 K/uL Final   Basophils Relative 09/26/2022 0  % Final   Basophils Absolute 09/26/2022 0.0  0.0 - 0.1 K/uL Final   Immature Granulocytes 09/26/2022 0  % Final   Abs Immature Granulocytes 09/26/2022 0.01  0.00 - 0.07 K/uL Final   Performed at [facility], [address], [place], [place] [address]   Glucose-Capillary 09/25/2022 123 (H)  70 - 99 mg/dL Final   Glucose reference range applies only to samples taken after fasting for at least 8 hours.   Glucose-Capillary 09/25/2022 82  70 - 99 mg/dL Final   Glucose reference range applies only to samples taken after fasting for at least 8 hours.   Glucose-Capillary 09/25/2022 136 (H)  70 - 99 mg/dL Final   Glucose reference range applies only to samples taken after fasting  for at least 8 hours.   Glucose-Capillary 09/26/2022 128 (H)  70 - 99 mg/dL Final   Glucose reference range applies only to samples taken after fasting for at least 8 hours.   Glucose-Capillary 09/26/2022 143 (H)  70 - 99 mg/dL Final   Glucose reference range applies only to samples taken after fasting for at least 8 hours.   Valproic Acid Lvl 09/27/2022 92  50.0 - 100.0 ug/mL Final   Performed at [facility], [address] [place], [place] [address]   Free T4 09/27/2022 0.71  0.61 - 1.12 ng/dL Final   Comment: (NOTE) Biotin ingestion may interfere with free T4 tests. If the results are inconsistent with the TSH level, previous test results, or the clinical presentation, then consider biotin interference. If needed, order repeat testing after stopping biotin. Performed at [facility], [address], [place], [place] [address]    T3, Total 09/27/2022 86  71 - 180 ng/dL Final   Comment: (NOTE) Performed At: [facility] [address] [place], [place] [id number] Jolene Schimke MD UV:[id number]    TSH 09/27/2022 1.818  0.350 - 4.500 uIU/mL Final   Comment: Performed by a 3rd Generation assay with a functional sensitivity of <=0.01 uIU/mL. Performed at [facility], [address], [place], [place] [address]    Glucose-Capillary 09/26/2022 141 (H)  70 - 99 mg/dL Final   Glucose reference range applies only to samples taken after fasting for at least 8 hours.   Comment 1 09/26/2022 Notify RN   Final   Glucose-Capillary 09/27/2022 142 (H)  70 - 99 mg/dL Final   Glucose reference range applies only to samples taken after fasting for at least 8 hours.   Comment 1 09/27/2022 Notify RN   Final   Glucose-Capillary 09/27/2022 167 (H)  70 - 99 mg/dL Final   Glucose reference range applies only to samples taken after fasting for at least 8 hours.   Comment 1 09/27/2022 Notify RN   Final   Comment 2 09/27/2022 Document in Chart   Final    Glucose-Capillary 09/27/2022 112 (H)  70 - 99 mg/dL Final   Glucose reference range applies only to samples taken after fasting for at least 8 hours.   Glucose-Capillary 09/28/2022 133 (H)  70 - 99 mg/dL Final   Glucose reference range applies only to samples taken after fasting for at least 8 hours.   Glucose-Capillary 09/28/2022 112 (H)  70 - 99 mg/dL Final   Glucose reference range applies only to samples taken after fasting for at least  8 hours.   Glucose-Capillary 09/28/2022 133 (H)  70 - 99 mg/dL Final   Glucose reference range applies only to samples taken after fasting for at least 8 hours.   Glucose-Capillary 09/28/2022 87  70 - 99 mg/dL Final   Glucose reference range applies only to samples taken after fasting for at least 8 hours.   Glucose-Capillary 09/29/2022 118 (H)  70 - 99 mg/dL Final   Glucose reference range applies only to samples taken after fasting for at least 8 hours.  Admission on 09/18/2022, Discharged on 09/19/2022  Component Date Value Ref Range Status   Sodium 09/18/2022 139  135 - 145 mmol/L Final   Potassium 09/18/2022 3.5  3.5 - 5.1 mmol/L Final   Chloride 09/18/2022 104  98 - 111 mmol/L Final   CO2 09/18/2022 23  22 - 32 mmol/L Final   Glucose, Bld 09/18/2022 147 (H)  70 - 99 mg/dL Final   Glucose reference range applies only to samples taken after fasting for at least 8 hours.   BUN 09/18/2022 10  6 - 20 mg/dL Final   Creatinine, Ser 09/18/2022 0.80  0.44 - 1.00 mg/dL Final   Calcium 91/47/8295 9.6  8.9 - 10.3 mg/dL Final   Total Protein 62/13/0865 8.7 (H)  6.5 - 8.1 g/dL Final   Albumin 78/46/9629 4.3  3.5 - 5.0 g/dL Final   AST 52/84/1324 17  15 - 41 U/L Final   ALT 09/18/2022 13  0 - 44 U/L Final   Alkaline Phosphatase 09/18/2022 43  38 - 126 U/L Final   Total Bilirubin 09/18/2022 0.7  0.3 - 1.2 mg/dL Final   GFR, Estimated 09/18/2022 >60  >60 mL/min Final   Comment: (NOTE) Calculated using the CKD-EPI Creatinine Equation (2021)    Anion gap  09/18/2022 12  5 - 15 Final   Performed at [facility], [address], [place], [place] [address]   Alcohol, Ethyl (B) 09/18/2022 <10  <10 mg/dL Final   Comment: (NOTE) Lowest detectable limit for serum alcohol is 10 mg/dL.  For medical purposes only. Performed at [facility], [address] [place], [place] [address]    Salicylate Lvl 09/18/2022 <7.0 (L)  7.0 - 30.0 mg/dL Final   Performed at [facility], [address] [place], [place] [address]   Acetaminophen (Tylenol), Serum 09/18/2022 <10 (L)  10 - 30 ug/mL Final   Comment: (NOTE) Therapeutic concentrations vary significantly. A range of 10-30 ug/mL  may be an effective concentration for many patients. However, some  are best treated at concentrations outside of this range. Acetaminophen concentrations >150 ug/mL at 4 hours after ingestion  and >50 ug/mL at 12 hours after ingestion are often associated with  toxic reactions.  Performed at [facility], [address] [place], [place] [address]    WBC 09/18/2022 7.0  4.0 - 10.5 K/uL Final   RBC 09/18/2022 3.93  3.87 - 5.11 MIL/uL Final   Hemoglobin 09/18/2022 12.1  12.0 - 15.0 g/dL Final   HCT 95/63/8756 35.9 (L)  36.0 - 46.0 % Final   MCV 09/18/2022 91.3  80.0 - 100.0 fL Final   MCH 09/18/2022 30.8  26.0 - 34.0 pg Final   MCHC 09/18/2022 33.7  30.0 - 36.0 g/dL Final   RDW 43/32/9518 13.5  11.5 - 15.5 % Final   Platelets 09/18/2022 181  150 - 400 K/uL Final   nRBC 09/18/2022 0.0  0.0 - 0.2 % Final  Performed at [facility], [address] [place], [place] [address]   Opiates 09/18/2022 NONE DETECTED  NONE DETECTED Final   Cocaine 09/18/2022 NONE DETECTED  NONE DETECTED Final   Benzodiazepines 09/18/2022 NONE DETECTED  NONE DETECTED Final   Amphetamines 09/18/2022 NONE DETECTED  NONE DETECTED Final   Tetrahydrocannabinol 09/18/2022 NONE DETECTED  NONE DETECTED Final    Barbiturates 09/18/2022 NONE DETECTED  NONE DETECTED Final   Comment: (NOTE) DRUG SCREEN FOR MEDICAL PURPOSES ONLY.  IF CONFIRMATION IS NEEDED FOR ANY PURPOSE, NOTIFY LAB WITHIN 5 DAYS.  LOWEST DETECTABLE LIMITS FOR URINE DRUG SCREEN Drug Class                     Cutoff (ng/mL) Amphetamine and metabolites    1000 Barbiturate and metabolites    200 Benzodiazepine                 200 Opiates and metabolites        300 Cocaine and metabolites        300 THC                            50 Performed at [facility], [address] [place], [place] [address]    Preg, Serum 09/18/2022 NEGATIVE  NEGATIVE Final   Comment:        THE SENSITIVITY OF THIS METHODOLOGY IS >10 mIU/mL. Performed at [facility], [address] [place], [place] [address]    Yeast Wet Prep HPF POC 09/18/2022 NONE SEEN  NONE SEEN Final   Trich, Wet Prep 09/18/2022 NONE SEEN  NONE SEEN Final   Clue Cells Wet Prep HPF POC 09/18/2022 NONE SEEN  NONE SEEN Final   WBC, Wet Prep HPF POC 09/18/2022 <10  <10 Final   Sperm 09/18/2022 NONE SEEN   Final   Performed at [facility], [address], [place], [place] [address]   Neisseria Gonorrhea 09/18/2022 Negative   Final   Chlamydia 09/18/2022 Negative   Final   Comment 09/18/2022 Normal Reference Ranger Chlamydia - Negative   Final   Comment 09/18/2022 Normal Reference Range Neisseria Gonorrhea - Negative   Final   HIV Screen 4th Generation wRfx 09/19/2022 Non Reactive  Non Reactive Final   Performed at [facility], [address] [place], [place] [address]   Hgb A1c MFr Bld 09/18/2022 6.9 (H)  4.8 - 5.6 % Final   Comment: (NOTE)         Prediabetes: 5.7 - 6.4         Diabetes: >6.4         Glycemic control for adults with diabetes: <7.0    Mean Plasma Glucose 09/18/2022 151  mg/dL Final   Comment: (NOTE) Performed At: [facility] [address] [place], [place] [id number] Jolene Schimke  MD PI:[id number]    Preg Test, Ur 09/19/2022 NEGATIVE  NEGATIVE Final   Comment:        THE SENSITIVITY OF THIS METHODOLOGY IS >25 mIU/mL. Performed at [facility], [address], [place], [place] [address]    Color, Urine 09/19/2022 YELLOW  YELLOW Final   APPearance 09/19/2022 HAZY (A)  CLEAR Final   Specific Gravity, Urine 09/19/2022 1.010  1.005 - 1.030 Final   pH 09/19/2022 5.0  5.0 - 8.0 Final   Glucose, UA 09/19/2022 NEGATIVE  NEGATIVE mg/dL Final   Hgb  urine dipstick 09/19/2022 NEGATIVE  NEGATIVE Final   Bilirubin Urine 09/19/2022 NEGATIVE  NEGATIVE Final   Ketones, ur 09/19/2022 5 (A)  NEGATIVE mg/dL Final   Protein, ur 82/95/6213 NEGATIVE  NEGATIVE mg/dL Final   Nitrite 08/65/7846 NEGATIVE  NEGATIVE Final   Leukocytes,Ua 09/19/2022 SMALL (A)  NEGATIVE Final   RBC / HPF 09/19/2022 0-5  0 - 5 RBC/hpf Final   WBC, UA 09/19/2022 0-5  0 - 5 WBC/hpf Final   Bacteria, UA 09/19/2022 NONE SEEN  NONE SEEN Final   Squamous Epithelial / HPF 09/19/2022 0-5  0 - 5 /HPF Final   Mucus 09/19/2022 PRESENT   Final   Hyaline Casts, UA 09/19/2022 PRESENT   Final   Performed at [facility], [address], [place], [place] [address]   Glucose-Capillary 09/19/2022 107 (H)  70 - 99 mg/dL Final   Glucose reference range applies only to samples taken after fasting for at least 8 hours.   Glucose-Capillary 09/19/2022 123 (H)  70 - 99 mg/dL Final   Glucose reference range applies only to samples taken after fasting for at least 8 hours.   Glucose-Capillary 09/19/2022 98  70 - 99 mg/dL Final   Glucose reference range applies only to samples taken after fasting for at least 8 hours.   Glucose-Capillary 09/19/2022 134 (H)  70 - 99 mg/dL Final   Glucose reference range applies only to samples taken after fasting for at least 8 hours.  Admission on 08/04/2022, Discharged on 08/06/2022  Component Date Value Ref Range Status   Sodium 08/04/2022 139  135 - 145 mmol/L  Final   Potassium 08/04/2022 3.5  3.5 - 5.1 mmol/L Final   Chloride 08/04/2022 101  98 - 111 mmol/L Final   CO2 08/04/2022 25  22 - 32 mmol/L Final   Glucose, Bld 08/04/2022 91  70 - 99 mg/dL Final   Glucose reference range applies only to samples taken after fasting for at least 8 hours.   BUN 08/04/2022 11  6 - 20 mg/dL Final   Creatinine, Ser 08/04/2022 0.95  0.44 - 1.00 mg/dL Final   Calcium 28/41/3244 9.1  8.9 - 10.3 mg/dL Final   Total Protein 01/08/7251 7.4  6.5 - 8.1 g/dL Final   Albumin 66/44/0347 3.7  3.5 - 5.0 g/dL Final   AST 42/59/5638 29  15 - 41 U/L Final   ALT 08/04/2022 17  0 - 44 U/L Final   Alkaline Phosphatase 08/04/2022 43  38 - 126 U/L Final   Total Bilirubin 08/04/2022 1.1  0.3 - 1.2 mg/dL Final   GFR, Estimated 08/04/2022 >60  >60 mL/min Final   Comment: (NOTE) Calculated using the CKD-EPI Creatinine Equation (2021)    Anion gap 08/04/2022 13  5 - 15 Final   Performed at [facility], [address], [place], [place] [address]   Alcohol, Ethyl (B) 08/04/2022 <10  <10 mg/dL Final   Comment: (NOTE) Lowest detectable limit for serum alcohol is 10 mg/dL.  For medical purposes only. Performed at [facility], [address] [place], [place] [address]    Opiates 08/06/2022 NONE DETECTED  NONE DETECTED Final   Cocaine 08/06/2022 NONE DETECTED  NONE DETECTED Final   Benzodiazepines 08/06/2022 POSITIVE (A)  NONE DETECTED Final   Amphetamines 08/06/2022 NONE DETECTED  NONE DETECTED Final   Tetrahydrocannabinol 08/06/2022 NONE DETECTED  NONE DETECTED Final   Barbiturates 08/06/2022 NONE DETECTED  NONE DETECTED Final   Comment: (NOTE) DRUG SCREEN  FOR MEDICAL PURPOSES ONLY.  IF CONFIRMATION IS NEEDED FOR ANY PURPOSE, NOTIFY LAB WITHIN 5 DAYS.  LOWEST DETECTABLE LIMITS FOR URINE DRUG SCREEN Drug Class                     Cutoff (ng/mL) Amphetamine and metabolites    1000 Barbiturate and metabolites    200 Benzodiazepine                 200 Opiates  and metabolites        300 Cocaine and metabolites        300 THC                            50 Performed at [facility], [address], [place], [place] [address]    WBC 08/04/2022 6.3  4.0 - 10.5 K/uL Final   RBC 08/04/2022 3.47 (L)  3.87 - 5.11 MIL/uL Final   Hemoglobin 08/04/2022 10.6 (L)  12.0 - 15.0 g/dL Final   HCT 19/14/7829 32.5 (L)  36.0 - 46.0 % Final   MCV 08/04/2022 93.7  80.0 - 100.0 fL Final   MCH 08/04/2022 30.5  26.0 - 34.0 pg Final   MCHC 08/04/2022 32.6  30.0 - 36.0 g/dL Final   RDW 56/21/3086 13.6  11.5 - 15.5 % Final   Platelets 08/04/2022 191  150 - 400 K/uL Final   nRBC 08/04/2022 0.0  0.0 - 0.2 % Final   Neutrophils Relative % 08/04/2022 53  % Final   Neutro Abs 08/04/2022 3.3  1.7 - 7.7 K/uL Final   Lymphocytes Relative 08/04/2022 30  % Final   Lymphs Abs 08/04/2022 1.9  0.7 - 4.0 K/uL Final   Monocytes Relative 08/04/2022 16  % Final   Monocytes Absolute 08/04/2022 1.0  0.1 - 1.0 K/uL Final   Eosinophils Relative 08/04/2022 1  % Final   Eosinophils Absolute 08/04/2022 0.0  0.0 - 0.5 K/uL Final   Basophils Relative 08/04/2022 0  % Final   Basophils Absolute 08/04/2022 0.0  0.0 - 0.1 K/uL Final   Immature Granulocytes 08/04/2022 0  % Final   Abs Immature Granulocytes 08/04/2022 0.01  0.00 - 0.07 K/uL Final   Performed at [facility], [address], [place], [place] [address]   Preg, Serum 08/04/2022 NEGATIVE  NEGATIVE Final   Comment:        THE SENSITIVITY OF THIS METHODOLOGY IS >10 mIU/mL. Performed at [facility], [address], [place], [place] [address]    Salicylate Lvl 08/04/2022 <7.0 (L)  7.0 - 30.0 mg/dL Final   Performed at [facility], [address], [place], [place] [address]   Acetaminophen (Tylenol), Serum 08/04/2022 <10 (L)  10 - 30 ug/mL Final   Comment: (NOTE) Therapeutic concentrations vary significantly. A range of 10-30 ug/mL  may be an effective concentration for many patients. However, some  are  best treated at concentrations outside of this range. Acetaminophen concentrations >150 ug/mL at 4 hours after ingestion  and >50 ug/mL at 12 hours after ingestion are often associated with  toxic reactions.  Performed at [facility], [address], [place], [place] [address]    Valproic Acid Lvl 08/04/2022 34 (L)  50.0 - 100.0 ug/mL Final   Performed at [facility], [address], [place], [place] [address]   TSH 08/04/2022 0.489  0.350 - 4.500 uIU/mL  Final   Comment: Performed by a 3rd Generation assay with a functional sensitivity of <=0.01 uIU/mL. Performed at [facility], [address] [place], [place] [address]    Free T4 08/04/2022 1.07  0.61 - 1.12 ng/dL Final   Comment: (NOTE) Biotin ingestion may interfere with free T4 tests. If the results are inconsistent with the TSH level, previous test results, or the clinical presentation, then consider biotin interference. If needed, order repeat testing after stopping biotin. Performed at [facility], [address] [place], [place] [address]   Admission on 07/24/2022, Discharged on 07/24/2022  Component Date Value Ref Range Status   Sodium 07/24/2022 133 (L)  135 - 145 mmol/L Final   Potassium 07/24/2022 3.5  3.5 - 5.1 mmol/L Final   Chloride 07/24/2022 102  98 - 111 mmol/L Final   CO2 07/24/2022 23  22 - 32 mmol/L Final   Glucose, Bld 07/24/2022 130 (H)  70 - 99 mg/dL Final   Glucose reference range applies only to samples taken after fasting for at least 8 hours.   BUN 07/24/2022 8  6 - 20 mg/dL Final   Creatinine, Ser 07/24/2022 0.90  0.44 - 1.00 mg/dL Final   Calcium 09/81/1914 9.0  8.9 - 10.3 mg/dL Final   Total Protein 78/29/5621 7.7  6.5 - 8.1 g/dL Final   Albumin 30/86/5784 3.8  3.5 - 5.0 g/dL Final   AST 69/62/9528 15  15 - 41 U/L Final   ALT 07/24/2022 12  0 - 44 U/L Final   Alkaline Phosphatase 07/24/2022 39  38 - 126 U/L Final   Total Bilirubin 07/24/2022 0.7  0.3 - 1.2 mg/dL Final    GFR, Estimated 07/24/2022 >60  >60 mL/min Final   Comment: (NOTE) Calculated using the CKD-EPI Creatinine Equation (2021)    Anion gap 07/24/2022 8  5 - 15 Final   Performed at [facility], [address], [place], [place] [address]   Alcohol, Ethyl (B) 07/24/2022 <10  <10 mg/dL Final   Comment: (NOTE) Lowest detectable limit for serum alcohol is 10 mg/dL.  For medical purposes only. Performed at [facility], [address], [place], [place] [address]    Salicylate Lvl 07/24/2022 <7.0 (L)  7.0 - 30.0 mg/dL Final   Performed at [facility], [address] [place], [place] [address]   Acetaminophen (Tylenol), Serum 07/24/2022 <10 (L)  10 - 30 ug/mL Final   Comment: (NOTE) Therapeutic concentrations vary significantly. A range of 10-30 ug/mL  may be an effective concentration for many patients. However, some  are best treated at concentrations outside of this range. Acetaminophen concentrations >150 ug/mL at 4 hours after ingestion  and >50 ug/mL at 12 hours after ingestion are often associated with  toxic reactions.  Performed at [facility], [address] [place], [place] [address]    WBC 07/24/2022 4.0  4.0 - 10.5 K/uL Final   RBC 07/24/2022 3.94  3.87 - 5.11 MIL/uL Final   Hemoglobin 07/24/2022 12.6  12.0 - 15.0 g/dL Final   HCT 47/42/5956 37.0  36.0 - 46.0 % Final   MCV 07/24/2022 93.9  80.0 - 100.0 fL Final   MCH 07/24/2022 32.0  26.0 - 34.0 pg Final   MCHC 07/24/2022 34.1  30.0 - 36.0 g/dL Final   RDW 38/75/6433 13.3  11.5 - 15.5 % Final   Platelets 07/24/2022 195  150 - 400 K/uL Final   nRBC 07/24/2022  0.0  0.0 - 0.2 % Final   Performed at [facility], [address] [place], [place] [address]   Preg, Serum 07/24/2022 NEGATIVE  NEGATIVE Final   Comment:        THE SENSITIVITY OF THIS METHODOLOGY IS >10 mIU/mL. Performed at [facility], [address], [place], [place] [address]     Blood Alcohol level:  Lab Results   Component Value Date   ETH <10 10/05/2022   ETH <10 09/18/2022    Metabolic Disorder Labs: Lab Results  Component Value Date   HGBA1C 6.9 (H) 09/18/2022   MPG 151 09/18/2022   MPG 125.5 11/21/2021   Lab Results  Component Value Date   PROLACTIN 76.2 (H) 10/05/2022   PROLACTIN 15.7 10/20/2021   Lab Results  Component Value Date   CHOL 163 09/23/2022   TRIG 97 09/23/2022   HDL 52 09/23/2022   CHOLHDL 3.1 09/23/2022   VLDL 19 09/23/2022   LDLCALC 92 09/23/2022   LDLCALC 113 (H) 11/21/2021    Therapeutic Lab Levels: Lab Results  Component Value Date   LITHIUM <0.06 (L) 10/20/2021   Lab Results  Component Value Date   VALPROATE 45 (L) 10/17/2022   VALPROATE 71 10/05/2022   No results found for: "CBMZ"  Physical Findings   AIMS    Flowsheet Row [facility]) [facility] Ad[facility] (Discharged) [facility] Ad[facility] (Discharged) [facility] Ad[facility] (Discharged) [facility] Ad[facility] (Discharged) [facility]  AIMS Total Score 0 0 0 0 0      AUDIT    Flowsheet Row Admission (Discharged) from 07/05/2019 in [facility] Admission (Discharged) [facility] Ad[facility] (Discharged) [facility] Ad[facility] (Discharged) [facility] Ad[facility] (Discharged) [facility]  Alcohol Use Disorder Identification Test Final Score (AUDIT) 0 0 0 0 0      PHQ2-9    Flowsheet Row ED from 10/05/2022 in [facility]  PHQ-2 Total Score 0      Flowsheet Row ED from  10/17/2022 in [facility] ED from 10/05/2022 in [facility] ED from 10/02/2022 in [facility]  C-SSRS RISK CATEGORY No Risk No Risk No Risk        Musculoskeletal  Strength & Muscle Tone: within normal limits Gait & Station: normal Patient leans: N/A  Psychiatric Specialty Exam  Presentation  General Appearance:  Casual  Eye Contact: Good  Speech: Pressured  Speech Volume: Increased  Handedness: Right   Mood and Affect  Mood: Labile; Anxious  Affect: Congruent   Thought Process  Thought Processes: Disorganized  Descriptions of Associations:Loose  Orientation:Full (Time, Place and Person)  Thought Content:Scattered; Tangential; Delusions  Diagnosis of Schizophrenia or Schizoaffective disorder in past: Yes  Duration of Psychotic Symptoms: Greater than six months   Hallucinations:Hallucinations: Auditory Description of Auditory Hallucinations: Hears the voice the [employer] of Reference:Delusions  Suicidal Thoughts:Suicidal Thoughts: No  Homicidal Thoughts:Homicidal Thoughts: No   Sensorium  Memory: Immediate Poor; Recent Poor; Remote Poor  Judgment: Impaired  Insight: Poor   Executive Functions  Concentration: Poor  Attention  Span: Poor  Recall: Poor  Fund of Knowledge: Fair  Language: Fair   Lexicographer Activity: Psychomotor Activity: Normal   Assets  Assets: Communication Skills; Desire for Improvement; Housing; Resilience   Sleep  Sleep: Sleep: Fair Number of Hours of Sleep: 5   Nutritional Assessment (For OBS and FBC admissions only) Has the patient had a weight loss or gain of 10 pounds or more in the last 3 months?: No Has the patient had a decrease in food intake/or appetite?: No Does the patient have dental problems?: No Does the patient have eating habits or behaviors that may be indicators of an  eating disorder including binging or inducing vomiting?: No Has the patient recently lost weight without trying?: 0 Has the patient been eating poorly because of a decreased appetite?: 0 Malnutrition Screening Tool Score: 0    Physical Exam  Physical Exam Vitals and nursing note reviewed.  Constitutional:      Appearance: Normal appearance.  Cardiovascular:     Rate and Rhythm: Normal rate and regular rhythm.  Pulmonary:     Effort: Pulmonary effort is normal.     Breath sounds: Normal breath sounds.  Neurological:     Mental Status: She is alert.  Psychiatric:        Mood and Affect: Mood normal.        Thought Content: Thought content normal.    Review of Systems  Psychiatric/Behavioral:  Positive for hallucinations. The patient is nervous/anxious.   All other systems reviewed and are negative.  Blood pressure 120/73, pulse (!) 113, temperature 99.1 F (37.3 C), temperature source Oral, resp. rate 18, last menstrual period 09/11/2022, SpO2 98%, unknown if currently breastfeeding. There is no height or weight on file to calculate BMI.  Treatment Plan Summary: Daily contact with patient to assess and evaluate symptoms and progress in treatment and Medication management  -Recommended inpatient admission CSW seeking placement:  -Continue medications as directed i.e. Depakote 1000 mg,  In vega 134 IM next dose due 10/27/2022   Seroquel 600 nightly and Restoril 15 mg nightly was restarted on admission  Oneta Rack, NP 10/18/2022 9:35 AM

## 2022-10-18 NOTE — ED Notes (Signed)
[facility] called for update on pt., possible admission.  [place] to call back in 1 hour for update on lab results.

## 2022-10-18 NOTE — ED Notes (Addendum)
Pt A&O x 4, presents with Delusional behavior, rapid and pressured speech. Pt hyperverbal and religious.  Pt redirectable.  Comfort measures given.  Monitoring for safety.

## 2022-10-18 NOTE — ED Notes (Signed)
[facility] called for update on labs for pt and vital signs., states they are accepting pt.  Pending fax of their admission form.

## 2022-10-18 NOTE — ED Notes (Signed)
Pt sleeping@this time breathing even and unlabored will continue to monitor for safety 

## 2022-10-18 NOTE — Progress Notes (Signed)
This CSW has spoke with with [facility] Intake [phone] who informed that they have received the fax CSW sent and was informed that [facility] has also sent a fax. Intake shared [facility] nursing called twice. Intake informed this CSW that they will review the documents that were sent and call this CSW back for updates. CSW thank Intake at  [facility] and CSW will follow up with care team.   Maryjean Ka, MSW, [facility] 10/18/2022 3:49 PM

## 2022-11-05 DIAGNOSIS — Z5321 Procedure and treatment not carried out due to patient leaving prior to being seen by health care provider: Secondary | ICD-10-CM | POA: Insufficient documentation

## 2022-11-05 DIAGNOSIS — R4589 Other symptoms and signs involving emotional state: Secondary | ICD-10-CM | POA: Diagnosis present

## 2022-11-06 ENCOUNTER — Emergency Department (HOSPITAL_COMMUNITY)
Admission: EM | Admit: 2022-11-06 | Discharge: 2022-11-06 | Discharge status: Against Medical Advice | Payer: 59 | Attending: Emergency Medicine | Admitting: Emergency Medicine

## 2022-11-06 ENCOUNTER — Ambulatory Visit (HOSPITAL_COMMUNITY)
Admission: EM | Admit: 2022-11-06 | Discharge: 2022-11-06 | Disposition: A | Discharge status: Home / Self Care | Payer: 59

## 2022-11-06 NOTE — ED Notes (Signed)
This nurse has been advised that patient states she did not want to be seen and left the facility.

## 2022-11-06 NOTE — ED Notes (Signed)
  Patient refusing to be seen or triaged.  Patient requesting [employer] to take her to [facility] to be evaluated.

## 2022-11-08 ENCOUNTER — Ambulatory Visit (HOSPITAL_COMMUNITY)
Admission: EM | Admit: 2022-11-08 | Discharge: 2022-11-08 | Disposition: A | Discharge status: Other Acute Inpatient Hospital | Payer: 59 | Attending: Urology | Admitting: Urology

## 2022-11-08 DIAGNOSIS — Z1152 Encounter for screening for COVID-19: Secondary | ICD-10-CM | POA: Diagnosis not present

## 2022-11-08 DIAGNOSIS — E119 Type 2 diabetes mellitus without complications: Secondary | ICD-10-CM | POA: Insufficient documentation

## 2022-11-08 DIAGNOSIS — F250 Schizoaffective disorder, bipolar type: Secondary | ICD-10-CM | POA: Insufficient documentation

## 2022-11-08 LAB — CBC WITH DIFFERENTIAL/PLATELET
Abs Immature Granulocytes: 0.02 10*3/uL (ref 0.00–0.07)
Basophils Absolute: 0 10*3/uL (ref 0.0–0.1)
Basophils Relative: 0 %
Eosinophils Absolute: 0.1 10*3/uL (ref 0.0–0.5)
Eosinophils Relative: 1 %
HCT: 29.3 % — ABNORMAL LOW (ref 36.0–46.0)
Hemoglobin: 9.6 g/dL — ABNORMAL LOW (ref 12.0–15.0)
Immature Granulocytes: 0 %
Lymphocytes Relative: 32 %
Lymphs Abs: 2.1 10*3/uL (ref 0.7–4.0)
MCH: 30.4 pg (ref 26.0–34.0)
MCHC: 32.8 g/dL (ref 30.0–36.0)
MCV: 92.7 fL (ref 80.0–100.0)
Monocytes Absolute: 0.6 10*3/uL (ref 0.1–1.0)
Monocytes Relative: 10 %
Neutro Abs: 3.8 10*3/uL (ref 1.7–7.7)
Neutrophils Relative %: 57 %
Platelets: 238 10*3/uL (ref 150–400)
RBC: 3.16 MIL/uL — ABNORMAL LOW (ref 3.87–5.11)
RDW: 14.2 % (ref 11.5–15.5)
WBC: 6.6 10*3/uL (ref 4.0–10.5)
nRBC: 0 % (ref 0.0–0.2)

## 2022-11-08 LAB — COMPREHENSIVE METABOLIC PANEL
ALT: 15 U/L (ref 0–44)
AST: 18 U/L (ref 15–41)
Albumin: 3.2 g/dL — ABNORMAL LOW (ref 3.5–5.0)
Alkaline Phosphatase: 54 U/L (ref 38–126)
Anion gap: 15 (ref 5–15)
BUN: 7 mg/dL (ref 6–20)
CO2: 22 mmol/L (ref 22–32)
Calcium: 8.9 mg/dL (ref 8.9–10.3)
Chloride: 100 mmol/L (ref 98–111)
Creatinine, Ser: 0.71 mg/dL (ref 0.44–1.00)
GFR, Estimated: >60 mL/min (ref >60)
Glucose, Bld: 147 mg/dL — ABNORMAL HIGH (ref 70–99)
Potassium: 3.8 mmol/L (ref 3.5–5.1)
Sodium: 137 mmol/L (ref 135–145)
Total Bilirubin: 0.2 mg/dL — ABNORMAL LOW (ref 0.3–1.2)
Total Protein: 7.6 g/dL (ref 6.5–8.1)

## 2022-11-08 LAB — VALPROIC ACID LEVEL: Valproic Acid Lvl: 33 ug/mL — ABNORMAL LOW (ref 50.0–100.0)

## 2022-11-08 LAB — SARS CORONAVIRUS 2 BY RT PCR: SARS Coronavirus 2 by RT PCR: NEGATIVE

## 2022-11-08 MED ORDER — ACETAMINOPHEN 325 MG PO TABS: 650.0000 mg | ORAL_TABLET | Freq: Four times a day (QID) | ORAL | Status: DC | PRN

## 2022-11-08 MED ORDER — ALUM & MAG HYDROXIDE-SIMETH 200-200-20 MG/5ML PO SUSP: 30.0000 mL | ORAL | Status: DC | PRN

## 2022-11-08 MED ORDER — MAGNESIUM HYDROXIDE 400 MG/5ML PO SUSP
30.0000 mL | Freq: Every day | ORAL | Status: DC | PRN
Start: 2022-11-08 — End: 2022-11-08

## 2022-11-08 MED ORDER — HYDROXYZINE HCL 25 MG PO TABS: 25.0000 mg | ORAL_TABLET | Freq: Three times a day (TID) | ORAL | Status: DC | PRN

## 2022-11-08 NOTE — ED Provider Notes (Signed)
FBC/OBS ASAP Discharge Summary  Date and Time: 11/08/2022 12:03 PM  Name: [name] [name]  MRN:  [id number]   Discharge Diagnoses:  Final diagnoses:  Schizoaffective disorder, bipolar type [facility])    Subjective: Patient seen and evaluated face to face by this provider and chart reviewed. On evaluation, patient is alert and oriented x 3. Her thought process is disorganized with delusional and hyper religious thought content. Her speech is pressured and tangential. Patient states that she's been refusing help. She states that Hydrologist" wanted her to come here. She states that he's blocking her from enjoying her "doctorship." She states, "I think he's doing it on purpose." She randomly states that she can pray for people and that she's a doctor. She denies SI/HI/AVH. She denies physical complaints. Patient stable to transfer to [facility] for inpatient psychiatric treatment.   Stay Summary: [name] [name] is a 42 year old female patient with a past psychiatric history significant for schizoaffective disorder, bipolar type who presented to the [facility] behavioral health urgent care voluntary around 3:38 am accompanied by law enforcement for an evaluation. On exam, patient presented with disorganized thought pattern with hype-religious thought content, and tangential speech. Patient was admitted to the [facility] continuous assessment unit and recommended for inpatient psychiatric treatment. Patient was accepted and transferred this afternoon to [facility] psychiatric hospital for mood stabilization and medication management.   Total Time spent with patient:   Past Psychiatric History: schizoaffective disorder, bipolar  Past Medical History: DM without complications.   Family History: No history reported.  Family Psychiatric History: No known history reported.  Social History: UTA   Tobacco Cessation:  N/A, patient does not currently use tobacco products  Current Medications:   Current Facility-Administered Medications  Medication Dose Route Frequency Provider Last Rate Last Admin   acetaminophen (TYLENOL) tablet 650 mg  650 mg Oral Q6H PRN Ajibola, Ene A, NP       alum & mag hydroxide-simeth (MAALOX/MYLANTA) 200-200-20 MG/5ML suspension 30 mL  30 mL Oral Q4H PRN Ajibola, Ene A, NP       hydrOXYzine (ATARAX) tablet 25 mg  25 mg Oral TID PRN Ajibola, Ene A, NP       magnesium hydroxide (MILK OF MAGNESIA) suspension 30 mL  30 mL Oral Daily PRN Ajibola, Ene A, NP       Current Outpatient Medications  Medication Sig Dispense Refill   hydrOXYzine (ATARAX) 25 MG tablet Take 25 mg by mouth every 8 (eight) hours as needed.     QUEtiapine (SEROQUEL) 25 MG tablet Take 25 mg by mouth at bedtime.     ARIPiprazole (ABILIFY) 15 MG tablet Take 15 mg by mouth daily.     losartan (COZAAR) 50 MG tablet Take 1 tablet (50 mg total) by mouth daily. 30 tablet 0   metoprolol succinate (TOPROL-XL) 25 MG 24 hr tablet Take 1 tablet (25 mg total) by mouth at bedtime. 30 tablet 0    PTA Medications:  Facility Ordered Medications  Medication   acetaminophen (TYLENOL) tablet 650 mg   alum & mag hydroxide-simeth (MAALOX/MYLANTA) 200-200-20 MG/5ML suspension 30 mL   magnesium hydroxide (MILK OF MAGNESIA) suspension 30 mL   hydrOXYzine (ATARAX) tablet 25 mg   PTA Medications  Medication Sig   losartan (COZAAR) 50 MG tablet Take 1 tablet (50 mg total) by mouth daily.   metoprolol succinate (TOPROL-XL) 25 MG 24 hr tablet Take 1 tablet (25 mg total) by mouth at bedtime.       10/05/2022  11:53 AM  Depression screen PHQ 2/9  Decreased Interest 0  Down, Depressed, Hopeless 0  PHQ - 2 Score 0    Flowsheet Row ED from 10/17/2022 in [facility] ED from 10/05/2022 in [facility] ED from 10/02/2022 in [facility]  C-SSRS RISK CATEGORY No Risk No Risk No Risk       Musculoskeletal  Strength & Muscle  Tone: within normal limits Gait & Station: normal Patient leans: N/A  Psychiatric Specialty Exam  Presentation  General Appearance:  Appropriate for Environment  Eye Contact: Fair  Speech: Pressured  Speech Volume: Increased  Handedness: Right   Mood and Affect  Mood: Anxious  Affect: Congruent   Thought Process  Thought Processes: Disorganized; Irrevelant  Descriptions of Associations:Tangential  Orientation:Full (Time, Place and Person)  Thought Content:Scattered; Tangential  Diagnosis of Schizophrenia or Schizoaffective disorder in past: Yes  Duration of Psychotic Symptoms: Greater than six months   Hallucinations:None  Ideas of Reference:Delusions  Suicidal Thoughts:Suicidal Thoughts: No  Homicidal Thoughts:Homicidal Thoughts: No   Sensorium  Memory: Immediate Fair; Recent Poor; Remote Poor  Judgment: Impaired  Insight: Poor   Executive Functions  Concentration: Poor  Attention Span: Poor  Recall: Poor  Fund of Knowledge: Poor  Language: Poor   Psychomotor Activity  Psychomotor Activity: Psychomotor Activity: Normal   Assets  Assets: Desire for Improvement; Physical Health; Leisure Time   Sleep  Sleep: Sleep: Poor   Nutritional Assessment (For OBS and FBC admissions only) Has the patient had a weight loss or gain of 10 pounds or more in the last 3 months?: No Has the patient had a decrease in food intake/or appetite?: No Does the patient have dental problems?: No Does the patient have eating habits or behaviors that may be indicators of an eating disorder including binging or inducing vomiting?: -- (patien was unable to answer) Has the patient recently lost weight without trying?: 0 Has the patient been eating poorly because of a decreased appetite?: -- (patien was unable to answer)    Physical Exam  Physical Exam HENT:     Head: Normocephalic.     Nose: Nose normal.  Eyes:     Conjunctiva/sclera:  Conjunctivae normal.  Cardiovascular:     Rate and Rhythm: Normal rate.  Pulmonary:     Effort: Pulmonary effort is normal.  Musculoskeletal:        General: Normal range of motion.     Cervical back: Normal range of motion.  Neurological:     Mental Status: She is alert.    Review of Systems  Constitutional: Negative.   HENT: Negative.    Eyes: Negative.   Respiratory: Negative.    Cardiovascular: Negative.   Gastrointestinal: Negative.   Genitourinary: Negative.   Musculoskeletal: Negative.   Neurological: Negative.   Endo/Heme/Allergies: Negative.    Blood pressure 110/75, pulse 100, temperature 98.7 F (37.1 C), resp. rate 18, SpO2 96%, unknown if currently breastfeeding. There is no height or weight on file to calculate BMI.   Plan Of Care/Follow-up recommendations:  Patient is recommended for inpatient psychiatric treatment for mood stabilization and medication management.   Disposition: Pt has been ACCEPTED to [facility] Unit. Accepting MD is Dr. Justice Deeds.   Layla Barter, NP 11/08/2022, 12:03 PM

## 2022-11-08 NOTE — ED Notes (Signed)
Patient discharged and sent to [facility] via safe transport. Patient calm and cooperative at time of discharge.

## 2022-11-08 NOTE — Discharge Instructions (Signed)
Pt has been ACCEPTED to [facility] Unit   Accepting MD is Dr. Justice Deeds
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
## 2022-11-08 NOTE — ED Provider Notes (Signed)
[facility] Continuous Assessment Admission H&P  Date: 11/08/22 Patient Name: [name] [name] MRN: [id number] Chief Complaint: "I need to be admitted to [facility] because God want me to be in a hospital."  Diagnoses:  Final diagnoses:  Schizoaffective disorder, bipolar type [facility])    HPI: [name] [name] is a 42 year old female with psychiatric history of schizoaffective disorder, bipolar type.  Patient presented voluntarily to [facility] for mental health evaluation.  On assessment, patient is alert and oriented to self and place.  She was initially whispering and reported that she is unable to speak up because "it is a sin to speak loud."  After a few moments, she started speaking in a clear tone, normal pace and volume. 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 Patient is rambling about different subjects/topics and unable to answer assessment question. She appears to be responding to internal stimuli.   She denies suicidal ideation, homicidal ideation, paranoia, hallucination, and substance use.  Total Time spent with patient: 30 minutes  Musculoskeletal  Strength & Muscle Tone: within normal limits Gait & Station: normal Patient leans: Right  Psychiatric Specialty Exam  Presentation General Appearance:  Appropriate for Environment  Eye Contact: Good  Speech: Clear and Coherent  Speech Volume: Normal  Handedness: Right   Mood and Affect  Mood: Anxious  Affect: Congruent   Thought Process  Thought Processes: Disorganized  Descriptions of Associations:Tangential  Orientation:Full (Time, Place and Person)  Thought Content:Tangential  Diagnosis of Schizophrenia or Schizoaffective disorder in past: Yes  Duration of Psychotic Symptoms: Greater than six  months  Hallucinations:Description of Auditory Hallucinations: hear the voice of God  Ideas of Reference:Delusions  Suicidal Thoughts:Suicidal Thoughts: No  Homicidal Thoughts:Homicidal Thoughts: No   Sensorium  Memory: Remote Poor; Immediate Poor; Recent Poor  Judgment: Impaired  Insight: Lacking   Executive Functions  Concentration: Fair  Attention Span: Fair  Recall: Poor  Fund of Knowledge: Poor  Language: Poor   Psychomotor Activity  Psychomotor Activity: Psychomotor Activity: Normal   Assets  Assets: Physical Health   Sleep  Sleep: Sleep: -- (patien was unable to provide a response")   Nutritional Assessment (For OBS and FBC admissions only) Has the patient had a weight loss or gain of 10 pounds or more in the last 3 months?: No Has the patient had a decrease in food intake/or appetite?: No Does the patient have dental problems?: No Does the patient have eating habits or behaviors that may be indicators of an eating disorder including binging or inducing vomiting?: -- (patien was unable to answer) Has the patient recently lost weight without trying?: 0 Has the patient been eating poorly because of a decreased appetite?: -- (patien was unable to answer)    Physical Exam Vitals and nursing note reviewed.  Constitutional:      General: She is not in acute distress.    Appearance: She is well-developed.  HENT:     Head: Normocephalic and atraumatic.  Eyes:     Conjunctiva/sclera: Conjunctivae normal.  Cardiovascular:     Rate and Rhythm: Normal rate.  Pulmonary:     Effort: Pulmonary effort is normal.  Abdominal:     Palpations: Abdomen is soft.     Tenderness: There is no abdominal tenderness.  Musculoskeletal:        General:  Normal range of motion.     Cervical back: Neck supple.  Neurological:     Mental Status: She is alert and oriented to person, place, and time.  Psychiatric:        Attention and Perception: Attention and  perception normal.        Mood and Affect: Mood normal.        Speech: Speech normal.        Behavior: Behavior normal. Behavior is cooperative.        Thought Content: Thought content normal.    Review of Systems  Constitutional: Negative.   HENT: Negative.    Eyes: Negative.   Respiratory: Negative.    Cardiovascular: Negative.   Gastrointestinal: Negative.   Genitourinary: Negative.   Musculoskeletal: Negative.   Skin: Negative.   Neurological: Negative.   Endo/Heme/Allergies: Negative.   Psychiatric/Behavioral:  The patient is nervous/anxious.     Blood pressure 109/74, pulse (!) 102, temperature 98.8 F (37.1 C), temperature source Oral, resp. rate 18, SpO2 91%, unknown if currently breastfeeding. There is no height or weight on file to calculate BMI.  Past Psychiatric History:  Past Medical History:  Diagnosis Date   Bipolar affective disorder, currently manic, mild (HCC)    Diabetes mellitus without complication (HCC)    Schizophrenia (HCC)       Is the patient at risk to self? No  Has the patient been a risk to self in the past 6 months? No .    Has the patient been a risk to self within the distant past? No   Is the patient a risk to others? No   Has the patient been a risk to others in the past 6 months? No   Has the patient been a risk to others within the distant past? No   Past Medical History:  Past Medical History:  Diagnosis Date   Bipolar affective disorder, currently manic, mild (HCC)    Diabetes mellitus without complication (HCC)    Schizophrenia (HCC)      Family History:  Family History  Problem Relation Age of Onset   Drug abuse Maternal Uncle      Social History:  Social History   Tobacco Use   Smoking status: Never   Smokeless tobacco: Never  Vaping Use   Vaping status: Never Used  Substance Use Topics   Alcohol use: Not Currently   Drug use: Not Currently     Last Labs:  Admission on 10/17/2022, Discharged on 10/18/2022   Component Date Value Ref Range Status   WBC 10/17/2022 8.4  4.0 - 10.5 K/uL Final   RBC 10/17/2022 3.57 (L)  3.87 - 5.11 MIL/uL Final   Hemoglobin 10/17/2022 10.8 (L)  12.0 - 15.0 g/dL Final   HCT 95/62/1308 31.9 (L)  36.0 - 46.0 % Final   MCV 10/17/2022 89.4  80.0 - 100.0 fL Final   MCH 10/17/2022 30.3  26.0 - 34.0 pg Final   MCHC 10/17/2022 33.9  30.0 - 36.0 g/dL Final   RDW 65/78/4696 14.0  11.5 - 15.5 % Final   Platelets 10/17/2022 185  150 - 400 K/uL Final   nRBC 10/17/2022 0.0  0.0 - 0.2 % Final   Neutrophils Relative % 10/17/2022 66  % Final   Neutro Abs 10/17/2022 5.5  1.7 - 7.7 K/uL Final   Lymphocytes Relative 10/17/2022 23  % Final   Lymphs Abs 10/17/2022 1.9  0.7 - 4.0 K/uL Final   Monocytes Relative 10/17/2022 11  %  Final   Monocytes Absolute 10/17/2022 0.9  0.1 - 1.0 K/uL Final   Eosinophils Relative 10/17/2022 0  % Final   Eosinophils Absolute 10/17/2022 0.0  0.0 - 0.5 K/uL Final   Basophils Relative 10/17/2022 0  % Final   Basophils Absolute 10/17/2022 0.0  0.0 - 0.1 K/uL Final   Immature Granulocytes 10/17/2022 0  % Final   Abs Immature Granulocytes 10/17/2022 0.02  0.00 - 0.07 K/uL Final   Performed at [facility], [address] [place], [place] [address]   Sodium 10/17/2022 138  135 - 145 mmol/L Final   Potassium 10/17/2022 4.0  3.5 - 5.1 mmol/L Final   Chloride 10/17/2022 102  98 - 111 mmol/L Final   CO2 10/17/2022 25  22 - 32 mmol/L Final   Glucose, Bld 10/17/2022 145 (H)  70 - 99 mg/dL Final   Glucose reference range applies only to samples taken after fasting for at least 8 hours.   BUN 10/17/2022 19  6 - 20 mg/dL Final   Creatinine, Ser 10/17/2022 0.80  0.44 - 1.00 mg/dL Final   Calcium 60/45/4098 9.3  8.9 - 10.3 mg/dL Final   Total Protein 11/91/4782 8.0  6.5 - 8.1 g/dL Final   Albumin 95/62/1308 3.8  3.5 - 5.0 g/dL Final   AST 65/78/4696 31  15 - 41 U/L Final   ALT 10/17/2022 21  0 - 44 U/L Final   Alkaline Phosphatase 10/17/2022 44  38 - 126  U/L Final   Total Bilirubin 10/17/2022 0.5  0.3 - 1.2 mg/dL Final   GFR, Estimated 10/17/2022 >60  >60 mL/min Final   Comment: (NOTE) Calculated using the CKD-EPI Creatinine Equation (2021)    Anion gap 10/17/2022 11  5 - 15 Final   Performed at [facility], [address], [place], [place] [address]   POC Amphetamine UR 10/18/2022 None Detected  NONE DETECTED (Cut Off Level 1000 ng/mL) Final   POC Secobarbital (BAR) 10/18/2022 None Detected  NONE DETECTED (Cut Off Level 300 ng/mL) Final   POC Buprenorphine (BUP) 10/18/2022 None Detected  NONE DETECTED (Cut Off Level 10 ng/mL) Final   POC Oxazepam (BZO) 10/18/2022 None Detected  NONE DETECTED (Cut Off Level 300 ng/mL) Final   POC Cocaine UR 10/18/2022 None Detected  NONE DETECTED (Cut Off Level 300 ng/mL) Final   POC Methamphetamine UR 10/18/2022 None Detected  NONE DETECTED (Cut Off Level 1000 ng/mL) Final   POC Morphine 10/18/2022 None Detected  NONE DETECTED (Cut Off Level 300 ng/mL) Final   POC Methadone UR 10/18/2022 None Detected  NONE DETECTED (Cut Off Level 300 ng/mL) Final   POC Oxycodone UR 10/18/2022 None Detected  NONE DETECTED (Cut Off Level 100 ng/mL) Final   POC Marijuana UR 10/18/2022 None Detected  NONE DETECTED (Cut Off Level 50 ng/mL) Final   Valproic Acid Lvl 10/17/2022 45 (L)  50.0 - 100.0 ug/mL Final   Performed at [facility], [address] [place], [place] [address]   TSH 10/17/2022 2.837  0.350 - 4.500 uIU/mL Final   Comment: Performed by a 3rd Generation assay with a functional sensitivity of <=0.01 uIU/mL. Performed at [facility], [address], [place], [place] [address]   Admission on 10/05/2022, Discharged on 10/05/2022  Component Date Value Ref Range Status   WBC 10/05/2022 5.7  4.0 - 10.5 K/uL Final   RBC 10/05/2022 3.53 (L)  3.87 - 5.11 MIL/uL Final   Hemoglobin 10/05/2022 10.8 (L)  12.0 - 15.0 g/dL Final   HCT 16/10/9602 32.3 (L)  36.0 - 46.0 % Final   MCV 10/05/2022 91.5  80.0  - 100.0 fL Final   MCH 10/05/2022 30.6  26.0 - 34.0 pg Final   MCHC 10/05/2022 33.4  30.0 - 36.0 g/dL Final   RDW 54/09/8117 13.9  11.5 - 15.5 % Final   Platelets 10/05/2022 233  150 - 400 K/uL Final   nRBC 10/05/2022 0.0  0.0 - 0.2 % Final   Neutrophils Relative % 10/05/2022 56  % Final   Neutro Abs 10/05/2022 3.2  1.7 - 7.7 K/uL Final   Lymphocytes Relative 10/05/2022 33  % Final   Lymphs Abs 10/05/2022 1.9  0.7 - 4.0 K/uL Final   Monocytes Relative 10/05/2022 10  % Final   Monocytes Absolute 10/05/2022 0.6  0.1 - 1.0 K/uL Final   Eosinophils Relative 10/05/2022 1  % Final   Eosinophils Absolute 10/05/2022 0.0  0.0 - 0.5 K/uL Final   Basophils Relative 10/05/2022 0  % Final   Basophils Absolute 10/05/2022 0.0  0.0 - 0.1 K/uL Final   Immature Granulocytes 10/05/2022 0  % Final   Abs Immature Granulocytes 10/05/2022 0.01  0.00 - 0.07 K/uL Final   Performed at [facility], [address], [place], [place] [address]   Sodium 10/05/2022 139  135 - 145 mmol/L Final   Potassium 10/05/2022 4.1  3.5 - 5.1 mmol/L Final   Chloride 10/05/2022 105  98 - 111 mmol/L Final   CO2 10/05/2022 25  22 - 32 mmol/L Final   Glucose, Bld 10/05/2022 113 (H)  70 - 99 mg/dL Final   Glucose reference range applies only to samples taken after fasting for at least 8 hours.   BUN 10/05/2022 8  6 - 20 mg/dL Final   Creatinine, Ser 10/05/2022 0.79  0.44 - 1.00 mg/dL Final   Calcium 95/62/1308 8.9  8.9 - 10.3 mg/dL Final   Total Protein 65/78/4696 7.0  6.5 - 8.1 g/dL Final   Albumin 29/52/8413 3.4 (L)  3.5 - 5.0 g/dL Final   AST 24/40/1027 20  15 - 41 U/L Final   ALT 10/05/2022 19  0 - 44 U/L Final   Alkaline Phosphatase 10/05/2022 42  38 - 126 U/L Final   Total Bilirubin 10/05/2022 0.3  0.3 - 1.2 mg/dL Final   GFR, Estimated 10/05/2022 >60  >60 mL/min Final   Comment: (NOTE) Calculated using the CKD-EPI Creatinine Equation (2021)    Anion gap 10/05/2022 9  5 - 15 Final   Performed at [facility], [address] [place], [place] [address]   Magnesium 10/05/2022 1.9  1.7 - 2.4 mg/dL Final   Performed at [facility], [address], [place], [place] [address]   Alcohol, Ethyl (B) 10/05/2022 <10  <10 mg/dL Final   Comment: (NOTE) Lowest detectable limit for serum alcohol is 10 mg/dL.  For medical purposes only. Performed at [facility], [address], [place], [place] [address]    TSH 10/05/2022 0.963  0.350 - 4.500 uIU/mL Final   Comment: Performed by a 3rd Generation assay with a functional sensitivity of <=0.01 uIU/mL. Performed at [facility], [address], [place], [place] [address]    Prolactin 10/05/2022 76.2 (H)  4.8 - 33.4 ng/mL Final   Comment: (NOTE) Performed At: [facility] [address] [place], [place] [id number] Jolene Schimke MD OA:[id number]    Preg Test,  Ur 10/05/2022 Negative  Negative Final   POC Amphetamine UR 10/05/2022 None Detected  NONE DETECTED (Cut Off Level 1000 ng/mL) Final   POC Secobarbital (BAR) 10/05/2022 None Detected  NONE DETECTED (Cut Off Level 300 ng/mL) Final   POC Buprenorphine (BUP) 10/05/2022 None Detected  NONE DETECTED (Cut Off Level 10 ng/mL) Final   POC Oxazepam (BZO) 10/05/2022 Positive (A)  NONE DETECTED (Cut Off Level 300 ng/mL) Final   POC Cocaine UR 10/05/2022 None Detected  NONE DETECTED (Cut Off Level 300 ng/mL) Final   POC Methamphetamine UR 10/05/2022 None Detected  NONE DETECTED (Cut Off Level 1000 ng/mL) Final   POC Morphine 10/05/2022 None Detected  NONE DETECTED (Cut Off Level 300 ng/mL) Final   POC Methadone UR 10/05/2022 None Detected  NONE DETECTED (Cut Off Level 300 ng/mL) Final   POC Oxycodone UR 10/05/2022 None Detected  NONE DETECTED (Cut Off Level 100 ng/mL) Final   POC Marijuana UR 10/05/2022 None Detected  NONE DETECTED (Cut Off Level 50 ng/mL) Final   Valproic Acid Lvl 10/05/2022 71  50.0 - 100.0 ug/mL Final   Performed at [facility], [address]  [place], [place] [address]   Preg Test, Ur 10/05/2022 NEGATIVE  NEGATIVE Final   Comment:        THE SENSITIVITY OF THIS METHODOLOGY IS >24 mIU/mL   Admission on 09/19/2022, Discharged on 09/29/2022  Component Date Value Ref Range Status   Glucose-Capillary 09/20/2022 123 (H)  70 - 99 mg/dL Final   Glucose reference range applies only to samples taken after fasting for at least 8 hours.   Glucose-Capillary 09/21/2022 124 (H)  70 - 99 mg/dL Final   Glucose reference range applies only to samples taken after fasting for at least 8 hours.   Glucose-Capillary 09/21/2022 152 (H)  70 - 99 mg/dL Final   Glucose reference range applies only to samples taken after fasting for at least 8 hours.   Glucose-Capillary 09/23/2022 131 (H)  70 - 99 mg/dL Final   Glucose reference range applies only to samples taken after fasting for at least 8 hours.   Glucose-Capillary 09/23/2022 160 (H)  70 - 99 mg/dL Final   Glucose reference range applies only to samples taken after fasting for at least 8 hours.   Cholesterol 09/23/2022 163  0 - 200 mg/dL Final   Triglycerides 24/40/1027 97  <150 mg/dL Final   HDL 25/36/6440 52  >40 mg/dL Final   Total CHOL/HDL Ratio 09/23/2022 3.1  RATIO Final   VLDL 09/23/2022 19  0 - 40 mg/dL Final   LDL Cholesterol 09/23/2022 92  0 - 99 mg/dL Final   Comment:        Total Cholesterol/HDL:CHD Risk Coronary Heart Disease Risk Table                     Men   Women  1/2 Average Risk   3.4   3.3  Average Risk       5.0   4.4  2 X Average Risk   9.6   7.1  3 X Average Risk  23.4   11.0        Use the calculated Patient Ratio above and the CHD Risk Table to determine the patient's CHD Risk.        ATP III CLASSIFICATION (LDL):  <100     mg/dL   Optimal  347-425  mg/dL   Near or Above  Optimal  130-159  mg/dL   Borderline  102-725  mg/dL   High  >366     mg/dL   Very High Performed at [facility], [address] [place], [place] [address]     TSH 09/23/2022 0.224 (L)  0.350 - 4.500 uIU/mL Final   Comment: Performed by a 3rd Generation assay with a functional sensitivity of <=0.01 uIU/mL. Performed at [facility], [address] [place], [place] [address]    Glucose-Capillary 09/23/2022 141 (H)  70 - 99 mg/dL Final   Glucose reference range applies only to samples taken after fasting for at least 8 hours.   Glucose-Capillary 09/23/2022 135 (H)  70 - 99 mg/dL Final   Glucose reference range applies only to samples taken after fasting for at least 8 hours.   Glucose-Capillary 09/24/2022 126 (H)  70 - 99 mg/dL Final   Glucose reference range applies only to samples taken after fasting for at least 8 hours.   Glucose-Capillary 09/24/2022 107 (H)  70 - 99 mg/dL Final   Glucose reference range applies only to samples taken after fasting for at least 8 hours.   Glucose-Capillary 09/25/2022 158 (H)  70 - 99 mg/dL Final   Glucose reference range applies only to samples taken after fasting for at least 8 hours.   WBC 09/26/2022 5.6  4.0 - 10.5 K/uL Final   RBC 09/26/2022 3.69 (L)  3.87 - 5.11 MIL/uL Final   Hemoglobin 09/26/2022 11.5 (L)  12.0 - 15.0 g/dL Final   HCT 56/38/7564 34.8 (L)  36.0 - 46.0 % Final   MCV 09/26/2022 94.3  80.0 - 100.0 fL Final   MCH 09/26/2022 31.2  26.0 - 34.0 pg Final   MCHC 09/26/2022 33.0  30.0 - 36.0 g/dL Final   RDW 33/29/5188 13.9  11.5 - 15.5 % Final   Platelets 09/26/2022 187  150 - 400 K/uL Final   nRBC 09/26/2022 0.0  0.0 - 0.2 % Final   Neutrophils Relative % 09/26/2022 49  % Final   Neutro Abs 09/26/2022 2.8  1.7 - 7.7 K/uL Final   Lymphocytes Relative 09/26/2022 39  % Final   Lymphs Abs 09/26/2022 2.2  0.7 - 4.0 K/uL Final   Monocytes Relative 09/26/2022 10  % Final   Monocytes Absolute 09/26/2022 0.5  0.1 - 1.0 K/uL Final   Eosinophils Relative 09/26/2022 2  % Final   Eosinophils Absolute 09/26/2022 0.1  0.0 - 0.5 K/uL Final   Basophils Relative 09/26/2022 0  % Final    Basophils Absolute 09/26/2022 0.0  0.0 - 0.1 K/uL Final   Immature Granulocytes 09/26/2022 0  % Final   Abs Immature Granulocytes 09/26/2022 0.01  0.00 - 0.07 K/uL Final   Performed at [facility], [address] [place], [place] [address]   Glucose-Capillary 09/25/2022 123 (H)  70 - 99 mg/dL Final   Glucose reference range applies only to samples taken after fasting for at least 8 hours.   Glucose-Capillary 09/25/2022 82  70 - 99 mg/dL Final   Glucose reference range applies only to samples taken after fasting for at least 8 hours.   Glucose-Capillary 09/25/2022 136 (H)  70 - 99 mg/dL Final   Glucose reference range applies only to samples taken after fasting for at least 8 hours.   Glucose-Capillary 09/26/2022 128 (H)  70 - 99 mg/dL Final   Glucose reference range applies only to samples taken after fasting for at least 8 hours.  Glucose-Capillary 09/26/2022 143 (H)  70 - 99 mg/dL Final   Glucose reference range applies only to samples taken after fasting for at least 8 hours.   Valproic Acid Lvl 09/27/2022 92  50.0 - 100.0 ug/mL Final   Performed at [facility], [address], [place], [place] [address]   Free T4 09/27/2022 0.71  0.61 - 1.12 ng/dL Final   Comment: (NOTE) Biotin ingestion may interfere with free T4 tests. If the results are inconsistent with the TSH level, previous test results, or the clinical presentation, then consider biotin interference. If needed, order repeat testing after stopping biotin. Performed at [facility], [address], [place], [place] [address]    T3, Total 09/27/2022 86  71 - 180 ng/dL Final   Comment: (NOTE) Performed At: [facility] [address] [place], [place] [id number] Jolene Schimke MD QI:[id number]    TSH 09/27/2022 1.818  0.350 - 4.500 uIU/mL Final   Comment: Performed by a 3rd Generation assay with a functional sensitivity of <=0.01 uIU/mL. Performed at [facility], [address] [place], [place] [address]    Glucose-Capillary 09/26/2022 141 (H)  70 - 99 mg/dL Final   Glucose reference range applies only to samples taken after fasting for at least 8 hours.   Comment 1 09/26/2022 Notify RN   Final   Glucose-Capillary 09/27/2022 142 (H)  70 - 99 mg/dL Final   Glucose reference range applies only to samples taken after fasting for at least 8 hours.   Comment 1 09/27/2022 Notify RN   Final   Glucose-Capillary 09/27/2022 167 (H)  70 - 99 mg/dL Final   Glucose reference range applies only to samples taken after fasting for at least 8 hours.   Comment 1 09/27/2022 Notify RN   Final   Comment 2 09/27/2022 Document in Chart   Final   Glucose-Capillary 09/27/2022 112 (H)  70 - 99 mg/dL Final   Glucose reference range applies only to samples taken after fasting for at least 8 hours.   Glucose-Capillary 09/28/2022 133 (H)  70 - 99 mg/dL Final   Glucose reference range applies only to samples taken after fasting for at least 8 hours.   Glucose-Capillary 09/28/2022 112 (H)  70 - 99 mg/dL Final   Glucose reference range applies only to samples taken after fasting for at least 8 hours.   Glucose-Capillary 09/28/2022 133 (H)  70 - 99 mg/dL Final   Glucose reference range applies only to samples taken after fasting for at least 8 hours.   Glucose-Capillary 09/28/2022 87  70 - 99 mg/dL Final   Glucose reference range applies only to samples taken after fasting for at least 8 hours.   Glucose-Capillary 09/29/2022 118 (H)  70 - 99 mg/dL Final   Glucose reference range applies only to samples taken after fasting for at least 8 hours.  Admission on 09/18/2022, Discharged on 09/19/2022  Component Date Value Ref Range Status   Sodium 09/18/2022 139  135 - 145 mmol/L Final   Potassium 09/18/2022 3.5  3.5 - 5.1 mmol/L Final   Chloride 09/18/2022 104  98 - 111 mmol/L Final   CO2 09/18/2022 23  22 - 32 mmol/L Final   Glucose, Bld 09/18/2022 147 (H)  70 - 99 mg/dL  Final   Glucose reference range applies only to samples taken after fasting for at least 8 hours.   BUN 09/18/2022 10  6 - 20 mg/dL Final   Creatinine, Ser  09/18/2022 0.80  0.44 - 1.00 mg/dL Final   Calcium 82/95/6213 9.6  8.9 - 10.3 mg/dL Final   Total Protein 08/65/7846 8.7 (H)  6.5 - 8.1 g/dL Final   Albumin 96/29/5284 4.3  3.5 - 5.0 g/dL Final   AST 13/24/4010 17  15 - 41 U/L Final   ALT 09/18/2022 13  0 - 44 U/L Final   Alkaline Phosphatase 09/18/2022 43  38 - 126 U/L Final   Total Bilirubin 09/18/2022 0.7  0.3 - 1.2 mg/dL Final   GFR, Estimated 09/18/2022 >60  >60 mL/min Final   Comment: (NOTE) Calculated using the CKD-EPI Creatinine Equation (2021)    Anion gap 09/18/2022 12  5 - 15 Final   Performed at [facility], [address], [place], [place] [address]   Alcohol, Ethyl (B) 09/18/2022 <10  <10 mg/dL Final   Comment: (NOTE) Lowest detectable limit for serum alcohol is 10 mg/dL.  For medical purposes only. Performed at [facility], [address] [place], [place] [address]    Salicylate Lvl 09/18/2022 <7.0 (L)  7.0 - 30.0 mg/dL Final   Performed at [facility], [address], [place], [place] [address]   Acetaminophen (Tylenol), Serum 09/18/2022 <10 (L)  10 - 30 ug/mL Final   Comment: (NOTE) Therapeutic concentrations vary significantly. A range of 10-30 ug/mL  may be an effective concentration for many patients. However, some  are best treated at concentrations outside of this range. Acetaminophen concentrations >150 ug/mL at 4 hours after ingestion  and >50 ug/mL at 12 hours after ingestion are often associated with  toxic reactions.  Performed at [facility], [address], [place], [place] [address]    WBC 09/18/2022 7.0  4.0 - 10.5 K/uL Final   RBC 09/18/2022 3.93  3.87 - 5.11 MIL/uL Final   Hemoglobin 09/18/2022 12.1  12.0 - 15.0 g/dL Final   HCT 87/56/4332 35.9 (L)  36.0 -  46.0 % Final   MCV 09/18/2022 91.3  80.0 - 100.0 fL Final   MCH 09/18/2022 30.8  26.0 - 34.0 pg Final   MCHC 09/18/2022 33.7  30.0 - 36.0 g/dL Final   RDW 95/18/8416 13.5  11.5 - 15.5 % Final   Platelets 09/18/2022 181  150 - 400 K/uL Final   nRBC 09/18/2022 0.0  0.0 - 0.2 % Final   Performed at [facility], [address], [place], [place] [address]   Opiates 09/18/2022 NONE DETECTED  NONE DETECTED Final   Cocaine 09/18/2022 NONE DETECTED  NONE DETECTED Final   Benzodiazepines 09/18/2022 NONE DETECTED  NONE DETECTED Final   Amphetamines 09/18/2022 NONE DETECTED  NONE DETECTED Final   Tetrahydrocannabinol 09/18/2022 NONE DETECTED  NONE DETECTED Final   Barbiturates 09/18/2022 NONE DETECTED  NONE DETECTED Final   Comment: (NOTE) DRUG SCREEN FOR MEDICAL PURPOSES ONLY.  IF CONFIRMATION IS NEEDED FOR ANY PURPOSE, NOTIFY LAB WITHIN 5 DAYS.  LOWEST DETECTABLE LIMITS FOR URINE DRUG SCREEN Drug Class                     Cutoff (ng/mL) Amphetamine and metabolites    1000 Barbiturate and metabolites    200 Benzodiazepine                 200 Opiates and metabolites        300 Cocaine and metabolites        300 THC  50 Performed at [facility], [address], [place], [place] [address]    Preg, Serum 09/18/2022 NEGATIVE  NEGATIVE Final   Comment:        THE SENSITIVITY OF THIS METHODOLOGY IS >10 mIU/mL. Performed at [facility], [address] [place], [place] [address]    Yeast Wet Prep HPF POC 09/18/2022 NONE SEEN  NONE SEEN Final   Trich, Wet Prep 09/18/2022 NONE SEEN  NONE SEEN Final   Clue Cells Wet Prep HPF POC 09/18/2022 NONE SEEN  NONE SEEN Final   WBC, Wet Prep HPF POC 09/18/2022 <10  <10 Final   Sperm 09/18/2022 NONE SEEN   Final   Performed at [facility], [address], [place], [place] [address]   Neisseria Gonorrhea 09/18/2022 Negative   Final   Chlamydia  09/18/2022 Negative   Final   Comment 09/18/2022 Normal Reference Ranger Chlamydia - Negative   Final   Comment 09/18/2022 Normal Reference Range Neisseria Gonorrhea - Negative   Final   HIV Screen 4th Generation wRfx 09/19/2022 Non Reactive  Non Reactive Final   Performed at [facility], [address], [place], [place] [address]   Hgb A1c MFr Bld 09/18/2022 6.9 (H)  4.8 - 5.6 % Final   Comment: (NOTE)         Prediabetes: 5.7 - 6.4         Diabetes: >6.4         Glycemic control for adults with diabetes: <7.0    Mean Plasma Glucose 09/18/2022 151  mg/dL Final   Comment: (NOTE) Performed At: [facility] [address] [place], [place] [id number] Jolene Schimke MD MW:[id number]    Preg Test, Ur 09/19/2022 NEGATIVE  NEGATIVE Final   Comment:        THE SENSITIVITY OF THIS METHODOLOGY IS >25 mIU/mL. Performed at [facility], [address] [place], [place] [address]    Color, Urine 09/19/2022 YELLOW  YELLOW Final   APPearance 09/19/2022 HAZY (A)  CLEAR Final   Specific Gravity, Urine 09/19/2022 1.010  1.005 - 1.030 Final   pH 09/19/2022 5.0  5.0 - 8.0 Final   Glucose, UA 09/19/2022 NEGATIVE  NEGATIVE mg/dL Final   Hgb urine dipstick 09/19/2022 NEGATIVE  NEGATIVE Final   Bilirubin Urine 09/19/2022 NEGATIVE  NEGATIVE Final   Ketones, ur 09/19/2022 5 (A)  NEGATIVE mg/dL Final   Protein, ur 42/59/5638 NEGATIVE  NEGATIVE mg/dL Final   Nitrite 75/64/3329 NEGATIVE  NEGATIVE Final   Leukocytes,Ua 09/19/2022 SMALL (A)  NEGATIVE Final   RBC / HPF 09/19/2022 0-5  0 - 5 RBC/hpf Final   WBC, UA 09/19/2022 0-5  0 - 5 WBC/hpf Final   Bacteria, UA 09/19/2022 NONE SEEN  NONE SEEN Final   Squamous Epithelial / HPF 09/19/2022 0-5  0 - 5 /HPF Final   Mucus 09/19/2022 PRESENT   Final   Hyaline Casts, UA 09/19/2022 PRESENT   Final   Performed at [facility], [address], [place], [place] [address]   Glucose-Capillary 09/19/2022 107 (H)  70  - 99 mg/dL Final   Glucose reference range applies only to samples taken after fasting for at least 8 hours.   Glucose-Capillary 09/19/2022 123 (H)  70 - 99 mg/dL Final   Glucose reference range applies only to samples taken after fasting for at least 8 hours.   Glucose-Capillary 09/19/2022 98  70 - 99 mg/dL Final   Glucose reference  range applies only to samples taken after fasting for at least 8 hours.   Glucose-Capillary 09/19/2022 134 (H)  70 - 99 mg/dL Final   Glucose reference range applies only to samples taken after fasting for at least 8 hours.  Admission on 08/04/2022, Discharged on 08/06/2022  Component Date Value Ref Range Status   Sodium 08/04/2022 139  135 - 145 mmol/L Final   Potassium 08/04/2022 3.5  3.5 - 5.1 mmol/L Final   Chloride 08/04/2022 101  98 - 111 mmol/L Final   CO2 08/04/2022 25  22 - 32 mmol/L Final   Glucose, Bld 08/04/2022 91  70 - 99 mg/dL Final   Glucose reference range applies only to samples taken after fasting for at least 8 hours.   BUN 08/04/2022 11  6 - 20 mg/dL Final   Creatinine, Ser 08/04/2022 0.95  0.44 - 1.00 mg/dL Final   Calcium 40/98/1191 9.1  8.9 - 10.3 mg/dL Final   Total Protein 47/82/9562 7.4  6.5 - 8.1 g/dL Final   Albumin 13/08/6576 3.7  3.5 - 5.0 g/dL Final   AST 46/96/2952 29  15 - 41 U/L Final   ALT 08/04/2022 17  0 - 44 U/L Final   Alkaline Phosphatase 08/04/2022 43  38 - 126 U/L Final   Total Bilirubin 08/04/2022 1.1  0.3 - 1.2 mg/dL Final   GFR, Estimated 08/04/2022 >60  >60 mL/min Final   Comment: (NOTE) Calculated using the CKD-EPI Creatinine Equation (2021)    Anion gap 08/04/2022 13  5 - 15 Final   Performed at [facility], [address], [place], [place] [address]   Alcohol, Ethyl (B) 08/04/2022 <10  <10 mg/dL Final   Comment: (NOTE) Lowest detectable limit for serum alcohol is 10 mg/dL.  For medical purposes only. Performed at [facility], [address], [place], [place] [address]    Opiates  08/06/2022 NONE DETECTED  NONE DETECTED Final   Cocaine 08/06/2022 NONE DETECTED  NONE DETECTED Final   Benzodiazepines 08/06/2022 POSITIVE (A)  NONE DETECTED Final   Amphetamines 08/06/2022 NONE DETECTED  NONE DETECTED Final   Tetrahydrocannabinol 08/06/2022 NONE DETECTED  NONE DETECTED Final   Barbiturates 08/06/2022 NONE DETECTED  NONE DETECTED Final   Comment: (NOTE) DRUG SCREEN FOR MEDICAL PURPOSES ONLY.  IF CONFIRMATION IS NEEDED FOR ANY PURPOSE, NOTIFY LAB WITHIN 5 DAYS.  LOWEST DETECTABLE LIMITS FOR URINE DRUG SCREEN Drug Class                     Cutoff (ng/mL) Amphetamine and metabolites    1000 Barbiturate and metabolites    200 Benzodiazepine                 200 Opiates and metabolites        300 Cocaine and metabolites        300 THC                            50 Performed at [facility], [address] [place], [place] [address]    WBC 08/04/2022 6.3  4.0 - 10.5 K/uL Final   RBC 08/04/2022 3.47 (L)  3.87 - 5.11 MIL/uL Final   Hemoglobin 08/04/2022 10.6 (L)  12.0 - 15.0 g/dL Final   HCT 66/44/0347 32.5 (L)  36.0 - 46.0 % Final   MCV 08/04/2022 93.7  80.0 - 100.0 fL Final   MCH 08/04/2022 30.5  26.0 - 34.0 pg Final   MCHC 08/04/2022 32.6  30.0 - 36.0 g/dL Final   RDW 24/40/1027 13.6  11.5 - 15.5 % Final   Platelets 08/04/2022 191  150 - 400 K/uL Final   nRBC 08/04/2022 0.0  0.0 - 0.2 % Final   Neutrophils Relative % 08/04/2022 53  % Final   Neutro Abs 08/04/2022 3.3  1.7 - 7.7 K/uL Final   Lymphocytes Relative 08/04/2022 30  % Final   Lymphs Abs 08/04/2022 1.9  0.7 - 4.0 K/uL Final   Monocytes Relative 08/04/2022 16  % Final   Monocytes Absolute 08/04/2022 1.0  0.1 - 1.0 K/uL Final   Eosinophils Relative 08/04/2022 1  % Final   Eosinophils Absolute 08/04/2022 0.0  0.0 - 0.5 K/uL Final   Basophils Relative 08/04/2022 0  % Final   Basophils Absolute 08/04/2022 0.0  0.0 - 0.1 K/uL Final   Immature Granulocytes 08/04/2022 0  % Final   Abs Immature  Granulocytes 08/04/2022 0.01  0.00 - 0.07 K/uL Final   Performed at [facility], [address] [place], [place] [address]   Preg, Serum 08/04/2022 NEGATIVE  NEGATIVE Final   Comment:        THE SENSITIVITY OF THIS METHODOLOGY IS >10 mIU/mL. Performed at [facility], [address] [place], [place] [address]    Salicylate Lvl 08/04/2022 <7.0 (L)  7.0 - 30.0 mg/dL Final   Performed at [facility], [address] [place], [place] [address]   Acetaminophen (Tylenol), Serum 08/04/2022 <10 (L)  10 - 30 ug/mL Final   Comment: (NOTE) Therapeutic concentrations vary significantly. A range of 10-30 ug/mL  may be an effective concentration for many patients. However, some  are best treated at concentrations outside of this range. Acetaminophen concentrations >150 ug/mL at 4 hours after ingestion  and >50 ug/mL at 12 hours after ingestion are often associated with  toxic reactions.  Performed at [facility], [address], [place], [place] [address]    Valproic Acid Lvl 08/04/2022 34 (L)  50.0 - 100.0 ug/mL Final   Performed at [facility], [address], [place], [place] [address]   TSH 08/04/2022 0.489  0.350 - 4.500 uIU/mL Final   Comment: Performed by a 3rd Generation assay with a functional sensitivity of <=0.01 uIU/mL. Performed at [facility], [address] [place], [place] [address]    Free T4 08/04/2022 1.07  0.61 - 1.12 ng/dL Final   Comment: (NOTE) Biotin ingestion may interfere with free T4 tests. If the results are inconsistent with the TSH level, previous test results, or the clinical presentation, then consider biotin interference. If needed, order repeat testing after stopping biotin. Performed at [facility], [address], [place], [place] [address]   Admission on 07/24/2022, Discharged on 07/24/2022  Component Date Value Ref Range Status   Sodium 07/24/2022 133 (L)  135 - 145 mmol/L Final   Potassium 07/24/2022  3.5  3.5 - 5.1 mmol/L Final   Chloride 07/24/2022 102  98 - 111 mmol/L Final   CO2 07/24/2022 23  22 - 32 mmol/L Final   Glucose, Bld 07/24/2022 130 (H)  70 - 99 mg/dL Final   Glucose reference range applies only to samples taken after fasting for at least 8 hours.   BUN 07/24/2022 8  6 - 20 mg/dL Final   Creatinine, Ser 07/24/2022 0.90  0.44 - 1.00 mg/dL Final  Calcium 07/24/2022 9.0  8.9 - 10.3 mg/dL Final   Total Protein 01/14/3233 7.7  6.5 - 8.1 g/dL Final   Albumin 57/32/2025 3.8  3.5 - 5.0 g/dL Final   AST 42/70/6237 15  15 - 41 U/L Final   ALT 07/24/2022 12  0 - 44 U/L Final   Alkaline Phosphatase 07/24/2022 39  38 - 126 U/L Final   Total Bilirubin 07/24/2022 0.7  0.3 - 1.2 mg/dL Final   GFR, Estimated 07/24/2022 >60  >60 mL/min Final   Comment: (NOTE) Calculated using the CKD-EPI Creatinine Equation (2021)    Anion gap 07/24/2022 8  5 - 15 Final   Performed at [facility], [address], [place], [place] [address]   Alcohol, Ethyl (B) 07/24/2022 <10  <10 mg/dL Final   Comment: (NOTE) Lowest detectable limit for serum alcohol is 10 mg/dL.  For medical purposes only. Performed at [facility], [address] [place], [place] [address]    Salicylate Lvl 07/24/2022 <7.0 (L)  7.0 - 30.0 mg/dL Final   Performed at [facility], [address], [place], [place] [address]   Acetaminophen (Tylenol), Serum 07/24/2022 <10 (L)  10 - 30 ug/mL Final   Comment: (NOTE) Therapeutic concentrations vary significantly. A range of 10-30 ug/mL  may be an effective concentration for many patients. However, some  are best treated at concentrations outside of this range. Acetaminophen concentrations >150 ug/mL at 4 hours after ingestion  and >50 ug/mL at 12 hours after ingestion are often associated with  toxic reactions.  Performed at [facility], [address], [place], [place] [address]    WBC 07/24/2022 4.0  4.0 - 10.5 K/uL Final   RBC 07/24/2022 3.94  3.87  - 5.11 MIL/uL Final   Hemoglobin 07/24/2022 12.6  12.0 - 15.0 g/dL Final   HCT 94/85/4627 37.0  36.0 - 46.0 % Final   MCV 07/24/2022 93.9  80.0 - 100.0 fL Final   MCH 07/24/2022 32.0  26.0 - 34.0 pg Final   MCHC 07/24/2022 34.1  30.0 - 36.0 g/dL Final   RDW 03/50/0938 13.3  11.5 - 15.5 % Final   Platelets 07/24/2022 195  150 - 400 K/uL Final   nRBC 07/24/2022 0.0  0.0 - 0.2 % Final   Performed at [facility], [address], [place], [place] [address]   Preg, Serum 07/24/2022 NEGATIVE  NEGATIVE Final   Comment:        THE SENSITIVITY OF THIS METHODOLOGY IS >10 mIU/mL. Performed at [facility], [address], [place], [place] [address]     Allergies: Neurontin [gabapentin], Trazodone and nefazodone, Fluphenazine, and Haldol [haloperidol]  Medications:  Facility Ordered Medications  Medication   acetaminophen (TYLENOL) tablet 650 mg   alum & mag hydroxide-simeth (MAALOX/MYLANTA) 200-200-20 MG/5ML suspension 30 mL   magnesium hydroxide (MILK OF MAGNESIA) suspension 30 mL   hydrOXYzine (ATARAX) tablet 25 mg   PTA Medications  Medication Sig   losartan (COZAAR) 50 MG tablet Take 1 tablet (50 mg total) by mouth daily.   metoprolol succinate (TOPROL-XL) 25 MG 24 hr tablet Take 1 tablet (25 mg total) by mouth at bedtime.   QUEtiapine (SEROQUEL XR) 300 MG 24 hr tablet Take 2 tablets (600 mg total) by mouth at bedtime.   metFORMIN (GLUCOPHAGE-XR) 500 MG 24 hr tablet Take 1 tablet (500 mg total) by mouth 2 (two) times daily with a meal.   divalproex (DEPAKOTE  ER) 500 MG 24 hr tablet Take 2 tablets (1,000 mg total) by mouth daily.   paliperidone (INVEGA SUSTENNA) 234 MG/1.5ML injection Inject 234 mg into the muscle once for 1 dose. Administer on 10-27-22. Patient received last dose on 09-27-22      Medical Decision Making  Patient recommended for inpatient psychiatric admission for stabilization. Per chart, In vega 134 IM next dose due 10/27/2022 (it is unclear if patient  received this dose)  -Valproic acid level--pending, will resume Depakote pending lab level  Resume home medication: Seroquel 600 mg/night, metoprolol 25 mg/day, metformin 500mg /BID, losartan 50mg /day, Depakote 1000 mg/day (pending valproic acid lab result )  Recommendations  Based on my evaluation the patient does not appear to have an emergency medical condition.  Maricela Bo, NP 11/08/22  5:21 AM

## 2022-11-08 NOTE — Progress Notes (Signed)
[facility] LCSW Progress Note   11/08/2022    10:07 AM  [name]   [phone]   Type of Contact and Topic:  Psychiatric Bed Placement   Pt accepted to [facility] [address]   Patient meets inpatient criteria per Liborio Nixon, NP   The attending provider will be Dr. Andres Shad  Call report to [phone]   Kristine Royal, RN @ [facility] notified.     Pt scheduled  to arrive at [facility] TODAY.    Damita Dunnings, MSW, LCSW-A  10:11 AM 11/08/2022

## 2022-11-08 NOTE — ED Notes (Signed)
Per [facility], pt is refusing admission bloodwork. Pt also went to use the bathroom outside assessment room and decided to get in the shower. Pt given items to wash since she was already in the water.
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
## 2022-11-08 NOTE — ED Notes (Signed)
Patient laying in bed quoting scriptures out of the bible. No acute safety concerns at this time.

## 2022-11-08 NOTE — Progress Notes (Signed)
Patient has been denied by [facility] due to no appropriate beds available. Patient meets BH inpatient criteria per Liborio Nixon, NP. Patient has been faxed out to the following facilities:  [facility]  [address] [place] [place] [address] [phone]  [facility]  [address] [place] [address] [phone]  [facility] [address]  [place] [address] [phone]  [facility]  [address] [place] [address] [phone]  [facility]  [address], [place] [address] [phone]  [facility]  [address] [place] [address] [phone]  [facility]  [address], [place] [address] [phone]  [facility]  [address] [place] [address] [phone]  [facility]  [address] [place] [address] [phone]  [facility] Patient Placement  [facility], [place] [phone]  [facility]  [address] [place], [place] [address] [phone]  [facility]  [address] [place], [place] [address] [phone]  [facility]  [address] [place] [address] [phone]  [facility]  [address], [place] [address] [phone]  [facility]  [address] [place] [address] [phone]   Damita Dunnings, MSW, LCSW-A  9:39 AM 11/08/2022
